# Patient Record
Sex: Male | Born: 1945
Health system: Southern US, Community
[De-identification: ages and names within clinical notes are randomized; demographics above are authoritative.]

## PROBLEM LIST (undated history)

## (undated) DIAGNOSIS — G4730 Sleep apnea, unspecified: Secondary | ICD-10-CM

## (undated) DIAGNOSIS — I10 Essential (primary) hypertension: Secondary | ICD-10-CM

## (undated) DIAGNOSIS — R011 Cardiac murmur, unspecified: Secondary | ICD-10-CM

## (undated) DIAGNOSIS — M1990 Unspecified osteoarthritis, unspecified site: Secondary | ICD-10-CM

## (undated) DIAGNOSIS — K219 Gastro-esophageal reflux disease without esophagitis: Secondary | ICD-10-CM

## (undated) DIAGNOSIS — I2510 Atherosclerotic heart disease of native coronary artery without angina pectoris: Secondary | ICD-10-CM

## (undated) DIAGNOSIS — E119 Type 2 diabetes mellitus without complications: Secondary | ICD-10-CM

## (undated) DIAGNOSIS — C9000 Multiple myeloma not having achieved remission: Secondary | ICD-10-CM

## (undated) DIAGNOSIS — E7800 Pure hypercholesterolemia, unspecified: Secondary | ICD-10-CM

## (undated) HISTORY — PX: TONSILLECTOMY: SUR1361

## (undated) HISTORY — PX: CORONARY ANGIOPLASTY: SHX604

---

## 1971-10-21 HISTORY — PX: SHOULDER SURGERY: SHX246

## 2006-09-27 ENCOUNTER — Emergency Department (HOSPITAL_COMMUNITY): Admission: AD | Admit: 2006-09-27 | Discharge: 2006-09-27 | Payer: Self-pay | Admitting: Family Medicine

## 2010-11-03 ENCOUNTER — Emergency Department (HOSPITAL_COMMUNITY)
Admission: EM | Admit: 2010-11-03 | Discharge: 2010-11-03 | Payer: Self-pay | Source: Home / Self Care | Admitting: Emergency Medicine

## 2010-11-04 LAB — URINALYSIS, ROUTINE W REFLEX MICROSCOPIC
Bilirubin Urine: NEGATIVE
Hgb urine dipstick: NEGATIVE
Ketones, ur: NEGATIVE mg/dL
Nitrite: NEGATIVE
Protein, ur: NEGATIVE mg/dL
Specific Gravity, Urine: 1.042 — ABNORMAL HIGH (ref 1.005–1.030)
Urine Glucose, Fasting: NEGATIVE mg/dL
Urobilinogen, UA: 0.2 mg/dL (ref 0.0–1.0)
pH: 5 (ref 5.0–8.0)

## 2010-11-04 LAB — POCT I-STAT, CHEM 8
BUN: 28 mg/dL — ABNORMAL HIGH (ref 6–23)
Calcium, Ion: 1.09 mmol/L — ABNORMAL LOW (ref 1.12–1.32)
Chloride: 112 mEq/L (ref 96–112)
Creatinine, Ser: 1.2 mg/dL (ref 0.4–1.2)
Glucose, Bld: 126 mg/dL — ABNORMAL HIGH (ref 70–99)
HCT: 45 % (ref 36.0–46.0)
Hemoglobin: 15.3 g/dL — ABNORMAL HIGH (ref 12.0–15.0)
Potassium: 4.3 mEq/L (ref 3.5–5.1)
Sodium: 139 mEq/L (ref 135–145)
TCO2: 19 mmol/L (ref 0–100)

## 2010-11-04 LAB — CBC
HCT: 41.9 % (ref 36.0–46.0)
Hemoglobin: 14.5 g/dL (ref 12.0–15.0)
MCH: 31.8 pg (ref 26.0–34.0)
MCHC: 34.6 g/dL (ref 30.0–36.0)
MCV: 91.9 fL (ref 78.0–100.0)
Platelets: 180 10*3/uL (ref 150–400)
RBC: 4.56 MIL/uL (ref 3.87–5.11)
RDW: 14.4 % (ref 11.5–15.5)
WBC: 5.6 10*3/uL (ref 4.0–10.5)

## 2010-11-04 LAB — TYPE AND SCREEN
ABO/RH(D): B POS
Antibody Screen: NEGATIVE

## 2010-11-04 LAB — PROTIME-INR
INR: 1.09 (ref 0.00–1.49)
Prothrombin Time: 14.3 seconds (ref 11.6–15.2)

## 2010-11-04 LAB — ABO/RH: ABO/RH(D): B POS

## 2015-07-08 ENCOUNTER — Emergency Department (HOSPITAL_COMMUNITY)
Admission: EM | Admit: 2015-07-08 | Discharge: 2015-07-08 | Disposition: A | Discharge status: Home / Self Care | Payer: Medicare HMO | Attending: Emergency Medicine | Admitting: Emergency Medicine

## 2015-07-08 ENCOUNTER — Encounter (HOSPITAL_COMMUNITY): Payer: Self-pay | Admitting: *Deleted

## 2015-07-08 DIAGNOSIS — I10 Essential (primary) hypertension: Secondary | ICD-10-CM | POA: Diagnosis not present

## 2015-07-08 DIAGNOSIS — K047 Periapical abscess without sinus: Secondary | ICD-10-CM | POA: Insufficient documentation

## 2015-07-08 DIAGNOSIS — E119 Type 2 diabetes mellitus without complications: Secondary | ICD-10-CM | POA: Diagnosis not present

## 2015-07-08 DIAGNOSIS — K088 Other specified disorders of teeth and supporting structures: Secondary | ICD-10-CM | POA: Diagnosis present

## 2015-07-08 HISTORY — DX: Essential (primary) hypertension: I10

## 2015-07-08 MED ORDER — PENICILLIN V POTASSIUM 500 MG PO TABS: 500.0000 mg | ORAL_TABLET | Freq: Three times a day (TID) | ORAL | Status: DC

## 2015-07-08 MED ORDER — HYDROCODONE-ACETAMINOPHEN 5-325 MG PO TABS: 1.0000 | ORAL_TABLET | ORAL | Status: DC | PRN

## 2015-07-08 MED ORDER — HYDROCODONE-ACETAMINOPHEN 5-325 MG PO TABS
1.0000 | ORAL_TABLET | Freq: Once | ORAL | Status: AC
  Administered 2015-07-08: 1 via ORAL
  Filled 2015-07-08: qty 1

## 2015-07-08 MED ORDER — PENICILLIN V POTASSIUM 250 MG PO TABS
500.0000 mg | ORAL_TABLET | Freq: Once | ORAL | Status: AC
  Administered 2015-07-08: 500 mg via ORAL
  Filled 2015-07-08: qty 2

## 2015-07-08 NOTE — ED Notes (Signed)
Pt c/o right jaw pain since last night. States he has a broken tooth on the right lower jaw. States he took ibuprofen, swished with colgate peroxide with no relief.

## 2015-07-08 NOTE — Discharge Instructions (Signed)
Dental Abscess °A dental abscess is a collection of infected fluid (pus) from a bacterial infection in the inner part of the tooth (pulp). It usually occurs at the end of the tooth's root.  °CAUSES  °· Severe tooth decay. °· Trauma to the tooth that allows bacteria to enter into the pulp, such as a broken or chipped tooth. °SYMPTOMS  °· Severe pain in and around the infected tooth. °· Swelling and redness around the abscessed tooth or in the mouth or face. °· Tenderness. °· Pus drainage. °· Bad breath. °· Bitter taste in the mouth. °· Difficulty swallowing. °· Difficulty opening the mouth. °· Nausea. °· Vomiting. °· Chills. °· Swollen neck glands. °DIAGNOSIS  °· A medical and dental history will be taken. °· An examination will be performed by tapping on the abscessed tooth. °· X-rays may be taken of the tooth to identify the abscess. °TREATMENT °The goal of treatment is to eliminate the infection. You may be prescribed antibiotic medicine to stop the infection from spreading. A root canal may be performed to save the tooth. If the tooth cannot be saved, it may be pulled (extracted) and the abscess may be drained.  °HOME CARE INSTRUCTIONS °· Only take over-the-counter or prescription medicines for pain, fever, or discomfort as directed by your caregiver. °· Rinse your mouth (gargle) often with salt water (¼ tsp salt in 8 oz [250 ml] of warm water) to relieve pain or swelling. °· Do not drive after taking pain medicine (narcotics). °· Do not apply heat to the outside of your face. °· Return to your dentist for further treatment as directed. °SEEK MEDICAL CARE IF: °· Your pain is not helped by medicine. °· Your pain is getting worse instead of better. °SEEK IMMEDIATE MEDICAL CARE IF: °· You have a fever or persistent symptoms for more than 2-3 days. °· You have a fever and your symptoms suddenly get worse. °· You have chills or a very bad headache. °· You have problems breathing or swallowing. °· You have trouble  opening your mouth. °· You have swelling in the neck or around the eye. °Document Released: 10/06/2005 Document Revised: 06/30/2012 Document Reviewed: 01/14/2011 °ExitCare® Patient Information ©2015 [employer]. This information is not intended to replace advice given to you by your health care provider. Make sure you discuss any questions you have with your health care provider. ° °Emergency Department Resource Guide °1) Find a Doctor and Pay Out of Pocket °Although you won't have to find out who is covered by your insurance plan, it is a good idea to ask around and get recommendations. You will then need to call the office and see if the doctor you have chosen will accept you as a new patient and what types of options they offer for patients who are self-pay. Some doctors offer discounts or will set up payment plans for their patients who do not have insurance, but you will need to ask so you aren't surprised when you get to your appointment. ° °2) Contact Your Local Health Department °Not all health departments have doctors that can see patients for sick visits, but many do, so it is worth a call to see if yours does. If you don't know where your local health department is, you can check in your phone book. The CDC also has a tool to help you locate your state's health department, and many state websites also have listings of all of their local health departments. ° °3) Find a Walk-in Clinic °  If your illness is not likely to be very severe or complicated, you may want to try a walk in clinic. These are popping up all over the country in pharmacies, drugstores, and shopping centers. They're usually staffed by nurse practitioners or physician assistants that have been trained to treat common illnesses and complaints. They're usually fairly quick and inexpensive. However, if you have serious medical issues or chronic medical problems, these are probably not your best option. ° °No Primary Care Doctor: °- Call  [facility] at  832-8000 - they can help you locate a primary care doctor that  accepts your insurance, provides certain services, etc. °- Physician Referral Service- 1-800-533-3463 ° °Chronic Pain Problems: °Organization         Address  Phone   Notes  °[facility]  ([phone]) [phone] Patients need to be referred by their primary care doctor.  ° °Medication Assistance: °Organization         Address  Phone   Notes  °[place] [facility] [address] °[place], [place] [address] (336) 641-8030 --Must be a resident of [place] °-- Must have NO insurance coverage whatsoever (no Medicaid/ Medicare, etc.) °-- The pt. MUST have a primary care doctor that directs their care regularly and follows them in the community °  °[facility]  (866) 331-1348   °[employer]  (888) 892-1162   ° °Agencies that provide inexpensive medical care: °Organization         Address  Phone   Notes  °[facility]  (336) 832-8035   °[facility]    (336) 832-7272   °[facility] [address] °[place], [place] [address] (336) 832-4777   °[facility] [address], °[place] (336) 271-4999   °[facility]    (336) 373-0678   °[facility]    (336) 272-1050   °[facility] ° [address], [place] Phone:  (336) 832-4444, Fax:  (336) 832-4440 Hours of Operation:  9 am - 6 pm, M-F.  Also accepts Medicaid/Medicare and self-pay.  °[facility] ° [address], [place] Phone: (336) [phone], Fax: (336) [phone]. Hours of Operation:  8:30 am - 5:30 pm, M-F.  Also accepts Medicaid and self-pay.  °[facility] [place] [address], [place] Phone: (336) 878-6027   °[facility] [address], [place], [place] (336)723-1848, Ext. 123 Mondays & Thursdays: 7-9 AM.  First 15 patients are seen on a first come, first serve  basis. °  ° °Medicaid-accepting [place] Providers: ° °Organization         Address  Phone   Notes  °[facility] [address], [place] (336) [phone] Also accepts self-pay patients.  °[facility] [address], [place] ° (336) [phone]   °[facility] [address], [place] (336) 288-8857   °[facility] [address] [place] [address], [place] (336) 299-7000   °[facility] [address], [place]  ° (336) [phone] Only accepts [place] Access Medicaid patients after they have their name applied to their card.  ° °Self-Pay (no insurance) in [place]: ° °Organization         Address  Phone   Notes  °Sickle Cell Patients, [facility] [address], [place] (336)   [phone]   °[facility] [address], [place] (336) [phone]   °[facility] ° [address], [place] (336) [phone]   °[facility]/Dr. Osei-Bonsu ° [address] [place] [address], [place] or [address], [place] (336) [phone] Phone number for both [place] and [place] locations is the same.  °[facility] [address], [place] (336) [phone]   °[facility] [address] [place] [address], [place] or [address] (336) 852-7530 °(336) 878-2260   °[facility] [address], [place] (336) 350-1642, phone; (336) 294-5005, fax Sees patients 1st and 3rd Saturday of every month.  Must not qualify for public or private insurance (i.e. Medicaid, Medicare, [place] Health Choice, Veterans' Benefits) • Household income should be no more than 200% of the poverty level •The clinic cannot treat you if you are pregnant or think you are pregnant • Sexually transmitted diseases are not treated at the clinic.  ° ° °Dental Care: °Organization         Address  Phone  Notes  °[place]  [facility] [address], [place] (336) 641-6152 Accepts children up to age 21 who are enrolled in Medicaid or [name] Health Choice; pregnant women with a Medicaid card; and children who have applied for Medicaid or [place] Health Choice, but were declined, whose parents can pay a reduced fee at time of service.  °[facility] [place]  [address], [place] (336) 641-7733 Accepts children up to age 21 who are enrolled in Medicaid or [place] Health Choice; pregnant women with a Medicaid card; and children who have applied for Medicaid or [place] Health Choice, but were declined, whose parents can pay a reduced fee at time of service.  °[facility] ° [address], [place] (336) 641-4533 Patients are seen by appointment only. Walk-ins are not accepted. [facility] will see patients 18 years of age and older. °Monday - Tuesday (8am-5pm) °Most Wednesdays (8:30-5pm) °$30 per visit, cash only  °[facility] ° [address], [place] (336) [phone] Patients are seen by appointment only. Walk-ins are not accepted. [facility] will see patients 18 years of age and older. °One Wednesday Evening (Monthly: Volunteer Based).  $30 per visit, cash only  °[facility]  (919) 537-3737 for adults; Children under age 4, call Graduate Pediatric Dentistry at (919) [phone]. Children aged 4-14, please call ([phone]) [phone] to request a pediatric application. ° Dental services are provided in all areas of dental care including fillings, crowns and bridges, complete and partial dentures, implants, gum treatment, root canals, and extractions. Preventive care is also provided. Treatment is provided to both adults and children. °Patients are selected via a lottery and there is often a waiting list. °  °[facility] [address], ° ° (336) 763-8833  [email or web address].[email or web address] °  °[facility] [address], [place], [place] (336)723-1848, Ext. 123 Second and Fourth Thursday of each month, opens at 6:30 AM; Clinic ends at 9 AM.  Patients are seen on a first-come first-served basis, and a limited number are seen during each clinic.  ° °[facility] ° [address], [place], [place] (336) [phone]   Eligibility Requirements °You must have lived in [place], [place], or [place] counties for   at least the last three months. °  You cannot be eligible for state or federal sponsored healthcare insurance, including Veterans Administration, Medicaid, or Medicare. °  You generally cannot be eligible for healthcare insurance through your employer.  °  How to apply: °Eligibility screenings are held every Tuesday and Wednesday afternoon from 1:00 pm until 4:00 pm. You do not need an appointment for the interview!  °[facility] [address], [place], [place] [phone]   °[facility]  [phone]   °[facility]  [phone]   °[facility]  [phone]   ° °

## 2015-07-08 NOTE — ED Provider Notes (Signed)
CSN: [id number]     Arrival date & time 07/08/15  2109 History  This chart was scribed for non-physician practitioner working with Charlesetta Shanks, MD by Ludger Nutting, ED Scribe. This patient was seen in room TR07C/TR07C and the patient's care was started at 9:46 PM.    Chief Complaint  Patient presents with  . Dental Pain    The history is provided by the patient. No language interpreter was used.     HPI Comments: Richard Vang is a 69 y.o. male with past medical history of DM who presents to the Emergency Department complaining of 1 day of gradual onset, gradually worsening, constant right lower dental pain with associated right facial swelling. He reports having a painful tooth to the affected area. He has taken ibuprofen and swished with H2O2 without relief. He denies fever, chills, discharge, drainage.   Past Medical History  Diagnosis Date  . Hypertension   . Diabetes mellitus without complication    Past Surgical History  Procedure Laterality Date  . Shoulder surgery     No family history on file. Social History  Substance Use Topics  . Smoking status: Never Smoker   . Smokeless tobacco: None  . Alcohol Use: No    Review of Systems  Constitutional: Negative for fever and chills.  HENT: Positive for dental problem and facial swelling. Negative for trouble swallowing.   Gastrointestinal: Negative for nausea.  Musculoskeletal: Negative for neck pain.  Neurological: Negative for headaches.    Allergies  Review of patient's allergies indicates no known allergies.  Home Medications   Prior to Admission medications   Not on File   BP 142/80 mmHg  Pulse 96  Temp(Src) 98.9 F (37.2 C) (Oral)  Resp 18  Ht 5\' 9"  (1.753 m)  Wt 233 lb (105.688 kg)  BMI 34.39 kg/m2  SpO2 95% Physical Exam  Constitutional: He is oriented to person, place, and time. He appears well-developed and well-nourished.  HENT:  Head: Normocephalic and atraumatic.  Mouth/Throat: Abnormal  dentition.  Right facial swelling along the mandibular line. Widespread dental decay. No pointing abscess. Oropharynx benign.   Cardiovascular: Normal rate.   Pulmonary/Chest: Effort normal.  Abdominal: He exhibits no distension.  Neurological: He is alert and oriented to person, place, and time.  Skin: Skin is warm and dry.  Psychiatric: He has a normal mood and affect.  Nursing note and vitals reviewed.   ED Course  Procedures (including critical care time)  DIAGNOSTIC STUDIES: Oxygen Saturation is 95% on RA, adequate by my interpretation.    COORDINATION OF CARE: 9:49 PM Discussed treatment plan with pt at bedside and pt agreed to plan.   Labs Review Labs Reviewed - No data to display  Imaging Review No results found.    EKG Interpretation None      MDM   Final diagnoses:  None    1. Dental abscess  Uncomplicated dental abscess and widespread dental decay. Will provide dental resource list for outpatient follow up.  I personally performed the services described in this documentation, which was scribed in my presence. The recorded information has been reviewed and is accurate.      Charlann Lange, PA-C 07/09/15 0115  Charlesetta Shanks, MD 07/09/15 [phone]

## 2016-07-10 ENCOUNTER — Telehealth: Payer: Self-pay | Admitting: Cardiovascular Disease

## 2016-07-10 NOTE — Telephone Encounter (Signed)
Received records from [facility] for appointment on 07/18/16 with Dr Gwenlyn Found.  Records given to [facility] (medical records) for Dr Kennon Holter schedule on 0/29/17. lp

## 2016-07-18 ENCOUNTER — Telehealth: Payer: Self-pay | Admitting: *Deleted

## 2016-07-18 ENCOUNTER — Other Ambulatory Visit: Payer: Self-pay | Admitting: *Deleted

## 2016-07-18 ENCOUNTER — Ambulatory Visit (INDEPENDENT_AMBULATORY_CARE_PROVIDER_SITE_OTHER): Payer: Medicare HMO | Admitting: Cardiovascular Disease

## 2016-07-18 ENCOUNTER — Encounter: Payer: Self-pay | Admitting: Cardiovascular Disease

## 2016-07-18 VITALS — BP 150/92 | HR 77 | Ht 70.0 in | Wt 253.4 lb

## 2016-07-18 DIAGNOSIS — I482 Chronic atrial fibrillation, unspecified: Secondary | ICD-10-CM | POA: Insufficient documentation

## 2016-07-18 DIAGNOSIS — E785 Hyperlipidemia, unspecified: Secondary | ICD-10-CM | POA: Insufficient documentation

## 2016-07-18 DIAGNOSIS — I255 Ischemic cardiomyopathy: Secondary | ICD-10-CM | POA: Insufficient documentation

## 2016-07-18 DIAGNOSIS — R931 Abnormal findings on diagnostic imaging of heart and coronary circulation: Secondary | ICD-10-CM

## 2016-07-18 DIAGNOSIS — Z7901 Long term (current) use of anticoagulants: Secondary | ICD-10-CM | POA: Diagnosis not present

## 2016-07-18 DIAGNOSIS — R0609 Other forms of dyspnea: Secondary | ICD-10-CM

## 2016-07-18 DIAGNOSIS — R9439 Abnormal result of other cardiovascular function study: Secondary | ICD-10-CM

## 2016-07-18 DIAGNOSIS — E119 Type 2 diabetes mellitus without complications: Secondary | ICD-10-CM | POA: Insufficient documentation

## 2016-07-18 DIAGNOSIS — I10 Essential (primary) hypertension: Secondary | ICD-10-CM

## 2016-07-18 DIAGNOSIS — I519 Heart disease, unspecified: Secondary | ICD-10-CM

## 2016-07-18 LAB — CBC WITH DIFFERENTIAL/PLATELET
Basophils Absolute: 0 cells/uL (ref 0–200)
Basophils Relative: 0 %
Eosinophils Absolute: 47 cells/uL (ref 15–500)
Eosinophils Relative: 1 %
HCT: 36.1 % — ABNORMAL LOW (ref 38.5–50.0)
Hemoglobin: 11.7 g/dL — ABNORMAL LOW (ref 13.2–17.1)
Lymphocytes Relative: 22 %
Lymphs Abs: 1034 cells/uL (ref 850–3900)
MCH: 30.7 pg (ref 27.0–33.0)
MCHC: 32.4 g/dL (ref 32.0–36.0)
MCV: 94.8 fL (ref 80.0–100.0)
MPV: 11.4 fL (ref 7.5–12.5)
Monocytes Absolute: 564 cells/uL (ref 200–950)
Monocytes Relative: 12 %
Neutro Abs: 3055 cells/uL (ref 1500–7800)
Neutrophils Relative %: 65 %
Platelets: 132 10*3/uL — ABNORMAL LOW (ref 140–400)
RBC: 3.81 MIL/uL — ABNORMAL LOW (ref 4.20–5.80)
RDW: 16.1 % — ABNORMAL HIGH (ref 11.0–15.0)
WBC: 4.7 10*3/uL (ref 3.8–10.8)

## 2016-07-18 LAB — APTT: aPTT: 31 s (ref 22–34)

## 2016-07-18 LAB — BASIC METABOLIC PANEL
BUN: 21 mg/dL (ref 7–25)
CO2: 25 mmol/L (ref 20–31)
Calcium: 9 mg/dL (ref 8.6–10.3)
Chloride: 110 mmol/L (ref 98–110)
Creat: 1.26 mg/dL — ABNORMAL HIGH (ref 0.70–1.25)
Glucose, Bld: 108 mg/dL — ABNORMAL HIGH (ref 65–99)
Potassium: 4.4 mmol/L (ref 3.5–5.3)
Sodium: 143 mmol/L (ref 135–146)

## 2016-07-18 LAB — TSH: TSH: 2.01 mIU/L (ref 0.40–4.50)

## 2016-07-18 LAB — PROTIME-INR
INR: 1.7 — ABNORMAL HIGH
Prothrombin Time: 17.8 s — ABNORMAL HIGH (ref 9.0–11.5)

## 2016-07-18 NOTE — Assessment & Plan Note (Signed)
History of hyperlipidemia on statin therapy followed by his PCP 

## 2016-07-18 NOTE — Assessment & Plan Note (Signed)
The patient has been complaining of increasing dyspnea on exertion for the last 6-8 weeks. He denies chest pain. A Myoview stress test showed left jugular dysfunction with an ejection fraction of 40%.

## 2016-07-18 NOTE — Assessment & Plan Note (Signed)
History of chronic atrial fibrillation rate controlled on Coumadin and a coagulation. 

## 2016-07-18 NOTE — Progress Notes (Signed)
07/18/2016 Richard Vang   Jul 14, 1946  [id number]  Primary Physician Sandi Mariscal, MD Primary Cardiologist: Lorretta Harp MD Renae Gloss  HPI:  Richard Vang is a 70 year old moderate to severely overweight married Caucasian male father of 2 living children (1 deceased), gram-positive 5 grandchildren referred by Dr. Claudie Leach for cardiac catheterization. He is retired working part-time at [employer] in [place] as well as sleeping at night. His risk factors include treated hypertension, diabetes and hyperlipidemia. His father did die of a myocardial infarction at age 31. He has never had a heart attack or stroke. She said increasing dyspnea on exertion for the last 4-6 weeks. Recent Myoview stress test showed LV dysfunction with an EF of 40% and mild anterior ischemia.   Current Outpatient Prescriptions  Medication Sig Dispense Refill  . atorvastatin (LIPITOR) 10 MG tablet     . carvedilol (COREG) 12.5 MG tablet     . Cholecalciferol (VITAMIN D3) 5000 units CAPS Take by mouth.    [name] lisinopril (PRINIVIL,ZESTRIL) 20 MG tablet     . metFORMIN (GLUCOPHAGE-XR) 500 MG 24 hr tablet     . omeprazole (PRILOSEC) 40 MG capsule     . quinapril (ACCUPRIL) 20 MG tablet     . warfarin (COUMADIN) 6 MG tablet      No current facility-administered medications for this visit.     No Known Allergies  Social History   Social History  . Marital status: Single    Spouse name: N/A  . Number of children: N/A  . Years of education: N/A   Occupational History  . Not on file.   Social History Main Topics  . Smoking status: Never Smoker  . Smokeless tobacco: Not on file  . Alcohol use No  . Drug use: No  . Sexual activity: Not on file   Other Topics Concern  . Not on file   Social History Narrative  . No narrative on file     Review of Systems: General: negative for chills, fever, night sweats or weight changes.  Cardiovascular: negative for chest pain, dyspnea on exertion,  edema, orthopnea, palpitations, paroxysmal nocturnal dyspnea or shortness of breath Dermatological: negative for rash Respiratory: negative for cough or wheezing Urologic: negative for hematuria Abdominal: negative for nausea, vomiting, diarrhea, bright red blood per rectum, melena, or hematemesis Neurologic: negative for visual changes, syncope, or dizziness All other systems reviewed and are otherwise negative except as noted above.    Blood pressure (!) 150/92, pulse 77, height 5\' 10"  (1.778 m), weight 253 lb 6.4 oz (114.9 kg).  General appearance: alert and no distress Neck: no adenopathy, no carotid bruit, no JVD, supple, symmetrical, trachea midline and thyroid not enlarged, symmetric, no tenderness/mass/nodules Lungs: clear to auscultation bilaterally Heart: irregularly irregular rhythm Extremities: extremities normal, atraumatic, no cyanosis or edema  EKG atrial fibrillation with ventricular response of 77 and septal Q waves. I personally reviewed his EKG  ASSESSMENT AND PLAN:   Essential hypertension History of hypertension with blood pressure measured today at 150/92. He is on carvedilol, quinapril and lisinopril. Continue current meds at current dosing  Hyperlipidemia History of hyperlipidemia on statin therapy followed by his PCP  Chronic atrial fibrillation (HCC) History of chronic atrial fibrillation rate controlled on Coumadin and a coagulation  Dyspnea on exertion The patient has been complaining of increasing dyspnea on exertion for the last 6-8 weeks. He denies chest pain. A Myoview stress test showed left jugular dysfunction with an ejection  fraction of 40%.  Left ventricular dysfunction Recent diagnosis of left ventricular dysfunction by my view stress test with an EF of 40%. The patient has had increasing dyspnea on exertion. Myocardial perfusion showed nontransmural scar with mild anterior wall ischemia.  Abnormal nuclear stress test The patient was referred  by Dr. Claudie Leach for an abnormal Myoview stress test performed 07/08/16 for the diagnosis of this being on exertion. His EF was 40% and he had mild apical nontransmural scar with mild anterior ischemia. Based on this I decided to proceed with outpatient radial diagnostic coronary angiography. We will need to hold his Coumadin for 4 days prior to the procedure.  I have reviewed the risks, indications, and alternatives to cardiac catheterization, possible angioplasty, and stenting with the patient. Risks include but are not limited to bleeding, infection, vascular injury, stroke, myocardial infection, arrhythmia, kidney injury, radiation-related injury in the case of prolonged fluoroscopy use, emergency cardiac surgery, and death. The patient understands the risks of serious complication is 1-2 in [id number] with diagnostic cardiac cath and 1-2% or less with angioplasty/stenting.       Lorretta Harp MD FACP,FACC,FAHA, [facility] 07/18/2016 11:17 AM

## 2016-07-18 NOTE — Assessment & Plan Note (Signed)
Recent diagnosis of left ventricular dysfunction by my view stress test with an EF of 40%. The patient has had increasing dyspnea on exertion. Myocardial perfusion showed nontransmural scar with mild anterior wall ischemia.

## 2016-07-18 NOTE — Patient Instructions (Signed)
Medication Instructions:  NO CHANGES.  PRIOR TO PROCEDURE: 1- STOP TAKING COUMADIN 4 DAYS PRIOR TO PROCEDURE. You will be instructed on when to start back after the procedure.   Testing/Procedures: Your physician has requested that you have a cardiac catheterization. Cardiac catheterization is used to diagnose and/or treat various heart conditions. Doctors may recommend this procedure for a number of different reasons. The most common reason is to evaluate chest pain. Chest pain can be a symptom of coronary artery disease (CAD), and cardiac catheterization can show whether plaque is narrowing or blocking your heart's arteries. This procedure is also used to evaluate the valves, as well as measure the blood flow and oxygen levels in different parts of your heart. For further information please visit [email or web address].   Following your catheterization, you will not be allowed to drive for 3 days.  No lifting, pushing, or pulling greater that 10 pounds is allowed for 1 week.  You will be required to have the following tests prior to the procedure:  1. Blood work-the blood work can be done no more than 14 days prior to the procedure.  It can be done at any [facility] lab.  There is one downstairs on the first floor of this building and one in the [facility] building [id number]).  2. Chest Xray-the chest xray order has already been placed at the [facility].      Puncture site  RIGHT RADIAL   If you need a refill on your cardiac medications before your next appointment, please call your pharmacy.

## 2016-07-18 NOTE — Telephone Encounter (Signed)
Received CXR from June 24, 2016 from [facility]. Impression: Findings consistant with congestive heart failure. Dr Gwenlyn Found reviewed and signed off that cxr adequate for upcoming heart cath.

## 2016-07-18 NOTE — Assessment & Plan Note (Signed)
The patient was referred by Dr. Claudie Leach for an abnormal Myoview stress test performed 07/08/16 for the diagnosis of this being on exertion. His EF was 40% and he had mild apical nontransmural scar with mild anterior ischemia. Based on this I decided to proceed with outpatient radial diagnostic coronary angiography. We will need to hold his Coumadin for 4 days prior to the procedure.  I have reviewed the risks, indications, and alternatives to cardiac catheterization, possible angioplasty, and stenting with the patient. Risks include but are not limited to bleeding, infection, vascular injury, stroke, myocardial infection, arrhythmia, kidney injury, radiation-related injury in the case of prolonged fluoroscopy use, emergency cardiac surgery, and death. The patient understands the risks of serious complication is 1-2 in 123XX123 with diagnostic cardiac cath and 1-2% or less with angioplasty/stenting.

## 2016-07-18 NOTE — Assessment & Plan Note (Signed)
History of hypertension with blood pressure measured today at 150/92. He is on carvedilol, quinapril and lisinopril. Continue current meds at current dosing

## 2016-07-31 ENCOUNTER — Encounter (HOSPITAL_COMMUNITY): Payer: Self-pay | Admitting: *Deleted

## 2016-07-31 ENCOUNTER — Ambulatory Visit (HOSPITAL_COMMUNITY)
Admission: RE | Admit: 2016-07-31 | Discharge: 2016-08-01 | Disposition: A | Discharge status: Home / Self Care | Payer: Medicare HMO | Source: Ambulatory Visit | Attending: Cardiovascular Disease | Admitting: Cardiovascular Disease

## 2016-07-31 ENCOUNTER — Other Ambulatory Visit: Payer: Self-pay

## 2016-07-31 ENCOUNTER — Encounter (HOSPITAL_COMMUNITY)
Admission: RE | Disposition: A | Discharge status: Home / Self Care | Payer: Self-pay | Source: Ambulatory Visit | Attending: Cardiovascular Disease

## 2016-07-31 DIAGNOSIS — I2510 Atherosclerotic heart disease of native coronary artery without angina pectoris: Secondary | ICD-10-CM

## 2016-07-31 DIAGNOSIS — E663 Overweight: Secondary | ICD-10-CM | POA: Diagnosis not present

## 2016-07-31 DIAGNOSIS — I255 Ischemic cardiomyopathy: Secondary | ICD-10-CM | POA: Diagnosis not present

## 2016-07-31 DIAGNOSIS — Z7901 Long term (current) use of anticoagulants: Secondary | ICD-10-CM | POA: Diagnosis not present

## 2016-07-31 DIAGNOSIS — I10 Essential (primary) hypertension: Secondary | ICD-10-CM | POA: Insufficient documentation

## 2016-07-31 DIAGNOSIS — R0609 Other forms of dyspnea: Secondary | ICD-10-CM | POA: Diagnosis not present

## 2016-07-31 DIAGNOSIS — I2583 Coronary atherosclerosis due to lipid rich plaque: Secondary | ICD-10-CM | POA: Diagnosis not present

## 2016-07-31 DIAGNOSIS — I482 Chronic atrial fibrillation: Secondary | ICD-10-CM | POA: Insufficient documentation

## 2016-07-31 DIAGNOSIS — Z8249 Family history of ischemic heart disease and other diseases of the circulatory system: Secondary | ICD-10-CM | POA: Diagnosis not present

## 2016-07-31 DIAGNOSIS — Z6838 Body mass index (BMI) 38.0-38.9, adult: Secondary | ICD-10-CM | POA: Diagnosis not present

## 2016-07-31 DIAGNOSIS — E119 Type 2 diabetes mellitus without complications: Secondary | ICD-10-CM | POA: Insufficient documentation

## 2016-07-31 DIAGNOSIS — R600 Localized edema: Secondary | ICD-10-CM | POA: Diagnosis not present

## 2016-07-31 DIAGNOSIS — Z7984 Long term (current) use of oral hypoglycemic drugs: Secondary | ICD-10-CM | POA: Diagnosis not present

## 2016-07-31 DIAGNOSIS — Z9861 Coronary angioplasty status: Secondary | ICD-10-CM

## 2016-07-31 DIAGNOSIS — Z955 Presence of coronary angioplasty implant and graft: Secondary | ICD-10-CM

## 2016-07-31 DIAGNOSIS — E785 Hyperlipidemia, unspecified: Secondary | ICD-10-CM | POA: Insufficient documentation

## 2016-07-31 DIAGNOSIS — R9439 Abnormal result of other cardiovascular function study: Secondary | ICD-10-CM

## 2016-07-31 HISTORY — DX: Unspecified osteoarthritis, unspecified site: M19.90

## 2016-07-31 HISTORY — PX: CARDIAC CATHETERIZATION: SHX172

## 2016-07-31 HISTORY — DX: Gastro-esophageal reflux disease without esophagitis: K21.9

## 2016-07-31 HISTORY — DX: Sleep apnea, unspecified: G47.30

## 2016-07-31 HISTORY — DX: Type 2 diabetes mellitus without complications: E11.9

## 2016-07-31 HISTORY — DX: Pure hypercholesterolemia, unspecified: E78.00

## 2016-07-31 HISTORY — DX: Atherosclerotic heart disease of native coronary artery without angina pectoris: I25.10

## 2016-07-31 HISTORY — DX: Cardiac murmur, unspecified: R01.1

## 2016-07-31 LAB — GLUCOSE, CAPILLARY
GLUCOSE-CAPILLARY: 113 mg/dL — AB (ref 65–99)
Glucose-Capillary: 128 mg/dL — ABNORMAL HIGH (ref 65–99)
Glucose-Capillary: 109 mg/dL — ABNORMAL HIGH (ref 65–99)
Glucose-Capillary: 97 mg/dL (ref 65–99)
Glucose-Capillary: 101 mg/dL — ABNORMAL HIGH (ref 65–99)

## 2016-07-31 LAB — PROTIME-INR
INR: 1.27
PROTHROMBIN TIME: 16 s — AB (ref 11.4–15.2)

## 2016-07-31 LAB — POCT ACTIVATED CLOTTING TIME: Activated Clotting Time: 499 seconds

## 2016-07-31 SURGERY — LEFT HEART CATH AND CORONARY ANGIOGRAPHY
Anesthesia: LOCAL

## 2016-07-31 MED ORDER — ONDANSETRON HCL 4 MG/2ML IJ SOLN: 4.0000 mg | Freq: Once | INTRAMUSCULAR | Status: DC

## 2016-07-31 MED ORDER — SODIUM CHLORIDE 0.9% FLUSH
3.0000 mL | Freq: Two times a day (BID) | INTRAVENOUS | Status: DC
  Administered 2016-07-31 – 2016-08-01 (×2): 3 mL via INTRAVENOUS

## 2016-07-31 MED ORDER — ATORVASTATIN CALCIUM 10 MG PO TABS: 10.0000 mg | ORAL_TABLET | Freq: Every day | ORAL | Status: DC

## 2016-07-31 MED ORDER — VITAMIN D 1000 UNITS PO TABS
5000.0000 [IU] | ORAL_TABLET | Freq: Every day | ORAL | Status: DC
  Administered 2016-08-01: 5000 [IU] via ORAL
  Filled 2016-07-31 (×2): qty 5

## 2016-07-31 MED ORDER — SODIUM CHLORIDE 0.9 % IV SOLN: 250.0000 mL | INTRAVENOUS | Status: DC | PRN

## 2016-07-31 MED ORDER — FAMOTIDINE IN NACL 20-0.9 MG/50ML-% IV SOLN
INTRAVENOUS | Status: AC
  Filled 2016-07-31: qty 50

## 2016-07-31 MED ORDER — CLOPIDOGREL BISULFATE 75 MG PO TABS
75.0000 mg | ORAL_TABLET | Freq: Every day | ORAL | Status: DC
  Administered 2016-08-01: 75 mg via ORAL
  Filled 2016-07-31: qty 1

## 2016-07-31 MED ORDER — HEPARIN (PORCINE) IN NACL 2-0.9 UNIT/ML-% IJ SOLN
INTRAMUSCULAR | Status: DC | PRN
  Administered 2016-07-31: 12:00:00

## 2016-07-31 MED ORDER — SODIUM CHLORIDE 0.9 % WEIGHT BASED INFUSION
3.0000 mL/kg/h | INTRAVENOUS | Status: DC
  Administered 2016-07-31: 3 mL/kg/h via INTRAVENOUS

## 2016-07-31 MED ORDER — SODIUM CHLORIDE 0.9 % IV SOLN: 1.7500 mg/kg/h | INTRAVENOUS | Status: AC

## 2016-07-31 MED ORDER — ONDANSETRON HCL 4 MG/2ML IJ SOLN
INTRAMUSCULAR | Status: AC
  Filled 2016-07-31: qty 2

## 2016-07-31 MED ORDER — SODIUM CHLORIDE 0.9% FLUSH: 3.0000 mL | INTRAVENOUS | Status: DC | PRN

## 2016-07-31 MED ORDER — IOPAMIDOL (ISOVUE-370) INJECTION 76%
INTRAVENOUS | Status: AC
  Filled 2016-07-31: qty 100

## 2016-07-31 MED ORDER — VERAPAMIL HCL 2.5 MG/ML IV SOLN
INTRA_ARTERIAL | Status: DC | PRN
  Administered 2016-07-31: 11:00:00 via INTRA_ARTERIAL

## 2016-07-31 MED ORDER — SODIUM CHLORIDE 0.9 % WEIGHT BASED INFUSION: 1.0000 mL/kg/h | INTRAVENOUS | Status: AC

## 2016-07-31 MED ORDER — ACETAMINOPHEN 325 MG PO TABS: 650.0000 mg | ORAL_TABLET | ORAL | Status: DC | PRN

## 2016-07-31 MED ORDER — MORPHINE SULFATE (PF) 2 MG/ML IV SOLN: 2.0000 mg | INTRAVENOUS | Status: DC | PRN

## 2016-07-31 MED ORDER — CLOPIDOGREL BISULFATE 300 MG PO TABS
ORAL_TABLET | ORAL | Status: AC
  Filled 2016-07-31: qty 1

## 2016-07-31 MED ORDER — ONDANSETRON HCL 4 MG/2ML IJ SOLN: 4.0000 mg | Freq: Four times a day (QID) | INTRAMUSCULAR | Status: DC | PRN

## 2016-07-31 MED ORDER — SODIUM CHLORIDE 0.9 % IV SOLN
INTRAVENOUS | Status: DC | PRN
  Administered 2016-07-31 (×2): 1.75 mg/kg/h via INTRAVENOUS

## 2016-07-31 MED ORDER — BIVALIRUDIN 250 MG IV SOLR
INTRAVENOUS | Status: AC
  Filled 2016-07-31: qty 250

## 2016-07-31 MED ORDER — BIVALIRUDIN BOLUS VIA INFUSION - CUPID
INTRAVENOUS | Status: DC | PRN
  Administered 2016-07-31: 85.05 mg via INTRAVENOUS

## 2016-07-31 MED ORDER — CARVEDILOL 12.5 MG PO TABS
12.5000 mg | ORAL_TABLET | Freq: Two times a day (BID) | ORAL | Status: DC
  Administered 2016-07-31 – 2016-08-01 (×2): 12.5 mg via ORAL
  Filled 2016-07-31 (×2): qty 1

## 2016-07-31 MED ORDER — VERAPAMIL HCL 2.5 MG/ML IV SOLN
INTRAVENOUS | Status: AC
  Filled 2016-07-31: qty 2

## 2016-07-31 MED ORDER — FAMOTIDINE IN NACL 20-0.9 MG/50ML-% IV SOLN
20.0000 mg | Freq: Once | INTRAVENOUS | Status: AC
  Administered 2016-07-31: 20 mg via INTRAVENOUS

## 2016-07-31 MED ORDER — HEPARIN (PORCINE) IN NACL 2-0.9 UNIT/ML-% IJ SOLN
INTRAMUSCULAR | Status: AC
  Filled 2016-07-31: qty 1000

## 2016-07-31 MED ORDER — LISINOPRIL 10 MG PO TABS: 20.0000 mg | ORAL_TABLET | ORAL | Status: DC

## 2016-07-31 MED ORDER — IOPAMIDOL (ISOVUE-370) INJECTION 76%
INTRAVENOUS | Status: DC | PRN
  Administered 2016-07-31: 160 mL

## 2016-07-31 MED ORDER — IOPAMIDOL (ISOVUE-370) INJECTION 76%
INTRAVENOUS | Status: AC
  Filled 2016-07-31: qty 50

## 2016-07-31 MED ORDER — NITROGLYCERIN 1 MG/10 ML FOR IR/CATH LAB
INTRA_ARTERIAL | Status: AC
  Filled 2016-07-31: qty 10

## 2016-07-31 MED ORDER — HEPARIN SODIUM (PORCINE) 1000 UNIT/ML IJ SOLN
INTRAMUSCULAR | Status: AC
  Filled 2016-07-31: qty 1

## 2016-07-31 MED ORDER — LIDOCAINE HCL (PF) 1 % IJ SOLN
INTRAMUSCULAR | Status: DC | PRN
  Administered 2016-07-31: 2 mL via SUBCUTANEOUS

## 2016-07-31 MED ORDER — ASPIRIN 81 MG PO CHEW
CHEWABLE_TABLET | ORAL | Status: AC
  Administered 2016-07-31: 81 mg via ORAL
  Filled 2016-07-31: qty 1

## 2016-07-31 MED ORDER — ANGIOPLASTY BOOK
Freq: Once | Status: AC
  Administered 2016-07-31: 20:00:00
  Filled 2016-07-31: qty 1

## 2016-07-31 MED ORDER — ASPIRIN 81 MG PO CHEW
81.0000 mg | CHEWABLE_TABLET | Freq: Every day | ORAL | Status: DC
  Administered 2016-08-01: 09:00:00 81 mg via ORAL
  Filled 2016-07-31: qty 1

## 2016-07-31 MED ORDER — HEPARIN SODIUM (PORCINE) 1000 UNIT/ML IJ SOLN
INTRAMUSCULAR | Status: DC | PRN
  Administered 2016-07-31: 5000 [IU] via INTRAVENOUS

## 2016-07-31 MED ORDER — SODIUM CHLORIDE 0.9 % WEIGHT BASED INFUSION: 1.0000 mL/kg/h | INTRAVENOUS | Status: DC

## 2016-07-31 MED ORDER — LIDOCAINE HCL (PF) 1 % IJ SOLN
INTRAMUSCULAR | Status: AC
  Filled 2016-07-31: qty 30

## 2016-07-31 MED ORDER — ASPIRIN 81 MG PO CHEW
81.0000 mg | CHEWABLE_TABLET | ORAL | Status: AC
  Administered 2016-07-31: 81 mg via ORAL

## 2016-07-31 MED ORDER — CLOPIDOGREL BISULFATE 300 MG PO TABS
ORAL_TABLET | ORAL | Status: DC | PRN
  Administered 2016-07-31: 600 mg via ORAL

## 2016-07-31 MED ORDER — QUINAPRIL HCL 10 MG PO TABS
20.0000 mg | ORAL_TABLET | ORAL | Status: DC
  Administered 2016-08-01: 07:00:00 20 mg via ORAL
  Filled 2016-07-31: qty 2

## 2016-07-31 SURGICAL SUPPLY — 19 items
BALLN ANGIOSCULPT RX 2.0X10 (BALLOONS) ×2
BALLN ~~LOC~~ TREK RX 3.25X12 (BALLOONS) ×2
BALLOON ANGIOSCULPT RX 2.0X10 (BALLOONS) ×1 IMPLANT
BALLOON ~~LOC~~ TREK RX 3.25X12 (BALLOONS) ×1 IMPLANT
CATH INFINITI 5FR ANG PIGTAIL (CATHETERS) ×2 IMPLANT
CATH OPTITORQUE TIG 4.0 5F (CATHETERS) ×2 IMPLANT
CATH VISTA GUIDE 6FR XBLAD3.5 (CATHETERS) ×2 IMPLANT
DEVICE RAD COMP TR BAND LRG (VASCULAR PRODUCTS) ×2 IMPLANT
GLIDESHEATH SLEND A-KIT 6F 22G (SHEATH) ×2 IMPLANT
KIT ENCORE 26 ADVANTAGE (KITS) ×2 IMPLANT
KIT HEART LEFT (KITS) ×2 IMPLANT
PACK CARDIAC CATHETERIZATION (CUSTOM PROCEDURE TRAY) ×2 IMPLANT
STENT SYNERGY DES 3X16 (Permanent Stent) ×2 IMPLANT
SYR MEDRAD MARK V 150ML (SYRINGE) ×2 IMPLANT
TRANSDUCER W/STOPCOCK (MISCELLANEOUS) ×2 IMPLANT
TUBING CIL FLEX 10 FLL-RA (TUBING) ×2 IMPLANT
WIRE ASAHI PROWATER 180CM (WIRE) ×2 IMPLANT
WIRE HI TORQ VERSACORE-J 145CM (WIRE) ×2 IMPLANT
WIRE SAFE-T 1.5MM-J .035X260CM (WIRE) ×2 IMPLANT

## 2016-07-31 NOTE — H&P (View-Only) (Signed)
07/18/2016 Richard Vang   [birth year]  [id number]  Primary Physician Sandi Mariscal, MD Primary Cardiologist: Lorretta Harp MD Renae Gloss  HPI:  [name] is a 70 year old moderate to severely overweight married Caucasian male father of 2 living children (1 deceased), gram-positive 5 grandchildren referred by Dr. Claudie Leach for cardiac catheterization. He is retired working part-time at [employer] in [employer] as well as sleeping at night. His risk factors include treated hypertension, diabetes and hyperlipidemia. His father did die of a myocardial infarction at age 80. He has never had a heart attack or stroke. She said increasing dyspnea on exertion for the last 4-6 weeks. Recent Myoview stress test showed LV dysfunction with an EF of 40% and mild anterior ischemia.   Current Outpatient Prescriptions  Medication Sig Dispense Refill  . atorvastatin (LIPITOR) 10 MG tablet     . carvedilol (COREG) 12.5 MG tablet     . Cholecalciferol (VITAMIN D3) 5000 units CAPS Take by mouth.    [name] lisinopril (PRINIVIL,ZESTRIL) 20 MG tablet     . metFORMIN (GLUCOPHAGE-XR) 500 MG 24 hr tablet     . omeprazole (PRILOSEC) 40 MG capsule     . quinapril (ACCUPRIL) 20 MG tablet     . warfarin (COUMADIN) 6 MG tablet      No current facility-administered medications for this visit.     No Known Allergies  Social History   Social History  . Marital status: Single    Spouse name: N/A  . Number of children: N/A  . Years of education: N/A   Occupational History  . Not on file.   Social History Main Topics  . Smoking status: Never Smoker  . Smokeless tobacco: Not on file  . Alcohol use No  . Drug use: No  . Sexual activity: Not on file   Other Topics Concern  . Not on file   Social History Narrative  . No narrative on file     Review of Systems: General: negative for chills, fever, night sweats or weight changes.  Cardiovascular: negative for chest pain, dyspnea on exertion,  edema, orthopnea, palpitations, paroxysmal nocturnal dyspnea or shortness of breath Dermatological: negative for rash Respiratory: negative for cough or wheezing Urologic: negative for hematuria Abdominal: negative for nausea, vomiting, diarrhea, bright red blood per rectum, melena, or hematemesis Neurologic: negative for visual changes, syncope, or dizziness All other systems reviewed and are otherwise negative except as noted above.    Blood pressure (!) 150/92, pulse 77, height 5\' 10"  (1.778 m), weight 253 lb 6.4 oz (114.9 kg).  General appearance: alert and no distress Neck: no adenopathy, no carotid bruit, no JVD, supple, symmetrical, trachea midline and thyroid not enlarged, symmetric, no tenderness/mass/nodules Lungs: clear to auscultation bilaterally Heart: irregularly irregular rhythm Extremities: extremities normal, atraumatic, no cyanosis or edema  EKG atrial fibrillation with ventricular response of 77 and septal Q waves. I personally reviewed his EKG  ASSESSMENT AND PLAN:   Essential hypertension History of hypertension with blood pressure measured today at 150/92. He is on carvedilol, quinapril and lisinopril. Continue current meds at current dosing  Hyperlipidemia History of hyperlipidemia on statin therapy followed by his PCP  Chronic atrial fibrillation (HCC) History of chronic atrial fibrillation rate controlled on Coumadin and a coagulation  Dyspnea on exertion The patient has been complaining of increasing dyspnea on exertion for the last 6-8 weeks. He denies chest pain. A Myoview stress test showed left jugular dysfunction with an ejection  fraction of 40%.  Left ventricular dysfunction Recent diagnosis of left ventricular dysfunction by my view stress test with an EF of 40%. The patient has had increasing dyspnea on exertion. Myocardial perfusion showed nontransmural scar with mild anterior wall ischemia.  Abnormal nuclear stress test The patient was referred  by Dr. Claudie Leach for an abnormal Myoview stress test performed 07/08/16 for the diagnosis of this being on exertion. His EF was 40% and he had mild apical nontransmural scar with mild anterior ischemia. Based on this I decided to proceed with outpatient radial diagnostic coronary angiography. We will need to hold his Coumadin for 4 days prior to the procedure.  I have reviewed the risks, indications, and alternatives to cardiac catheterization, possible angioplasty, and stenting with the patient. Risks include but are not limited to bleeding, infection, vascular injury, stroke, myocardial infection, arrhythmia, kidney injury, radiation-related injury in the case of prolonged fluoroscopy use, emergency cardiac surgery, and death. The patient understands the risks of serious complication is 1-2 in [id number] with diagnostic cardiac cath and 1-2% or less with angioplasty/stenting.       Lorretta Harp MD FACP,FACC,FAHA, [facility] 07/18/2016 11:17 AM

## 2016-07-31 NOTE — Progress Notes (Signed)
Report given to Wendall Papa RN and pt transported on monitor to [phone].

## 2016-07-31 NOTE — Progress Notes (Signed)
Pt care received from [facility] who administered 4mg  IV Zofran for nausea.  Pt states nausea subsided.

## 2016-07-31 NOTE — Progress Notes (Signed)
TR BAND REMOVAL  LOCATION:    right radial  DEFLATED PER PROTOCOL:    Yes.    TIME BAND OFF / DRESSING APPLIED:    1645   SITE UPON ARRIVAL:    Level 0  SITE AFTER BAND REMOVAL:    Level 1  CIRCULATION SENSATION AND MOVEMENT:    Within Normal Limits   Yes.    COMMENTS:   TOLERATED PROCEDURE WELL, POST TRB INSTRUCTIONS GIVEN

## 2016-07-31 NOTE — Interval H&P Note (Signed)
Cath Lab Visit (complete for each Cath Lab visit)  Clinical Evaluation Leading to the Procedure:   ACS: No.  Non-ACS:    Anginal Classification: CCS II  Anti-ischemic medical therapy: No Therapy  Non-Invasive Test Results: Intermediate-risk stress test findings: cardiac mortality 1-3%/year  Prior CABG: No previous CABG      History and Physical Interval Note:  07/31/2016 10:52 AM  Richard Vang  has presented today for surgery, with the diagnosis of abnormal nuclear stress test  The various methods of treatment have been discussed with the patient and family. After consideration of risks, benefits and other options for treatment, the patient has consented to  Procedure(s): Left Heart Cath and Coronary Angiography (N/A) as a surgical intervention .  The patient's history has been reviewed, patient examined, no change in status, stable for surgery.  I have reviewed the patient's chart and labs.  Questions were answered to the patient's satisfaction.     Quay Burow

## 2016-07-31 NOTE — Care Management Note (Signed)
Case Management Note  Patient Details  Name: JUDEA FANG MRN: [id number] Date of Birth: April 21, 1946  Subjective/Objective:    S/p coronary stent intervention, on plavix and asa, NCM will cont to follow for dc needs.                Action/Plan:   Expected Discharge Date:                  Expected Discharge Plan:  Home/Self Care  In-House Referral:     Discharge planning Services  CM Consult  Post Acute Care Choice:    Choice offered to:     DME Arranged:    DME Agency:     HH Arranged:    HH Agency:     Status of Service:  In process, will continue to follow  If discussed at Long Length of Stay Meetings, dates discussed:    Additional Comments:  Zenon Mayo, RN 07/31/2016, 3:17 PM

## 2016-08-01 DIAGNOSIS — R9439 Abnormal result of other cardiovascular function study: Secondary | ICD-10-CM

## 2016-08-01 DIAGNOSIS — Z9861 Coronary angioplasty status: Secondary | ICD-10-CM

## 2016-08-01 DIAGNOSIS — I2510 Atherosclerotic heart disease of native coronary artery without angina pectoris: Secondary | ICD-10-CM | POA: Diagnosis not present

## 2016-08-01 DIAGNOSIS — I255 Ischemic cardiomyopathy: Secondary | ICD-10-CM

## 2016-08-01 DIAGNOSIS — I2583 Coronary atherosclerosis due to lipid rich plaque: Secondary | ICD-10-CM | POA: Diagnosis not present

## 2016-08-01 DIAGNOSIS — R0609 Other forms of dyspnea: Secondary | ICD-10-CM

## 2016-08-01 DIAGNOSIS — Z8249 Family history of ischemic heart disease and other diseases of the circulatory system: Secondary | ICD-10-CM | POA: Diagnosis not present

## 2016-08-01 LAB — BASIC METABOLIC PANEL
ANION GAP: 8 (ref 5–15)
BUN: 20 mg/dL (ref 6–20)
CALCIUM: 9.1 mg/dL (ref 8.9–10.3)
CO2: 23 mmol/L (ref 22–32)
Chloride: 112 mmol/L — ABNORMAL HIGH (ref 101–111)
Creatinine, Ser: 1.37 mg/dL — ABNORMAL HIGH (ref 0.61–1.24)
GFR, EST AFRICAN AMERICAN: 59 mL/min — AB (ref >60)
GFR, EST NON AFRICAN AMERICAN: 51 mL/min — AB (ref >60)
Glucose, Bld: 107 mg/dL — ABNORMAL HIGH (ref 65–99)
Potassium: 4.1 mmol/L (ref 3.5–5.1)
SODIUM: 143 mmol/L (ref 135–145)

## 2016-08-01 LAB — CBC
HCT: 36.9 % — ABNORMAL LOW (ref 39.0–52.0)
HEMOGLOBIN: 11.6 g/dL — AB (ref 13.0–17.0)
MCH: 30.6 pg (ref 26.0–34.0)
MCHC: 31.4 g/dL (ref 30.0–36.0)
MCV: 97.4 fL (ref 78.0–100.0)
Platelets: 113 10*3/uL — ABNORMAL LOW (ref 150–400)
RBC: 3.79 MIL/uL — AB (ref 4.22–5.81)
RDW: 16.5 % — ABNORMAL HIGH (ref 11.5–15.5)
WBC: 4 10*3/uL (ref 4.0–10.5)

## 2016-08-01 LAB — GLUCOSE, CAPILLARY: GLUCOSE-CAPILLARY: 112 mg/dL — AB (ref 65–99)

## 2016-08-01 MED ORDER — CLOPIDOGREL BISULFATE 75 MG PO TABS: 75.0000 mg | ORAL_TABLET | Freq: Every day | ORAL | 12 refills | Status: AC

## 2016-08-01 MED ORDER — ASPIRIN EC 81 MG PO TBEC: 81.0000 mg | DELAYED_RELEASE_TABLET | Freq: Every day | ORAL | 12 refills | Status: DC

## 2016-08-01 MED ORDER — CARVEDILOL 12.5 MG PO TABS: 18.7500 mg | ORAL_TABLET | Freq: Two times a day (BID) | ORAL | 12 refills | Status: DC

## 2016-08-01 MED ORDER — CARVEDILOL 3.125 MG PO TABS: 18.7500 mg | ORAL_TABLET | Freq: Two times a day (BID) | ORAL | Status: DC

## 2016-08-01 MED ORDER — ATORVASTATIN CALCIUM 40 MG PO TABS: 40.0000 mg | ORAL_TABLET | Freq: Every day | ORAL | Status: DC

## 2016-08-01 MED ORDER — ATORVASTATIN CALCIUM 40 MG PO TABS: 40.0000 mg | ORAL_TABLET | Freq: Every day | ORAL | 12 refills | Status: AC

## 2016-08-01 MED FILL — Ondansetron HCl Inj 4 MG/2ML (2 MG/ML): INTRAMUSCULAR | Qty: 2 | Status: AC

## 2016-08-01 NOTE — Progress Notes (Signed)
Patient Name: Richard Vang Date of Encounter: 08/01/2016  Primary Cardiologist: Richard Vang Problem List     Principal Problem:   Abnormal nuclear stress test Active Problems:   Cardiomyopathy, ischemic: EF ~25 % by LV Gram   CAD S/P percutaneous coronary angioplasty   DOE (dyspnea on exertion)    Subjective   Feels great, walking around. Denies chest pain and SOB.   Inpatient Medications    Scheduled Meds: . aspirin  81 mg Oral Daily  . atorvastatin  40 mg Oral QHS  . carvedilol  18.75 mg Oral BID WC  . cholecalciferol  5,000 Units Oral Daily  . clopidogrel  75 mg Oral Q breakfast  . ondansetron (ZOFRAN) IV  4 mg Intravenous Once  . quinapril  20 mg Oral [id number]  . sodium chloride flush  3 mL Intravenous Q12H   Continuous Infusions:   PRN Meds: sodium chloride, acetaminophen, morphine injection, ondansetron (ZOFRAN) IV, sodium chloride flush   Vital Signs    Vitals:   07/31/16 2100 08/01/16 0432 08/01/16 0802 08/01/16 0812  BP: 126/72 (!) 169/86 (!) 160/76   Pulse: 72 73 84   Resp: (!) 22 16 (!) 21   Temp:  97.5 F (36.4 C) 97.7 F (36.5 C) 97.7 F (36.5 C)  TempSrc:  Axillary Oral Oral  SpO2: 98% 98% 98%   Weight:  119.2 kg (262 lb 12.6 oz)    Height:        Intake/Output Summary (Last 24 hours) at 08/01/16 0945 Last data filed at 08/01/16 0700  Gross per 24 hour  Intake           545.25 ml  Output              750 ml  Net          -204.75 ml   Filed Weights   07/31/16 0859 08/01/16 0432  Weight: 113.4 kg (250 lb) 119.2 kg (262 lb 12.6 oz)    Physical Exam   GEN: Well nourished, well developed, in no acute distress.  HEENT: Grossly normal.  Neck: Supple, no JVD, carotid bruits, or masses. Cardiac: RRR, no murmurs, rubs, or gallops. No clubbing, cyanosis, edema.  Radials/DP/PT 2+ and equal bilaterally.  Respiratory:  Respirations regular and unlabored, clear to auscultation bilaterally. GI: Soft, nontender, nondistended, BS + x  4. MS: no deformity or atrophy. Skin: warm and dry, no rash. Neuro:  Strength and sensation are intact. Psych: AAOx3.  Normal affect.  Labs    CBC  Recent Labs  08/01/16 0403  WBC 4.0  HGB 11.6*  HCT 36.9*  MCV 97.4  PLT 123456*   Basic Metabolic Panel  Recent Labs  08/01/16 0403  NA 143  K 4.1  CL 112*  CO2 23  GLUCOSE 107*  BUN 20  CREATININE 1.37*  CALCIUM 9.1     Telemetry    Atrial fibrillation- Personally Reviewed  ECG    Atrial fibrillation- Personally Reviewed  Radiology    No results found.  Cardiac Studies   Coronary Stent Intervention  Left Heart Cath and Coronary Angiography 07/31/16   IMPRESSION: Successful proximal LAD PCI and drug-eluting stenting in the setting of LV dysfunction, dyspnea on exertion, and anterior ischemia on Myoview stress testing. I did elect to place a drug-eluting stent because of the proximal nature of the lesion in the LAD. I'm not sure that the LV dysfunction is related to the LAD. He will need dual antiplatelet therapy in  addition to his Coumadin for 1 month after which the aspirin can be discontinued. He'll meet need aggressive medical therapy for his LV dysfunction. He'll be discharged home in the morning and will follow-up with Richard Vang. He left the lab in stable condition.   Patient Profile     Richard Vang is a 70 year old male with a past medical history of HTN, HLD, permanent Afib, DM, and family history of CAD. He had a recent Myoview that showed anterior ischemia and he was referred by Richard Vang in [facility] for a cardiac catheterization. He had DES placed to his proximal LAD.   Assessment & Plan    1. Abn Nuc --> CAD s/p DES to proximal LAD: Patient sees Richard Vang in [facility] and was referred to heart cath after nuclear stress test showed anterior ischemia.   DES placed to proximal LAD that was 80% stenosed. He will be on triple therapy for 30 days with ASA+ Plavix and Coumadin, then will be on Plavix  + Coumadin only. No ASA.   He has walked with cardiac rehab and did well, no chest pain. Will need Echo when he follows up with Richard Vang.    2. Permanent Afib: Rate controlled on Coreg.   This patients CHA2DS2-VASc Score and unadjusted Ischemic Stroke Rate (% per year) is equal to 4.8 % stroke rate/year from a score of 4 Above score calculated as 1 point each if present [CHF, HTN, DM, Vascular=MI/PAD/Aortic Plaque, Age if 65-74, or Male], 2 points each if present [Age > 75, or Stroke/TIA/TE]  Continue Coumadin.   3. Ischemic CM: Severely reduced LV Fxn on LV Gram ~25-35%.  Now s/p LAD PCI - should help, but needs titration of Cardiac Meds - per MD.   4. HTN: Borderline controlled with Coreg and ACE-I.  -- titrate up Coreg to 18.75 mg bid.  5. HLD: Will need high intensity statin, increase atorvastatin to 40mg .     Signed, Richard Hew, MD  08/01/2016, 9:45 AM    I have seen, examined and evaluated the patient this AM along with Richard Booze, NP.  After reviewing all the available data and chart, we discussed the patients laboratory, study & physical findings as well as symptoms in detail. I agree with her findings, examination as well as impression recommendations as per our discussion.    Overall doing well post PCI - still tired & DOE - but notably improved from Pre-PCI. Mild bruise @ cath site - no bruit. - should resolve.  BP still borderline control & has BLE Edema with intermitted orthopnea & EF ~25% -->   Increase coreg to 18.75 bid  Low dose Lasix as baseline & discussed PRN use  Would benefit from Spironolactone - add as OP.  Otherwise OK for d/c today -- f/u with Mr. Claudie Vang.    Richard Vang, M.D., M.S. Interventional Cardiologist   Pager # [phone] Phone # [phone] [address] [place], [place] [address]

## 2016-08-01 NOTE — Discharge Summary (Signed)
Discharge Summary    Patient ID: Richard Vang,  MRN: [id number], DOB/AGE: [birth year] 70 y.o.  Admit date: 07/31/2016 Discharge date: 08/01/2016  Primary Care Provider: Sandi Mariscal Primary Cardiologist: Dr. Claudie Leach  Discharge Diagnoses    Principal Problem:   Abnormal nuclear stress test Active Problems:   Cardiomyopathy, ischemic: EF ~25 % by LV Gram   DOE (dyspnea on exertion)   CAD S/P percutaneous coronary angioplasty   Allergies No Known Allergies  Diagnostic Studies/Procedures   Coronary Stent Intervention  Left Heart Cath and Coronary Angiography 07/31/16    Prox LAD lesion, 80 %stenosed.  Post intervention, there is a 0% residual stenosis.  A stent was successfully placed.  There is moderate to severe left ventricular systolic dysfunction.  LV end diastolic pressure is moderately elevated. The left ventricular ejection fraction is 25-35% by visual estimate.   _____________   History of Present Illness     Richard Vang is a 70 year old moderate to severely overweight married Caucasian male father of 2 living children (1 deceased), grandfather of 5 grandchildren referred by Dr. Claudie Leach for cardiac catheterization.   He is retired from working part-time at [employer]. His risk factors include treated hypertension, diabetes and hyperlipidemia. His father did die of a myocardial infarction at age 31. He has never had a heart attack or stroke.  He has had increasing dyspnea on exertion for the last 4-6 weeks. Recent Myoview stress test showed LV dysfunction with an EF of 40% and mild anterior ischemia. He presents today for cardiac catheterization to define his anatomy and rule out ischemic etiology.   Hospital Course  He had left heart cath on 07/31/16, also underwent successful DES placement to his proximal LAD. He will be on triple therapy for 30 days with ASA+ Plavix and Coumadin, then will be on Plavix + Coumadin only, no ASA.   His atrial fibrillation  was rate controlled on Coreg, will continue same.   He has severely reduced LV function by LV gram, EF estimated at 25-35%, needs further titration and optimization of his cardiac meds per his primary Cardiologist.   Will increase Lipitor to 40mg  daily.   He was seen today by Dr. Ellyn Hack and deemed suitable for discharge.  _____________  Discharge Vitals Blood pressure (!) 160/76, pulse 84, temperature 97.7 F (36.5 C), temperature source Oral, resp. rate (!) 21, height 5\' 9"  (1.753 m), weight 262 lb 12.6 oz (119.2 kg), SpO2 98 %.  Filed Weights   07/31/16 0859 08/01/16 0432  Weight: 250 lb (113.4 kg) 262 lb 12.6 oz (119.2 kg)    Labs & Radiologic Studies     CBC  Recent Labs  08/01/16 0403  WBC 4.0  HGB 11.6*  HCT 36.9*  MCV 97.4  PLT 123456*   Basic Metabolic Panel  Recent Labs  08/01/16 0403  NA 143  K 4.1  CL 112*  CO2 23  GLUCOSE 107*  BUN 20  CREATININE 1.37*  CALCIUM 9.1     Disposition   Pt is being discharged home today in good condition.  Follow-up Plans & Appointments     Discharge Instructions    AMB Referral to Cardiac Rehabilitation - Phase II    Complete by:  As directed    Diagnosis:  Coronary Stents   Amb Referral to Cardiac Rehabilitation    Complete by:  As directed    Diagnosis:  Coronary Stents   Diet - low sodium heart healthy    Complete by:  As directed    Discharge instructions    Complete by:  As directed    Take 81mg  aspirin, Plavix and Coumadin for one month, then STOP taking aspirin and take only Plavix (clopidogrel) and Coumadin. Also, do not resume Metformin until tomorrow.     Radial Site Care Refer to this sheet in the next few weeks. These instructions provide you with information on caring for yourself after your procedure. Your caregiver may also give you more specific instructions. Your treatment has been planned according to current medical practices, but problems sometimes occur. Call your caregiver if you have  any problems or questions after your procedure. HOME CARE INSTRUCTIONS You may shower the day after the procedure.Remove the bandage (dressing) and gently wash the site with plain soap and water.Gently pat the site dry.  Do not apply powder or lotion to the site.  Do not submerge the affected site in water for 3 to 5 days.  Inspect the site at least twice daily.  Do not flex or bend the affected arm for 24 hours.  No lifting over 5 pounds (2.3 kg) for 5 days after your procedure.  Do not drive home if you are discharged the same day of the procedure. Have someone else drive you.  You may drive 24 hours after the procedure unless otherwise instructed by your caregiver.  What to expect: Any bruising will usually fade within 1 to 2 weeks.  Blood that collects in the tissue (hematoma) may be painful to the touch. It should usually decrease in size and tenderness within 1 to 2 weeks.  SEEK IMMEDIATE MEDICAL CARE IF: You have unusual pain at the radial site.  You have redness, warmth, swelling, or pain at the radial site.  You have drainage (other than a small amount of blood on the dressing).  You have chills.  You have a fever or persistent symptoms for more than 72 hours.  You have a fever and your symptoms suddenly get worse.  Your arm becomes pale, cool, tingly, or numb.  You have heavy bleeding from the site. Hold pressure on the site.   NO HEAVY LIFTING OR SEXUAL ACTIVITY for 7 DAYS. NO DRIVING for 3-5 DAYS. NO SOAKING BATHS, HOT TUBS, POOLS, ETC., for 7 DAYS   Increase activity slowly    Complete by:  As directed       Discharge Medications   Current Discharge Medication List    START taking these medications   Details  aspirin EC 81 MG tablet Take 1 tablet (81 mg total) by mouth daily. Qty: 30 tablet, Refills: 12    clopidogrel (PLAVIX) 75 MG tablet Take 1 tablet (75 mg total) by mouth daily with breakfast. Qty: 30 tablet, Refills: 12      CONTINUE these medications  which have CHANGED   Details  atorvastatin (LIPITOR) 40 MG tablet Take 1 tablet (40 mg total) by mouth at bedtime. Qty: 30 tablet, Refills: 12    carvedilol (COREG) 12.5 MG tablet Take 1.5 tablets (18.75 mg total) by mouth 2 (two) times daily with a meal. Qty: 90 tablet, Refills: 12      CONTINUE these medications which have NOT CHANGED   Details  Cholecalciferol (VITAMIN D3) 5000 units CAPS Take 1 capsule by mouth daily.     lisinopril (PRINIVIL,ZESTRIL) 20 MG tablet Take 20 mg by mouth every morning.     metFORMIN (GLUCOPHAGE-XR) 500 MG 24 hr tablet Take 1,000 mg by mouth daily with breakfast.  warfarin (COUMADIN) 6 MG tablet Take 6 mg by mouth every morning.       STOP taking these medications     omeprazole (PRILOSEC) 40 MG capsule      quinapril (ACCUPRIL) 20 MG tablet             Outstanding Labs/Studies     Duration of Discharge Encounter   Greater than 30 minutes including physician time.  Signed, Arbutus Leas NP 08/01/2016, 11:07 AM   I have seen, examined and evaluated the patient this AM along with Jettie Booze, NP.  After reviewing all the available data and chart, we discussed the patients laboratory, study & physical findings as well as symptoms in detail. I agree with her findings, examination as well as impression recommendations as per our discussion. Agree with d/c summary.   Overall doing well post PCI - still tired & DOE - but notably improved from Pre-PCI. Mild bruise @ cath site - no bruit. - should resolve.  BP still borderline control & has BLE Edema with intermitted orthopnea & EF ~25% -->   Increase coreg to 18.75 bid  Low dose Lasix as baseline & discussed PRN use  Would benefit from Spironolactone - add as OP.  Otherwise OK for d/c today -- f/u with Mr. Claudie Leach.    Glenetta Hew, M.D., M.S. Interventional Cardiologist   Pager # [phone] Phone # [phone] [address] [place], [place] [address]

## 2016-08-01 NOTE — Progress Notes (Signed)
Discharge instructions, RX's and follow up appts explained and provided to patient verbalized understanding. Patient left floor via wheelchair accompanied by volunteers. No c/o pain or shortness of breath at discharge.   Reverie Vaquera, Tivis Ringer, RN

## 2016-08-01 NOTE — Care Management Note (Signed)
Case Management Note  Patient Details  Name: Richard Vang MRN: [id number] Date of Birth: 01/09/1946  Subjective/Objective:  S/p coronary stent intervention , on plavix and asa, he has a pcp, he has medication coverage and transportation.  He has a walker, cane, w/chair , 3 n 1 and bath seat at home.  He is for dc today , no needs.                  Action/Plan:   Expected Discharge Date:                  Expected Discharge Plan:  Home/Self Care  In-House Referral:     Discharge planning Services  CM Consult  Post Acute Care Choice:    Choice offered to:     DME Arranged:    DME Agency:     HH Arranged:    HH Agency:     Status of Service:  Completed, signed off  If discussed at [employer] of Stay Meetings, dates discussed:    Additional Comments:  Zenon Mayo, RN 08/01/2016, 10:24 AM

## 2016-08-01 NOTE — Progress Notes (Signed)
CARDIAC REHAB PHASE I   PRE:  Rate/Rhythm: 82 afib  BP:  Supine:   Sitting: 160/79  Standing:    SaO2:   MODE:  Ambulation: 600 ft   POST:  Rate/Rhythm: 96 afib  BP:  Supine:   Sitting: 153/101, 186/97  Standing:    SaO2: 89-91%RA [id number] Pt walked 600 ft on RA with steady gait. Very DOE and stopped to rest a couple of times. Pt stated he has been SOB and has not been able to walk much. BP elevated and sats a little low after walk. Discussed with pt low EF and signs/symptoms of CHF. Gave CHF booklet and reviewed zones. Encouraged him to weigh daily and keep sodium to 2000 mg. Gave diabetic diet and briefly discussed carb counting. Pt stated he does not count carbs but his wife is diabetic also and she is more knowledgeable. Reviewed importance of plavix with stent, NTG use and ex ed. Discussed CRP 2 and will refer to [place].   Graylon Good, RN BSN  08/01/2016 8:47 AM

## 2016-10-01 ENCOUNTER — Inpatient Hospital Stay (HOSPITAL_COMMUNITY)
Admission: EM | Admit: 2016-10-01 | Discharge: 2016-10-08 | DRG: 840 | Disposition: A | Discharge status: Skilled Nursing Facility | Payer: Medicare HMO | Attending: Internal Medicine | Admitting: Internal Medicine

## 2016-10-01 ENCOUNTER — Emergency Department (HOSPITAL_COMMUNITY): Payer: Medicare HMO

## 2016-10-01 ENCOUNTER — Encounter (HOSPITAL_COMMUNITY): Payer: Self-pay | Admitting: Nurse Practitioner

## 2016-10-01 DIAGNOSIS — I130 Hypertensive heart and chronic kidney disease with heart failure and stage 1 through stage 4 chronic kidney disease, or unspecified chronic kidney disease: Secondary | ICD-10-CM | POA: Diagnosis present

## 2016-10-01 DIAGNOSIS — J811 Chronic pulmonary edema: Secondary | ICD-10-CM

## 2016-10-01 DIAGNOSIS — R52 Pain, unspecified: Secondary | ICD-10-CM

## 2016-10-01 DIAGNOSIS — T501X5A Adverse effect of loop [high-ceiling] diuretics, initial encounter: Secondary | ICD-10-CM | POA: Diagnosis not present

## 2016-10-01 DIAGNOSIS — I10 Essential (primary) hypertension: Secondary | ICD-10-CM | POA: Diagnosis present

## 2016-10-01 DIAGNOSIS — N179 Acute kidney failure, unspecified: Secondary | ICD-10-CM

## 2016-10-01 DIAGNOSIS — G4730 Sleep apnea, unspecified: Secondary | ICD-10-CM | POA: Diagnosis present

## 2016-10-01 DIAGNOSIS — C9000 Multiple myeloma not having achieved remission: Principal | ICD-10-CM

## 2016-10-01 DIAGNOSIS — M545 Low back pain, unspecified: Secondary | ICD-10-CM | POA: Diagnosis present

## 2016-10-01 DIAGNOSIS — I482 Chronic atrial fibrillation, unspecified: Secondary | ICD-10-CM | POA: Diagnosis present

## 2016-10-01 DIAGNOSIS — D696 Thrombocytopenia, unspecified: Secondary | ICD-10-CM | POA: Diagnosis present

## 2016-10-01 DIAGNOSIS — Z7901 Long term (current) use of anticoagulants: Secondary | ICD-10-CM

## 2016-10-01 DIAGNOSIS — R0602 Shortness of breath: Secondary | ICD-10-CM

## 2016-10-01 DIAGNOSIS — Z955 Presence of coronary angioplasty implant and graft: Secondary | ICD-10-CM

## 2016-10-01 DIAGNOSIS — Z9861 Coronary angioplasty status: Secondary | ICD-10-CM

## 2016-10-01 DIAGNOSIS — R509 Fever, unspecified: Secondary | ICD-10-CM

## 2016-10-01 DIAGNOSIS — Z79899 Other long term (current) drug therapy: Secondary | ICD-10-CM

## 2016-10-01 DIAGNOSIS — E870 Hyperosmolality and hypernatremia: Secondary | ICD-10-CM | POA: Diagnosis not present

## 2016-10-01 DIAGNOSIS — E1122 Type 2 diabetes mellitus with diabetic chronic kidney disease: Secondary | ICD-10-CM | POA: Diagnosis present

## 2016-10-01 DIAGNOSIS — I509 Heart failure, unspecified: Secondary | ICD-10-CM

## 2016-10-01 DIAGNOSIS — D6959 Other secondary thrombocytopenia: Secondary | ICD-10-CM | POA: Diagnosis present

## 2016-10-01 DIAGNOSIS — E538 Deficiency of other specified B group vitamins: Secondary | ICD-10-CM

## 2016-10-01 DIAGNOSIS — I255 Ischemic cardiomyopathy: Secondary | ICD-10-CM | POA: Diagnosis present

## 2016-10-01 DIAGNOSIS — I2510 Atherosclerotic heart disease of native coronary artery without angina pectoris: Secondary | ICD-10-CM | POA: Diagnosis present

## 2016-10-01 DIAGNOSIS — E876 Hypokalemia: Secondary | ICD-10-CM | POA: Diagnosis not present

## 2016-10-01 DIAGNOSIS — B1920 Unspecified viral hepatitis C without hepatic coma: Secondary | ICD-10-CM | POA: Diagnosis present

## 2016-10-01 DIAGNOSIS — J690 Pneumonitis due to inhalation of food and vomit: Secondary | ICD-10-CM | POA: Diagnosis not present

## 2016-10-01 DIAGNOSIS — R0902 Hypoxemia: Secondary | ICD-10-CM

## 2016-10-01 DIAGNOSIS — K219 Gastro-esophageal reflux disease without esophagitis: Secondary | ICD-10-CM | POA: Diagnosis present

## 2016-10-01 DIAGNOSIS — D649 Anemia, unspecified: Secondary | ICD-10-CM

## 2016-10-01 DIAGNOSIS — E785 Hyperlipidemia, unspecified: Secondary | ICD-10-CM | POA: Diagnosis present

## 2016-10-01 DIAGNOSIS — J90 Pleural effusion, not elsewhere classified: Secondary | ICD-10-CM

## 2016-10-01 DIAGNOSIS — K7460 Unspecified cirrhosis of liver: Secondary | ICD-10-CM | POA: Diagnosis present

## 2016-10-01 DIAGNOSIS — I5023 Acute on chronic systolic (congestive) heart failure: Secondary | ICD-10-CM | POA: Diagnosis present

## 2016-10-01 DIAGNOSIS — N183 Chronic kidney disease, stage 3 (moderate): Secondary | ICD-10-CM | POA: Diagnosis present

## 2016-10-01 DIAGNOSIS — Z7982 Long term (current) use of aspirin: Secondary | ICD-10-CM

## 2016-10-01 DIAGNOSIS — I959 Hypotension, unspecified: Secondary | ICD-10-CM | POA: Diagnosis not present

## 2016-10-01 DIAGNOSIS — M899 Disorder of bone, unspecified: Secondary | ICD-10-CM

## 2016-10-01 DIAGNOSIS — Z7902 Long term (current) use of antithrombotics/antiplatelets: Secondary | ICD-10-CM

## 2016-10-01 DIAGNOSIS — Z7984 Long term (current) use of oral hypoglycemic drugs: Secondary | ICD-10-CM

## 2016-10-01 DIAGNOSIS — D539 Nutritional anemia, unspecified: Secondary | ICD-10-CM | POA: Diagnosis present

## 2016-10-01 DIAGNOSIS — M898X9 Other specified disorders of bone, unspecified site: Secondary | ICD-10-CM

## 2016-10-01 LAB — PROTIME-INR
INR: 3.03
Prothrombin Time: 32 seconds — ABNORMAL HIGH (ref 11.4–15.2)

## 2016-10-01 LAB — CBC
HEMATOCRIT: 27.4 % — AB (ref 39.0–52.0)
Hemoglobin: 8.4 g/dL — ABNORMAL LOW (ref 13.0–17.0)
MCH: 31 pg (ref 26.0–34.0)
MCHC: 30.7 g/dL (ref 30.0–36.0)
MCV: 101.1 fL — AB (ref 78.0–100.0)
Platelets: 120 10*3/uL — ABNORMAL LOW (ref 150–400)
RBC: 2.71 MIL/uL — ABNORMAL LOW (ref 4.22–5.81)
RDW: 18 % — AB (ref 11.5–15.5)
WBC: 4.1 10*3/uL (ref 4.0–10.5)

## 2016-10-01 LAB — D-DIMER, QUANTITATIVE (NOT AT ARMC): D DIMER QUANT: 1.85 ug{FEU}/mL — AB (ref 0.00–0.50)

## 2016-10-01 NOTE — ED Triage Notes (Signed)
Pt presents with c/o abnormal ultrasound. He reports 5 week history SOB, cough, lower back pain. He denies fevers, chest pain. His symptoms have been increasingly worse since onset. His doctor ordered the [name] today and was concerned for a possible PE on [name] results so the patient was sent to ED.

## 2016-10-01 NOTE — ED Notes (Addendum)
Lab called-critical calcium 13, Tegeler MD notified

## 2016-10-01 NOTE — ED Provider Notes (Signed)
[place] DEPT Provider Note   CSN: [id number] Arrival date & time: 10/01/16  1659     History   Chief Complaint Chief Complaint  Patient presents with  . Abnormal Lab    HPI Richard Vang is a 70 y.o. male.  HPI  Pt comes in with cc of abnormal labs. Pt has hx of CAD, CHF (EF 25%), DM. Pt was seen by Cards, echo showed R sided heart strain and so pt was advised to come to the ER for r/o Afib. Pt reports that he has been going to the PCP for abd pain/back pain. Symptoms have been going for 5 weeks. Pt has abd pain with it too. No uti like symptoms. No associated numbness, weakness, urinary incontinence, urinary retention, bowel incontinence, saddle anesthesia.  Pt also reports some DIB. Pt is on coumadin due to afib. Pt has no hx of PE/DVT. Pt denies any weight gain.  Wife reports that in the past 5 weeks, pt has gone from being active to now almost being wheel chair bound due to the back pain.   Past Medical History:  Diagnosis Date  . Arthritis    "left shoulder" (07/31/2016)  . Coronary artery disease   . GERD (gastroesophageal reflux disease)   . Heart murmur   . High cholesterol   . Hypertension   . Sleep apnea    "probably; having test in November" (07/31/2016)  . Type II diabetes mellitus [facility])     Patient Active Problem List   Diagnosis Date Noted  . CAD S/P percutaneous coronary angioplasty 07/31/2016  . DOE (dyspnea on exertion)   . Essential hypertension 07/18/2016  . Hyperlipidemia 07/18/2016  . Diabetes ([place]) 07/18/2016  . Chronic atrial fibrillation ([place]) 07/18/2016  . Current use of long term anticoagulation 07/18/2016  . Dyspnea on exertion 07/18/2016  . Cardiomyopathy, ischemic: EF ~25 % by LV Gram 07/18/2016  . Abnormal nuclear stress test 07/18/2016    Past Surgical History:  Procedure Laterality Date  . CARDIAC CATHETERIZATION N/A 07/31/2016   Procedure: Left Heart Cath and Coronary Angiography;  Surgeon: Lorretta Harp, MD;   Location: [place] [facility];  Service: Cardiovascular;  Laterality: N/A;  . CARDIAC CATHETERIZATION N/A 07/31/2016   Procedure: Coronary Stent Intervention;  Surgeon: Lorretta Harp, MD;  Location: [place] [facility];  Service: Cardiovascular;  Laterality: N/A;  . CORONARY ANGIOPLASTY    . SHOULDER SURGERY Left 1973   "put pin in it where it had separated"   . TONSILLECTOMY  ~ 1956       Home Medications    Prior to Admission medications   Medication Sig Start Date End Date Taking? Authorizing Provider  atorvastatin (LIPITOR) 40 MG tablet Take 1 tablet (40 mg total) by mouth at bedtime. 08/01/16  Yes Arbutus Leas, NP  carvedilol (COREG) 12.5 MG tablet Take 1.5 tablets (18.75 mg total) by mouth 2 (two) times daily with a meal. 08/01/16  Yes Arbutus Leas, NP  Cholecalciferol (VITAMIN D3) 5000 units CAPS Take 1 capsule by mouth daily.    Yes Historical Provider, MD  clopidogrel (PLAVIX) 75 MG tablet Take 1 tablet (75 mg total) by mouth daily with breakfast. 08/02/16  Yes Arbutus Leas, NP  furosemide (LASIX) 40 MG tablet Take 40 mg by mouth 2 (two) times daily.   Yes Historical Provider, MD  ibuprofen (ADVIL,MOTRIN) 200 MG tablet Take 200 mg by mouth every 6 (six) hours as needed for moderate pain.   Yes Historical  Provider, MD  lisinopril (PRINIVIL,ZESTRIL) 20 MG tablet Take 20 mg by mouth every morning.  07/02/16  Yes Historical Provider, MD  metFORMIN (GLUCOPHAGE-XR) 500 MG 24 hr tablet Take 1,000 mg by mouth daily with breakfast.  04/14/16  Yes Historical Provider, MD  oxyCODONE (OXY IR/ROXICODONE) 5 MG immediate release tablet Take 5 mg by mouth every 4 (four) hours as needed for severe pain.   Yes Historical Provider, MD  potassium chloride SA (K-DUR,KLOR-CON) 20 MEQ tablet Take 20 mEq by mouth 2 (two) times daily.   Yes Historical Provider, MD  warfarin (COUMADIN) 6 MG tablet Take 6 mg by mouth every morning.  06/05/16  Yes Historical Provider, MD  aspirin EC 81 MG tablet Take 1 tablet  (81 mg total) by mouth daily. Patient not taking: Reported on 10/01/2016 08/01/16   Arbutus Leas, NP    Family History History reviewed. No pertinent family history.  Social History Social History  Substance Use Topics  . Smoking status: Never Smoker  . Smokeless tobacco: Never Used  . Alcohol use Yes     Comment: 07/31/2016 "nothing since 2002"     Allergies   Patient has no known allergies.   Review of Systems Review of Systems  ROS 10 Systems reviewed and are negative for acute change except as noted in the HPI.     Physical Exam Updated Vital Signs BP 118/80   Pulse 65   Temp 97.8 F (36.6 C) (Oral)   Resp 16   Ht 5\' 10"  (1.778 m)   Wt 260 lb (117.9 kg)   SpO2 92%   BMI 37.31 kg/m   Physical Exam  Constitutional: He is oriented to person, place, and time. He appears well-developed.  HENT:  Head: Normocephalic and atraumatic.  Eyes: Conjunctivae and EOM are normal. Pupils are equal, round, and reactive to light.  Neck: Normal range of motion. Neck supple.  Cardiovascular: Normal rate and regular rhythm.   Pulmonary/Chest: Effort normal. He has rales.  Abdominal: Soft. Bowel sounds are normal. He exhibits no distension. There is no tenderness. There is no rebound and no guarding.  Musculoskeletal:  Pt has L spine tenderness  Neurological: He is alert and oriented to person, place, and time.  Skin: Skin is warm.  Nursing note and vitals reviewed.     ED Treatments / Results  Labs (all labs ordered are listed, but only abnormal results are displayed) Labs Reviewed  BASIC METABOLIC PANEL - Abnormal; Notable for the following:       Result Value   Sodium 146 (*)    Glucose, Bld 100 (*)    BUN 44 (*)    Creatinine, Ser 2.25 (*)    Calcium >13.0 (*)    GFR calc non Af Amer 28 (*)    GFR calc Af Amer 32 (*)    All other components within normal limits  CBC - Abnormal; Notable for the following:    RBC 2.71 (*)    Hemoglobin 8.4 (*)    HCT 27.4  (*)    MCV 101.1 (*)    RDW 18.0 (*)    Platelets 120 (*)    All other components within normal limits  PROTIME-INR - Abnormal; Notable for the following:    Prothrombin Time 32.0 (*)    All other components within normal limits  D-DIMER, QUANTITATIVE (NOT AT [facility]) - Abnormal; Notable for the following:    D-Dimer, Quant 1.85 (*)    All other components within normal limits  Randolm Idol, ED  TYPE AND SCREEN    EKG  EKG Interpretation  Date/Time:  Wednesday October 01 2016 17:19:10 EST Ventricular Rate:  68 PR Interval:    QRS Duration: 102 QT Interval:  456 QTC Calculation: 484 R Axis:   19 Text Interpretation:  Atrial fibrillation with premature ventricular or aberrantly conducted complexes Nonspecific T wave abnormality , probably digitalis effect Prolonged QT Abnormal ECG No significant change since last tracing Confirmed by Kathrynn Humble, MD, Thelma Comp [phone]) on 10/01/2016 10:36:56 PM       Radiology No results found.  Procedures Procedures (including critical care time)   EMERGENCY DEPARTMENT [name] CARDIAC EXAM "Study: Limited Ultrasound of the heart and pericardium"  INDICATIONS:Dyspnea Multiple views of the heart and pericardium were obtained in real-time with a multi-frequency probe.  PERFORMED [id number]  IMAGES ARCHIVED?: Yes  FINDINGS: No pericardial effusion, Decreased contractility and Tamponade physiology absent  LIMITATIONS:  Body habitus and Emergent procedure  VIEWS USED: Subcostal 4 chamber, Parasternal long axis and Apical 4 chamber   INTERPRETATION: Cardiac activity present, Cardiac tamponade absent and Decreased contractility RIGHT SIDED CHAMBERS DO APPEAR DILATED, NO DIASTOLIC COLLAPSE.  CPT Code: [phone] (limited transthoracic cardiac)   Medications Ordered in ED Medications - No data to display   Initial Impression / Assessment and Plan / ED Course  I have reviewed the triage vital signs and the nursing notes.  Pertinent labs &  imaging results that were available during my care of the patient were reviewed by me and considered in my medical decision making (see chart for details).  Clinical Course    Pt comes in because PCP sent him to the ER. Pt has been going to the medical center for back pain, they did a bedside echo and found right sided heart strain, and so rushed him to the ER.  Well - on my bedside echo, R sided chamber is enlarged, there is no diastolic collapse. Pt is on coumadin, which further makes PE less likely. Dimer ordered- pt can get VQ scan if +. Pt did have a cath recently, and so he is at higher risk for PE.  Pt's back pain appears MS. He also has abd pain. With abd pain and back pain, AAA considered in the ddx. Bedside US was equivocal. CT non contrast ordered.  Pt also has hypercalcemia and AKI - will need admission for that. His Hb is lower, we will order an occult. Pt  Denies GI bleed.  Final Clinical Impressions(s) / ED Diagnoses   Final diagnoses:  Hypercalcemia  AKI (acute kidney injury) ([place])  Acute congestive heart failure, unspecified congestive heart failure type [facility])    New Prescriptions New Prescriptions   No medications on file     Varney Biles, MD 10/04/16 [phone]

## 2016-10-01 NOTE — ED Notes (Signed)
Dr Kathrynn Humble examined pt utilizing

## 2016-10-02 ENCOUNTER — Encounter (HOSPITAL_COMMUNITY): Payer: Self-pay | Admitting: Internal Medicine

## 2016-10-02 ENCOUNTER — Emergency Department (HOSPITAL_COMMUNITY): Payer: Medicare HMO

## 2016-10-02 ENCOUNTER — Inpatient Hospital Stay (HOSPITAL_COMMUNITY): Payer: Medicare HMO

## 2016-10-02 DIAGNOSIS — E876 Hypokalemia: Secondary | ICD-10-CM | POA: Diagnosis not present

## 2016-10-02 DIAGNOSIS — Z7984 Long term (current) use of oral hypoglycemic drugs: Secondary | ICD-10-CM | POA: Diagnosis not present

## 2016-10-02 DIAGNOSIS — D696 Thrombocytopenia, unspecified: Secondary | ICD-10-CM

## 2016-10-02 DIAGNOSIS — F05 Delirium due to known physiological condition: Secondary | ICD-10-CM | POA: Diagnosis not present

## 2016-10-02 DIAGNOSIS — E870 Hyperosmolality and hypernatremia: Secondary | ICD-10-CM | POA: Diagnosis not present

## 2016-10-02 DIAGNOSIS — I5041 Acute combined systolic (congestive) and diastolic (congestive) heart failure: Secondary | ICD-10-CM | POA: Diagnosis not present

## 2016-10-02 DIAGNOSIS — Z7901 Long term (current) use of anticoagulants: Secondary | ICD-10-CM | POA: Diagnosis not present

## 2016-10-02 DIAGNOSIS — J690 Pneumonitis due to inhalation of food and vomit: Secondary | ICD-10-CM | POA: Diagnosis not present

## 2016-10-02 DIAGNOSIS — I5021 Acute systolic (congestive) heart failure: Secondary | ICD-10-CM | POA: Diagnosis not present

## 2016-10-02 DIAGNOSIS — C9000 Multiple myeloma not having achieved remission: Secondary | ICD-10-CM | POA: Diagnosis present

## 2016-10-02 DIAGNOSIS — I509 Heart failure, unspecified: Secondary | ICD-10-CM

## 2016-10-02 DIAGNOSIS — R5081 Fever presenting with conditions classified elsewhere: Secondary | ICD-10-CM | POA: Diagnosis not present

## 2016-10-02 DIAGNOSIS — N179 Acute kidney failure, unspecified: Secondary | ICD-10-CM

## 2016-10-02 DIAGNOSIS — I5023 Acute on chronic systolic (congestive) heart failure: Secondary | ICD-10-CM | POA: Diagnosis present

## 2016-10-02 DIAGNOSIS — Z79899 Other long term (current) drug therapy: Secondary | ICD-10-CM | POA: Diagnosis not present

## 2016-10-02 DIAGNOSIS — D649 Anemia, unspecified: Secondary | ICD-10-CM

## 2016-10-02 DIAGNOSIS — N170 Acute kidney failure with tubular necrosis: Secondary | ICD-10-CM | POA: Diagnosis not present

## 2016-10-02 DIAGNOSIS — Z955 Presence of coronary angioplasty implant and graft: Secondary | ICD-10-CM | POA: Diagnosis not present

## 2016-10-02 DIAGNOSIS — I2510 Atherosclerotic heart disease of native coronary artery without angina pectoris: Secondary | ICD-10-CM | POA: Diagnosis present

## 2016-10-02 DIAGNOSIS — M899 Disorder of bone, unspecified: Secondary | ICD-10-CM

## 2016-10-02 DIAGNOSIS — Z7902 Long term (current) use of antithrombotics/antiplatelets: Secondary | ICD-10-CM | POA: Diagnosis not present

## 2016-10-02 DIAGNOSIS — N183 Chronic kidney disease, stage 3 (moderate): Secondary | ICD-10-CM | POA: Diagnosis present

## 2016-10-02 DIAGNOSIS — K7460 Unspecified cirrhosis of liver: Secondary | ICD-10-CM | POA: Diagnosis present

## 2016-10-02 DIAGNOSIS — M898X9 Other specified disorders of bone, unspecified site: Secondary | ICD-10-CM

## 2016-10-02 DIAGNOSIS — E538 Deficiency of other specified B group vitamins: Secondary | ICD-10-CM | POA: Diagnosis not present

## 2016-10-02 DIAGNOSIS — K219 Gastro-esophageal reflux disease without esophagitis: Secondary | ICD-10-CM | POA: Diagnosis present

## 2016-10-02 DIAGNOSIS — M545 Low back pain, unspecified: Secondary | ICD-10-CM | POA: Diagnosis present

## 2016-10-02 DIAGNOSIS — I482 Chronic atrial fibrillation: Secondary | ICD-10-CM

## 2016-10-02 DIAGNOSIS — I369 Nonrheumatic tricuspid valve disorder, unspecified: Secondary | ICD-10-CM | POA: Diagnosis not present

## 2016-10-02 DIAGNOSIS — I959 Hypotension, unspecified: Secondary | ICD-10-CM | POA: Diagnosis not present

## 2016-10-02 DIAGNOSIS — D6959 Other secondary thrombocytopenia: Secondary | ICD-10-CM | POA: Diagnosis present

## 2016-10-02 DIAGNOSIS — I130 Hypertensive heart and chronic kidney disease with heart failure and stage 1 through stage 4 chronic kidney disease, or unspecified chronic kidney disease: Secondary | ICD-10-CM | POA: Diagnosis present

## 2016-10-02 DIAGNOSIS — Z7982 Long term (current) use of aspirin: Secondary | ICD-10-CM | POA: Diagnosis not present

## 2016-10-02 DIAGNOSIS — E1122 Type 2 diabetes mellitus with diabetic chronic kidney disease: Secondary | ICD-10-CM | POA: Diagnosis present

## 2016-10-02 DIAGNOSIS — I255 Ischemic cardiomyopathy: Secondary | ICD-10-CM | POA: Diagnosis not present

## 2016-10-02 DIAGNOSIS — I9589 Other hypotension: Secondary | ICD-10-CM | POA: Diagnosis not present

## 2016-10-02 DIAGNOSIS — G4730 Sleep apnea, unspecified: Secondary | ICD-10-CM | POA: Diagnosis present

## 2016-10-02 LAB — BASIC METABOLIC PANEL
Anion gap: 14 (ref 5–15)
Anion gap: 14 (ref 5–15)
BUN: 44 mg/dL — AB (ref 6–20)
BUN: 46 mg/dL — ABNORMAL HIGH (ref 6–20)
CALCIUM: 14.4 mg/dL — AB (ref 8.9–10.3)
CHLORIDE: 105 mmol/L (ref 101–111)
CHLORIDE: 104 mmol/L (ref 101–111)
CO2: 27 mmol/L (ref 22–32)
CO2: 25 mmol/L (ref 22–32)
Calcium: >15 mg/dL (ref 8.9–10.3)
Creatinine, Ser: 2.25 mg/dL — ABNORMAL HIGH (ref 0.61–1.24)
Creatinine, Ser: 2.07 mg/dL — ABNORMAL HIGH (ref 0.61–1.24)
GFR calc Af Amer: 32 mL/min — ABNORMAL LOW (ref >60)
GFR calc Af Amer: 36 mL/min — ABNORMAL LOW (ref >60)
GFR calc non Af Amer: 28 mL/min — ABNORMAL LOW (ref >60)
GFR calc non Af Amer: 31 mL/min — ABNORMAL LOW (ref >60)
GLUCOSE: 93 mg/dL (ref 65–99)
Glucose, Bld: 100 mg/dL — ABNORMAL HIGH (ref 65–99)
POTASSIUM: 3.7 mmol/L (ref 3.5–5.1)
Potassium: 3.1 mmol/L — ABNORMAL LOW (ref 3.5–5.1)
SODIUM: 146 mmol/L — AB (ref 135–145)
Sodium: 143 mmol/L (ref 135–145)

## 2016-10-02 LAB — COMPREHENSIVE METABOLIC PANEL
ALK PHOS: 110 U/L (ref 38–126)
ALT: 17 U/L (ref 17–63)
AST: 35 U/L (ref 15–41)
Albumin: 4.1 g/dL (ref 3.5–5.0)
Anion gap: 10 (ref 5–15)
BUN: 45 mg/dL — AB (ref 6–20)
CALCIUM: 14.9 mg/dL — AB (ref 8.9–10.3)
CHLORIDE: 108 mmol/L (ref 101–111)
CO2: 29 mmol/L (ref 22–32)
Creatinine, Ser: 2.07 mg/dL — ABNORMAL HIGH (ref 0.61–1.24)
GFR calc Af Amer: 36 mL/min — ABNORMAL LOW (ref >60)
GFR, EST NON AFRICAN AMERICAN: 31 mL/min — AB (ref >60)
Glucose, Bld: 95 mg/dL (ref 65–99)
Potassium: 3.2 mmol/L — ABNORMAL LOW (ref 3.5–5.1)
Sodium: 147 mmol/L — ABNORMAL HIGH (ref 135–145)
Total Bilirubin: 1.3 mg/dL — ABNORMAL HIGH (ref 0.3–1.2)
Total Protein: 5.9 g/dL — ABNORMAL LOW (ref 6.5–8.1)

## 2016-10-02 LAB — URINALYSIS, ROUTINE W REFLEX MICROSCOPIC
BILIRUBIN URINE: NEGATIVE
Glucose, UA: NEGATIVE mg/dL
Ketones, ur: NEGATIVE mg/dL
Leukocytes, UA: NEGATIVE
Nitrite: NEGATIVE
PH: 5 (ref 5.0–8.0)
Protein, ur: NEGATIVE mg/dL
SPECIFIC GRAVITY, URINE: 1.012 (ref 1.005–1.030)

## 2016-10-02 LAB — RETICULOCYTES
RBC.: 2.72 MIL/uL — ABNORMAL LOW (ref 4.22–5.81)
Retic Count, Absolute: 40.8 10*3/uL (ref 19.0–186.0)
Retic Ct Pct: 1.5 % (ref 0.4–3.1)

## 2016-10-02 LAB — IRON AND TIBC
IRON: 59 ug/dL (ref 45–182)
SATURATION RATIOS: 12 % — AB (ref 17.9–39.5)
TIBC: 479 ug/dL — AB (ref 250–450)
UIBC: 420 ug/dL

## 2016-10-02 LAB — GLUCOSE, CAPILLARY
GLUCOSE-CAPILLARY: 105 mg/dL — AB (ref 65–99)
GLUCOSE-CAPILLARY: 135 mg/dL — AB (ref 65–99)
Glucose-Capillary: 114 mg/dL — ABNORMAL HIGH (ref 65–99)

## 2016-10-02 LAB — POC OCCULT BLOOD, ED: FECAL OCCULT BLD: NEGATIVE

## 2016-10-02 LAB — TROPONIN I
TROPONIN I: 0.05 ng/mL — AB (ref <0.03)
TROPONIN I: 0.06 ng/mL — AB (ref <0.03)
TROPONIN I: 0.06 ng/mL — AB (ref <0.03)

## 2016-10-02 LAB — POCT I-STAT TROPONIN I: Troponin i, poc: 0.09 ng/mL (ref 0.00–0.08)

## 2016-10-02 LAB — PROTIME-INR
INR: 3.96
Prothrombin Time: 39.7 seconds — ABNORMAL HIGH (ref 11.4–15.2)

## 2016-10-02 LAB — FERRITIN: FERRITIN: 101 ng/mL (ref 24–336)

## 2016-10-02 LAB — PHOSPHORUS: Phosphorus: 4.6 mg/dL (ref 2.5–4.6)

## 2016-10-02 LAB — LACTATE DEHYDROGENASE: LDH: 281 U/L — AB (ref 98–192)

## 2016-10-02 LAB — PSA: PSA: 2.9 ng/mL (ref 0.00–4.00)

## 2016-10-02 MED ORDER — ATORVASTATIN CALCIUM 40 MG PO TABS
40.0000 mg | ORAL_TABLET | Freq: Every day | ORAL | Status: DC
  Administered 2016-10-02 – 2016-10-07 (×6): 40 mg via ORAL
  Filled 2016-10-02 (×6): qty 1

## 2016-10-02 MED ORDER — OXYCODONE HCL 5 MG PO TABS
10.0000 mg | ORAL_TABLET | Freq: Four times a day (QID) | ORAL | Status: DC
  Administered 2016-10-02 – 2016-10-04 (×7): 10 mg via ORAL
  Filled 2016-10-02 (×8): qty 2

## 2016-10-02 MED ORDER — CLOPIDOGREL BISULFATE 75 MG PO TABS
75.0000 mg | ORAL_TABLET | Freq: Every day | ORAL | Status: DC
  Filled 2016-10-02: qty 1

## 2016-10-02 MED ORDER — ACETAMINOPHEN 650 MG RE SUPP
650.0000 mg | Freq: Four times a day (QID) | RECTAL | Status: DC | PRN
  Administered 2016-10-04: 650 mg via RECTAL
  Filled 2016-10-02: qty 1

## 2016-10-02 MED ORDER — ONDANSETRON HCL 4 MG/2ML IJ SOLN
4.0000 mg | Freq: Four times a day (QID) | INTRAMUSCULAR | Status: DC | PRN
Start: 2016-10-02 — End: 2016-10-08

## 2016-10-02 MED ORDER — FUROSEMIDE 10 MG/ML IJ SOLN
40.0000 mg | Freq: Two times a day (BID) | INTRAMUSCULAR | Status: DC
  Administered 2016-10-02 – 2016-10-03 (×4): 40 mg via INTRAVENOUS
  Filled 2016-10-02 (×4): qty 4

## 2016-10-02 MED ORDER — SODIUM CHLORIDE 0.9 % IV SOLN: INTRAVENOUS | Status: DC

## 2016-10-02 MED ORDER — INSULIN ASPART 100 UNIT/ML ~~LOC~~ SOLN
0.0000 [IU] | Freq: Three times a day (TID) | SUBCUTANEOUS | Status: DC
  Administered 2016-10-04 (×2): 1 [IU] via SUBCUTANEOUS

## 2016-10-02 MED ORDER — CALCITONIN (SALMON) 200 UNIT/ACT NA SOLN: 1.0000 | Freq: Two times a day (BID) | NASAL | Status: DC

## 2016-10-02 MED ORDER — CALCITONIN (SALMON) 200 UNIT/ML IJ SOLN
400.0000 [IU] | Freq: Two times a day (BID) | INTRAMUSCULAR | Status: AC
  Administered 2016-10-02 – 2016-10-04 (×4): 400 [IU] via SUBCUTANEOUS
  Filled 2016-10-02 (×5): qty 2

## 2016-10-02 MED ORDER — CALCITONIN (SALMON) 200 UNIT/ACT NA SOLN
1.0000 | Freq: Every day | NASAL | Status: DC
  Administered 2016-10-02: 1 via NASAL
  Filled 2016-10-02: qty 3.7

## 2016-10-02 MED ORDER — POTASSIUM CHLORIDE CRYS ER 20 MEQ PO TBCR
40.0000 meq | EXTENDED_RELEASE_TABLET | ORAL | Status: AC
  Administered 2016-10-02 (×2): 40 meq via ORAL
  Filled 2016-10-02 (×2): qty 2

## 2016-10-02 MED ORDER — CARVEDILOL 6.25 MG PO TABS
6.2500 mg | ORAL_TABLET | Freq: Two times a day (BID) | ORAL | Status: DC
  Administered 2016-10-02 – 2016-10-08 (×9): 6.25 mg via ORAL
  Filled 2016-10-02 (×9): qty 1

## 2016-10-02 MED ORDER — ENSURE ENLIVE PO LIQD
237.0000 mL | Freq: Two times a day (BID) | ORAL | Status: DC
  Administered 2016-10-02 – 2016-10-07 (×5): 237 mL via ORAL

## 2016-10-02 MED ORDER — SODIUM CHLORIDE 0.9 % IV SOLN
Freq: Once | INTRAVENOUS | Status: AC
Start: 2016-10-02 — End: 2016-10-02
  Administered 2016-10-02: 1000 mL via INTRAVENOUS

## 2016-10-02 MED ORDER — CLOPIDOGREL BISULFATE 75 MG PO TABS
75.0000 mg | ORAL_TABLET | Freq: Every day | ORAL | Status: DC
  Administered 2016-10-02 – 2016-10-08 (×7): 75 mg via ORAL
  Filled 2016-10-02 (×6): qty 1

## 2016-10-02 MED ORDER — SODIUM CHLORIDE 0.9 % IV SOLN
60.0000 mg | Freq: Once | INTRAVENOUS | Status: AC
  Administered 2016-10-02: 60 mg via INTRAVENOUS
  Filled 2016-10-02 (×2): qty 20

## 2016-10-02 MED ORDER — ONDANSETRON HCL 4 MG PO TABS: 4.0000 mg | ORAL_TABLET | Freq: Four times a day (QID) | ORAL | Status: DC | PRN

## 2016-10-02 MED ORDER — ACETAMINOPHEN 325 MG PO TABS: 650.0000 mg | ORAL_TABLET | Freq: Four times a day (QID) | ORAL | Status: DC | PRN

## 2016-10-02 MED ORDER — SODIUM CHLORIDE 0.9 % IV BOLUS (SEPSIS): 250.0000 mL | Freq: Once | INTRAVENOUS | Status: DC

## 2016-10-02 MED ORDER — CARVEDILOL 6.25 MG PO TABS: 18.7500 mg | ORAL_TABLET | Freq: Two times a day (BID) | ORAL | Status: DC

## 2016-10-02 NOTE — Progress Notes (Signed)
Patient arrived to 2w06 via stretcher. Wife at bedside. Tele placed, CCMD verified x2. Pt currently denies SOB or pain at this time. VSS. Pt and wife updated on plan of care. Admitting MD paged, awaiting new orders. Will continue to monitor.

## 2016-10-02 NOTE — Progress Notes (Signed)
Patient ID: Richard Vang, male   DOB: February 13, 1946, 70 y.o.   MRN: [id number] Request received for bone lesion biopsy to rule out multiple myeloma in this patient who was just admitted secondary to back pain. Dr. Kathlene Cote has reviewed his imaging.  He does NOT recommend a bone lesion biopsy as the patient does not have a specific lesion to biopsy.  He has diffuse bony demineralization throughout.  We recommend and hem/onc consult for a multiple myeloma work up.  This is not diagnosed based off of a bone biopsy.  He will likely require a lumbar MRI with and without contrast to evaluate the marrow signal as well as SPEP testing, which appears to be ordered from the admitting note.  At some point, he may require a bone marrow biopsy, but that is also not the first step in this work up.  I will call and discuss this with the hospitalist team.  Zariah Cavendish E 8:47 AM 10/02/2016

## 2016-10-02 NOTE — Consult Note (Addendum)
Richard Vang Kitchen    HEMATOLOGY/ONCOLOGY CONSULTATION NOTE  Date of Service: 10/02/2016  Patient Care Team: Sandi Mariscal, MD as PCP - General (Internal Medicine)  CHIEF COMPLAINTS/PURPOSE OF CONSULTATION:  Back pain and shortness of breath.  HISTORY OF PRESENTING ILLNESS:  Richard Vang is a wonderful 70 y.o. male who has been referred to [name] by Dr Debbe Odea, MD for evaluation and management of new findings of lytic lesions in the bone and hypercalcemia thought to be related to malignancy. Overall concern for multiple myeloma.  Patient has a history of hypertension, dyslipidemia, sleep apnea, diabetes, coronary artery disease status post drug-eluting PCI in the proximal LAD on 07/31/2016 on dual antiplatelet therapy and Coumadin, systolic heart failure with ejection fraction of 25-35% on cath in October 2017. Patient presented to his primary care physician with increasing shortness of breath and was referred to his cardiologist who eventually referred him to the emergency room for further evaluation and management.  EKG showed atrial fibrillation with PVCs. Patient reported increasing your back pain over the last 6 weeks even at rest. He had labs which showed anemia, hypercalcemia with a calcium of more than 15 and acute on chronic renal failure. CT of the lumbar spine and abdomen showed possible diffuse lytic lesions in the spine, multiage indeterminate compression deformity most prominent at L4. Lytic lesion with aggressive features in the right leg bone concerning for malignancy. Patient was admitted for further evaluation and management.  I met with the patient and his wife and other family members at bedside and discussed the imaging findings and diagnostic possibilities and recommendations for workup. We also discussed the complexity of his cares including his recent cardiac issues and stenting and need for multiple "blood thinners" we discussed the likely need for PRBC transfusion. The concern with his  hypercalcemia and multiple other clinical considerations. He notes that his low back pain is better controlled. All her questions were answered in great details to their apparent satisfaction.  MEDICAL HISTORY:  Past Medical History:  Diagnosis Date  . Arthritis    "left shoulder" (07/31/2016)  . Coronary artery disease   . GERD (gastroesophageal reflux disease)   . Heart murmur   . High cholesterol   . Hypertension   . Sleep apnea    "probably; having test in November" (07/31/2016)  . Type II diabetes mellitus ([place])   Coronary artery disease status post drug-eluting PCI of the proximal LAD on 07/31/2016. CHF ejection fraction 25-35% on cath. Atrial fibrillation on Coumadin Possible liver cirrhosis based on imaging Nonobstructing bilateral renal stones Left renal inferior pole high attenuating lesion, incompletely characterized, likely a complex/hemorrhagic cyst.  SURGICAL HISTORY: Past Surgical History:  Procedure Laterality Date  . CARDIAC CATHETERIZATION N/A 07/31/2016   Procedure: Left Heart Cath and Coronary Angiography;  Surgeon: Lorretta Harp, MD;  Location: [facility];  Service: Cardiovascular;  Laterality: N/A;  . CARDIAC CATHETERIZATION N/A 07/31/2016   Procedure: Coronary Stent Intervention;  Surgeon: Lorretta Harp, MD;  Location: [facility];  Service: Cardiovascular;  Laterality: N/A;  . CORONARY ANGIOPLASTY    . SHOULDER SURGERY Left 1973   "put pin in it where it had separated"   . TONSILLECTOMY  ~ 1956    SOCIAL HISTORY: Social History   Social History  . Marital status: Divorced    Spouse name: N/A  . Number of children: N/A  . Years of education: N/A   Occupational History  . Not on file.   Social History  Main Topics  . Smoking status: Never Smoker  . Smokeless tobacco: Never Used  . Alcohol use Yes     Comment: 07/31/2016 "nothing since 2002"  . Drug use: No  . Sexual activity: Not Currently   Other Topics Concern  .  Not on file   Social History Narrative  . No narrative on file    FAMILY HISTORY: Family History  Problem Relation Age of Onset  . Hypertension Other     ALLERGIES:  has No Known Allergies.  MEDICATIONS:  Current Facility-Administered Medications  Medication Dose Route Frequency Provider Last Rate Last Dose  . acetaminophen (TYLENOL) tablet 650 mg  650 mg Oral Q6H PRN Rise Patience, MD       Or  . acetaminophen (TYLENOL) suppository 650 mg  650 mg Rectal Q6H PRN Rise Patience, MD      . atorvastatin (LIPITOR) tablet 40 mg  40 mg Oral QHS Rise Patience, MD      . calcitonin (MIACALCIN) injection 400 Units  400 Units Subcutaneous Q12H Saima Rizwan, MD      . carvedilol (COREG) tablet 6.25 mg  6.25 mg Oral BID WC Debbe Odea, MD      . clopidogrel (PLAVIX) tablet 75 mg  75 mg Oral Daily Debbe Odea, MD   75 mg at 10/02/16 0905  . feeding supplement (ENSURE ENLIVE) (ENSURE ENLIVE) liquid 237 mL  237 mL Oral BID BM Rise Patience, MD      . furosemide (LASIX) injection 40 mg  40 mg Intravenous BID Rise Patience, MD   40 mg at 10/02/16 0905  . insulin aspart (novoLOG) injection 0-9 Units  0-9 Units Subcutaneous TID WC Rise Patience, MD      . ondansetron [facility]) tablet 4 mg  4 mg Oral Q6H PRN Rise Patience, MD       Or  . ondansetron [facility]) injection 4 mg  4 mg Intravenous Q6H PRN Rise Patience, MD      . oxyCODONE (Oxy IR/ROXICODONE) immediate release tablet 10 mg  10 mg Oral Q6H Debbe Odea, MD   10 mg at 10/02/16 0905  . pamidronate (AREDIA) 60 mg in sodium chloride 0.9 % 500 mL IVPB  60 mg Intravenous Once Rise Patience, MD   60 mg at 10/02/16 1316  . potassium chloride SA (K-DUR,KLOR-CON) CR tablet 40 mEq  40 mEq Oral Q4H Saima Rizwan, MD      . sodium chloride 0.9 % bolus 250 mL  250 mL Intravenous Once Rise Patience, MD        REVIEW OF SYSTEMS:    10 Point review of Systems was done is negative except as noted  above.  PHYSICAL EXAMINATION: ECOG PERFORMANCE STATUS: 2 - Symptomatic, <50% confined to bed  . Vitals:   10/02/16 0722 10/02/16 1327  BP: 129/69 114/66  Pulse: 64 70  Resp: 20 17  Temp: 97.9 F (36.6 C) 98.7 F (37.1 C)   Filed Weights   10/01/16 1717 10/02/16 0722  Weight: 260 lb (117.9 kg) 235 lb 3.2 oz (106.7 kg)   .Body mass index is 33.75 kg/m.  GENERAL:alert, Patient appears older than his age  SKIN: skin color, texture, turgor are normal, no rashes or significant lesions EYES: normal, conjunctiva are pink and non-injected, sclera clear OROPHARYNX:no exudate, no erythema and lips, buccal mucosa, and tongue normal  NECK: supple, no JVD, thyroid normal size, non-tender, without nodularity LYMPH:  no palpable lymphadenopathy in the  cervical, axillary or inguinal LUNGS: Decreased entry bilaterally, few basal rales noted HEART: S1-S2 irreg ABDOMEN: abdomen soft, non-tender, normoactive bowel sounds, no hepatosplenomegaly palpable PSYCH: alert & oriented x 3 with fluent speech NEURO: no focal motor/sensory deficits  LABORATORY DATA:  I have reviewed the data as listed  . CBC Latest Ref Rng & Units 10/01/2016 08/01/2016 07/18/2016  WBC 4.0 - 10.5 K/uL 4.1 4.0 4.7  Hemoglobin 13.0 - 17.0 g/dL 8.4(L) 11.6(L) 11.7(L)  Hematocrit 39.0 - 52.0 % 27.4(L) 36.9(L) 36.1(L)  Platelets 150 - 400 K/uL 120(L) 113(L) 132(L)   . CBC    Component Value Date/Time   WBC 4.1 10/01/2016 1729   RBC 2.72 (L) 10/02/2016 1507   RBC 2.71 (L) 10/01/2016 1729   HGB 8.4 (L) 10/01/2016 1729   HCT 27.4 (L) 10/01/2016 1729   PLT 120 (L) 10/01/2016 1729   MCV 101.1 (H) 10/01/2016 1729   MCH 31.0 10/01/2016 1729   MCHC 30.7 10/01/2016 1729   RDW 18.0 (H) 10/01/2016 1729   LYMPHSABS 1,034 07/18/2016 1211   MONOABS 564 07/18/2016 1211   EOSABS 47 07/18/2016 1211   BASOSABS 0 07/18/2016 1211    . CMP Latest Ref Rng & Units 10/02/2016 10/01/2016 08/01/2016  Glucose 65 - 99 mg/dL 93 100(H)  107(H)  BUN 6 - 20 mg/dL 46(H) 44(H) 20  Creatinine 0.61 - 1.24 mg/dL 2.07(H) 2.25(H) 1.37(H)  Sodium 135 - 145 mmol/L 143 146(H) 143  Potassium 3.5 - 5.1 mmol/L 3.1(L) 3.7 4.1  Chloride 101 - 111 mmol/L 104 105 112(H)  CO2 22 - 32 mmol/L '25 27 23  ' Calcium 8.9 - 10.3 mg/dL 14.4(HH) >15.0(HH) 9.1    RADIOGRAPHIC STUDIES: I have personally reviewed the radiological images as listed and agreed with the findings in the report. Ct Abdomen Pelvis Wo Contrast  Result Date: 10/02/2016 CLINICAL DATA:  70 year old male with abdominal pain and back pain x5 weeks. EXAM: CT ABDOMEN AND PELVIS WITHOUT CONTRAST TECHNIQUE: Multidetector CT imaging of the abdomen and pelvis was performed following the standard protocol without IV contrast. COMPARISON:  Abdominal CT dated 11/03/2010 FINDINGS: Evaluation of this exam is limited in the absence of intravenous contrast. Lower chest: Partially visualized moderate right pleural effusion with associated partial compressive atelectasis of the right lower lobe. Superimposed pneumonia is not excluded. Clinical correlation is recommended. The pleural effusion is new since the study of 20/12 2012. There is coronary vascular calcification. Top-normal cardiac size. There is hypoattenuation of the cardiac blood pool suggestive of a degree of anemia. Clinical correlation is recommended. No intra-abdominal free air. There is diffuse mesenteric stranding and small ascites. Hepatobiliary: Cirrhosis. There is no intrahepatic biliary ductal dilatation. Stones noted within the gallbladder. The gallbladder is predominantly contracted. Mild haziness of the gallbladder wall likely related to cirrhosis and ascites. Ultrasound may provide better evaluation if there is clinical concern for acute cholecystitis. Pancreas: Unremarkable. No pancreatic ductal dilatation or surrounding inflammatory changes. Spleen: Normal in size without focal abnormality. Adrenals/Urinary Tract: The adrenal glands  appear unremarkable. Multiple nonobstructing bilateral renal calculi measuring up to 4 mm. There is no hydronephrosis on either side. A 2 cm left renal inferior pole exophytic high attenuating lesions is incompletely characterized but most likely represents a complex/hemorrhagic cyst. Further evaluation with ultrasound is recommended. The visualized ureters and urinary bladder appear unremarkable. Stomach/Bowel: There is diverticulosis of the second portion of the duodenum at the head of the pancreas measuring up to 3.5 cm. No definite associated active inflammatory changes. Mild thickened appearance  of the colonic folds may be related to underdistention or hepatic colopathy. Mild colitis is less likely but not entirely excluded. Correlation with clinical exam and stool cultures recommended. There is no evidence of bowel obstruction. Normal appendix. Vascular/Lymphatic: There is advanced aortoiliac atherosclerotic disease. There is no aneurysmal dilatation. The IVC is grossly unremarkable. Evaluation of the vasculature is limited in the absence of intravenous contrast. No portal venous gas identified. There is no adenopathy. Reproductive: Mild enlargement of the prostate gland measuring up to 5.3 cm in transverse axial diameter. Other: Small fat containing right inguinal hernia. There is mild diffuse subcutaneous edema and anasarca. No fluid collection. Musculoskeletal: There is osteopenia with multilevel degenerative changes of the spine. Multilevel old-appearing compression deformity most prominent involving the L4 with approximately 30% loss of vertebral body height. No definite acute fracture. A 4.0 x 2.4 cm lytic lesion involving the right iliac bone demonstrates aggressive features suggest cortical breakthrough and is concerning for malignancy. Further evaluation with MRI is recommended. IMPRESSION: Cirrhosis with small ascites and mild anasarca. Cholelithiasis. Duodenal diverticulosis. Hepatic colopathy  versus less likely mild colitis. Correlation with clinical exam and stool cultures recommended. No bowel obstruction. Normal appendix. Moderate right pleural effusion with partial compressive atelectasis of the right lower lobe. Lytic lesion in the right iliac bone with associated cortical breakthrough. MRI is recommended for further evaluation. Nonobstructing bilateral renal calculi.  No hydronephrosis. Left renal inferior pole high attenuating lesion, incompletely characterized, likely a complex/hemorrhagic cyst. Ultrasound is recommended for further characterization. Electronically Signed   By: Anner Crete M.D.   On: 10/02/2016 01:30   Ct L-spine No Charge  Result Date: 10/02/2016 CLINICAL DATA:  70 year old male with back pain x5 weeks. EXAM: CT LUMBAR SPINE WITHOUT CONTRAST TECHNIQUE: Multidetector CT imaging of the lumbar spine was performed without intravenous contrast administration. Multiplanar CT image reconstructions were also generated. COMPARISON:  Abdominal CT dated 10/02/2016 and 11/03/2010 FINDINGS: Segmentation: 5 lumbar type vertebrae. Alignment: Normal. Vertebrae: There is advanced osteopenia suspicious for diffuse lytic metastatic process or underlying marrow abnormality or myeloma. Correlation with clinical exam and further evaluation with MRI or bone scan is recommended. Multilevel age indeterminate compression deformity involving T10, T11, T12, L3 and L4 noted. There is approximately 25% loss of vertebral body height at L4. An acute fracture involving L4 is not excluded. Evaluation is very limited due to advanced osteopenia. Correlation with clinical exam and point tenderness and further evaluation with MRI if clinically indicated recommended. No retropulsed fragment identified. There is endplate irregularity and Schmorl node involving the superior endplate of the L5. A lytic lesion with cortical breakthrough noted in the right iliac bone. Paraspinal and other soft tissues: Bilateral  renal stones, aortoiliac atherosclerotic disease, and small ascites as seen on the CT. The paraspinal soft tissues appear unremarkable. Partially visualized right pleural effusion. Disc levels: Multilevel disc disease with endplate irregularity. IMPRESSION: Advanced osseous demineralization may be related to diffuse lytic metastatic disease or myeloma. Correlation with clinical exam and further evaluation with MRI or bone scan recommended. Evaluation for acute fracture is very limited due to osteopenia. Multilevel age indeterminate compression deformity most prominent at L4. An acute fracture involving the L4 vertebra is not entirely excluded. Correlation with point tenderness recommended. No retropulsed fragment. Lytic lesion with aggressive features in the right iliac bone concerning for malignancy. Further evaluation with MRI or bone scan recommended. Partially visualized right pleural effusion. Electronically Signed   By: Anner Crete M.D.   On: 10/02/2016 03:41   Dg  Chest Port 1 View  Result Date: 10/02/2016 CLINICAL DATA:  70 y/o  M; shortness of breath. EXAM: PORTABLE CHEST 1 VIEW COMPARISON:  11/03/2010 chest radiograph. FINDINGS: Cardiomegaly appears increased in comparison with prior radiographs given projection and technique. Prominent interstitial markings. Right basilar opacity obscures the right heart border. No definite pleural effusion. Bones are unremarkable. IMPRESSION: Increased cardiomegaly from prior radiographs may represent heart failure or pericardial effusion. Increased interstitial markings and opacity partially obscuring the right heart border probably represents pulmonary edema. Pneumonia is not excluded. Electronically Signed   By: Kristine Garbe M.D.   On: 10/02/2016 00:21    ASSESSMENT & PLAN:   70 year old male with multiple medical co-morbidities including hypertension, diabetes, dyslipidemia, coronary artery disease status post drug-eluting PCI on 07/31/2016  and newly noted possibly ischemic cardiomyopathy ejection fraction 25-35% admitted with.  1)  Lytic lesion with aggressive features in the right iliac bone and possibly other lytic lesions in the L spine  Concerning for metastatic disease worse his multiple myeloma.  2) severe hypercalcemia calcium a more than 15 now down to 14.4. This is likely due to malignancy.  3) CAD s/p prox LAD DES PCI on 07/31/2016 with ischemic cardiomyopathy ejection fraction of 25-35%  4) Macrocytic Anemia with mild thrombocytopenia  5) AKI on CKD -- related to hypercalcemia vs ?cardiorenal syndrome vs MM PLAN  -Myeloma panel with SPEP, quantitative immunoglobulins and immunofixation electrophoresis -Serum kappa lambda free light chains -24h UPEP -Beta-2 microglobulin -LDH -PSA -whole body skeletal survey -consider CT chest without contrast to r/o other primary lesions and mets -US kidney to evaluate complex cyst -IR consult for CT guided Bone U/L Marrow biopsy + if possible biopsy of lytic iliac bone lesion. -Patient on dual antiplatelet therapy and Coumadin - will need to make IR aware of some increased risk of bleeding. -Patient has received  IV Pamidronate for hypercalcemia and is getting calcitonin -Is receiving saline assisted diuresis  -Could consider dexamethasone 5 mg by mouth q7AM and q2pm for bone pain and hypercalcemia. -Transfuse when necessary to keep the hemoglobin close to/above 9  given his significant cardiac comorbidities. -Further management as per hospital medicine Dr including coumadin mx  -Definitive recommendations based on above work up results  All of the patients questions were answered with apparent satisfaction. The patient knows to call the clinic with any problems, questions or concerns.  I spent 60 minutes counseling the patient face to face. The total time spent in the appointment was 80 minutes and more than 50% was on counseling and direct patient cares.    Sullivan Lone MD [place] AAHIVMS [facility] Hematology/Oncology Physician [facility]  (Office):       [phone] (Work cell):  [phone] (Fax):           [phone]  10/02/2016 3:26 PM

## 2016-10-02 NOTE — Consult Note (Signed)
Chief Complaint: Patient was seen in consultation today for bone marrow biopsy Chief Complaint  Patient presents with  . Abnormal Lab   at the request of Dr Reggy Eye  Referring Physician(s): Dr Wynelle Cleveland  Supervising Physician: Aletta Edouard  Patient Status: [facility] [place] - In-pt  History of Present Illness: Richard Vang is a 70 y.o. male   CAD with recent stenting 07/2016 On Plavix actively A fib-- ;ast dose Coumadin 12/13; INR 12/14 3.96 Low back pain - worsening x 6 weeks SOB Presented to ED with back pain and SOB Lab work in the ER show hypercalcemia SPEP and UPEP per Oncology CT L spine today IMPRESSION: Advanced osseous demineralization may be related to diffuse lytic metastatic disease or myeloma. Correlation with clinical exam and further evaluation with MRI or bone scan recommended. Evaluation for acute fracture is very limited due to osteopenia. Multilevel age indeterminate compression deformity most prominent at L4. An acute fracture involving the L4 vertebra is not entirely excluded. Correlation with point tenderness recommended. No retropulsed fragment. Lytic lesion with aggressive features in the right iliac bone concerning for malignancy. Further evaluation with MRI or bone scan Recommended.  Now request for BM bx in Radiology   Past Medical History:  Diagnosis Date  . Arthritis    "left shoulder" (07/31/2016)  . Coronary artery disease   . GERD (gastroesophageal reflux disease)   . Heart murmur   . High cholesterol   . Hypertension   . Sleep apnea    "probably; having test in November" (07/31/2016)  . Type II diabetes mellitus ([place])     Past Surgical History:  Procedure Laterality Date  . CARDIAC CATHETERIZATION N/A 07/31/2016   Procedure: Left Heart Cath and Coronary Angiography;  Surgeon: Lorretta Harp, MD;  Location: [place] [facility];  Service: Cardiovascular;  Laterality: N/A;  . CARDIAC CATHETERIZATION N/A 07/31/2016   Procedure:  Coronary Stent Intervention;  Surgeon: Lorretta Harp, MD;  Location: [place] [facility];  Service: Cardiovascular;  Laterality: N/A;  . CORONARY ANGIOPLASTY    . SHOULDER SURGERY Left 1973   "put pin in it where it had separated"   . TONSILLECTOMY  ~ 1956    Allergies: Patient has no known allergies.  Medications: Prior to Admission medications   Medication Sig Start Date End Date Taking? Authorizing Provider  atorvastatin (LIPITOR) 40 MG tablet Take 1 tablet (40 mg total) by mouth at bedtime. 08/01/16  Yes Arbutus Leas, NP  carvedilol (COREG) 12.5 MG tablet Take 1.5 tablets (18.75 mg total) by mouth 2 (two) times daily with a meal. 08/01/16  Yes Arbutus Leas, NP  Cholecalciferol (VITAMIN D3) 5000 units CAPS Take 1 capsule by mouth daily.    Yes Historical Provider, MD  clopidogrel (PLAVIX) 75 MG tablet Take 1 tablet (75 mg total) by mouth daily with breakfast. 08/02/16  Yes Arbutus Leas, NP  furosemide (LASIX) 40 MG tablet Take 40 mg by mouth 2 (two) times daily.   Yes Historical Provider, MD  ibuprofen (ADVIL,MOTRIN) 200 MG tablet Take 200 mg by mouth every 6 (six) hours as needed for moderate pain.   Yes Historical Provider, MD  lisinopril (PRINIVIL,ZESTRIL) 20 MG tablet Take 20 mg by mouth every morning.  07/02/16  Yes Historical Provider, MD  metFORMIN (GLUCOPHAGE-XR) 500 MG 24 hr tablet Take 1,000 mg by mouth daily with breakfast.  04/14/16  Yes Historical Provider, MD  oxyCODONE (OXY IR/ROXICODONE) 5 MG immediate release tablet Take 5 mg by  mouth every 4 (four) hours as needed for severe pain.   Yes Historical Provider, MD  potassium chloride SA (K-DUR,KLOR-CON) 20 MEQ tablet Take 20 mEq by mouth 2 (two) times daily.   Yes Historical Provider, MD  warfarin (COUMADIN) 6 MG tablet Take 6 mg by mouth every morning.  06/05/16  Yes Historical Provider, MD  aspirin EC 81 MG tablet Take 1 tablet (81 mg total) by mouth daily. Patient not taking: Reported on 10/01/2016 08/01/16   Arbutus Leas,  NP     Family History  Problem Relation Age of Onset  . Hypertension Other     Social History   Social History  . Marital status: Divorced    Spouse name: N/A  . Number of children: N/A  . Years of education: N/A   Social History Main Topics  . Smoking status: Never Smoker  . Smokeless tobacco: Never Used  . Alcohol use Yes     Comment: 07/31/2016 "nothing since 2002"  . Drug use: No  . Sexual activity: Not Currently   Other Topics Concern  . None   Social History Narrative  . None    Review of Systems: A 12 point ROS discussed and pertinent positives are indicated in the HPI above.  All other systems are negative.  Review of Systems  Constitutional: Positive for activity change, appetite change and fatigue. Negative for fever and unexpected weight change.  Respiratory: Positive for shortness of breath. Negative for wheezing.   Cardiovascular: Positive for chest pain.  Gastrointestinal: Negative for abdominal pain.  Musculoskeletal: Positive for gait problem.  Neurological: Positive for weakness.  Psychiatric/Behavioral: Negative for behavioral problems and confusion.    Vital Signs: BP 114/66 (BP Location: Right Arm)   Pulse 70   Temp 98.7 F (37.1 C) (Oral)   Resp 17   Ht _0  (1.778 m)   Wt 235 lb 3.2 oz (106.7 kg)   SpO2 95%   BMI 33.75 kg/m   Physical Exam  Constitutional: He is oriented to person, place, and time.  Cardiovascular: Normal rate, regular rhythm and normal heart sounds.   Pulmonary/Chest: Effort normal. He has wheezes.  Abdominal: Soft. Bowel sounds are normal.  Musculoskeletal: Normal range of motion.  Neurological: He is alert and oriented to person, place, and time.  Skin: Skin is warm and dry.  Psychiatric: He has a normal mood and affect. His behavior is normal. Judgment and thought content normal.  Nursing note and vitals reviewed.   Mallampati Score:  MD Evaluation Airway: WNL Heart: WNL Abdomen: WNL Chest/ Lungs:  WNL ASA  Classification: 3 Mallampati/Airway Score: Two  Imaging: Ct Abdomen Pelvis Wo Contrast  Result Date: 10/02/2016 CLINICAL DATA:  70 year old male with abdominal pain and back pain x5 weeks. EXAM: CT ABDOMEN AND PELVIS WITHOUT CONTRAST TECHNIQUE: Multidetector CT imaging of the abdomen and pelvis was performed following the standard protocol without IV contrast. COMPARISON:  Abdominal CT dated 11/03/2010 FINDINGS: Evaluation of this exam is limited in the absence of intravenous contrast. Lower chest: Partially visualized moderate right pleural effusion with associated partial compressive atelectasis of the right lower lobe. Superimposed pneumonia is not excluded. Clinical correlation is recommended. The pleural effusion is new since the study of 20/12 2012. There is coronary vascular calcification. Top-normal cardiac size. There is hypoattenuation of the cardiac blood pool suggestive of a degree of anemia. Clinical correlation is recommended. No intra-abdominal free air. There is diffuse mesenteric stranding and small ascites. Hepatobiliary: Cirrhosis. There is no intrahepatic biliary  ductal dilatation. Stones noted within the gallbladder. The gallbladder is predominantly contracted. Mild haziness of the gallbladder wall likely related to cirrhosis and ascites. Ultrasound may provide better evaluation if there is clinical concern for acute cholecystitis. Pancreas: Unremarkable. No pancreatic ductal dilatation or surrounding inflammatory changes. Spleen: Normal in size without focal abnormality. Adrenals/Urinary Tract: The adrenal glands appear unremarkable. Multiple nonobstructing bilateral renal calculi measuring up to 4 mm. There is no hydronephrosis on either side. A 2 cm left renal inferior pole exophytic high attenuating lesions is incompletely characterized but most likely represents a complex/hemorrhagic cyst. Further evaluation with ultrasound is recommended. The visualized ureters and urinary  bladder appear unremarkable. Stomach/Bowel: There is diverticulosis of the second portion of the duodenum at the head of the pancreas measuring up to 3.5 cm. No definite associated active inflammatory changes. Mild thickened appearance of the colonic folds may be related to underdistention or hepatic colopathy. Mild colitis is less likely but not entirely excluded. Correlation with clinical exam and stool cultures recommended. There is no evidence of bowel obstruction. Normal appendix. Vascular/Lymphatic: There is advanced aortoiliac atherosclerotic disease. There is no aneurysmal dilatation. The IVC is grossly unremarkable. Evaluation of the vasculature is limited in the absence of intravenous contrast. No portal venous gas identified. There is no adenopathy. Reproductive: Mild enlargement of the prostate gland measuring up to 5.3 cm in transverse axial diameter. Other: Small fat containing right inguinal hernia. There is mild diffuse subcutaneous edema and anasarca. No fluid collection. Musculoskeletal: There is osteopenia with multilevel degenerative changes of the spine. Multilevel old-appearing compression deformity most prominent involving the L4 with approximately 30% loss of vertebral body height. No definite acute fracture. A 4.0 x 2.4 cm lytic lesion involving the right iliac bone demonstrates aggressive features suggest cortical breakthrough and is concerning for malignancy. Further evaluation with MRI is recommended. IMPRESSION: Cirrhosis with small ascites and mild anasarca. Cholelithiasis. Duodenal diverticulosis. Hepatic colopathy versus less likely mild colitis. Correlation with clinical exam and stool cultures recommended. No bowel obstruction. Normal appendix. Moderate right pleural effusion with partial compressive atelectasis of the right lower lobe. Lytic lesion in the right iliac bone with associated cortical breakthrough. MRI is recommended for further evaluation. Nonobstructing bilateral  renal calculi.  No hydronephrosis. Left renal inferior pole high attenuating lesion, incompletely characterized, likely a complex/hemorrhagic cyst. Ultrasound is recommended for further characterization. Electronically Signed   By: Anner Crete M.D.   On: 10/02/2016 01:30   Ct L-spine No Charge  Result Date: 10/02/2016 CLINICAL DATA:  69 year old male with back pain x5 weeks. EXAM: CT LUMBAR SPINE WITHOUT CONTRAST TECHNIQUE: Multidetector CT imaging of the lumbar spine was performed without intravenous contrast administration. Multiplanar CT image reconstructions were also generated. COMPARISON:  Abdominal CT dated 10/02/2016 and 11/03/2010 FINDINGS: Segmentation: 5 lumbar type vertebrae. Alignment: Normal. Vertebrae: There is advanced osteopenia suspicious for diffuse lytic metastatic process or underlying marrow abnormality or myeloma. Correlation with clinical exam and further evaluation with MRI or bone scan is recommended. Multilevel age indeterminate compression deformity involving T10, T11, T12, L3 and L4 noted. There is approximately 25% loss of vertebral body height at L4. An acute fracture involving L4 is not excluded. Evaluation is very limited due to advanced osteopenia. Correlation with clinical exam and point tenderness and further evaluation with MRI if clinically indicated recommended. No retropulsed fragment identified. There is endplate irregularity and Schmorl node involving the superior endplate of the L5. A lytic lesion with cortical breakthrough noted in the right iliac bone. Paraspinal and  other soft tissues: Bilateral renal stones, aortoiliac atherosclerotic disease, and small ascites as seen on the CT. The paraspinal soft tissues appear unremarkable. Partially visualized right pleural effusion. Disc levels: Multilevel disc disease with endplate irregularity. IMPRESSION: Advanced osseous demineralization may be related to diffuse lytic metastatic disease or myeloma. Correlation with  clinical exam and further evaluation with MRI or bone scan recommended. Evaluation for acute fracture is very limited due to osteopenia. Multilevel age indeterminate compression deformity most prominent at L4. An acute fracture involving the L4 vertebra is not entirely excluded. Correlation with point tenderness recommended. No retropulsed fragment. Lytic lesion with aggressive features in the right iliac bone concerning for malignancy. Further evaluation with MRI or bone scan recommended. Partially visualized right pleural effusion. Electronically Signed   By: Anner Crete M.D.   On: 10/02/2016 03:41   Dg Chest Port 1 View  Result Date: 10/02/2016 CLINICAL DATA:  70 y/o  M; shortness of breath. EXAM: PORTABLE CHEST 1 VIEW COMPARISON:  11/03/2010 chest radiograph. FINDINGS: Cardiomegaly appears increased in comparison with prior radiographs given projection and technique. Prominent interstitial markings. Right basilar opacity obscures the right heart border. No definite pleural effusion. Bones are unremarkable. IMPRESSION: Increased cardiomegaly from prior radiographs may represent heart failure or pericardial effusion. Increased interstitial markings and opacity partially obscuring the right heart border probably represents pulmonary edema. Pneumonia is not excluded. Electronically Signed   By: Kristine Garbe M.D.   On: 10/02/2016 00:21    Labs:  CBC:  Recent Labs  07/18/16 1211 08/01/16 0403 10/01/16 1729  WBC 4.7 4.0 4.1  HGB 11.7* 11.6* 8.4*  HCT 36.1* 36.9* 27.4*  PLT 132* 113* 120*    COAGS:  Recent Labs  07/18/16 1211 07/31/16 0910 10/01/16 1729 10/02/16 0808  INR 1.7* 1.27 3.03 3.96  APTT 31  --   --   --     BMP:  Recent Labs  07/18/16 1211 08/01/16 0403 10/01/16 1729 10/02/16 0326  NA 143 143 146* 143  K 4.4 4.1 3.7 3.1*  CL 110 112* 105 104  CO2 _0 GLUCOSE 108* 107* 100* 93  BUN 21 20 44* 46*  CALCIUM 9.0 9.1 >15.0* 14.4*    CREATININE 1.26* 1.37* 2.25* 2.07*  GFRNONAA  --  51* 28* 31*  GFRAA  --  59* 32* 36*    LIVER FUNCTION TESTS: No results for input(s): BILITOT, AST, ALT, ALKPHOS, PROT, ALBUMIN in the last 8760 hours.  TUMOR MARKERS: No results for input(s): AFPTM, CEA, CA199, CHROMGRNA in the last 8760 hours.  Assessment and Plan:  Hypercalcemia Lytic lesions on CT Lumbar spine Severe back pain Scheduled for Bone marrow bx in am---worrisome for Multiple Myeloma Risks and Benefits discussed with the patient including, but not limited to bleeding, infection, damage to adjacent structures or low yield requiring additional tests. All of the patient's questions were answered, patient is agreeable to proceed. Consent signed and in chart.   Thank you for this interesting consult.  I greatly enjoyed meeting Richard Vang and look forward to participating in their care.  A copy of this report was sent to the requesting provider on this date.  Electronically Signed: Juda Toepfer A 10/02/2016, 3:37 PM   I spent a total of 40 Minutes    in face to face in clinical consultation, greater than 50% of which was counseling/coordinating care for bone marrow bx

## 2016-10-02 NOTE — H&P (Signed)
History and Physical    EMONI YANG KTG:[id number] DOB: Feb 08, 1946 DOA: 10/01/2016  PCP: Sandi Mariscal, MD  Patient coming from: Home.  Chief Complaint: Low back pain.  HPI: Richard Vang is a 70 y.o. male with history of CAD status post stenting recently in October, atrial fibrillation, cardiomyopathy, diabetes mellitus has been experiencing low back pain over the last 6 weeks. Patient had followed up with his PCP yesterday and was referred to cardiologist because of shortness of breath and patient was eventually referred to the ER because of concerns for possible PE. Patient was complaining of increasing low back pain over the last 6 weeks with even at rest. Denies any incontinence of urine or bowels. Lab work in the ER show hypercalcemia with calcium more than 14. CT scan of the lumbar spine and abdomen shows lytic lesions concerning for multiple myeloma. Patient states he is getting short of breath mostly because of the low back pain. Denies any chest pain. EKG shows A. fib with PVCs.  ED Course: Lab work show hypercalcemia. Patient was started on IV fluids normal saline at 150 mL per hour. CT scan of the abdomen lumbar spine show slight dictations concerning for multiple myeloma.  Review of Systems: As per HPI, rest all negative.   Past Medical History:  Diagnosis Date  . Arthritis    "left shoulder" (07/31/2016)  . Coronary artery disease   . GERD (gastroesophageal reflux disease)   . Heart murmur   . High cholesterol   . Hypertension   . Sleep apnea    "probably; having test in November" (07/31/2016)  . Type II diabetes mellitus ([place])     Past Surgical History:  Procedure Laterality Date  . CARDIAC CATHETERIZATION N/A 07/31/2016   Procedure: Left Heart Cath and Coronary Angiography;  Surgeon: Lorretta Harp, MD;  Location: [facility];  Service: Cardiovascular;  Laterality: N/A;  . CARDIAC CATHETERIZATION N/A 07/31/2016   Procedure: Coronary Stent Intervention;   Surgeon: Lorretta Harp, MD;  Location: [facility];  Service: Cardiovascular;  Laterality: N/A;  . CORONARY ANGIOPLASTY    . SHOULDER SURGERY Left 1973   "put pin in it where it had separated"   . TONSILLECTOMY  ~ 1956     reports that he has never smoked. He has never used smokeless tobacco. He reports that he drinks alcohol. He reports that he does not use drugs.  No Known Allergies  History reviewed. No pertinent family history.  Prior to Admission medications   Medication Sig Start Date End Date Taking? Authorizing Provider  atorvastatin (LIPITOR) 40 MG tablet Take 1 tablet (40 mg total) by mouth at bedtime. 08/01/16  Yes Arbutus Leas, NP  carvedilol (COREG) 12.5 MG tablet Take 1.5 tablets (18.75 mg total) by mouth 2 (two) times daily with a meal. 08/01/16  Yes Arbutus Leas, NP  Cholecalciferol (VITAMIN D3) 5000 units CAPS Take 1 capsule by mouth daily.    Yes Historical Provider, MD  clopidogrel (PLAVIX) 75 MG tablet Take 1 tablet (75 mg total) by mouth daily with breakfast. 08/02/16  Yes Arbutus Leas, NP  furosemide (LASIX) 40 MG tablet Take 40 mg by mouth 2 (two) times daily.   Yes Historical Provider, MD  ibuprofen (ADVIL,MOTRIN) 200 MG tablet Take 200 mg by mouth every 6 (six) hours as needed for moderate pain.   Yes Historical Provider, MD  lisinopril (PRINIVIL,ZESTRIL) 20 MG tablet Take 20 mg by mouth every morning.  07/02/16  Yes Historical Provider, MD  metFORMIN (GLUCOPHAGE-XR) 500 MG 24 hr tablet Take 1,000 mg by mouth daily with breakfast.  04/14/16  Yes Historical Provider, MD  oxyCODONE (OXY IR/ROXICODONE) 5 MG immediate release tablet Take 5 mg by mouth every 4 (four) hours as needed for severe pain.   Yes Historical Provider, MD  potassium chloride SA (K-DUR,KLOR-CON) 20 MEQ tablet Take 20 mEq by mouth 2 (two) times daily.   Yes Historical Provider, MD  warfarin (COUMADIN) 6 MG tablet Take 6 mg by mouth every morning.  06/05/16  Yes Historical Provider, MD    aspirin EC 81 MG tablet Take 1 tablet (81 mg total) by mouth daily. Patient not taking: Reported on 10/01/2016 08/01/16   Arbutus Leas, NP    Physical Exam: Vitals:   10/02/16 0230 10/02/16 0245 10/02/16 0300 10/02/16 0315  BP: 111/65 126/61 116/69 115/71  Pulse: (!) 53 (!) 59 70 62  Resp: '19 24 20 18  ' Temp:      TempSrc:      SpO2: 90% 96% 90% 93%  Weight:      Height:          Constitutional: Moderately built and nourished. Vitals:   10/02/16 0230 10/02/16 0245 10/02/16 0300 10/02/16 0315  BP: 111/65 126/61 116/69 115/71  Pulse: (!) 53 (!) 59 70 62  Resp: '19 24 20 18  ' Temp:      TempSrc:      SpO2: 90% 96% 90% 93%  Weight:      Height:       Eyes: Anicteric. No pallor. ENMT: No discharge from the ears eyes nose and mouth. Neck: No mass felt. No JVD appreciated. Respiratory: No rhonchi or crepitations. Cardiovascular: S1 and S2 heard. No murmurs appreciated. Abdomen: Soft nontender bowel sounds present. No guarding or rigidity. Musculoskeletal: No edema. No joint effusion. Skin: No rash. Skin appears warm. Neurologic: Alert awake oriented to time place and person. Moves all extremities. Psychiatric: Appears normal. Normal affect.   Labs on Admission: I have personally reviewed following labs and imaging studies  CBC:  Recent Labs Lab 10/01/16 1729  WBC 4.1  HGB 8.4*  HCT 27.4*  MCV 101.1*  PLT 494*   Basic Metabolic Panel:  Recent Labs Lab 10/01/16 1729 10/02/16 0326  NA 146* 143  K 3.7 3.1*  CL 105 104  CO2 27 25  GLUCOSE 100* 93  BUN 44* 46*  CREATININE 2.25* 2.07*  CALCIUM >13.0* 14.4*  PHOS  --  4.6   GFR: Estimated Creatinine Clearance: 43.4 mL/min (by C-G formula based on SCr of 2.07 mg/dL (H)). Liver Function Tests: No results for input(s): AST, ALT, ALKPHOS, BILITOT, PROT, ALBUMIN in the last 168 hours. No results for input(s): LIPASE, AMYLASE in the last 168 hours. No results for input(s): AMMONIA in the last 168  hours. Coagulation Profile:  Recent Labs Lab 10/01/16 1729  INR 3.03   Cardiac Enzymes: No results for input(s): CKTOTAL, CKMB, CKMBINDEX, TROPONINI in the last 168 hours. BNP (last 3 results) No results for input(s): PROBNP in the last 8760 hours. HbA1C: No results for input(s): HGBA1C in the last 72 hours. CBG: No results for input(s): GLUCAP in the last 168 hours. Lipid Profile: No results for input(s): CHOL, HDL, LDLCALC, TRIG, CHOLHDL, LDLDIRECT in the last 72 hours. Thyroid Function Tests: No results for input(s): TSH, T4TOTAL, FREET4, T3FREE, THYROIDAB in the last 72 hours. Anemia Panel: No results for input(s): VITAMINB12, FOLATE, FERRITIN, TIBC, IRON, RETICCTPCT in the last  72 hours. Urine analysis:    Component Value Date/Time   COLORURINE YELLOW 10/01/2016 2353   APPEARANCEUR CLEAR 10/01/2016 2353   LABSPEC 1.012 10/01/2016 2353   PHURINE 5.0 10/01/2016 2353   GLUCOSEU NEGATIVE 10/01/2016 2353   HGBUR SMALL (A) 10/01/2016 2353   BILIRUBINUR NEGATIVE 10/01/2016 2353   KETONESUR NEGATIVE 10/01/2016 2353   PROTEINUR NEGATIVE 10/01/2016 2353   UROBILINOGEN 0.2 11/03/2010 1649   NITRITE NEGATIVE 10/01/2016 2353   LEUKOCYTESUR NEGATIVE 10/01/2016 2353   Sepsis Labs: '@LABRCNTIP' (procalcitonin:4,lacticidven:4) )No results found for this or any previous visit (from the past 240 hour(s)).   Radiological Exams on Admission: Ct Abdomen Pelvis Wo Contrast  Result Date: 10/02/2016 CLINICAL DATA:  70 year old male with abdominal pain and back pain x5 weeks. EXAM: CT ABDOMEN AND PELVIS WITHOUT CONTRAST TECHNIQUE: Multidetector CT imaging of the abdomen and pelvis was performed following the standard protocol without IV contrast. COMPARISON:  Abdominal CT dated 11/03/2010 FINDINGS: Evaluation of this exam is limited in the absence of intravenous contrast. Lower chest: Partially visualized moderate right pleural effusion with associated partial compressive atelectasis of the  right lower lobe. Superimposed pneumonia is not excluded. Clinical correlation is recommended. The pleural effusion is new since the study of 20/12 2012. There is coronary vascular calcification. Top-normal cardiac size. There is hypoattenuation of the cardiac blood pool suggestive of a degree of anemia. Clinical correlation is recommended. No intra-abdominal free air. There is diffuse mesenteric stranding and small ascites. Hepatobiliary: Cirrhosis. There is no intrahepatic biliary ductal dilatation. Stones noted within the gallbladder. The gallbladder is predominantly contracted. Mild haziness of the gallbladder wall likely related to cirrhosis and ascites. Ultrasound may provide better evaluation if there is clinical concern for acute cholecystitis. Pancreas: Unremarkable. No pancreatic ductal dilatation or surrounding inflammatory changes. Spleen: Normal in size without focal abnormality. Adrenals/Urinary Tract: The adrenal glands appear unremarkable. Multiple nonobstructing bilateral renal calculi measuring up to 4 mm. There is no hydronephrosis on either side. A 2 cm left renal inferior pole exophytic high attenuating lesions is incompletely characterized but most likely represents a complex/hemorrhagic cyst. Further evaluation with ultrasound is recommended. The visualized ureters and urinary bladder appear unremarkable. Stomach/Bowel: There is diverticulosis of the second portion of the duodenum at the head of the pancreas measuring up to 3.5 cm. No definite associated active inflammatory changes. Mild thickened appearance of the colonic folds may be related to underdistention or hepatic colopathy. Mild colitis is less likely but not entirely excluded. Correlation with clinical exam and stool cultures recommended. There is no evidence of bowel obstruction. Normal appendix. Vascular/Lymphatic: There is advanced aortoiliac atherosclerotic disease. There is no aneurysmal dilatation. The IVC is grossly  unremarkable. Evaluation of the vasculature is limited in the absence of intravenous contrast. No portal venous gas identified. There is no adenopathy. Reproductive: Mild enlargement of the prostate gland measuring up to 5.3 cm in transverse axial diameter. Other: Small fat containing right inguinal hernia. There is mild diffuse subcutaneous edema and anasarca. No fluid collection. Musculoskeletal: There is osteopenia with multilevel degenerative changes of the spine. Multilevel old-appearing compression deformity most prominent involving the L4 with approximately 30% loss of vertebral body height. No definite acute fracture. A 4.0 x 2.4 cm lytic lesion involving the right iliac bone demonstrates aggressive features suggest cortical breakthrough and is concerning for malignancy. Further evaluation with MRI is recommended. IMPRESSION: Cirrhosis with small ascites and mild anasarca. Cholelithiasis. Duodenal diverticulosis. Hepatic colopathy versus less likely mild colitis. Correlation with clinical exam and stool cultures recommended.  No bowel obstruction. Normal appendix. Moderate right pleural effusion with partial compressive atelectasis of the right lower lobe. Lytic lesion in the right iliac bone with associated cortical breakthrough. MRI is recommended for further evaluation. Nonobstructing bilateral renal calculi.  No hydronephrosis. Left renal inferior pole high attenuating lesion, incompletely characterized, likely a complex/hemorrhagic cyst. Ultrasound is recommended for further characterization. Electronically Signed   By: Anner Crete M.D.   On: 10/02/2016 01:30   Ct L-spine No Charge  Result Date: 10/02/2016 CLINICAL DATA:  70 year old male with back pain x5 weeks. EXAM: CT LUMBAR SPINE WITHOUT CONTRAST TECHNIQUE: Multidetector CT imaging of the lumbar spine was performed without intravenous contrast administration. Multiplanar CT image reconstructions were also generated. COMPARISON:  Abdominal  CT dated 10/02/2016 and 11/03/2010 FINDINGS: Segmentation: 5 lumbar type vertebrae. Alignment: Normal. Vertebrae: There is advanced osteopenia suspicious for diffuse lytic metastatic process or underlying marrow abnormality or myeloma. Correlation with clinical exam and further evaluation with MRI or bone scan is recommended. Multilevel age indeterminate compression deformity involving T10, T11, T12, L3 and L4 noted. There is approximately 25% loss of vertebral body height at L4. An acute fracture involving L4 is not excluded. Evaluation is very limited due to advanced osteopenia. Correlation with clinical exam and point tenderness and further evaluation with MRI if clinically indicated recommended. No retropulsed fragment identified. There is endplate irregularity and Schmorl node involving the superior endplate of the L5. A lytic lesion with cortical breakthrough noted in the right iliac bone. Paraspinal and other soft tissues: Bilateral renal stones, aortoiliac atherosclerotic disease, and small ascites as seen on the CT. The paraspinal soft tissues appear unremarkable. Partially visualized right pleural effusion. Disc levels: Multilevel disc disease with endplate irregularity. IMPRESSION: Advanced osseous demineralization may be related to diffuse lytic metastatic disease or myeloma. Correlation with clinical exam and further evaluation with MRI or bone scan recommended. Evaluation for acute fracture is very limited due to osteopenia. Multilevel age indeterminate compression deformity most prominent at L4. An acute fracture involving the L4 vertebra is not entirely excluded. Correlation with point tenderness recommended. No retropulsed fragment. Lytic lesion with aggressive features in the right iliac bone concerning for malignancy. Further evaluation with MRI or bone scan recommended. Partially visualized right pleural effusion. Electronically Signed   By: Anner Crete M.D.   On: 10/02/2016 03:41   Dg  Chest Port 1 View  Result Date: 10/02/2016 CLINICAL DATA:  70 y/o  M; shortness of breath. EXAM: PORTABLE CHEST 1 VIEW COMPARISON:  11/03/2010 chest radiograph. FINDINGS: Cardiomegaly appears increased in comparison with prior radiographs given projection and technique. Prominent interstitial markings. Right basilar opacity obscures the right heart border. No definite pleural effusion. Bones are unremarkable. IMPRESSION: Increased cardiomegaly from prior radiographs may represent heart failure or pericardial effusion. Increased interstitial markings and opacity partially obscuring the right heart border probably represents pulmonary edema. Pneumonia is not excluded. Electronically Signed   By: Kristine Garbe M.D.   On: 10/02/2016 00:21    EKG: Independently reviewed. A. fib with PVCs.  Assessment/Plan Principal Problem:   Hypercalcemia    1. Low back pain with lytic lesions in the CAT scan concerning for multiple myeloma - will need biopsy arranged from lytic lesions. Oncology consult following that. I have ordered SPEP and urine immunofixation test. 2. Hypercalcemia - probably secondary to possible multiple myeloma. I have discussed with pharmacy at this time and will dose pamidronate 60 mg given the patient's kidney disease. Patient will be continued on IV fluids and IV  Lasix. Closely follow respiratory status and follow metabolic panel. Hold vitamin D. Parathormone and parathormone -like peptide levels and phosphorus and vitamin D levels are pending. 3. Acute renal failure probably secondary to myeloma - continue to hydrate hold lisinopril. Follow metabolic panel intake output. 4. Shortness of breath with mildly elevated d-dimer - may be secondary to low back pain. VQ scan ordered for ruling out PE. Patient is on anticoagulations already. Cycle cardiac markers given the history of ischemic cardiomyopathy and recent stent. 5. Ischemic cardiomyopathy last EF measured was 25-35% - patient  is receiving fluids for hypercalcemia. Patient is also on IV Lasix. Closely follow respiratory status. 6. CAD status post stenting - on Plavix Coumadin statins and beta blocker. Since patient is short of breath will cycle cardiac markers. 7. Anemia and thrombocytopenia probably from possible multiple myeloma - follow CBC. 8. Chronic atrial fibrillation - continue Coreg. Since patient may require procedures I will place patient on heparin instead of Coumadin. 9. Diabetes mellitus type 2 - will place patient on sliding scale coverage and hold all oral hypoglycemics.   DVT prophylaxis: Heparin/Coumadin. Code Status: Full code.  Family Communication: Patient's wife.  Disposition Plan: To be determined.  Consults called: None.  Admission status: Inpatient.    Rise Patience MD [facility] Pager [phone].  If 7PM-7AM, please contact night-coverage [email or web address] Password [email or web address]  10/02/2016, 6:50 AM

## 2016-10-02 NOTE — Progress Notes (Deleted)
Initial Nutrition Assessment  DOCUMENTATION CODES:   Obesity unspecified  INTERVENTION:    Ensure Enlive po BID, each supplement provides 350 kcal and 20 grams of protein  NUTRITION DIAGNOSIS:   Increased nutrient needs related to chronic illness as evidenced by estimated needs  GOAL:   Patient will meet greater than or equal to 90% of their needs  MONITOR:   PO intake, Supplement acceptance, Labs, Weight trends, I & O's  REASON FOR ASSESSMENT:   Malnutrition Screening Tool  ASSESSMENT:   70 y.o. male with history of CAD status post stenting recently in October, atrial fibrillation, cardiomyopathy, diabetes mellitus has been experiencing low back pain over the last 6 weeks. Patient had followed up with his PCP yesterday and was referred to cardiologist because of shortness of breath and patient was eventually referred to the ER because of concerns for possible PE. Patient was complaining of increasing low back pain over the last 6 weeks with even at rest. Denies any incontinence of urine or bowels. Lab work in the ER show hypercalcemia with calcium more than 14.   CT scan of the lumbar spine and abdomen shows lytic lesions concerning for multiple myeloma.  RD spoke with patient's son at bedside. Reports pt's appetite has been poor recently given his SOB. PO intake 100% per flowsheet records. Son amenable to RD ordering supplements. CBG's 105.  Unable to complete Nutrition Focused Physical Exam at this time.  Diet Order:  Diet heart healthy/carb modified Room service appropriate? Yes; Fluid consistency: Thin; Fluid restriction: 1200 mL Fluid Diet NPO time specified Except for: Sips with Meds  Skin:  Reviewed, no issues  Last BM:  12/10  Height:   Ht Readings from Last 1 Encounters:  10/01/16 '5\' 10"'  (1.778 m)    Weight:   Wt Readings from Last 1 Encounters:  10/02/16 235 lb 3.2 oz (106.7 kg)    Ideal Body Weight:  75.4 kg  BMI:  Body mass index is 33.75  kg/m.  Estimated Nutritional Needs:   Kcal:  1900-2100  Protein:  90-100 gm  Fluid:  1.9-2.1 L  EDUCATION NEEDS:   No education needs identified at this time  Arthur Holms, RD, LDN Pager #: [phone] After-Hours Pager #: [phone]

## 2016-10-02 NOTE — ED Notes (Signed)
Critical Calcium greater than 13.

## 2016-10-02 NOTE — Progress Notes (Addendum)
PROGRESS NOTE    Richard Vang  FVC:[id number] DOB: 1946-09-09 DOA: 10/01/2016  PCP: Sandi Mariscal, MD   Brief Narrative:  Richard Vang is a 70 y.o. male with history of CAD status post stenting recently in October, atrial fibrillation, cardiomyopathy, diabetes mellitus has been experiencing low back pain over the last 6 weeks. Patient had followed up with his PCP yesterday and was referred to cardiologist because of shortness of breath and patient was eventually referred to the ER because of concerns for possible PE. Patient was complaining of increasing low back pain over the last 6 weeks with even at rest. Denies any incontinence of urine or bowels. Lab work in the ER show hypercalcemia with calcium more than 14. CT scan of the lumbar spine and abdomen shows lytic lesions concerning for multiple myeloma. Patient states he is getting short of breath mostly because of the low back pain. Denies any chest pain. EKG shows A. fib with PVCs.  Subjective: Back pain does not appear to be well controlled on current dose of narcotic esp when he moves. No other complaints.   Assessment & Plan:   Principal Problem: Hypercalcemia,  Low back pain with lytic lesions in the CAT scan concerning for multiple myeloma  - constellation of findings that point towards MM include anemia, ^Ca+ and bone lytic lesions - has been using Oxycodone at home but is not enough- increase to 10 mg today - f/u SPEP, UPEP- oncology consult  - Ca > 15- aggressively treating - Lasix, Pamidronate, Calcitonin s/c for 48 hrs  Active Problems:  AKI on CKD 3 - in setting of fluid overload- may be related to MM as above- follow with diuretics  Anemia - Hb 15.3 in 1/12- 11 on admission- now 8.4- follow - likely due to MM (if that is indeed the diagnosis) but will check anemia panel and stool occults as he is on blood thinners    Essential hypertension Coreg   CAD S/P percutaneous coronary angioplasty with drug eluting stent in  LAD  -- no longer on Aspirin at home- cont Plavix- stent was placed in Nov of last year  Acute on chronic sCHF- ischemic cardiomyopathy - EF ~25 % by LV Gram 10/17- no ECHO in system- will obtain ECHO to assess EF - mod L effusion, pedal edema and mild anasarca (on CT) - Coreg -  cont Lasix 40 mg BID IV today  - no ACE due to AKI- on Lisinopril at home  Hypokalemia - replacing- due to Lasix  Mildly elevated TN I - no chest pain- flat trend- due to AKI vs fluid overload  Mildly elevated d dimer -not hypoxic-  may be due to underlying cancer- already on Coumadin    Chronic atrial fibrillation  - CHA2DS2-VASc Score 4 - coreg, Coumadin    Thrombocytopenia  - mild, acute- follow heme/ onc to help assess  Cirrhosis  - h/o Hep C   DVT prophylaxis: Coumadin Code Status: Full code Family Communication: wife Disposition Plan: cont to follow on tele Consultants:    Procedures:    Antimicrobials:  Anti-infectives    None       Objective: Vitals:   10/02/16 0300 10/02/16 0315 10/02/16 0722 10/02/16 0741  BP: 116/69 115/71 129/69   Pulse: 70 62 64   Resp: '20 18 20   ' Temp:   97.9 F (36.6 C)   TempSrc:   Oral   SpO2: 90% 93% 99% 99%  Weight:   106.7 kg (235 lb  3.2 oz)   Height:        Intake/Output Summary (Last 24 hours) at 10/02/16 1317 Last data filed at 10/02/16 1030  Gross per 24 hour  Intake              240 ml  Output              300 ml  Net              -60 ml   Filed Weights   10/01/16 1717 10/02/16 0722  Weight: 117.9 kg (260 lb) 106.7 kg (235 lb 3.2 oz)    Examination: General exam: Appears comfortable  HEENT: PERRLA, oral mucosa moist, no sclera icterus or thrush Respiratory system: Clear to auscultation. Respiratory effort normal. Cardiovascular system: S1 & S2 heard, RRR.  No murmurs  Gastrointestinal system: Abdomen soft, non-tender, nondistended. Normal bowel sound. No organomegaly Central nervous system: Alert and oriented. No focal  neurological deficits. Extremities: No cyanosis, clubbing - + pedal edema Skin: No rashes or ulcers Psychiatry:  Mood & affect appropriate.     Data Reviewed: I have personally reviewed following labs and imaging studies  CBC:  Recent Labs Lab 10/01/16 1729  WBC 4.1  HGB 8.4*  HCT 27.4*  MCV 101.1*  PLT 938*   Basic Metabolic Panel:  Recent Labs Lab 10/01/16 1729 10/02/16 0326  NA 146* 143  K 3.7 3.1*  CL 105 104  CO2 27 25  GLUCOSE 100* 93  BUN 44* 46*  CREATININE 2.25* 2.07*  CALCIUM >15.0* 14.4*  PHOS  --  4.6   GFR: Estimated Creatinine Clearance: 41.2 mL/min (by C-G formula based on SCr of 2.07 mg/dL (H)). Liver Function Tests: No results for input(s): AST, ALT, ALKPHOS, BILITOT, PROT, ALBUMIN in the last 168 hours. No results for input(s): LIPASE, AMYLASE in the last 168 hours. No results for input(s): AMMONIA in the last 168 hours. Coagulation Profile:  Recent Labs Lab 10/01/16 1729 10/02/16 0808  INR 3.03 3.96   Cardiac Enzymes:  Recent Labs Lab 10/02/16 0754  TROPONINI 0.05*   BNP (last 3 results) No results for input(s): PROBNP in the last 8760 hours. HbA1C: No results for input(s): HGBA1C in the last 72 hours. CBG:  Recent Labs Lab 10/02/16 1111  GLUCAP 105*   Lipid Profile: No results for input(s): CHOL, HDL, LDLCALC, TRIG, CHOLHDL, LDLDIRECT in the last 72 hours. Thyroid Function Tests: No results for input(s): TSH, T4TOTAL, FREET4, T3FREE, THYROIDAB in the last 72 hours. Anemia Panel: No results for input(s): VITAMINB12, FOLATE, FERRITIN, TIBC, IRON, RETICCTPCT in the last 72 hours. Urine analysis:    Component Value Date/Time   COLORURINE YELLOW 10/01/2016 2353   APPEARANCEUR CLEAR 10/01/2016 2353   LABSPEC 1.012 10/01/2016 2353   PHURINE 5.0 10/01/2016 2353   GLUCOSEU NEGATIVE 10/01/2016 2353   HGBUR SMALL (A) 10/01/2016 2353   BILIRUBINUR NEGATIVE 10/01/2016 2353   KETONESUR NEGATIVE 10/01/2016 2353   PROTEINUR  NEGATIVE 10/01/2016 2353   UROBILINOGEN 0.2 11/03/2010 1649   NITRITE NEGATIVE 10/01/2016 2353   LEUKOCYTESUR NEGATIVE 10/01/2016 2353   Sepsis Labs: '@LABRCNTIP' (procalcitonin:4,lacticidven:4) )No results found for this or any previous visit (from the past 240 hour(s)).       Radiology Studies: Ct Abdomen Pelvis Wo Contrast  Result Date: 10/02/2016 CLINICAL DATA:  71 year old male with abdominal pain and back pain x5 weeks. EXAM: CT ABDOMEN AND PELVIS WITHOUT CONTRAST TECHNIQUE: Multidetector CT imaging of the abdomen and pelvis was performed following the standard protocol without  IV contrast. COMPARISON:  Abdominal CT dated 11/03/2010 FINDINGS: Evaluation of this exam is limited in the absence of intravenous contrast. Lower chest: Partially visualized moderate right pleural effusion with associated partial compressive atelectasis of the right lower lobe. Superimposed pneumonia is not excluded. Clinical correlation is recommended. The pleural effusion is new since the study of 20/12 2012. There is coronary vascular calcification. Top-normal cardiac size. There is hypoattenuation of the cardiac blood pool suggestive of a degree of anemia. Clinical correlation is recommended. No intra-abdominal free air. There is diffuse mesenteric stranding and small ascites. Hepatobiliary: Cirrhosis. There is no intrahepatic biliary ductal dilatation. Stones noted within the gallbladder. The gallbladder is predominantly contracted. Mild haziness of the gallbladder wall likely related to cirrhosis and ascites. Ultrasound may provide better evaluation if there is clinical concern for acute cholecystitis. Pancreas: Unremarkable. No pancreatic ductal dilatation or surrounding inflammatory changes. Spleen: Normal in size without focal abnormality. Adrenals/Urinary Tract: The adrenal glands appear unremarkable. Multiple nonobstructing bilateral renal calculi measuring up to 4 mm. There is no hydronephrosis on either side.  A 2 cm left renal inferior pole exophytic high attenuating lesions is incompletely characterized but most likely represents a complex/hemorrhagic cyst. Further evaluation with ultrasound is recommended. The visualized ureters and urinary bladder appear unremarkable. Stomach/Bowel: There is diverticulosis of the second portion of the duodenum at the head of the pancreas measuring up to 3.5 cm. No definite associated active inflammatory changes. Mild thickened appearance of the colonic folds may be related to underdistention or hepatic colopathy. Mild colitis is less likely but not entirely excluded. Correlation with clinical exam and stool cultures recommended. There is no evidence of bowel obstruction. Normal appendix. Vascular/Lymphatic: There is advanced aortoiliac atherosclerotic disease. There is no aneurysmal dilatation. The IVC is grossly unremarkable. Evaluation of the vasculature is limited in the absence of intravenous contrast. No portal venous gas identified. There is no adenopathy. Reproductive: Mild enlargement of the prostate gland measuring up to 5.3 cm in transverse axial diameter. Other: Small fat containing right inguinal hernia. There is mild diffuse subcutaneous edema and anasarca. No fluid collection. Musculoskeletal: There is osteopenia with multilevel degenerative changes of the spine. Multilevel old-appearing compression deformity most prominent involving the L4 with approximately 30% loss of vertebral body height. No definite acute fracture. A 4.0 x 2.4 cm lytic lesion involving the right iliac bone demonstrates aggressive features suggest cortical breakthrough and is concerning for malignancy. Further evaluation with MRI is recommended. IMPRESSION: Cirrhosis with small ascites and mild anasarca. Cholelithiasis. Duodenal diverticulosis. Hepatic colopathy versus less likely mild colitis. Correlation with clinical exam and stool cultures recommended. No bowel obstruction. Normal appendix.  Moderate right pleural effusion with partial compressive atelectasis of the right lower lobe. Lytic lesion in the right iliac bone with associated cortical breakthrough. MRI is recommended for further evaluation. Nonobstructing bilateral renal calculi.  No hydronephrosis. Left renal inferior pole high attenuating lesion, incompletely characterized, likely a complex/hemorrhagic cyst. Ultrasound is recommended for further characterization. Electronically Signed   By: Anner Crete M.D.   On: 10/02/2016 01:30   Ct L-spine No Charge  Result Date: 10/02/2016 CLINICAL DATA:  70 year old male with back pain x5 weeks. EXAM: CT LUMBAR SPINE WITHOUT CONTRAST TECHNIQUE: Multidetector CT imaging of the lumbar spine was performed without intravenous contrast administration. Multiplanar CT image reconstructions were also generated. COMPARISON:  Abdominal CT dated 10/02/2016 and 11/03/2010 FINDINGS: Segmentation: 5 lumbar type vertebrae. Alignment: Normal. Vertebrae: There is advanced osteopenia suspicious for diffuse lytic metastatic process or underlying marrow abnormality  or myeloma. Correlation with clinical exam and further evaluation with MRI or bone scan is recommended. Multilevel age indeterminate compression deformity involving T10, T11, T12, L3 and L4 noted. There is approximately 25% loss of vertebral body height at L4. An acute fracture involving L4 is not excluded. Evaluation is very limited due to advanced osteopenia. Correlation with clinical exam and point tenderness and further evaluation with MRI if clinically indicated recommended. No retropulsed fragment identified. There is endplate irregularity and Schmorl node involving the superior endplate of the L5. A lytic lesion with cortical breakthrough noted in the right iliac bone. Paraspinal and other soft tissues: Bilateral renal stones, aortoiliac atherosclerotic disease, and small ascites as seen on the CT. The paraspinal soft tissues appear  unremarkable. Partially visualized right pleural effusion. Disc levels: Multilevel disc disease with endplate irregularity. IMPRESSION: Advanced osseous demineralization may be related to diffuse lytic metastatic disease or myeloma. Correlation with clinical exam and further evaluation with MRI or bone scan recommended. Evaluation for acute fracture is very limited due to osteopenia. Multilevel age indeterminate compression deformity most prominent at L4. An acute fracture involving the L4 vertebra is not entirely excluded. Correlation with point tenderness recommended. No retropulsed fragment. Lytic lesion with aggressive features in the right iliac bone concerning for malignancy. Further evaluation with MRI or bone scan recommended. Partially visualized right pleural effusion. Electronically Signed   By: Anner Crete M.D.   On: 10/02/2016 03:41   Dg Chest Port 1 View  Result Date: 10/02/2016 CLINICAL DATA:  70 y/o  M; shortness of breath. EXAM: PORTABLE CHEST 1 VIEW COMPARISON:  11/03/2010 chest radiograph. FINDINGS: Cardiomegaly appears increased in comparison with prior radiographs given projection and technique. Prominent interstitial markings. Right basilar opacity obscures the right heart border. No definite pleural effusion. Bones are unremarkable. IMPRESSION: Increased cardiomegaly from prior radiographs may represent heart failure or pericardial effusion. Increased interstitial markings and opacity partially obscuring the right heart border probably represents pulmonary edema. Pneumonia is not excluded. Electronically Signed   By: Kristine Garbe M.D.   On: 10/02/2016 00:21      Scheduled Meds: . atorvastatin  40 mg Oral QHS  . calcitonin (salmon)  1 spray Alternating Nares Daily  . carvedilol  6.25 mg Oral BID WC  . clopidogrel  75 mg Oral Daily  . feeding supplement (ENSURE ENLIVE)  237 mL Oral BID BM  . furosemide  40 mg Intravenous BID  . insulin aspart  0-9 Units  Subcutaneous TID WC  . oxyCODONE  10 mg Oral Q6H  . pamidronate  60 mg Intravenous Once  . sodium chloride  250 mL Intravenous Once   Continuous Infusions:   LOS: 0 days    Time spent in minutes: [place], MD [facility] Pager: [email or web address] Password [email or web address] 10/02/2016, 1:17 PM

## 2016-10-02 NOTE — Progress Notes (Addendum)
Initial Nutrition Assessment  DOCUMENTATION CODES:   Obesity unspecified  INTERVENTION:    Ensure Enlive po BID, each supplement provides 350 kcal and 20 grams of protein  NUTRITION DIAGNOSIS:   Increased nutrient needs related to  (acute on chronic illness) as evidenced by estimated needs  GOAL:   Patient will meet greater than or equal to 90% of their needs  MONITOR:   PO intake, Supplement acceptance, Labs, Weight trends, I & O's  REASON FOR ASSESSMENT:   Malnutrition Screening Tool  ASSESSMENT:   70 y.o. male with history of CAD status post stenting recently in October, atrial fibrillation, cardiomyopathy, diabetes mellitus has been experiencing low back pain over the last 6 weeks. Patient had followed up with his PCP yesterday and was referred to cardiologist because of shortness of breath and patient was eventually referred to the ER because of concerns for possible PE. Patient was complaining of increasing low back pain over the last 6 weeks with even at rest. Denies any incontinence of urine or bowels. Lab work in the ER show hypercalcemia with calcium more than 14.   CT scan of the lumbar spine and abdomen shows lytic lesions concerning for multiple myeloma.  RD spoke with pt's son at bedside. Reports pt's appetite has been poor recently given his SOB. PO intake 100% per flowsheet records. Son amenable to RD ordering supplements. For bone barrow bx per IR tomorrow. CBG's 105.  Unable to complete Nutrition Focused Physical Exam at this time.  Diet Order:  Diet heart healthy/carb modified Room service appropriate? Yes; Fluid consistency: Thin; Fluid restriction: 1200 mL Fluid Diet NPO time specified Except for: Sips with Meds  Skin:  Reviewed, no issues  Last BM:  12/10  Height:   Ht Readings from Last 1 Encounters:  10/01/16 '5\' 10"'  (1.778 m)    Weight:   Wt Readings from Last 1 Encounters:  10/02/16 235 lb 3.2 oz (106.7 kg)    Ideal Body Weight:   75.4 kg  BMI:  Body mass index is 33.75 kg/m.  Estimated Nutritional Needs:   Kcal:  1900-2100  Protein:  90-100 gm  Fluid:  1.9-2.1 L  EDUCATION NEEDS:   No education needs identified at this time  Arthur Holms, RD, LDN Pager #: [phone] After-Hours Pager #: [phone]

## 2016-10-02 NOTE — Progress Notes (Addendum)
ADDENDUM Plan to transition from coumadin >> heparin for poss biopsy however, no longer proceeding with biopsy and to continue coumadin. INR this AM increased to 3.96 from 3.03 on admission. Hgb 8.4 down from 11.6 on 08/01/16. Plt 120.   Plan: -Hold coumadin tonight -Daily INR -F/U VQ scan -Monitor s/s bleeding ________________________________________________________________________________________________   ANTICOAGULATION CONSULT NOTE - Initial Consult Pharmacy Consult for coumadin Indication: afib, r/o PE  No Known Allergies  Patient Measurements: Height: 5\' 10"  (177.8 cm) Weight: 235 lb 3.2 oz (106.7 kg) IBW/kg (Calculated) : 73 Heparin Dosing Weight: 95.9kg  Vital Signs: Temp: 97.9 F (36.6 C) (12/14 0722) Temp Source: Oral (12/14 0722) BP: 129/69 (12/14 0722) Pulse Rate: 64 (12/14 0722)  Labs:  Recent Labs  10/01/16 1729 10/02/16 0326  HGB 8.4*  --   HCT 27.4*  --   PLT 120*  --   LABPROT 32.0*  --   INR 3.03  --   CREATININE 2.25* 2.07*    Estimated Creatinine Clearance: 41.2 mL/min (by C-G formula based on SCr of 2.07 mg/dL (H)).   Medical History: Past Medical History:  Diagnosis Date  . Arthritis    "left shoulder" (07/31/2016)  . Coronary artery disease   . GERD (gastroesophageal reflux disease)   . Heart murmur   . High cholesterol   . Hypertension   . Sleep apnea    "probably; having test in November" (07/31/2016)  . Type II diabetes mellitus (HCC)     Medications:  See EMR  Assessment: 70 YOM on coumadin PTA for afib - last dose 12/13. Referred to ED for concern of poss PE due to SOB however, pt states shortness of breath due to low back pain x6 weeks. INR on admission slightly supratherapeutic at 3.03. D-dimer mildly elevated at 1.85. VQ scan ordered for r/o PE. Hgb 8.4 down from 11.6 on 08/01/16. Plt 120.   PTA dose: 6mg  daily  Goal of Therapy:  Heparin level 0.3-0.7 units/ml Monitor platelets by anticoagulation protocol: Yes    Plan:  -INR today -Holding coumadin; transition to heparin when INR <2 in anticipation of procedures -F/U VQ scan -Monitor CBC, s/s bleeding  Stephens November, PharmD Clinical Pharmacist 8:25 AM, 10/02/2016

## 2016-10-02 NOTE — ED Notes (Addendum)
Pt has been resting comfortably for past couple hours.  Reports back does not hurt unless he moves.  Pulled pt up on bed.  Pt drinking second bottle contrast prior to CT.  Wife and son at bedside.

## 2016-10-03 ENCOUNTER — Inpatient Hospital Stay (HOSPITAL_COMMUNITY): Payer: Medicare HMO

## 2016-10-03 DIAGNOSIS — I2510 Atherosclerotic heart disease of native coronary artery without angina pectoris: Secondary | ICD-10-CM

## 2016-10-03 DIAGNOSIS — I10 Essential (primary) hypertension: Secondary | ICD-10-CM

## 2016-10-03 DIAGNOSIS — I369 Nonrheumatic tricuspid valve disorder, unspecified: Secondary | ICD-10-CM

## 2016-10-03 DIAGNOSIS — M899 Disorder of bone, unspecified: Secondary | ICD-10-CM

## 2016-10-03 DIAGNOSIS — M545 Low back pain: Secondary | ICD-10-CM

## 2016-10-03 DIAGNOSIS — Z9861 Coronary angioplasty status: Secondary | ICD-10-CM

## 2016-10-03 DIAGNOSIS — I5021 Acute systolic (congestive) heart failure: Secondary | ICD-10-CM

## 2016-10-03 DIAGNOSIS — D649 Anemia, unspecified: Secondary | ICD-10-CM

## 2016-10-03 LAB — GLUCOSE, CAPILLARY
GLUCOSE-CAPILLARY: 122 mg/dL — AB (ref 65–99)
Glucose-Capillary: 115 mg/dL — ABNORMAL HIGH (ref 65–99)
Glucose-Capillary: 122 mg/dL — ABNORMAL HIGH (ref 65–99)
Glucose-Capillary: 111 mg/dL — ABNORMAL HIGH (ref 65–99)

## 2016-10-03 LAB — ECHOCARDIOGRAM COMPLETE
Height: 70 in
WEIGHTICAEL: 3673.6 [oz_av]

## 2016-10-03 LAB — BASIC METABOLIC PANEL
Anion gap: 8 (ref 5–15)
BUN: 46 mg/dL — AB (ref 6–20)
CHLORIDE: 107 mmol/L (ref 101–111)
CO2: 29 mmol/L (ref 22–32)
CREATININE: 2.15 mg/dL — AB (ref 0.61–1.24)
Calcium: 13 mg/dL — ABNORMAL HIGH (ref 8.9–10.3)
GFR calc Af Amer: 34 mL/min — ABNORMAL LOW (ref >60)
GFR calc non Af Amer: 30 mL/min — ABNORMAL LOW (ref >60)
GLUCOSE: 120 mg/dL — AB (ref 65–99)
POTASSIUM: 3.5 mmol/L (ref 3.5–5.1)
Sodium: 144 mmol/L (ref 135–145)

## 2016-10-03 LAB — BONE MARROW EXAM

## 2016-10-03 LAB — PTH, INTACT AND CALCIUM
CALCIUM TOTAL (PTH): 14.4 mg/dL — AB (ref 8.6–10.2)
PTH: 7 pg/mL — ABNORMAL LOW (ref 15–65)

## 2016-10-03 LAB — CBC
HCT: 27.4 % — ABNORMAL LOW (ref 39.0–52.0)
HEMATOCRIT: 24.9 % — AB (ref 39.0–52.0)
Hemoglobin: 7.8 g/dL — ABNORMAL LOW (ref 13.0–17.0)
Hemoglobin: 8.3 g/dL — ABNORMAL LOW (ref 13.0–17.0)
MCH: 31.6 pg (ref 26.0–34.0)
MCH: 30.6 pg (ref 26.0–34.0)
MCHC: 31.3 g/dL (ref 30.0–36.0)
MCHC: 30.3 g/dL (ref 30.0–36.0)
MCV: 100.8 fL — AB (ref 78.0–100.0)
MCV: 101.1 fL — ABNORMAL HIGH (ref 78.0–100.0)
PLATELETS: 94 10*3/uL — AB (ref 150–400)
PLATELETS: 106 10*3/uL — AB (ref 150–400)
RBC: 2.47 MIL/uL — ABNORMAL LOW (ref 4.22–5.81)
RBC: 2.71 MIL/uL — AB (ref 4.22–5.81)
RDW: 18.1 % — AB (ref 11.5–15.5)
RDW: 18.4 % — AB (ref 11.5–15.5)
WBC: 3.5 10*3/uL — ABNORMAL LOW (ref 4.0–10.5)
WBC: 4.4 10*3/uL (ref 4.0–10.5)

## 2016-10-03 LAB — KAPPA/LAMBDA LIGHT CHAINS
KAPPA FREE LGHT CHN: 700 mg/L — AB (ref 3.3–19.4)
Kappa, lambda light chain ratio: 94.59 — ABNORMAL HIGH (ref 0.26–1.65)
Lambda free light chains: 7.4 mg/L (ref 5.7–26.3)

## 2016-10-03 LAB — BETA 2 MICROGLOBULIN, SERUM: BETA 2 MICROGLOBULIN: 7.4 mg/L — AB (ref 0.6–2.4)

## 2016-10-03 LAB — PROTIME-INR
INR: 4.8
Prothrombin Time: 46.3 seconds — ABNORMAL HIGH (ref 11.4–15.2)

## 2016-10-03 LAB — VITAMIN B12: VITAMIN B 12: 219 pg/mL (ref 180–914)

## 2016-10-03 LAB — MAGNESIUM: Magnesium: 2.5 mg/dL — ABNORMAL HIGH (ref 1.7–2.4)

## 2016-10-03 LAB — PREPARE RBC (CROSSMATCH)

## 2016-10-03 LAB — FOLATE: Folate: 7.6 ng/mL (ref >5.9)

## 2016-10-03 LAB — HEMOGLOBIN AND HEMATOCRIT, BLOOD
HCT: 28.1 % — ABNORMAL LOW (ref 39.0–52.0)
Hemoglobin: 8.6 g/dL — ABNORMAL LOW (ref 13.0–17.0)

## 2016-10-03 LAB — IMMUNOFIXATION, URINE

## 2016-10-03 MED ORDER — MIDAZOLAM HCL 2 MG/2ML IJ SOLN
INTRAMUSCULAR | Status: DC | PRN
  Administered 2016-10-03: 0.5 mg via INTRAVENOUS
  Administered 2016-10-03: 1 mg via INTRAVENOUS

## 2016-10-03 MED ORDER — BISACODYL 5 MG PO TBEC: 5.0000 mg | DELAYED_RELEASE_TABLET | Freq: Every day | ORAL | Status: DC | PRN

## 2016-10-03 MED ORDER — FENTANYL CITRATE (PF) 100 MCG/2ML IJ SOLN
INTRAMUSCULAR | Status: AC
  Filled 2016-10-03: qty 4

## 2016-10-03 MED ORDER — SODIUM CHLORIDE 0.9 % IV SOLN
INTRAVENOUS | Status: DC | PRN
  Administered 2016-10-03: 30 mL/h via INTRAVENOUS

## 2016-10-03 MED ORDER — FENTANYL CITRATE (PF) 100 MCG/2ML IJ SOLN
INTRAMUSCULAR | Status: DC | PRN
  Administered 2016-10-03: 50 ug via INTRAVENOUS

## 2016-10-03 MED ORDER — SODIUM CHLORIDE 0.9 % IV SOLN
Freq: Once | INTRAVENOUS | Status: AC
  Administered 2016-10-03: 13:00:00 via INTRAVENOUS

## 2016-10-03 MED ORDER — LIDOCAINE HCL (PF) 1 % IJ SOLN
INTRAMUSCULAR | Status: AC
  Filled 2016-10-03: qty 30

## 2016-10-03 MED ORDER — CYANOCOBALAMIN 1000 MCG/ML IJ SOLN
1000.0000 ug | Freq: Once | INTRAMUSCULAR | Status: AC
  Administered 2016-10-03: 1000 ug via INTRAMUSCULAR
  Filled 2016-10-03: qty 1

## 2016-10-03 MED ORDER — POLYETHYLENE GLYCOL 3350 17 G PO PACK
17.0000 g | PACK | Freq: Every day | ORAL | Status: DC
  Administered 2016-10-03 – 2016-10-08 (×6): 17 g via ORAL
  Filled 2016-10-03 (×6): qty 1

## 2016-10-03 MED ORDER — MIDAZOLAM HCL 2 MG/2ML IJ SOLN
INTRAMUSCULAR | Status: AC
  Filled 2016-10-03: qty 4

## 2016-10-03 NOTE — Procedures (Signed)
Interventional Radiology Procedure Note  Procedure: CT guided aspirate and core biopsy of right anterior iliac bone Complications: None  Findings:  During repositioning prone position, patient experienced acute new pain on left chest wall.  Reproducible to palpation.  Concern for possible rib fracture.   Recommendations: - Bedrest supine x 3 hrs - OTC's PRN  Pain - Follow biopsy results - CXR now to evaluate for possible new rib fracture.   Signed,  Dulcy Fanny. Earleen Newport, DO

## 2016-10-03 NOTE — Progress Notes (Signed)
ANTICOAGULATION CONSULT NOTE - Follow Up Consult  Pharmacy Consult for Coumadin Indication: atrial fibrillation  No Known Allergies  Patient Measurements: Height: 5\' 10"  (177.8 cm) Weight: 229 lb 9.6 oz (104.1 kg) IBW/kg (Calculated) : 73  Vital Signs: Temp: 98 F (36.7 C) (12/15 0331) Temp Source: Oral (12/15 0331) BP: 112/65 (12/15 1044) Pulse Rate: 60 (12/15 1044)  Labs:  Recent Labs  10/01/16 1729 10/02/16 0326 10/02/16 0754 10/02/16 0808 10/02/16 1321 10/02/16 1640 10/02/16 1816 10/03/16 0156 10/03/16 0755  HGB 8.4*  --   --   --   --   --   --  7.8* 8.3*  HCT 27.4*  --   --   --   --   --   --  24.9* 27.4*  PLT 120*  --   --   --   --   --   --  94* 106*  LABPROT 32.0*  --   --  39.7*  --   --   --  46.3*  --   INR 3.03  --   --  3.96  --   --   --  4.80*  --   CREATININE 2.25* 2.07*  --   --   --  2.07*  --  2.15*  --   TROPONINI  --   --  0.05*  --  0.06*  --  0.06*  --   --     Estimated Creatinine Clearance: 39.2 mL/min (by C-G formula based on SCr of 2.15 mg/dL (H)).  Assessment: 70yom on coumadin pta for afib - last dose 12/13. C/O lower back pain x 6 weeks, CT spine showed multiple lytic lesions concerning for metastatic disease. Original plan was to transition from coumadin >> heparin for possible biopsy of the lesions however, IR did not think he needed a biopsy right away so heparin consult d/c'ed and coumadin consult ordered.  INR elevated so he did not receive any coumadin 12/14.  Oncology then was consulted and recommended for biopsy to be done. Biopsy was done today despite INR 4.8, no reversal agents given.  PTA dose: 6mg  daily  Goal of Therapy:  INR 2-3 Monitor platelets by anticoagulation protocol: Yes   Plan:  1) Hold coumadin tonight 2) Daily INR  Deboraha Sprang 10/03/2016,11:12 AM

## 2016-10-03 NOTE — Progress Notes (Signed)
PROGRESS NOTE    Richard Vang  YYT:[id number] DOB: May 19, 1946 DOA: 10/01/2016  PCP: Sandi Mariscal, MD   Brief Narrative:  Richard Vang is a 70 y.o. male with history of CAD status post stenting recently in October, atrial fibrillation, cardiomyopathy, diabetes mellitus has been experiencing low back pain over the last 6 weeks. Patient had followed up with his PCP yesterday and was referred to cardiologist because of shortness of breath and patient was eventually referred to the ER because of concerns for possible PE. Patient was complaining of increasing low back pain over the last 6 weeks with even at rest. Denies any incontinence of urine or bowels. Lab work in the ER show hypercalcemia with calcium more than 14. CT scan of the lumbar spine and abdomen shows lytic lesions concerning for multiple myeloma. Patient states he is getting short of breath mostly because of the low back pain. Denies any chest pain. EKG shows A. fib with PVCs.  Subjective: According to patient, back pain is better controlled today. No new complaints.    Assessment & Plan:   Principal Problem: Hypercalcemia,  Low back pain with lytic lesions in the CT scan concerning for multiple myeloma  - constellation of findings that point towards MM include anemia, ^Ca+ and bone lytic lesions - has been using Oxycodone at home but is not enough- increased to 10 mg - which is helping- laxatives ordered -  oncology consult - further work up is pending - underwent bone marrow biopsy today - Ca > 15- aggressively treating - Lasix, Pamidronate, Calcitonin s/c for 48 hrs - down to 13 today  Active Problems:  AKI on CKD 3 - in setting of fluid overload- may be related to MM as above- following    Anemia - Hb 15.3 in 1/12- 11 on admission- now 8.4- follow - likely due to MM (if that is indeed the diagnosis)  - anemia panel results to be reviewed by hematology Iron/TIBC/Ferritin/ %Sat    Component Value Date/Time   IRON 59  10/02/2016 1640   TIBC 479 (H) 10/02/2016 1640   FERRITIN 101 10/02/2016 1640   IRONPCTSAT 12 (L) 10/02/2016 1640   - Vit B12 pending - checking stool occults as he is on blood thinners - pending    Essential hypertension Coreg   CAD S/P percutaneous coronary angioplasty with drug eluting stent in LAD  -- no longer on Aspirin at home- cont Plavix- stent was placed in Nov of last year  Acute on chronic sCHF- ischemic cardiomyopathy - EF ~25 % by LV Gram 10/17- no ECHO in system- will obtain ECHO to assess EF - mod L effusion, pedal edema and mild anasarca (on CT) - Coreg - awaiting repeat CXR to re-eval - will then decide on Lasix dosing today  - no ACE at this time due to AKI- on Lisinopril at home  Hypokalemia - replacing- due to Lasix  Mildly elevated TN I - no chest pain- flat trend- due to AKI vs fluid overload  Mildly elevated d dimer -not hypoxic-  may be due to underlying cancer- already on Coumadin    Chronic atrial fibrillation  - CHA2DS2-VASc Score 4 - coreg, Coumadin- rate controlled    Thrombocytopenia  - mild, acute-likely related to above blood pathology-  heme/ onc to help assess  Cirrhosis  - h/o Hep C  DM2 - holding Metformin- SSI for now   DVT prophylaxis: Coumadin Code Status: Full code Family Communication: wife Disposition Plan: cont to follow  on tele Consultants:    Procedures:    Antimicrobials:  Anti-infectives    None       Objective: Vitals:   10/03/16 1019 10/03/16 1025 10/03/16 1032 10/03/16 1044  BP: 135/66 125/63 116/64 112/65  Pulse: 65 66 66 60  Resp: _0 Temp:      TempSrc:      SpO2: 97% 96% 94% 91%  Weight:      Height:        Intake/Output Summary (Last 24 hours) at 10/03/16 1116 Last data filed at 10/03/16 0800  Gross per 24 hour  Intake              480 ml  Output             3000 ml  Net            -2520 ml   Filed Weights   10/01/16 1717 10/02/16 0722 10/03/16 0331  Weight: 117.9 kg  (260 lb) 106.7 kg (235 lb 3.2 oz) 104.1 kg (229 lb 9.6 oz)    Examination: General exam: Appears comfortable  HEENT: PERRLA, oral mucosa moist, no sclera icterus or thrush Respiratory system: Clear to auscultation. Respiratory effort normal. Cardiovascular system: S1 & S2 heard, RRR.  No murmurs  Gastrointestinal system: Abdomen soft, non-tender, nondistended. Normal bowel sound. No organomegaly Central nervous system: Alert and oriented. No focal neurological deficits. Extremities: No cyanosis, clubbing -  pedal edema improving Skin: No rashes or ulcers Psychiatry:  Mood & affect appropriate.     Data Reviewed: I have personally reviewed following labs and imaging studies  CBC:  Recent Labs Lab 10/01/16 1729 10/03/16 0156 10/03/16 0755  WBC 4.1 3.5* 4.4  HGB 8.4* 7.8* 8.3*  HCT 27.4* 24.9* 27.4*  MCV 101.1* 100.8* 101.1*  PLT 120* 94* 096*   Basic Metabolic Panel:  Recent Labs Lab 10/01/16 1729 10/02/16 0326 10/02/16 1640 10/03/16 0156  NA 146* 143 147* 144  K 3.7 3.1* 3.2* 3.5  CL 105 104 108 107  CO2 _1 GLUCOSE 100* 93 95 120*  BUN 44* 46* 45* 46*  CREATININE 2.25* 2.07* 2.07* 2.15*  CALCIUM >15.0* 14.4*  14.4* 14.9* 13.0*  MG  --   --   --  2.5*  PHOS  --  4.6  --   --    GFR: Estimated Creatinine Clearance: 39.2 mL/min (by C-G formula based on SCr of 2.15 mg/dL (H)). Liver Function Tests:  Recent Labs Lab 10/02/16 1640  AST 35  ALT 17  ALKPHOS 110  BILITOT 1.3*  PROT 5.9*  ALBUMIN 4.1   No results for input(s): LIPASE, AMYLASE in the last 168 hours. No results for input(s): AMMONIA in the last 168 hours. Coagulation Profile:  Recent Labs Lab 10/01/16 1729 10/02/16 0808 10/03/16 0156  INR 3.03 3.96 4.80*   Cardiac Enzymes:  Recent Labs Lab 10/02/16 0754 10/02/16 1321 10/02/16 1816  TROPONINI 0.05* 0.06* 0.06*   BNP (last 3 results) No results for input(s): PROBNP in the last 8760 hours. HbA1C: No results for  input(s): HGBA1C in the last 72 hours. CBG:  Recent Labs Lab 10/02/16 1111 10/02/16 1651 10/02/16 2200 10/03/16 0653  GLUCAP 105* 114* 135* 115*   Lipid Profile: No results for input(s): CHOL, HDL, LDLCALC, TRIG, CHOLHDL, LDLDIRECT in the last 72 hours. Thyroid Function Tests: No results for input(s): TSH, T4TOTAL, FREET4, T3FREE, THYROIDAB in the last 72 hours. Anemia Panel:  Recent Labs  10/02/16  1507 10/02/16 1640 10/03/16 0156  FOLATE  --   --  7.6  FERRITIN  --  101  --   TIBC  --  479*  --   IRON  --  59  --   RETICCTPCT 1.5  --   --    Urine analysis:    Component Value Date/Time   COLORURINE YELLOW 10/01/2016 [place] 10/01/2016 2353   LABSPEC 1.012 10/01/2016 2353   PHURINE 5.0 10/01/2016 2353   GLUCOSEU NEGATIVE 10/01/2016 2353   HGBUR SMALL (A) 10/01/2016 2353   BILIRUBINUR NEGATIVE 10/01/2016 2353   KETONESUR NEGATIVE 10/01/2016 2353   PROTEINUR NEGATIVE 10/01/2016 2353   UROBILINOGEN 0.2 11/03/2010 1649   NITRITE NEGATIVE 10/01/2016 2353   LEUKOCYTESUR NEGATIVE 10/01/2016 2353   Sepsis Labs: _0 (procalcitonin:4,lacticidven:4) )No results found for this or any previous visit (from the past 240 hour(s)).       Radiology Studies: Ct Abdomen Pelvis Wo Contrast  Result Date: 10/02/2016 CLINICAL DATA:  70 year old male with abdominal pain and back pain x5 weeks. EXAM: CT ABDOMEN AND PELVIS WITHOUT CONTRAST TECHNIQUE: Multidetector CT imaging of the abdomen and pelvis was performed following the standard protocol without IV contrast. COMPARISON:  Abdominal CT dated 11/03/2010 FINDINGS: Evaluation of this exam is limited in the absence of intravenous contrast. Lower chest: Partially visualized moderate right pleural effusion with associated partial compressive atelectasis of the right lower lobe. Superimposed pneumonia is not excluded. Clinical correlation is recommended. The pleural effusion is new since the study of 20/12 2012.  There is coronary vascular calcification. Top-normal cardiac size. There is hypoattenuation of the cardiac blood pool suggestive of a degree of anemia. Clinical correlation is recommended. No intra-abdominal free air. There is diffuse mesenteric stranding and small ascites. Hepatobiliary: Cirrhosis. There is no intrahepatic biliary ductal dilatation. Stones noted within the gallbladder. The gallbladder is predominantly contracted. Mild haziness of the gallbladder wall likely related to cirrhosis and ascites. Ultrasound may provide better evaluation if there is clinical concern for acute cholecystitis. Pancreas: Unremarkable. No pancreatic ductal dilatation or surrounding inflammatory changes. Spleen: Normal in size without focal abnormality. Adrenals/Urinary Tract: The adrenal glands appear unremarkable. Multiple nonobstructing bilateral renal calculi measuring up to 4 mm. There is no hydronephrosis on either side. A 2 cm left renal inferior pole exophytic high attenuating lesions is incompletely characterized but most likely represents a complex/hemorrhagic cyst. Further evaluation with ultrasound is recommended. The visualized ureters and urinary bladder appear unremarkable. Stomach/Bowel: There is diverticulosis of the second portion of the duodenum at the head of the pancreas measuring up to 3.5 cm. No definite associated active inflammatory changes. Mild thickened appearance of the colonic folds may be related to underdistention or hepatic colopathy. Mild colitis is less likely but not entirely excluded. Correlation with clinical exam and stool cultures recommended. There is no evidence of bowel obstruction. Normal appendix. Vascular/Lymphatic: There is advanced aortoiliac atherosclerotic disease. There is no aneurysmal dilatation. The IVC is grossly unremarkable. Evaluation of the vasculature is limited in the absence of intravenous contrast. No portal venous gas identified. There is no adenopathy.  Reproductive: Mild enlargement of the prostate gland measuring up to 5.3 cm in transverse axial diameter. Other: Small fat containing right inguinal hernia. There is mild diffuse subcutaneous edema and anasarca. No fluid collection. Musculoskeletal: There is osteopenia with multilevel degenerative changes of the spine. Multilevel old-appearing compression deformity most prominent involving the L4 with approximately 30% loss of vertebral body height. No definite acute fracture. A 4.0 x  2.4 cm lytic lesion involving the right iliac bone demonstrates aggressive features suggest cortical breakthrough and is concerning for malignancy. Further evaluation with MRI is recommended. IMPRESSION: Cirrhosis with small ascites and mild anasarca. Cholelithiasis. Duodenal diverticulosis. Hepatic colopathy versus less likely mild colitis. Correlation with clinical exam and stool cultures recommended. No bowel obstruction. Normal appendix. Moderate right pleural effusion with partial compressive atelectasis of the right lower lobe. Lytic lesion in the right iliac bone with associated cortical breakthrough. MRI is recommended for further evaluation. Nonobstructing bilateral renal calculi.  No hydronephrosis. Left renal inferior pole high attenuating lesion, incompletely characterized, likely a complex/hemorrhagic cyst. Ultrasound is recommended for further characterization. Electronically Signed   By: Anner Crete M.D.   On: 10/02/2016 01:30   Ct L-spine No Charge  Result Date: 10/02/2016 CLINICAL DATA:  70 year old male with back pain x5 weeks. EXAM: CT LUMBAR SPINE WITHOUT CONTRAST TECHNIQUE: Multidetector CT imaging of the lumbar spine was performed without intravenous contrast administration. Multiplanar CT image reconstructions were also generated. COMPARISON:  Abdominal CT dated 10/02/2016 and 11/03/2010 FINDINGS: Segmentation: 5 lumbar type vertebrae. Alignment: Normal. Vertebrae: There is advanced osteopenia  suspicious for diffuse lytic metastatic process or underlying marrow abnormality or myeloma. Correlation with clinical exam and further evaluation with MRI or bone scan is recommended. Multilevel age indeterminate compression deformity involving T10, T11, T12, L3 and L4 noted. There is approximately 25% loss of vertebral body height at L4. An acute fracture involving L4 is not excluded. Evaluation is very limited due to advanced osteopenia. Correlation with clinical exam and point tenderness and further evaluation with MRI if clinically indicated recommended. No retropulsed fragment identified. There is endplate irregularity and Schmorl node involving the superior endplate of the L5. A lytic lesion with cortical breakthrough noted in the right iliac bone. Paraspinal and other soft tissues: Bilateral renal stones, aortoiliac atherosclerotic disease, and small ascites as seen on the CT. The paraspinal soft tissues appear unremarkable. Partially visualized right pleural effusion. Disc levels: Multilevel disc disease with endplate irregularity. IMPRESSION: Advanced osseous demineralization may be related to diffuse lytic metastatic disease or myeloma. Correlation with clinical exam and further evaluation with MRI or bone scan recommended. Evaluation for acute fracture is very limited due to osteopenia. Multilevel age indeterminate compression deformity most prominent at L4. An acute fracture involving the L4 vertebra is not entirely excluded. Correlation with point tenderness recommended. No retropulsed fragment. Lytic lesion with aggressive features in the right iliac bone concerning for malignancy. Further evaluation with MRI or bone scan recommended. Partially visualized right pleural effusion. Electronically Signed   By: Anner Crete M.D.   On: 10/02/2016 03:41   Dg Chest Port 1 View  Result Date: 10/02/2016 CLINICAL DATA:  70 y/o  M; shortness of breath. EXAM: PORTABLE CHEST 1 VIEW COMPARISON:  11/03/2010  chest radiograph. FINDINGS: Cardiomegaly appears increased in comparison with prior radiographs given projection and technique. Prominent interstitial markings. Right basilar opacity obscures the right heart border. No definite pleural effusion. Bones are unremarkable. IMPRESSION: Increased cardiomegaly from prior radiographs may represent heart failure or pericardial effusion. Increased interstitial markings and opacity partially obscuring the right heart border probably represents pulmonary edema. Pneumonia is not excluded. Electronically Signed   By: Kristine Garbe M.D.   On: 10/02/2016 00:21   Dg Bone Survey Met  Result Date: 10/02/2016 CLINICAL DATA:  Low back pain for months, metastatic bone survey. Followup lytic lesion RIGHT iliac bone. Pending bone marrow biopsy for suspected multiple myeloma. EXAM: METASTATIC BONE SURVEY COMPARISON:  CT abdomen and pelvis/lumbar spine October 02, 2016 and CT chest November 03, 2010 FINDINGS: Skull:  No destructive bony lesions. RIGHT upper extremity: Small lytic lesion RIGHT acromion, not included on on prior chest CT. Widened scapholunate interval most compatible with old ligamentous injury associated with severe radiocarpal osteoarthrosis. Tiny lucency in the triquetrum bone. Moderate vascular calcifications. LEFT upper extremity: Surgical screw proximal LEFT humerus. No destructive bony lesions. Moderate vascular calcifications. Cervical spine: Advanced degenerative change without destructive bony lesions. Symmetric vascular calcifications in the neck. Thoracic spine: Moderate chronic lower thoracic compression fractures. No destructive bony lesions. Lumbar spine: Old L4 mild to moderate compression fracture, mild L3 old compression fracture. No destructive bony lesions. Pelvis: No acute osseous process. Lytic lesion RIGHT iliac bone corresponding to today's CT finding. RIGHT lower extremity: No destructive bony lesions or acute osseous process. LEFT  lower extremity: Small lytic lesion proximal LEFT femur diaphysis involving the intramedullary cavity and cortex. No acute osseous process. Chest: Cardiomegaly. Pulmonary vascular congestion. RIGHT pleural effusion better seen on today's CT abdomen and pelvis. Generalized osteopenia. IMPRESSION: Lytic lesion RIGHT iliac bone corresponding to today's CT finding. Additional small lytic lesions RIGHT acromion, LEFT femur, RIGHT carpal bones, concerning for metastatic disease in the setting of suspected neoplasm. Osteopenia. Electronically Signed   By: Elon Alas M.D.   On: 10/02/2016 22:01      Scheduled Meds: . sodium chloride   Intravenous Once  . atorvastatin  40 mg Oral QHS  . calcitonin  400 Units Subcutaneous Q12H  . carvedilol  6.25 mg Oral BID WC  . clopidogrel  75 mg Oral Daily  . feeding supplement (ENSURE ENLIVE)  237 mL Oral BID BM  . fentaNYL      . furosemide  40 mg Intravenous BID  . insulin aspart  0-9 Units Subcutaneous TID WC  . lidocaine (PF)      . lidocaine (PF)      . midazolam      . oxyCODONE  10 mg Oral Q6H  . polyethylene glycol  17 g Oral Daily  . sodium chloride  250 mL Intravenous Once   Continuous Infusions: . sodium chloride 30 mL/hr (10/03/16 0958)     LOS: 1 day    Time spent in minutes: [place], MD [facility] Pager: [email or web address] Password [email or web address] 10/03/2016, 11:16 AM

## 2016-10-03 NOTE — Sedation Documentation (Signed)
To xray now.

## 2016-10-03 NOTE — Sedation Documentation (Signed)
Patient is resting comfortably. 

## 2016-10-03 NOTE — Sedation Documentation (Signed)
Pt unable to lie on abdomen, due to discomfort, assisting to turn on side when pt stated ribs were hurting bilaterally,  Dr Earleen Newport here, moved back to bed to evaluate rib pain.

## 2016-10-03 NOTE — Progress Notes (Addendum)
CRITICAL VALUE ALERT  Critical value received:  INR 4.80  Date of notification:  10/03/16  Time of notification:  0352  Critical value read back: Yes  Nurse who received alert:  Bea Graff, RN  MD notified (1st page):  Baltazar Najjar  Time of first page:  0354  MD notified (2nd page):  Time of second page:  Responding MD: Baltazar Najjar  Time MD responded: 0400

## 2016-10-03 NOTE — Sedation Documentation (Signed)
o2 increased 4 l

## 2016-10-03 NOTE — Progress Notes (Signed)
Pt down for bone marrow biopsy via bed.

## 2016-10-03 NOTE — Sedation Documentation (Signed)
Pt now on back more comfortable, will evaluate rib pain with cxr after procedure.

## 2016-10-03 NOTE — Care Management Note (Signed)
Case Management Note  Patient Details  Name: Richard Vang MRN: [id number] Date of Birth: [birth year]  Subjective/Objective:       Pt admitted with lower back pain - now s/p marrow biobsy         Action/Plan:  Pt independent from home with wife.  Already has shower chair,  rollator, and commode extender in the home.  PT eval pending.  CM will continue to follow for discharge needs   Expected Discharge Date:  10/04/16               Expected Discharge Plan:     In-House Referral:     Discharge planning Services  CM Consult  Post Acute Care Choice:    Choice offered to:     DME Arranged:    DME Agency:     HH Arranged:    HH Agency:     Status of Service:  In process, will continue to follow  If discussed at Long Length of Stay Meetings, dates discussed:    Additional Comments:  Maryclare Labrador, RN 10/03/2016, 10:56 AM

## 2016-10-03 NOTE — Progress Notes (Signed)
Pt back from biopsy drowsy, responsive.  VSS. denies pain at this time.

## 2016-10-03 NOTE — Progress Notes (Signed)
PT Cancellation Note  Patient Details Name: Richard Vang MRN: [id number] DOB: 1946-06-28   Cancelled Treatment:    Reason Eval/Treat Not Completed: Patient at procedure or test/unavailable   [name] 10/03/2016, 11:06 AM  [name], [place]

## 2016-10-04 ENCOUNTER — Inpatient Hospital Stay (HOSPITAL_COMMUNITY): Payer: Medicare HMO

## 2016-10-04 DIAGNOSIS — I9589 Other hypotension: Secondary | ICD-10-CM

## 2016-10-04 DIAGNOSIS — R5081 Fever presenting with conditions classified elsewhere: Secondary | ICD-10-CM

## 2016-10-04 DIAGNOSIS — F05 Delirium due to known physiological condition: Secondary | ICD-10-CM

## 2016-10-04 LAB — BASIC METABOLIC PANEL
Anion gap: 12 (ref 5–15)
BUN: 48 mg/dL — AB (ref 6–20)
CALCIUM: 11.8 mg/dL — AB (ref 8.9–10.3)
CO2: 27 mmol/L (ref 22–32)
Chloride: 110 mmol/L (ref 101–111)
Creatinine, Ser: 2.32 mg/dL — ABNORMAL HIGH (ref 0.61–1.24)
GFR calc Af Amer: 31 mL/min — ABNORMAL LOW (ref >60)
GFR, EST NON AFRICAN AMERICAN: 27 mL/min — AB (ref >60)
GLUCOSE: 120 mg/dL — AB (ref 65–99)
Potassium: 3.3 mmol/L — ABNORMAL LOW (ref 3.5–5.1)
Sodium: 149 mmol/L — ABNORMAL HIGH (ref 135–145)

## 2016-10-04 LAB — URINALYSIS, ROUTINE W REFLEX MICROSCOPIC
Bilirubin Urine: NEGATIVE
Glucose, UA: NEGATIVE mg/dL
Ketones, ur: NEGATIVE mg/dL
Nitrite: NEGATIVE
Protein, ur: NEGATIVE mg/dL
Specific Gravity, Urine: 1.011 (ref 1.005–1.030)
pH: 6 (ref 5.0–8.0)

## 2016-10-04 LAB — CBC
HCT: 28.4 % — ABNORMAL LOW (ref 39.0–52.0)
HEMOGLOBIN: 8.7 g/dL — AB (ref 13.0–17.0)
MCH: 31 pg (ref 26.0–34.0)
MCHC: 30.6 g/dL (ref 30.0–36.0)
MCV: 101.1 fL — ABNORMAL HIGH (ref 78.0–100.0)
Platelets: 92 10*3/uL — ABNORMAL LOW (ref 150–400)
RBC: 2.81 MIL/uL — ABNORMAL LOW (ref 4.22–5.81)
RDW: 20.1 % — AB (ref 11.5–15.5)
WBC: 2.5 10*3/uL — AB (ref 4.0–10.5)

## 2016-10-04 LAB — LACTIC ACID, PLASMA: Lactic Acid, Venous: 1.9 mmol/L (ref 0.5–1.9)

## 2016-10-04 LAB — PROTIME-INR
INR: 4.71 — AB
PROTHROMBIN TIME: 45.6 s — AB (ref 11.4–15.2)

## 2016-10-04 LAB — GLUCOSE, CAPILLARY
GLUCOSE-CAPILLARY: 121 mg/dL — AB (ref 65–99)
GLUCOSE-CAPILLARY: 132 mg/dL — AB (ref 65–99)
Glucose-Capillary: 129 mg/dL — ABNORMAL HIGH (ref 65–99)
Glucose-Capillary: 122 mg/dL — ABNORMAL HIGH (ref 65–99)
Glucose-Capillary: 125 mg/dL — ABNORMAL HIGH (ref 65–99)

## 2016-10-04 LAB — TYPE AND SCREEN
ABO/RH(D): B POS
ANTIBODY SCREEN: NEGATIVE
UNIT DIVISION: 0

## 2016-10-04 LAB — MRSA PCR SCREENING: MRSA by PCR: NEGATIVE

## 2016-10-04 MED ORDER — LEVOFLOXACIN IN D5W 750 MG/150ML IV SOLN
750.0000 mg | INTRAVENOUS | Status: AC
  Administered 2016-10-04: 750 mg via INTRAVENOUS
  Filled 2016-10-04: qty 150

## 2016-10-04 MED ORDER — FUROSEMIDE 40 MG PO TABS: 40.0000 mg | ORAL_TABLET | Freq: Two times a day (BID) | ORAL | Status: DC

## 2016-10-04 MED ORDER — FENTANYL CITRATE (PF) 100 MCG/2ML IJ SOLN
12.5000 ug | Freq: Once | INTRAMUSCULAR | Status: AC
  Administered 2016-10-04: 12.5 ug via INTRAVENOUS
  Filled 2016-10-04: qty 2

## 2016-10-04 MED ORDER — MORPHINE SULFATE (PF) 2 MG/ML IV SOLN
1.0000 mg | INTRAVENOUS | Status: DC | PRN
  Administered 2016-10-04 – 2016-10-05 (×4): 1 mg via INTRAVENOUS
  Filled 2016-10-04 (×3): qty 1

## 2016-10-04 MED ORDER — LORAZEPAM 0.5 MG PO TABS
0.5000 mg | ORAL_TABLET | Freq: Once | ORAL | Status: AC
  Administered 2016-10-04: 0.5 mg via ORAL
  Filled 2016-10-04: qty 1

## 2016-10-04 MED ORDER — OXYCODONE HCL 5 MG PO TABS
10.0000 mg | ORAL_TABLET | Freq: Four times a day (QID) | ORAL | Status: DC | PRN
  Administered 2016-10-04 (×2): 10 mg via ORAL
  Filled 2016-10-04 (×2): qty 2

## 2016-10-04 MED ORDER — POTASSIUM CHLORIDE CRYS ER 20 MEQ PO TBCR
40.0000 meq | EXTENDED_RELEASE_TABLET | ORAL | Status: AC
  Administered 2016-10-04 (×2): 40 meq via ORAL
  Filled 2016-10-04 (×2): qty 2

## 2016-10-04 MED ORDER — LEVOFLOXACIN IN D5W 750 MG/150ML IV SOLN
750.0000 mg | INTRAVENOUS | Status: DC
  Administered 2016-10-06 – 2016-10-08 (×2): 750 mg via INTRAVENOUS
  Filled 2016-10-04 (×3): qty 150

## 2016-10-04 MED ORDER — MORPHINE SULFATE (PF) 2 MG/ML IV SOLN
INTRAVENOUS | Status: AC
  Filled 2016-10-04: qty 1

## 2016-10-04 NOTE — Progress Notes (Signed)
Subjective/Objective Febrile earlier tonight. Called approx 6:15 am with new onset hypotension. Starting fluid bolus.  Scheduled Meds: . atorvastatin  40 mg Oral QHS  . carvedilol  6.25 mg Oral BID WC  . clopidogrel  75 mg Oral Daily  . feeding supplement (ENSURE ENLIVE)  237 mL Oral BID BM  . furosemide  40 mg Intravenous BID  . insulin aspart  0-9 Units Subcutaneous TID WC  . [START ON 10/06/2016] levofloxacin (LEVAQUIN) IV  750 mg Intravenous Q48H  . oxyCODONE  10 mg Oral Q6H  . polyethylene glycol  17 g Oral Daily  . sodium chloride  250 mL Intravenous Once   Continuous Infusions: . sodium chloride Stopped (10/03/16 1716)   PRN Meds:sodium chloride, acetaminophen **OR** acetaminophen, bisacodyl, fentaNYL, midazolam, ondansetron **OR** ondansetron (ZOFRAN) IV  Vital signs in last 24 hours: Temp:  [97.1 F (36.2 C)-101 F (38.3 C)] 100.1 F (37.8 C) (12/16 0631) Pulse Rate:  [56-88] 80 (12/16 0658) Resp:  [11-20] 16 (12/16 0658) BP: (59-135)/(29-81) 90/37 (12/16 0658) SpO2:  [89 %-98 %] 95 % (12/16 0658) Weight:  [102.7 kg (226 lb 8 oz)] 102.7 kg (226 lb 8 oz) (12/16 0337)  Intake/Output last 3 shifts: I/O last 3 completed shifts: In: 365 [I.V.:30; Blood:335] Out: [id number] [Urine:2700] Intake/Output this shift: No intake/output data recorded.  Physical exam (brief/focused) Vitals:   10/04/16 0337 10/04/16 0631 10/04/16 0646 10/04/16 0658  BP: (!) 87/41 (!) 59/29 (!) 74/34 (!) 90/37  Pulse: 84 79 62 80  Resp: 16 16  16   Temp: 100.3 F (37.9 C) 100.1 F (37.8 C)    TempSrc: Rectal Rectal    SpO2: 93% (!) 89%  95%  Weight: 102.7 kg (226 lb 8 oz)     Height:       General: mild confusion but otherwise comfortable. HR :RRR no murmur Lungs: clear, no increase in work of breathinb  Problem Assessment/Plan  Fever of unknown origin Blood cultures obtained earlier in the evening and started on IV levofloxacin.   Hypotension Possible sepsis. Receiving 2L NS with  improving BP. Transfer to SDU for closer monitoring.

## 2016-10-04 NOTE — Progress Notes (Signed)
Patient becoming increasingly agitated; pulling at tubes, thrashing in bed, constantly shouting at his family and stating "I can't get comfortable! It hurts!!" Patient had received 10mg  prn oxy IR at 1030 and stated he did not get any relief. Dr. Wynelle Cleveland notified and new orders received. Will monitor.  Joellen [place], RN.

## 2016-10-04 NOTE — Progress Notes (Signed)
ANTICOAGULATION CONSULT NOTE - Follow Up Consult  Pharmacy Consult for Coumadin Indication: atrial fibrillation  No Known Allergies  Patient Measurements: Height: 5\' 10"  (177.8 cm) Weight: 226 lb 8 oz (102.7 kg) IBW/kg (Calculated) : 73  Vital Signs: Temp: 100.1 F (37.8 C) (12/16 0631) Temp Source: Rectal (12/16 0631) BP: 97/53 (12/16 0708) Pulse Rate: 75 (12/16 0708)  Labs:  Recent Labs  10/02/16 0754 10/02/16 0808 10/02/16 1321 10/02/16 1640 10/02/16 1816 10/03/16 0156 10/03/16 0755 10/03/16 1912 10/04/16 0256  HGB  --   --   --   --   --  7.8* 8.3* 8.6* 8.7*  HCT  --   --   --   --   --  24.9* 27.4* 28.1* 28.4*  PLT  --   --   --   --   --  94* 106*  --  92*  LABPROT  --  39.7*  --   --   --  46.3*  --   --  45.6*  INR  --  3.96  --   --   --  4.80*  --   --  4.71*  CREATININE  --   --   --  2.07*  --  2.15*  --   --  2.32*  TROPONINI 0.05*  --  0.06*  --  0.06*  --   --   --   --     Estimated Creatinine Clearance: 36.1 mL/min (by C-G formula based on SCr of 2.32 mg/dL (H)).  Assessment: 35y yoM on Coumadin PTA for afib - last dose 12/13. C/O lower back pain x 6 weeks, CT spine showed multiple lytic lesions concerning for metastatic disease. Original plan was to transition to heparin for possible biopsy of the lesions, but IR decided to hold off. Oncology consulted who decided to perform biopsy 12/15 despite INR 4.8. INR remains supratherapeutic this morning at 4.71, CBC low but stable, no S/Sx bleeding noted.  PTA Coumadin dose: 6mg  daily  Goal of Therapy:  INR 2-3 Monitor platelets by anticoagulation protocol: Yes   Plan:  -Hold coumadin tonight -Follow INR, S/Sx bleeding daily  Arrie Senate, PharmD PGY-1 Pharmacy Resident Pager: [phone] 10/04/2016

## 2016-10-04 NOTE — Progress Notes (Signed)
PROGRESS NOTE    Richard Vang  SKA:[id number] DOB: 1946-06-18 DOA: 10/01/2016  PCP: Sandi Mariscal, MD   Brief Narrative:  Richard Vang is a 70 y.o. male with history of CAD status post stenting recently in October, atrial fibrillation, cardiomyopathy, diabetes mellitus has been experiencing low back pain over the last 6 weeks. Patient had followed up with his PCP yesterday and was referred to cardiologist because of shortness of breath and patient was eventually referred to the ER because of concerns for possible PE. Patient was complaining of increasing low back pain over the last 6 weeks with even at rest. Denies any incontinence of urine or bowels. Lab work in the ER show hypercalcemia with calcium more than 14. CT scan of the lumbar spine and abdomen shows lytic lesions concerning for multiple myeloma. Patient states he is getting short of breath mostly because of the low back pain. Denies any chest pain. EKG shows A. fib with PVCs.  Subjective: Events of last night noted- fever, confusion, hypotension.   Assessment & Plan:   Principal Problem: Hypercalcemia,  Low back pain with lytic lesions in the CT scan concerning for multiple myeloma  - constellation of findings that point towards MM include anemia, ^Ca+ and bone lytic lesions -  oncology consulted  - underwent bone marrow biopsy 12/15 - Ca > 15- aggressively treating - Lasix , Pamidronate, Calcitonin s/c for 48 hrs - down to 11.8 today - skeletal survey: Lytic lesion RIGHT iliac bone, RIGHT acromion, LEFT femur, RIGHT carpal bones   - urine immunofixation>> bence jones protein + - increased kappa light chains on protein electrophoresis  Active Problems:  AKI on CKD 3 - in setting of fluid overload- may be related to MM as above- following    Fever  - 101 last night with hypotension- received 1 L NS and Levaquin - CXR suggestive of possible RLL infiltrate- ? Aspiration yesterday- was sedated and then confused and agitated  possibly from medications given for biopsy -  f/u cultures  On and off confusion/ Low back pain  - has been using Oxycodone at home but is not enough- increased to 10 mg - which is helping- laxatives ordered - ? If confusion is due to narcotics - following  Anemia, borderline low WBC count and thrombocytopenia - Hb 15.3 in 1/12- 11 on admission- now 8.4- follow - likely due to MM (if that is indeed the diagnosis)  - anemia panel results to be reviewed by hematology Iron/TIBC/Ferritin/ %Sat    Component Value Date/Time   IRON 59 10/02/2016 1640   TIBC 479 (H) 10/02/2016 1640   FERRITIN 101 10/02/2016 1640   IRONPCTSAT 12 (L) 10/02/2016 1640   - Vit B12 pending low norma- given s/c 1000 mg on 12/15 - checking stool occults as he is on blood thinners - pending    Essential hypertension Coreg- dose decreased from 18.75 to 6.25 due to hypotension    CAD S/P percutaneous coronary angioplasty with drug eluting stent in LAD  -- no longer on Aspirin at home- cont Plavix- stent was placed in Nov of last year  Acute on chronic sCHF- ischemic cardiomyopathy - EF ~25 % by LV Gram 10/17- no ECHO in system- will obtain ECHO to assess EF - mod L effusion, pedal edema and mild anasarca (on CT) - Coreg - holding Lasix today due to hypotension last night - no ACE at this time due to AKI- on Lisinopril at home  Hypokalemia - replacing- due to  Lasix  Mildly elevated TN I - no chest pain- flat trend- due to AKI vs fluid overload  Mildly elevated d dimer -not hypoxic-  may be due to underlying cancer- already on Coumadin    Chronic atrial fibrillation  - CHA2DS2-VASc Score 4 - coreg, Coumadin- rate controlled  Cirrhosis  - h/o Hep C  DM2 - holding Metformin- SSI for now   DVT prophylaxis: Coumadin Code Status: Full code Family Communication: wife Disposition Plan: cont to follow on tele Consultants:    Procedures:    Antimicrobials:  Anti-infectives    Start     Dose/Rate  Route Frequency Ordered Stop   10/06/16 0600  levofloxacin (LEVAQUIN) IVPB 750 mg     750 mg 100 mL/hr over 90 Minutes Intravenous Every 48 hours 10/04/16 0251     10/04/16 0300  levofloxacin (LEVAQUIN) IVPB 750 mg     750 mg 100 mL/hr over 90 Minutes Intravenous NOW 10/04/16 0251 10/04/16 0525       Objective: Vitals:   10/04/16 1030 10/04/16 1100 10/04/16 1130 10/04/16 1300  BP: (!) 89/77 121/71 123/77 (!) 92/59  Pulse: 82 92 77 78  Resp: (!) 30 (!) 24 (!) 22 14  Temp:  98.2 F (36.8 C)    TempSrc:  Oral    SpO2: 97% 91% 94% 95%  Weight:      Height:        Intake/Output Summary (Last 24 hours) at 10/04/16 1528 Last data filed at 10/04/16 1210  Gross per 24 hour  Intake              725 ml  Output             1550 ml  Net             -825 ml   Filed Weights   10/03/16 0331 10/04/16 0337 10/04/16 0745  Weight: 104.1 kg (229 lb 9.6 oz) 102.7 kg (226 lb 8 oz) 102.3 kg (225 lb 8.5 oz)    Examination: General exam: Appears comfortable  HEENT: PERRLA, oral mucosa moist, no sclera icterus or thrush Respiratory system: Clear to auscultation. Respiratory effort normal. Cardiovascular system: S1 & S2 heard, RRR.  No murmurs  Gastrointestinal system: Abdomen soft, non-tender, nondistended. Normal bowel sound. No organomegaly Central nervous system: Alert and oriented. No focal neurological deficits. Extremities: No cyanosis, clubbing -  pedal edema improving Skin: No rashes or ulcers Psychiatry:  Mood & affect appropriate.     Data Reviewed: I have personally reviewed following labs and imaging studies  CBC:  Recent Labs Lab 10/01/16 1729 10/03/16 0156 10/03/16 0755 10/03/16 1912 10/04/16 0256  WBC 4.1 3.5* 4.4  --  2.5*  HGB 8.4* 7.8* 8.3* 8.6* 8.7*  HCT 27.4* 24.9* 27.4* 28.1* 28.4*  MCV 101.1* 100.8* 101.1*  --  101.1*  PLT 120* 94* 106*  --  92*   Basic Metabolic Panel:  Recent Labs Lab 10/01/16 1729 10/02/16 0326 10/02/16 1640 10/03/16 0156  10/04/16 0256  NA 146* 143 147* 144 149*  K 3.7 3.1* 3.2* 3.5 3.3*  CL 105 104 108 107 110  CO2 _0 GLUCOSE 100* 93 95 120* 120*  BUN 44* 46* 45* 46* 48*  CREATININE 2.25* 2.07* 2.07* 2.15* 2.32*  CALCIUM >15.0* 14.4*  14.4* 14.9* 13.0* 11.8*  MG  --   --   --  2.5*  --   PHOS  --  4.6  --   --   --  GFR: Estimated Creatinine Clearance: 36 mL/min (by C-G formula based on SCr of 2.32 mg/dL (H)). Liver Function Tests:  Recent Labs Lab 10/02/16 1640  AST 35  ALT 17  ALKPHOS 110  BILITOT 1.3*  PROT 5.9*  ALBUMIN 4.1   No results for input(s): LIPASE, AMYLASE in the last 168 hours. No results for input(s): AMMONIA in the last 168 hours. Coagulation Profile:  Recent Labs Lab 10/01/16 1729 10/02/16 0808 10/03/16 0156 10/04/16 0256  INR 3.03 3.96 4.80* 4.71*   Cardiac Enzymes:  Recent Labs Lab 10/02/16 0754 10/02/16 1321 10/02/16 1816  TROPONINI 0.05* 0.06* 0.06*   BNP (last 3 results) No results for input(s): PROBNP in the last 8760 hours. HbA1C: No results for input(s): HGBA1C in the last 72 hours. CBG:  Recent Labs Lab 10/03/16 1640 10/03/16 2129 10/04/16 0641 10/04/16 0952 10/04/16 1122  GLUCAP 122* 111* 121* 129* 132*   Lipid Profile: No results for input(s): CHOL, HDL, LDLCALC, Richard, CHOLHDL, LDLDIRECT in the last 72 hours. Thyroid Function Tests: No results for input(s): TSH, T4TOTAL, FREET4, T3FREE, THYROIDAB in the last 72 hours. Anemia Panel:  Recent Labs  10/02/16 1507 10/02/16 1640 10/03/16 0156 10/03/16 1526  VITAMINB12  --   --   --  219  FOLATE  --   --  7.6  --   FERRITIN  --  101  --   --   TIBC  --  479*  --   --   IRON  --  59  --   --   RETICCTPCT 1.5  --   --   --    Urine analysis:    Component Value Date/Time   COLORURINE YELLOW 10/04/2016 0313   APPEARANCEUR CLEAR 10/04/2016 0313   LABSPEC 1.011 10/04/2016 0313   PHURINE 6.0 10/04/2016 0313   GLUCOSEU NEGATIVE 10/04/2016 0313   HGBUR MODERATE (A)  10/04/2016 0313   BILIRUBINUR NEGATIVE 10/04/2016 0313   KETONESUR NEGATIVE 10/04/2016 0313   PROTEINUR NEGATIVE 10/04/2016 0313   UROBILINOGEN 0.2 11/03/2010 1649   NITRITE NEGATIVE 10/04/2016 0313   LEUKOCYTESUR TRACE (A) 10/04/2016 0313   Sepsis Labs: _0 (procalcitonin:4,lacticidven:4) ) Recent Results (from the past 240 hour(s))  MRSA PCR Screening     Status: None   Collection Time: 10/04/16  8:38 AM  Result Value Ref Range Status   MRSA by PCR NEGATIVE NEGATIVE Final    Comment:        The GeneXpert MRSA Assay (FDA approved for NASAL specimens only), is one component of a comprehensive MRSA colonization surveillance program. It is not intended to diagnose MRSA infection nor to guide or monitor treatment for MRSA infections.          Radiology Studies: Dg Chest 1 View  Result Date: 10/03/2016 CLINICAL DATA:  Chest pain.  Right iliac crest biopsy in CT. EXAM: CHEST 1 VIEW COMPARISON:  10/02/2016 FINDINGS: Cardiac enlargement. Progression of vascular congestion and bilateral edema. Progression of right effusion. Bibasilar atelectasis. Widening of the superior mediastinum compatible with vascular ectasia. IMPRESSION: Congestive heart failure with progression of edema and right pleural effusion. Bibasilar atelectasis. Limited evaluation of skeletal structures. No displaced rib fracture identified. Electronically Signed   By: Franchot Gallo M.D.   On: 10/03/2016 11:19   Ct Biopsy  Result Date: 10/03/2016 INDICATION: 70 year old male with a history of possible multiple myeloma. EXAM: CT BIOPSY MEDICATIONS: None. ANESTHESIA/SEDATION: Moderate (conscious) sedation was employed during this procedure. A total of Versed 1.5 mg and Fentanyl 50 mcg was administered intravenously.  Moderate Sedation Time: 21 minutes. The patient's level of consciousness and vital signs were monitored continuously by radiology nursing throughout the procedure under my direct supervision.  FLUOROSCOPY TIME:  CT COMPLICATIONS: None PROCEDURE: The procedure risks, benefits, and alternatives were explained to the patient. Questions regarding the procedure were encouraged and answered. The patient understands and consents to the procedure. Patient was attempted to position into the prone position. He was unable to comfortably achieve this. When he was turning himself into a supine position, he experienced left-sided chest pain. Scout CT of the pelvis was performed for surgical planning purposes. The anterior pelvis was prepped with Betadinein a sterile fashion, and a sterile drape was applied covering the operative field. A sterile gown and sterile gloves were used for the procedure. Local anesthesia was provided with 1% Lidocaine. We targeted the right anterior iliac bone for biopsy. The skin and subcutaneous tissues were infiltrated with 1% lidocaine without epinephrine. A small stab incision was made with an 11 blade scalpel, and an 11 gauge Murphy needle was advanced with CT guidance to the posterior cortex. Manual forced was used to advance the needle through the posterior cortex and the stylet was removed. A bone marrow aspirate was retrieved and passed to a cytotechnologist in the room. The Murphy needle was then advanced without the stylet for a core biopsy. The core biopsy was retrieved and also passed to a cytotechnologist. Manual pressure was used for hemostasis and a sterile dressing was placed. No complications were encountered no significant blood loss was encountered. Patient tolerated the procedure well and remained hemodynamically stable throughout. IMPRESSION: Status post CT-guided bone marrow biopsy, with tissue specimen sent to pathology for complete histopathologic analysis Signed, Dulcy Fanny. Earleen Newport, DO [facility] PLAN: Given the patient's acute discomfort during repositioning on the table, a chest x-ray will be acquired to  investigate for a possible left-sided rib fracture. Electronically Signed   By: Corrie Mckusick D.O.   On: 10/03/2016 11:24   Dg Chest Port 1 View  Result Date: 10/04/2016 CLINICAL DATA:  Fever EXAM: PORTABLE CHEST 1 VIEW COMPARISON:  10/03/2016 FINDINGS: Cardiomegaly with vascular congestion. Diffuse interstitial prominence throughout the lungs. More focal airspace opacity throughout the right lung with probable small right effusion. IMPRESSION: Cardiomegaly, possible mild interstitial edema. Focal opacities in the right could reflect pneumonia. Small right effusion. Electronically Signed   By: Rolm Baptise M.D.   On: 10/04/2016 08:19   Dg Bone Survey Met  Result Date: 10/02/2016 CLINICAL DATA:  Low back pain for months, metastatic bone survey. Followup lytic lesion RIGHT iliac bone. Pending bone marrow biopsy for suspected multiple myeloma. EXAM: METASTATIC BONE SURVEY COMPARISON:  CT abdomen and pelvis/lumbar spine October 02, 2016 and CT chest November 03, 2010 FINDINGS: Skull:  No destructive bony lesions. RIGHT upper extremity: Small lytic lesion RIGHT acromion, not included on on prior chest CT. Widened scapholunate interval most compatible with old ligamentous injury associated with severe radiocarpal osteoarthrosis. Tiny lucency in the triquetrum bone. Moderate vascular calcifications. LEFT upper extremity: Surgical screw proximal LEFT humerus. No destructive bony lesions. Moderate vascular calcifications. Cervical spine: Advanced degenerative change without destructive bony lesions. Symmetric vascular calcifications in the neck. Thoracic spine: Moderate chronic lower thoracic compression fractures. No destructive bony lesions. Lumbar spine: Old L4 mild to moderate compression fracture, mild L3 old compression fracture. No destructive bony lesions. Pelvis: No acute osseous process. Lytic lesion RIGHT iliac bone corresponding to today's CT finding. RIGHT lower extremity: No  destructive bony lesions  or acute osseous process. LEFT lower extremity: Small lytic lesion proximal LEFT femur diaphysis involving the intramedullary cavity and cortex. No acute osseous process. Chest: Cardiomegaly. Pulmonary vascular congestion. RIGHT pleural effusion better seen on today's CT abdomen and pelvis. Generalized osteopenia. IMPRESSION: Lytic lesion RIGHT iliac bone corresponding to today's CT finding. Additional small lytic lesions RIGHT acromion, LEFT femur, RIGHT carpal bones, concerning for metastatic disease in the setting of suspected neoplasm. Osteopenia. Electronically Signed   By: Elon Alas M.D.   On: 10/02/2016 22:01      Scheduled Meds: . atorvastatin  40 mg Oral QHS  . carvedilol  6.25 mg Oral BID WC  . clopidogrel  75 mg Oral Daily  . feeding supplement (ENSURE ENLIVE)  237 mL Oral BID BM  . insulin aspart  0-9 Units Subcutaneous TID WC  . [START ON 10/06/2016] levofloxacin (LEVAQUIN) IV  750 mg Intravenous Q48H  . polyethylene glycol  17 g Oral Daily  . sodium chloride  250 mL Intravenous Once   Continuous Infusions: . sodium chloride Stopped (10/03/16 1716)     LOS: 2 days    Time spent in minutes: 30    [place], MD [facility] Pager: [email or web address] Password [email or web address] 10/04/2016, 3:28 PM

## 2016-10-04 NOTE — Progress Notes (Signed)
PT Cancellation Note  Patient Details Name: Richard Vang MRN: [id number] DOB: 06-24-1946   Cancelled Treatment:    Reason Eval/Treat Not Completed: Medical issues which prohibited therapy.  Rapid response called and has high INR, will check later as time and pt allow.   Ramond Dial 10/04/2016, 9:56 AM    Mee Hives, PT MS Acute Rehab Dept. Number: [place]

## 2016-10-04 NOTE — Progress Notes (Signed)
Called per floor RN at [phone] regarding Pt with low BP 59/29. Advised to start fluid bolus and update NP on call. Pt seen at bedside awake oriented to self location time, denies pain, follows commands. Denies SOB or dizziness. No respiratory distress. Fluid bolus infusing. Jonette Eva [facility] NP at bedside to assess patient. BP improved quickly. Orders received to transfer to SDU. Bed obtained on [place]. Report provided per 2 [employer]. BP 97/53 75 bpm 18 RR 96% on 2 LNC. Pt to be transfered shortly. RRT to follow today.

## 2016-10-04 NOTE — Significant Event (Signed)
Rapid Response Event Note Called per floor RN regarding Pt with worsening agitation tonight. Per wife at bedside Pt has been having episodes at night where he acts strange and yells out. She believes it is due to the narcotics he is using for back pain. RN concerned Pt having weakness in left arm, discoordination, shuffling gait, and confusion. [facility] NP paged and updated on status. Ativan 0.5 mg PO ordered and given just prior to my arrival.  Overview: Time Called: 0200 Arrival Time: 0208 Event Type: Neurologic  Initial Focused Assessment: Pt found sitting on side of bed mild agitation holding on to walker. Unable to state name intially but with prompting able to state name and follow simple commands. Equal grips both hands, weakness noted in bilateral arms but not focal. Pupils equal reactive size 2. Moves legs with equal strength. Lungs clear Po2 94% on 2 LNC, no distress at this time. ABD soft. +2 edema in bilateral lower extremities.   Interventions: Pt assisted back in bed. Rectal temp obtained yielding 101. [facility] NP M.Lynch paged and updated on VS, blood cultures, urine culture and lactic acid ordered. Rectal tylenol given.   Plan of Care (if not transferred): Pt left resting in bed, appears more comfortable when positioned on side. Denies pain other than back pain but nods off to sleep. RN awaiting lab for draw. Advised RN to monitor Pt closely and recheck VS later tonght. RN to update Provider and myself for worsening changes.   Event Summary: Name of Physician Notified: M/ Donnal Debar [facility] NP at 2015    at    Outcome: Stayed in room and stabalized     [place], [place]

## 2016-10-04 NOTE — Progress Notes (Signed)
Pharmacy Antibiotic Note  Richard Vang is a 70 y.o. male admitted on 10/01/2016 with CAP.  Pharmacy has been consulted for Levaquin dosing.  Plan: Levaquin 750 mg IV q48h Will f/u micro data, renal function, and pt's clinical condition  Height: 5\' 10"  (177.8 cm) Weight: 229 lb 9.6 oz (104.1 kg) IBW/kg (Calculated) : 73  Temp (24hrs), Avg:98.5 F (36.9 C), Min:97.1 F (36.2 C), Max:101 F (38.3 C)   Recent Labs Lab 10/01/16 1729 10/02/16 0326 10/02/16 1640 10/03/16 0156 10/03/16 0755  WBC 4.1  --   --  3.5* 4.4  CREATININE 2.25* 2.07* 2.07* 2.15*  --     Estimated Creatinine Clearance: 39.2 mL/min (by C-G formula based on SCr of 2.15 mg/dL (H)).    No Known Allergies  Antimicrobials this admission: 12/16 Levaquin >>   Microbiology results:  BCx x2:   UCx:    Thank you for allowing pharmacy to be a part of this patient's care.  Sherlon Handing, PharmD, BCPS Clinical pharmacist, pager [phone] 10/04/2016 2:47 AM

## 2016-10-05 ENCOUNTER — Inpatient Hospital Stay (HOSPITAL_COMMUNITY): Payer: Medicare HMO

## 2016-10-05 LAB — CBC
HEMATOCRIT: 29.1 % — AB (ref 39.0–52.0)
HEMOGLOBIN: 8.6 g/dL — AB (ref 13.0–17.0)
MCH: 30.2 pg (ref 26.0–34.0)
MCHC: 29.6 g/dL — AB (ref 30.0–36.0)
MCV: 102.1 fL — ABNORMAL HIGH (ref 78.0–100.0)
Platelets: 78 10*3/uL — ABNORMAL LOW (ref 150–400)
RBC: 2.85 MIL/uL — AB (ref 4.22–5.81)
RDW: 20.1 % — ABNORMAL HIGH (ref 11.5–15.5)
WBC: 2.2 10*3/uL — ABNORMAL LOW (ref 4.0–10.5)

## 2016-10-05 LAB — BASIC METABOLIC PANEL
ANION GAP: 9 (ref 5–15)
BUN: 57 mg/dL — ABNORMAL HIGH (ref 6–20)
CALCIUM: 10.8 mg/dL — AB (ref 8.9–10.3)
CO2: 28 mmol/L (ref 22–32)
Chloride: 119 mmol/L — ABNORMAL HIGH (ref 101–111)
Creatinine, Ser: 2.42 mg/dL — ABNORMAL HIGH (ref 0.61–1.24)
GFR, EST AFRICAN AMERICAN: 30 mL/min — AB (ref >60)
GFR, EST NON AFRICAN AMERICAN: 26 mL/min — AB (ref >60)
Glucose, Bld: 104 mg/dL — ABNORMAL HIGH (ref 65–99)
POTASSIUM: 4 mmol/L (ref 3.5–5.1)
Sodium: 156 mmol/L — ABNORMAL HIGH (ref 135–145)

## 2016-10-05 LAB — GLUCOSE, CAPILLARY
GLUCOSE-CAPILLARY: 101 mg/dL — AB (ref 65–99)
GLUCOSE-CAPILLARY: 105 mg/dL — AB (ref 65–99)
Glucose-Capillary: 113 mg/dL — ABNORMAL HIGH (ref 65–99)
Glucose-Capillary: 106 mg/dL — ABNORMAL HIGH (ref 65–99)

## 2016-10-05 LAB — CALCIUM: Calcium: 10.7 mg/dL — ABNORMAL HIGH (ref 8.9–10.3)

## 2016-10-05 LAB — PROTIME-INR
INR: 3.95
PROTHROMBIN TIME: 39.6 s — AB (ref 11.4–15.2)

## 2016-10-05 LAB — ALBUMIN: ALBUMIN: 3.8 g/dL (ref 3.5–5.0)

## 2016-10-05 MED ORDER — DIPHENHYDRAMINE HCL 25 MG PO CAPS
25.0000 mg | ORAL_CAPSULE | ORAL | Status: DC | PRN
  Administered 2016-10-05 – 2016-10-08 (×4): 25 mg via ORAL
  Filled 2016-10-05 (×4): qty 1

## 2016-10-05 MED ORDER — MORPHINE SULFATE ER 15 MG PO TBCR
15.0000 mg | EXTENDED_RELEASE_TABLET | Freq: Two times a day (BID) | ORAL | Status: DC
  Administered 2016-10-05 – 2016-10-08 (×7): 15 mg via ORAL
  Filled 2016-10-05 (×7): qty 1

## 2016-10-05 MED ORDER — MORPHINE SULFATE 15 MG PO TABS
15.0000 mg | ORAL_TABLET | ORAL | Status: DC | PRN
  Administered 2016-10-08: 15 mg via ORAL
  Filled 2016-10-05: qty 1

## 2016-10-05 MED ORDER — DEXTROSE 5 % IV SOLN
INTRAVENOUS | Status: AC
  Administered 2016-10-05: 09:00:00 via INTRAVENOUS

## 2016-10-05 NOTE — Progress Notes (Signed)
PROGRESS NOTE    Richard Vang  IRS:[id number] DOB: Jan 15, 1946 DOA: 10/01/2016  PCP: Sandi Mariscal, MD   Brief Narrative:  Richard Vang is a 70 y.o. male with history of CAD status post stenting recently in October, atrial fibrillation, cardiomyopathy, diabetes mellitus has been experiencing low back pain over the last 6 weeks. Patient had followed up with his PCP yesterday and was referred to cardiologist because of shortness of breath and patient was eventually referred to the ER because of concerns for possible PE. Patient was complaining of increasing low back pain over the last 6 weeks with even at rest. Denies any incontinence of urine or bowels. Lab work in the ER show hypercalcemia with calcium more than 14. CT scan of the lumbar spine and abdomen shows lytic lesions concerning for multiple myeloma. Patient states he is getting short of breath mostly because of the low back pain. Denies any chest pain. EKG shows A. fib with PVCs.  Attempting to work up for mulitple myeloma, obtain bone marrow biopsy, resolve fluid overload and treat hypercalcemia and his lumbar pain.  On the night after biopsy he became febrile hypoxic and hypotensive- transferred to SDU- ? Aspiration. He was confused ? Due to pain meds vs meds given during biopsy.  Subjective: Significant back pain through the night. No other complaints. Quite tired now and wants to sleep.  Assessment & Plan:   Principal Problem: Hypercalcemia,  lytic lesions in the CT scan concerning for multiple myeloma  - constellation of findings that point towards MM include anemia, ^Ca+ and bone lytic lesions -  oncology consulted  - underwent bone marrow biopsy 12/15 - Ca > 15- aggressively treating - Lasix , Pamidronate, Calcitonin s/c for 48 hrs - down to 10.7today - skeletal survey: Lytic lesion RIGHT iliac bone, RIGHT acromion, LEFT femur, RIGHT carpal bones   - urine immunofixation>> bence jones protein + - increased kappa light chains on  protein electrophoresis  Active Problems:  AKI on CKD 3 - may be related to MM as above- slow rise may be due to Lasix and now poor oral intake  Hypernatremia  - sodium steadily increasing- Lasix stopped 2 days ago and not eating well now- start slow D5W and follow as he was fluid overloaded on admission.   Pleural effusion  - on admission- improved with a few days of Lasix- ? Due to CHF   Fever with hypoxia and hypotension - 12/15 101-  received 1 L NS and Levaquin - CXR suggestive of possible RLL infiltrate- ? Aspiration - was sedated and then confused and agitated possibly from medications given for biopsy -  f/u cultures- >> NGTD - fevers resolved for now- cont Levaquin - NOTE: he has an odd breathing pattern and seems to be hyperventilating at times- per patient and wife, this is normal for him  On and off confusion/ Low back pain  - started when he bent over to pick up a heavy bucked about 6 wks ago and states it worse in his spine lumbar area - L3 L4 compression fx noted on imaging appear to be old - has been using Oxycodone at home but is not enough- increased to 10 mg - - ? If confusion is due to narcotics - switch Oxycodone to oral Morphine which (via IV) is not making him confused  Anemia, borderline low WBC count and thrombocytopenia - Hb 15.3 in 1/12- 11 on admission- now 8.4- follow - likely due to MM (if that is indeed  the diagnosis)  - anemia panel results to be reviewed by hematology Iron/TIBC/Ferritin/ %Sat    Component Value Date/Time   IRON 59 10/02/2016 1640   TIBC 479 (H) 10/02/2016 1640   FERRITIN 101 10/02/2016 1640   IRONPCTSAT 12 (L) 10/02/2016 1640   - Vit B12 pending low norma- given s/c 1000 mg on 12/15 - checking stool occults as he is on blood thinners - pending    Essential hypertension Coreg- dose decreased from 18.75 to 6.25 due to hypotension    CAD S/P percutaneous coronary angioplasty with drug eluting stent in Vang  -- no longer on  Aspirin at home - cont Plavix- stent was placed in Nov of last year - statin, Coreg  Acute on chronic sCHF- ischemic cardiomyopathy - EF ~25 % by LV Gram 10/17- no ECHO in system- obtained ECHO to assess EF- EF now 55-60% but likely has diastolic CHF althou not directly mentioned on ECHO- also has mod pulm , tricuspid, aortic regurg with dilated RV but normal RV function - mod L effusion, pedal edema and mild anasarca (on CT) on admission - Coreg - holding Lasix due to hypotension occurring with fever - no ACE at this time due to AKI- on Lisinopril at home  Hypokalemia - replaced- due to Lasix  Mildly elevated TN I - no chest pain- flat trend- due to AKI vs fluid overload  Mildly elevated d dimer -not hypoxic-  may be due to underlying cancer- already on Coumadin    Chronic atrial fibrillation  - CHA2DS2-VASc Score 4 - coreg (dose decreased due to hypotension), Coumadin- rate controlled  Cirrhosis  - h/o Hep C  DM2 - holding Metformin due to renal failure- SSI for now   DVT prophylaxis: Coumadin Code Status: Full code Family Communication: wife Disposition Plan: cont to follow on SDU today- follow pulse ox, mental status and back pain- suspect he may need SNF Consultants:    Procedures:    Antimicrobials:  Anti-infectives    Start     Dose/Rate Route Frequency Ordered Stop   10/06/16 0600  levofloxacin (LEVAQUIN) IVPB 750 mg     750 mg 100 mL/hr over 90 Minutes Intravenous Every 48 hours 10/04/16 0251     10/04/16 0300  levofloxacin (LEVAQUIN) IVPB 750 mg     750 mg 100 mL/hr over 90 Minutes Intravenous NOW 10/04/16 0251 10/04/16 0525       Objective: Vitals:   10/04/16 2321 10/05/16 0331 10/05/16 0700 10/05/16 0726  BP: (!) 141/75 96/65  128/90  Pulse: 76 67  71  Resp: '16 16  16  ' Temp: 97.8 F (36.6 C) 98 F (36.7 C) 97.9 F (36.6 C)   TempSrc: Oral Oral Oral   SpO2: 93% 91%  97%  Weight:  103.1 kg (227 lb 4.7 oz)    Height:        Intake/Output  Summary (Last 24 hours) at 10/05/16 0813 Last data filed at 10/05/16 0745  Gross per 24 hour  Intake              480 ml  Output             2000 ml  Net            -1520 ml   Filed Weights   10/04/16 0337 10/04/16 0745 10/05/16 0331  Weight: 102.7 kg (226 lb 8 oz) 102.3 kg (225 lb 8.5 oz) 103.1 kg (227 lb 4.7 oz)    Examination: General exam: Appears comfortable  but quite sleepy HEENT: PERRLA, oral mucosa moist, no sclera icterus or thrush Respiratory system: Clear to auscultation. Respiratory effort normal. Cardiovascular system: S1 & S2 heard, RRR.  No murmurs  Gastrointestinal system: Abdomen soft, non-tender, nondistended. Normal bowel sound. No organomegaly Central nervous system: Alert and oriented. No focal neurological deficits. Extremities: No cyanosis, clubbing -  pedal edema improving Skin: No rashes or ulcers Psychiatry:  Mood & affect appropriate.     Data Reviewed: I have personally reviewed following labs and imaging studies  CBC:  Recent Labs Lab 10/01/16 1729 10/03/16 0156 10/03/16 0755 10/03/16 1912 10/04/16 0256 10/05/16 0634  WBC 4.1 3.5* 4.4  --  2.5* 2.2*  HGB 8.4* 7.8* 8.3* 8.6* 8.7* 8.6*  HCT 27.4* 24.9* 27.4* 28.1* 28.4* 29.1*  MCV 101.1* 100.8* 101.1*  --  101.1* 102.1*  PLT 120* 94* 106*  --  92* 78*   Basic Metabolic Panel:  Recent Labs Lab 10/02/16 0326 10/02/16 1640 10/03/16 0156 10/04/16 0256 10/05/16 0634  NA 143 147* 144 149* 156*  K 3.1* 3.2* 3.5 3.3* 4.0  CL 104 108 107 110 119*  CO2 '25 29 29 27 28  ' GLUCOSE 93 95 120* 120* 104*  BUN 46* 45* 46* 48* 57*  CREATININE 2.07* 2.07* 2.15* 2.32* 2.42*  CALCIUM 14.4*  14.4* 14.9* 13.0* 11.8* 10.8*  MG  --   --  2.5*  --   --   PHOS 4.6  --   --   --   --    GFR: Estimated Creatinine Clearance: 34.6 mL/min (by C-G formula based on SCr of 2.42 mg/dL (H)). Liver Function Tests:  Recent Labs Lab 10/02/16 1640  AST 35  ALT 17  ALKPHOS 110  BILITOT 1.3*  PROT 5.9*    ALBUMIN 4.1   No results for input(s): LIPASE, AMYLASE in the last 168 hours. No results for input(s): AMMONIA in the last 168 hours. Coagulation Profile:  Recent Labs Lab 10/01/16 1729 10/02/16 0808 10/03/16 0156 10/04/16 0256 10/05/16 0634  INR 3.03 3.96 4.80* 4.71* 3.95   Cardiac Enzymes:  Recent Labs Lab 10/02/16 0754 10/02/16 1321 10/02/16 1816  TROPONINI 0.05* 0.06* 0.06*   BNP (last 3 results) No results for input(s): PROBNP in the last 8760 hours. HbA1C: No results for input(s): HGBA1C in the last 72 hours. CBG:  Recent Labs Lab 10/04/16 0641 10/04/16 0952 10/04/16 1122 10/04/16 1727 10/04/16 2121  GLUCAP 121* 129* 132* 122* 125*   Lipid Profile: No results for input(s): CHOL, HDL, LDLCALC, TRIG, CHOLHDL, LDLDIRECT in the last 72 hours. Thyroid Function Tests: No results for input(s): TSH, T4TOTAL, FREET4, T3FREE, THYROIDAB in the last 72 hours. Anemia Panel:  Recent Labs  10/02/16 1507 10/02/16 1640 10/03/16 0156 10/03/16 1526  VITAMINB12  --   --   --  219  FOLATE  --   --  7.6  --   FERRITIN  --  101  --   --   TIBC  --  479*  --   --   IRON  --  59  --   --   RETICCTPCT 1.5  --   --   --    Urine analysis:    Component Value Date/Time   COLORURINE YELLOW 10/04/2016 [place] 10/04/2016 0313   LABSPEC 1.011 10/04/2016 0313   PHURINE 6.0 10/04/2016 0313   GLUCOSEU NEGATIVE 10/04/2016 0313   HGBUR MODERATE (A) 10/04/2016 0313   BILIRUBINUR NEGATIVE 10/04/2016 0313   KETONESUR NEGATIVE 10/04/2016  [place] 10/04/2016 0313   UROBILINOGEN 0.2 11/03/2010 1649   NITRITE NEGATIVE 10/04/2016 0313   LEUKOCYTESUR TRACE (A) 10/04/2016 0313   Sepsis Labs: '@LABRCNTIP' (procalcitonin:4,lacticidven:4) ) Recent Results (from the past 240 hour(s))  MRSA PCR Screening     Status: None   Collection Time: 10/04/16  8:38 AM  Result Value Ref Range Status   MRSA by PCR NEGATIVE NEGATIVE Final    Comment:        The  GeneXpert MRSA Assay (FDA approved for NASAL specimens only), is one component of a comprehensive MRSA colonization surveillance program. It is not intended to diagnose MRSA infection nor to guide or monitor treatment for MRSA infections.          Radiology Studies: Dg Chest 1 View  Result Date: 10/03/2016 CLINICAL DATA:  Chest pain.  Right iliac crest biopsy in CT. EXAM: CHEST 1 VIEW COMPARISON:  10/02/2016 FINDINGS: Cardiac enlargement. Progression of vascular congestion and bilateral edema. Progression of right effusion. Bibasilar atelectasis. Widening of the superior mediastinum compatible with vascular ectasia. IMPRESSION: Congestive heart failure with progression of edema and right pleural effusion. Bibasilar atelectasis. Limited evaluation of skeletal structures. No displaced rib fracture identified. Electronically Signed   By: Franchot Gallo M.D.   On: 10/03/2016 11:19   Ct Biopsy  Result Date: 10/03/2016 INDICATION: 70 year old male with a history of possible multiple myeloma. EXAM: CT BIOPSY MEDICATIONS: None. ANESTHESIA/SEDATION: Moderate (conscious) sedation was employed during this procedure. A total of Versed 1.5 mg and Fentanyl 50 mcg was administered intravenously. Moderate Sedation Time: 21 minutes. The patient's level of consciousness and vital signs were monitored continuously by radiology nursing throughout the procedure under my direct supervision. FLUOROSCOPY TIME:  CT COMPLICATIONS: None PROCEDURE: The procedure risks, benefits, and alternatives were explained to the patient. Questions regarding the procedure were encouraged and answered. The patient understands and consents to the procedure. Patient was attempted to position into the prone position. He was unable to comfortably achieve this. When he was turning himself into a supine position, he experienced left-sided chest pain. Scout CT of the pelvis was performed for surgical planning purposes. The anterior pelvis  was prepped with Betadinein a sterile fashion, and a sterile drape was applied covering the operative field. A sterile gown and sterile gloves were used for the procedure. Local anesthesia was provided with 1% Lidocaine. We targeted the right anterior iliac bone for biopsy. The skin and subcutaneous tissues were infiltrated with 1% lidocaine without epinephrine. A small stab incision was made with an 11 blade scalpel, and an 11 gauge Murphy needle was advanced with CT guidance to the posterior cortex. Manual forced was used to advance the needle through the posterior cortex and the stylet was removed. A bone marrow aspirate was retrieved and passed to a cytotechnologist in the room. The Murphy needle was then advanced without the stylet for a core biopsy. The core biopsy was retrieved and also passed to a cytotechnologist. Manual pressure was used for hemostasis and a sterile dressing was placed. No complications were encountered no significant blood loss was encountered. Patient tolerated the procedure well and remained hemodynamically stable throughout. IMPRESSION: Status post CT-guided bone marrow biopsy, with tissue specimen sent to pathology for complete histopathologic analysis Signed, Dulcy Fanny. Earleen Newport, DO [facility] Radiology PLAN: Given the patient's acute discomfort during repositioning on the table, a chest x-ray will be acquired to investigate for a possible left-sided rib fracture. Electronically Signed   By: York Cerise  Earleen Newport D.O.   On: 10/03/2016 11:24   Dg Chest Port 1 View  Result Date: 10/05/2016 CLINICAL DATA:  Hypoxia EXAM: PORTABLE CHEST 1 VIEW COMPARISON:  10/04/2016 FINDINGS: Cardiomegaly with vascular congestion. Improving interstitial opacities, likely improving interstitial edema. Mild edema persists. Small right pleural effusion noted. IMPRESSION: Mild CHF, slightly improved since prior study. Electronically Signed   By: Rolm Baptise M.D.    On: 10/05/2016 07:42   Dg Chest Port 1 View  Result Date: 10/04/2016 CLINICAL DATA:  Fever EXAM: PORTABLE CHEST 1 VIEW COMPARISON:  10/03/2016 FINDINGS: Cardiomegaly with vascular congestion. Diffuse interstitial prominence throughout the lungs. More focal airspace opacity throughout the right lung with probable small right effusion. IMPRESSION: Cardiomegaly, possible mild interstitial edema. Focal opacities in the right could reflect pneumonia. Small right effusion. Electronically Signed   By: Rolm Baptise M.D.   On: 10/04/2016 08:19      Scheduled Meds: . atorvastatin  40 mg Oral QHS  . carvedilol  6.25 mg Oral BID WC  . clopidogrel  75 mg Oral Daily  . feeding supplement (ENSURE ENLIVE)  237 mL Oral BID BM  . insulin aspart  0-9 Units Subcutaneous TID WC  . [START ON 10/06/2016] levofloxacin (LEVAQUIN) IV  750 mg Intravenous Q48H  . morphine  15 mg Oral Q12H  . polyethylene glycol  17 g Oral Daily  . sodium chloride  250 mL Intravenous Once   Continuous Infusions: . sodium chloride Stopped (10/03/16 1716)     LOS: 3 days    Time spent in minutes: 80    [place], MD [facility] Pager: [email or web address] Password [email or web address] 10/05/2016, 8:13 AM

## 2016-10-05 NOTE — Progress Notes (Signed)
ANTICOAGULATION CONSULT NOTE - Follow Up Consult  Pharmacy Consult for Coumadin Indication: atrial fibrillation  No Known Allergies  Patient Measurements: Height: 5\' 10"  (177.8 cm) Weight: 227 lb 4.7 oz (103.1 kg) IBW/kg (Calculated) : 73  Vital Signs: Temp: 97.9 F (36.6 C) (12/17 0700) Temp Source: Oral (12/17 0700) BP: 128/90 (12/17 0726) Pulse Rate: 71 (12/17 0726)  Labs:  Recent Labs  10/02/16 1321  10/02/16 1816  10/03/16 0156 10/03/16 0755 10/03/16 1912 10/04/16 0256 10/05/16 0634  HGB  --   --   --   < > 7.8* 8.3* 8.6* 8.7* 8.6*  HCT  --   --   --   < > 24.9* 27.4* 28.1* 28.4* 29.1*  PLT  --   --   --   < > 94* 106*  --  92* 78*  LABPROT  --   --   --   --  46.3*  --   --  45.6* 39.6*  INR  --   --   --   --  4.80*  --   --  4.71* 3.95  CREATININE  --   < >  --   --  2.15*  --   --  2.32* 2.42*  TROPONINI 0.06*  --  0.06*  --   --   --   --   --   --   < > = values in this interval not displayed.  Estimated Creatinine Clearance: 34.6 mL/min (by C-G formula based on SCr of 2.42 mg/dL (H)).  Assessment: 89y yoM on Coumadin PTA for afib - last dose 12/13. C/O lower back pain x 6 weeks, CT spine showed multiple lytic lesions concerning for metastatic disease. Original plan was to transition to heparin for possible biopsy of the lesions, but IR decided to hold off. Oncology consulted who decided to perform biopsy 12/15 despite INR 4.8. INR remains supratherapeutic this morning at 3.95 but is beginning to trend down. CBC low but stable, no S/Sx bleeding noted.  PTA Coumadin dose: 6mg  daily  Goal of Therapy:  INR 2-3 Monitor platelets by anticoagulation protocol: Yes   Plan:  -Hold coumadin tonight -Follow INR, S/Sx bleeding daily  Arrie Senate, PharmD PGY-1 Pharmacy Resident Pager: [phone] 10/05/2016

## 2016-10-06 ENCOUNTER — Other Ambulatory Visit: Payer: Self-pay | Admitting: Hematology

## 2016-10-06 DIAGNOSIS — C9000 Multiple myeloma not having achieved remission: Principal | ICD-10-CM

## 2016-10-06 DIAGNOSIS — I5041 Acute combined systolic (congestive) and diastolic (congestive) heart failure: Secondary | ICD-10-CM

## 2016-10-06 DIAGNOSIS — E538 Deficiency of other specified B group vitamins: Secondary | ICD-10-CM

## 2016-10-06 LAB — CBC
HCT: 29.4 % — ABNORMAL LOW (ref 39.0–52.0)
Hemoglobin: 8.8 g/dL — ABNORMAL LOW (ref 13.0–17.0)
MCH: 30.9 pg (ref 26.0–34.0)
MCHC: 29.9 g/dL — AB (ref 30.0–36.0)
MCV: 103.2 fL — AB (ref 78.0–100.0)
PLATELETS: 73 10*3/uL — AB (ref 150–400)
RBC: 2.85 MIL/uL — ABNORMAL LOW (ref 4.22–5.81)
RDW: 19.7 % — AB (ref 11.5–15.5)
WBC: 2.1 10*3/uL — ABNORMAL LOW (ref 4.0–10.5)

## 2016-10-06 LAB — BASIC METABOLIC PANEL
Anion gap: 9 (ref 5–15)
BUN: 50 mg/dL — AB (ref 6–20)
CHLORIDE: 117 mmol/L — AB (ref 101–111)
CO2: 28 mmol/L (ref 22–32)
CREATININE: 1.97 mg/dL — AB (ref 0.61–1.24)
Calcium: 9.4 mg/dL (ref 8.9–10.3)
GFR calc Af Amer: 38 mL/min — ABNORMAL LOW (ref >60)
GFR calc non Af Amer: 33 mL/min — ABNORMAL LOW (ref >60)
Glucose, Bld: 100 mg/dL — ABNORMAL HIGH (ref 65–99)
POTASSIUM: 3.5 mmol/L (ref 3.5–5.1)
Sodium: 154 mmol/L — ABNORMAL HIGH (ref 135–145)

## 2016-10-06 LAB — MULTIPLE MYELOMA PANEL, SERUM
ALBUMIN SERPL ELPH-MCNC: 3.8 g/dL (ref 2.9–4.4)
Albumin/Glob SerPl: 1.4 (ref 0.7–1.7)
Alpha 1: 0.4 g/dL (ref 0.0–0.4)
Alpha2 Glob SerPl Elph-Mcnc: 0.8 g/dL (ref 0.4–1.0)
B-GLOBULIN SERPL ELPH-MCNC: 1.2 g/dL (ref 0.7–1.3)
Gamma Glob SerPl Elph-Mcnc: 0.6 g/dL (ref 0.4–1.8)
Globulin, Total: 2.9 g/dL (ref 2.2–3.9)
IgA: 28 mg/dL — ABNORMAL LOW (ref 61–437)
IgG (Immunoglobin G), Serum: 618 mg/dL — ABNORMAL LOW (ref 700–1600)
IgM, Serum: <5 mg/dL — ABNORMAL LOW (ref 20–172)
TOTAL PROTEIN ELP: 6.7 g/dL (ref 6.0–8.5)

## 2016-10-06 LAB — GLUCOSE, CAPILLARY
GLUCOSE-CAPILLARY: 103 mg/dL — AB (ref 65–99)
Glucose-Capillary: 117 mg/dL — ABNORMAL HIGH (ref 65–99)
Glucose-Capillary: 109 mg/dL — ABNORMAL HIGH (ref 65–99)
Glucose-Capillary: 131 mg/dL — ABNORMAL HIGH (ref 65–99)

## 2016-10-06 LAB — PROTIME-INR
INR: 3.64
Prothrombin Time: 37.1 seconds — ABNORMAL HIGH (ref 11.4–15.2)

## 2016-10-06 LAB — URINE CULTURE: Culture: 30000 — AB

## 2016-10-06 MED ORDER — WARFARIN - PHARMACIST DOSING INPATIENT: Freq: Every day | Status: DC

## 2016-10-06 MED ORDER — CYANOCOBALAMIN 1000 MCG/ML IJ SOLN
1000.0000 ug | Freq: Every day | INTRAMUSCULAR | Status: DC
  Administered 2016-10-07 – 2016-10-08 (×2): 1000 ug via SUBCUTANEOUS
  Filled 2016-10-06 (×2): qty 1

## 2016-10-06 MED ORDER — DEXTROSE-NACL 5-0.2 % IV SOLN
INTRAVENOUS | Status: DC
  Administered 2016-10-06 – 2016-10-07 (×2): via INTRAVENOUS

## 2016-10-06 NOTE — Care Management Note (Signed)
Case Management Note  Patient Details  Name: GIACOMO VALONE MRN: [id number] Date of Birth: 01/31/1946  Subjective/Objective:    Transferred to SDU on 12/16, after attempting work up for multiple myeloma  ( ct showing lytic lesions concern for mm), and obtaining a  Bone marrow bx, on 12/15 patient became febrile , hypoxic and hypotensive, also has ckd , poor po intake, pl effusion. Patient is from home with ex wife, he has a shower chair , rollator and commode extender pre previous NCM note. Await pt eval.  NCM will cont to follow for dc needs.                Action/Plan:   Expected Discharge Date:  10/04/16               Expected Discharge Plan:     In-House Referral:     Discharge planning Services  CM Consult  Post Acute Care Choice:    Choice offered to:     DME Arranged:    DME Agency:     HH Arranged:    HH Agency:     Status of Service:  In process, will continue to follow  If discussed at Long Length of Stay Meetings, dates discussed:    Additional Comments:  Zenon Mayo, RN 10/06/2016, 12:36 PM

## 2016-10-06 NOTE — Progress Notes (Signed)
PROGRESS NOTE    Richard Vang  RFF:[id number] DOB: 06-04-1946 DOA: 10/01/2016  PCP: Sandi Mariscal, MD   Brief Narrative:  Richard Vang is a 70 y.o. male with history of CAD status post stenting recently in October, atrial fibrillation, cardiomyopathy, diabetes mellitus has been experiencing low back pain over the last 6 weeks. Patient had followed up with his PCP yesterday and was referred to cardiologist because of shortness of breath and patient was eventually referred to the ER because of concerns for possible PE. Patient was complaining of increasing low back pain over the last 6 weeks with even at rest. Denies any incontinence of urine or bowels. Lab work in the ER show hypercalcemia with calcium more than 14. CT scan of the lumbar spine and abdomen shows lytic lesions concerning for multiple myeloma. Patient states he is getting short of breath mostly because of the low back pain. Denies any chest pain. EKG shows A. fib with PVCs.  Attempting to work up for mulitple myeloma,  status post bone marrow biopsy, resolve fluid overload and treat hypercalcemia and his lumbar pain.  On the night after biopsy he became febrile hypoxic and hypotensive- transferred to SDU- ? Aspiration.    Subjective: Significant back pain through the night. No other complaints. Quite tired now and wants to sleep.  Assessment & Plan:   Hypercalcemia,  lytic lesions in the CT scan concerning for multiple myeloma  - constellation of findings that point towards MM include anemia, ^Ca+ and bone lytic lesions -  oncology consulted by Debbe Odea, MD, see note by Brunetta Genera, MD 12/14 - underwent bone marrow biopsy 12/15 - Ca > 15- aggressively treating - Lasix , Pamidronate, Calcitonin s/c for 48 hrs - down to  9.4 today - skeletal survey: Lytic lesion RIGHT iliac bone, RIGHT acromion, LEFT femur, RIGHT carpal bones   - urine immunofixation>> bence jones protein + - increased kappa light chains on protein  electrophoresis PSA 2.9    AKI on CKD 3 - may be related to MM as above- slow rise may be due to Lasix and now poor oral intake   Hypernatremia  - sodium steadily increasing- Lasix stopped 2 days ago and not eating well now  Now started on D5 1/4 normal saline  Pleural effusion  - on admission- improved with a few days of Lasix- ? Due to CHF    Fever with hypoxia and hypotension secondary to pneumonia - 12/15 -  received 1 L NS and Levaquin - CXR suggestive of possible RLL infiltrate- ? Aspiration - was sedated and then confused and agitated possibly from medications given for biopsy -  f/u cultures- >> NGTD - fevers resolved for now- cont Levaquin    On and off confusion/ Low back pain  - started when he bent over to pick up a heavy bucked about 6 wks ago and states it worse in his spine lumbar area - L3 L4 compression fx noted on imaging appear to be old - has been using Oxycodone at home but is not enough- increased to 10 mg - - ? If confusion is due to narcotics - switch Oxycodone to oral Morphine which (via IV) is not making him confused  Anemia, borderline low WBC count and thrombocytopenia - Hb 15.3 in 1/12- 11 on admission- now 8.4- follow - likely due to MM (if that is indeed the diagnosis)  Transfuse when necessary to keep the hemoglobin close to/above 9  given his significant cardiac comorbidities  as per hematology     Component Value Date/Time   IRON 59 10/02/2016 1640   TIBC 479 (H) 10/02/2016 1640   FERRITIN 101 10/02/2016 1640   IRONPCTSAT 12 (L) 10/02/2016 1640   - Vit B12 pending low norma- given s/c 1000 mg on 12/15      Essential hypertension Coreg- dose decreased from 18.75 to 6.25 due to hypotension    CAD S/P percutaneous coronary angioplasty with drug eluting stent in LAD  -- no longer on Aspirin at home - cont Plavix- stent was placed in Nov of last year - statin, Coreg  Acute on chronic sCHF- ischemic cardiomyopathy - EF ~25 % by LV Gram  10/17- no ECHO in system- obtained ECHO to assess EF- EF now 55-60% but likely has diastolic CHF althou not directly mentioned on ECHO- also has mod pulm , tricuspid, aortic regurg with dilated RV but normal RV function - mod L effusion, pedal edema and mild anasarca (on CT) on admission - Coreg - holding Lasix due to hypotension occurring with fever - no ACE at this time due to AKI- on Lisinopril at home   Hypokalemia - replaced- due to Lasix  Mildly elevated TN I - no chest pain- flat trend- due to AKI vs fluid overload  Mildly elevated d dimer -not hypoxic-  may be due to underlying cancer- already on Coumadin    Chronic atrial fibrillation  - CHA2DS2-VASc Score 4 - coreg (dose decreased due to hypotension), Coumadin- rate controlled Pharmacy managing   anticoagulation  Cirrhosis  - h/o Hep C  DM2 - holding Metformin due to renal failure- SSI for now   DVT prophylaxis: Coumadin Code Status: Full code Family Communication: wife Disposition Plan: cont to follow on SDU today- follow pulse ox, mental status and back pain- suspect he may need SNF  Consultants:    Procedures:    Antimicrobials:  Anti-infectives    Start     Dose/Rate Route Frequency Ordered Stop   10/06/16 0600  levofloxacin (LEVAQUIN) IVPB 750 mg     750 mg 100 mL/hr over 90 Minutes Intravenous Every 48 hours 10/04/16 0251     10/04/16 0300  levofloxacin (LEVAQUIN) IVPB 750 mg     750 mg 100 mL/hr over 90 Minutes Intravenous NOW 10/04/16 0251 10/04/16 0525       Objective: Vitals:   10/06/16 0700 10/06/16 0800 10/06/16 0918 10/06/16 1219  BP:   (!) 151/94 (!) 108/95  Pulse: 62 (!) 49 69 87  Resp: _0 Temp:   97.8 F (36.6 C) 97.5 F (36.4 C)  TempSrc:   Axillary Oral  SpO2: 96% 96% 94% 98%  Weight:      Height:        Intake/Output Summary (Last 24 hours) at 10/06/16 1303 Last data filed at 10/06/16 0800  Gross per 24 hour  Intake           1587.5 ml  Output              1750 ml  Net           -162.5 ml   Filed Weights   10/04/16 0745 10/05/16 0331 10/06/16 0500  Weight: 102.3 kg (225 lb 8.5 oz) 103.1 kg (227 lb 4.7 oz) 101.4 kg (223 lb 8.7 oz)    Examination: General exam: Appears comfortable but quite sleepy HEENT: PERRLA, oral mucosa moist, no sclera icterus or thrush Respiratory system: Clear to auscultation. Respiratory effort normal. Cardiovascular system:  S1 & S2 heard, RRR.  No murmurs  Gastrointestinal system: Abdomen soft, non-tender, nondistended. Normal bowel sound. No organomegaly Central nervous system: Alert and oriented. No focal neurological deficits. Extremities: No cyanosis, clubbing -  pedal edema improving Skin: No rashes or ulcers Psychiatry:  Mood & affect appropriate.     Data Reviewed: I have personally reviewed following labs and imaging studies  CBC:  Recent Labs Lab 10/03/16 0156 10/03/16 0755 10/03/16 1912 10/04/16 0256 10/05/16 0634 10/06/16 0553  WBC 3.5* 4.4  --  2.5* 2.2* 2.1*  HGB 7.8* 8.3* 8.6* 8.7* 8.6* 8.8*  HCT 24.9* 27.4* 28.1* 28.4* 29.1* 29.4*  MCV 100.8* 101.1*  --  101.1* 102.1* 103.2*  PLT 94* 106*  --  92* 78* 73*   Basic Metabolic Panel:  Recent Labs Lab 10/02/16 0326 10/02/16 1640 10/03/16 0156 10/04/16 0256 10/05/16 0634 10/05/16 0818 10/06/16 0553  NA 143 147* 144 149* 156*  --  154*  K 3.1* 3.2* 3.5 3.3* 4.0  --  3.5  CL 104 108 107 110 119*  --  117*  CO2 _0 --  28  GLUCOSE 93 95 120* 120* 104*  --  100*  BUN 46* 45* 46* 48* 57*  --  50*  CREATININE 2.07* 2.07* 2.15* 2.32* 2.42*  --  1.97*  CALCIUM 14.4*  14.4* 14.9* 13.0* 11.8* 10.8* 10.7* 9.4  MG  --   --  2.5*  --   --   --   --   PHOS 4.6  --   --   --   --   --   --    GFR: Estimated Creatinine Clearance: 42.2 mL/min (by C-G formula based on SCr of 1.97 mg/dL (H)). Liver Function Tests:  Recent Labs Lab 10/02/16 1640 10/05/16 0818  AST 35  --   ALT 17  --   ALKPHOS 110  --   BILITOT 1.3*  --     PROT 5.9*  --   ALBUMIN 4.1 3.8   No results for input(s): LIPASE, AMYLASE in the last 168 hours. No results for input(s): AMMONIA in the last 168 hours. Coagulation Profile:  Recent Labs Lab 10/02/16 0808 10/03/16 0156 10/04/16 0256 10/05/16 0634 10/06/16 0553  INR 3.96 4.80* 4.71* 3.95 3.64   Cardiac Enzymes:  Recent Labs Lab 10/02/16 0754 10/02/16 1321 10/02/16 1816  TROPONINI 0.05* 0.06* 0.06*   BNP (last 3 results) No results for input(s): PROBNP in the last 8760 hours. HbA1C: No results for input(s): HGBA1C in the last 72 hours. CBG:  Recent Labs Lab 10/05/16 0835 10/05/16 1234 10/05/16 1646 10/05/16 2125 10/06/16 1221  GLUCAP 101* 113* 106* 105* 117*   Lipid Profile: No results for input(s): CHOL, HDL, LDLCALC, TRIG, CHOLHDL, LDLDIRECT in the last 72 hours. Thyroid Function Tests: No results for input(s): TSH, T4TOTAL, FREET4, T3FREE, THYROIDAB in the last 72 hours. Anemia Panel:  Recent Labs  10/03/16 1526  VITAMINB12 219   Urine analysis:    Component Value Date/Time   COLORURINE YELLOW 10/04/2016 [place] 10/04/2016 0313   LABSPEC 1.011 10/04/2016 0313   PHURINE 6.0 10/04/2016 0313   GLUCOSEU NEGATIVE 10/04/2016 0313   HGBUR MODERATE (A) 10/04/2016 0313   BILIRUBINUR NEGATIVE 10/04/2016 0313   KETONESUR NEGATIVE 10/04/2016 0313   PROTEINUR NEGATIVE 10/04/2016 0313   UROBILINOGEN 0.2 11/03/2010 1649   NITRITE NEGATIVE 10/04/2016 0313   LEUKOCYTESUR TRACE (A) 10/04/2016 0313   Sepsis Labs: _1 (procalcitonin:4,lacticidven:4) ) Recent Results (  from the past 240 hour(s))  Culture, Urine     Status: Abnormal   Collection Time: 10/04/16  2:42 AM  Result Value Ref Range Status   Specimen Description URINE, RANDOM  Final   Special Requests NONE  Final   Culture 30,000 COLONIES/mL ENTEROCOCCUS FAECALIS (A)  Final   Report Status 10/06/2016 FINAL  Final   Organism ID, Bacteria ENTEROCOCCUS FAECALIS (A)  Final       Susceptibility   Enterococcus faecalis - MIC*    AMPICILLIN <=2 SENSITIVE Sensitive     LEVOFLOXACIN 1 SENSITIVE Sensitive     NITROFURANTOIN <=16 SENSITIVE Sensitive     VANCOMYCIN 1 SENSITIVE Sensitive     * 30,000 COLONIES/mL ENTEROCOCCUS FAECALIS  Culture, blood (Routine X 2) w Reflex to ID Panel     Status: None (Preliminary result)   Collection Time: 10/04/16  2:56 AM  Result Value Ref Range Status   Specimen Description BLOOD RIGHT HAND  Final   Special Requests IN PEDIATRIC BOTTLE 2CC  Final   Culture NO GROWTH 1 DAY  Final   Report Status PENDING  Incomplete  Culture, blood (Routine X 2) w Reflex to ID Panel     Status: None (Preliminary result)   Collection Time: 10/04/16  3:02 AM  Result Value Ref Range Status   Specimen Description BLOOD LEFT ANTECUBITAL  Final   Special Requests BOTTLES DRAWN AEROBIC AND ANAEROBIC 5CC  Final   Culture NO GROWTH 1 DAY  Final   Report Status PENDING  Incomplete  MRSA PCR Screening     Status: None   Collection Time: 10/04/16  8:38 AM  Result Value Ref Range Status   MRSA by PCR NEGATIVE NEGATIVE Final    Comment:        The GeneXpert MRSA Assay (FDA approved for NASAL specimens only), is one component of a comprehensive MRSA colonization surveillance program. It is not intended to diagnose MRSA infection nor to guide or monitor treatment for MRSA infections.          Radiology Studies: Dg Chest Port 1 View  Result Date: 10/05/2016 CLINICAL DATA:  Hypoxia EXAM: PORTABLE CHEST 1 VIEW COMPARISON:  10/04/2016 FINDINGS: Cardiomegaly with vascular congestion. Improving interstitial opacities, likely improving interstitial edema. Mild edema persists. Small right pleural effusion noted. IMPRESSION: Mild CHF, slightly improved since prior study. Electronically Signed   By: Rolm Baptise M.D.   On: 10/05/2016 07:42      Scheduled Meds: . atorvastatin  40 mg Oral QHS  . carvedilol  6.25 mg Oral BID WC  . clopidogrel  75 mg Oral  Daily  . feeding supplement (ENSURE ENLIVE)  237 mL Oral BID BM  . insulin aspart  0-9 Units Subcutaneous TID WC  . levofloxacin (LEVAQUIN) IV  750 mg Intravenous Q48H  . morphine  15 mg Oral Q12H  . polyethylene glycol  17 g Oral Daily  . sodium chloride  250 mL Intravenous Once   Continuous Infusions: . sodium chloride Stopped (10/03/16 1716)     LOS: 4 days    Time spent in minutes: 66    Alleigh Mollica, MD [facility] Pager: [email or web address] Password [email or web address] 10/06/2016, 1:03 PM

## 2016-10-06 NOTE — Evaluation (Signed)
Physical Therapy Evaluation Patient Details Name: Richard Vang MRN: [id number] DOB: 1946-02-28 Today's Date: 10/06/2016   History of Present Illness  Patient is a 70 yo male admitted 10/01/16 with low back pain.  CT showed lytic lesions with w/u - multiple myeloma.  Patient also with SOB and hypercalcemia.  PMH:   hypertension, diabetes, dyslipidemia, coronary artery disease status post drug-eluting PCI on 07/31/2016 and newly noted possibly ischemic cardiomyopathy ejection fraction 25-35%    Clinical Impression  Patient presents with problems listed below.  Will benefit from acute PT to maximize functional mobility prior to discharge.  Patient independent pta.  Today requiring max assist for bed mobility and unable to ambulate - limited by pain, decreased cognition, and weakness.  Recommend ST-SNF for continued therapy at discharge.    Follow Up Recommendations SNF;Supervision/Assistance - 24 hour    Equipment Recommendations  None recommended by PT    Recommendations for Other Services       Precautions / Restrictions Precautions Precautions: Fall Restrictions Weight Bearing Restrictions: No      Mobility  Bed Mobility Overal bed mobility: Needs Assistance Bed Mobility: Rolling;Sidelying to Sit;Sit to Sidelying Rolling: Mod assist Sidelying to sit: Max assist     Sit to sidelying: Max assist General bed mobility comments: Verbal and tactile cues for technique.  Increased assist required due to pain.  Patient able to sit EOB x 12 minutes with BUE support.  No physical assist to maintain sitting balance.    Transfers                 General transfer comment: NT due to pain.  Ambulation/Gait                Stairs            Wheelchair Mobility    Modified Rankin (Stroke Patients Only)       Balance Overall balance assessment: Needs assistance Sitting-balance support: Bilateral upper extremity supported;Feet supported Sitting balance-Leahy  Scale: Poor                                       Pertinent Vitals/Pain Pain Assessment: 0-10 Pain Score: 7  Pain Location: Low back with mobility Pain Descriptors / Indicators: Aching;Grimacing;Guarding;Sharp;Sore Pain Intervention(s): Limited activity within patient's tolerance;Monitored during session;Repositioned    Home Living Family/patient expects to be discharged to:: Private residence Living Arrangements: Children;Other relatives;Spouse/significant other (ex-wife, son, grands, and great-grands) Available Help at Discharge: Family;Available 24 hours/day Type of Home: House Home Access: Stairs to enter Entrance Stairs-Rails: Right Entrance Stairs-Number of Steps: 3 Home Layout: One level Home Equipment: Clinical cytogeneticist - 4 wheels;Walker - 2 wheels;Toilet riser;Wheelchair - manual      Prior Function Level of Independence: Independent         Comments: Working     Journalist, newspaper        Extremity/Trunk Assessment   Upper Extremity Assessment Upper Extremity Assessment: Overall WFL for tasks assessed    Lower Extremity Assessment Lower Extremity Assessment: Generalized weakness;Difficult to assess due to impaired cognition (Difficult to assess due to pain.)       Communication   Communication: No difficulties  Cognition Arousal/Alertness: Awake/alert Behavior During Therapy: [facility] for tasks assessed/performed;Flat affect;Anxious Overall Cognitive Status: Impaired/Different from baseline Area of Impairment: Orientation;Attention;Memory;Awareness Orientation Level: Disoriented to;Time;Situation Current Attention Level: Focused Memory: Decreased short-term memory  General Comments: Patient needs redirection to task.  Disoriented.    General Comments      Exercises     Assessment/Plan    PT Assessment Patient needs continued PT services  PT Problem List Decreased strength;Decreased activity tolerance;Decreased  balance;Decreased mobility;Decreased cognition;Decreased knowledge of use of DME;Cardiopulmonary status limiting activity;Obesity;Pain          PT Treatment Interventions DME instruction;Gait training;Functional mobility training;Therapeutic activities;Balance training;Cognitive remediation;Patient/family education    PT Goals (Current goals can be found in the Care Plan section)  Acute Rehab PT Goals Patient Stated Goal: Decrease pain PT Goal Formulation: With patient Time For Goal Achievement: 10/13/16 Potential to Achieve Goals: Fair    Frequency Min 3X/week   Barriers to discharge        Co-evaluation               End of Session   Activity Tolerance: Patient limited by pain Patient left: in bed;with call bell/phone within reach;with bed alarm set;with family/visitor present Nurse Communication: Mobility status         Time: 0093-8182 PT Time Calculation (min) (ACUTE ONLY): 29 min   Charges:   PT Evaluation $PT Eval High Complexity: 1 Procedure PT Treatments $Therapeutic Activity: 8-22 mins   PT G Codes:        Despina Pole 10/26/16, 7:02 PM Carita Pian. Sanjuana Kava, [place] Pager [phone]

## 2016-10-06 NOTE — Progress Notes (Signed)
[name]   HEMATOLOGY/ONCOLOGY INPATIENT PROGRESS NOTE  Date of Service: 10/06/2016  Inpatient Attending: .Reyne Dumas, MD   SUBJECTIVE  I met patient with his wife and pastor at bedside. I shared that in discussing with Dr. Tresa Moore - patient's sputum and a bone marrow biopsy shows 67% plasma cells which along with his urine and K/L SFLC is consistent with a diagnosis of multiple myeloma. Patient has been somewhat confused and asked his wife if he were already dead. Is being workup for FUO and is on empiric Abx for possible pneumonia. Low back and hip pain better controlled.  OBJECTIVE:  Resting in bed  PHYSICAL EXAMINATION: . Vitals:   10/06/16 0700 10/06/16 0800 10/06/16 0918 10/06/16 1219  BP:   (!) 151/94 (!) 108/95  Pulse: 62 (!) 49 69 87  Resp: _0 Temp:   97.8 F (36.6 C) 97.5 F (36.4 C)  TempSrc:   Axillary Oral  SpO2: 96% 96% 94% 98%  Weight:      Height:       Filed Weights   10/04/16 0745 10/05/16 0331 10/06/16 0500  Weight: 225 lb 8.5 oz (102.3 kg) 227 lb 4.7 oz (103.1 kg) 223 lb 8.7 oz (101.4 kg)   .Body mass index is 32.08 kg/m.   .  Intake/Output Summary (Last 24 hours) at 10/06/16 1542 Last data filed at 10/06/16 0800  Gross per 24 hour  Intake           1587.5 ml  Output             1750 ml  Net           -162.5 ml    GENERAL:resting, NAD SKIN: petechiae over upper extremities OROPHARYNX: MMM  NECK: supple,+ JVD, LYMPH:  no palpable lymphadenopathy in the cervical, axillary or inguinal LUNGS: Decreased entry bilaterally, few basal rales noted HEART: S1-S2 irreg ABDOMEN: abdomen soft, non-tender, normoactive bowel sounds, no hepatosplenomegaly palpable PSYCH: resting in bed NEURO: non focal.   MEDICAL HISTORY:  Past Medical History:  Diagnosis Date  . Arthritis    "left shoulder" (07/31/2016)  . Coronary artery disease   . GERD (gastroesophageal reflux disease)   . Heart murmur   . High cholesterol   . Hypertension   . Sleep  apnea    "probably; having test in November" (07/31/2016)  . Type II diabetes mellitus ([place])     SURGICAL HISTORY: Past Surgical History:  Procedure Laterality Date  . CARDIAC CATHETERIZATION N/A 07/31/2016   Procedure: Left Heart Cath and Coronary Angiography;  Surgeon: Lorretta Harp, MD;  Location: [facility];  Service: Cardiovascular;  Laterality: N/A;  . CARDIAC CATHETERIZATION N/A 07/31/2016   Procedure: Coronary Stent Intervention;  Surgeon: Lorretta Harp, MD;  Location: [facility];  Service: Cardiovascular;  Laterality: N/A;  . CORONARY ANGIOPLASTY    . SHOULDER SURGERY Left 1973   "put pin in it where it had separated"   . TONSILLECTOMY  ~ 1956    SOCIAL HISTORY: Social History   Social History  . Marital status: Divorced    Spouse name: N/A  . Number of children: N/A  . Years of education: N/A   Occupational History  . Not on file.   Social History Main Topics  . Smoking status: Never Smoker  . Smokeless tobacco: Never Used  . Alcohol use Yes     Comment: 07/31/2016 "nothing since 2002"  . Drug use: No  . Sexual activity: Not  Currently   Other Topics Concern  . Not on file   Social History Narrative  . No narrative on file    FAMILY HISTORY: Family History  Problem Relation Age of Onset  . Hypertension Other     ALLERGIES:  has No Known Allergies.  MEDICATIONS:  Scheduled Meds: . atorvastatin  40 mg Oral QHS  . carvedilol  6.25 mg Oral BID WC  . clopidogrel  75 mg Oral Daily  . feeding supplement (ENSURE ENLIVE)  237 mL Oral BID BM  . insulin aspart  0-9 Units Subcutaneous TID WC  . levofloxacin (LEVAQUIN) IV  750 mg Intravenous Q48H  . morphine  15 mg Oral Q12H  . polyethylene glycol  17 g Oral Daily  . sodium chloride  250 mL Intravenous Once  . Warfarin - Pharmacist Dosing Inpatient   Does not apply q1800   Continuous Infusions: . sodium chloride Stopped (10/03/16 1716)  . dextrose 5 % and 0.2 % NaCl 75 mL/hr at  10/06/16 1431   PRN Meds:.sodium chloride, acetaminophen **OR** acetaminophen, bisacodyl, diphenhydrAMINE, morphine, morphine injection, ondansetron **OR** ondansetron (ZOFRAN) IV  REVIEW OF SYSTEMS:    10 Point review of Systems was done is negative except as noted above.   LABORATORY DATA:  I have reviewed the data as listed  . CBC Latest Ref Rng & Units 10/06/2016 10/05/2016 10/04/2016  WBC 4.0 - 10.5 K/uL 2.1(L) 2.2(L) 2.5(L)  Hemoglobin 13.0 - 17.0 g/dL 8.8(L) 8.6(L) 8.7(L)  Hematocrit 39.0 - 52.0 % 29.4(L) 29.1(L) 28.4(L)  Platelets 150 - 400 K/uL 73(L) 78(L) 92(L)    . CMP Latest Ref Rng & Units 10/06/2016 10/05/2016 10/05/2016  Glucose 65 - 99 mg/dL 100(H) - 104(H)  BUN 6 - 20 mg/dL 50(H) - 57(H)  Creatinine 0.61 - 1.24 mg/dL 1.97(H) - 2.42(H)  Sodium 135 - 145 mmol/L 154(H) - 156(H)  Potassium 3.5 - 5.1 mmol/L 3.5 - 4.0  Chloride 101 - 111 mmol/L 117(H) - 119(H)  CO2 22 - 32 mmol/L 28 - 28  Calcium 8.9 - 10.3 mg/dL 9.4 10.7(H) 10.8(H)  Total Protein 6.5 - 8.1 g/dL - - -  Total Bilirubin 0.3 - 1.2 mg/dL - - -  Alkaline Phos 38 - 126 U/L - - -  AST 15 - 41 U/L - - -  ALT 17 - 63 U/L - - -   Component     Latest Ref Rng & Units 10/02/2016 10/03/2016  IgG (Immunoglobin G), Serum     700 - 1,600 mg/dL 618 (L)   IgA     61 - 437 mg/dL 28 (L)   IgM, Serum     20 - 172 mg/dL <5 (L)   Total Protein ELP     6.0 - 8.5 g/dL 6.7   Albumin SerPl Elph-Mcnc     2.9 - 4.4 g/dL 3.8   Alpha 1     0.0 - 0.4 g/dL 0.4   Alpha2 Glob SerPl Elph-Mcnc     0.4 - 1.0 g/dL 0.8   B-Globulin SerPl Elph-Mcnc     0.7 - 1.3 g/dL 1.2   Gamma Glob SerPl Elph-Mcnc     0.4 - 1.8 g/dL 0.6   M Protein SerPl Elph-Mcnc     Not Observed g/dL Not Observed   Globulin, Total     2.2 - 3.9 g/dL 2.9   Albumin/Glob SerPl     0.7 - 1.7 1.4   IFE 1      Comment   Please Note (HCV):  Comment   Iron     45 - 182 ug/dL 59   TIBC     250 - 450 ug/dL 479 (H)   Saturation Ratios     17.9 -  39.5 % 12 (L)   UIBC     ug/dL 420   Kappa free light chain     3.3 - 19.4 mg/L 700.0 (H)   Lamda free light chains     5.7 - 26.3 mg/L 7.4   Kappa, lamda light chain ratio     0.26 - 1.65 94.59 (H)   Immunofixation, Urine      Comment   Ferritin     24 - 336 ng/mL 101   Beta-2 Microglobulin     0.6 - 2.4 mg/L 7.4 (H)   LDH     98 - 192 U/L 281 (H)   PSA     0.00 - 4.00 ng/mL 2.90   Vitamin B12     180 - 914 pg/mL  219    RADIOGRAPHIC STUDIES: I have personally reviewed the radiological images as listed and agreed with the findings in the report. Ct Abdomen Pelvis Wo Contrast  Result Date: 10/02/2016 CLINICAL DATA:  70 year old male with abdominal pain and back pain x5 weeks. EXAM: CT ABDOMEN AND PELVIS WITHOUT CONTRAST TECHNIQUE: Multidetector CT imaging of the abdomen and pelvis was performed following the standard protocol without IV contrast. COMPARISON:  Abdominal CT dated 11/03/2010 FINDINGS: Evaluation of this exam is limited in the absence of intravenous contrast. Lower chest: Partially visualized moderate right pleural effusion with associated partial compressive atelectasis of the right lower lobe. Superimposed pneumonia is not excluded. Clinical correlation is recommended. The pleural effusion is new since the study of 20/12 2012. There is coronary vascular calcification. Top-normal cardiac size. There is hypoattenuation of the cardiac blood pool suggestive of a degree of anemia. Clinical correlation is recommended. No intra-abdominal free air. There is diffuse mesenteric stranding and small ascites. Hepatobiliary: Cirrhosis. There is no intrahepatic biliary ductal dilatation. Stones noted within the gallbladder. The gallbladder is predominantly contracted. Mild haziness of the gallbladder wall likely related to cirrhosis and ascites. Ultrasound may provide better evaluation if there is clinical concern for acute cholecystitis. Pancreas: Unremarkable. No pancreatic ductal  dilatation or surrounding inflammatory changes. Spleen: Normal in size without focal abnormality. Adrenals/Urinary Tract: The adrenal glands appear unremarkable. Multiple nonobstructing bilateral renal calculi measuring up to 4 mm. There is no hydronephrosis on either side. A 2 cm left renal inferior pole exophytic high attenuating lesions is incompletely characterized but most likely represents a complex/hemorrhagic cyst. Further evaluation with ultrasound is recommended. The visualized ureters and urinary bladder appear unremarkable. Stomach/Bowel: There is diverticulosis of the second portion of the duodenum at the head of the pancreas measuring up to 3.5 cm. No definite associated active inflammatory changes. Mild thickened appearance of the colonic folds may be related to underdistention or hepatic colopathy. Mild colitis is less likely but not entirely excluded. Correlation with clinical exam and stool cultures recommended. There is no evidence of bowel obstruction. Normal appendix. Vascular/Lymphatic: There is advanced aortoiliac atherosclerotic disease. There is no aneurysmal dilatation. The IVC is grossly unremarkable. Evaluation of the vasculature is limited in the absence of intravenous contrast. No portal venous gas identified. There is no adenopathy. Reproductive: Mild enlargement of the prostate gland measuring up to 5.3 cm in transverse axial diameter. Other: Small fat containing right inguinal hernia. There is mild diffuse subcutaneous edema and anasarca. No fluid collection. Musculoskeletal: There  is osteopenia with multilevel degenerative changes of the spine. Multilevel old-appearing compression deformity most prominent involving the L4 with approximately 30% loss of vertebral body height. No definite acute fracture. A 4.0 x 2.4 cm lytic lesion involving the right iliac bone demonstrates aggressive features suggest cortical breakthrough and is concerning for malignancy. Further evaluation with MRI  is recommended. IMPRESSION: Cirrhosis with small ascites and mild anasarca. Cholelithiasis. Duodenal diverticulosis. Hepatic colopathy versus less likely mild colitis. Correlation with clinical exam and stool cultures recommended. No bowel obstruction. Normal appendix. Moderate right pleural effusion with partial compressive atelectasis of the right lower lobe. Lytic lesion in the right iliac bone with associated cortical breakthrough. MRI is recommended for further evaluation. Nonobstructing bilateral renal calculi.  No hydronephrosis. Left renal inferior pole high attenuating lesion, incompletely characterized, likely a complex/hemorrhagic cyst. Ultrasound is recommended for further characterization. Electronically Signed   By: Anner Crete M.D.   On: 10/02/2016 01:30   Dg Chest 1 View  Result Date: 10/03/2016 CLINICAL DATA:  Chest pain.  Right iliac crest biopsy in CT. EXAM: CHEST 1 VIEW COMPARISON:  10/02/2016 FINDINGS: Cardiac enlargement. Progression of vascular congestion and bilateral edema. Progression of right effusion. Bibasilar atelectasis. Widening of the superior mediastinum compatible with vascular ectasia. IMPRESSION: Congestive heart failure with progression of edema and right pleural effusion. Bibasilar atelectasis. Limited evaluation of skeletal structures. No displaced rib fracture identified. Electronically Signed   By: Franchot Gallo M.D.   On: 10/03/2016 11:19   Ct L-spine No Charge  Result Date: 10/02/2016 CLINICAL DATA:  70 year old male with back pain x5 weeks. EXAM: CT LUMBAR SPINE WITHOUT CONTRAST TECHNIQUE: Multidetector CT imaging of the lumbar spine was performed without intravenous contrast administration. Multiplanar CT image reconstructions were also generated. COMPARISON:  Abdominal CT dated 10/02/2016 and 11/03/2010 FINDINGS: Segmentation: 5 lumbar type vertebrae. Alignment: Normal. Vertebrae: There is advanced osteopenia suspicious for diffuse lytic metastatic  process or underlying marrow abnormality or myeloma. Correlation with clinical exam and further evaluation with MRI or bone scan is recommended. Multilevel age indeterminate compression deformity involving T10, T11, T12, L3 and L4 noted. There is approximately 25% loss of vertebral body height at L4. An acute fracture involving L4 is not excluded. Evaluation is very limited due to advanced osteopenia. Correlation with clinical exam and point tenderness and further evaluation with MRI if clinically indicated recommended. No retropulsed fragment identified. There is endplate irregularity and Schmorl node involving the superior endplate of the L5. A lytic lesion with cortical breakthrough noted in the right iliac bone. Paraspinal and other soft tissues: Bilateral renal stones, aortoiliac atherosclerotic disease, and small ascites as seen on the CT. The paraspinal soft tissues appear unremarkable. Partially visualized right pleural effusion. Disc levels: Multilevel disc disease with endplate irregularity. IMPRESSION: Advanced osseous demineralization may be related to diffuse lytic metastatic disease or myeloma. Correlation with clinical exam and further evaluation with MRI or bone scan recommended. Evaluation for acute fracture is very limited due to osteopenia. Multilevel age indeterminate compression deformity most prominent at L4. An acute fracture involving the L4 vertebra is not entirely excluded. Correlation with point tenderness recommended. No retropulsed fragment. Lytic lesion with aggressive features in the right iliac bone concerning for malignancy. Further evaluation with MRI or bone scan recommended. Partially visualized right pleural effusion. Electronically Signed   By: Anner Crete M.D.   On: 10/02/2016 03:41   Ct Biopsy  Result Date: 10/03/2016 INDICATION: 70 year old male with a history of possible multiple myeloma. EXAM: CT BIOPSY MEDICATIONS: None.  ANESTHESIA/SEDATION: Moderate (conscious)  sedation was employed during this procedure. A total of Versed 1.5 mg and Fentanyl 50 mcg was administered intravenously. Moderate Sedation Time: 21 minutes. The patient's level of consciousness and vital signs were monitored continuously by radiology nursing throughout the procedure under my direct supervision. FLUOROSCOPY TIME:  CT COMPLICATIONS: None PROCEDURE: The procedure risks, benefits, and alternatives were explained to the patient. Questions regarding the procedure were encouraged and answered. The patient understands and consents to the procedure. Patient was attempted to position into the prone position. He was unable to comfortably achieve this. When he was turning himself into a supine position, he experienced left-sided chest pain. Scout CT of the pelvis was performed for surgical planning purposes. The anterior pelvis was prepped with Betadinein a sterile fashion, and a sterile drape was applied covering the operative field. A sterile gown and sterile gloves were used for the procedure. Local anesthesia was provided with 1% Lidocaine. We targeted the right anterior iliac bone for biopsy. The skin and subcutaneous tissues were infiltrated with 1% lidocaine without epinephrine. A small stab incision was made with an 11 blade scalpel, and an 11 gauge Murphy needle was advanced with CT guidance to the posterior cortex. Manual forced was used to advance the needle through the posterior cortex and the stylet was removed. A bone marrow aspirate was retrieved and passed to a cytotechnologist in the room. The Murphy needle was then advanced without the stylet for a core biopsy. The core biopsy was retrieved and also passed to a cytotechnologist. Manual pressure was used for hemostasis and a sterile dressing was placed. No complications were encountered no significant blood loss was encountered. Patient tolerated the procedure well and remained hemodynamically stable throughout. IMPRESSION: Status post  CT-guided bone marrow biopsy, with tissue specimen sent to pathology for complete histopathologic analysis Signed, Dulcy Fanny. Earleen Newport, DO [facility] PLAN: Given the patient's acute discomfort during repositioning on the table, a chest x-ray will be acquired to investigate for a possible left-sided rib fracture. Electronically Signed   By: Corrie Mckusick D.O.   On: 10/03/2016 11:24   Dg Chest Port 1 View  Result Date: 10/05/2016 CLINICAL DATA:  Hypoxia EXAM: PORTABLE CHEST 1 VIEW COMPARISON:  10/04/2016 FINDINGS: Cardiomegaly with vascular congestion. Improving interstitial opacities, likely improving interstitial edema. Mild edema persists. Small right pleural effusion noted. IMPRESSION: Mild CHF, slightly improved since prior study. Electronically Signed   By: Rolm Baptise M.D.   On: 10/05/2016 07:42   Dg Chest Port 1 View  Result Date: 10/04/2016 CLINICAL DATA:  Fever EXAM: PORTABLE CHEST 1 VIEW COMPARISON:  10/03/2016 FINDINGS: Cardiomegaly with vascular congestion. Diffuse interstitial prominence throughout the lungs. More focal airspace opacity throughout the right lung with probable small right effusion. IMPRESSION: Cardiomegaly, possible mild interstitial edema. Focal opacities in the right could reflect pneumonia. Small right effusion. Electronically Signed   By: Rolm Baptise M.D.   On: 10/04/2016 08:19   Dg Chest Port 1 View  Result Date: 10/02/2016 CLINICAL DATA:  70 y/o  M; shortness of breath. EXAM: PORTABLE CHEST 1 VIEW COMPARISON:  11/03/2010 chest radiograph. FINDINGS: Cardiomegaly appears increased in comparison with prior radiographs given projection and technique. Prominent interstitial markings. Right basilar opacity obscures the right heart border. No definite pleural effusion. Bones are unremarkable. IMPRESSION: Increased cardiomegaly from prior radiographs may represent heart failure or pericardial effusion. Increased  interstitial markings and opacity partially obscuring the right heart border probably represents pulmonary edema. Pneumonia  is not excluded. Electronically Signed   By: Kristine Garbe M.D.   On: 10/02/2016 00:21   Dg Bone Survey Met  Result Date: 10/02/2016 CLINICAL DATA:  Low back pain for months, metastatic bone survey. Followup lytic lesion RIGHT iliac bone. Pending bone marrow biopsy for suspected multiple myeloma. EXAM: METASTATIC BONE SURVEY COMPARISON:  CT abdomen and pelvis/lumbar spine October 02, 2016 and CT chest November 03, 2010 FINDINGS: Skull:  No destructive bony lesions. RIGHT upper extremity: Small lytic lesion RIGHT acromion, not included on on prior chest CT. Widened scapholunate interval most compatible with old ligamentous injury associated with severe radiocarpal osteoarthrosis. Tiny lucency in the triquetrum bone. Moderate vascular calcifications. LEFT upper extremity: Surgical screw proximal LEFT humerus. No destructive bony lesions. Moderate vascular calcifications. Cervical spine: Advanced degenerative change without destructive bony lesions. Symmetric vascular calcifications in the neck. Thoracic spine: Moderate chronic lower thoracic compression fractures. No destructive bony lesions. Lumbar spine: Old L4 mild to moderate compression fracture, mild L3 old compression fracture. No destructive bony lesions. Pelvis: No acute osseous process. Lytic lesion RIGHT iliac bone corresponding to today's CT finding. RIGHT lower extremity: No destructive bony lesions or acute osseous process. LEFT lower extremity: Small lytic lesion proximal LEFT femur diaphysis involving the intramedullary cavity and cortex. No acute osseous process. Chest: Cardiomegaly. Pulmonary vascular congestion. RIGHT pleural effusion better seen on today's CT abdomen and pelvis. Generalized osteopenia. IMPRESSION: Lytic lesion RIGHT iliac bone corresponding to today's CT finding. Additional small lytic lesions  RIGHT acromion, LEFT femur, RIGHT carpal bones, concerning for metastatic disease in the setting of suspected neoplasm. Osteopenia. Electronically Signed   By: Elon Alas M.D.   On: 10/02/2016 22:01    ASSESSMENT & PLAN:   70 year old male with multiple medical co-morbidities including hypertension, diabetes, dyslipidemia, coronary artery disease status post drug-eluting PCI on 07/31/2016 and newly noted possibly ischemic cardiomyopathy ejection fraction 25-35% admitted with.  1) multiple myeloma with  Lytic lesion with aggressive features in the right iliac bone and possibly other lytic lesions in the L spine , anemia, hypercalcemia and renal insuff prelim bone marrow bx -- shows 67% plasma cells consistent with multiple myeloma. (Discussed with Dr. Tresa Moore) K/L 95, No M spike on SPEP -- suggests light chain MM 2) severe hypercalcemia calcium a more than 15 now down to normal 9.4 This is likely due to MM  3) CAD s/p prox LAD DES PCI on 07/31/2016 with ischemic cardiomyopathy ejection fraction of 25-35%. Rpt ECHO on 10/03/2016 shows improvement in EF to 55-60% with no RWMABN.  4) Macrocytic Anemia with moderate thrombocytopenia  5) AKI on CKD -- related to hypercalcemia vs ?cardiorenal syndrome vs MM - improved.  6) B12 deficiency - [place] B12 daily x 7 doses then SL B12 1063mg po daily on discharge.  PLAN -prelim BM Bx results consistent with Myeloma - discussed in detail with the patient's wife at bedside and answered all her questions. -would start patient on Dexamethasone 442mpo weekly from today- with eye on fluid overload, hyperglycemia and confusion -will plan to treatment with Velcade /Dexamethasone and if he tolerates will change to CyBorD -will need monthly IV Pamidronate -Transfuse when necessary to keep the hemoglobin close to/above 9  given his significant cardiac comorbidities. -will replace B12 1000 mcg Tate daily while in the hospital up to 7 days then switched to  thousand micrograms sublingually daily.  -Will need PT OT evaluation and possible SNF placement -We'll follow up on final bone marrow biopsy results -Further management  as per hospital medicine Dr including coumadin mx   Follow-up with Dr. Irene Limbo in clinic in 7-10 days of discharge with labs for further treatment of multiple myeloma  I spent 30 minutes counseling the patient face to face. The total time spent in the appointment was 40 minutes and more than 50% was on counseling and direct patient cares.    Sullivan Lone MD August AAHIVMS [facility] Hematology/Oncology Physician [facility]  (Office):       [phone] (Work cell):  [phone] (Fax):           [phone]  10/06/2016 3:36 PM

## 2016-10-06 NOTE — Care Management Important Message (Signed)
Important Message  Patient Details  Name: Richard Vang MRN: [id number] Date of Birth: [birth year]   Medicare Important Message Given:  Yes    Nathen May 10/06/2016, 10:53 AM

## 2016-10-06 NOTE — Progress Notes (Signed)
ANTICOAGULATION CONSULT NOTE - Follow Up Consult  Pharmacy Consult for Coumadin Indication: atrial fibrillation  No Known Allergies  Patient Measurements: Height: 5\' 10"  (177.8 cm) Weight: 223 lb 8.7 oz (101.4 kg) IBW/kg (Calculated) : 73  Vital Signs: Temp: 97.5 F (36.4 C) (12/18 1219) Temp Source: Oral (12/18 1219) BP: 108/95 (12/18 1219) Pulse Rate: 87 (12/18 1219)  Labs:  Recent Labs  10/04/16 0256 10/05/16 0634 10/06/16 0553  HGB 8.7* 8.6* 8.8*  HCT 28.4* 29.1* 29.4*  PLT 92* 78* 73*  LABPROT 45.6* 39.6* 37.1*  INR 4.71* 3.95 3.64  CREATININE 2.32* 2.42* 1.97*    Estimated Creatinine Clearance: 42.2 mL/min (by C-G formula based on SCr of 1.97 mg/dL (H)).  Assessment: 68y yoM on Coumadin PTA for afib - last dose 12/13. C/O lower back pain x 6 weeks, CT spine showed multiple lytic lesions concerning for metastatic disease. Original plan was to transition to heparin for possible biopsy of the lesions, but IR decided to hold off. Oncology consulted who decided to perform biopsy 12/15 despite INR 4.8. INR remains supratherapeutic at 3.64, but trending down.  Also no Plavix as prior to admission.  Hgb low stable, platelet count trended down to 73K.  No bleeding reported. Not much PO intake, which is likely contributing to slow INR declins.   Day # 3 Levaquin, could also be effecting INR decline.  30K/ml Enterococcus in urine.   PTA Coumadin dose: 6mg  daily. Last dose 10/01/16 (pta)  Goal of Therapy:  INR 2-3 Monitor platelets by anticoagulation protocol: Yes   Plan:   No Coumadin again today.  Daily PT/INR.  Continue Levaquin 750 mg IV q48hrs.  Follow renal function for any need to adjust Levaquin dosing interval.  Arty Baumgartner, [place] Pager: [phone] 10/06/2016

## 2016-10-07 ENCOUNTER — Encounter: Payer: Self-pay | Admitting: Hematology

## 2016-10-07 ENCOUNTER — Telehealth: Payer: Self-pay | Admitting: Hematology

## 2016-10-07 DIAGNOSIS — I509 Heart failure, unspecified: Secondary | ICD-10-CM

## 2016-10-07 LAB — COMPREHENSIVE METABOLIC PANEL
ALBUMIN: 3.4 g/dL — AB (ref 3.5–5.0)
ALT: 21 U/L (ref 17–63)
ANION GAP: 7 (ref 5–15)
AST: 51 U/L — ABNORMAL HIGH (ref 15–41)
Alkaline Phosphatase: 128 U/L — ABNORMAL HIGH (ref 38–126)
BILIRUBIN TOTAL: 1.3 mg/dL — AB (ref 0.3–1.2)
BUN: 42 mg/dL — ABNORMAL HIGH (ref 6–20)
CO2: 28 mmol/L (ref 22–32)
Calcium: 9.1 mg/dL (ref 8.9–10.3)
Chloride: 113 mmol/L — ABNORMAL HIGH (ref 101–111)
Creatinine, Ser: 1.8 mg/dL — ABNORMAL HIGH (ref 0.61–1.24)
GFR, EST AFRICAN AMERICAN: 43 mL/min — AB (ref >60)
GFR, EST NON AFRICAN AMERICAN: 37 mL/min — AB (ref >60)
GLUCOSE: 116 mg/dL — AB (ref 65–99)
POTASSIUM: 3.3 mmol/L — AB (ref 3.5–5.1)
Sodium: 148 mmol/L — ABNORMAL HIGH (ref 135–145)
TOTAL PROTEIN: 5.4 g/dL — AB (ref 6.5–8.1)

## 2016-10-07 LAB — PROTEIN ELECTROPHORESIS, SERUM
A/G RATIO SPE: 1.4 (ref 0.7–1.7)
ALBUMIN ELP: 3.2 g/dL (ref 2.9–4.4)
Alpha-1-Globulin: 0.3 g/dL (ref 0.0–0.4)
Alpha-2-Globulin: 0.6 g/dL (ref 0.4–1.0)
Beta Globulin: 1 g/dL (ref 0.7–1.3)
GLOBULIN, TOTAL: 2.3 g/dL (ref 2.2–3.9)
Gamma Globulin: 0.5 g/dL (ref 0.4–1.8)
TOTAL PROTEIN ELP: 5.5 g/dL — AB (ref 6.0–8.5)

## 2016-10-07 LAB — CBC
HCT: 30.7 % — ABNORMAL LOW (ref 39.0–52.0)
Hemoglobin: 9.4 g/dL — ABNORMAL LOW (ref 13.0–17.0)
MCH: 30.9 pg (ref 26.0–34.0)
MCHC: 30.6 g/dL (ref 30.0–36.0)
MCV: 101 fL — ABNORMAL HIGH (ref 78.0–100.0)
PLATELETS: 85 10*3/uL — AB (ref 150–400)
RBC: 3.04 MIL/uL — AB (ref 4.22–5.81)
RDW: 19 % — AB (ref 11.5–15.5)
WBC: 3 10*3/uL — AB (ref 4.0–10.5)

## 2016-10-07 LAB — GLUCOSE, CAPILLARY
GLUCOSE-CAPILLARY: 104 mg/dL — AB (ref 65–99)
GLUCOSE-CAPILLARY: 107 mg/dL — AB (ref 65–99)
GLUCOSE-CAPILLARY: 139 mg/dL — AB (ref 65–99)
Glucose-Capillary: 134 mg/dL — ABNORMAL HIGH (ref 65–99)

## 2016-10-07 LAB — PROTIME-INR
INR: 3.6
PROTHROMBIN TIME: 36.8 s — AB (ref 11.4–15.2)

## 2016-10-07 MED ORDER — POTASSIUM CHLORIDE CRYS ER 20 MEQ PO TBCR
40.0000 meq | EXTENDED_RELEASE_TABLET | Freq: Once | ORAL | Status: AC
  Administered 2016-10-07: 40 meq via ORAL
  Filled 2016-10-07: qty 2

## 2016-10-07 MED ORDER — KCL IN DEXTROSE-NACL 20-5-0.2 MEQ/L-%-% IV SOLN
INTRAVENOUS | Status: DC
  Administered 2016-10-07: 12:00:00 via INTRAVENOUS
  Filled 2016-10-07 (×2): qty 1000

## 2016-10-07 MED ORDER — POTASSIUM CHLORIDE 20 MEQ/15ML (10%) PO SOLN
ORAL | Status: AC
  Filled 2016-10-07: qty 30

## 2016-10-07 NOTE — Progress Notes (Signed)
ANTICOAGULATION CONSULT NOTE - Follow Up Consult  Pharmacy Consult for Coumadin Indication: atrial fibrillation  No Known Allergies  Patient Measurements: Height: 5\' 10"  (177.8 cm) Weight: 226 lb 13.7 oz (102.9 kg) IBW/kg (Calculated) : 73  Vital Signs: Temp: 97.5 F (36.4 C) (12/19 1423) Temp Source: Oral (12/19 1423) BP: 112/63 (12/19 1423) Pulse Rate: 60 (12/19 1423)  Labs:  Recent Labs  10/05/16 0634 10/06/16 0553 10/07/16 0441  HGB 8.6* 8.8* 9.4*  HCT 29.1* 29.4* 30.7*  PLT 78* 73* 85*  LABPROT 39.6* 37.1* 36.8*  INR 3.95 3.64 3.60  CREATININE 2.42* 1.97* 1.80*    Estimated Creatinine Clearance: 46.6 mL/min (by C-G formula based on SCr of 1.8 mg/dL (H)).  Assessment: 69y yoM on Coumadin PTA for afib - last dose 12/13. C/O lower back pain x 6 weeks, CT spine showed multiple lytic lesions concerning for metastatic disease. Original plan was to transition to heparin for possible biopsy of the lesions, but IR decided to hold off. Oncology consulted who decided to perform biopsy 12/15 despite INR 4.8. INR remains supratherapeutic at 3.6.  Also no Plavix as prior to admission.  Hgb low stable, platelet count trended down to 73K.  No bleeding reported. Not much PO intake, which is likely contributing to slow INR declins.   Day # 4 Levaquin, could also be effecting INR decline.  30K/ml Enterococcus in urine.   PTA Coumadin dose: 6mg  daily. Last dose 10/01/16 (pta)  Goal of Therapy:  INR 2-3 Monitor platelets by anticoagulation protocol: Yes   Plan:   No Coumadin again today.  Daily PT/INR.  Continue Levaquin 750 mg IV q48hrs.  Follow renal function for any need to adjust Levaquin dosing interval.  Maryanna Shape, PharmD, BCPS  Clinical Pharmacist  Pager: [phone]   10/07/2016

## 2016-10-07 NOTE — Progress Notes (Signed)
CSW met patient at bedside to offer support and discuss patients needs at discharge. Patient and his wife decided on [facility] as their second options. Patient aware that CSW will have to fax information to insurance company to get approval before SNF placement can go though. CSW faxed over information to [facility] and is awaiting approval number. CSW will keep family informed of any updates. CSW remains available for support and discharge needs.   Rhea Pink, MSW,  [place]

## 2016-10-07 NOTE — Progress Notes (Signed)
Nutrition Follow-up  DOCUMENTATION CODES:   Obesity unspecified  INTERVENTION:    Add Mighty Shake II with meals, each supplement provides 480-500 kcals and 20-23 grams of protein  Add Magic cup with meals, each supplement provides 290 kcal and 9 grams of protein  NUTRITION DIAGNOSIS:   Increased nutrient needs related to  (acute on chronic illness) as evidenced by estimated needs.  Ongoing  GOAL:   Patient will meet greater than or equal to 90% of their needs  Unmet  MONITOR:   PO intake, Supplement acceptance, Labs, Weight trends, I & O's  ASSESSMENT:   70 y.o. male with history of CAD status post stenting recently in October, atrial fibrillation, cardiomyopathy, diabetes mellitus has been experiencing low back pain over the last 6 weeks. Patient had followed up with his PCP yesterday and was referred to cardiologist because of shortness of breath and patient was eventually referred to the ER because of concerns for possible PE. Patient was complaining of increasing low back pain over the last 6 weeks with even at rest. Denies any incontinence of urine or bowels. Lab work in the ER show hypercalcemia with calcium more than 14.   Positive diagnosis of multiple myeloma. Oncology following. Patient continues to have very poor oral intake, consuming 0-15% of meals. He is consuming very little Ensure. Labs reviewed: elevated sodium, low potassium Medications reviewed and include vitamin B 12, Miralax.  Diet Order:  Diet Heart Room service appropriate? Yes; Fluid consistency: Thin  Skin:  Reviewed, no issues  Last BM:  12/10  Height:   Ht Readings from Last 1 Encounters:  10/04/16 _0  (1.778 m)    Weight:   Wt Readings from Last 1 Encounters:  10/07/16 226 lb 13.7 oz (102.9 kg)    Ideal Body Weight:  75.4 kg  BMI:  Body mass index is 32.55 kg/m.  Estimated Nutritional Needs:   Kcal:  1900-2100  Protein:  90-100 gm  Fluid:  1.9-2.1 L  EDUCATION  NEEDS:   No education needs identified at this time  Molli Barrows, [place], [place], [place] Pager [phone] After Hours Pager [phone]

## 2016-10-07 NOTE — Progress Notes (Addendum)
PROGRESS NOTE    Richard Vang  GTX:[id number] DOB: Aug 29, 1946 DOA: 10/01/2016  PCP: Sandi Mariscal, MD   Brief Narrative:  Richard Vang is a 70 y.o. male with history of CAD status post stenting recently in October, atrial fibrillation, cardiomyopathy, diabetes mellitus has been experiencing low back pain over the last 6 weeks. Patient had followed up with his PCP yesterday and was referred to cardiologist because of shortness of breath and patient was eventually referred to the ER because of concerns for possible PE. Patient was complaining of increasing low back pain over the last 6 weeks with even at rest. Denies any incontinence of urine or bowels. Lab work in the ER show hypercalcemia with calcium more than 14. CT scan of the lumbar spine and abdomen shows lytic lesions concerning for multiple myeloma. Patient states he is getting short of breath mostly because of the low back pain. Denies any chest pain. EKG shows A. fib with PVCs.  Now has confirmed mulitple myeloma,  status post bone marrow biopsy,  after having biopsy he became febrile hypoxic and hypotensive- transferred to SDU- ? Aspiration.    Subjective: Sat up on side of bed  and back started hurting again, still has DOE    Assessment & Plan:  Hypercalcemia,  lytic lesions in the CT scan concerning for multiple myeloma  prelim bone marrow bx -- shows 67% plasma cells consistent with multiple myeloma Hypercalcemia + and bone lytic lesions -  oncology consulted by Debbe Odea, MD, see note by Brunetta Genera, MD 12/14 - underwent bone marrow biopsy 12/15 - Ca > 15- aggressively treating - Lasix , Pamidronate, Calcitonin s/c for 48 hrs - down to  9.4 today - skeletal survey:  Lytic lesion RIGHT iliac bone, RIGHT acromion, LEFT femur, RIGHT carpal bones   - urine immunofixation>> bence jones protein + - increased kappa light chains on protein electrophoresis PSA 2.9 Patient started on Decadron 40 mg a week, further treatment  as per oncology, monthly IV pamidronate.  Will need SNF placement  AKI on CKD 3, baseline around 1.3-1.5 - may be related to MM as above- slow rise may be due to Lasix and now poor oral intake Creatinine peaked at 2.42, now 1.8   Hypernatremia , improving - sodium steadily increasing- Lasix stopped 2 days ago and not eating well now Encourage by mouth intake Continue D5 0.2% saline  Pleural effusion  - on admission- improved with a few days of Lasix- ? Due to CHF  Now on ivf for aki  Fever with hypoxia and hypotension secondary to pneumonia - 12/15 -  received 1 L NS and Levaquin - CXR suggestive of possible RLL infiltrate- ? Aspiration - was sedated and then confused and agitated possibly from medications given for biopsy Fever has resolved Blood culture no growth so far Continue antibiotics for a total of 7 days for possible aspiration pneumonia, stopped 12/22    On and off confusion/ Low back pain  Likely secondary to lytic lesions from multiple myeloma Continue MS Contin and short-acting morphine sulfate as needed    Anemia, borderline low WBC count and thrombocytopenia - Hb 15.3 in 1/12- 11 on admission- now 9.4. Transfuse for hemoglobin less than 9 - likely due to MM (if that is indeed the diagnosis)  Transfuse when necessary to keep the hemoglobin close to/above 9  given his significant cardiac comorbidities as per hematology     Component Value Date/Time   IRON 59 10/02/2016 1640  TIBC 479 (H) 10/02/2016 1640   FERRITIN 101 10/02/2016 1640   IRONPCTSAT 12 (L) 10/02/2016 1640      Essential hypertension Coreg- dose decreased from 18.75 to 6.25 due to hypotension    CAD S/P percutaneous coronary angioplasty with drug eluting stent in LAD  -- no longer on Aspirin at home - cont Plavix- stent was placed in Nov of last year - statin, Coreg  Acute on chronic sCHF- ischemic cardiomyopathy - EF ~25 % by LV Gram 10/17- no ECHO in system- obtained ECHO to assess EF-  EF now 55-60% but likely has diastolic CHF althou not directly mentioned on ECHO- also has mod pulm , tricuspid, aortic regurg with dilated RV but normal RV function - mod L effusion, pedal edema and mild anasarca (on CT) on admission - holding Lasix due to hypotension occurring with fever - no ACE at this time due to AKI- on Lisinopril at home   Hypokalemia Replete   Mildly elevated TN I - no chest pain- flat trend- due to AKI vs fluid overload  Mildly elevated d dimer -not hypoxic-  may be due to underlying cancer- already on Coumadin    Chronic atrial fibrillation  - CHA2DS2-VASc Score 4 - coreg (dose decreased due to hypotension), Coumadin- rate controlled Pharmacy managing   anticoagulation  Cirrhosis  - h/o Hep C  DM2 - holding Metformin due to renal failure- SSI for now   DVT prophylaxis: Coumadin Code Status: Full code Family Communication: wife Disposition Plan: Transfer to telemetry- follow pulse ox, mental status and back pain- suspect he may need SNF  Consultants:    Procedures:    Antimicrobials:  Anti-infectives    Start     Dose/Rate Route Frequency Ordered Stop   10/06/16 0600  levofloxacin (LEVAQUIN) IVPB 750 mg     750 mg 100 mL/hr over 90 Minutes Intravenous Every 48 hours 10/04/16 0251     10/04/16 0300  levofloxacin (LEVAQUIN) IVPB 750 mg     750 mg 100 mL/hr over 90 Minutes Intravenous NOW 10/04/16 0251 10/04/16 0525       Objective: Vitals:   10/07/16 0400 10/07/16 0500 10/07/16 0642 10/07/16 0700  BP:   120/85 112/78  Pulse: 71  64 78  Resp: (!) 21   19  Temp:    98.4 F (36.9 C)  TempSrc:    Oral  SpO2: 96%   (!) 83%  Weight:  102.9 kg (226 lb 13.7 oz)    Height:        Intake/Output Summary (Last 24 hours) at 10/07/16 0843 Last data filed at 10/07/16 0700  Gross per 24 hour  Intake           516.25 ml  Output             1650 ml  Net         -1133.75 ml   Filed Weights   10/05/16 0331 10/06/16 0500 10/07/16 0500    Weight: 103.1 kg (227 lb 4.7 oz) 101.4 kg (223 lb 8.7 oz) 102.9 kg (226 lb 13.7 oz)    Examination: General exam: Appears comfortable but quite sleepy HEENT: PERRLA, oral mucosa moist, no sclera icterus or thrush Respiratory system: Clear to auscultation. Respiratory effort normal. Cardiovascular system: S1 & S2 heard, RRR.  No murmurs  Gastrointestinal system: Abdomen soft, non-tender, nondistended. Normal bowel sound. No organomegaly Central nervous system: Alert and oriented. No focal neurological deficits. Extremities: No cyanosis, clubbing -  pedal edema improving Skin:  No rashes or ulcers Psychiatry:  Mood & affect appropriate.     Data Reviewed: I have personally reviewed following labs and imaging studies  CBC:  Recent Labs Lab 10/03/16 0755 10/03/16 1912 10/04/16 0256 10/05/16 0634 10/06/16 0553 10/07/16 0441  WBC 4.4  --  2.5* 2.2* 2.1* 3.0*  HGB 8.3* 8.6* 8.7* 8.6* 8.8* 9.4*  HCT 27.4* 28.1* 28.4* 29.1* 29.4* 30.7*  MCV 101.1*  --  101.1* 102.1* 103.2* 101.0*  PLT 106*  --  92* 78* 73* 85*   Basic Metabolic Panel:  Recent Labs Lab 10/02/16 0326  10/03/16 0156 10/04/16 0256 10/05/16 0634 10/05/16 0818 10/06/16 0553 10/07/16 0441  NA 143  < > 144 149* 156*  --  154* 148*  K 3.1*  < > 3.5 3.3* 4.0  --  3.5 3.3*  CL 104  < > 107 110 119*  --  117* 113*  CO2 25  < > '29 27 28  ' --  28 28  GLUCOSE 93  < > 120* 120* 104*  --  100* 116*  BUN 46*  < > 46* 48* 57*  --  50* 42*  CREATININE 2.07*  < > 2.15* 2.32* 2.42*  --  1.97* 1.80*  CALCIUM 14.4*  14.4*  < > 13.0* 11.8* 10.8* 10.7* 9.4 9.1  MG  --   --  2.5*  --   --   --   --   --   PHOS 4.6  --   --   --   --   --   --   --   < > = values in this interval not displayed. GFR: Estimated Creatinine Clearance: 46.6 mL/min (by C-G formula based on SCr of 1.8 mg/dL (H)). Liver Function Tests:  Recent Labs Lab 10/02/16 1640 10/05/16 0818 10/07/16 0441  AST 35  --  51*  ALT 17  --  21  ALKPHOS 110  --   128*  BILITOT 1.3*  --  1.3*  PROT 5.9*  --  5.4*  ALBUMIN 4.1 3.8 3.4*   No results for input(s): LIPASE, AMYLASE in the last 168 hours. No results for input(s): AMMONIA in the last 168 hours. Coagulation Profile:  Recent Labs Lab 10/03/16 0156 10/04/16 0256 10/05/16 0634 10/06/16 0553 10/07/16 0441  INR 4.80* 4.71* 3.95 3.64 3.60   Cardiac Enzymes:  Recent Labs Lab 10/02/16 0754 10/02/16 1321 10/02/16 1816  TROPONINI 0.05* 0.06* 0.06*   BNP (last 3 results) No results for input(s): PROBNP in the last 8760 hours. HbA1C: No results for input(s): HGBA1C in the last 72 hours. CBG:  Recent Labs Lab 10/06/16 0917 10/06/16 1221 10/06/16 1612 10/06/16 2103 10/07/16 0808  GLUCAP 103* 117* 109* 131* 104*   Lipid Profile: No results for input(s): CHOL, HDL, LDLCALC, TRIG, CHOLHDL, LDLDIRECT in the last 72 hours. Thyroid Function Tests: No results for input(s): TSH, T4TOTAL, FREET4, T3FREE, THYROIDAB in the last 72 hours. Anemia Panel: No results for input(s): VITAMINB12, FOLATE, FERRITIN, TIBC, IRON, RETICCTPCT in the last 72 hours. Urine analysis:    Component Value Date/Time   COLORURINE YELLOW 10/04/2016 0313   APPEARANCEUR CLEAR 10/04/2016 0313   LABSPEC 1.011 10/04/2016 0313   PHURINE 6.0 10/04/2016 0313   GLUCOSEU NEGATIVE 10/04/2016 0313   HGBUR MODERATE (A) 10/04/2016 0313   BILIRUBINUR NEGATIVE 10/04/2016 0313   KETONESUR NEGATIVE 10/04/2016 0313   PROTEINUR NEGATIVE 10/04/2016 0313   UROBILINOGEN 0.2 11/03/2010 1649   NITRITE NEGATIVE 10/04/2016 0313   LEUKOCYTESUR TRACE (A)  10/04/2016 0313   Sepsis Labs: '@LABRCNTIP' (procalcitonin:4,lacticidven:4) ) Recent Results (from the past 240 hour(s))  Culture, Urine     Status: Abnormal   Collection Time: 10/04/16  2:42 AM  Result Value Ref Range Status   Specimen Description URINE, RANDOM  Final   Special Requests NONE  Final   Culture 30,000 COLONIES/mL ENTEROCOCCUS FAECALIS (A)  Final   Report  Status 10/06/2016 FINAL  Final   Organism ID, Bacteria ENTEROCOCCUS FAECALIS (A)  Final      Susceptibility   Enterococcus faecalis - MIC*    AMPICILLIN <=2 SENSITIVE Sensitive     LEVOFLOXACIN 1 SENSITIVE Sensitive     NITROFURANTOIN <=16 SENSITIVE Sensitive     VANCOMYCIN 1 SENSITIVE Sensitive     * 30,000 COLONIES/mL ENTEROCOCCUS FAECALIS  Culture, blood (Routine X 2) w Reflex to ID Panel     Status: None (Preliminary result)   Collection Time: 10/04/16  2:56 AM  Result Value Ref Range Status   Specimen Description BLOOD RIGHT HAND  Final   Special Requests IN PEDIATRIC BOTTLE 2CC  Final   Culture NO GROWTH 2 DAYS  Final   Report Status PENDING  Incomplete  Culture, blood (Routine X 2) w Reflex to ID Panel     Status: None (Preliminary result)   Collection Time: 10/04/16  3:02 AM  Result Value Ref Range Status   Specimen Description BLOOD LEFT ANTECUBITAL  Final   Special Requests BOTTLES DRAWN AEROBIC AND ANAEROBIC 5CC  Final   Culture NO GROWTH 2 DAYS  Final   Report Status PENDING  Incomplete  MRSA PCR Screening     Status: None   Collection Time: 10/04/16  8:38 AM  Result Value Ref Range Status   MRSA by PCR NEGATIVE NEGATIVE Final    Comment:        The GeneXpert MRSA Assay (FDA approved for NASAL specimens only), is one component of a comprehensive MRSA colonization surveillance program. It is not intended to diagnose MRSA infection nor to guide or monitor treatment for MRSA infections.          Radiology Studies: No results found.    Scheduled Meds: . atorvastatin  40 mg Oral QHS  . carvedilol  6.25 mg Oral BID WC  . clopidogrel  75 mg Oral Daily  . cyanocobalamin  1,000 mcg Subcutaneous Daily  . feeding supplement (ENSURE ENLIVE)  237 mL Oral BID BM  . insulin aspart  0-9 Units Subcutaneous TID WC  . levofloxacin (LEVAQUIN) IV  750 mg Intravenous Q48H  . morphine  15 mg Oral Q12H  . polyethylene glycol  17 g Oral Daily  . potassium chloride  40  mEq Oral Once  . sodium chloride  250 mL Intravenous Once  . Warfarin - Pharmacist Dosing Inpatient   Does not apply q1800   Continuous Infusions: . sodium chloride Stopped (10/03/16 1716)  . dextrose 5 % and 0.2 % NaCl with KCl 20 mEq       LOS: 5 days    Time spent in minutes: 61    Ceejay Kegley, MD [facility] Pager: [email or web address] Password [facility] 10/07/2016, 8:43 AM

## 2016-10-07 NOTE — NC FL2 (Signed)
[place] MEDICAID FL2 LEVEL OF CARE SCREENING TOOL     IDENTIFICATION  Patient Name: Richard Vang Birthdate: [birth year] Sex: male Admission Date (Current Location): 10/01/2016  [facility] and [place] Number:  Herbalist and Address:  [facility] [place]. [facility], [place] [address], [place], [place] [address]      Provider Number: [id number]  Attending Physician Name and Address:  Reyne Dumas, MD  Relative Name and Phone Number:       Current Level of Care: Hospital Recommended Level of Care: [place] Prior Approval Number:    Date Approved/Denied:   PASRR Number: [id number]  Discharge Plan: SNF    Current Diagnoses: Patient Active Problem List   Diagnosis Date Noted  . Multiple myeloma without remission ([place])   . B12 deficiency   . Lytic bone lesions on xray   . Anemia   . Hypercalcemia 10/02/2016  . Thrombocytopenia ([place]) 10/02/2016  . Low back pain 10/02/2016  . AKI (acute kidney injury) ([place])   . Acute congestive heart failure ([place])   . Bone mass   . CAD S/P percutaneous coronary angioplasty 07/31/2016  . DOE (dyspnea on exertion)   . Essential hypertension 07/18/2016  . Hyperlipidemia 07/18/2016  . Diabetes ([place]) 07/18/2016  . Chronic atrial fibrillation ([place]) 07/18/2016  . Current use of long term anticoagulation 07/18/2016  . Dyspnea on exertion 07/18/2016  . Cardiomyopathy, ischemic: EF ~25 % by LV Gram 07/18/2016  . Abnormal nuclear stress test 07/18/2016    Orientation RESPIRATION BLADDER Height & Weight     Self, Time, Situation, Place  Normal Incontinent, External catheter Weight: 226 lb 13.7 oz (102.9 kg) Height:  _0  (177.8 cm)  BEHAVIORAL SYMPTOMS/MOOD NEUROLOGICAL BOWEL NUTRITION STATUS   (None)  (None) Continent Diet (Heart healthy)  AMBULATORY STATUS COMMUNICATION OF NEEDS Skin   Extensive Assist Verbally Bruising, Other (Comment) ([place])                       Personal Care Assistance  Level of Assistance  Bathing, Feeding, Dressing Bathing Assistance: Limited assistance Feeding assistance: Independent Dressing Assistance: Limited assistance     Functional Limitations Info  Sight, Hearing, Speech Sight Info: Adequate Hearing Info: Adequate Speech Info: Adequate    SPECIAL CARE FACTORS FREQUENCY  PT (By licensed PT), Blood pressure, Diabetic urine testing, OT (By licensed OT)     PT Frequency: 5 x week OT Frequency: 5 x week            Contractures Contractures Info: Not present    Additional Factors Info  Code Status, Allergies Code Status Info: Full Allergies Info: NKDA           Current Medications (10/07/2016):  This is the current hospital active medication list Current Facility-Administered Medications  Medication Dose Route Frequency Provider Last Rate Last Dose  . 0.9 %  sodium chloride infusion   Intravenous Continuous PRN Corrie Mckusick, DO   Stopped at 10/03/16 1716  . acetaminophen (TYLENOL) tablet 650 mg  650 mg Oral Q6H PRN Rise Patience, MD       Or  . acetaminophen (TYLENOL) suppository 650 mg  650 mg Rectal Q6H PRN Rise Patience, MD   650 mg at 10/04/16 0234  . atorvastatin (LIPITOR) tablet 40 mg  40 mg Oral QHS Rise Patience, MD   40 mg at 10/06/16 2221  . bisacodyl (DULCOLAX) EC tablet 5 mg  5 mg Oral Daily PRN Saima  Rizwan, MD      . carvedilol (COREG) tablet 6.25 mg  6.25 mg Oral BID WC Debbe Odea, MD   6.25 mg at 10/07/16 5465  . clopidogrel (PLAVIX) tablet 75 mg  75 mg Oral Daily Debbe Odea, MD   75 mg at 10/07/16 0911  . cyanocobalamin ((VITAMIN B-12)) injection 1,000 mcg  1,000 mcg Subcutaneous Daily Brunetta Genera, MD   1,000 mcg at 10/07/16 0911  . dextrose 5 % and 0.2 % NaCl with KCl 20 mEq infusion   Intravenous Continuous Reyne Dumas, MD      . diphenhydrAMINE (BENADRYL) capsule 25 mg  25 mg Oral Q4H PRN Rise Patience, MD   25 mg at 10/06/16 1050  . feeding supplement (ENSURE ENLIVE)  (ENSURE ENLIVE) liquid 237 mL  237 mL Oral BID BM Rise Patience, MD   237 mL at 10/06/16 1506  . insulin aspart (novoLOG) injection 0-9 Units  0-9 Units Subcutaneous TID WC Rise Patience, MD   1 Units at 10/04/16 1745  . levofloxacin (LEVAQUIN) IVPB 750 mg  750 mg Intravenous Q48H Franky Macho, RPH   750 mg at 10/06/16 0746  . morphine (MS CONTIN) 12 hr tablet 15 mg  15 mg Oral Q12H Debbe Odea, MD   15 mg at 10/07/16 0911  . morphine (MSIR) tablet 15 mg  15 mg Oral Q4H PRN Debbe Odea, MD      . morphine 2 MG/ML injection 1 mg  1 mg Intravenous Q3H PRN Rise Patience, MD   1 mg at 10/05/16 0744  . ondansetron (ZOFRAN) tablet 4 mg  4 mg Oral Q6H PRN Rise Patience, MD       Or  . ondansetron [facility]) injection 4 mg  4 mg Intravenous Q6H PRN Rise Patience, MD      . polyethylene glycol (MIRALAX / GLYCOLAX) packet 17 g  17 g Oral Daily Debbe Odea, MD   17 g at 10/07/16 0910  . sodium chloride 0.9 % bolus 250 mL  250 mL Intravenous Once Rise Patience, MD      . Warfarin - Pharmacist Dosing Inpatient   Does not apply [place], [place] at 10/06/16 1800     Discharge Medications: Please see discharge summary for a list of discharge medications.  Relevant Imaging Results:  Relevant Lab Results:   Additional Information SS#: [id number]  Candie Chroman, LCSW

## 2016-10-07 NOTE — Care Management Note (Signed)
Case Management Note  Patient Details  Name: Richard Vang MRN: [id number] Date of Birth: 03-Jul-1946  Subjective/Objective:      Transferred to SDU on 12/16, after attempting work up for multiple myeloma  ( ct showing lytic lesions concern for mm), and obtaining a  Bone marrow bx, on 12/15 patient became febrile , hypoxic and hypotensive, also has ckd , poor po intake, pl effusion. Patient is from home with ex wife, he has a shower chair , rollator and commode extender pre previous NCM note. Await pt eval.  NCM will cont to follow for dc needs.   Per pt eval rec SNF, CSW following for SNF placement.                Action/Plan:   Expected Discharge Date:  10/04/16               Expected Discharge Plan:  Skilled Nursing Facility  In-House Referral:  Clinical Social Work  Discharge planning Services  CM Consult  Post Acute Care Choice:    Choice offered to:     DME Arranged:    DME Agency:     HH Arranged:    [place] Agency:     Status of Service:  Completed, signed off  If discussed at [employer], dates discussed:    Additional Comments:  Zenon Mayo, RN 10/07/2016, 2:00 PM

## 2016-10-07 NOTE — Clinical Social Work Note (Signed)
Patient transferred from 3S to 2W. Handoff information given to 2W CSW.  This CSW signing off.  Dayton Scrape, [place]

## 2016-10-07 NOTE — Telephone Encounter (Signed)
Pt is currently still in the hospital. Scheduled an appt for the pt to see Kale on 1/3 at 3pm for a hosp fu. Will mail letter with appt date and time.

## 2016-10-07 NOTE — Clinical Social Work Placement (Signed)
   CLINICAL SOCIAL WORK PLACEMENT  NOTE  Date:  10/07/2016  Patient Details  Name: Richard Vang MRN: [id number] Date of Birth: 10-25-1945  Clinical Social Work is seeking post-discharge placement for this patient at the [place] level of care (*CSW will initial, date and re-position this form in  chart as items are completed):  Yes   Patient/family provided with [place] Work Department's list of facilities offering this level of care within the geographic area requested by the patient (or if unable, by the patient's family).  Yes   Patient/family informed of their freedom to choose among providers that offer the needed level of care, that participate in Medicare, Medicaid or managed care program needed by the patient, have an available bed and are willing to accept the patient.  Yes   Patient/family informed of [place] [facility]'s ownership interest in [facility] and [facility], as well as of the fact that they are under no obligation to receive care at these facilities.  PASRR submitted to EDS on 10/07/16     PASRR number received on 10/07/16     Existing PASRR number confirmed on       FL2 transmitted to all facilities in geographic area requested by pt/family on 10/07/16     FL2 transmitted to all facilities within larger geographic area on       Patient informed that his/her managed care company has contracts with or will negotiate with certain facilities, including the following:            Patient/family informed of bed offers received.  Patient chooses bed at       Physician recommends and patient chooses bed at      Patient to be transferred to   on  .  Patient to be transferred to facility by       Patient family notified on   of transfer.  Name of family member notified:        PHYSICIAN Please sign FL2     Additional Comment:    _______________________________________________ Candie Chroman, LCSW 10/07/2016,  12:31 PM

## 2016-10-07 NOTE — Clinical Social Work Note (Signed)
Clinical Social Work Assessment  Patient Details  Name: Richard Vang MRN: [id number] Date of Birth: 1946/07/14  Date of referral:  10/07/16               Reason for consult:  Facility Placement, Discharge Planning                Permission sought to share information with:  Facility Sport and exercise psychologist, Family Supports Permission granted to share information::  Yes, Verbal Permission Granted  Name::     Ember Gottwald  Agency::  SNF's  Relationship::  Ex-wife  Contact Information:  [phone]  Housing/Transportation Living arrangements for the past 2 months:  [place] of Information:  Patient, Medical Team, Other (Comment Required) (Ex-wife) Patient Interpreter Needed:  None Criminal Activity/Legal Involvement Pertinent to Current Situation/Hospitalization:  No - Comment as needed Significant Relationships:  Adult Children, Significant Other, Other Family Members Lives with:  Adult Children, Significant Other, Relatives Do you feel safe going back to the place where you live?  Yes Need for family participation in patient care:  Yes (Comment)  Care giving concerns:  PT recommending SNF once medically stable for discharge.   Social Worker assessment / plan:  CSW met with patient. Ex-wife at bedside. CSW introduced role and explained that PT recommendations would be discussed. Patient and his ex-wife agreeable to SNF placement although they prefer to go home. Discussed preferences. No further concerns. CSW encouraged patient and his ex-wife to contact CSW as needed. CSW will continue to follow patient and his family for support and facilitate discharge to SNF once medically stable.  Employment status:  Retired Nurse, adult PT Recommendations:  [place] / Referral to community resources:  [place]  Patient/Family's Response to care:  Patient and his ex-wife agreeable to SNF placement.  Patient's family supportive and involved in patient's care. Patient and his ex-wife appreciated social work intervention.  Patient/Family's Understanding of and Emotional Response to Diagnosis, Current Treatment, and Prognosis:  Patient and his ex-wife understand and are agreeable to discharge plan. Patient and his ex-wife appear happy with hospital care.  Emotional Assessment Appearance:  Appears stated age Attitude/Demeanor/Rapport:  Other (Pleasant) Affect (typically observed):  Accepting, Appropriate, Calm, Pleasant Orientation:  Oriented to Self, Oriented to Place, Oriented to  Time, Oriented to Situation Alcohol / Substance use:  Never Used Psych involvement (Current and /or in the community):  No (Comment)  Discharge Needs  Concerns to be addressed:  Care Coordination Readmission within the last 30 days:  No Current discharge risk:  Dependent with Mobility Barriers to Discharge:  Continued Medical Work up, [place], LCSW 10/07/2016, 12:29 PM

## 2016-10-08 DIAGNOSIS — N170 Acute kidney failure with tubular necrosis: Secondary | ICD-10-CM

## 2016-10-08 LAB — CBC
HCT: 30.3 % — ABNORMAL LOW (ref 39.0–52.0)
Hemoglobin: 9.2 g/dL — ABNORMAL LOW (ref 13.0–17.0)
MCH: 30.2 pg (ref 26.0–34.0)
MCHC: 30.4 g/dL (ref 30.0–36.0)
MCV: 99.3 fL (ref 78.0–100.0)
PLATELETS: 84 10*3/uL — AB (ref 150–400)
RBC: 3.05 MIL/uL — AB (ref 4.22–5.81)
RDW: 18.7 % — ABNORMAL HIGH (ref 11.5–15.5)
WBC: 3.4 10*3/uL — ABNORMAL LOW (ref 4.0–10.5)

## 2016-10-08 LAB — PROTIME-INR
INR: 2.59
PROTHROMBIN TIME: 28.3 s — AB (ref 11.4–15.2)

## 2016-10-08 LAB — COMPREHENSIVE METABOLIC PANEL
ALBUMIN: 3.4 g/dL — AB (ref 3.5–5.0)
ALK PHOS: 119 U/L (ref 38–126)
ALT: 21 U/L (ref 17–63)
ANION GAP: 8 (ref 5–15)
AST: 47 U/L — ABNORMAL HIGH (ref 15–41)
BILIRUBIN TOTAL: 1.6 mg/dL — AB (ref 0.3–1.2)
BUN: 35 mg/dL — AB (ref 6–20)
CO2: 23 mmol/L (ref 22–32)
Calcium: 8.7 mg/dL — ABNORMAL LOW (ref 8.9–10.3)
Chloride: 115 mmol/L — ABNORMAL HIGH (ref 101–111)
Creatinine, Ser: 1.88 mg/dL — ABNORMAL HIGH (ref 0.61–1.24)
GFR calc Af Amer: 40 mL/min — ABNORMAL LOW (ref >60)
GFR, EST NON AFRICAN AMERICAN: 35 mL/min — AB (ref >60)
GLUCOSE: 125 mg/dL — AB (ref 65–99)
POTASSIUM: 3.9 mmol/L (ref 3.5–5.1)
Sodium: 146 mmol/L — ABNORMAL HIGH (ref 135–145)
TOTAL PROTEIN: 5.4 g/dL — AB (ref 6.5–8.1)

## 2016-10-08 LAB — GLUCOSE, CAPILLARY
GLUCOSE-CAPILLARY: 119 mg/dL — AB (ref 65–99)
Glucose-Capillary: 124 mg/dL — ABNORMAL HIGH (ref 65–99)
Glucose-Capillary: 108 mg/dL — ABNORMAL HIGH (ref 65–99)

## 2016-10-08 MED ORDER — MORPHINE SULFATE 15 MG PO TABS: 15.0000 mg | ORAL_TABLET | ORAL | 0 refills | Status: DC | PRN

## 2016-10-08 MED ORDER — POLYETHYLENE GLYCOL 3350 17 G PO PACK: 17.0000 g | PACK | Freq: Every day | ORAL | 0 refills | Status: DC

## 2016-10-08 MED ORDER — LEVOFLOXACIN 750 MG PO TABS: 750.0000 mg | ORAL_TABLET | ORAL | 0 refills | Status: DC

## 2016-10-08 MED ORDER — WARFARIN SODIUM 2 MG PO TABS: 2.0000 mg | ORAL_TABLET | Freq: Once | ORAL | Status: DC

## 2016-10-08 MED ORDER — MORPHINE SULFATE ER 15 MG PO TBCR: 15.0000 mg | EXTENDED_RELEASE_TABLET | Freq: Two times a day (BID) | ORAL | 0 refills | Status: DC

## 2016-10-08 MED ORDER — INSULIN ASPART 100 UNIT/ML ~~LOC~~ SOLN: SUBCUTANEOUS | 11 refills | Status: DC

## 2016-10-08 MED ORDER — CARVEDILOL 12.5 MG PO TABS: 12.5000 mg | ORAL_TABLET | Freq: Two times a day (BID) | ORAL | 2 refills | Status: DC

## 2016-10-08 MED ORDER — BISACODYL 5 MG PO TBEC: 5.0000 mg | DELAYED_RELEASE_TABLET | Freq: Every day | ORAL | 0 refills | Status: DC | PRN

## 2016-10-08 MED ORDER — ENSURE ENLIVE PO LIQD: 237.0000 mL | Freq: Two times a day (BID) | ORAL | 12 refills | Status: DC

## 2016-10-08 NOTE — Progress Notes (Signed)
Physical Therapy Treatment Patient Details Name: Richard Vang MRN: [id number] DOB: Jun 13, 1946 Today's Date: 10/08/2016    History of Present Illness Patient is a 70 yo male admitted 10/01/16 with low back pain.  CT showed lytic lesions with w/u - multiple myeloma.  Patient also with SOB and hypercalcemia.  PMH:   hypertension, diabetes, dyslipidemia, coronary artery disease status post drug-eluting PCI on 07/31/2016 and newly noted possibly ischemic cardiomyopathy ejection fraction 25-35%    PT Comments    Pt with improved mobility this date. Able to std pvt to chair. Pt with SOB but SpO2 >92% on RA. Pt con't to require modA for all mobility and would con't to benefit from ST-SNF upon d/c to improve indep with mobility to allow for safe transition home with spouse.  Follow Up Recommendations  SNF;Supervision/Assistance - 24 hour     Equipment Recommendations  None recommended by PT    Recommendations for Other Services       Precautions / Restrictions Precautions Precautions: Fall Restrictions Weight Bearing Restrictions: No    Mobility  Bed Mobility Overal bed mobility: Needs Assistance Bed Mobility: Rolling;Sidelying to Sit Rolling: Min guard Sidelying to sit: Min assist       General bed mobility comments: pt initiated and used bed rail, minA for trunk elevation only  Transfers Overall transfer level: Needs assistance Equipment used: Rolling walker (2 wheeled) Transfers: Sit to/from [facility] Sit to Stand: Mod assist;+2 physical assistance;Max assist Stand pivot transfers: Min assist;Mod assist;+2 physical assistance       General transfer comment: mod/maxA for initial power up with R knee blocked due to weakness and external rotation, pt able to march in place x 10 steps and then took 5 steps to chair, pt required min/modAx2 to maintain anterior weight shift. pt with SOB but SPO2 >92% on RA  Ambulation/Gait             General Gait  Details: unable due to weakness and SOB   Stairs            Wheelchair Mobility    Modified Rankin (Stroke Patients Only)       Balance Overall balance assessment: Needs assistance Sitting-balance support: Bilateral upper extremity supported;Feet supported Sitting balance-Leahy Scale: Poor     Standing balance support: Bilateral upper extremity supported Standing balance-Leahy Scale: Poor Standing balance comment: requires RW                    Cognition Arousal/Alertness: Awake/alert Behavior During Therapy: WFL for tasks assessed/performed;Flat affect;Anxious Overall Cognitive Status: Within Functional Limits for tasks assessed                      Exercises      General Comments General comments (skin integrity, edema, etc.): pt with purple rash t/o UEs and chest      Pertinent Vitals/Pain Pain Assessment: 0-10 Pain Score: 8  (back when coughing, 0/10 at rest) Pain Location: Low back with mobility Pain Descriptors / Indicators: Aching;Grimacing;Guarding;Sharp;Sore Pain Intervention(s): Monitored during session    Home Living                      Prior Function            PT Goals (current goals can now be found in the care plan section) Acute Rehab PT Goals Patient Stated Goal: get strong Progress towards PT goals: Progressing toward goals    Frequency  Min 3X/week      PT Plan Current plan remains appropriate    Co-evaluation PT/OT/SLP Co-Evaluation/Treatment: Yes Reason for Co-Treatment: For patient/therapist safety;Complexity of the patient's impairments (multi-system involvement) PT goals addressed during session: Mobility/safety with mobility       End of Session Equipment Utilized During Treatment: Gait belt Activity Tolerance: Patient tolerated treatment well Patient left: in chair;with call bell/phone within reach;with chair alarm set;with family/visitor present (OT present)     Time: 2669-1675 PT  Time Calculation (min) (ACUTE ONLY): 20 min  Charges:  $Therapeutic Activity: 8-22 mins                    G CodesKoleen Distance Palyn Scrima 11-05-2016, 10:53 AM   Kittie Plater, PT, DPT Pager #: [phone] Office #: [phone]

## 2016-10-08 NOTE — Evaluation (Signed)
Occupational Therapy Evaluation Patient Details Name: Richard Vang MRN: [id number] DOB: [birth year] Today's Date: 10/08/2016    History of Present Illness Patient is a 70 yo male admitted 10/01/16 with low back pain.  CT showed lytic lesions with w/u - multiple myeloma.  Patient also with SOB and hypercalcemia.  PMH:   hypertension, diabetes, dyslipidemia, coronary artery disease status post drug-eluting PCI on 07/31/2016 and newly noted possibly ischemic cardiomyopathy ejection fraction 25-35%   Clinical Impression   Pt reports he was independent with BADL PTA. Currently pt overall max assist for ADL and mod assist +2 for basic transfers. Pt presenting with generalized weakness, pain, deconditioning, SOB with activity, poor sitting/standing balance impacting his independence and safety with ADL and functional mobility. Recommending SNF for follow up to maximize independence and safety with ADL and functional mobility prior to return home. Pt would benefit from continued skilled OT to address established goals.    Follow Up Recommendations  SNF;Supervision/Assistance - 24 hour    Equipment Recommendations  None recommended by OT    Recommendations for Other Services       Precautions / Restrictions Precautions Precautions: Fall Restrictions Weight Bearing Restrictions: No      Mobility Bed Mobility Overal bed mobility: Needs Assistance Bed Mobility: Rolling;Sidelying to Sit Rolling: Min guard Sidelying to sit: Min assist       General bed mobility comments: pt initiated and used bed rail, minA for trunk elevation only  Transfers Overall transfer level: Needs assistance Equipment used: Rolling walker (2 wheeled) Transfers: Sit to/from [facility] Sit to Stand: Mod assist;+2 physical assistance;Max assist Stand pivot transfers: Min assist;Mod assist;+2 physical assistance       General transfer comment: mod/maxA for initial power up with R knee  blocked due to weakness and external rotation, pt able to march in place x 10 steps and then took 5 steps to chair, pt required min/modAx2 to maintain anterior weight shift. pt with SOB but SPO2 >92% on RA    Balance Overall balance assessment: Needs assistance Sitting-balance support: Bilateral upper extremity supported;Feet supported Sitting balance-Leahy Scale: Poor     Standing balance support: Bilateral upper extremity supported Standing balance-Leahy Scale: Poor Standing balance comment: requires RW                            ADL Overall ADL's : Needs assistance/impaired Eating/Feeding: Minimal assistance;Sitting   Grooming: Minimal assistance;Sitting;Oral care Grooming Details (indicate cue type and reason): Assist for applying toothpaste to toothbrush. Significant increased time required. Upper Body Bathing: Maximal assistance;Sitting   Lower Body Bathing: Total assistance;+2 for physical assistance;Sit to/from stand   Upper Body Dressing : Maximal assistance;Sitting Upper Body Dressing Details (indicate cue type and reason): to don hospital gown Lower Body Dressing: Maximal assistance;+2 for physical assistance;Sit to/from stand Lower Body Dressing Details (indicate cue type and reason): to don socks sitting EOB Toilet Transfer: Moderate assistance;+2 for physical assistance;Stand-pivot;BSC;RW Toilet Transfer Details (indicate cue type and reason): Simulated by stand pivot to chair         Functional mobility during ADLs: Moderate assistance;+2 for physical assistance;Rolling walker (for stand pivot only) General ADL Comments: Discussed post acute rehab with pt and wife; they are agreeable however pt does want to return home directly from hospital. Pt with increased SOB throughout session; SpO2 in low 90s on RA.     Vision Vision Assessment?: No apparent visual deficits   Perception  Praxis      Pertinent Vitals/Pain Pain Assessment: 0-10 Pain  Score: 8  Pain Location: Low back with mobility Pain Descriptors / Indicators: Aching;Grimacing;Guarding;Sharp;Sore Pain Intervention(s): Monitored during session;Repositioned     Hand Dominance     Extremity/Trunk Assessment Upper Extremity Assessment Upper Extremity Assessment: Generalized weakness   Lower Extremity Assessment Lower Extremity Assessment: Defer to PT evaluation   Cervical / Trunk Assessment Cervical / Trunk Assessment: Kyphotic   Communication Communication Communication: No difficulties   Cognition Arousal/Alertness: Awake/alert Behavior During Therapy: WFL for tasks assessed/performed;Flat affect;Anxious Overall Cognitive Status: Within Functional Limits for tasks assessed                     General Comments       Exercises       Shoulder Instructions      Home Living Family/patient expects to be discharged to:: Unsure Living Arrangements: Children;Other relatives;Spouse/significant other Available Help at Discharge: Family;Available 24 hours/day Type of Home: House Home Access: Stairs to enter [employer] of Steps: 3 Entrance Stairs-Rails: Right Home Layout: One level     Bathroom Shower/Tub: Teacher, early years/pre: Handicapped height     Home Equipment: Clinical cytogeneticist - 4 wheels;Walker - 2 wheels;Toilet riser;Wheelchair - manual;Bedside commode          Prior Functioning/Environment Level of Independence: Independent        Comments: Working        OT Problem List: Decreased strength;Decreased activity tolerance;Impaired balance (sitting and/or standing);Decreased safety awareness;Decreased knowledge of use of DME or AE;Cardiopulmonary status limiting activity;Obesity;Impaired UE functional use;Pain   OT Treatment/Interventions: Self-care/ADL training;Energy conservation;DME and/or AE instruction;Therapeutic activities;Patient/family education;Balance training    OT Goals(Current goals can be  found in the care plan section) Acute Rehab OT Goals Patient Stated Goal: get stronger OT Goal Formulation: With patient/family Time For Goal Achievement: 10/22/16 Potential to Achieve Goals: Good ADL Goals Pt Will Perform Grooming: with supervision;sitting Pt Will Perform Upper Body Bathing: with supervision;sitting Pt Will Perform Lower Body Bathing: with min assist;sit to/from stand Pt Will Transfer to Toilet: with min assist;ambulating;bedside commode (over toilet)  OT Frequency: Min 2X/week   Barriers to D/C:            Co-evaluation PT/OT/SLP Co-Evaluation/Treatment: Yes Reason for Co-Treatment: For patient/therapist safety;Complexity of the patient's impairments (multi-system involvement) PT goals addressed during session: Mobility/safety with mobility OT goals addressed during session: ADL's and self-care      End of Session Equipment Utilized During Treatment: Rolling walker  Activity Tolerance: Patient tolerated treatment well Patient left: in chair;with call bell/phone within reach;with chair alarm set;with family/visitor present;with nursing/sitter in room   Time: 8185-6314 OT Time Calculation (min): 27 min Charges:  OT General Charges $OT Visit: 1 Procedure OT Evaluation $OT Eval Moderate Complexity: 1 Procedure G-Codes:     Binnie Kand M.S., OTR/L Pager: [phone]  10/08/2016, 11:05 AM

## 2016-10-08 NOTE — Progress Notes (Signed)
Clinical Social Worker facilitated patient discharge including contacting patient family and facility to confirm patient discharge plans.  Clinical information faxed to facility and family agreeable with plan.  CSW arranged ambulance transport via [facility] to [facility] .  RN Ronalee Belts to call [phone] (for rm. [phone]) for report prior to discharge.  Clinical Social Worker will sign off for now as social work intervention is no longer needed. Please consult [name] again if new need arises.  Rhea Pink, MSW, [place]

## 2016-10-08 NOTE — Discharge Summary (Signed)
Physician Discharge Summary  Richard Vang MRN: [id number] DOB/AGE: [birth year] 70 y.o.  PCP: Richard Mariscal, MD   Admit date: 10/01/2016 Discharge date: 10/08/2016  Discharge Diagnoses:    Principal Problem:   Hypercalcemia Active Problems:   Essential hypertension   Chronic atrial fibrillation (HCC)   Cardiomyopathy, ischemic: EF ~25 % by LV Gram   CAD S/P percutaneous coronary angioplasty   Thrombocytopenia (HCC)   Low back pain   AKI (acute kidney injury) ([place])   Acute congestive heart failure (HCC)   Bone mass   Lytic bone lesions on xray   Anemia   Multiple myeloma without remission ([place])   B12 deficiency    Follow-up recommendations Follow-up with PCP in 3-5 days , including all  additional recommended appointments as below Follow-up CBC, CMP, calcium, magnesium in 3-5 days Follow-up with Richard Lone MD , ONCOLOGY Consider outpatient nephrology referral if renal function worsens-discussed with Dr. Justin Vang  prior to discharge today Monitor INR weekly      Current Discharge Medication List    START taking these medications   Details  bisacodyl (DULCOLAX) 5 MG EC tablet Take 1 tablet (5 mg total) by mouth daily as needed for moderate constipation. Qty: 30 tablet, Refills: 0    feeding supplement, ENSURE ENLIVE, (ENSURE ENLIVE) LIQD Take 237 mLs by mouth 2 (two) times daily between meals. Qty: 237 mL, Refills: 12    levofloxacin (LEVAQUIN) 750 MG tablet Take 1 tablet (750 mg total) by mouth every other day. Qty: 2 tablet, Refills: 0    morphine (MS CONTIN) 15 MG 12 hr tablet Take 1 tablet (15 mg total) by mouth every 12 (twelve) hours. Qty: 20 tablet, Refills: 0    morphine (MSIR) 15 MG tablet Take 1 tablet (15 mg total) by mouth every 4 (four) hours as needed for severe pain. Qty: 30 tablet, Refills: 0    polyethylene glycol (MIRALAX / GLYCOLAX) packet Take 17 g by mouth daily. Qty: 14 each, Refills: 0      CONTINUE these medications which have CHANGED    Details  carvedilol (COREG) 12.5 MG tablet Take 1 tablet (12.5 mg total) by mouth 2 (two) times daily with a meal. Qty: 60 tablet, Refills: 2      CONTINUE these medications which have NOT CHANGED   Details  atorvastatin (LIPITOR) 40 MG tablet Take 1 tablet (40 mg total) by mouth at bedtime. Qty: 30 tablet, Refills: 12    Cholecalciferol (VITAMIN D3) 5000 units CAPS Take 1 capsule by mouth daily.     clopidogrel (PLAVIX) 75 MG tablet Take 1 tablet (75 mg total) by mouth daily with breakfast. Qty: 30 tablet, Refills: 12    potassium chloride SA (K-DUR,KLOR-CON) 20 MEQ tablet Take 20 mEq by mouth 2 (two) times daily.    warfarin (COUMADIN) 6 MG tablet Take 6 mg by mouth every morning.     novolog SSI  Correction coverage: Sensitive (thin, NPO, renal)   CBG < 70: implement hypoglycemia protocol   CBG 70 - 120: 0 units   CBG 121 - 150: 1 unit   CBG 151 - 200: 2 units   CBG 201 - 250: 3 units   CBG 251 - 300: 5 units   CBG 301 - 350: 7 units   CBG 351 - 400 9 units   CBG > 400 call MD and obtain STAT lab verification      STOP taking these medications     furosemide (LASIX) 40 MG tablet  ibuprofen (ADVIL,MOTRIN) 200 MG tablet      lisinopril (PRINIVIL,ZESTRIL) 20 MG tablet      metFORMIN (GLUCOPHAGE-XR) 500 MG 24 hr tablet      oxyCODONE (OXY IR/ROXICODONE) 5 MG immediate release tablet          Discharge Condition: Stable Discharge Instructions Get Medicines reviewed and adjusted: Please take all your medications with you for your next visit with your Primary MD  Please request your Primary MD to go over all hospital tests and procedure/radiological results at the follow up, please ask your Primary MD to get all Hospital records sent to his/her office.  If you experience worsening of your admission symptoms, develop shortness of breath, life threatening emergency, suicidal or homicidal thoughts you must seek medical attention immediately by calling 911 or  calling your MD immediately if symptoms less severe.  You must read complete instructions/literature along with all the possible adverse reactions/side effects for all the Medicines you take and that have been prescribed to you. Take any new Medicines after you have completely understood and accpet all the possible adverse reactions/side effects.   Do not drive when taking Pain medications.   Do not take more than prescribed Pain, Sleep and Anxiety Medications  Special Instructions: If you have smoked or chewed Tobacco in the last 2 yrs please stop smoking, stop any regular Alcohol and or any Recreational drug use.  Wear Seat belts while driving.  Please note  You were cared for by a hospitalist during your hospital stay. Once you are discharged, your primary care physician will handle any further medical issues. Please note that NO REFILLS for any discharge medications will be authorized once you are discharged, as it is imperative that you return to your primary care physician (or establish a relationship with a primary care physician if you do not have one) for your aftercare needs so that they can reassess your need for medications and monitor your lab values.  Discharge Instructions    Diet - low sodium heart healthy    Complete by:  As directed    Increase activity slowly    Complete by:  As directed        No Known Allergies    Disposition: SNF   Consults Oncology  Significant Diagnostic Studies:  Ct Abdomen Pelvis Wo Contrast  Result Date: 10/02/2016 CLINICAL DATA:  70 year old male with 70 year old male with abdominal pain and back pain x5 weeks. EXAM: CT ABDOMEN AND PELVIS WITHOUT CONTRAST TECHNIQUE: Multidetector CT imaging of the abdomen and pelvis was performed following the standard protocol without IV contrast. COMPARISON:  Abdominal CT dated 11/03/2010 FINDINGS: Evaluation of this exam is limited in the absence of intravenous contrast. Lower chest: Partially visualized moderate  right pleural effusion with associated partial compressive atelectasis of the right lower lobe. Superimposed pneumonia is not excluded. Clinical correlation is recommended. The pleural effusion is new since the study of 20/12 2012. There is coronary vascular calcification. Top-normal cardiac size. There is hypoattenuation of the cardiac blood pool suggestive of a degree of anemia. Clinical correlation is recommended. No intra-abdominal free air. There is diffuse mesenteric stranding and small ascites. Hepatobiliary: Cirrhosis. There is no intrahepatic biliary ductal dilatation. Stones noted within the gallbladder. The gallbladder is predominantly contracted. Mild haziness of the gallbladder wall likely related to cirrhosis and ascites. Ultrasound may provide better evaluation if there is clinical concern for acute cholecystitis. Pancreas: Unremarkable. No pancreatic ductal dilatation or surrounding inflammatory changes. Spleen: Normal in size without focal abnormality. Adrenals/Urinary Tract:  The adrenal glands appear unremarkable. Multiple nonobstructing bilateral renal calculi measuring up to 4 mm. There is no hydronephrosis on either side. A 2 cm left renal inferior pole exophytic high attenuating lesions is incompletely characterized but most likely represents a complex/hemorrhagic cyst. Further evaluation with ultrasound is recommended. The visualized ureters and urinary bladder appear unremarkable. Stomach/Bowel: There is diverticulosis of the second portion of the duodenum at the head of the pancreas measuring up to 3.5 cm. No definite associated active inflammatory changes. Mild thickened appearance of the colonic folds may be related to underdistention or hepatic colopathy. Mild colitis is less likely but not entirely excluded. Correlation with clinical exam and stool cultures recommended. There is no evidence of bowel obstruction. Normal appendix. Vascular/Lymphatic: There is advanced aortoiliac  atherosclerotic disease. There is no aneurysmal dilatation. The IVC is grossly unremarkable. Evaluation of the vasculature is limited in the absence of intravenous contrast. No portal venous gas identified. There is no adenopathy. Reproductive: Mild enlargement of the prostate gland measuring up to 5.3 cm in transverse axial diameter. Other: Small fat containing right inguinal hernia. There is mild diffuse subcutaneous edema and anasarca. No fluid collection. Musculoskeletal: There is osteopenia with multilevel degenerative changes of the spine. Multilevel old-appearing compression deformity most prominent involving the L4 with approximately 30% loss of vertebral body height. No definite acute fracture. A 4.0 x 2.4 cm lytic lesion involving the right iliac bone demonstrates aggressive features suggest cortical breakthrough and is concerning for malignancy. Further evaluation with MRI is recommended. IMPRESSION: Cirrhosis with small ascites and mild anasarca. Cholelithiasis. Duodenal diverticulosis. Hepatic colopathy versus less likely mild colitis. Correlation with clinical exam and stool cultures recommended. No bowel obstruction. Normal appendix. Moderate right pleural effusion with partial compressive atelectasis of the right lower lobe. Lytic lesion in the right iliac bone with associated cortical breakthrough. MRI is recommended for further evaluation. Nonobstructing bilateral renal calculi.  No hydronephrosis. Left renal inferior pole high attenuating lesion, incompletely characterized, likely a complex/hemorrhagic cyst. Ultrasound is recommended for further characterization. Electronically Signed   By: Anner Crete M.D.   On: 10/02/2016 01:30   Dg Chest 1 View  Result Date: 10/03/2016 CLINICAL DATA:  Chest pain.  Right iliac crest biopsy in CT. EXAM: CHEST 1 VIEW COMPARISON:  10/02/2016 FINDINGS: Cardiac enlargement. Progression of vascular congestion and bilateral edema. Progression of right  effusion. Bibasilar atelectasis. Widening of the superior mediastinum compatible with vascular ectasia. IMPRESSION: Congestive heart failure with progression of edema and right pleural effusion. Bibasilar atelectasis. Limited evaluation of skeletal structures. No displaced rib fracture identified. Electronically Signed   By: Franchot Gallo M.D.   On: 10/03/2016 11:19   Ct L-spine No Charge  Result Date: 10/02/2016 CLINICAL DATA:  70 year old male with 70 year old male with back pain x5 weeks. EXAM: CT LUMBAR SPINE WITHOUT CONTRAST TECHNIQUE: Multidetector CT imaging of the lumbar spine was performed without intravenous contrast administration. Multiplanar CT image reconstructions were also generated. COMPARISON:  Abdominal CT dated 10/02/2016 and 11/03/2010 FINDINGS: Segmentation: 5 lumbar type vertebrae. Alignment: Normal. Vertebrae: There is advanced osteopenia suspicious for diffuse lytic metastatic process or underlying marrow abnormality or myeloma. Correlation with clinical exam and further evaluation with MRI or bone scan is recommended. Multilevel age indeterminate compression deformity involving T10, T11, T12, L3 and L4 noted. There is approximately 25% loss of vertebral body height at L4. An acute fracture involving L4 is not excluded. Evaluation is very limited due to advanced osteopenia. Correlation with clinical exam and point tenderness and further evaluation with MRI  if clinically indicated recommended. No retropulsed fragment identified. There is endplate irregularity and Schmorl node involving the superior endplate of the L5. A lytic lesion with cortical breakthrough noted in the right iliac bone. Paraspinal and other soft tissues: Bilateral renal stones, aortoiliac atherosclerotic disease, and small ascites as seen on the CT. The paraspinal soft tissues appear unremarkable. Partially visualized right pleural effusion. Disc levels: Multilevel disc disease with endplate irregularity. IMPRESSION: Advanced osseous  demineralization may be related to diffuse lytic metastatic disease or myeloma. Correlation with clinical exam and further evaluation with MRI or bone scan recommended. Evaluation for acute fracture is very limited due to osteopenia. Multilevel age indeterminate compression deformity most prominent at L4. An acute fracture involving the L4 vertebra is not entirely excluded. Correlation with point tenderness recommended. No retropulsed fragment. Lytic lesion with aggressive features in the right iliac bone concerning for malignancy. Further evaluation with MRI or bone scan recommended. Partially visualized right pleural effusion. Electronically Signed   By: Anner Crete M.D.   On: 10/02/2016 03:41   Ct Biopsy  Result Date: 10/03/2016 INDICATION: 70 year old male with 70 year old male with a history of possible multiple myeloma. EXAM: CT BIOPSY MEDICATIONS: None. ANESTHESIA/SEDATION: Moderate (conscious) sedation was employed during this procedure. A total of Versed 1.5 mg and Fentanyl 50 mcg was administered intravenously. Moderate Sedation Time: 21 minutes. The patient's level of consciousness and vital signs were monitored continuously by radiology nursing throughout the procedure under my direct supervision. FLUOROSCOPY TIME:  CT COMPLICATIONS: None PROCEDURE: The procedure risks, benefits, and alternatives were explained to the patient. Questions regarding the procedure were encouraged and answered. The patient understands and consents to the procedure. Patient was attempted to position into the prone position. He was unable to comfortably achieve this. When he was turning himself into a supine position, he experienced left-sided chest pain. Scout CT of the pelvis was performed for surgical planning purposes. The anterior pelvis was prepped with Betadinein a sterile fashion, and a sterile drape was applied covering the operative field. A sterile gown and sterile gloves were used for the procedure. Local anesthesia was  provided with 1% Lidocaine. We targeted the right anterior iliac bone for biopsy. The skin and subcutaneous tissues were infiltrated with 1% lidocaine without epinephrine. A small stab incision was made with an 11 blade scalpel, and an 11 gauge Murphy needle was advanced with CT guidance to the posterior cortex. Manual forced was used to advance the needle through the posterior cortex and the stylet was removed. A bone marrow aspirate was retrieved and passed to a cytotechnologist in the room. The Murphy needle was then advanced without the stylet for a core biopsy. The core biopsy was retrieved and also passed to a cytotechnologist. Manual pressure was used for hemostasis and a sterile dressing was placed. No complications were encountered no significant blood loss was encountered. Patient tolerated the procedure well and remained hemodynamically stable throughout. IMPRESSION: Status post CT-guided bone marrow biopsy, with tissue specimen sent to pathology for complete histopathologic analysis Signed, Dulcy Fanny. Earleen Newport, DO [facility] Radiology PLAN: Given the patient's acute discomfort during repositioning on the table, a chest x-ray will be acquired to investigate for a possible left-sided rib fracture. Electronically Signed   By: Corrie Mckusick D.O.   On: 10/03/2016 11:24   Dg Chest Port 1 View  Result Date: 10/05/2016 CLINICAL DATA:  Hypoxia EXAM: PORTABLE CHEST 1 VIEW COMPARISON:  10/04/2016 FINDINGS: Cardiomegaly with vascular congestion. Improving interstitial opacities, likely improving interstitial edema.  Mild edema persists. Small right pleural effusion noted. IMPRESSION: Mild CHF, slightly improved since prior study. Electronically Signed   By: Rolm Baptise M.D.   On: 10/05/2016 07:42   Dg Chest Port 1 View  Result Date: 10/04/2016 CLINICAL DATA:  Fever EXAM: PORTABLE CHEST 1 VIEW COMPARISON:  10/03/2016 FINDINGS: Cardiomegaly with vascular  congestion. Diffuse interstitial prominence throughout the lungs. More focal airspace opacity throughout the right lung with probable small right effusion. IMPRESSION: Cardiomegaly, possible mild interstitial edema. Focal opacities in the right could reflect pneumonia. Small right effusion. Electronically Signed   By: Rolm Baptise M.D.   On: 10/04/2016 08:19   Dg Chest Port 1 View  Result Date: 10/02/2016 CLINICAL DATA:  70 y/o  M; shortness of breath. EXAM: PORTABLE CHEST 1 VIEW COMPARISON:  11/03/2010 chest radiograph. FINDINGS: Cardiomegaly appears increased in comparison with prior radiographs given projection and technique. Prominent interstitial markings. Right basilar opacity obscures the right heart border. No definite pleural effusion. Bones are unremarkable. IMPRESSION: Increased cardiomegaly from prior radiographs may represent heart failure or pericardial effusion. Increased interstitial markings and opacity partially obscuring the right heart border probably represents pulmonary edema. Pneumonia is not excluded. Electronically Signed   By: Kristine Garbe M.D.   On: 10/02/2016 00:21   Dg Bone Survey Met  Result Date: 10/02/2016 CLINICAL DATA:  Low back pain for months, metastatic bone survey. Followup lytic lesion RIGHT iliac bone. Pending bone marrow biopsy for suspected multiple myeloma. EXAM: METASTATIC BONE SURVEY COMPARISON:  CT abdomen and pelvis/lumbar spine October 02, 2016 and CT chest November 03, 2010 FINDINGS: Skull:  No destructive bony lesions. RIGHT upper extremity: Small lytic lesion RIGHT acromion, not included on on prior chest CT. Widened scapholunate interval most compatible with old ligamentous injury associated with severe radiocarpal osteoarthrosis. Tiny lucency in the triquetrum bone. Moderate vascular calcifications. LEFT upper extremity: Surgical screw proximal LEFT humerus. No destructive bony lesions. Moderate vascular calcifications. Cervical spine:  Advanced degenerative change without destructive bony lesions. Symmetric vascular calcifications in the neck. Thoracic spine: Moderate chronic lower thoracic compression fractures. No destructive bony lesions. Lumbar spine: Old L4 mild to moderate compression fracture, mild L3 old compression fracture. No destructive bony lesions. Pelvis: No acute osseous process. Lytic lesion RIGHT iliac bone corresponding to today's CT finding. RIGHT lower extremity: No destructive bony lesions or acute osseous process. LEFT lower extremity: Small lytic lesion proximal LEFT femur diaphysis involving the intramedullary cavity and cortex. No acute osseous process. Chest: Cardiomegaly. Pulmonary vascular congestion. RIGHT pleural effusion better seen on today's CT abdomen and pelvis. Generalized osteopenia. IMPRESSION: Lytic lesion RIGHT iliac bone corresponding to today's CT finding. Additional small lytic lesions RIGHT acromion, LEFT femur, RIGHT carpal bones, concerning for metastatic disease in the setting of suspected neoplasm. Osteopenia. Electronically Signed   By: Elon Alas M.D.   On: 10/02/2016 22:01        Filed Weights   10/05/16 0331 10/06/16 0500 10/07/16 0500  Weight: 103.1 kg (227 lb 4.7 oz) 101.4 kg (223 lb 8.7 oz) 102.9 kg (226 lb 13.7 oz)     Microbiology: Recent Results (from the past 240 hour(s))  Culture, Urine     Status: Abnormal   Collection Time: 10/04/16  2:42 AM  Result Value Ref Range Status   Specimen Description URINE, RANDOM  Final   Special Requests NONE  Final   Culture 30,000 COLONIES/mL ENTEROCOCCUS FAECALIS (A)  Final   Report Status 10/06/2016 FINAL  Final   Organism ID, Bacteria ENTEROCOCCUS  FAECALIS (A)  Final      Susceptibility   Enterococcus faecalis - MIC*    AMPICILLIN <=2 SENSITIVE Sensitive     LEVOFLOXACIN 1 SENSITIVE Sensitive     NITROFURANTOIN <=16 SENSITIVE Sensitive     VANCOMYCIN 1 SENSITIVE Sensitive     * 30,000 COLONIES/mL ENTEROCOCCUS  FAECALIS  Culture, blood (Routine X 2) w Reflex to ID Panel     Status: None (Preliminary result)   Collection Time: 10/04/16  2:56 AM  Result Value Ref Range Status   Specimen Description BLOOD RIGHT HAND  Final   Special Requests IN PEDIATRIC BOTTLE 2CC  Final   Culture NO GROWTH 3 DAYS  Final   Report Status PENDING  Incomplete  Culture, blood (Routine X 2) w Reflex to ID Panel     Status: None (Preliminary result)   Collection Time: 10/04/16  3:02 AM  Result Value Ref Range Status   Specimen Description BLOOD LEFT ANTECUBITAL  Final   Special Requests BOTTLES DRAWN AEROBIC AND ANAEROBIC 5CC  Final   Culture NO GROWTH 3 DAYS  Final   Report Status PENDING  Incomplete  MRSA PCR Screening     Status: None   Collection Time: 10/04/16  8:38 AM  Result Value Ref Range Status   MRSA by PCR NEGATIVE NEGATIVE Final    Comment:        The GeneXpert MRSA Assay (FDA approved for NASAL specimens only), is one component of a comprehensive MRSA colonization surveillance program. It is not intended to diagnose MRSA infection nor to guide or monitor treatment for MRSA infections.        Blood Culture    Component Value Date/Time   SDES BLOOD LEFT ANTECUBITAL 10/04/2016 0302   SPECREQUEST BOTTLES DRAWN AEROBIC AND ANAEROBIC 5CC 10/04/2016 0302   CULT NO GROWTH 3 DAYS 10/04/2016 0302   REPTSTATUS PENDING 10/04/2016 0302      Labs: Results for orders placed or performed during the hospital encounter of 10/01/16 (from the past 48 hour(s))  Glucose, capillary     Status: Abnormal   Collection Time: 10/06/16 12:21 PM  Result Value Ref Range   Glucose-Capillary 117 (H) 65 - 99 mg/dL  Glucose, capillary     Status: Abnormal   Collection Time: 10/06/16  4:12 PM  Result Value Ref Range   Glucose-Capillary 109 (H) 65 - 99 mg/dL  Glucose, capillary     Status: Abnormal   Collection Time: 10/06/16  9:03 PM  Result Value Ref Range   Glucose-Capillary 131 (H) 65 - 99 mg/dL  CBC      Status: Abnormal   Collection Time: 10/07/16  4:41 AM  Result Value Ref Range   WBC 3.0 (L) 4.0 - 10.5 K/uL   RBC 3.04 (L) 4.22 - 5.81 MIL/uL   Hemoglobin 9.4 (L) 13.0 - 17.0 g/dL    Comment: CONSISTENT WITH PREVIOUS RESULT   HCT 30.7 (L) 39.0 - 52.0 %   MCV 101.0 (H) 78.0 - 100.0 fL   MCH 30.9 26.0 - 34.0 pg   MCHC 30.6 30.0 - 36.0 g/dL   RDW 19.0 (H) 11.5 - 15.5 %   Platelets 85 (L) 150 - 400 K/uL    Comment: CONSISTENT WITH PREVIOUS RESULT  Protime-INR     Status: Abnormal   Collection Time: 10/07/16  4:41 AM  Result Value Ref Range   Prothrombin Time 36.8 (H) 11.4 - 15.2 seconds   INR 3.60   Comprehensive metabolic panel  Status: Abnormal   Collection Time: 10/07/16  4:41 AM  Result Value Ref Range   Sodium 148 (H) 135 - 145 mmol/L   Potassium 3.3 (L) 3.5 - 5.1 mmol/L   Chloride 113 (H) 101 - 111 mmol/L   CO2 28 22 - 32 mmol/L   Glucose, Bld 116 (H) 65 - 99 mg/dL   BUN 42 (H) 6 - 20 mg/dL   Creatinine, Ser 1.80 (H) 0.61 - 1.24 mg/dL   Calcium 9.1 8.9 - 10.3 mg/dL   Total Protein 5.4 (L) 6.5 - 8.1 g/dL   Albumin 3.4 (L) 3.5 - 5.0 g/dL   AST 51 (H) 15 - 41 U/L   ALT 21 17 - 63 U/L   Alkaline Phosphatase 128 (H) 38 - 126 U/L   Total Bilirubin 1.3 (H) 0.3 - 1.2 mg/dL   GFR calc non Af Amer 37 (L) >60 mL/min   GFR calc Af Amer 43 (L) >60 mL/min    Comment: (NOTE) The eGFR has been calculated using the CKD EPI equation. This calculation has not been validated in all clinical situations. eGFR's persistently <60 mL/min signify possible Chronic Kidney Disease.    Anion gap 7 5 - 15  Glucose, capillary     Status: Abnormal   Collection Time: 10/07/16  8:08 AM  Result Value Ref Range   Glucose-Capillary 104 (H) 65 - 99 mg/dL   Comment 1 Notify RN    Comment 2 Document in Chart   Glucose, capillary     Status: Abnormal   Collection Time: 10/07/16 12:25 PM  Result Value Ref Range   Glucose-Capillary 134 (H) 65 - 99 mg/dL   Comment 1 Notify RN    Comment 2 Document in  Chart   Glucose, capillary     Status: Abnormal   Collection Time: 10/07/16  4:42 PM  Result Value Ref Range   Glucose-Capillary 107 (H) 65 - 99 mg/dL  Glucose, capillary     Status: Abnormal   Collection Time: 10/07/16  9:37 PM  Result Value Ref Range   Glucose-Capillary 139 (H) 65 - 99 mg/dL  CBC     Status: Abnormal   Collection Time: 10/08/16  2:34 AM  Result Value Ref Range   WBC 3.4 (L) 4.0 - 10.5 K/uL   RBC 3.05 (L) 4.22 - 5.81 MIL/uL   Hemoglobin 9.2 (L) 13.0 - 17.0 g/dL   HCT 30.3 (L) 39.0 - 52.0 %   MCV 99.3 78.0 - 100.0 fL   MCH 30.2 26.0 - 34.0 pg   MCHC 30.4 30.0 - 36.0 g/dL   RDW 18.7 (H) 11.5 - 15.5 %   Platelets 84 (L) 150 - 400 K/uL    Comment: CONSISTENT WITH PREVIOUS RESULT  Protime-INR     Status: Abnormal   Collection Time: 10/08/16  2:34 AM  Result Value Ref Range   Prothrombin Time 28.3 (H) 11.4 - 15.2 seconds   INR 2.59   Comprehensive metabolic panel     Status: Abnormal   Collection Time: 10/08/16  2:34 AM  Result Value Ref Range   Sodium 146 (H) 135 - 145 mmol/L   Potassium 3.9 3.5 - 5.1 mmol/L   Chloride 115 (H) 101 - 111 mmol/L   CO2 23 22 - 32 mmol/L   Glucose, Bld 125 (H) 65 - 99 mg/dL   BUN 35 (H) 6 - 20 mg/dL   Creatinine, Ser 1.88 (H) 0.61 - 1.24 mg/dL   Calcium 8.7 (L) 8.9 - 10.3 mg/dL  Total Protein 5.4 (L) 6.5 - 8.1 g/dL   Albumin 3.4 (L) 3.5 - 5.0 g/dL   AST 47 (H) 15 - 41 U/L   ALT 21 17 - 63 U/L   Alkaline Phosphatase 119 38 - 126 U/L   Total Bilirubin 1.6 (H) 0.3 - 1.2 mg/dL   GFR calc non Af Amer 35 (L) >60 mL/min   GFR calc Af Amer 40 (L) >60 mL/min    Comment: (NOTE) The eGFR has been calculated using the CKD EPI equation. This calculation has not been validated in all clinical situations. eGFR's persistently <60 mL/min signify possible Chronic Kidney Disease.    Anion gap 8 5 - 15  Glucose, capillary     Status: Abnormal   Collection Time: 10/08/16  6:13 AM  Result Value Ref Range   Glucose-Capillary 119 (H) 65 - 99  mg/dL      HPI :  Richard Vang is a wonderful 70 y.o. male who has been referred to [name] by Dr Debbe Odea, MD for evaluation and management of new findings of lytic lesions in the bone and hypercalcemia thought to be related to malignancy. Overall concern for multiple myeloma.  Patient has a history of hypertension, dyslipidemia, sleep apnea, diabetes, coronary artery disease status post drug-eluting PCI in the proximal LAD on 07/31/2016 on dual antiplatelet therapy and Coumadin, systolic heart failure with ejection fraction of 25-35% on cath in October 2017. Patient presented to his primary care physician with increasing shortness of breath and was referred to his cardiologist who eventually referred him to the emergency room for further evaluation and management.  EKG showed atrial fibrillation with PVCs. Patient reported increasing your back pain over the last 6 weeks even at rest. He had labs which showed anemia, hypercalcemia with a calcium of more than 15 and acute on chronic renal failure. CT of the lumbar spine and abdomen showed possible diffuse lytic lesions in the spine, multiage indeterminate compression deformity most prominent at L4. Lytic lesion with aggressive features in the right leg bone concerning for malignancy. Patient was admitted for further evaluation and management.   HOSPITAL COURSE:   Hypercalcemia,  lytic lesions in the CT scan concerning for multiple myeloma prelimbone marrow bx -- shows 67% plasma cells consistent with multiple myeloma Hypercalcemia + and bone lytic lesions -  oncology consulted  , patient evaluated by Brunetta Genera, MD 12/14 - underwent bone marrow biopsy 12/15 - Patient Ca > 15- treated aggressively with Lasix , Pamidronate, Calcitonin s/c for 48 hrs - down to   8.7 today - skeletal survey: Consistent with multiple myeloma,Lytic lesion RIGHT iliac bone, RIGHT acromion, LEFT femur, RIGHT carpal bones   - urine immunofixation>> bence  jones protein + No M spike on SPEP -- 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 Further treatment per  Dr. Irene Limbo   Will need SNF placement  AKI on CKD 3, baseline around 1.3-1.5, discussed with nephrology, this is probably secondary to patient's hypercalcemia. Renal biopsy not indicated at this time. Discussed with Dr. Justin Vang - may be related to MM as above- slow rise may be due to Lasix and now poor oral intake Creatinine peaked at 2.42, now 1.88 on the day of discharge Consider outpatient nephrology evaluation if renal function worsens Off ACE inhibitor and NSAIDs  Hypernatremia , improving on one quarter normal saline Lasix has been discontinued Encourage free water intake  Pleural effusion  - on admission- improved with a few days of Lasix- ? Due to CHF  Now on ivf for aki  Fever with hypoxia and hypotension secondary to probable aspiration pneumonia Empirically started on levofloxacin, now completed - CXR suggestive of possible RLL infiltrate- ? Aspiration - was sedated and then confused and agitated possibly from medications given for biopsy Fever has resolved Blood culture no growth so far      On and off confusion/ Low back pain  Likely secondary to lytic lesions from multiple myeloma Started on MS Contin and short-acting morphine sulfate as needed    Anemia, borderline low WBC count and thrombocytopenia - Hb 15.3 in 1/12- 11 on admission- now 9.2 on the day of discharge. Transfuse for hemoglobin less than 9 - likely due to MM (if that is indeed the diagnosis)  Transfuse when necessary to keep the hemoglobin close to/above 9given his significant cardiac comorbidities as per hematology  Labs (Brief)          Component Value Date/Time   IRON 59 10/02/2016 1640   TIBC 479 (H) 10/02/2016 1640   FERRITIN 101 10/02/2016 1640   IRONPCTSAT 12 (L) 10/02/2016 1640           Essential hypertension Coreg- dose decreased from 18.75 to 12.5 mg due to hypotension    CAD S/P percutaneous coronary angioplasty with drug eluting stent in LAD  -- no longer on Aspirin at home - cont Plavix- stent was placed in Nov of last year - statin, Coreg  Acute on chronic sCHF- ischemic cardiomyopathy - EF ~25 % by LV Gram 10/17- no ECHO in system- obtained ECHO to assess EF- EF now 55-60% but likely has diastolic CHF althou not directly mentioned on ECHO- also has mod pulm , tricuspid, aortic regurg with dilated RV but normal RV function - mod L effusion, pedal edema and mild anasarca (on CT) on admission - holding Lasix due to hypotension occurring with fever - no ACE at this time due to AKI- on Lisinopril at home   Hypokalemia Repleted   Mildly elevated TN I - no chest pain- flat trend- due to AKI vs fluid overload  Mildly elevated d dimer -not hypoxic-  may be due to underlying cancer- already on Coumadin    Chronic atrial fibrillation  - CHA2DS2-VASc Score 4 - coreg (dose decreased due to hypotension), Coumadin- rate controlled Pharmacy managing   anticoagulation  Cirrhosis  - h/o Hep C  DM2 - holding Metformin due to renal failure- SSI for now      Discharge Exam:   Blood pressure 128/63, pulse 74, temperature 97.8 F (36.6 C), temperature source Oral, resp. rate 20, height _0  (1.778 m), weight 102.9 kg (226 lb 13.7 oz), SpO2 97 %.  General exam: Appears comfortable but quite sleepy HEENT: PERRLA, oral mucosa moist, no sclera icterus or thrush Respiratory system: Clear to auscultation. Respiratory effort normal. Cardiovascular system: S1 & S2 heard, RRR.  No murmurs  Gastrointestinal system: Abdomen soft, non-tender, nondistended. Normal bowel sound. No organomegaly Central nervous system: Alert and oriented. No focal neurological deficits. Extremities: No cyanosis, clubbing -  pedal edema improving Skin: No rashes or  ulcers Psychiatry:  Mood & affect appropriate.    Follow-up Information    Richard Mariscal, MD. Call.   Specialty:  Internal Medicine Why:  Hospital follow-up, check CMP, magnesium, CBC 12/22 Contact information: [place] [address] [phone]        Richard Lone, MD.  Call.   Specialties:  Hematology, Oncology Why:  Called to make follow-up appointment Contact information: [place] [address] [phone]           Signed: Reyne Dumas 10/08/2016, 10:42 AM        Time spent >45 mins

## 2016-10-08 NOTE — Progress Notes (Addendum)
Clinical Social Worker was asked to fax over additional information to [employer] by Mickel Baas [facility] worker). CSW faxed over progress note and new PT evaluation.. Waiting for approval through insurance before patient can discharge to SNF.   12:30pm Mickel Baas from [facility]) contacted CSW and gave patient approval number of [phone]. CSW contacted admissions coordinator Janie from [facility] and she stated patients wife needs to come into the facility before patient d/c from hospital to fill out paperwork. CSW relayed message to patients wife and she stated she will go to the facility at 3:30pm to start paper work process.   Rhea Pink, MSW,  [place]

## 2016-10-08 NOTE — Progress Notes (Signed)
ANTICOAGULATION CONSULT NOTE - Follow Up Consult  Pharmacy Consult for Coumadin Indication: atrial fibrillation  No Known Allergies  Patient Measurements: Height: 5\' 10"  (177.8 cm) Weight: 226 lb 13.7 oz (102.9 kg) IBW/kg (Calculated) : 73  Vital Signs: Temp: 97.8 F (36.6 C) (12/20 0544) Temp Source: Oral (12/20 0544) BP: 128/63 (12/20 0544) Pulse Rate: 74 (12/20 0544)  Labs:  Recent Labs  10/06/16 0553 10/07/16 0441 10/08/16 0234  HGB 8.8* 9.4* 9.2*  HCT 29.4* 30.7* 30.3*  PLT 73* 85* 84*  LABPROT 37.1* 36.8* 28.3*  INR 3.64 3.60 2.59  CREATININE 1.97* 1.80* 1.88*    Estimated Creatinine Clearance: 44.6 mL/min (by C-G formula based on SCr of 1.88 mg/dL (H)).  Assessment: 55y yoM on Coumadin PTA for afib - last dose 12/13. C/O lower back pain x 6 weeks, CT spine showed multiple lytic lesions concerning for metastatic disease. INR has been supratherapeutic though coumadin has been on hold since admission on 12/13, which is likely d/t poor po intake and drug interaction with levaquin.   INR is down to 2.59 today. PO intake improving  Day # 5 Levaquin, could also be effecting INR decline.  30K/ml Enterococcus in urine.   PTA Coumadin dose: 6mg  daily. Last dose 10/01/16 (pta)  Goal of Therapy:  INR 2-3 Monitor platelets by anticoagulation protocol: Yes   Plan:  Coumadin 2mg  po x 1 Daily PT/INR. Continue Levaquin 750 mg IV q48hrs. Follow renal function for any need to adjust Levaquin dosing interval. Entered levaquin stop date 12/22  Maryanna Shape, PharmD, BCPS  Clinical Pharmacist  Pager: [phone]   10/08/2016

## 2016-10-09 LAB — CULTURE, BLOOD (ROUTINE X 2)
CULTURE: NO GROWTH
CULTURE: NO GROWTH

## 2016-10-09 LAB — PTH-RELATED PEPTIDE: PTH-related peptide: <1.1 pmol/L

## 2016-10-14 LAB — CHROMOSOME ANALYSIS, BONE MARROW

## 2016-10-14 LAB — TISSUE HYBRIDIZATION (BONE MARROW)-NCBH

## 2016-10-16 ENCOUNTER — Other Ambulatory Visit: Payer: Medicare HMO

## 2016-10-21 ENCOUNTER — Other Ambulatory Visit: Payer: Self-pay | Admitting: *Deleted

## 2016-10-21 DIAGNOSIS — C9000 Multiple myeloma not having achieved remission: Secondary | ICD-10-CM

## 2016-10-22 ENCOUNTER — Encounter (HOSPITAL_COMMUNITY): Payer: Self-pay

## 2016-10-22 ENCOUNTER — Other Ambulatory Visit: Payer: Self-pay | Admitting: *Deleted

## 2016-10-22 ENCOUNTER — Ambulatory Visit (HOSPITAL_BASED_OUTPATIENT_CLINIC_OR_DEPARTMENT_OTHER): Payer: Medicare HMO | Admitting: Hematology

## 2016-10-22 ENCOUNTER — Encounter: Payer: Self-pay | Admitting: Hematology

## 2016-10-22 ENCOUNTER — Other Ambulatory Visit (HOSPITAL_BASED_OUTPATIENT_CLINIC_OR_DEPARTMENT_OTHER): Payer: Medicare HMO

## 2016-10-22 ENCOUNTER — Ambulatory Visit (HOSPITAL_COMMUNITY)
Admission: RE | Admit: 2016-10-22 | Discharge: 2016-10-22 | Disposition: A | Discharge status: Home / Self Care | Payer: Medicare HMO | Source: Ambulatory Visit | Attending: Hematology | Admitting: Hematology

## 2016-10-22 VITALS — BP 97/56 | HR 73 | Temp 97.8°F | Resp 18 | Ht 70.0 in | Wt 222.7 lb

## 2016-10-22 DIAGNOSIS — D649 Anemia, unspecified: Secondary | ICD-10-CM | POA: Diagnosis not present

## 2016-10-22 DIAGNOSIS — E538 Deficiency of other specified B group vitamins: Secondary | ICD-10-CM

## 2016-10-22 DIAGNOSIS — C9000 Multiple myeloma not having achieved remission: Secondary | ICD-10-CM

## 2016-10-22 DIAGNOSIS — N189 Chronic kidney disease, unspecified: Secondary | ICD-10-CM | POA: Diagnosis not present

## 2016-10-22 LAB — CBC WITH DIFFERENTIAL/PLATELET
BASO%: 0.4 % (ref 0.0–2.0)
Basophils Absolute: 0 10*3/uL (ref 0.0–0.1)
EOS%: 1.2 % (ref 0.0–7.0)
Eosinophils Absolute: 0 10*3/uL (ref 0.0–0.5)
HCT: 26.9 % — ABNORMAL LOW (ref 38.4–49.9)
HEMOGLOBIN: 8.4 g/dL — AB (ref 13.0–17.1)
LYMPH#: 0.6 10*3/uL — AB (ref 0.9–3.3)
LYMPH%: 24.4 % (ref 14.0–49.0)
MCH: 30.2 pg (ref 27.2–33.4)
MCHC: 31.2 g/dL — ABNORMAL LOW (ref 32.0–36.0)
MCV: 96.8 fL (ref 79.3–98.0)
MONO#: 0.3 10*3/uL (ref 0.1–0.9)
MONO%: 12.4 % (ref 0.0–14.0)
NEUT#: 1.6 10*3/uL (ref 1.5–6.5)
NEUT%: 61.6 % (ref 39.0–75.0)
NRBC: 0 % (ref 0–0)
Platelets: 100 10*3/uL — ABNORMAL LOW (ref 140–400)
RBC: 2.78 10*6/uL — AB (ref 4.20–5.82)
RDW: 18.8 % — AB (ref 11.0–14.6)
WBC: 2.6 10*3/uL — AB (ref 4.0–10.3)

## 2016-10-22 LAB — COMPREHENSIVE METABOLIC PANEL
ALBUMIN: 3.7 g/dL (ref 3.5–5.0)
ALK PHOS: 203 U/L — AB (ref 40–150)
ALT: 9 U/L (ref 0–55)
AST: 22 U/L (ref 5–34)
Anion Gap: 12 mEq/L — ABNORMAL HIGH (ref 3–11)
BILIRUBIN TOTAL: 1.28 mg/dL — AB (ref 0.20–1.20)
BUN: 25.9 mg/dL (ref 7.0–26.0)
CO2: 23 meq/L (ref 22–29)
Calcium: 9.3 mg/dL (ref 8.4–10.4)
Chloride: 107 mEq/L (ref 98–109)
Creatinine: 1.5 mg/dL — ABNORMAL HIGH (ref 0.7–1.3)
EGFR: 46 mL/min/{1.73_m2} — AB (ref >90)
GLUCOSE: 115 mg/dL (ref 70–140)
POTASSIUM: 4.8 meq/L (ref 3.5–5.1)
SODIUM: 141 meq/L (ref 136–145)
TOTAL PROTEIN: 5.8 g/dL — AB (ref 6.4–8.3)

## 2016-10-22 LAB — TECHNOLOGIST REVIEW

## 2016-10-23 ENCOUNTER — Telehealth: Payer: Self-pay | Admitting: Hematology

## 2016-10-23 NOTE — Telephone Encounter (Signed)
lvm to inform pt of 1/8 appt date/time per LOS

## 2016-10-26 ENCOUNTER — Encounter (HOSPITAL_BASED_OUTPATIENT_CLINIC_OR_DEPARTMENT_OTHER): Payer: Self-pay | Admitting: Emergency Medicine

## 2016-10-26 DIAGNOSIS — Z79899 Other long term (current) drug therapy: Secondary | ICD-10-CM | POA: Diagnosis not present

## 2016-10-26 DIAGNOSIS — I10 Essential (primary) hypertension: Secondary | ICD-10-CM | POA: Insufficient documentation

## 2016-10-26 DIAGNOSIS — E119 Type 2 diabetes mellitus without complications: Secondary | ICD-10-CM | POA: Insufficient documentation

## 2016-10-26 DIAGNOSIS — H578 Other specified disorders of eye and adnexa: Secondary | ICD-10-CM | POA: Insufficient documentation

## 2016-10-26 DIAGNOSIS — I2510 Atherosclerotic heart disease of native coronary artery without angina pectoris: Secondary | ICD-10-CM | POA: Insufficient documentation

## 2016-10-26 NOTE — ED Triage Notes (Addendum)
Pt reports R eye pain. Pt noticed a cyst under the R eye lid today. Pt reports blurred vision in that eye. Pt with hx of multiple myeloma will be starting chemo tomorrow.

## 2016-10-27 ENCOUNTER — Emergency Department (HOSPITAL_BASED_OUTPATIENT_CLINIC_OR_DEPARTMENT_OTHER)
Admission: EM | Admit: 2016-10-27 | Discharge: 2016-10-27 | Disposition: A | Discharge status: Home / Self Care | Payer: Medicare HMO | Attending: Emergency Medicine | Admitting: Emergency Medicine

## 2016-10-27 ENCOUNTER — Encounter (HOSPITAL_BASED_OUTPATIENT_CLINIC_OR_DEPARTMENT_OTHER): Payer: Self-pay | Admitting: Emergency Medicine

## 2016-10-27 ENCOUNTER — Ambulatory Visit (HOSPITAL_BASED_OUTPATIENT_CLINIC_OR_DEPARTMENT_OTHER): Payer: Medicare HMO

## 2016-10-27 ENCOUNTER — Other Ambulatory Visit (HOSPITAL_BASED_OUTPATIENT_CLINIC_OR_DEPARTMENT_OTHER): Payer: Medicare HMO

## 2016-10-27 VITALS — BP 98/59 | HR 80 | Temp 98.0°F | Resp 18

## 2016-10-27 DIAGNOSIS — Z5112 Encounter for antineoplastic immunotherapy: Secondary | ICD-10-CM

## 2016-10-27 DIAGNOSIS — C9000 Multiple myeloma not having achieved remission: Secondary | ICD-10-CM

## 2016-10-27 DIAGNOSIS — H5789 Other specified disorders of eye and adnexa: Secondary | ICD-10-CM

## 2016-10-27 DIAGNOSIS — H579 Unspecified disorder of eye and adnexa: Secondary | ICD-10-CM

## 2016-10-27 HISTORY — DX: Multiple myeloma not having achieved remission: C90.00

## 2016-10-27 LAB — COMPREHENSIVE METABOLIC PANEL
ALT: 12 U/L (ref 0–55)
AST: 26 U/L (ref 5–34)
Albumin: 4.1 g/dL (ref 3.5–5.0)
Alkaline Phosphatase: 198 U/L — ABNORMAL HIGH (ref 40–150)
Anion Gap: 11 mEq/L (ref 3–11)
BILIRUBIN TOTAL: 1.5 mg/dL — AB (ref 0.20–1.20)
BUN: 29.9 mg/dL — ABNORMAL HIGH (ref 7.0–26.0)
CO2: 25 meq/L (ref 22–29)
Calcium: 9.9 mg/dL (ref 8.4–10.4)
Chloride: 105 mEq/L (ref 98–109)
Creatinine: 1.5 mg/dL — ABNORMAL HIGH (ref 0.7–1.3)
EGFR: 46 mL/min/{1.73_m2} — AB (ref >90)
GLUCOSE: 125 mg/dL (ref 70–140)
POTASSIUM: 4.9 meq/L (ref 3.5–5.1)
SODIUM: 140 meq/L (ref 136–145)
TOTAL PROTEIN: 6.5 g/dL (ref 6.4–8.3)

## 2016-10-27 LAB — CBC WITH DIFFERENTIAL/PLATELET
BASO%: 0.4 % (ref 0.0–2.0)
Basophils Absolute: 0 10*3/uL (ref 0.0–0.1)
EOS ABS: 0 10*3/uL (ref 0.0–0.5)
EOS%: 1.1 % (ref 0.0–7.0)
HCT: 29.8 % — ABNORMAL LOW (ref 38.4–49.9)
HGB: 9.5 g/dL — ABNORMAL LOW (ref 13.0–17.1)
LYMPH%: 19.3 % (ref 14.0–49.0)
MCH: 31.1 pg (ref 27.2–33.4)
MCHC: 31.9 g/dL — ABNORMAL LOW (ref 32.0–36.0)
MCV: 97.7 fL (ref 79.3–98.0)
MONO#: 0.4 10*3/uL (ref 0.1–0.9)
MONO%: 14.7 % — AB (ref 0.0–14.0)
NEUT%: 64.5 % (ref 39.0–75.0)
NEUTROS ABS: 1.8 10*3/uL (ref 1.5–6.5)
Platelets: 114 10*3/uL — ABNORMAL LOW (ref 140–400)
RBC: 3.05 10*6/uL — AB (ref 4.20–5.82)
RDW: 18.4 % — ABNORMAL HIGH (ref 11.0–14.6)
WBC: 2.9 10*3/uL — AB (ref 4.0–10.3)
lymph#: 0.6 10*3/uL — ABNORMAL LOW (ref 0.9–3.3)

## 2016-10-27 MED ORDER — DEXAMETHASONE 4 MG PO TABS
40.0000 mg | ORAL_TABLET | Freq: Once | ORAL | Status: AC
  Administered 2016-10-27: 40 mg via ORAL

## 2016-10-27 MED ORDER — ONDANSETRON HCL 8 MG PO TABS
ORAL_TABLET | ORAL | Status: AC
  Filled 2016-10-27: qty 1

## 2016-10-27 MED ORDER — ACYCLOVIR 400 MG PO TABS
400.0000 mg | ORAL_TABLET | Freq: Two times a day (BID) | ORAL | 3 refills | Status: DC
Start: 2016-10-27 — End: 2017-02-24

## 2016-10-27 MED ORDER — ONDANSETRON HCL 8 MG PO TABS
8.0000 mg | ORAL_TABLET | Freq: Once | ORAL | Status: AC
  Administered 2016-10-27: 8 mg via ORAL

## 2016-10-27 MED ORDER — ONDANSETRON HCL 8 MG PO TABS: 8.0000 mg | ORAL_TABLET | Freq: Two times a day (BID) | ORAL | 1 refills | Status: DC | PRN

## 2016-10-27 MED ORDER — PROCHLORPERAZINE MALEATE 10 MG PO TABS: 10.0000 mg | ORAL_TABLET | Freq: Four times a day (QID) | ORAL | 1 refills | Status: DC | PRN

## 2016-10-27 MED ORDER — TETRACAINE HCL 0.5 % OP SOLN
2.0000 [drp] | Freq: Once | OPHTHALMIC | Status: AC
  Administered 2016-10-27: 2 [drp] via OPHTHALMIC
  Filled 2016-10-27: qty 4

## 2016-10-27 MED ORDER — ERYTHROMYCIN 5 MG/GM OP OINT
TOPICAL_OINTMENT | Freq: Four times a day (QID) | OPHTHALMIC | Status: DC
  Administered 2016-10-27: 03:00:00 via OPHTHALMIC
  Filled 2016-10-27: qty 3.5

## 2016-10-27 MED ORDER — FLUORESCEIN SODIUM 0.6 MG OP STRP
1.0000 | ORAL_STRIP | Freq: Once | OPHTHALMIC | Status: AC
  Administered 2016-10-27: 1 via OPHTHALMIC
  Filled 2016-10-27: qty 1

## 2016-10-27 MED ORDER — DEXAMETHASONE 4 MG PO TABS
ORAL_TABLET | ORAL | Status: AC
  Filled 2016-10-27: qty 10

## 2016-10-27 MED ORDER — BORTEZOMIB CHEMO SQ INJECTION 3.5 MG (2.5MG/ML)
1.3000 mg/m2 | Freq: Once | INTRAMUSCULAR | Status: AC
Start: 2016-10-27 — End: 2016-10-27
  Administered 2016-10-27: 3 mg via SUBCUTANEOUS
  Filled 2016-10-27: qty 3

## 2016-10-27 MED ORDER — DEXAMETHASONE 4 MG PO TABS: ORAL_TABLET | ORAL | 3 refills | Status: DC

## 2016-10-27 NOTE — Patient Instructions (Addendum)
[facility] Discharge Instructions for Patients Receiving Chemotherapy  Today you received the following chemotherapy agents Velcade  To help prevent nausea and vomiting after your treatment, we encourage you to take your nausea medication as directed.   If you develop nausea and vomiting that is not controlled by your nausea medication, call the clinic.   BELOW ARE SYMPTOMS THAT SHOULD BE REPORTED IMMEDIATELY:  *FEVER GREATER THAN 100.5 F  *CHILLS WITH OR WITHOUT FEVER  NAUSEA AND VOMITING THAT IS NOT CONTROLLED WITH YOUR NAUSEA MEDICATION  *UNUSUAL SHORTNESS OF BREATH  *UNUSUAL BRUISING OR BLEEDING  TENDERNESS IN MOUTH AND THROAT WITH OR WITHOUT PRESENCE OF ULCERS  *URINARY PROBLEMS  *BOWEL PROBLEMS  UNUSUAL RASH Items with * indicate a potential emergency and should be followed up as soon as possible.  Feel free to call the clinic you have any questions or concerns. The clinic phone number is [phone].  Please show the CHEMO ALERT CARD at check-in to the Emergency Department and triage nurse.    Bortezomib injection What is this medicine? BORTEZOMIB (bor TEZ oh mib) is a medicine that targets proteins in cancer cells and stops the cancer cells from growing. It is used to treat multiple myeloma and mantle-cell lymphoma. This medicine may be used for other purposes; ask your health care provider or pharmacist if you have questions. COMMON BRAND NAME(S): Velcade What should I tell my health care provider before I take this medicine? They need to know if you have any of these conditions: -diabetes -heart disease -irregular heartbeat -liver disease -on hemodialysis -low blood counts, like low white blood cells, platelets, or hemoglobin -peripheral neuropathy -taking medicine for blood pressure -an unusual or allergic reaction to bortezomib, mannitol, boron, other medicines, foods, dyes, or preservatives -pregnant or trying to get  pregnant -breast-feeding How should I use this medicine? This medicine is for injection into a vein or for injection under the skin. It is given by a health care professional in a hospital or clinic setting. Talk to your pediatrician regarding the use of this medicine in children. Special care may be needed. Overdosage: If you think you have taken too much of this medicine contact a poison control center or emergency room at once. NOTE: This medicine is only for you. Do not share this medicine with others. What if I miss a dose? It is important not to miss your dose. Call your doctor or health care professional if you are unable to keep an appointment. What may interact with this medicine? This medicine may interact with the following medications: -ketoconazole -rifampin -ritonavir -St. John's Wort This list may not describe all possible interactions. Give your health care provider a list of all the medicines, herbs, non-prescription drugs, or dietary supplements you use. Also tell them if you smoke, drink alcohol, or use illegal drugs. Some items may interact with your medicine. What should I watch for while using this medicine? You may get drowsy or dizzy. Do not drive, use machinery, or do anything that needs mental alertness until you know how this medicine affects you. Do not stand or sit up quickly, especially if you are an older patient. This reduces the risk of dizzy or fainting spells. In some cases, you may be given additional medicines to help with side effects. Follow all directions for their use. Call your doctor or health care professional for advice if you get a fever, chills or sore throat, or other symptoms of a cold or flu. Do not   treat yourself. This drug decreases your body's ability to fight infections. Try to avoid being around people who are sick. This medicine may increase your risk to bruise or bleed. Call your doctor or health care professional if you notice any unusual  bleeding. You may need blood work done while you are taking this medicine. In some patients, this medicine may cause a serious brain infection that may cause death. If you have any problems seeing, thinking, speaking, walking, or standing, tell your doctor right away. If you cannot reach your doctor, urgently seek other source of medical care. Check with your doctor or health care professional if you get an attack of severe diarrhea, nausea and vomiting, or if you sweat a lot. The loss of too much body fluid can make it dangerous for you to take this medicine. Do not become pregnant while taking this medicine or for at least 2 months after stopping it. Women should inform their doctor if they wish to become pregnant or think they might be pregnant. Men should not father a child while taking this medicine and for at least 2 months after stopping it. There is a potential for serious side effects to an unborn child. Talk to your health care professional or pharmacist for more information. Do not breast-feed an infant while taking this medicine or for 2 months after stopping it. This medicine may interfere with the ability to have a child. You should talk with your doctor or health care professional if you are concerned about your fertility. What side effects may I notice from receiving this medicine? Side effects that you should report to your doctor or health care professional as soon as possible: -allergic reactions like skin rash, itching or hives, swelling of the face, lips, or tongue -breathing problems -changes in hearing -changes in vision -fast, irregular heartbeat -feeling faint or lightheaded, falls -pain, tingling, numbness in the hands or feet -right upper belly pain -seizures -swelling of the ankles, feet, hands -unusual bleeding or bruising -unusually weak or tired -vomiting -yellowing of the eyes or skin Side effects that usually do not require medical attention (report to your  doctor or health care professional if they continue or are bothersome): -changes in emotions or moods -constipation -diarrhea -loss of appetite -headache -irritation at site where injected -nausea This list may not describe all possible side effects. Call your doctor for medical advice about side effects. You may report side effects to FDA at 1-800-FDA-1088. Where should I keep my medicine? This drug is given in a hospital or clinic and will not be stored at home. NOTE: This sheet is a summary. It may not cover all possible information. If you have questions about this medicine, talk to your doctor, pharmacist, or health care provider.  2017 Elsevier/Gold Standard (2016-04-02 18:30:39)   

## 2016-10-27 NOTE — ED Provider Notes (Addendum)
[facility] Provider Note: Georgena Spurling, MD, FACEP  CSN: [id number] MRN: [id number] ARRIVAL: 10/26/16 at 2113 ROOM: [place]  CRIMSON BEER is a 70 y.o. male recently diagnosed with multiple myeloma and beginning chemotherapy with Velcade later today. He has a history of a recently observed cystic structure of the upper outer aspect of the right globe. He is not aware of any trauma that may have triggered this. Since yesterday afternoon he has had a foreign body sensation in that area. He states it feels like something either scratches eye or something is in his eye. He rates the discomfort as an 8 out of 10 at its worst. He is irrigated his eye without relief. There is some associated erythema and watering.   Past Medical History:  Diagnosis Date  . Arthritis    "left shoulder" (07/31/2016)  . Coronary artery disease   . GERD (gastroesophageal reflux disease)   . Heart murmur   . High cholesterol   . Hypertension   . Multiple myeloma ([place])   . Sleep apnea    "probably; having test in November" (07/31/2016)  . Type II diabetes mellitus ([place])     Past Surgical History:  Procedure Laterality Date  . CARDIAC CATHETERIZATION N/A 07/31/2016   Procedure: Left Heart Cath and Coronary Angiography;  Surgeon: Lorretta Harp, MD;  Location: [facility];  Service: Cardiovascular;  Laterality: N/A;  . CARDIAC CATHETERIZATION N/A 07/31/2016   Procedure: Coronary Stent Intervention;  Surgeon: Lorretta Harp, MD;  Location: [facility];  Service: Cardiovascular;  Laterality: N/A;  . CORONARY ANGIOPLASTY    . SHOULDER SURGERY Left 1973   "put pin in it where it had separated"   . TONSILLECTOMY  ~ 1956    Family History  Problem Relation Age of Onset  . Hypertension Other     Social History  Substance Use Topics  . Smoking status: Never Smoker  . Smokeless tobacco: Never Used  . Alcohol use  Yes     Comment: 07/31/2016 "nothing since 2002"    Prior to Admission medications   Medication Sig Start Date End Date Taking? Authorizing Provider  atorvastatin (LIPITOR) 40 MG tablet Take 1 tablet (40 mg total) by mouth at bedtime. 08/01/16   Arbutus Leas, NP  bisacodyl (DULCOLAX) 5 MG EC tablet Take 1 tablet (5 mg total) by mouth daily as needed for moderate constipation. 10/08/16   Reyne Dumas, MD  carvedilol (COREG) 12.5 MG tablet Take 1 tablet (12.5 mg total) by mouth 2 (two) times daily with a meal. 10/08/16 11/07/16  Reyne Dumas, MD  Cholecalciferol (VITAMIN D3) 5000 units CAPS Take 1 capsule by mouth daily.     Historical Provider, MD  clopidogrel (PLAVIX) 75 MG tablet Take 1 tablet (75 mg total) by mouth daily with breakfast. 08/02/16   Arbutus Leas, NP  feeding supplement, ENSURE ENLIVE, (ENSURE ENLIVE) LIQD Take 237 mLs by mouth 2 (two) times daily between meals. 10/08/16   Reyne Dumas, MD  furosemide (LASIX) 40 MG tablet  08/19/16   Historical Provider, MD  insulin aspart (NOVOLOG) 100 UNIT/ML injection Correction coverage: Sensitive (thin, NPO, renal)  CBG < 70: implement hypoglycemia protocol  CBG 70 - 120: 0 units  CBG 121 - 150: 1 unit  CBG 151 - 200: 2 units  CBG 201 - 250: 3 units  CBG 251 - 300: 5 units  CBG 301 - 350: 7 units  CBG 351 - 400 9 units  CBG > 400 call MD and obtain STAT lab verification 10/08/16   Reyne Dumas, MD  levofloxacin (LEVAQUIN) 750 MG tablet Take 1 tablet (750 mg total) by mouth every other day. 10/08/16   Reyne Dumas, MD  morphine (MS CONTIN) 15 MG 12 hr tablet Take 1 tablet (15 mg total) by mouth every 12 (twelve) hours. 10/08/16   Reyne Dumas, MD  morphine (MSIR) 15 MG tablet Take 1 tablet (15 mg total) by mouth every 4 (four) hours as needed for severe pain. 10/08/16   Reyne Dumas, MD  omeprazole (PRILOSEC) 40 MG capsule  09/21/16   Historical Provider, MD  polyethylene glycol (MIRALAX / GLYCOLAX) packet Take 17 g by mouth daily.  10/08/16   Reyne Dumas, MD  potassium chloride SA (K-DUR,KLOR-CON) 20 MEQ tablet Take 20 mEq by mouth 2 (two) times daily.    Historical Provider, MD  warfarin (COUMADIN) 6 MG tablet Take 6 mg by mouth every morning.  06/05/16   Historical Provider, MD    Allergies Patient has no known allergies.   REVIEW OF SYSTEMS  Negative except as noted here or in the History of Present Illness.   PHYSICAL EXAMINATION  Initial Vital Signs Blood pressure 148/86, pulse 88, temperature 97.6 F (36.4 C), temperature source Oral, resp. rate 14, SpO2 100 %.  Examination General: Well-developed, well-nourished male in no acute distress; appearance consistent with age of record HENT: normocephalic; atraumatic Eyes: pupils equal, round and reactive to light; extraocular muscles intact; cystic mass of the upper outer aspect of the right globe without erythema; mild right conjunctival injection with increased tearing; no corneal abrasion seen on fluorescein examination; globes soft and non-tender; significant relief of discomfort with tetracaine and Neck: supple Heart: regular rate and rhythm Lungs: clear to auscultation bilaterally Abdomen: soft; nondistended Neurologic: Awake, alert and oriented; motor function intact in all extremities and symmetric; no facial droop Skin: Warm and dry Psychiatric: Normal mood and affect   RESULTS  Summary of this visit's results, reviewed by myself:   EKG Interpretation  Date/Time:    Ventricular Rate:    PR Interval:    QRS Duration:   QT Interval:    QTC Calculation:   R Axis:     Text Interpretation:        Laboratory Studies: No results found for this or any previous visit (from the past 24 hour(s)). Imaging Studies: No results found.  ED COURSE  Nursing notes and initial vitals signs, including pulse oximetry, reviewed.  Vitals:   10/26/16 2125 10/27/16 0056  BP: 125/86 148/86  Pulse: 86 88  Resp: 20 14  Temp: 97.7 F (36.5 C) 97.6 F  (36.4 C)  TempSrc: Oral Oral  SpO2: 97% 100%   We'll treat with erythromycin ointment for comfort. Will refer to ophthalmology for evaluation of his globe mass. He is artery on chronic narcotic treatment for his pain.  PROCEDURES    ED DIAGNOSES     ICD-9-CM ICD-10-CM   1. Swelling or mass of right eye 379.92 H57.8   2. Sensation of foreign body in eye 379.99 H57.9        Shanon Rosser, MD 10/27/16 1610    Shanon Rosser, MD 10/27/16 9604

## 2016-10-27 NOTE — Progress Notes (Signed)
Pt reports visit to ER 10/26/16 with findings of "cyst in right eye" and they instructed pt to see his eye doctor. Creatinine 1.5, bilirubin 1.5, Hemoglobin 9.5, per Dr. Irene Limbo okay to proceed with treatment pt to receive 40 mg decadron today since he did not have it to take at [facility], and no need for blood transfusion at this time. Prescriptions sent to patients pharmacy, pt educated to notify clinic if he experiences any adverse effects from the decadron. pt aware, and verbalizes understanding.

## 2016-10-27 NOTE — ED Notes (Signed)
Pt stated he would prefer to stay in the wheelchair.

## 2016-10-27 NOTE — Progress Notes (Signed)
Pt tolerated Velcade injection well. Pt monitored 30 minutes post injection. Pt and spouse educated on discharge instructions and printed instructions given to patient. Pt and spouse verbalizes understanding.

## 2016-10-29 NOTE — Progress Notes (Signed)
[name]    HEMATOLOGY/ONCOLOGY CONSULTATION NOTE  Date of Service: 10/29/2016  Patient Care Team: Sandi Mariscal, MD as PCP - General (Internal Medicine)  CHIEF COMPLAINTS/PURPOSE OF CONSULTATION:  Newly diagnosed Multiple Myeloma  HISTORY OF PRESENTING ILLNESS:   Richard Vang is a wonderful 71 y.o. male who is here for his post-hospitalization followup for continued management of newly diagnosed light chain multiple myeloma.  Patient has a history of hypertension, dyslipidemia, sleep apnea, diabetes, coronary artery disease status post drug-eluting PCI in the proximal LAD on 07/31/2016 on dual antiplatelet therapy and Coumadin, systolic heart failure with ejection fraction of 25-35% on cath in October 2017. Patient presented to his primary care physician with increasing shortness of breath and was referred to his cardiologist who eventually referred him to the emergency room for further evaluation and management.  EKG showed atrial fibrillation with PVCs. Patient reported increasing your back pain over the last 6 weeks even at rest. He had labs which showed anemia, hypercalcemia with a calcium of more than 15 and acute on chronic renal failure. CT of the lumbar spine and abdomen showed possible diffuse lytic lesions in the spine, multiage indeterminate compression deformity most prominent at L4. Lytic lesion with aggressive features in the right leg bone concerning for malignancy. Patient was admitted for further evaluation and management.  Patient's hypercalcemia improved with IV bisphosphonates, saline diuresis and steroids. Bone marrow biopsy was consistent with multiple myeloma and showed 67% plasma cells. No M spike on SPEP that abnormal SFLC  consistent with light chain multiple myeloma.  He was also noted to have microcytic anemia with thrombocytopenia and was noted to have B12 deficiency which is being treated with vitamin B12 replacement.  He has multiple medical comorbidities including  cardiac and renal comorbidities that needed a fair amount of attention.  Patient in clinic today notes that he is much better than when he was in the hospital. Has some lower back pains. Appears to be mentally more clear but is still somewhat forgetful. No chest pain or acute shortness of breath. Starting to eat better.  He is keen to get started on treatment as soon as possible.  MEDICAL HISTORY:  Past Medical History:  Diagnosis Date  . Arthritis    "left shoulder" (07/31/2016)  . Coronary artery disease   . GERD (gastroesophageal reflux disease)   . Heart murmur   . High cholesterol   . Hypertension   . Multiple myeloma ([place])   . Sleep apnea    "probably; having test in November" (07/31/2016)  . Type II diabetes mellitus ([place])     SURGICAL HISTORY: Past Surgical History:  Procedure Laterality Date  . CARDIAC CATHETERIZATION N/A 07/31/2016   Procedure: Left Heart Cath and Coronary Angiography;  Surgeon: Lorretta Harp, MD;  Location: [facility];  Service: Cardiovascular;  Laterality: N/A;  . CARDIAC CATHETERIZATION N/A 07/31/2016   Procedure: Coronary Stent Intervention;  Surgeon: Lorretta Harp, MD;  Location: [place] [facility];  Service: Cardiovascular;  Laterality: N/A;  . CORONARY ANGIOPLASTY    . SHOULDER SURGERY Left 1973   "put pin in it where it had separated"   . TONSILLECTOMY  ~ 1956    SOCIAL HISTORY: Social History   Social History  . Marital status: Divorced    Spouse name: N/A  . Number of children: N/A  . Years of education: N/A   Occupational History  . Not on file.   Social History Main Topics  . Smoking status:  Never Smoker  . Smokeless tobacco: Never Used  . Alcohol use Yes     Comment: 07/31/2016 "nothing since 2002"  . Drug use: No  . Sexual activity: Not Currently   Other Topics Concern  . Not on file   Social History Narrative  . No narrative on file    FAMILY HISTORY: Family History  Problem Relation Age of Onset    . Hypertension Other     ALLERGIES:  has No Known Allergies.  MEDICATIONS:  Current Outpatient Prescriptions  Medication Sig Dispense Refill  . acyclovir (ZOVIRAX) 400 MG tablet Take 1 tablet (400 mg total) by mouth 2 (two) times daily. 60 tablet 3  . atorvastatin (LIPITOR) 40 MG tablet Take 1 tablet (40 mg total) by mouth at bedtime. 30 tablet 12  . bisacodyl (DULCOLAX) 5 MG EC tablet Take 1 tablet (5 mg total) by mouth daily as needed for moderate constipation. 30 tablet 0  . carvedilol (COREG) 12.5 MG tablet Take 1 tablet (12.5 mg total) by mouth 2 (two) times daily with a meal. 60 tablet 2  . Cholecalciferol (VITAMIN D3) 5000 units CAPS Take 1 capsule by mouth daily.     . clopidogrel (PLAVIX) 75 MG tablet Take 1 tablet (75 mg total) by mouth daily with breakfast. 30 tablet 12  . dexamethasone (DECADRON) 4 MG tablet Take 10 tablets (40 mg) on days 1, 8, and 15 of chemo. Repeat every 21 days. 30 tablet 3  . feeding supplement, ENSURE ENLIVE, (ENSURE ENLIVE) LIQD Take 237 mLs by mouth 2 (two) times daily between meals. 237 mL 12  . furosemide (LASIX) 40 MG tablet     . insulin aspart (NOVOLOG) 100 UNIT/ML injection Correction coverage: Sensitive (thin, NPO, renal)  CBG < 70: implement hypoglycemia protocol  CBG 70 - 120: 0 units  CBG 121 - 150: 1 unit  CBG 151 - 200: 2 units  CBG 201 - 250: 3 units  CBG 251 - 300: 5 units  CBG 301 - 350: 7 units  CBG 351 - 400 9 units  CBG > 400 call MD and obtain STAT lab verification 10 mL 11  . levofloxacin (LEVAQUIN) 750 MG tablet Take 1 tablet (750 mg total) by mouth every other day. 2 tablet 0  . morphine (MS CONTIN) 15 MG 12 hr tablet Take 1 tablet (15 mg total) by mouth every 12 (twelve) hours. 20 tablet 0  . morphine (MSIR) 15 MG tablet Take 1 tablet (15 mg total) by mouth every 4 (four) hours as needed for severe pain. 30 tablet 0  . omeprazole (PRILOSEC) 40 MG capsule     . ondansetron (ZOFRAN) 8 MG tablet Take 1 tablet (8 mg total) by  mouth 2 (two) times daily as needed for refractory nausea / vomiting. Starting on days 4 and 11 not on day 8. 30 tablet 1  . polyethylene glycol (MIRALAX / GLYCOLAX) packet Take 17 g by mouth daily. 14 each 0  . potassium chloride SA (K-DUR,KLOR-CON) 20 MEQ tablet Take 20 mEq by mouth 2 (two) times daily.    . prochlorperazine (COMPAZINE) 10 MG tablet Take 1 tablet (10 mg total) by mouth every 6 (six) hours as needed (Nausea or vomiting). 30 tablet 1  . warfarin (COUMADIN) 6 MG tablet Take 6 mg by mouth every morning.      No current facility-administered medications for this visit.     REVIEW OF SYSTEMS:    10 Point review of Systems was done is negative  except as noted above.  PHYSICAL EXAMINATION: ECOG PERFORMANCE STATUS: 3 - Symptomatic, >50% confined to bed  . Vitals:   10/22/16 1459  BP: (!) 97/56  Pulse: 73  Resp: 18  Temp: 97.8 F (36.6 C)   Filed Weights   10/22/16 1459  Weight: 222 lb 11.2 oz (101 kg)   .Body mass index is 31.95 kg/m.  GENERAL: NAD SKIN: petechiae over upper extremities OROPHARYNX: MMM  NECK: supple,+ JVD, LYMPH: no palpable lymphadenopathy in the cervical, axillary or inguinal LUNGS: Decreased entry bilaterally, few basal rales noted HEART: S1-S2 irreg ABDOMEN: abdomen soft, non-tender, normoactive bowel sounds, no hepatosplenomegaly palpable PSYCH: Alert and oriented 3 but somewhat forgetful and needs prompting by his wife. NEURO: non focal.   LABORATORY DATA:  I have reviewed the data as listed  . CBC Latest Ref Rng & Units 10/22/2016 10/08/2016  WBC 4.0 - 10.3 10e3/uL 2.6(L) 3.4(L)  Hemoglobin 13.0 - 17.1 g/dL 8.4(L) 9.2(L)  Hematocrit 38.4 - 49.9 % 26.9(L) 30.3(L)  Platelets 140 - 400 10e3/uL 100 Few Large platelets present(L) 84(L)    . CMP Latest Ref Rng & Units 10/22/2016 10/08/2016  Glucose 70 - 140 mg/dl 115 125(H)  BUN 7.0 - 26.0 mg/dL 25.9 35(H)  Creatinine 0.7 - 1.3 mg/dL 1.5(H) 1.88(H)  Sodium 136 - 145 mEq/L 141  146(H)  Potassium 3.5 - 5.1 mEq/L 4.8 3.9  Chloride 101 - 111 mmol/L - 115(H)  CO2 22 - 29 mEq/L 23 23  Calcium 8.4 - 10.4 mg/dL 9.3 8.7(L)  Total Protein 6.4 - 8.3 g/dL 5.8(L) 5.4(L)  Total Bilirubin 0.20 - 1.20 mg/dL 1.28(H) 1.6(H)  Alkaline Phos 40 - 150 U/L 203(H) 119  AST 5 - 34 U/L 22 47(H)  ALT 0 - 55 U/L 9 21   Component     Latest Ref Rng & Units 10/02/2016  IgG (Immunoglobin G), Serum     700 - 1,600 mg/dL 618 (L)  IgA     61 - 437 mg/dL 28 (L)  IgM, Serum     20 - 172 mg/dL <5 (L)  Total Protein ELP     6.0 - 8.5 g/dL 6.7  Albumin SerPl Elph-Mcnc     2.9 - 4.4 g/dL 3.8  Alpha 1     0.0 - 0.4 g/dL 0.4  Alpha2 Glob SerPl Elph-Mcnc     0.4 - 1.0 g/dL 0.8  B-Globulin SerPl Elph-Mcnc     0.7 - 1.3 g/dL 1.2  Gamma Glob SerPl Elph-Mcnc     0.4 - 1.8 g/dL 0.6  M Protein SerPl Elph-Mcnc     Not Observed g/dL Not Observed  Globulin, Total     2.2 - 3.9 g/dL 2.9  Albumin/Glob SerPl     0.7 - 1.7 1.4  IFE 1      Comment  Please Note (HCV):      Comment  Kappa free light chain     3.3 - 19.4 mg/L 700.0 (H)  Lamda free light chains     5.7 - 26.3 mg/L 7.4  Kappa, lamda light chain ratio     0.26 - 1.65 94.59 (H)  Beta-2 Microglobulin     0.6 - 2.4 mg/L 7.4 (H)  LDH     98 - 192 U/L 281 (H)  PSA     0.00 - 4.00 ng/mL 2.90             RADIOGRAPHIC STUDIES: I have personally reviewed the radiological images as listed and agreed with the findings  in the report. Ct Abdomen Pelvis Wo Contrast  Result Date: 10/02/2016 CLINICAL DATA:  71 year old male with abdominal pain and back pain x5 weeks. EXAM: CT ABDOMEN AND PELVIS WITHOUT CONTRAST TECHNIQUE: Multidetector CT imaging of the abdomen and pelvis was performed following the standard protocol without IV contrast. COMPARISON:  Abdominal CT dated 11/03/2010 FINDINGS: Evaluation of this exam is limited in the absence of intravenous contrast. Lower chest: Partially visualized moderate right pleural effusion with  associated partial compressive atelectasis of the right lower lobe. Superimposed pneumonia is not excluded. Clinical correlation is recommended. The pleural effusion is new since the study of 20/12 2012. There is coronary vascular calcification. Top-normal cardiac size. There is hypoattenuation of the cardiac blood pool suggestive of a degree of anemia. Clinical correlation is recommended. No intra-abdominal free air. There is diffuse mesenteric stranding and small ascites. Hepatobiliary: Cirrhosis. There is no intrahepatic biliary ductal dilatation. Stones noted within the gallbladder. The gallbladder is predominantly contracted. Mild haziness of the gallbladder wall likely related to cirrhosis and ascites. Ultrasound may provide better evaluation if there is clinical concern for acute cholecystitis. Pancreas: Unremarkable. No pancreatic ductal dilatation or surrounding inflammatory changes. Spleen: Normal in size without focal abnormality. Adrenals/Urinary Tract: The adrenal glands appear unremarkable. Multiple nonobstructing bilateral renal calculi measuring up to 4 mm. There is no hydronephrosis on either side. A 2 cm left renal inferior pole exophytic high attenuating lesions is incompletely characterized but most likely represents a complex/hemorrhagic cyst. Further evaluation with ultrasound is recommended. The visualized ureters and urinary bladder appear unremarkable. Stomach/Bowel: There is diverticulosis of the second portion of the duodenum at the head of the pancreas measuring up to 3.5 cm. No definite associated active inflammatory changes. Mild thickened appearance of the colonic folds may be related to underdistention or hepatic colopathy. Mild colitis is less likely but not entirely excluded. Correlation with clinical exam and stool cultures recommended. There is no evidence of bowel obstruction. Normal appendix. Vascular/Lymphatic: There is advanced aortoiliac atherosclerotic disease. There is no  aneurysmal dilatation. The IVC is grossly unremarkable. Evaluation of the vasculature is limited in the absence of intravenous contrast. No portal venous gas identified. There is no adenopathy. Reproductive: Mild enlargement of the prostate gland measuring up to 5.3 cm in transverse axial diameter. Other: Small fat containing right inguinal hernia. There is mild diffuse subcutaneous edema and anasarca. No fluid collection. Musculoskeletal: There is osteopenia with multilevel degenerative changes of the spine. Multilevel old-appearing compression deformity most prominent involving the L4 with approximately 30% loss of vertebral body height. No definite acute fracture. A 4.0 x 2.4 cm lytic lesion involving the right iliac bone demonstrates aggressive features suggest cortical breakthrough and is concerning for malignancy. Further evaluation with MRI is recommended. IMPRESSION: Cirrhosis with small ascites and mild anasarca. Cholelithiasis. Duodenal diverticulosis. Hepatic colopathy versus less likely mild colitis. Correlation with clinical exam and stool cultures recommended. No bowel obstruction. Normal appendix. Moderate right pleural effusion with partial compressive atelectasis of the right lower lobe. Lytic lesion in the right iliac bone with associated cortical breakthrough. MRI is recommended for further evaluation. Nonobstructing bilateral renal calculi.  No hydronephrosis. Left renal inferior pole high attenuating lesion, incompletely characterized, likely a complex/hemorrhagic cyst. Ultrasound is recommended for further characterization. Electronically Signed   By: Anner Crete M.D.   On: 10/02/2016 01:30   Dg Chest 1 View  Result Date: 10/03/2016 CLINICAL DATA:  Chest pain.  Right iliac crest biopsy in CT. EXAM: CHEST 1 VIEW COMPARISON:  10/02/2016 FINDINGS:  Cardiac enlargement. Progression of vascular congestion and bilateral edema. Progression of right effusion. Bibasilar atelectasis. Widening of  the superior mediastinum compatible with vascular ectasia. IMPRESSION: Congestive heart failure with progression of edema and right pleural effusion. Bibasilar atelectasis. Limited evaluation of skeletal structures. No displaced rib fracture identified. Electronically Signed   By: Franchot Gallo M.D.   On: 10/03/2016 11:19   Ct L-spine No Charge  Result Date: 10/02/2016 CLINICAL DATA:  71 year old male with back pain x5 weeks. EXAM: CT LUMBAR SPINE WITHOUT CONTRAST TECHNIQUE: Multidetector CT imaging of the lumbar spine was performed without intravenous contrast administration. Multiplanar CT image reconstructions were also generated. COMPARISON:  Abdominal CT dated 10/02/2016 and 11/03/2010 FINDINGS: Segmentation: 5 lumbar type vertebrae. Alignment: Normal. Vertebrae: There is advanced osteopenia suspicious for diffuse lytic metastatic process or underlying marrow abnormality or myeloma. Correlation with clinical exam and further evaluation with MRI or bone scan is recommended. Multilevel age indeterminate compression deformity involving T10, T11, T12, L3 and L4 noted. There is approximately 25% loss of vertebral body height at L4. An acute fracture involving L4 is not excluded. Evaluation is very limited due to advanced osteopenia. Correlation with clinical exam and point tenderness and further evaluation with MRI if clinically indicated recommended. No retropulsed fragment identified. There is endplate irregularity and Schmorl node involving the superior endplate of the L5. A lytic lesion with cortical breakthrough noted in the right iliac bone. Paraspinal and other soft tissues: Bilateral renal stones, aortoiliac atherosclerotic disease, and small ascites as seen on the CT. The paraspinal soft tissues appear unremarkable. Partially visualized right pleural effusion. Disc levels: Multilevel disc disease with endplate irregularity. IMPRESSION: Advanced osseous demineralization may be related to diffuse lytic  metastatic disease or myeloma. Correlation with clinical exam and further evaluation with MRI or bone scan recommended. Evaluation for acute fracture is very limited due to osteopenia. Multilevel age indeterminate compression deformity most prominent at L4. An acute fracture involving the L4 vertebra is not entirely excluded. Correlation with point tenderness recommended. No retropulsed fragment. Lytic lesion with aggressive features in the right iliac bone concerning for malignancy. Further evaluation with MRI or bone scan recommended. Partially visualized right pleural effusion. Electronically Signed   By: Anner Crete M.D.   On: 10/02/2016 03:41   Ct Biopsy  Result Date: 10/03/2016 INDICATION: 71 year old male with a history of possible multiple myeloma. EXAM: CT BIOPSY MEDICATIONS: None. ANESTHESIA/SEDATION: Moderate (conscious) sedation was employed during this procedure. A total of Versed 1.5 mg and Fentanyl 50 mcg was administered intravenously. Moderate Sedation Time: 21 minutes. The patient's level of consciousness and vital signs were monitored continuously by radiology nursing throughout the procedure under my direct supervision. FLUOROSCOPY TIME:  CT COMPLICATIONS: None PROCEDURE: The procedure risks, benefits, and alternatives were explained to the patient. Questions regarding the procedure were encouraged and answered. The patient understands and consents to the procedure. Patient was attempted to position into the prone position. He was unable to comfortably achieve this. When he was turning himself into a supine position, he experienced left-sided chest pain. Scout CT of the pelvis was performed for surgical planning purposes. The anterior pelvis was prepped with Betadinein a sterile fashion, and a sterile drape was applied covering the operative field. A sterile gown and sterile gloves were used for the procedure. Local anesthesia was provided with 1% Lidocaine. We targeted the right  anterior iliac bone for biopsy. The skin and subcutaneous tissues were infiltrated with 1% lidocaine without epinephrine. A small stab incision was made with  an 11 blade scalpel, and an 11 gauge Murphy needle was advanced with CT guidance to the posterior cortex. Manual forced was used to advance the needle through the posterior cortex and the stylet was removed. A bone marrow aspirate was retrieved and passed to a cytotechnologist in the room. The Murphy needle was then advanced without the stylet for a core biopsy. The core biopsy was retrieved and also passed to a cytotechnologist. Manual pressure was used for hemostasis and a sterile dressing was placed. No complications were encountered no significant blood loss was encountered. Patient tolerated the procedure well and remained hemodynamically stable throughout. IMPRESSION: Status post CT-guided bone marrow biopsy, with tissue specimen sent to pathology for complete histopathologic analysis Signed, Dulcy Fanny. Earleen Newport, DO [facility] PLAN: Given the patient's acute discomfort during repositioning on the table, a chest x-ray will be acquired to investigate for a possible left-sided rib fracture. Electronically Signed   By: Corrie Mckusick D.O.   On: 10/03/2016 11:24   Dg Chest Port 1 View  Result Date: 10/05/2016 CLINICAL DATA:  Hypoxia EXAM: PORTABLE CHEST 1 VIEW COMPARISON:  10/04/2016 FINDINGS: Cardiomegaly with vascular congestion. Improving interstitial opacities, likely improving interstitial edema. Mild edema persists. Small right pleural effusion noted. IMPRESSION: Mild CHF, slightly improved since prior study. Electronically Signed   By: Rolm Baptise M.D.   On: 10/05/2016 07:42   Dg Chest Port 1 View  Result Date: 10/04/2016 CLINICAL DATA:  Fever EXAM: PORTABLE CHEST 1 VIEW COMPARISON:  10/03/2016 FINDINGS: Cardiomegaly with vascular congestion. Diffuse interstitial prominence throughout the  lungs. More focal airspace opacity throughout the right lung with probable small right effusion. IMPRESSION: Cardiomegaly, possible mild interstitial edema. Focal opacities in the right could reflect pneumonia. Small right effusion. Electronically Signed   By: Rolm Baptise M.D.   On: 10/04/2016 08:19   Dg Chest Port 1 View  Result Date: 10/02/2016 CLINICAL DATA:  71 y/o  M; shortness of breath. EXAM: PORTABLE CHEST 1 VIEW COMPARISON:  11/03/2010 chest radiograph. FINDINGS: Cardiomegaly appears increased in comparison with prior radiographs given projection and technique. Prominent interstitial markings. Right basilar opacity obscures the right heart border. No definite pleural effusion. Bones are unremarkable. IMPRESSION: Increased cardiomegaly from prior radiographs may represent heart failure or pericardial effusion. Increased interstitial markings and opacity partially obscuring the right heart border probably represents pulmonary edema. Pneumonia is not excluded. Electronically Signed   By: Kristine Garbe M.D.   On: 10/02/2016 00:21   Dg Bone Survey Met  Result Date: 10/02/2016 CLINICAL DATA:  Low back pain for months, metastatic bone survey. Followup lytic lesion RIGHT iliac bone. Pending bone marrow biopsy for suspected multiple myeloma. EXAM: METASTATIC BONE SURVEY COMPARISON:  CT abdomen and pelvis/lumbar spine October 02, 2016 and CT chest November 03, 2010 FINDINGS: Skull:  No destructive bony lesions. RIGHT upper extremity: Small lytic lesion RIGHT acromion, not included on on prior chest CT. Widened scapholunate interval most compatible with old ligamentous injury associated with severe radiocarpal osteoarthrosis. Tiny lucency in the triquetrum bone. Moderate vascular calcifications. LEFT upper extremity: Surgical screw proximal LEFT humerus. No destructive bony lesions. Moderate vascular calcifications. Cervical spine: Advanced degenerative change without destructive bony lesions.  Symmetric vascular calcifications in the neck. Thoracic spine: Moderate chronic lower thoracic compression fractures. No destructive bony lesions. Lumbar spine: Old L4 mild to moderate compression fracture, mild L3 old compression fracture. No destructive bony lesions. Pelvis: No acute osseous process. Lytic lesion RIGHT iliac bone corresponding to today's  CT finding. RIGHT lower extremity: No destructive bony lesions or acute osseous process. LEFT lower extremity: Small lytic lesion proximal LEFT femur diaphysis involving the intramedullary cavity and cortex. No acute osseous process. Chest: Cardiomegaly. Pulmonary vascular congestion. RIGHT pleural effusion better seen on today's CT abdomen and pelvis. Generalized osteopenia. IMPRESSION: Lytic lesion RIGHT iliac bone corresponding to today's CT finding. Additional small lytic lesions RIGHT acromion, LEFT femur, RIGHT carpal bones, concerning for metastatic disease in the setting of suspected neoplasm. Osteopenia. Electronically Signed   By: Elon Alas M.D.   On: 10/02/2016 22:01    ASSESSMENT & PLAN:   71 year old male with multiple medical co-morbidities including hypertension, diabetes, dyslipidemia, coronary artery disease status post drug-eluting PCI on 07/31/2016 and newly noted possibly ischemic cardiomyopathy ejection fraction 25-35% (rpt ECHO improvement to 55-60%) with   1) Newly diagnosed Light chain Multiple myeloma with Lytic lesion with aggressive features in the right iliac bone and possibly other lytic lesions in the L spine , anemia, hypercalcemia and renal insuff prelim bone marrow bx -- shows 67%-80 plasma cells consistent with multiple myeloma. (Discussed with Dr. Tresa Moore) K/L 95, No M spike on SPEP -- suggests light chain MM Cytogenetics - normal male chromosomes FISH- +11 and 13q-/-13  2) severe hypercalcemia calcium a more than 15 now down to normal 9.4 This is likely due to MM resolved with bisphosphonates, forced  salien diuresis and Steroids in the hospital  3) CAD s/p prox LAD DES PCI on 07/31/2016 with ischemic cardiomyopathy ejection fraction of 25-35%. Rpt ECHO on 10/03/2016 shows improvement in EF to 55-60% with no RWMABN.  4) Macrocytic Anemia with moderate thrombocytopenia - due to MM and B12 def hgb 8.4 -consent for possible PRBC transfusion given cardiac co-morbidities.  5) AKI on CKD -- related to hypercalcemia vs ?cardiorenal syndrome vs MM - improved. Appears to currently be at  6) B12 deficiency - Received [place] B12 daily while in the hopsital -cont SL B12 1060mg po daily on discharge.  PLAN -I discussed the diagnosis, prognosis, treatment options in detail with the patient and his wife and all her questions were answered in details. -We are treating the patient with Velcade /Dexamethasone and if he tolerates will change to CyBorD after cycle 1 -will need monthly IV Pamidronate -Transfuse when necessary to keep the hemoglobin close to/above 9given his significant cardiac comorbidities.  .[name]) CAD s/p recent DES 8) Afib with RVR on coumadin -continue mx per PCP and cardiology   RTC with Dr KIrene Limboon 11/03/2016 with rpt labs.  All of the patients questions were answered with apparent satisfaction. The patient knows to call the clinic with any problems, questions or concerns.  I spent 30 minutes counseling the patient face to face. The total time spent in the appointment was 40 minutes and more than 50% was on counseling and direct patient cares.    GSullivan LoneMD MBenldAAHIVMS SBaptist Health Surgery Center At Bethesda WestCP H S [facility]-Quentin N BurdickHematology/Oncology Physician CAdvanced Care [facility] (Office):       [phone](Work cell):  [phone](Fax):           [phone]

## 2016-10-30 ENCOUNTER — Other Ambulatory Visit: Payer: Self-pay | Admitting: *Deleted

## 2016-10-30 ENCOUNTER — Encounter: Payer: Self-pay | Admitting: *Deleted

## 2016-10-30 ENCOUNTER — Other Ambulatory Visit: Payer: Medicare HMO

## 2016-10-30 ENCOUNTER — Other Ambulatory Visit (HOSPITAL_BASED_OUTPATIENT_CLINIC_OR_DEPARTMENT_OTHER): Payer: Medicare HMO

## 2016-10-30 ENCOUNTER — Ambulatory Visit (HOSPITAL_BASED_OUTPATIENT_CLINIC_OR_DEPARTMENT_OTHER): Payer: Medicare HMO

## 2016-10-30 VITALS — BP 104/58 | HR 84 | Temp 97.8°F | Resp 18

## 2016-10-30 DIAGNOSIS — C9000 Multiple myeloma not having achieved remission: Secondary | ICD-10-CM | POA: Diagnosis not present

## 2016-10-30 DIAGNOSIS — Z5112 Encounter for antineoplastic immunotherapy: Secondary | ICD-10-CM

## 2016-10-30 LAB — COMPREHENSIVE METABOLIC PANEL
ALT: 20 U/L (ref 0–55)
ANION GAP: 12 meq/L — AB (ref 3–11)
AST: 34 U/L (ref 5–34)
Albumin: 4 g/dL (ref 3.5–5.0)
Alkaline Phosphatase: 196 U/L — ABNORMAL HIGH (ref 40–150)
BILIRUBIN TOTAL: 1.89 mg/dL — AB (ref 0.20–1.20)
BUN: 63.4 mg/dL — AB (ref 7.0–26.0)
CALCIUM: 9.3 mg/dL (ref 8.4–10.4)
CHLORIDE: 103 meq/L (ref 98–109)
CO2: 22 mEq/L (ref 22–29)
CREATININE: 1.9 mg/dL — AB (ref 0.7–1.3)
EGFR: 35 mL/min/{1.73_m2} — ABNORMAL LOW (ref >90)
Glucose: 124 mg/dl (ref 70–140)
Potassium: 5.4 mEq/L — ABNORMAL HIGH (ref 3.5–5.1)
Sodium: 137 mEq/L (ref 136–145)
TOTAL PROTEIN: 6.3 g/dL — AB (ref 6.4–8.3)

## 2016-10-30 LAB — CBC & DIFF AND RETIC
BASO%: 0 % (ref 0.0–2.0)
BASOS ABS: 0 10*3/uL (ref 0.0–0.1)
EOS ABS: 0 10*3/uL (ref 0.0–0.5)
EOS%: 0.2 % (ref 0.0–7.0)
HEMATOCRIT: 28.9 % — AB (ref 38.4–49.9)
HEMOGLOBIN: 9.1 g/dL — AB (ref 13.0–17.1)
IMMATURE RETIC FRACT: 24.4 % — AB (ref 3.00–10.60)
LYMPH#: 0.5 10*3/uL — AB (ref 0.9–3.3)
LYMPH%: 10.3 % — ABNORMAL LOW (ref 14.0–49.0)
MCH: 30.4 pg (ref 27.2–33.4)
MCHC: 31.5 g/dL — ABNORMAL LOW (ref 32.0–36.0)
MCV: 96.7 fL (ref 79.3–98.0)
MONO#: 0.7 10*3/uL (ref 0.1–0.9)
MONO%: 14.4 % — ABNORMAL HIGH (ref 0.0–14.0)
NEUT#: 3.5 10*3/uL (ref 1.5–6.5)
NEUT%: 75.1 % — AB (ref 39.0–75.0)
PLATELETS: 104 10*3/uL — AB (ref 140–400)
RBC: 2.99 10*6/uL — ABNORMAL LOW (ref 4.20–5.82)
RDW: 18.1 % — AB (ref 11.0–14.6)
RETIC %: 1.32 % (ref 0.80–1.80)
RETIC CT ABS: 39.47 10*3/uL (ref 34.80–93.90)
WBC: 4.7 10*3/uL (ref 4.0–10.3)

## 2016-10-30 LAB — URIC ACID: URIC ACID, SERUM: 8.9 mg/dL — AB (ref 2.6–7.4)

## 2016-10-30 MED ORDER — BORTEZOMIB CHEMO SQ INJECTION 3.5 MG (2.5MG/ML)
1.3000 mg/m2 | Freq: Once | INTRAMUSCULAR | Status: AC
  Administered 2016-10-30: 3 mg via SUBCUTANEOUS
  Filled 2016-10-30: qty 3

## 2016-10-30 MED ORDER — ONDANSETRON HCL 8 MG PO TABS
ORAL_TABLET | ORAL | Status: AC
  Filled 2016-10-30: qty 1

## 2016-10-30 MED ORDER — PROCHLORPERAZINE MALEATE 10 MG PO TABS
ORAL_TABLET | ORAL | Status: AC
  Filled 2016-10-30: qty 1

## 2016-10-30 MED ORDER — ONDANSETRON HCL 8 MG PO TABS
8.0000 mg | ORAL_TABLET | Freq: Once | ORAL | Status: AC
  Administered 2016-10-30: 8 mg via ORAL

## 2016-10-30 MED ORDER — PROCHLORPERAZINE MALEATE 10 MG PO TABS: 10.0000 mg | ORAL_TABLET | Freq: Once | ORAL | Status: DC

## 2016-10-30 NOTE — Progress Notes (Signed)
Per MD Irene Limbo, patient is Ok to treat with CBC/CMP. MD [facility] informed about intermittent SOB. MD Irene Limbo instructed patient to inform cardiologist of this SOB (due to recent stent placement). Obtain uric acid.

## 2016-10-30 NOTE — Progress Notes (Signed)
Pt reports increasing SHOB over the last 3 weeks that comes on suddenly and is resolved by lying down.  Pt states this also affects his swallowing.  Dr. Irene Limbo made aware and recommends pt to follow up with his cardiologist.  Pt states he feels it may be his reflux.  Pt advised by this RN to follow up with his cardiologist first.  Pt and spouse express understanding of instructions.

## 2016-10-30 NOTE — Patient Instructions (Signed)
[facility] Discharge Instructions for Patients Receiving Chemotherapy  Today you received the following chemotherapy agents:  Velcade  To help prevent nausea and vomiting after your treatment, we encourage you to take your nausea medication as prescribed.   If you develop nausea and vomiting that is not controlled by your nausea medication, call the clinic.   BELOW ARE SYMPTOMS THAT SHOULD BE REPORTED IMMEDIATELY:  *FEVER GREATER THAN 100.5 F  *CHILLS WITH OR WITHOUT FEVER  NAUSEA AND VOMITING THAT IS NOT CONTROLLED WITH YOUR NAUSEA MEDICATION  *UNUSUAL SHORTNESS OF BREATH  *UNUSUAL BRUISING OR BLEEDING  TENDERNESS IN MOUTH AND THROAT WITH OR WITHOUT PRESENCE OF ULCERS  *URINARY PROBLEMS  *BOWEL PROBLEMS  UNUSUAL RASH Items with * indicate a potential emergency and should be followed up as soon as possible.  Feel free to call the clinic you have any questions or concerns. The clinic phone number is [phone].  Please show the CHEMO ALERT CARD at check-in to the Emergency Department and triage nurse.   

## 2016-10-31 ENCOUNTER — Other Ambulatory Visit: Payer: Self-pay | Admitting: *Deleted

## 2016-10-31 DIAGNOSIS — C9000 Multiple myeloma not having achieved remission: Secondary | ICD-10-CM

## 2016-11-03 ENCOUNTER — Encounter: Payer: Self-pay | Admitting: Hematology

## 2016-11-03 ENCOUNTER — Ambulatory Visit (HOSPITAL_BASED_OUTPATIENT_CLINIC_OR_DEPARTMENT_OTHER): Payer: Medicare HMO | Admitting: Hematology

## 2016-11-03 ENCOUNTER — Other Ambulatory Visit (HOSPITAL_BASED_OUTPATIENT_CLINIC_OR_DEPARTMENT_OTHER): Payer: Medicare HMO

## 2016-11-03 ENCOUNTER — Ambulatory Visit (HOSPITAL_BASED_OUTPATIENT_CLINIC_OR_DEPARTMENT_OTHER): Payer: Medicare HMO

## 2016-11-03 VITALS — BP 126/69 | HR 85 | Temp 98.2°F | Resp 22 | Wt 216.3 lb

## 2016-11-03 DIAGNOSIS — N189 Chronic kidney disease, unspecified: Secondary | ICD-10-CM

## 2016-11-03 DIAGNOSIS — E119 Type 2 diabetes mellitus without complications: Secondary | ICD-10-CM

## 2016-11-03 DIAGNOSIS — C9000 Multiple myeloma not having achieved remission: Secondary | ICD-10-CM

## 2016-11-03 DIAGNOSIS — E875 Hyperkalemia: Secondary | ICD-10-CM | POA: Diagnosis not present

## 2016-11-03 DIAGNOSIS — D649 Anemia, unspecified: Secondary | ICD-10-CM

## 2016-11-03 DIAGNOSIS — I10 Essential (primary) hypertension: Secondary | ICD-10-CM

## 2016-11-03 DIAGNOSIS — Z5112 Encounter for antineoplastic immunotherapy: Secondary | ICD-10-CM | POA: Diagnosis not present

## 2016-11-03 LAB — COMPREHENSIVE METABOLIC PANEL
ALBUMIN: 3.7 g/dL (ref 3.5–5.0)
ALK PHOS: 202 U/L — AB (ref 40–150)
ALT: 20 U/L (ref 0–55)
ANION GAP: 11 meq/L (ref 3–11)
AST: 25 U/L (ref 5–34)
BUN: 60 mg/dL — AB (ref 7.0–26.0)
CHLORIDE: 106 meq/L (ref 98–109)
CO2: 24 mEq/L (ref 22–29)
Calcium: 9.3 mg/dL (ref 8.4–10.4)
Creatinine: 1.8 mg/dL — ABNORMAL HIGH (ref 0.7–1.3)
EGFR: 37 mL/min/{1.73_m2} — AB (ref >90)
Glucose: 157 mg/dl — ABNORMAL HIGH (ref 70–140)
Potassium: 5.5 mEq/L — ABNORMAL HIGH (ref 3.5–5.1)
Sodium: 141 mEq/L (ref 136–145)
Total Bilirubin: 1.53 mg/dL — ABNORMAL HIGH (ref 0.20–1.20)
Total Protein: 6.2 g/dL — ABNORMAL LOW (ref 6.4–8.3)

## 2016-11-03 LAB — CBC & DIFF AND RETIC
BASO%: 0 % (ref 0.0–2.0)
BASOS ABS: 0 10*3/uL (ref 0.0–0.1)
EOS ABS: 0 10*3/uL (ref 0.0–0.5)
EOS%: 0.3 % (ref 0.0–7.0)
HEMATOCRIT: 29.7 % — AB (ref 38.4–49.9)
HEMOGLOBIN: 9.4 g/dL — AB (ref 13.0–17.1)
Immature Retic Fract: 16.9 % — ABNORMAL HIGH (ref 3.00–10.60)
LYMPH%: 9.1 % — AB (ref 14.0–49.0)
MCH: 30.8 pg (ref 27.2–33.4)
MCHC: 31.6 g/dL — AB (ref 32.0–36.0)
MCV: 97.4 fL (ref 79.3–98.0)
MONO#: 0.2 10*3/uL (ref 0.1–0.9)
MONO%: 5.8 % (ref 0.0–14.0)
NEUT#: 3.1 10*3/uL (ref 1.5–6.5)
NEUT%: 84.8 % — ABNORMAL HIGH (ref 39.0–75.0)
PLATELETS: 94 10*3/uL — AB (ref 140–400)
RBC: 3.05 10*6/uL — ABNORMAL LOW (ref 4.20–5.82)
RDW: 18.5 % — AB (ref 11.0–14.6)
RETIC %: 1.56 % (ref 0.80–1.80)
Retic Ct Abs: 47.58 10*3/uL (ref 34.80–93.90)
WBC: 3.6 10*3/uL — ABNORMAL LOW (ref 4.0–10.3)
lymph#: 0.3 10*3/uL — ABNORMAL LOW (ref 0.9–3.3)

## 2016-11-03 LAB — URIC ACID: URIC ACID, SERUM: 8.9 mg/dL — AB (ref 2.6–7.4)

## 2016-11-03 MED ORDER — ONDANSETRON HCL 8 MG PO TABS
8.0000 mg | ORAL_TABLET | Freq: Once | ORAL | Status: AC
  Administered 2016-11-03: 8 mg via ORAL

## 2016-11-03 MED ORDER — BORTEZOMIB CHEMO SQ INJECTION 3.5 MG (2.5MG/ML)
1.3000 mg/m2 | Freq: Once | INTRAMUSCULAR | Status: AC
  Administered 2016-11-03: 3 mg via SUBCUTANEOUS
  Filled 2016-11-03: qty 3

## 2016-11-03 MED ORDER — ONDANSETRON HCL 8 MG PO TABS
ORAL_TABLET | ORAL | Status: AC
  Filled 2016-11-03: qty 1

## 2016-11-03 NOTE — Patient Instructions (Signed)
[facility] Discharge Instructions for Patients Receiving Chemotherapy  Today you received the following chemotherapy agents:  Velcade  To help prevent nausea and vomiting after your treatment, we encourage you to take your nausea medication as prescribed.   If you develop nausea and vomiting that is not controlled by your nausea medication, call the clinic.   BELOW ARE SYMPTOMS THAT SHOULD BE REPORTED IMMEDIATELY:  *FEVER GREATER THAN 100.5 F  *CHILLS WITH OR WITHOUT FEVER  NAUSEA AND VOMITING THAT IS NOT CONTROLLED WITH YOUR NAUSEA MEDICATION  *UNUSUAL SHORTNESS OF BREATH  *UNUSUAL BRUISING OR BLEEDING  TENDERNESS IN MOUTH AND THROAT WITH OR WITHOUT PRESENCE OF ULCERS  *URINARY PROBLEMS  *BOWEL PROBLEMS  UNUSUAL RASH Items with * indicate a potential emergency and should be followed up as soon as possible.  Feel free to call the clinic you have any questions or concerns. The clinic phone number is [phone].  Please show the CHEMO ALERT CARD at check-in to the Emergency Department and triage nurse.   

## 2016-11-03 NOTE — Progress Notes (Signed)
Per Dr. Irene Limbo, okay to treat with platelets 94, creatinine 1.8, bilirubin 1.53, potassium 5.5, and alk phos 202.  MD acknowledges reviewing all labs in detail.

## 2016-11-04 ENCOUNTER — Telehealth: Payer: Self-pay | Admitting: Hematology

## 2016-11-04 ENCOUNTER — Other Ambulatory Visit: Payer: Medicare HMO

## 2016-11-04 ENCOUNTER — Inpatient Hospital Stay: Payer: Medicare HMO

## 2016-11-04 NOTE — Telephone Encounter (Signed)
sw pt wife to confirm added infusion appt 1/19 at 930 am per LOS

## 2016-11-06 ENCOUNTER — Other Ambulatory Visit: Payer: Medicare HMO

## 2016-11-06 ENCOUNTER — Inpatient Hospital Stay: Payer: Medicare HMO

## 2016-11-07 ENCOUNTER — Ambulatory Visit (HOSPITAL_BASED_OUTPATIENT_CLINIC_OR_DEPARTMENT_OTHER): Payer: Medicare HMO

## 2016-11-07 ENCOUNTER — Other Ambulatory Visit: Payer: Medicare HMO

## 2016-11-07 ENCOUNTER — Inpatient Hospital Stay: Payer: Medicare HMO

## 2016-11-07 VITALS — BP 122/84 | HR 52 | Temp 98.2°F | Resp 20

## 2016-11-07 DIAGNOSIS — Z5112 Encounter for antineoplastic immunotherapy: Secondary | ICD-10-CM | POA: Diagnosis not present

## 2016-11-07 DIAGNOSIS — C9000 Multiple myeloma not having achieved remission: Secondary | ICD-10-CM

## 2016-11-07 LAB — BASIC METABOLIC PANEL
Anion Gap: 10 mEq/L (ref 3–11)
BUN: 51.3 mg/dL — AB (ref 7.0–26.0)
CALCIUM: 9 mg/dL (ref 8.4–10.4)
CHLORIDE: 110 meq/L — AB (ref 98–109)
CO2: 22 meq/L (ref 22–29)
CREATININE: 1.4 mg/dL — AB (ref 0.7–1.3)
EGFR: 50 mL/min/{1.73_m2} — ABNORMAL LOW (ref >90)
Glucose: 120 mg/dl (ref 70–140)
Potassium: 4.9 mEq/L (ref 3.5–5.1)
SODIUM: 142 meq/L (ref 136–145)

## 2016-11-07 MED ORDER — BORTEZOMIB CHEMO SQ INJECTION 3.5 MG (2.5MG/ML)
1.3000 mg/m2 | Freq: Once | INTRAMUSCULAR | Status: AC
  Administered 2016-11-07: 3 mg via SUBCUTANEOUS
  Filled 2016-11-07: qty 3

## 2016-11-07 MED ORDER — PROCHLORPERAZINE MALEATE 10 MG PO TABS: 10.0000 mg | ORAL_TABLET | Freq: Once | ORAL | Status: DC

## 2016-11-07 MED ORDER — ONDANSETRON HCL 8 MG PO TABS
8.0000 mg | ORAL_TABLET | Freq: Once | ORAL | Status: AC
  Administered 2016-11-07: 8 mg via ORAL

## 2016-11-07 MED ORDER — SODIUM CHLORIDE 0.9 % IV SOLN
Freq: Once | INTRAVENOUS | Status: AC
  Administered 2016-11-07: 10:00:00 via INTRAVENOUS

## 2016-11-07 MED ORDER — PAMIDRONATE DISODIUM 30 MG/10ML IV SOLN
60.0000 mg | Freq: Once | INTRAVENOUS | Status: DC
  Filled 2016-11-07: qty 20

## 2016-11-07 MED ORDER — SODIUM CHLORIDE 0.9 % IV SOLN
60.0000 mg | Freq: Once | INTRAVENOUS | Status: AC
  Administered 2016-11-07: 60 mg via INTRAVENOUS
  Filled 2016-11-07: qty 6.67

## 2016-11-07 MED ORDER — ONDANSETRON HCL 8 MG PO TABS
ORAL_TABLET | ORAL | Status: AC
  Filled 2016-11-07: qty 1

## 2016-11-07 NOTE — Patient Instructions (Signed)
[facility] Discharge Instructions for Patients Receiving Chemotherapy  Today you received the following chemotherapy agents:  Velcade  To help prevent nausea and vomiting after your treatment, we encourage you to take your nausea medication as prescribed.   If you develop nausea and vomiting that is not controlled by your nausea medication, call the clinic.   BELOW ARE SYMPTOMS THAT SHOULD BE REPORTED IMMEDIATELY:  *FEVER GREATER THAN 100.5 F  *CHILLS WITH OR WITHOUT FEVER  NAUSEA AND VOMITING THAT IS NOT CONTROLLED WITH YOUR NAUSEA MEDICATION  *UNUSUAL SHORTNESS OF BREATH  *UNUSUAL BRUISING OR BLEEDING  TENDERNESS IN MOUTH AND THROAT WITH OR WITHOUT PRESENCE OF ULCERS  *URINARY PROBLEMS  *BOWEL PROBLEMS  UNUSUAL RASH Items with * indicate a potential emergency and should be followed up as soon as possible.  Feel free to call the clinic you have any questions or concerns. The clinic phone number is [phone].  Please show the CHEMO ALERT CARD at check-in to the Emergency Department and triage nurse.   

## 2016-11-07 NOTE — Progress Notes (Signed)
Dr. Irene Limbo okay to tx with K 5.5 and Ctn 1.8. Plan to have lab redraw CMET today to reevaluate potassium. Okay to change date of velcade treatment to today 11/07/16. Pt received zofran as pre-medication in the past for velcade.

## 2016-11-09 ENCOUNTER — Telehealth: Payer: Self-pay | Admitting: Hematology

## 2016-11-09 NOTE — Telephone Encounter (Signed)
Lvm advising md appt on 2/1. Appt start time has not changed.

## 2016-11-10 MED ORDER — DEXAMETHASONE 4 MG PO TABS: ORAL_TABLET | ORAL | 3 refills | Status: DC

## 2016-11-10 NOTE — Progress Notes (Signed)
[name]    HEMATOLOGY/ONCOLOGY CLINIC NOTE  Date of Service: .11/03/2016  Patient Care Team: Sandi Mariscal, MD as PCP - General (Internal Medicine)  CHIEF COMPLAINTS/PURPOSE OF CONSULTATION:  Newly diagnosed Multiple Myeloma  HISTORY OF PRESENTING ILLNESS:   Richard Vang is a wonderful 71 y.o. male who is here for his post-hospitalization followup for continued management of newly diagnosed light chain multiple myeloma.  Patient has a history of hypertension, dyslipidemia, sleep apnea, diabetes, coronary artery disease status post drug-eluting PCI in the proximal LAD on 07/31/2016 on dual antiplatelet therapy and Coumadin, systolic heart failure with ejection fraction of 25-35% on cath in October 2017. Patient presented to his primary care physician with increasing shortness of breath and was referred to his cardiologist who eventually referred him to the emergency room for further evaluation and management.  EKG showed atrial fibrillation with PVCs. Patient reported increasing your back pain over the last 6 weeks even at rest. He had labs which showed anemia, hypercalcemia with a calcium of more than 15 and acute on chronic renal failure. CT of the lumbar spine and abdomen showed possible diffuse lytic lesions in the spine, multiage indeterminate compression deformity most prominent at L4. Lytic lesion with aggressive features in the right leg bone concerning for malignancy. Patient was admitted for further evaluation and management.  Patient's hypercalcemia improved with IV bisphosphonates, saline diuresis and steroids. Bone marrow biopsy was consistent with multiple myeloma and showed 67% plasma cells. No M spike on SPEP that abnormal SFLC  consistent with light chain multiple myeloma.  He was also noted to have microcytic anemia with thrombocytopenia and was noted to have B12 deficiency which is being treated with vitamin B12 replacement.  He has multiple medical comorbidities including  cardiac and renal comorbidities that needed a fair amount of attention.  Patient in clinic today notes that he is much better than when he was in the hospital. Has some lower back pains. Appears to be mentally more clear but is still somewhat forgetful. No chest pain or acute shortness of breath. Starting to eat better.  He is keen to get started on treatment as soon as possible.  INTERVAL HISTORY  Richard Vang is here for his scheduled followup for a toxicity check. He notes significant lower extremity swelling with some weeping from the skin. Tolerated the Velcade without any issues. No other acute new concerns. Potassium levels were elevated and his PO potassium was held.  MEDICAL HISTORY:  Past Medical History:  Diagnosis Date  . Arthritis    "left shoulder" (07/31/2016)  . Coronary artery disease   . GERD (gastroesophageal reflux disease)   . Heart murmur   . High cholesterol   . Hypertension   . Multiple myeloma ([place])   . Sleep apnea    "probably; having test in November" (07/31/2016)  . Type II diabetes mellitus ([place])     SURGICAL HISTORY: Past Surgical History:  Procedure Laterality Date  . CARDIAC CATHETERIZATION N/A 07/31/2016   Procedure: Left Heart Cath and Coronary Angiography;  Surgeon: Lorretta Harp, MD;  Location: [facility];  Service: Cardiovascular;  Laterality: N/A;  . CARDIAC CATHETERIZATION N/A 07/31/2016   Procedure: Coronary Stent Intervention;  Surgeon: Lorretta Harp, MD;  Location: [facility];  Service: Cardiovascular;  Laterality: N/A;  . CORONARY ANGIOPLASTY    . SHOULDER SURGERY Left 1973   "put pin in it where it had separated"   . TONSILLECTOMY  ~ [place]  HISTORY: Social History   Social History  . Marital status: Divorced    Spouse name: N/A  . Number of children: N/A  . Years of education: N/A   Occupational History  . Not on file.   Social History Main Topics  . Smoking status: Never Smoker  . Smokeless  tobacco: Never Used  . Alcohol use Yes     Comment: 07/31/2016 "nothing since 2002"  . Drug use: No  . Sexual activity: Not Currently   Other Topics Concern  . Not on file   Social History Narrative  . No narrative on file    FAMILY HISTORY: Family History  Problem Relation Age of Onset  . Hypertension Other     ALLERGIES:  has No Known Allergies.  MEDICATIONS:  Current Outpatient Prescriptions  Medication Sig Dispense Refill  . acyclovir (ZOVIRAX) 400 MG tablet Take 1 tablet (400 mg total) by mouth 2 (two) times daily. 60 tablet 3  . atorvastatin (LIPITOR) 40 MG tablet Take 1 tablet (40 mg total) by mouth at bedtime. 30 tablet 12  . bisacodyl (DULCOLAX) 5 MG EC tablet Take 1 tablet (5 mg total) by mouth daily as needed for moderate constipation. 30 tablet 0  . carvedilol (COREG) 12.5 MG tablet Take 1 tablet (12.5 mg total) by mouth 2 (two) times daily with a meal. 60 tablet 2  . Cholecalciferol (VITAMIN D3) 5000 units CAPS Take 1 capsule by mouth daily.     . clopidogrel (PLAVIX) 75 MG tablet Take 1 tablet (75 mg total) by mouth daily with breakfast. 30 tablet 12  . dexamethasone (DECADRON) 4 MG tablet Take 10 tablets (40 mg) on days 1, 8, and 15 of chemo. Repeat every 21 days. 30 tablet 3  . feeding supplement, ENSURE ENLIVE, (ENSURE ENLIVE) LIQD Take 237 mLs by mouth 2 (two) times daily between meals. 237 mL 12  . furosemide (LASIX) 40 MG tablet     . insulin aspart (NOVOLOG) 100 UNIT/ML injection Correction coverage: Sensitive (thin, NPO, renal)  CBG < 70: implement hypoglycemia protocol  CBG 70 - 120: 0 units  CBG 121 - 150: 1 unit  CBG 151 - 200: 2 units  CBG 201 - 250: 3 units  CBG 251 - 300: 5 units  CBG 301 - 350: 7 units  CBG 351 - 400 9 units  CBG > 400 call MD and obtain STAT lab verification 10 mL 11  . levofloxacin (LEVAQUIN) 750 MG tablet Take 1 tablet (750 mg total) by mouth every other day. 2 tablet 0  . morphine (MS CONTIN) 15 MG 12 hr tablet Take 1  tablet (15 mg total) by mouth every 12 (twelve) hours. 20 tablet 0  . morphine (MSIR) 15 MG tablet Take 1 tablet (15 mg total) by mouth every 4 (four) hours as needed for severe pain. 30 tablet 0  . omeprazole (PRILOSEC) 40 MG capsule     . ondansetron (ZOFRAN) 8 MG tablet Take 1 tablet (8 mg total) by mouth 2 (two) times daily as needed for refractory nausea / vomiting. Starting on days 4 and 11 not on day 8. 30 tablet 1  . polyethylene glycol (MIRALAX / GLYCOLAX) packet Take 17 g by mouth daily. 14 each 0  . prochlorperazine (COMPAZINE) 10 MG tablet Take 1 tablet (10 mg total) by mouth every 6 (six) hours as needed (Nausea or vomiting). 30 tablet 1  . warfarin (COUMADIN) 6 MG tablet Take 6 mg by mouth every morning.  No current facility-administered medications for this visit.     REVIEW OF SYSTEMS:    10 Point review of Systems was done is negative except as noted above.  PHYSICAL EXAMINATION: ECOG PERFORMANCE STATUS: 3 - Symptomatic, >50% confined to bed  . Vitals:   11/03/16 1215  BP: 126/69  Pulse: 85  Resp: (!) 22  Temp: 98.2 F (36.8 C)   Filed Weights   11/03/16 1215  Weight: 216 lb 4.8 oz (98.1 kg)   .Body mass index is 31.04 kg/m.  GENERAL: NAD SKIN: petechiae over upper extremities OROPHARYNX: MMM  NECK: supple,+ JVD, LYMPH: no palpable lymphadenopathy in the cervical, axillary or inguinal LUNGS: Decreased entry bilaterally, few basal rales noted HEART: S1-S2 irreg ABDOMEN: abdomen soft, non-tender, normoactive bowel sounds, no hepatosplenomegaly palpable PSYCH: Alert and oriented 3 but somewhat forgetful and needs prompting by his wife. NEURO: non focal.   LABORATORY DATA:  I have reviewed the data as listed . CBC Latest Ref Rng & Units 11/03/2016 10/30/2016 10/27/2016  WBC 4.0 - 10.3 10e3/uL 3.6(L) 4.7 2.9(L)  Hemoglobin 13.0 - 17.1 g/dL 9.4(L) 9.1(L) 9.5(L)  Hematocrit 38.4 - 49.9 % 29.7(L) 28.9(L) 29.8(L)  Platelets 140 - 400 10e3/uL 94(L)  104(L) 114(L)   . CMP Latest Ref Rng & Units 11/07/2016 11/03/2016 10/30/2016  Glucose 70 - 140 mg/dl 120 157(H) 124  BUN 7.0 - 26.0 mg/dL 51.3(H) 60.0(H) 63.4(H)  Creatinine 0.7 - 1.3 mg/dL 1.4(H) 1.8(H) 1.9(H)  Sodium 136 - 145 mEq/L 142 141 137  Potassium 3.5 - 5.1 mEq/L 4.9 5.5 No visable hemolysis(H) 5.4(H)  Chloride 101 - 111 mmol/L - - -  CO2 22 - 29 mEq/L '22 24 22  ' Calcium 8.4 - 10.4 mg/dL 9.0 9.3 9.3  Total Protein 6.4 - 8.3 g/dL - 6.2(L) 6.3(L)  Total Bilirubin 0.20 - 1.20 mg/dL - 1.53(H) 1.89(H)  Alkaline Phos 40 - 150 U/L - 202(H) 196(H)  AST 5 - 34 U/L - 25 34  ALT 0 - 55 U/L - 20 20              RADIOGRAPHIC STUDIES: I have personally reviewed the radiological images as listed and agreed with the findings in the report. No results found.  ASSESSMENT & PLAN:   71 year old male with multiple medical co-morbidities including hypertension, diabetes, dyslipidemia, coronary artery disease status post drug-eluting PCI on 07/31/2016 and newly noted possibly ischemic cardiomyopathy ejection fraction 25-35% (rpt ECHO improvement to 55-60%) with   1) Newly diagnosed Light chain Multiple myeloma with Lytic lesion with aggressive features in the right iliac bone and possibly other lytic lesions in the L spine , anemia, hypercalcemia and renal insuff prelim bone marrow bx -- shows 67%-80 plasma cells consistent with multiple myeloma. (Discussed with Dr. Tresa Moore) K/L 95, No M spike on SPEP -- suggests light chain MM Cytogenetics - normal male chromosomes FISH- +11 and 13q-/-13  2) severe hypercalcemia calcium a more than 15 now down to normal 9 This is likely due to MM resolved with bisphosphonates, forced saline diuresis and Steroids in the hospital  3) CAD s/p prox LAD DES PCI on 07/31/2016 with ischemic cardiomyopathy ejection fraction of 25-35%. Rpt ECHO on 10/03/2016 shows improvement in EF to 55-60% with no RWMABN.  4) Macrocytic Anemia with moderate  thrombocytopenia - due to MM and B12 def hgb 9.4   5) AKI on CKD -- related to hypercalcemia vs ?cardiorenal syndrome vs MM - improved.   6) B12 deficiency - Received [place] B12 daily while  in the hopsital -cont SL B12 1053mg po daily on discharge.  PLAN -no prohibitive toxicities with Velcade -given his baseline leg swelling and leg weeping will reduce dexamethasone to 258mD1,8,15 -We are treating the patient with Velcade /Dexamethasone and if he tolerates will change to CyBorD after cycle 1 -will need monthly IV Pamidronate -Transfuse when necessary to keep the hemoglobin close to/above 9given his significant cardiac comorbidities.  6) b/l Lower extremity swelling likely due to CHF an CKD. Plan -continued adjustment and monitoring of diuretic therapy as per PCP and cardiology -will hold potassium due to hyperkalemia - normalized on rpt  .7) CAD s/p recent DES 8) Afib with RVR on coumadin -continue mx per PCP and cardiology   Continue treatment as per schedule Continue pamidronate every 4 weeks Return to clinic with Dr. KaLance SellContinue labs day 1 and 8 of treatment  All of the patients questions were answered with apparent satisfaction. The patient knows to call the clinic with any problems, questions or concerns.  I spent 25 minutes counseling the patient face to face. The total time spent in the appointment was 30 minutes and more than 50% was on counseling and direct patient cares.    GaSullivan LoneD MSFort DavisAHIVMS SCDoctor'S Hospital At Deer CreekTProvidence Tarzana Medical Centerematology/Oncology Physician CoKansas Medical Center LLC(Office):       [phone] cell):  [phone]):           [phone]

## 2016-11-11 ENCOUNTER — Other Ambulatory Visit: Payer: Medicare HMO

## 2016-11-11 ENCOUNTER — Inpatient Hospital Stay: Payer: Medicare HMO

## 2016-11-14 ENCOUNTER — Inpatient Hospital Stay: Payer: Medicare HMO

## 2016-11-14 ENCOUNTER — Other Ambulatory Visit: Payer: Medicare HMO

## 2016-11-17 ENCOUNTER — Other Ambulatory Visit: Payer: Self-pay | Admitting: *Deleted

## 2016-11-17 ENCOUNTER — Ambulatory Visit (HOSPITAL_BASED_OUTPATIENT_CLINIC_OR_DEPARTMENT_OTHER): Payer: Medicare HMO

## 2016-11-17 ENCOUNTER — Other Ambulatory Visit: Payer: Self-pay | Admitting: Hematology

## 2016-11-17 ENCOUNTER — Other Ambulatory Visit (HOSPITAL_BASED_OUTPATIENT_CLINIC_OR_DEPARTMENT_OTHER): Payer: Medicare HMO

## 2016-11-17 VITALS — BP 116/75 | HR 90 | Temp 98.9°F | Resp 18

## 2016-11-17 DIAGNOSIS — C9000 Multiple myeloma not having achieved remission: Secondary | ICD-10-CM | POA: Diagnosis not present

## 2016-11-17 DIAGNOSIS — Z5112 Encounter for antineoplastic immunotherapy: Secondary | ICD-10-CM | POA: Diagnosis not present

## 2016-11-17 LAB — COMPREHENSIVE METABOLIC PANEL
ALT: 31 U/L (ref 0–55)
ANION GAP: 9 meq/L (ref 3–11)
AST: 36 U/L — ABNORMAL HIGH (ref 5–34)
Albumin: 3.3 g/dL — ABNORMAL LOW (ref 3.5–5.0)
Alkaline Phosphatase: 208 U/L — ABNORMAL HIGH (ref 40–150)
BILIRUBIN TOTAL: 0.96 mg/dL (ref 0.20–1.20)
BUN: 37.3 mg/dL — AB (ref 7.0–26.0)
CO2: 23 mEq/L (ref 22–29)
CREATININE: 1.3 mg/dL (ref 0.7–1.3)
Calcium: 8.5 mg/dL (ref 8.4–10.4)
Chloride: 108 mEq/L (ref 98–109)
EGFR: 57 mL/min/{1.73_m2} — ABNORMAL LOW (ref >90)
Glucose: 144 mg/dl — ABNORMAL HIGH (ref 70–140)
Potassium: 4.7 mEq/L (ref 3.5–5.1)
Sodium: 140 mEq/L (ref 136–145)
TOTAL PROTEIN: 5.5 g/dL — AB (ref 6.4–8.3)

## 2016-11-17 LAB — CBC & DIFF AND RETIC
BASO%: 0.3 % (ref 0.0–2.0)
Basophils Absolute: 0 10*3/uL (ref 0.0–0.1)
EOS ABS: 0 10*3/uL (ref 0.0–0.5)
EOS%: 0.3 % (ref 0.0–7.0)
HCT: 27.8 % — ABNORMAL LOW (ref 38.4–49.9)
HGB: 8.7 g/dL — ABNORMAL LOW (ref 13.0–17.1)
IMMATURE RETIC FRACT: 8.8 % (ref 3.00–10.60)
LYMPH#: 0.2 10*3/uL — AB (ref 0.9–3.3)
LYMPH%: 6.2 % — ABNORMAL LOW (ref 14.0–49.0)
MCH: 30.6 pg (ref 27.2–33.4)
MCHC: 31.3 g/dL — ABNORMAL LOW (ref 32.0–36.0)
MCV: 97.9 fL (ref 79.3–98.0)
MONO#: 0.1 10*3/uL (ref 0.1–0.9)
MONO%: 2.6 % (ref 0.0–14.0)
NEUT#: 3.1 10*3/uL (ref 1.5–6.5)
NEUT%: 90.6 % — AB (ref 39.0–75.0)
PLATELETS: 101 10*3/uL — AB (ref 140–400)
RBC: 2.84 10*6/uL — AB (ref 4.20–5.82)
RDW: 18.7 % — ABNORMAL HIGH (ref 11.0–14.6)
RETIC CT ABS: 39.19 10*3/uL (ref 34.80–93.90)
Retic %: 1.38 % (ref 0.80–1.80)
WBC: 3.4 10*3/uL — ABNORMAL LOW (ref 4.0–10.3)

## 2016-11-17 MED ORDER — OXYCODONE HCL 5 MG PO TABS: 5.0000 mg | ORAL_TABLET | Freq: Four times a day (QID) | ORAL | 0 refills | Status: DC | PRN

## 2016-11-17 MED ORDER — CYCLOPHOSPHAMIDE 50 MG PO TABS: 200.0000 mg/m2 | ORAL_TABLET | ORAL | 1 refills | Status: DC

## 2016-11-17 MED ORDER — BORTEZOMIB CHEMO SQ INJECTION 3.5 MG (2.5MG/ML)
1.3000 mg/m2 | Freq: Once | INTRAMUSCULAR | Status: AC
  Administered 2016-11-17: 3 mg via SUBCUTANEOUS
  Filled 2016-11-17: qty 3

## 2016-11-17 MED ORDER — ONDANSETRON HCL 8 MG PO TABS
8.0000 mg | ORAL_TABLET | Freq: Once | ORAL | Status: AC
  Administered 2016-11-17: 8 mg via ORAL

## 2016-11-17 MED ORDER — ONDANSETRON HCL 8 MG PO TABS
ORAL_TABLET | ORAL | Status: AC
  Filled 2016-11-17: qty 1

## 2016-11-17 NOTE — Patient Instructions (Signed)
[facility] Discharge Instructions for Patients Receiving Chemotherapy  Today you received the following chemotherapy agents:  Velcade  To help prevent nausea and vomiting after your treatment, we encourage you to take your nausea medication as prescribed.   If you develop nausea and vomiting that is not controlled by your nausea medication, call the clinic.   BELOW ARE SYMPTOMS THAT SHOULD BE REPORTED IMMEDIATELY:  *FEVER GREATER THAN 100.5 F  *CHILLS WITH OR WITHOUT FEVER  NAUSEA AND VOMITING THAT IS NOT CONTROLLED WITH YOUR NAUSEA MEDICATION  *UNUSUAL SHORTNESS OF BREATH  *UNUSUAL BRUISING OR BLEEDING  TENDERNESS IN MOUTH AND THROAT WITH OR WITHOUT PRESENCE OF ULCERS  *URINARY PROBLEMS  *BOWEL PROBLEMS  UNUSUAL RASH Items with * indicate a potential emergency and should be followed up as soon as possible.  Feel free to call the clinic you have any questions or concerns. The clinic phone number is [phone].  Please show the CHEMO ALERT CARD at check-in to the Emergency Department and triage nurse.   

## 2016-11-18 ENCOUNTER — Telehealth: Payer: Self-pay | Admitting: Pharmacist

## 2016-11-18 ENCOUNTER — Other Ambulatory Visit: Payer: Self-pay | Admitting: *Deleted

## 2016-11-18 LAB — KAPPA/LAMBDA LIGHT CHAINS
IG KAPPA FREE LIGHT CHAIN: 203.6 mg/L — AB (ref 3.3–19.4)
Ig Lambda Free Light Chain: 7 mg/L (ref 5.7–26.3)
Kappa/Lambda FluidC Ratio: 29.09 — ABNORMAL HIGH (ref 0.26–1.65)

## 2016-11-18 MED ORDER — CYCLOPHOSPHAMIDE 50 MG PO TABS: 200.0000 mg/m2 | ORAL_TABLET | ORAL | 1 refills | Status: DC

## 2016-11-18 NOTE — Telephone Encounter (Signed)
Oral Chemotherapy Pharmacist Encounter  Received notification about new prescription for oral cyclophosphamide that has been e-scribed to [facility] to be taken in conjunction with bortezomib and dexamethasone.  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 Coumadin may need more frequent monitoring at the initiation and discontinuation of cyclophosphamide to maintain therapeutic INR's.  I called patient's PCP office Sandi Mariscal, MD at [phone]) to alert them patient is starting on cyclophosphamide of interaction since they are managing the patient's Coumadin (atrial fibrillation).  Prior authorization submitted on CoverMyMeds [place] Status is pending  Oral Oncology Clinic will continue to follow.  [name], PharmD, BCPS, BCOP 11/18/2016  8:41 AM Oral Oncology Clinic [phone]

## 2016-11-18 NOTE — Telephone Encounter (Signed)
Oral Chemotherapy Pharmacist Encounter  Received notification from [facility] that prior authorization of patient's cyclophosphamide has been DENIED under Part D benefits but has been APPROVED under Part B benefits.  I called [facility] to have them re-process prescription. They were unable to fill Rx.  Collaborative practice RN has now e-scribed cyclophosphamide prescription to [facility].  Oral Oncology Clinic will continue to follow.  [name], PharmD, BCPS, BCOP 11/18/2016  4:56 PM Oral Oncology Clinic [phone]

## 2016-11-20 ENCOUNTER — Encounter: Payer: Self-pay | Admitting: Hematology

## 2016-11-20 ENCOUNTER — Telehealth: Payer: Self-pay | Admitting: Hematology

## 2016-11-20 ENCOUNTER — Ambulatory Visit (HOSPITAL_BASED_OUTPATIENT_CLINIC_OR_DEPARTMENT_OTHER): Payer: Medicare HMO

## 2016-11-20 ENCOUNTER — Ambulatory Visit (HOSPITAL_BASED_OUTPATIENT_CLINIC_OR_DEPARTMENT_OTHER): Payer: Medicare HMO | Admitting: Hematology

## 2016-11-20 ENCOUNTER — Other Ambulatory Visit (HOSPITAL_BASED_OUTPATIENT_CLINIC_OR_DEPARTMENT_OTHER): Payer: Medicare HMO

## 2016-11-20 VITALS — BP 110/67 | HR 77 | Temp 98.5°F | Resp 16 | Ht 70.0 in | Wt 219.0 lb

## 2016-11-20 DIAGNOSIS — E119 Type 2 diabetes mellitus without complications: Secondary | ICD-10-CM

## 2016-11-20 DIAGNOSIS — C9000 Multiple myeloma not having achieved remission: Secondary | ICD-10-CM

## 2016-11-20 DIAGNOSIS — D649 Anemia, unspecified: Secondary | ICD-10-CM

## 2016-11-20 DIAGNOSIS — E538 Deficiency of other specified B group vitamins: Secondary | ICD-10-CM | POA: Diagnosis not present

## 2016-11-20 DIAGNOSIS — Z5112 Encounter for antineoplastic immunotherapy: Secondary | ICD-10-CM

## 2016-11-20 DIAGNOSIS — I10 Essential (primary) hypertension: Secondary | ICD-10-CM

## 2016-11-20 DIAGNOSIS — N189 Chronic kidney disease, unspecified: Secondary | ICD-10-CM

## 2016-11-20 LAB — CBC & DIFF AND RETIC
BASO%: 0.3 % (ref 0.0–2.0)
BASOS ABS: 0 10*3/uL (ref 0.0–0.1)
EOS ABS: 0 10*3/uL (ref 0.0–0.5)
EOS%: 1 % (ref 0.0–7.0)
HEMATOCRIT: 28.8 % — AB (ref 38.4–49.9)
HEMOGLOBIN: 9.7 g/dL — AB (ref 13.0–17.1)
IMMATURE RETIC FRACT: 7.7 % (ref 3.00–10.60)
LYMPH#: 0.5 10*3/uL — AB (ref 0.9–3.3)
LYMPH%: 15.5 % (ref 14.0–49.0)
MCH: 30.4 pg (ref 27.2–33.4)
MCHC: 33.7 g/dL (ref 32.0–36.0)
MCV: 90.3 fL (ref 79.3–98.0)
MONO#: 0.4 10*3/uL (ref 0.1–0.9)
MONO%: 12 % (ref 0.0–14.0)
NEUT#: 2.1 10*3/uL (ref 1.5–6.5)
NEUT%: 71.2 % (ref 39.0–75.0)
Platelets: 101 10*3/uL — ABNORMAL LOW (ref 140–400)
RBC: 3.19 10*6/uL — ABNORMAL LOW (ref 4.20–5.82)
RDW: 18.4 % — AB (ref 11.0–14.6)
RETIC %: 0.68 % — AB (ref 0.80–1.80)
RETIC CT ABS: 21.69 10*3/uL — AB (ref 34.80–93.90)
WBC: 2.9 10*3/uL — ABNORMAL LOW (ref 4.0–10.3)

## 2016-11-20 LAB — MULTIPLE MYELOMA PANEL, SERUM
Albumin SerPl Elph-Mcnc: 3.1 g/dL (ref 2.9–4.4)
Albumin/Glob SerPl: 1.5 (ref 0.7–1.7)
Alpha 1: 0.3 g/dL (ref 0.0–0.4)
Alpha2 Glob SerPl Elph-Mcnc: 0.6 g/dL (ref 0.4–1.0)
B-GLOBULIN SERPL ELPH-MCNC: 0.9 g/dL (ref 0.7–1.3)
GAMMA GLOB SERPL ELPH-MCNC: 0.4 g/dL (ref 0.4–1.8)
GLOBULIN, TOTAL: 2.1 g/dL — AB (ref 2.2–3.9)
IgA, Qn, Serum: 19 mg/dL — ABNORMAL LOW (ref 61–437)
IgG, Qn, Serum: 518 mg/dL — ABNORMAL LOW (ref 700–1600)
IgM, Qn, Serum: <5 mg/dL — ABNORMAL LOW (ref 20–172)
Total Protein: 5.2 g/dL — ABNORMAL LOW (ref 6.0–8.5)

## 2016-11-20 LAB — COMPREHENSIVE METABOLIC PANEL
ALBUMIN: 3.2 g/dL — AB (ref 3.5–5.0)
ALT: 32 U/L (ref 0–55)
AST: 26 U/L (ref 5–34)
Alkaline Phosphatase: 211 U/L — ABNORMAL HIGH (ref 40–150)
Anion Gap: 8 mEq/L (ref 3–11)
BILIRUBIN TOTAL: 1.13 mg/dL (ref 0.20–1.20)
BUN: 33.7 mg/dL — AB (ref 7.0–26.0)
CALCIUM: 9 mg/dL (ref 8.4–10.4)
CHLORIDE: 108 meq/L (ref 98–109)
CO2: 25 mEq/L (ref 22–29)
CREATININE: 1.1 mg/dL (ref 0.7–1.3)
EGFR: 69 mL/min/{1.73_m2} — ABNORMAL LOW (ref >90)
Glucose: 104 mg/dl (ref 70–140)
Potassium: 4.2 mEq/L (ref 3.5–5.1)
Sodium: 141 mEq/L (ref 136–145)
TOTAL PROTEIN: 5.5 g/dL — AB (ref 6.4–8.3)

## 2016-11-20 MED ORDER — B-12 1000 MCG SL SUBL: 1000.0000 ug | SUBLINGUAL_TABLET | Freq: Every day | SUBLINGUAL | 3 refills | Status: DC

## 2016-11-20 MED ORDER — ONDANSETRON HCL 8 MG PO TABS
ORAL_TABLET | ORAL | Status: AC
  Filled 2016-11-20: qty 1

## 2016-11-20 MED ORDER — BORTEZOMIB CHEMO SQ INJECTION 3.5 MG (2.5MG/ML)
1.3000 mg/m2 | Freq: Once | INTRAMUSCULAR | Status: AC
  Administered 2016-11-20: 3 mg via SUBCUTANEOUS
  Filled 2016-11-20: qty 3

## 2016-11-20 MED ORDER — ONDANSETRON HCL 8 MG PO TABS
8.0000 mg | ORAL_TABLET | Freq: Once | ORAL | Status: AC
  Administered 2016-11-20: 8 mg via ORAL

## 2016-11-20 NOTE — Patient Instructions (Signed)
[facility] Discharge Instructions for Patients Receiving Chemotherapy  Today you received the following chemotherapy agents Velcade. To help prevent nausea and vomiting after your treatment, we encourage you to take your nausea medication as directed.  If you develop nausea and vomiting that is not controlled by your nausea medication, call the clinic.   BELOW ARE SYMPTOMS THAT SHOULD BE REPORTED IMMEDIATELY:  *FEVER GREATER THAN 100.5 F  *CHILLS WITH OR WITHOUT FEVER  NAUSEA AND VOMITING THAT IS NOT CONTROLLED WITH YOUR NAUSEA MEDICATION  *UNUSUAL SHORTNESS OF BREATH  *UNUSUAL BRUISING OR BLEEDING  TENDERNESS IN MOUTH AND THROAT WITH OR WITHOUT PRESENCE OF ULCERS  *URINARY PROBLEMS  *BOWEL PROBLEMS  UNUSUAL RASH Items with * indicate a potential emergency and should be followed up as soon as possible.  Feel free to call the clinic you have any questions or concerns. The clinic phone number is [phone].  Please show the CHEMO ALERT CARD at check-in to the Emergency Department and triage nurse.    

## 2016-11-20 NOTE — Telephone Encounter (Signed)
Appointments complete per 2/1 los. Per desk nurse ok to combine q4w aredia w/2/19 velcade. Patient to get printout in infusion.

## 2016-11-20 NOTE — Progress Notes (Signed)
[name]    HEMATOLOGY/ONCOLOGY CLINIC NOTE  Date of Service: .11/20/2016  Patient Care Team: Sandi Mariscal, MD as PCP - General (Internal Medicine)  CHIEF COMPLAINTS/PURPOSE OF CONSULTATION:  Newly diagnosed Multiple Myeloma  HISTORY OF PRESENTING ILLNESS:   Richard Vang is a wonderful 71 y.o. male who is here for his post-hospitalization followup for continued management of newly diagnosed light chain multiple myeloma.  Patient has a history of hypertension, dyslipidemia, sleep apnea, diabetes, coronary artery disease status post drug-eluting PCI in the proximal LAD on 07/31/2016 on dual antiplatelet therapy and Coumadin, systolic heart failure with ejection fraction of 25-35% on cath in October 2017. Patient presented to his primary care physician with increasing shortness of breath and was referred to his cardiologist who eventually referred him to the emergency room for further evaluation and management.  EKG showed atrial fibrillation with PVCs. Patient reported increasing your back pain over the last 6 weeks even at rest. He had labs which showed anemia, hypercalcemia with a calcium of more than 15 and acute on chronic renal failure. CT of the lumbar spine and abdomen showed possible diffuse lytic lesions in the spine, multiage indeterminate compression deformity most prominent at L4. Lytic lesion with aggressive features in the right leg bone concerning for malignancy. Patient was admitted for further evaluation and management.  Patient's hypercalcemia improved with IV bisphosphonates, saline diuresis and steroids. Bone marrow biopsy was consistent with multiple myeloma and showed 67% plasma cells. No M spike on SPEP that abnormal SFLC  consistent with light chain multiple myeloma.  He was also noted to have microcytic anemia with thrombocytopenia and was noted to have B12 deficiency which is being treated with vitamin B12 replacement.  He has multiple medical comorbidities including  cardiac and renal comorbidities that needed a fair amount of attention.  Patient in clinic today notes that he is much better than when he was in the hospital. Has some lower back pains. Appears to be mentally more clear but is still somewhat forgetful. No chest pain or acute shortness of breath. Starting to eat better.  He is keen to get started on treatment as soon as possible.  INTERVAL HISTORY  [name] is here for his scheduled followup for myeloma. He has completed her 1st cycle of Vd without any prohibitive toxicities. Notes improvement in appetite and energy levels. Notes leg swelling improved and his leg ulcers are healing. Discussed pros vs cons of adding cyclophosphamide at a lower dose to optimize treatment. SFLC numbers improved renal function stable. Back pain improving.  MEDICAL HISTORY:  Past Medical History:  Diagnosis Date  . Arthritis    "left shoulder" (07/31/2016)  . Coronary artery disease   . GERD (gastroesophageal reflux disease)   . Heart murmur   . High cholesterol   . Hypertension   . Multiple myeloma ([name])   . Sleep apnea    "probably; having test in November" (07/31/2016)  . Type II diabetes mellitus ([name])     SURGICAL HISTORY: Past Surgical History:  Procedure Laterality Date  . CARDIAC CATHETERIZATION N/A 07/31/2016   Procedure: Left Heart Cath and Coronary Angiography;  Surgeon: Lorretta Harp, MD;  Location: [facility];  Service: Cardiovascular;  Laterality: N/A;  . CARDIAC CATHETERIZATION N/A 07/31/2016   Procedure: Coronary Stent Intervention;  Surgeon: Lorretta Harp, MD;  Location: [facility];  Service: Cardiovascular;  Laterality: N/A;  . CORONARY ANGIOPLASTY    . SHOULDER SURGERY Left 1973   "put pin  in it where it had separated"   . TONSILLECTOMY  ~ 1956    SOCIAL HISTORY: Social History   Social History  . Marital status: Divorced    Spouse name: N/A  . Number of children: N/A  . Years of education: N/A    Occupational History  . Not on file.   Social History Main Topics  . Smoking status: Never Smoker  . Smokeless tobacco: Never Used  . Alcohol use Yes     Comment: 07/31/2016 "nothing since 2002"  . Drug use: No  . Sexual activity: Not Currently   Other Topics Concern  . Not on file   Social History Narrative  . No narrative on file    FAMILY HISTORY: Family History  Problem Relation Age of Onset  . Hypertension Other     ALLERGIES:  has No Known Allergies.  MEDICATIONS:  Current Outpatient Prescriptions  Medication Sig Dispense Refill  . acyclovir (ZOVIRAX) 400 MG tablet Take 1 tablet (400 mg total) by mouth 2 (two) times daily. 60 tablet 3  . atorvastatin (LIPITOR) 40 MG tablet Take 1 tablet (40 mg total) by mouth at bedtime. 30 tablet 12  . bisacodyl (DULCOLAX) 5 MG EC tablet Take 1 tablet (5 mg total) by mouth daily as needed for moderate constipation. 30 tablet 0  . carvedilol (COREG) 12.5 MG tablet Take 1 tablet (12.5 mg total) by mouth 2 (two) times daily with a meal. 60 tablet 2  . Cholecalciferol (VITAMIN D3) 5000 units CAPS Take 1 capsule by mouth daily.     . clopidogrel (PLAVIX) 75 MG tablet Take 1 tablet (75 mg total) by mouth daily with breakfast. 30 tablet 12  . Cyanocobalamin (B-12) 1000 MCG SUBL Place 1,000 mcg under the tongue daily. 30 each 3  . cyclophosphamide (CYTOXAN) 50 MG tablet Take 9 tablets (450 mg total) by mouth once a week. Days 1, 8, and 15 of each cycle give on an empty stomach in AM. 27 tablet 1  . dexamethasone (DECADRON) 4 MG tablet Take 5 tablets (20 mg) on days 1, 8, and 15 of chemo. Repeat every 21 days. 30 tablet 3  . feeding supplement, ENSURE ENLIVE, (ENSURE ENLIVE) LIQD Take 237 mLs by mouth 2 (two) times daily between meals. 237 mL 12  . furosemide (LASIX) 40 MG tablet     . insulin aspart (NOVOLOG) 100 UNIT/ML injection Correction coverage: Sensitive (thin, NPO, renal)  CBG < 70: implement hypoglycemia protocol  CBG 70 - 120: 0  units  CBG 121 - 150: 1 unit  CBG 151 - 200: 2 units  CBG 201 - 250: 3 units  CBG 251 - 300: 5 units  CBG 301 - 350: 7 units  CBG 351 - 400 9 units  CBG > 400 call MD and obtain STAT lab verification 10 mL 11  . morphine (MS CONTIN) 15 MG 12 hr tablet Take 1 tablet (15 mg total) by mouth every 12 (twelve) hours. 20 tablet 0  . omeprazole (PRILOSEC) 40 MG capsule     . ondansetron (ZOFRAN) 8 MG tablet Take 1 tablet (8 mg total) by mouth 2 (two) times daily as needed for refractory nausea / vomiting. Starting on days 4 and 11 not on day 8. 30 tablet 1  . oxyCODONE (OXY IR/ROXICODONE) 5 MG immediate release tablet Take 1 tablet (5 mg total) by mouth every 6 (six) hours as needed for severe pain. 60 tablet 0  . polyethylene glycol (MIRALAX / GLYCOLAX) packet Take  17 g by mouth daily. 14 each 0  . prochlorperazine (COMPAZINE) 10 MG tablet Take 1 tablet (10 mg total) by mouth every 6 (six) hours as needed (Nausea or vomiting). 30 tablet 1  . warfarin (COUMADIN) 6 MG tablet Take 6 mg by mouth every morning.      No current facility-administered medications for this visit.     REVIEW OF SYSTEMS:    10 Point review of Systems was done is negative except as noted above.  PHYSICAL EXAMINATION: ECOG PERFORMANCE STATUS: 3 - Symptomatic, >50% confined to bed  . Vitals:   11/20/16 1011  BP: 110/67  Pulse: 77  Resp: 16  Temp: 98.5 F (36.9 C)   Filed Weights   11/20/16 1011  Weight: 219 lb (99.3 kg)   .Body mass index is 31.42 kg/m.  GENERAL: NAD SKIN: petechiae over upper extremities OROPHARYNX: MMM  NECK: supple,+ JVD, LYMPH: no palpable lymphadenopathy in the cervical, axillary or inguinal LUNGS: Decreased entry bilaterally, few basal rales noted HEART: S1-S2 irreg ABDOMEN: abdomen soft, non-tender, normoactive bowel sounds, no hepatosplenomegaly palpable PSYCH: Alert and oriented 3  NEURO: non focal. LE : b/l 1-2 + pedal edema .   LABORATORY DATA:  I have reviewed the  data as listed . CBC Latest Ref Rng & Units 11/20/2016 11/17/2016 11/03/2016  WBC 4.0 - 10.3 10e3/uL 2.9(L) 3.4(L) 3.6(L)  Hemoglobin 13.0 - 17.1 g/dL 9.7(L) 8.7(L) 9.4(L)  Hematocrit 38.4 - 49.9 % 28.8(L) 27.8(L) 29.7(L)  Platelets 140 - 400 10e3/uL 101(L) 101(L) 94(L)   . CMP Latest Ref Rng & Units 11/20/2016 11/17/2016 11/17/2016  Glucose 70 - 140 mg/dl 104 144(H) -  BUN 7.0 - 26.0 mg/dL 33.7(H) 37.3(H) -  Creatinine 0.7 - 1.3 mg/dL 1.1 1.3 -  Sodium 136 - 145 mEq/L 141 140 -  Potassium 3.5 - 5.1 mEq/L 4.2 4.7 -  Chloride 101 - 111 mmol/L - - -  CO2 22 - 29 mEq/L 25 23 -  Calcium 8.4 - 10.4 mg/dL 9.0 8.5 -  Total Protein 6.4 - 8.3 g/dL 5.5(L) 5.5(L) 5.2(L)  Total Bilirubin 0.20 - 1.20 mg/dL 1.13 0.96 -  Alkaline Phos 40 - 150 U/L 211(H) 208(H) -  AST 5 - 34 U/L 26 36(H) -  ALT 0 - 55 U/L 32 31 -   Component     Latest Ref Rng & Units 10/02/2016 11/17/2016  IgG (Immunoglobin G), Serum     700 - 1,600 mg/dL 618 (L) 518 (L)  IgA     61 - 437 mg/dL 28 (L)   IgM, Serum     20 - 172 mg/dL <5 (L)   Total Protein ELP     6.0 - 8.5 g/dL 6.7   Albumin SerPl Elph-Mcnc     2.9 - 4.4 g/dL 3.8 3.1  Alpha 1     0.0 - 0.4 g/dL 0.4 0.3  Alpha2 Glob SerPl Elph-Mcnc     0.4 - 1.0 g/dL 0.8 0.6  B-Globulin SerPl Elph-Mcnc     0.7 - 1.3 g/dL 1.2 0.9  Gamma Glob SerPl Elph-Mcnc     0.4 - 1.8 g/dL 0.6 0.4  M Protein SerPl Elph-Mcnc     Not Observed g/dL Not Observed Not Observed  Globulin, Total     2.2 - 3.9 g/dL 2.9 2.1 (L)  Albumin/Glob SerPl     0.7 - 1.7 1.4 1.5  IFE 1      Comment Comment  Please Note (HCV):      Comment Comment  IgA/Immunoglobulin A, Serum     61 - 437 mg/dL  19 (L)  IgM, Qn, Serum     20 - 172 mg/dL  <5 (L)  Total Protein     6.0 - 8.5 g/dL  5.2 (L)  Kappa free light chain     3.3 - 19.4 mg/L 700.0 (H)   Lamda free light chains     5.7 - 26.3 mg/L 7.4   Kappa, lamda light chain ratio     0.26 - 1.65 94.59 (H)   Ig Kappa Free Light Chain     3.3 - 19.4  mg/L  203.6 (H)  Ig Lambda Free Light Chain     5.7 - 26.3 mg/L  7.0  Kappa/Lambda FluidC Ratio     0.26 - 1.65  29.09 (H)              RADIOGRAPHIC STUDIES: I have personally reviewed the radiological images as listed and agreed with the findings in the report. No results found.  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 (Discussed with Dr. Tresa Moore) K/L 95, No M spike on SPEP -- suggests light chain MM Cytogenetics - normal male chromosomes FISH- +11 and 13q-/-13  Patient is s/p 1 cycle of Vd and Serum free kappa LC has decreased from 700 to 203.6 with improvement in his K/L ratio from 94.59 to 29.  2) severe hypercalcemia calcium a more than 15 now down to normal 9 This is likely due to MM resolved with bisphosphonates, forced saline diuresis and Steroids in the hospital  3) CAD s/p prox LAD DES PCI on 07/31/2016 with ischemic cardiomyopathy ejection fraction of 25-35%. Rpt ECHO on 10/03/2016 shows improvement in EF to 55-60% with no RWMABN.  4) Macrocytic Anemia with moderate thrombocytopenia - due to MM and B12 def hgb 9.7  5) AKI on CKD -- related to hypercalcemia vs ?cardiorenal syndrome vs MM - improved. Creatinine is improved  To 1.1  6) B12 deficiency - Received  B12 daily while in the hopsital -cont SL B12 1072mg po daily on discharge.  PLAN -no prohibitive toxicities with Velcade -given his baseline leg  swelling and leg weeping we have reduced dexamethasone to 237mD1,8,15 (has had some improvement in LE edema and leg ulcers) -on antiplatelet therapy  -on Acyclovir prophylaxis -will add Cyclophosphamide @ 20050m2 and adjust dose if tolerated. (given prescription) -continue q4weeks IV Pamidronate -Transfuse when necessary to keep the hemoglobin close to/above 9given his significant cardiac comorbidities.  6) b/l Lower extremity swelling likely due to CHF an CKD. Plan -continued adjustment and monitoring of diuretic therapy as per PCP and cardiology -will hold potassium due to hyperkalemia - normalized on rpt  .7) CAD s/p recent DES 8) Afib with RVR on coumadin -continue mx per PCP and cardiology   RTC with Dr KalIrene LimboD1 with labs (12/08/2016) Continue Labs D1 and D8 every cycle Continue Pamidronate q4weeks  All of the patients questions were answered with apparent satisfaction. The patient knows to call the clinic with any problems, questions  or concerns.  I spent 25 minutes counseling the patient face to face. The total time spent in the appointment was 30 minutes and more than 50% was on counseling and direct patient cares.    Sullivan Lone MD [place] AAHIVMS [facility] Hematology/Oncology Physician [facility]  (Office):       [phone] (Work cell):  [phone] (Fax):           [phone]

## 2016-11-20 NOTE — Patient Instructions (Signed)
-  please continue taking Vit B12 1000 micrograms daily (Over the counter) for B12 deficiency

## 2016-11-24 ENCOUNTER — Other Ambulatory Visit (HOSPITAL_BASED_OUTPATIENT_CLINIC_OR_DEPARTMENT_OTHER): Payer: Medicare HMO

## 2016-11-24 ENCOUNTER — Ambulatory Visit (HOSPITAL_BASED_OUTPATIENT_CLINIC_OR_DEPARTMENT_OTHER): Payer: Medicare HMO

## 2016-11-24 VITALS — BP 110/57 | HR 47 | Temp 98.0°F | Resp 18

## 2016-11-24 DIAGNOSIS — C9000 Multiple myeloma not having achieved remission: Secondary | ICD-10-CM

## 2016-11-24 DIAGNOSIS — Z5112 Encounter for antineoplastic immunotherapy: Secondary | ICD-10-CM

## 2016-11-24 LAB — COMPREHENSIVE METABOLIC PANEL
ALT: 22 U/L (ref 0–55)
ANION GAP: 8 meq/L (ref 3–11)
AST: 26 U/L (ref 5–34)
Albumin: 3.3 g/dL — ABNORMAL LOW (ref 3.5–5.0)
Alkaline Phosphatase: 198 U/L — ABNORMAL HIGH (ref 40–150)
BILIRUBIN TOTAL: 1 mg/dL (ref 0.20–1.20)
BUN: 31.2 mg/dL — ABNORMAL HIGH (ref 7.0–26.0)
CO2: 27 meq/L (ref 22–29)
CREATININE: 1.3 mg/dL (ref 0.7–1.3)
Calcium: 8.7 mg/dL (ref 8.4–10.4)
Chloride: 107 mEq/L (ref 98–109)
EGFR: 57 mL/min/{1.73_m2} — ABNORMAL LOW (ref >90)
Glucose: 98 mg/dl (ref 70–140)
Potassium: 4.3 mEq/L (ref 3.5–5.1)
Sodium: 142 mEq/L (ref 136–145)
TOTAL PROTEIN: 5.5 g/dL — AB (ref 6.4–8.3)

## 2016-11-24 LAB — CBC & DIFF AND RETIC
BASO%: 0 % (ref 0.0–2.0)
BASOS ABS: 0 10*3/uL (ref 0.0–0.1)
EOS ABS: 0 10*3/uL (ref 0.0–0.5)
EOS%: 0.5 % (ref 0.0–7.0)
HEMATOCRIT: 30.6 % — AB (ref 38.4–49.9)
HEMOGLOBIN: 9.6 g/dL — AB (ref 13.0–17.1)
IMMATURE RETIC FRACT: 20.8 % — AB (ref 3.00–10.60)
LYMPH#: 0.6 10*3/uL — AB (ref 0.9–3.3)
LYMPH%: 14.6 % (ref 14.0–49.0)
MCH: 30.6 pg (ref 27.2–33.4)
MCHC: 31.4 g/dL — ABNORMAL LOW (ref 32.0–36.0)
MCV: 97.5 fL (ref 79.3–98.0)
MONO#: 0.5 10*3/uL (ref 0.1–0.9)
MONO%: 13.3 % (ref 0.0–14.0)
NEUT#: 2.8 10*3/uL (ref 1.5–6.5)
NEUT%: 71.6 % (ref 39.0–75.0)
PLATELETS: 82 10*3/uL — AB (ref 140–400)
RBC: 3.14 10*6/uL — AB (ref 4.20–5.82)
RDW: 18.3 % — ABNORMAL HIGH (ref 11.0–14.6)
RETIC CT ABS: 44.27 10*3/uL (ref 34.80–93.90)
Retic %: 1.41 % (ref 0.80–1.80)
WBC: 3.9 10*3/uL — ABNORMAL LOW (ref 4.0–10.3)

## 2016-11-24 MED ORDER — BORTEZOMIB CHEMO SQ INJECTION 3.5 MG (2.5MG/ML)
1.3000 mg/m2 | Freq: Once | INTRAMUSCULAR | Status: AC
  Administered 2016-11-24: 3 mg via SUBCUTANEOUS
  Filled 2016-11-24: qty 3

## 2016-11-24 MED ORDER — DEXAMETHASONE 4 MG PO TABS
20.0000 mg | ORAL_TABLET | Freq: Once | ORAL | Status: AC
  Administered 2016-11-24: 20 mg via ORAL

## 2016-11-24 MED ORDER — ONDANSETRON HCL 8 MG PO TABS
8.0000 mg | ORAL_TABLET | Freq: Once | ORAL | Status: AC
  Administered 2016-11-24: 8 mg via ORAL

## 2016-11-24 MED ORDER — ONDANSETRON HCL 8 MG PO TABS
ORAL_TABLET | ORAL | Status: AC
  Filled 2016-11-24: qty 1

## 2016-11-24 MED ORDER — DEXAMETHASONE 4 MG PO TABS
ORAL_TABLET | ORAL | Status: AC
  Filled 2016-11-24: qty 5

## 2016-11-24 NOTE — Patient Instructions (Signed)
[facility] Discharge Instructions for Patients Receiving Chemotherapy  Today you received the following chemotherapy agents Velcade. To help prevent nausea and vomiting after your treatment, we encourage you to take your nausea medication as directed.  If you develop nausea and vomiting that is not controlled by your nausea medication, call the clinic.   BELOW ARE SYMPTOMS THAT SHOULD BE REPORTED IMMEDIATELY:  *FEVER GREATER THAN 100.5 F  *CHILLS WITH OR WITHOUT FEVER  NAUSEA AND VOMITING THAT IS NOT CONTROLLED WITH YOUR NAUSEA MEDICATION  *UNUSUAL SHORTNESS OF BREATH  *UNUSUAL BRUISING OR BLEEDING  TENDERNESS IN MOUTH AND THROAT WITH OR WITHOUT PRESENCE OF ULCERS  *URINARY PROBLEMS  *BOWEL PROBLEMS  UNUSUAL RASH Items with * indicate a potential emergency and should be followed up as soon as possible.  Feel free to call the clinic you have any questions or concerns. The clinic phone number is [phone].  Please show the CHEMO ALERT CARD at check-in to the Emergency Department and triage nurse.    

## 2016-11-24 NOTE — Progress Notes (Signed)
Platelets 82, Okay to treat per Dr. Irene Limbo.

## 2016-11-27 ENCOUNTER — Ambulatory Visit (HOSPITAL_BASED_OUTPATIENT_CLINIC_OR_DEPARTMENT_OTHER): Payer: Medicare HMO

## 2016-11-27 VITALS — BP 119/77 | HR 67 | Temp 97.0°F | Resp 20

## 2016-11-27 DIAGNOSIS — Z5112 Encounter for antineoplastic immunotherapy: Secondary | ICD-10-CM

## 2016-11-27 DIAGNOSIS — C9000 Multiple myeloma not having achieved remission: Secondary | ICD-10-CM

## 2016-11-27 MED ORDER — BORTEZOMIB CHEMO SQ INJECTION 3.5 MG (2.5MG/ML)
1.3000 mg/m2 | Freq: Once | INTRAMUSCULAR | Status: AC
  Administered 2016-11-27: 3 mg via SUBCUTANEOUS
  Filled 2016-11-27: qty 3

## 2016-11-27 MED ORDER — ONDANSETRON HCL 8 MG PO TABS
ORAL_TABLET | ORAL | Status: AC
  Filled 2016-11-27: qty 1

## 2016-11-27 MED ORDER — ONDANSETRON HCL 8 MG PO TABS
8.0000 mg | ORAL_TABLET | Freq: Once | ORAL | Status: AC
  Administered 2016-11-27: 8 mg via ORAL

## 2016-11-27 NOTE — Patient Instructions (Signed)
[facility] Discharge Instructions for Patients Receiving Chemotherapy  Today you received the following chemotherapy agents Velcade. To help prevent nausea and vomiting after your treatment, we encourage you to take your nausea medication as directed.  If you develop nausea and vomiting that is not controlled by your nausea medication, call the clinic.   BELOW ARE SYMPTOMS THAT SHOULD BE REPORTED IMMEDIATELY:  *FEVER GREATER THAN 100.5 F  *CHILLS WITH OR WITHOUT FEVER  NAUSEA AND VOMITING THAT IS NOT CONTROLLED WITH YOUR NAUSEA MEDICATION  *UNUSUAL SHORTNESS OF BREATH  *UNUSUAL BRUISING OR BLEEDING  TENDERNESS IN MOUTH AND THROAT WITH OR WITHOUT PRESENCE OF ULCERS  *URINARY PROBLEMS  *BOWEL PROBLEMS  UNUSUAL RASH Items with * indicate a potential emergency and should be followed up as soon as possible.  Feel free to call the clinic you have any questions or concerns. The clinic phone number is [phone].  Please show the CHEMO ALERT CARD at check-in to the Emergency Department and triage nurse.    

## 2016-12-01 ENCOUNTER — Other Ambulatory Visit: Payer: Self-pay | Admitting: *Deleted

## 2016-12-01 MED ORDER — OXYCODONE HCL ER 10 MG PO T12A: 10.0000 mg | EXTENDED_RELEASE_TABLET | Freq: Two times a day (BID) | ORAL | 0 refills | Status: DC

## 2016-12-02 ENCOUNTER — Telehealth: Payer: Self-pay | Admitting: *Deleted

## 2016-12-02 NOTE — Telephone Encounter (Signed)
Oral Chemotherapy Pharmacist Encounter  I called Richard Vang at [phone] to check on status of patient's cyclophosphamide prescription. I was informed this was delivered to patient's home on 11/25/16.  I called patient and spoke with his wife, Richard Vang, for overview of new oral chemotherapy medication: cyclophosphamide.  Start date: 11/25/16  Counseled patient on administration, dosing, side effects, safe handling, and monitoring.  Patient is taking 450mg  total (9 tablets) once per week on days 1, 8, and 15 of each 21 day cycle. Patient has missed 0 tablets so far.  Side effects include but not limited to: N/V/D, mucositis, and decreased blood counts.  Patient is experiencing extreme fatigue for the 2 days after taking his cyclophosphamide. Wife states that patient has a hard time even getting out of bed.  Patient has also been experiencing fairly constant nausea with a few episodes of emesis. Wife stated patient has lost 9 pounds in the past week. Wife stated that patient only has promethazine for nausea. I noted prescriptions for both prochlorperazine and ondansetron sent to pharmacy on 10/27/16. Wife will call pharmacy to have these scripts filled. They will try to get some control of the nausea by using these anti-emetics and will let the office know if these do not control [name] symptoms.  Richard Vang voiced understanding and appreciation.   All questions answered. Next appt with Dr. Irene Limbo confirmed for 2/19. Richard Vang knows to call the office sooner with questions or concerns.  Oral Oncology Clinic will continue to follow.  Thank you,  [name], PharmD, BCPS, BCOP 12/02/2016  2:29 PM Oral Oncology Clinic [phone]

## 2016-12-02 NOTE — Telephone Encounter (Signed)
Reviewed note from RPh with Dr. Irene Limbo regarding pt fatigue/nausea/wt loss.  Per Dr. Irene Limbo, LVM with pt stating he could come in to have labs drawn/be seen by MD if needed prior to [facility] already scheduled appointment.  Requested pt call back to verify.

## 2016-12-08 ENCOUNTER — Ambulatory Visit (HOSPITAL_BASED_OUTPATIENT_CLINIC_OR_DEPARTMENT_OTHER): Payer: Medicare HMO

## 2016-12-08 ENCOUNTER — Other Ambulatory Visit (HOSPITAL_BASED_OUTPATIENT_CLINIC_OR_DEPARTMENT_OTHER): Payer: Medicare HMO

## 2016-12-08 ENCOUNTER — Telehealth: Payer: Self-pay | Admitting: Hematology

## 2016-12-08 ENCOUNTER — Ambulatory Visit (HOSPITAL_BASED_OUTPATIENT_CLINIC_OR_DEPARTMENT_OTHER): Payer: Medicare HMO | Admitting: Hematology

## 2016-12-08 ENCOUNTER — Encounter: Payer: Self-pay | Admitting: Hematology

## 2016-12-08 VITALS — BP 111/64 | HR 64 | Temp 98.0°F | Resp 18 | Ht 70.0 in | Wt 200.3 lb

## 2016-12-08 DIAGNOSIS — C9000 Multiple myeloma not having achieved remission: Secondary | ICD-10-CM | POA: Diagnosis not present

## 2016-12-08 DIAGNOSIS — E119 Type 2 diabetes mellitus without complications: Secondary | ICD-10-CM | POA: Diagnosis not present

## 2016-12-08 DIAGNOSIS — N189 Chronic kidney disease, unspecified: Secondary | ICD-10-CM

## 2016-12-08 DIAGNOSIS — Z5112 Encounter for antineoplastic immunotherapy: Secondary | ICD-10-CM

## 2016-12-08 DIAGNOSIS — E538 Deficiency of other specified B group vitamins: Secondary | ICD-10-CM

## 2016-12-08 DIAGNOSIS — E876 Hypokalemia: Secondary | ICD-10-CM

## 2016-12-08 DIAGNOSIS — D649 Anemia, unspecified: Secondary | ICD-10-CM

## 2016-12-08 DIAGNOSIS — I10 Essential (primary) hypertension: Secondary | ICD-10-CM

## 2016-12-08 DIAGNOSIS — L89302 Pressure ulcer of unspecified buttock, stage 2: Secondary | ICD-10-CM

## 2016-12-08 LAB — CBC & DIFF AND RETIC
BASO%: 0.6 % (ref 0.0–2.0)
Basophils Absolute: 0 10*3/uL (ref 0.0–0.1)
EOS%: 0.6 % (ref 0.0–7.0)
Eosinophils Absolute: 0 10*3/uL (ref 0.0–0.5)
HCT: 30.4 % — ABNORMAL LOW (ref 38.4–49.9)
HGB: 9.9 g/dL — ABNORMAL LOW (ref 13.0–17.1)
IMMATURE RETIC FRACT: 11 % — AB (ref 3.00–10.60)
LYMPH#: 0.3 10*3/uL — AB (ref 0.9–3.3)
LYMPH%: 11 % — AB (ref 14.0–49.0)
MCH: 30.7 pg (ref 27.2–33.4)
MCHC: 32.6 g/dL (ref 32.0–36.0)
MCV: 94.4 fL (ref 79.3–98.0)
MONO#: 0.3 10*3/uL (ref 0.1–0.9)
MONO%: 9.1 % (ref 0.0–14.0)
NEUT%: 78.7 % — AB (ref 39.0–75.0)
NEUTROS ABS: 2.4 10*3/uL (ref 1.5–6.5)
PLATELETS: 194 10*3/uL (ref 140–400)
RBC: 3.22 10*6/uL — AB (ref 4.20–5.82)
RDW: 17.8 % — ABNORMAL HIGH (ref 11.0–14.6)
RETIC CT ABS: 54.74 10*3/uL (ref 34.80–93.90)
Retic %: 1.7 % (ref 0.80–1.80)
WBC: 3.1 10*3/uL — ABNORMAL LOW (ref 4.0–10.3)

## 2016-12-08 LAB — COMPREHENSIVE METABOLIC PANEL
ALBUMIN: 3.3 g/dL — AB (ref 3.5–5.0)
ALK PHOS: 187 U/L — AB (ref 40–150)
ALT: 14 U/L (ref 0–55)
AST: 17 U/L (ref 5–34)
Anion Gap: 10 mEq/L (ref 3–11)
BILIRUBIN TOTAL: 1.37 mg/dL — AB (ref 0.20–1.20)
BUN: 16.4 mg/dL (ref 7.0–26.0)
CALCIUM: 8.6 mg/dL (ref 8.4–10.4)
CO2: 22 mEq/L (ref 22–29)
CREATININE: 0.8 mg/dL (ref 0.7–1.3)
Chloride: 112 mEq/L — ABNORMAL HIGH (ref 98–109)
EGFR: 89 mL/min/{1.73_m2} — ABNORMAL LOW (ref >90)
Glucose: 119 mg/dl (ref 70–140)
POTASSIUM: 3 meq/L — AB (ref 3.5–5.1)
SODIUM: 144 meq/L (ref 136–145)
TOTAL PROTEIN: 5.5 g/dL — AB (ref 6.4–8.3)

## 2016-12-08 MED ORDER — SODIUM CHLORIDE 0.9 % IV SOLN
60.0000 mg | Freq: Once | INTRAVENOUS | Status: AC
  Administered 2016-12-08: 60 mg via INTRAVENOUS
  Filled 2016-12-08: qty 10

## 2016-12-08 MED ORDER — BORTEZOMIB CHEMO SQ INJECTION 3.5 MG (2.5MG/ML)
1.3000 mg/m2 | Freq: Once | INTRAMUSCULAR | Status: AC
  Administered 2016-12-08: 3 mg via SUBCUTANEOUS
  Filled 2016-12-08: qty 3

## 2016-12-08 MED ORDER — ONDANSETRON HCL 8 MG PO TABS
8.0000 mg | ORAL_TABLET | Freq: Once | ORAL | Status: AC
  Administered 2016-12-08: 8 mg via ORAL

## 2016-12-08 MED ORDER — ONDANSETRON HCL 8 MG PO TABS
ORAL_TABLET | ORAL | Status: AC
  Filled 2016-12-08: qty 1

## 2016-12-08 MED ORDER — SODIUM CHLORIDE 0.9 % IV SOLN
60.0000 mg | Freq: Once | INTRAVENOUS | Status: DC
  Filled 2016-12-08: qty 20

## 2016-12-08 MED ORDER — POTASSIUM CHLORIDE CRYS ER 20 MEQ PO TBCR: 20.0000 meq | EXTENDED_RELEASE_TABLET | Freq: Every day | ORAL | Status: DC

## 2016-12-08 MED ORDER — SODIUM CHLORIDE 0.9 % IV SOLN
Freq: Once | INTRAVENOUS | Status: AC
  Administered 2016-12-08: 12:00:00 via INTRAVENOUS

## 2016-12-08 MED ORDER — POTASSIUM CHLORIDE CRYS ER 20 MEQ PO TBCR
40.0000 meq | EXTENDED_RELEASE_TABLET | Freq: Once | ORAL | Status: DC
  Administered 2016-12-08: 40 meq via ORAL
  Filled 2016-12-08: qty 2

## 2016-12-08 NOTE — Telephone Encounter (Signed)
Appointments complete per 2/19 los - patient to get print out in infusion. Left message for wound center requesting appointment and asking that office contact patient. Referral in EPIC and patient is aware of the referral.

## 2016-12-08 NOTE — Patient Instructions (Addendum)
-  restart potassium 79meq orally twice daily for 2 days (starting this evening) and then 1 tab once daily after that.

## 2016-12-08 NOTE — Progress Notes (Signed)
[name]    HEMATOLOGY/ONCOLOGY CLINIC NOTE  Date of Service: .12/08/2016  Patient Care Team: Sandi Mariscal, MD as PCP - General (Internal Medicine)  CHIEF COMPLAINTS/PURPOSE OF CONSULTATION:  Newly diagnosed Multiple Myeloma  HISTORY OF PRESENTING ILLNESS:   Richard Vang is a wonderful 71 y.o. male who is here for his post-hospitalization followup for continued management of newly diagnosed light chain multiple myeloma.  Patient has a history of hypertension, dyslipidemia, sleep apnea, diabetes, coronary artery disease status post drug-eluting PCI in the proximal LAD on 07/31/2016 on dual antiplatelet therapy and Coumadin, systolic heart failure with ejection fraction of 25-35% on cath in October 2017. Patient presented to his primary care physician with increasing shortness of breath and was referred to his cardiologist who eventually referred him to the emergency room for further evaluation and management.  EKG showed atrial fibrillation with PVCs. Patient reported increasing your back pain over the last 6 weeks even at rest. He had labs which showed anemia, hypercalcemia with a calcium of more than 15 and acute on chronic renal failure. CT of the lumbar spine and abdomen showed possible diffuse lytic lesions in the spine, multiage indeterminate compression deformity most prominent at L4. Lytic lesion with aggressive features in the right leg bone concerning for malignancy. Patient was admitted for further evaluation and management.  Patient's hypercalcemia improved with IV bisphosphonates, saline diuresis and steroids. Bone marrow biopsy was consistent with multiple myeloma and showed 67% plasma cells. No M spike on SPEP that abnormal SFLC  consistent with light chain multiple myeloma.  He was also noted to have microcytic anemia with thrombocytopenia and was noted to have B12 deficiency which is being treated with vitamin B12 replacement.  He has multiple medical comorbidities including  cardiac and renal comorbidities that needed a fair amount of attention.  Patient in clinic today notes that he is much better than when he was in the hospital. Has some lower back pains. Appears to be mentally more clear but is still somewhat forgetful. No chest pain or acute shortness of breath. Starting to eat better.  He is keen to get started on treatment as soon as possible.  INTERVAL HISTORY  Mr Keller is here for his scheduled followup for myeloma prior to start 3rd cycle of treatment for his Myeloma. He notes that he had some nausea with the Cytoxan 1st dose but has tolerated it better since then. Recommended ensuring pre-medication with zofran. Notes near complete resolution of b/l leg swelling . Leg ulcer have resolved.  He has dropped about 19 lbs (eating well- large part could be 3rd spaced fluids) No SOB/CP Energy levels improved. Has developed a stage 2 decubitus ulcer on the buttocks -- referral given to wound care clinic for recommendation that can be implemented by his home health RN. No fevers/chills/night sweats.   MEDICAL HISTORY:  Past Medical History:  Diagnosis Date  . Arthritis    "left shoulder" (07/31/2016)  . Coronary artery disease   . GERD (gastroesophageal reflux disease)   . Heart murmur   . High cholesterol   . Hypertension   . Multiple myeloma ([place])   . Sleep apnea    "probably; having test in November" (07/31/2016)  . Type II diabetes mellitus ([place])     SURGICAL HISTORY: Past Surgical History:  Procedure Laterality Date  . CARDIAC CATHETERIZATION N/A 07/31/2016   Procedure: Left Heart Cath and Coronary Angiography;  Surgeon: Lorretta Harp, MD;  Location: [facility];  Service: Cardiovascular;  Laterality: N/A;  . CARDIAC CATHETERIZATION N/A 07/31/2016   Procedure: Coronary Stent Intervention;  Surgeon: Lorretta Harp, MD;  Location: [facility];  Service: Cardiovascular;  Laterality: N/A;  . CORONARY ANGIOPLASTY    .  SHOULDER SURGERY Left 1973   "put pin in it where it had separated"   . TONSILLECTOMY  ~ 1956    SOCIAL HISTORY: Social History   Social History  . Marital status: Divorced    Spouse name: N/A  . Number of children: N/A  . Years of education: N/A   Occupational History  . Not on file.   Social History Main Topics  . Smoking status: Never Smoker  . Smokeless tobacco: Never Used  . Alcohol use Yes     Comment: 07/31/2016 "nothing since 2002"  . Drug use: No  . Sexual activity: Not Currently   Other Topics Concern  . Not on file   Social History Narrative  . No narrative on file    FAMILY HISTORY: Family History  Problem Relation Age of Onset  . Hypertension Other     ALLERGIES:  has No Known Allergies.  MEDICATIONS:  Current Outpatient Prescriptions  Medication Sig Dispense Refill  . acyclovir (ZOVIRAX) 400 MG tablet Take 1 tablet (400 mg total) by mouth 2 (two) times daily. 60 tablet 3  . atorvastatin (LIPITOR) 40 MG tablet Take 1 tablet (40 mg total) by mouth at bedtime. 30 tablet 12  . bisacodyl (DULCOLAX) 5 MG EC tablet Take 1 tablet (5 mg total) by mouth daily as needed for moderate constipation. 30 tablet 0  . Cholecalciferol (VITAMIN D3) 5000 units CAPS Take 1 capsule by mouth daily.     . clopidogrel (PLAVIX) 75 MG tablet Take 1 tablet (75 mg total) by mouth daily with breakfast. 30 tablet 12  . Cyanocobalamin (B-12) 1000 MCG SUBL Place 1,000 mcg under the tongue daily. 30 each 3  . cyclophosphamide (CYTOXAN) 50 MG tablet Take 9 tablets (450 mg total) by mouth once a week. Days 1, 8, and 15 of each cycle give on an empty stomach in AM. 27 tablet 1  . dexamethasone (DECADRON) 4 MG tablet Take 5 tablets (20 mg) on days 1, 8, and 15 of chemo. Repeat every 21 days. 30 tablet 3  . feeding supplement, ENSURE ENLIVE, (ENSURE ENLIVE) LIQD Take 237 mLs by mouth 2 (two) times daily between meals. 237 mL 12  . furosemide (LASIX) 40 MG tablet     . insulin aspart  (NOVOLOG) 100 UNIT/ML injection Correction coverage: Sensitive (thin, NPO, renal)  CBG < 70: implement hypoglycemia protocol  CBG 70 - 120: 0 units  CBG 121 - 150: 1 unit  CBG 151 - 200: 2 units  CBG 201 - 250: 3 units  CBG 251 - 300: 5 units  CBG 301 - 350: 7 units  CBG 351 - 400 9 units  CBG > 400 call MD and obtain STAT lab verification 10 mL 11  . morphine (MS CONTIN) 15 MG 12 hr tablet Take 1 tablet (15 mg total) by mouth every 12 (twelve) hours. 20 tablet 0  . omeprazole (PRILOSEC) 40 MG capsule     . ondansetron (ZOFRAN) 8 MG tablet Take 1 tablet (8 mg total) by mouth 2 (two) times daily as needed for refractory nausea / vomiting. Starting on days 4 and 11 not on day 8. 30 tablet 1  . oxyCODONE (OXY IR/ROXICODONE) 5 MG immediate release tablet Take 1 tablet (5 mg total)  by mouth every 6 (six) hours as needed for severe pain. 60 tablet 0  . oxyCODONE (OXYCONTIN) 10 mg 12 hr tablet Take 1 tablet (10 mg total) by mouth every 12 (twelve) hours. 60 tablet 0  . polyethylene glycol (MIRALAX / GLYCOLAX) packet Take 17 g by mouth daily. 14 each 0  . prochlorperazine (COMPAZINE) 10 MG tablet Take 1 tablet (10 mg total) by mouth every 6 (six) hours as needed (Nausea or vomiting). 30 tablet 1  . warfarin (COUMADIN) 6 MG tablet Take 6 mg by mouth every morning.     . carvedilol (COREG) 12.5 MG tablet Take 1 tablet (12.5 mg total) by mouth 2 (two) times daily with a meal. 60 tablet 2  . potassium chloride SA (K-DUR,KLOR-CON) 20 MEQ tablet Take 1 tablet (20 mEq total) by mouth daily.     Current Facility-Administered Medications  Medication Dose Route Frequency Provider Last Rate Last Dose  . potassium chloride SA (K-DUR,KLOR-CON) CR tablet 40 mEq  40 mEq Oral Once Brunetta Genera, MD        REVIEW OF SYSTEMS:    10 Point review of Systems was done is negative except as noted above.  PHYSICAL EXAMINATION: ECOG PERFORMANCE STATUS: 3 - Symptomatic, >50% confined to bed  . Vitals:    12/08/16 1005  BP: 111/64  Pulse: 64  Resp: 18  Temp: 98 F (36.7 C)   Filed Weights   12/08/16 1005  Weight: 200 lb 4.8 oz (90.9 kg)   .Body mass index is 28.74 kg/m.  GENERAL: NAD SKIN: petechiae over upper extremities OROPHARYNX: MMM  NECK: supple,+ JVD, LYMPH: no palpable lymphadenopathy in the cervical, axillary or inguinal LUNGS: Decreased entry bilaterally, few basal rales noted HEART: S1-S2 irreg ABDOMEN: abdomen soft, non-tender, normoactive bowel sounds, no hepatosplenomegaly palpable PSYCH: Alert and oriented 3  NEURO: non focal. LE : b/l 1-2 + pedal edema .   LABORATORY DATA:  I have reviewed the data as listed . CBC Latest Ref Rng & Units 12/08/2016 11/24/2016 11/20/2016  WBC 4.0 - 10.3 10e3/uL 3.1(L) 3.9(L) 2.9(L)  Hemoglobin 13.0 - 17.1 g/dL 9.9(L) 9.6(L) 9.7(L)  Hematocrit 38.4 - 49.9 % 30.4(L) 30.6(L) 28.8(L)  Platelets 140 - 400 10e3/uL 194 82(L) 101(L)   . CMP Latest Ref Rng & Units 12/08/2016 11/24/2016 11/20/2016  Glucose 70 - 140 mg/dl 119 98 104  BUN 7.0 - 26.0 mg/dL 16.4 31.2(H) 33.7(H)  Creatinine 0.7 - 1.3 mg/dL 0.8 1.3 1.1  Sodium 136 - 145 mEq/L 144 142 141  Potassium 3.5 - 5.1 mEq/L 3.0(LL) 4.3 4.2  Chloride 101 - 111 mmol/L - - -  CO2 22 - 29 mEq/L '22 27 25  ' Calcium 8.4 - 10.4 mg/dL 8.6 8.7 9.0  Total Protein 6.4 - 8.3 g/dL 5.5(L) 5.5(L) 5.5(L)  Total Bilirubin 0.20 - 1.20 mg/dL 1.37(H) 1.00 1.13  Alkaline Phos 40 - 150 U/L 187(H) 198(H) 211(H)  AST 5 - 34 U/L '17 26 26  ' ALT 0 - 55 U/L 14 22 32   Component     Latest Ref Rng & Units 10/02/2016 11/17/2016  IgG (Immunoglobin G), Serum     700 - 1,600 mg/dL 618 (L) 518 (L)  IgA     61 - 437 mg/dL 28 (L)   IgM, Serum     20 - 172 mg/dL <5 (L)   Total Protein ELP     6.0 - 8.5 g/dL 6.7   Albumin SerPl Elph-Mcnc     2.9 - 4.4 g/dL  3.8 3.1  Alpha 1     0.0 - 0.4 g/dL 0.4 0.3  Alpha2 Glob SerPl Elph-Mcnc     0.4 - 1.0 g/dL 0.8 0.6  B-Globulin SerPl Elph-Mcnc     0.7 - 1.3 g/dL 1.2 0.9    Gamma Glob SerPl Elph-Mcnc     0.4 - 1.8 g/dL 0.6 0.4  M Protein SerPl Elph-Mcnc     Not Observed g/dL Not Observed Not Observed  Globulin, Total     2.2 - 3.9 g/dL 2.9 2.1 (L)  Albumin/Glob SerPl     0.7 - 1.7 1.4 1.5  IFE 1      Comment Comment  Please Note (HCV):      Comment Comment  IgA/Immunoglobulin A, Serum     61 - 437 mg/dL  19 (L)  IgM, Qn, Serum     20 - 172 mg/dL  <5 (L)  Total Protein     6.0 - 8.5 g/dL  5.2 (L)  Kappa free light chain     3.3 - 19.4 mg/L 700.0 (H)   Lamda free light chains     5.7 - 26.3 mg/L 7.4   Kappa, lamda light chain ratio     0.26 - 1.65 94.59 (H)   Ig Kappa Free Light Chain     3.3 - 19.4 mg/L  203.6 (H)  Ig Lambda Free Light Chain     5.7 - 26.3 mg/L  7.0  Kappa/Lambda FluidC Ratio     0.26 - 1.65  29.09 (H)              RADIOGRAPHIC STUDIES: I have personally reviewed the radiological images as listed and agreed with the findings in the report. No results found.  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 (Discussed with Dr. Tresa Moore) K/L 95, No M spike on SPEP -- suggests light chain MM Cytogenetics - normal male chromosomes FISH- +11 and 13q-/-13  Patient is s/p 1 cycle of Vd and Serum free kappa LC has decreased from 700 to 203.6 with improvement in his K/L ratio from 94.59 to 29. Completed 2nd cycle of treatment with Vd + Cytoxan (279m/m2)  2) severe hypercalcemia calcium  a more than 15 now down to normal 9 This is likely due to MM resolved with bisphosphonates, forced saline diuresis and Steroids in the hospital  3) CAD s/p prox LAD DES PCI on 07/31/2016 with ischemic cardiomyopathy ejection fraction of 25-35%. Rpt ECHO on 10/03/2016 shows improvement in EF to 55-60% with no RWMABN.  4) Macrocytic Anemia with moderate thrombocytopenia - due to MM and B12 def hgb 9.9  5) Thrombocytopenia - due to MM and treatment -- appears to have resolved.  5) AKI on CKD -- related to hypercalcemia vs ?cardiorenal syndrome vs MM - improved. Creatinine is improved  To 0.8. Patient has diuresed well and most of the 3rd spaced fluid has resolved.  6) B12 deficiency - Received [place] B12 daily while in the hopsital -cont SL B12 10040m po daily on discharge.  PLAN -no  prohibitive toxicities with Velcade/Dex.  -grrade 1 nausea with Cytoxan -- counseled regarding antinausea medication use. -given his baseline leg swelling and leg weeping we have reduced dexamethasone to 49m D1,8,15 (has had some improvement in LE edema and leg ulcers) -on antiplatelet therapy  -on Acyclovir prophylaxis -continue cyclophosphamide @ 2058mm2 and adjust dose if tolerated. (will hold off on dose escalation at this time) -continue q4weeks IV Pamidronate (dose due today) -Transfuse when necessary to keep the hemoglobin close to/above 9given his significant cardiac comorbidities. -on Oxycontin and prn oxycodone for neoplasm related pain control  6) b/l Lower extremity swelling likely due to CHF an CKD. Plan -continued adjustment and monitoring of diuretic therapy as per PCP and cardiology -restarted on potassium due to hypokalemia  .7) CAD s/p recent DES 8) Afib with RVR on coumadin -continue mx per PCP and cardiology  9) Decubitus Ulcers on buttocks -Stage 2 -wound care clinic referral given for recommendations -has HHS which could implement recommendations.  RTC-continue treatment  as per schedule -wound care clinic referral -RTC with Dr KaIrene Limbon 2 weeks with labs -Continue Pamidronate q4weeks  All of the patients questions were answered with apparent satisfaction. The patient knows to call the clinic with any problems, questions or concerns.  I spent 30 minutes counseling the patient face to face. The total time spent in the appointment was 440 minutes and more than 50% was on counseling and direct patient cares.    GaSullivan LoneD MSOchiltreeAHIVMS SCCenter For Specialized SurgeryTLawrence Medical Centerematology/Oncology Physician CoMedstar Saint [facility](Office):       [phone] cell):  [phone]):           [phone]

## 2016-12-08 NOTE — Patient Instructions (Signed)
[facility] Discharge Instructions for Patients Receiving Chemotherapy  Today you received the following chemotherapy agents VelcadeDurvalumab injection What is this medicine? DURVALUMAB (dur VAL ue mab) is a monoclonal antibody. It is used to treat urothelial cancer. This medicine may be used for other purposes; ask your health care provider or pharmacist if you have questions. COMMON BRAND NAME(S): [id number] What should I tell my health care provider before I take this medicine? They need to know if you have any of these conditions: -diabetes -immune system problems -infection -inflammatory bowel disease -kidney disease -liver disease -lung or breathing disease -lupus -organ transplant -stomach or intestine problems -thyroid disease -an unusual or allergic reaction to durvalumab, other medicines, foods, dyes, or preservatives -pregnant or trying to get pregnant -breast-feeding How should I use this medicine? This medicine is for infusion into a vein. It is given by a health care professional in a hospital or clinic setting. A special MedGuide will be given to you before each treatment. Be sure to read this information carefully each time. Talk to your pediatrician regarding the use of this medicine in children. Special care may be needed. Overdosage: If you think you have taken too much of this medicine contact a poison control center or emergency room at once. NOTE: This medicine is only for you. Do not share this medicine with others. What if I miss a dose? It is important not to miss your dose. Call your doctor or health care professional if you are unable to keep an appointment. What may interact with this medicine? Interactions have not been studied. This list may not describe all possible interactions. Give your health care provider a list of all the medicines, herbs, non-prescription drugs, or dietary supplements you use. Also tell them if you smoke, drink  alcohol, or use illegal drugs. Some items may interact with your medicine. What should I watch for while using this medicine? This drug may make you feel generally unwell. Continue your course of treatment even though you feel ill unless your doctor tells you to stop. You may need blood work done while you are taking this medicine. Do not become pregnant while taking this medicine or for 3 months after stopping it. Women should inform their doctor if they wish to become pregnant or think they might be pregnant. There is a potential for serious side effects to an unborn child. Talk to your health care professional or pharmacist for more information. Do not breast-feed an infant while taking this medicine or for 3 months after stopping it. What side effects may I notice from receiving this medicine? Side effects that you should report to your doctor or health care professional as soon as possible: -allergic reactions like skin rash, itching or hives, swelling of the face, lips, or tongue -black, tarry stools -bloody or watery diarrhea -breathing problems -change in emotions or moods -change in sex drive -changes in vision -chest pain or chest tightness -chills -confusion -cough -facial flushing -fever -headache -signs and symptoms of high blood sugar such as dizziness; dry mouth; dry skin; fruity breath; nausea; stomach pain; increased hunger or thirst; increased urination -signs and symptoms of liver injury like dark yellow or brown urine; general ill feeling or flu-like symptoms; light-colored stools; loss of appetite; nausea; right upper belly pain; unusually weak or tired; yellowing of the eyes or skin -stomach pain -trouble passing urine or change in the amount of urine -weight gain or weight loss Side effects that usually do not require medical  attention (report these to your doctor or health care professional if they continue or are bothersome): -bone pain -constipation -loss of  appetite -muscle pain -nausea -swelling of the ankles, feet, hands -tiredness This list may not describe all possible side effects. Call your doctor for medical advice about side effects. You may report side effects to FDA at 1-800-FDA-1088. Where should I keep my medicine? This drug is given in a hospital or clinic and will not be stored at home. NOTE: This sheet is a summary. It may not cover all possible information. If you have questions about this medicine, talk to your doctor, pharmacist, or health care provider.  2017 Elsevier/Gold Standard (2016-05-09 15:50:36)   To help prevent nausea and vomiting after your treatment, we encourage you to take your nausea medication as directed.    If you develop nausea and vomiting that is not controlled by your nausea medication, call the clinic.   BELOW ARE SYMPTOMS THAT SHOULD BE REPORTED IMMEDIATELY:  *FEVER GREATER THAN 100.5 F  *CHILLS WITH OR WITHOUT FEVER  NAUSEA AND VOMITING THAT IS NOT CONTROLLED WITH YOUR NAUSEA MEDICATION  *UNUSUAL SHORTNESS OF BREATH  *UNUSUAL BRUISING OR BLEEDING  TENDERNESS IN MOUTH AND THROAT WITH OR WITHOUT PRESENCE OF ULCERS  *URINARY PROBLEMS  *BOWEL PROBLEMS  UNUSUAL RASH Items with * indicate a potential emergency and should be followed up as soon as possible.  Feel free to call the clinic you have any questions or concerns. The clinic phone number is [phone].  Please show the [place] at check-in to the Emergency Department and triage nurse.

## 2016-12-09 LAB — KAPPA/LAMBDA LIGHT CHAINS
IG KAPPA FREE LIGHT CHAIN: 62.7 mg/L — AB (ref 3.3–19.4)
Ig Lambda Free Light Chain: 5.1 mg/L — ABNORMAL LOW (ref 5.7–26.3)
Kappa/Lambda FluidC Ratio: 12.29 — ABNORMAL HIGH (ref 0.26–1.65)

## 2016-12-09 LAB — VITAMIN B12: Vitamin B12: 314 pg/mL (ref 232–1245)

## 2016-12-09 NOTE — Addendum Note (Signed)
Addended by: Neysa Hotter on: 12/09/2016 09:10 AM   Modules accepted: Orders

## 2016-12-11 ENCOUNTER — Other Ambulatory Visit: Payer: Self-pay | Admitting: Hematology

## 2016-12-11 ENCOUNTER — Ambulatory Visit (HOSPITAL_BASED_OUTPATIENT_CLINIC_OR_DEPARTMENT_OTHER): Payer: Medicare HMO

## 2016-12-11 VITALS — BP 143/80 | HR 71 | Temp 97.7°F | Resp 18

## 2016-12-11 DIAGNOSIS — G893 Neoplasm related pain (acute) (chronic): Secondary | ICD-10-CM

## 2016-12-11 DIAGNOSIS — Z5112 Encounter for antineoplastic immunotherapy: Secondary | ICD-10-CM

## 2016-12-11 DIAGNOSIS — C9000 Multiple myeloma not having achieved remission: Secondary | ICD-10-CM

## 2016-12-11 LAB — MULTIPLE MYELOMA PANEL, SERUM
ALBUMIN SERPL ELPH-MCNC: 3.2 g/dL (ref 2.9–4.4)
ALBUMIN/GLOB SERPL: 1.8 — AB (ref 0.7–1.7)
ALPHA 1: 0.2 g/dL (ref 0.0–0.4)
Alpha2 Glob SerPl Elph-Mcnc: 0.6 g/dL (ref 0.4–1.0)
B-GLOBULIN SERPL ELPH-MCNC: 0.7 g/dL (ref 0.7–1.3)
GAMMA GLOB SERPL ELPH-MCNC: 0.3 g/dL — AB (ref 0.4–1.8)
GLOBULIN, TOTAL: 1.8 — AB (ref 2.2–3.9)
IGA/IMMUNOGLOBULIN A, SERUM: 16 mg/dL — AB (ref 61–437)
IGG (IMMUNOGLOBIN G), SERUM: 477 mg/dL — AB (ref 700–1600)
IgM, Qn, Serum: 6 mg/dL — ABNORMAL LOW (ref 20–172)
Total Protein: 5 g/dL — ABNORMAL LOW (ref 6.0–8.5)

## 2016-12-11 MED ORDER — OXYCODONE HCL 5 MG PO TABS: 5.0000 mg | ORAL_TABLET | Freq: Four times a day (QID) | ORAL | 0 refills | Status: DC | PRN

## 2016-12-11 MED ORDER — BORTEZOMIB CHEMO SQ INJECTION 3.5 MG (2.5MG/ML)
1.3000 mg/m2 | Freq: Once | INTRAMUSCULAR | Status: AC
  Administered 2016-12-11: 3 mg via SUBCUTANEOUS
  Filled 2016-12-11: qty 3

## 2016-12-11 MED ORDER — ONDANSETRON HCL 8 MG PO TABS
ORAL_TABLET | ORAL | Status: AC
  Filled 2016-12-11: qty 1

## 2016-12-11 MED ORDER — ONDANSETRON HCL 8 MG PO TABS
8.0000 mg | ORAL_TABLET | Freq: Once | ORAL | Status: AC
  Administered 2016-12-11: 8 mg via ORAL

## 2016-12-11 NOTE — Patient Instructions (Signed)
[facility] Discharge Instructions for Patients Receiving Chemotherapy  Today you received the following chemotherapy agents Velcade. To help prevent nausea and vomiting after your treatment, we encourage you to take your nausea medication as directed.  If you develop nausea and vomiting that is not controlled by your nausea medication, call the clinic.   BELOW ARE SYMPTOMS THAT SHOULD BE REPORTED IMMEDIATELY:  *FEVER GREATER THAN 100.5 F  *CHILLS WITH OR WITHOUT FEVER  NAUSEA AND VOMITING THAT IS NOT CONTROLLED WITH YOUR NAUSEA MEDICATION  *UNUSUAL SHORTNESS OF BREATH  *UNUSUAL BRUISING OR BLEEDING  TENDERNESS IN MOUTH AND THROAT WITH OR WITHOUT PRESENCE OF ULCERS  *URINARY PROBLEMS  *BOWEL PROBLEMS  UNUSUAL RASH Items with * indicate a potential emergency and should be followed up as soon as possible.  Feel free to call the clinic you have any questions or concerns. The clinic phone number is [phone].  Please show the CHEMO ALERT CARD at check-in to the Emergency Department and triage nurse.    

## 2016-12-15 ENCOUNTER — Other Ambulatory Visit (HOSPITAL_BASED_OUTPATIENT_CLINIC_OR_DEPARTMENT_OTHER): Payer: Medicare HMO

## 2016-12-15 ENCOUNTER — Ambulatory Visit (HOSPITAL_BASED_OUTPATIENT_CLINIC_OR_DEPARTMENT_OTHER): Payer: Medicare HMO

## 2016-12-15 VITALS — BP 107/76 | HR 75 | Temp 97.7°F | Resp 18

## 2016-12-15 DIAGNOSIS — Z5112 Encounter for antineoplastic immunotherapy: Secondary | ICD-10-CM

## 2016-12-15 DIAGNOSIS — C9000 Multiple myeloma not having achieved remission: Secondary | ICD-10-CM

## 2016-12-15 LAB — CBC & DIFF AND RETIC
BASO%: 0.3 % (ref 0.0–2.0)
Basophils Absolute: 0 10*3/uL (ref 0.0–0.1)
EOS%: 0.8 % (ref 0.0–7.0)
Eosinophils Absolute: 0 10*3/uL (ref 0.0–0.5)
HEMATOCRIT: 34 % — AB (ref 38.4–49.9)
HGB: 10.8 g/dL — ABNORMAL LOW (ref 13.0–17.1)
Immature Retic Fract: 12.8 % — ABNORMAL HIGH (ref 3.00–10.60)
LYMPH#: 0.5 10*3/uL — AB (ref 0.9–3.3)
LYMPH%: 13.7 % — ABNORMAL LOW (ref 14.0–49.0)
MCH: 30.4 pg (ref 27.2–33.4)
MCHC: 31.8 g/dL — AB (ref 32.0–36.0)
MCV: 95.8 fL (ref 79.3–98.0)
MONO#: 0.3 10*3/uL (ref 0.1–0.9)
MONO%: 7.8 % (ref 0.0–14.0)
NEUT%: 77.4 % — ABNORMAL HIGH (ref 39.0–75.0)
NEUTROS ABS: 3.1 10*3/uL (ref 1.5–6.5)
Platelets: 95 10*3/uL — ABNORMAL LOW (ref 140–400)
RBC: 3.55 10*6/uL — AB (ref 4.20–5.82)
RDW: 17.7 % — ABNORMAL HIGH (ref 11.0–14.6)
RETIC %: 1.31 % (ref 0.80–1.80)
RETIC CT ABS: 46.51 10*3/uL (ref 34.80–93.90)
WBC: 4 10*3/uL (ref 4.0–10.3)

## 2016-12-15 LAB — COMPREHENSIVE METABOLIC PANEL
ALT: 13 U/L (ref 0–55)
AST: 19 U/L (ref 5–34)
Albumin: 3.4 g/dL — ABNORMAL LOW (ref 3.5–5.0)
Alkaline Phosphatase: 176 U/L — ABNORMAL HIGH (ref 40–150)
Anion Gap: 8 mEq/L (ref 3–11)
BUN: 16.5 mg/dL (ref 7.0–26.0)
CALCIUM: 8.9 mg/dL (ref 8.4–10.4)
CHLORIDE: 109 meq/L (ref 98–109)
CO2: 27 meq/L (ref 22–29)
Creatinine: 1.1 mg/dL (ref 0.7–1.3)
EGFR: 69 mL/min/{1.73_m2} — ABNORMAL LOW (ref >90)
Glucose: 113 mg/dl (ref 70–140)
POTASSIUM: 4.4 meq/L (ref 3.5–5.1)
SODIUM: 143 meq/L (ref 136–145)
Total Bilirubin: 1 mg/dL (ref 0.20–1.20)
Total Protein: 5.5 g/dL — ABNORMAL LOW (ref 6.4–8.3)

## 2016-12-15 MED ORDER — BORTEZOMIB CHEMO SQ INJECTION 3.5 MG (2.5MG/ML)
1.3000 mg/m2 | Freq: Once | INTRAMUSCULAR | Status: AC
  Administered 2016-12-15: 3 mg via SUBCUTANEOUS
  Filled 2016-12-15: qty 3

## 2016-12-15 MED ORDER — ONDANSETRON HCL 8 MG PO TABS
ORAL_TABLET | ORAL | Status: AC
  Filled 2016-12-15: qty 1

## 2016-12-15 MED ORDER — ONDANSETRON HCL 8 MG PO TABS
8.0000 mg | ORAL_TABLET | Freq: Once | ORAL | Status: AC
  Administered 2016-12-15: 8 mg via ORAL

## 2016-12-15 NOTE — Patient Instructions (Signed)
[facility] Discharge Instructions for Patients Receiving Chemotherapy  Today you received the following chemotherapy agents Velcade. To help prevent nausea and vomiting after your treatment, we encourage you to take your nausea medication as directed.  If you develop nausea and vomiting that is not controlled by your nausea medication, call the clinic.   BELOW ARE SYMPTOMS THAT SHOULD BE REPORTED IMMEDIATELY:  *FEVER GREATER THAN 100.5 F  *CHILLS WITH OR WITHOUT FEVER  NAUSEA AND VOMITING THAT IS NOT CONTROLLED WITH YOUR NAUSEA MEDICATION  *UNUSUAL SHORTNESS OF BREATH  *UNUSUAL BRUISING OR BLEEDING  TENDERNESS IN MOUTH AND THROAT WITH OR WITHOUT PRESENCE OF ULCERS  *URINARY PROBLEMS  *BOWEL PROBLEMS  UNUSUAL RASH Items with * indicate a potential emergency and should be followed up as soon as possible.  Feel free to call the clinic you have any questions or concerns. The clinic phone number is [phone].  Please show the CHEMO ALERT CARD at check-in to the Emergency Department and triage nurse.    

## 2016-12-15 NOTE — Progress Notes (Signed)
Ok to treat with platelet count of 95 per MD Irene Limbo.

## 2016-12-16 LAB — KAPPA/LAMBDA LIGHT CHAINS
IG LAMBDA FREE LIGHT CHAIN: 4.2 mg/L — AB (ref 5.7–26.3)
Ig Kappa Free Light Chain: 51.3 mg/L — ABNORMAL HIGH (ref 3.3–19.4)
KAPPA/LAMBDA FLC RATIO: 12.21 — AB (ref 0.26–1.65)

## 2016-12-18 ENCOUNTER — Ambulatory Visit (HOSPITAL_BASED_OUTPATIENT_CLINIC_OR_DEPARTMENT_OTHER): Payer: Medicare HMO

## 2016-12-18 ENCOUNTER — Other Ambulatory Visit (HOSPITAL_BASED_OUTPATIENT_CLINIC_OR_DEPARTMENT_OTHER): Payer: Medicare HMO

## 2016-12-18 ENCOUNTER — Encounter: Payer: Self-pay | Admitting: Hematology

## 2016-12-18 ENCOUNTER — Ambulatory Visit (HOSPITAL_BASED_OUTPATIENT_CLINIC_OR_DEPARTMENT_OTHER): Payer: Medicare HMO | Admitting: Hematology

## 2016-12-18 VITALS — BP 131/73 | HR 70 | Temp 98.0°F | Resp 18 | Ht 70.0 in | Wt 197.9 lb

## 2016-12-18 DIAGNOSIS — D696 Thrombocytopenia, unspecified: Secondary | ICD-10-CM

## 2016-12-18 DIAGNOSIS — D649 Anemia, unspecified: Secondary | ICD-10-CM | POA: Diagnosis not present

## 2016-12-18 DIAGNOSIS — C9000 Multiple myeloma not having achieved remission: Secondary | ICD-10-CM | POA: Diagnosis not present

## 2016-12-18 DIAGNOSIS — Z5112 Encounter for antineoplastic immunotherapy: Secondary | ICD-10-CM | POA: Diagnosis not present

## 2016-12-18 LAB — CBC & DIFF AND RETIC
BASO%: 0 % (ref 0.0–2.0)
BASOS ABS: 0 10*3/uL (ref 0.0–0.1)
EOS%: 0.3 % (ref 0.0–7.0)
Eosinophils Absolute: 0 10*3/uL (ref 0.0–0.5)
HEMATOCRIT: 33.5 % — AB (ref 38.4–49.9)
HEMOGLOBIN: 10.9 g/dL — AB (ref 13.0–17.1)
Immature Retic Fract: 3.6 % (ref 3.00–10.60)
LYMPH#: 0.4 10*3/uL — AB (ref 0.9–3.3)
LYMPH%: 11.6 % — ABNORMAL LOW (ref 14.0–49.0)
MCH: 30.9 pg (ref 27.2–33.4)
MCHC: 32.5 g/dL (ref 32.0–36.0)
MCV: 94.9 fL (ref 79.3–98.0)
MONO#: 0.4 10*3/uL (ref 0.1–0.9)
MONO%: 12.1 % (ref 0.0–14.0)
NEUT#: 2.8 10*3/uL (ref 1.5–6.5)
NEUT%: 76 % — AB (ref 39.0–75.0)
NRBC: 1 % — AB (ref 0–0)
Platelets: 62 10*3/uL — ABNORMAL LOW (ref 140–400)
RBC: 3.53 10*6/uL — ABNORMAL LOW (ref 4.20–5.82)
RDW: 17.6 % — AB (ref 11.0–14.6)
RETIC %: 0.44 % — AB (ref 0.80–1.80)
Retic Ct Abs: 15.53 10*3/uL — ABNORMAL LOW (ref 34.80–93.90)
WBC: 3.6 10*3/uL — ABNORMAL LOW (ref 4.0–10.3)

## 2016-12-18 LAB — COMPREHENSIVE METABOLIC PANEL
ALT: 11 U/L (ref 0–55)
AST: 18 U/L (ref 5–34)
Albumin: 3.4 g/dL — ABNORMAL LOW (ref 3.5–5.0)
Alkaline Phosphatase: 182 U/L — ABNORMAL HIGH (ref 40–150)
Anion Gap: 6 mEq/L (ref 3–11)
BUN: 18.6 mg/dL (ref 7.0–26.0)
CHLORIDE: 110 meq/L — AB (ref 98–109)
CO2: 27 mEq/L (ref 22–29)
CREATININE: 1 mg/dL (ref 0.7–1.3)
Calcium: 8.7 mg/dL (ref 8.4–10.4)
EGFR: 75 mL/min/{1.73_m2} — ABNORMAL LOW (ref >90)
Glucose: 103 mg/dl (ref 70–140)
POTASSIUM: 4.1 meq/L (ref 3.5–5.1)
SODIUM: 143 meq/L (ref 136–145)
Total Bilirubin: 1.05 mg/dL (ref 0.20–1.20)
Total Protein: 5.4 g/dL — ABNORMAL LOW (ref 6.4–8.3)

## 2016-12-18 LAB — TECHNOLOGIST REVIEW

## 2016-12-18 MED ORDER — ONDANSETRON HCL 8 MG PO TABS
ORAL_TABLET | ORAL | Status: AC
  Filled 2016-12-18: qty 1

## 2016-12-18 MED ORDER — BORTEZOMIB CHEMO SQ INJECTION 3.5 MG (2.5MG/ML)
1.3000 mg/m2 | Freq: Once | INTRAMUSCULAR | Status: AC
  Administered 2016-12-18: 3 mg via SUBCUTANEOUS
  Filled 2016-12-18: qty 3

## 2016-12-18 MED ORDER — ONDANSETRON HCL 8 MG PO TABS
8.0000 mg | ORAL_TABLET | Freq: Once | ORAL | Status: AC
  Administered 2016-12-18: 8 mg via ORAL

## 2016-12-18 NOTE — Progress Notes (Signed)
Per Dr. Irene Limbo, ok to treat today with platelets of 62.  Pt will hold oral cytoxan next week.

## 2016-12-18 NOTE — Patient Instructions (Signed)
Hold Cytoxan (Cyclophosphamide)dose on D15 next Monday Make sure you continue to take your Vit B12

## 2016-12-18 NOTE — Patient Instructions (Signed)
[facility] Discharge Instructions for Patients Receiving Chemotherapy  Today you received the following chemotherapy agents Velcade. To help prevent nausea and vomiting after your treatment, we encourage you to take your nausea medication as directed.  If you develop nausea and vomiting that is not controlled by your nausea medication, call the clinic.   BELOW ARE SYMPTOMS THAT SHOULD BE REPORTED IMMEDIATELY:  *FEVER GREATER THAN 100.5 F  *CHILLS WITH OR WITHOUT FEVER  NAUSEA AND VOMITING THAT IS NOT CONTROLLED WITH YOUR NAUSEA MEDICATION  *UNUSUAL SHORTNESS OF BREATH  *UNUSUAL BRUISING OR BLEEDING  TENDERNESS IN MOUTH AND THROAT WITH OR WITHOUT PRESENCE OF ULCERS  *URINARY PROBLEMS  *BOWEL PROBLEMS  UNUSUAL RASH Items with * indicate a potential emergency and should be followed up as soon as possible.  Feel free to call the clinic you have any questions or concerns. The clinic phone number is [phone].  Please show the CHEMO ALERT CARD at check-in to the Emergency Department and triage nurse.    

## 2016-12-19 LAB — MULTIPLE MYELOMA PANEL, SERUM
ALBUMIN/GLOB SERPL: 1.8 — AB (ref 0.7–1.7)
ALPHA 1: 0.3 g/dL (ref 0.0–0.4)
ALPHA2 GLOB SERPL ELPH-MCNC: 0.6 g/dL (ref 0.4–1.0)
Albumin SerPl Elph-Mcnc: 3.3 g/dL (ref 2.9–4.4)
B-GLOBULIN SERPL ELPH-MCNC: 0.8 g/dL (ref 0.7–1.3)
GAMMA GLOB SERPL ELPH-MCNC: 0.3 g/dL — AB (ref 0.4–1.8)
GLOBULIN, TOTAL: 1.9 g/dL — AB (ref 2.2–3.9)
IGG (IMMUNOGLOBIN G), SERUM: 472 mg/dL — AB (ref 700–1600)
IgA, Qn, Serum: 15 mg/dL — ABNORMAL LOW (ref 61–437)
IgM, Qn, Serum: <5 mg/dL — ABNORMAL LOW (ref 20–172)
Total Protein: 5.2 g/dL — ABNORMAL LOW (ref 6.0–8.5)

## 2016-12-20 ENCOUNTER — Other Ambulatory Visit: Payer: Self-pay | Admitting: Hematology

## 2016-12-20 NOTE — Progress Notes (Signed)
[name]    HEMATOLOGY/ONCOLOGY CLINIC NOTE  Date of Service: .12/18/2016  Patient Care Team: Sandi Mariscal, MD as PCP - General (Internal Medicine)  CHIEF COMPLAINTS/PURPOSE OF CONSULTATION:  Newly diagnosed Multiple Myeloma  HISTORY OF PRESENTING ILLNESS:   Richard Vang is a wonderful 71 y.o. male who is here for his post-hospitalization followup for continued management of newly diagnosed light chain multiple myeloma.  Patient has a history of hypertension, dyslipidemia, sleep apnea, diabetes, coronary artery disease status post drug-eluting PCI in the proximal LAD on 07/31/2016 on dual antiplatelet therapy and Coumadin, systolic heart failure with ejection fraction of 25-35% on cath in October 2017. Patient presented to his primary care physician with increasing shortness of breath and was referred to his cardiologist who eventually referred him to the emergency room for further evaluation and management.  EKG showed atrial fibrillation with PVCs. Patient reported increasing your back pain over the last 6 weeks even at rest. He had labs which showed anemia, hypercalcemia with a calcium of more than 15 and acute on chronic renal failure. CT of the lumbar spine and abdomen showed possible diffuse lytic lesions in the spine, multiage indeterminate compression deformity most prominent at L4. Lytic lesion with aggressive features in the right leg bone concerning for malignancy. Patient was admitted for further evaluation and management.  Patient's hypercalcemia improved with IV bisphosphonates, saline diuresis and steroids. Bone marrow biopsy was consistent with multiple myeloma and showed 67% plasma cells. No M spike on SPEP that abnormal SFLC  consistent with light chain multiple myeloma.  He was also noted to have microcytic anemia with thrombocytopenia and was noted to have B12 deficiency which is being treated with vitamin B12 replacement.  He has multiple medical comorbidities including  cardiac and renal comorbidities that needed a fair amount of attention.  Patient in clinic today notes that he is much better than when he was in the hospital. Has some lower back pains. Appears to be mentally more clear but is still somewhat forgetful. No chest pain or acute shortness of breath. Starting to eat better.  He is keen to get started on treatment as soon as possible.  INTERVAL HISTORY  Richard Vang is here for his scheduled followup for myeloma prior to C3D11  Velcade. Notes back pain is well controlled. Decubtus ulcer is healing. No SOB/CP No fevers/chills/night sweats. No overt bleeding. PLT down to 62k. Since patient is on Plavix and warfarin I recommended holding D15 cytoxan to avoid significant thrombocytopenia and possible bleeding.   MEDICAL HISTORY:  Past Medical History:  Diagnosis Date  . Arthritis    "left shoulder" (07/31/2016)  . Coronary artery disease   . GERD (gastroesophageal reflux disease)   . Heart murmur   . High cholesterol   . Hypertension   . Multiple myeloma ([place])   . Sleep apnea    "probably; having test in November" (07/31/2016)  . Type II diabetes mellitus ([place])     SURGICAL HISTORY: Past Surgical History:  Procedure Laterality Date  . CARDIAC CATHETERIZATION N/A 07/31/2016   Procedure: Left Heart Cath and Coronary Angiography;  Surgeon: Lorretta Harp, MD;  Location: [facility];  Service: Cardiovascular;  Laterality: N/A;  . CARDIAC CATHETERIZATION N/A 07/31/2016   Procedure: Coronary Stent Intervention;  Surgeon: Lorretta Harp, MD;  Location: [facility];  Service: Cardiovascular;  Laterality: N/A;  . CORONARY ANGIOPLASTY    . SHOULDER SURGERY Left 1973   "put pin in it where it had  separated"   . TONSILLECTOMY  ~ 1956    SOCIAL HISTORY: Social History   Social History  . Marital status: Divorced    Spouse name: N/A  . Number of children: N/A  . Years of education: N/A   Occupational History  . Not on file.    Social History Main Topics  . Smoking status: Never Smoker  . Smokeless tobacco: Never Used  . Alcohol use Yes     Comment: 07/31/2016 "nothing since 2002"  . Drug use: No  . Sexual activity: Not Currently   Other Topics Concern  . Not on file   Social History Narrative  . No narrative on file    FAMILY HISTORY: Family History  Problem Relation Age of Onset  . Hypertension Other     ALLERGIES:  has No Known Allergies.  MEDICATIONS:  Current Outpatient Prescriptions  Medication Sig Dispense Refill  . acyclovir (ZOVIRAX) 400 MG tablet Take 1 tablet (400 mg total) by mouth 2 (two) times daily. 60 tablet 3  . atorvastatin (LIPITOR) 40 MG tablet Take 1 tablet (40 mg total) by mouth at bedtime. 30 tablet 12  . bisacodyl (DULCOLAX) 5 MG EC tablet Take 1 tablet (5 mg total) by mouth daily as needed for moderate constipation. 30 tablet 0  . Cholecalciferol (VITAMIN D3) 5000 units CAPS Take 1 capsule by mouth daily.     . clopidogrel (PLAVIX) 75 MG tablet Take 1 tablet (75 mg total) by mouth daily with breakfast. 30 tablet 12  . Cyanocobalamin (B-12) 1000 MCG SUBL Place 1,000 mcg under the tongue daily. 30 each 3  . cyclophosphamide (CYTOXAN) 50 MG tablet Take 9 tablets (450 mg total) by mouth once a week. Days 1, 8, and 15 of each cycle give on an empty stomach in AM. 27 tablet 1  . dexamethasone (DECADRON) 4 MG tablet Take 5 tablets (20 mg) on days 1, 8, and 15 of chemo. Repeat every 21 days. 30 tablet 3  . feeding supplement, ENSURE ENLIVE, (ENSURE ENLIVE) LIQD Take 237 mLs by mouth 2 (two) times daily between meals. 237 mL 12  . furosemide (LASIX) 40 MG tablet     . insulin aspart (NOVOLOG) 100 UNIT/ML injection Correction coverage: Sensitive (thin, NPO, renal)  CBG < 70: implement hypoglycemia protocol  CBG 70 - 120: 0 units  CBG 121 - 150: 1 unit  CBG 151 - 200: 2 units  CBG 201 - 250: 3 units  CBG 251 - 300: 5 units  CBG 301 - 350: 7 units  CBG 351 - 400 9 units  CBG >  400 call MD and obtain STAT lab verification 10 mL 11  . morphine (MS CONTIN) 15 MG 12 hr tablet Take 1 tablet (15 mg total) by mouth every 12 (twelve) hours. 20 tablet 0  . omeprazole (PRILOSEC) 40 MG capsule     . ondansetron (ZOFRAN) 8 MG tablet Take 1 tablet (8 mg total) by mouth 2 (two) times daily as needed for refractory nausea / vomiting. Starting on days 4 and 11 not on day 8. 30 tablet 1  . oxyCODONE (OXY IR/ROXICODONE) 5 MG immediate release tablet Take 1 tablet (5 mg total) by mouth every 6 (six) hours as needed for severe pain. 60 tablet 0  . oxyCODONE (OXYCONTIN) 10 mg 12 hr tablet Take 1 tablet (10 mg total) by mouth every 12 (twelve) hours. 60 tablet 0  . polyethylene glycol (MIRALAX / GLYCOLAX) packet Take 17 g by mouth daily. [place]  each 0  . potassium chloride SA (K-DUR,KLOR-CON) 20 MEQ tablet Take 1 tablet (20 mEq total) by mouth daily.    . prochlorperazine (COMPAZINE) 10 MG tablet Take 1 tablet (10 mg total) by mouth every 6 (six) hours as needed (Nausea or vomiting). 30 tablet 1  . warfarin (COUMADIN) 6 MG tablet Take 6 mg by mouth every morning.     . carvedilol (COREG) 12.5 MG tablet Take 1 tablet (12.5 mg total) by mouth 2 (two) times daily with a meal. 60 tablet 2   No current facility-administered medications for this visit.     REVIEW OF SYSTEMS:    10 Point review of Systems was done is negative except as noted above.  PHYSICAL EXAMINATION: ECOG PERFORMANCE STATUS: 3 - Symptomatic, >50% confined to bed  . Vitals:   12/18/16 1150  BP: 131/73  Pulse: 70  Resp: 18  Temp: 98 F (36.7 C)   Filed Weights   12/18/16 1150  Weight: 197 lb 14.4 oz (89.8 kg)   .Body mass index is 28.4 kg/m.  GENERAL: NAD SKIN: petechiae over upper extremities OROPHARYNX: MMM  NECK: supple,+ JVD, LYMPH: no palpable lymphadenopathy in the cervical, axillary or inguinal LUNGS: Decreased entry bilaterally, few basal rales noted HEART: S1-S2 irreg ABDOMEN: abdomen soft,  non-tender, normoactive bowel sounds, no hepatosplenomegaly palpable PSYCH: Alert and oriented 3  NEURO: non focal. LE : b/l trace pedal edema   LABORATORY DATA:  I have reviewed the data as listed . CBC Latest Ref Rng & Units 12/18/2016 12/15/2016 12/08/2016  WBC 4.0 - 10.3 10e3/uL 3.6(L) 4.0 3.1(L)  Hemoglobin 13.0 - 17.1 g/dL 10.9(L) 10.8(L) 9.9(L)  Hematocrit 38.4 - 49.9 % 33.5(L) 34.0(L) 30.4(L)  Platelets 140 - 400 10e3/uL 62 Large platelets present(L) 95(L) 194   . CMP Latest Ref Rng & Units 12/18/2016 12/15/2016 12/15/2016  Glucose 70 - 140 mg/dl 103 113 -  BUN 7.0 - 26.0 mg/dL 18.6 16.5 -  Creatinine 0.7 - 1.3 mg/dL 1.0 1.1 -  Sodium 136 - 145 mEq/L 143 143 -  Potassium 3.5 - 5.1 mEq/L 4.1 4.4 -  Chloride 101 - 111 mmol/L - - -  CO2 22 - 29 mEq/L 27 27 -  Calcium 8.4 - 10.4 mg/dL 8.7 8.9 -  Total Protein 6.4 - 8.3 g/dL 5.4(L) 5.5(L) 5.2(L)  Total Bilirubin 0.20 - 1.20 mg/dL 1.05 1.00 -  Alkaline Phos 40 - 150 U/L 182(H) 176(H) -  AST 5 - 34 U/L 18 19 -  ALT 0 - 55 U/L 11 13 -   Component     Latest Ref Rng & Units 10/02/2016 11/17/2016  IgG (Immunoglobin G), Serum     700 - 1,600 mg/dL 618 (L) 518 (L)  IgA     61 - 437 mg/dL 28 (L)   IgM, Serum     20 - 172 mg/dL <5 (L)   Total Protein ELP     6.0 - 8.5 g/dL 6.7   Albumin SerPl Elph-Mcnc     2.9 - 4.4 g/dL 3.8 3.1  Alpha 1     0.0 - 0.4 g/dL 0.4 0.3  Alpha2 Glob SerPl Elph-Mcnc     0.4 - 1.0 g/dL 0.8 0.6  B-Globulin SerPl Elph-Mcnc     0.7 - 1.3 g/dL 1.2 0.9  Gamma Glob SerPl Elph-Mcnc     0.4 - 1.8 g/dL 0.6 0.4  M Protein SerPl Elph-Mcnc     Not Observed g/dL Not Observed Not Observed  Globulin, Total  2.2 - 3.9 g/dL 2.9 2.1 (L)  Albumin/Glob SerPl     0.7 - 1.7 1.4 1.5  IFE 1      Comment Comment  Please Note (HCV):      Comment Comment  IgA/Immunoglobulin A, Serum     61 - 437 mg/dL  19 (L)  IgM, Qn, Serum     20 - 172 mg/dL  <5 (L)  Total Protein     6.0 - 8.5 g/dL  5.2 (L)  Kappa free light  chain     3.3 - 19.4 mg/L 700.0 (H)   Lamda free light chains     5.7 - 26.3 mg/L 7.4   Kappa, lamda light chain ratio     0.26 - 1.65 94.59 (H)   Ig Kappa Free Light Chain     3.3 - 19.4 mg/L  203.6 (H)  Ig Lambda Free Light Chain     5.7 - 26.3 mg/L  7.0  Kappa/Lambda FluidC Ratio     0.26 - 1.65  29.09 (H)               RADIOGRAPHIC STUDIES: I have personally reviewed the radiological images as listed and agreed with the findings in the report. No results found.  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 (Discussed with Dr. Tresa Moore) K/L 95, No M spike on SPEP -- suggests light chain MM Cytogenetics - normal male chromosomes FISH- +11 and 13q-/-13  Patient is s/p 1 cycle of Vd and Serum free kappa LC has decreased from 700 to 203.6 with improvement in his K/L ratio from 94.59 to 29. Completed 2nd cycle of treatment with Vd + Cytoxan (271m/m2) Currently today receiving C3D11 Velcade  Kappa free light chains have improved progressively from 700 down to around 50 with improivng K/L ratio.  2) severe hypercalcemia calcium a more than 15 now down to normal 9 This is likely due to MM resolved with bisphosphonates, forced saline diuresis and Steroids in the hospital  3) CAD s/p prox LAD DES PCI on 07/31/2016 with ischemic cardiomyopathy ejection fraction of 25-35%. Rpt ECHO on 10/03/2016 shows improvement in EF to 55-60% with no  RWMABN.  4) Macrocytic Anemia with moderate thrombocytopenia - due to MM and B12 def hgb 10.9  5) Thrombocytopenia - due to MM and treatment -- plt down to 65k today. No bleeding.  5) AKI on CKD -- related to hypercalcemia vs ?cardiorenal syndrome vs MM - improved. Creatinine is improved  To 0.8. Patient has diuresed well and most of the 3rd spaced fluid has resolved.  6) B12 deficiency - Received [place] B12 daily while in the hopsital -cont SL B12 100110m po daily on discharge.  PLAN --continue Velcade D1,4,8,11. C3 D11 today -given thrombocytopenia we have told the patient to hold D15 cytoxan especially since patient is alson oplavix and coumadin -given his baseline leg swelling and leg weeping we have reduced dexamethasone to 2064m1,8,15 (has had some improvement in LE edema and leg ulcers) -on antiplatelet therapy  -on Acyclovir prophylaxis -  we will restart cytoxan from next cycle if blood counts stable. -continue q4weeks IV Pamidronate  -Transfuse when necessary to keep the hemoglobin close to/above 9given his significant cardiac comorbidities. -on Oxycontin and prn oxycodone for neoplasm related pain control  6) b/l Lower extremity swelling likely due to CHF an CKD. Plan -continued adjustment and monitoring of diuretic therapy as per PCP and cardiology -restarted on potassium due to hypokalemia  .7) CAD s/p recent DES 8) Afib with RVR on coumadin -continue mx per PCP and cardiology  9) Decubitus Ulcers on buttocks -Stage 2 -wound care RN mx this.   -continue treatment as per schedule -labs D1 and D8 every cycle -continue Pamidronate q4weeks -RTC With Dr Irene Limbo C4D4  All of the patients questions were answered with apparent satisfaction. The patient knows to call the clinic with any problems, questions or concerns.  I spent 25 minutes counseling the patient face to face. The total time spent in the appointment was 30 minutes and more than 50% was on counseling and  direct patient cares.    Sullivan Lone MD [place] AAHIVMS [facility] Hematology/Oncology Physician [facility]  (Office):       [phone] (Work cell):  [phone] (Fax):           [phone]

## 2016-12-29 ENCOUNTER — Other Ambulatory Visit (HOSPITAL_BASED_OUTPATIENT_CLINIC_OR_DEPARTMENT_OTHER): Payer: Medicare HMO

## 2016-12-29 ENCOUNTER — Ambulatory Visit (HOSPITAL_BASED_OUTPATIENT_CLINIC_OR_DEPARTMENT_OTHER): Payer: Medicare HMO

## 2016-12-29 VITALS — BP 112/55 | HR 72 | Temp 98.6°F | Resp 18

## 2016-12-29 DIAGNOSIS — C9000 Multiple myeloma not having achieved remission: Secondary | ICD-10-CM | POA: Diagnosis not present

## 2016-12-29 DIAGNOSIS — Z5112 Encounter for antineoplastic immunotherapy: Secondary | ICD-10-CM

## 2016-12-29 LAB — CBC & DIFF AND RETIC
BASO%: 1 % (ref 0.0–2.0)
BASOS ABS: 0 10*3/uL (ref 0.0–0.1)
EOS ABS: 0 10*3/uL (ref 0.0–0.5)
EOS%: 1.3 % (ref 0.0–7.0)
HCT: 32.8 % — ABNORMAL LOW (ref 38.4–49.9)
HEMOGLOBIN: 10.4 g/dL — AB (ref 13.0–17.1)
IMMATURE RETIC FRACT: 8.5 % (ref 3.00–10.60)
LYMPH#: 0.3 10*3/uL — AB (ref 0.9–3.3)
LYMPH%: 8.6 % — AB (ref 14.0–49.0)
MCH: 30.6 pg (ref 27.2–33.4)
MCHC: 31.7 g/dL — ABNORMAL LOW (ref 32.0–36.0)
MCV: 96.5 fL (ref 79.3–98.0)
MONO#: 0.2 10*3/uL (ref 0.1–0.9)
MONO%: 6.1 % (ref 0.0–14.0)
NEUT#: 2.6 10*3/uL (ref 1.5–6.5)
NEUT%: 83 % — AB (ref 39.0–75.0)
PLATELETS: 180 10*3/uL (ref 140–400)
RBC: 3.4 10*6/uL — ABNORMAL LOW (ref 4.20–5.82)
RDW: 17.4 % — ABNORMAL HIGH (ref 11.0–14.6)
RETIC CT ABS: 43.86 10*3/uL (ref 34.80–93.90)
Retic %: 1.29 % (ref 0.80–1.80)
WBC: 3.1 10*3/uL — ABNORMAL LOW (ref 4.0–10.3)

## 2016-12-29 LAB — COMPREHENSIVE METABOLIC PANEL
ALBUMIN: 3.3 g/dL — AB (ref 3.5–5.0)
ALT: 19 U/L (ref 0–55)
ANION GAP: 6 meq/L (ref 3–11)
AST: 27 U/L (ref 5–34)
Alkaline Phosphatase: 178 U/L — ABNORMAL HIGH (ref 40–150)
BILIRUBIN TOTAL: 0.82 mg/dL (ref 0.20–1.20)
BUN: 19.7 mg/dL (ref 7.0–26.0)
CO2: 27 mEq/L (ref 22–29)
CREATININE: 1 mg/dL (ref 0.7–1.3)
Calcium: 8.8 mg/dL (ref 8.4–10.4)
Chloride: 111 mEq/L — ABNORMAL HIGH (ref 98–109)
EGFR: 79 mL/min/{1.73_m2} — AB (ref >90)
GLUCOSE: 102 mg/dL (ref 70–140)
Potassium: 4.1 mEq/L (ref 3.5–5.1)
Sodium: 144 mEq/L (ref 136–145)
TOTAL PROTEIN: 5.3 g/dL — AB (ref 6.4–8.3)

## 2016-12-29 MED ORDER — ONDANSETRON HCL 8 MG PO TABS
8.0000 mg | ORAL_TABLET | Freq: Once | ORAL | Status: AC
  Administered 2016-12-29: 8 mg via ORAL

## 2016-12-29 MED ORDER — BORTEZOMIB CHEMO SQ INJECTION 3.5 MG (2.5MG/ML)
1.3000 mg/m2 | Freq: Once | INTRAMUSCULAR | Status: AC
  Administered 2016-12-29: 3 mg via SUBCUTANEOUS
  Filled 2016-12-29: qty 3

## 2016-12-29 MED ORDER — ONDANSETRON HCL 8 MG PO TABS
ORAL_TABLET | ORAL | Status: AC
  Filled 2016-12-29: qty 1

## 2016-12-29 NOTE — Patient Instructions (Signed)
[facility] Discharge Instructions for Patients Receiving Chemotherapy  Today you received the following chemotherapy agents Velcade. To help prevent nausea and vomiting after your treatment, we encourage you to take your nausea medication as directed.  If you develop nausea and vomiting that is not controlled by your nausea medication, call the clinic.   BELOW ARE SYMPTOMS THAT SHOULD BE REPORTED IMMEDIATELY:  *FEVER GREATER THAN 100.5 F  *CHILLS WITH OR WITHOUT FEVER  NAUSEA AND VOMITING THAT IS NOT CONTROLLED WITH YOUR NAUSEA MEDICATION  *UNUSUAL SHORTNESS OF BREATH  *UNUSUAL BRUISING OR BLEEDING  TENDERNESS IN MOUTH AND THROAT WITH OR WITHOUT PRESENCE OF ULCERS  *URINARY PROBLEMS  *BOWEL PROBLEMS  UNUSUAL RASH Items with * indicate a potential emergency and should be followed up as soon as possible.  Feel free to call the clinic you have any questions or concerns. The clinic phone number is [phone].  Please show the CHEMO ALERT CARD at check-in to the Emergency Department and triage nurse.    

## 2016-12-30 ENCOUNTER — Encounter: Payer: Self-pay | Admitting: *Deleted

## 2016-12-30 ENCOUNTER — Other Ambulatory Visit: Payer: Self-pay | Admitting: *Deleted

## 2016-12-30 LAB — KAPPA/LAMBDA LIGHT CHAINS
IG LAMBDA FREE LIGHT CHAIN: 8.6 mg/L (ref 5.7–26.3)
Ig Kappa Free Light Chain: 49.9 mg/L — ABNORMAL HIGH (ref 3.3–19.4)
KAPPA/LAMBDA FLC RATIO: 5.8 — AB (ref 0.26–1.65)

## 2016-12-30 MED ORDER — CYCLOPHOSPHAMIDE 50 MG PO TABS: 200.0000 mg/m2 | ORAL_TABLET | ORAL | 1 refills | Status: DC

## 2017-01-01 ENCOUNTER — Ambulatory Visit (HOSPITAL_BASED_OUTPATIENT_CLINIC_OR_DEPARTMENT_OTHER): Payer: Medicare HMO | Admitting: Hematology

## 2017-01-01 ENCOUNTER — Ambulatory Visit (HOSPITAL_BASED_OUTPATIENT_CLINIC_OR_DEPARTMENT_OTHER): Payer: Medicare HMO

## 2017-01-01 ENCOUNTER — Encounter: Payer: Self-pay | Admitting: Hematology

## 2017-01-01 VITALS — BP 103/70 | HR 82 | Temp 97.9°F | Resp 18 | Wt 193.9 lb

## 2017-01-01 DIAGNOSIS — C9000 Multiple myeloma not having achieved remission: Secondary | ICD-10-CM | POA: Diagnosis not present

## 2017-01-01 DIAGNOSIS — D696 Thrombocytopenia, unspecified: Secondary | ICD-10-CM

## 2017-01-01 DIAGNOSIS — D649 Anemia, unspecified: Secondary | ICD-10-CM | POA: Diagnosis not present

## 2017-01-01 DIAGNOSIS — E538 Deficiency of other specified B group vitamins: Secondary | ICD-10-CM

## 2017-01-01 DIAGNOSIS — Z5112 Encounter for antineoplastic immunotherapy: Secondary | ICD-10-CM | POA: Diagnosis not present

## 2017-01-01 DIAGNOSIS — N189 Chronic kidney disease, unspecified: Secondary | ICD-10-CM

## 2017-01-01 DIAGNOSIS — I2510 Atherosclerotic heart disease of native coronary artery without angina pectoris: Secondary | ICD-10-CM

## 2017-01-01 LAB — MULTIPLE MYELOMA PANEL, SERUM
ALBUMIN/GLOB SERPL: 1.6 (ref 0.7–1.7)
ALPHA2 GLOB SERPL ELPH-MCNC: 0.7 g/dL (ref 0.4–1.0)
Albumin SerPl Elph-Mcnc: 3.1 g/dL (ref 2.9–4.4)
Alpha 1: 0.3 g/dL (ref 0.0–0.4)
B-GLOBULIN SERPL ELPH-MCNC: 0.7 g/dL (ref 0.7–1.3)
GAMMA GLOB SERPL ELPH-MCNC: 0.4 g/dL (ref 0.4–1.8)
GLOBULIN, TOTAL: 2 g/dL — AB (ref 2.2–3.9)
IGG (IMMUNOGLOBIN G), SERUM: 474 mg/dL — AB (ref 700–1600)
IGM (IMMUNOGLOBIN M), SRM: 11 mg/dL — AB (ref 20–172)
IgA, Qn, Serum: 18 mg/dL — ABNORMAL LOW (ref 61–437)
Total Protein: 5.1 g/dL — ABNORMAL LOW (ref 6.0–8.5)

## 2017-01-01 MED ORDER — ONDANSETRON HCL 8 MG PO TABS
8.0000 mg | ORAL_TABLET | Freq: Once | ORAL | Status: AC
  Administered 2017-01-01: 8 mg via ORAL

## 2017-01-01 MED ORDER — BORTEZOMIB CHEMO SQ INJECTION 3.5 MG (2.5MG/ML)
1.3000 mg/m2 | Freq: Once | INTRAMUSCULAR | Status: AC
  Administered 2017-01-01: 3 mg via SUBCUTANEOUS
  Filled 2017-01-01: qty 3

## 2017-01-01 MED ORDER — ONDANSETRON HCL 8 MG PO TABS
ORAL_TABLET | ORAL | Status: AC
  Filled 2017-01-01: qty 1

## 2017-01-05 ENCOUNTER — Ambulatory Visit (HOSPITAL_BASED_OUTPATIENT_CLINIC_OR_DEPARTMENT_OTHER): Payer: Medicare HMO

## 2017-01-05 ENCOUNTER — Ambulatory Visit: Payer: Medicare HMO | Admitting: Nutrition

## 2017-01-05 ENCOUNTER — Ambulatory Visit: Payer: Medicare HMO

## 2017-01-05 VITALS — BP 124/75 | HR 73 | Temp 97.6°F | Resp 17

## 2017-01-05 DIAGNOSIS — C9000 Multiple myeloma not having achieved remission: Secondary | ICD-10-CM | POA: Diagnosis not present

## 2017-01-05 DIAGNOSIS — Z5112 Encounter for antineoplastic immunotherapy: Secondary | ICD-10-CM

## 2017-01-05 LAB — CBC & DIFF AND RETIC
BASO%: 0.2 % (ref 0.0–2.0)
Basophils Absolute: 0 10*3/uL (ref 0.0–0.1)
EOS ABS: 0 10*3/uL (ref 0.0–0.5)
EOS%: 0.5 % (ref 0.0–7.0)
HCT: 33.4 % — ABNORMAL LOW (ref 38.4–49.9)
HGB: 10.9 g/dL — ABNORMAL LOW (ref 13.0–17.1)
Immature Retic Fract: 9.3 % (ref 3.00–10.60)
LYMPH%: 7.1 % — AB (ref 14.0–49.0)
MCH: 30.4 pg (ref 27.2–33.4)
MCHC: 32.6 g/dL (ref 32.0–36.0)
MCV: 93 fL (ref 79.3–98.0)
MONO#: 0.2 10*3/uL (ref 0.1–0.9)
MONO%: 4.8 % (ref 0.0–14.0)
NEUT%: 87.4 % — ABNORMAL HIGH (ref 39.0–75.0)
NEUTROS ABS: 3.8 10*3/uL (ref 1.5–6.5)
NRBC: 1 % — AB (ref 0–0)
RBC: 3.59 10*6/uL — AB (ref 4.20–5.82)
RDW: 16.6 % — AB (ref 11.0–14.6)
Retic %: 0.52 % — ABNORMAL LOW (ref 0.80–1.80)
Retic Ct Abs: 18.67 10*3/uL — ABNORMAL LOW (ref 34.80–93.90)
WBC: 4.4 10*3/uL (ref 4.0–10.3)
lymph#: 0.3 10*3/uL — ABNORMAL LOW (ref 0.9–3.3)

## 2017-01-05 LAB — COMPREHENSIVE METABOLIC PANEL
ALT: 23 U/L (ref 0–55)
ANION GAP: 7 meq/L (ref 3–11)
AST: 29 U/L (ref 5–34)
Albumin: 3.3 g/dL — ABNORMAL LOW (ref 3.5–5.0)
Alkaline Phosphatase: 149 U/L (ref 40–150)
BILIRUBIN TOTAL: 1.21 mg/dL — AB (ref 0.20–1.20)
BUN: 19.6 mg/dL (ref 7.0–26.0)
CO2: 25 meq/L (ref 22–29)
Calcium: 9 mg/dL (ref 8.4–10.4)
Chloride: 107 mEq/L (ref 98–109)
Creatinine: 1 mg/dL (ref 0.7–1.3)
EGFR: 76 mL/min/{1.73_m2} — AB (ref >90)
GLUCOSE: 102 mg/dL (ref 70–140)
POTASSIUM: 3.8 meq/L (ref 3.5–5.1)
SODIUM: 140 meq/L (ref 136–145)
Total Protein: 5.3 g/dL — ABNORMAL LOW (ref 6.4–8.3)

## 2017-01-05 LAB — TECHNOLOGIST REVIEW

## 2017-01-05 MED ORDER — SODIUM CHLORIDE 0.9 % IV SOLN
Freq: Once | INTRAVENOUS | Status: AC
  Administered 2017-01-05: 13:00:00 via INTRAVENOUS

## 2017-01-05 MED ORDER — ONDANSETRON HCL 8 MG PO TABS
ORAL_TABLET | ORAL | Status: AC
  Filled 2017-01-05: qty 1

## 2017-01-05 MED ORDER — BORTEZOMIB CHEMO SQ INJECTION 3.5 MG (2.5MG/ML)
1.3000 mg/m2 | Freq: Once | INTRAMUSCULAR | Status: AC
  Administered 2017-01-05: 3 mg via SUBCUTANEOUS
  Filled 2017-01-05: qty 3

## 2017-01-05 MED ORDER — ONDANSETRON HCL 8 MG PO TABS
8.0000 mg | ORAL_TABLET | Freq: Once | ORAL | Status: AC
  Administered 2017-01-05: 8 mg via ORAL

## 2017-01-05 MED ORDER — SODIUM CHLORIDE 0.9 % IV SOLN
60.0000 mg | Freq: Once | INTRAVENOUS | Status: DC
  Filled 2017-01-05: qty 10

## 2017-01-05 MED ORDER — SODIUM CHLORIDE 0.9 % IV SOLN
60.0000 mg | Freq: Once | INTRAVENOUS | Status: AC
  Administered 2017-01-05: 60 mg via INTRAVENOUS
  Filled 2017-01-05: qty 20

## 2017-01-05 NOTE — Patient Instructions (Signed)
[facility] Discharge Instructions for Patients Receiving Chemotherapy  Today you received the following chemotherapy agents:  Velcade (bortezomib)  To help prevent nausea and vomiting after your treatment, we encourage you to take your nausea medication as prescribed.   If you develop nausea and vomiting that is not controlled by your nausea medication, call the clinic.   BELOW ARE SYMPTOMS THAT SHOULD BE REPORTED IMMEDIATELY:  *FEVER GREATER THAN 100.5 F  *CHILLS WITH OR WITHOUT FEVER  NAUSEA AND VOMITING THAT IS NOT CONTROLLED WITH YOUR NAUSEA MEDICATION  *UNUSUAL SHORTNESS OF BREATH  *UNUSUAL BRUISING OR BLEEDING  TENDERNESS IN MOUTH AND THROAT WITH OR WITHOUT PRESENCE OF ULCERS  *URINARY PROBLEMS  *BOWEL PROBLEMS  UNUSUAL RASH Items with * indicate a potential emergency and should be followed up as soon as possible.  Feel free to call the clinic you have any questions or concerns. The clinic phone number is [phone].  Please show the [place] at check-in to the Emergency Department and triage nurse.     Pamidronate injection What is this medicine? PAMIDRONATE (pa mi DROE nate) slows calcium loss from bones. It is used to treat high calcium blood levels from cancer or Paget's disease. It is also used to treat bone pain and prevent fractures from certain cancers that have spread to the bone. This medicine may be used for other purposes; ask your health care provider or pharmacist if you have questions. COMMON BRAND NAME(S): Aredia What should I tell my health care provider before I take this medicine? They need to know if you have any of these conditions: -aspirin-sensitive asthma -dental disease -kidney disease -an unusual or allergic reaction to pamidronate, other medicines, foods, dyes, or preservatives -pregnant or trying to get pregnant -breast-feeding How should I use this medicine? This medicine is for infusion into a vein. It  is given by a health care professional in a hospital or clinic setting. Talk to your pediatrician regarding the use of this medicine in children. This medicine is not approved for use in children. Overdosage: If you think you have taken too much of this medicine contact a poison control center or emergency room at once. NOTE: This medicine is only for you. Do not share this medicine with others. What if I miss a dose? This does not apply. What may interact with this medicine? -certain antibiotics given by injection -medicines for inflammation or pain like ibuprofen, naproxen -some diuretics like bumetanide, furosemide -cyclosporine -parathyroid hormone -tacrolimus -teriparatide -thalidomide This list may not describe all possible interactions. Give your health care provider a list of all the medicines, herbs, non-prescription drugs, or dietary supplements you use. Also tell them if you smoke, drink alcohol, or use illegal drugs. Some items may interact with your medicine. What should I watch for while using this medicine? Visit your doctor or health care professional for regular checkups. It may be some time before you see the benefit from this medicine. Do not stop taking your medicine unless your doctor tells you to. Your doctor may order blood tests or other tests to see how you are doing. Women should inform their doctor if they wish to become pregnant or think they might be pregnant. There is a potential for serious side effects to an unborn child. Talk to your health care professional or pharmacist for more information. You should make sure that you get enough calcium and vitamin D while you are taking this medicine. Discuss the foods you eat and the  vitamins you take with your health care professional. Some people who take this medicine have severe bone, joint, and/or muscle pain. This medicine may also increase your risk for a broken thigh bone. Tell your doctor right away if you have  pain in your upper leg or groin. Tell your doctor if you have any pain that does not go away or that gets worse. What side effects may I notice from receiving this medicine? Side effects that you should report to your doctor or health care professional as soon as possible: -allergic reactions like skin rash, itching or hives, swelling of the face, lips, or tongue -black or tarry stools -changes in vision -eye inflammation, pain -high blood pressure -jaw pain, especially burning or cramping -muscle weakness -numb, tingling pain -swelling of feet or hands -trouble passing urine or change in the amount of urine -unable to move easily Side effects that usually do not require medical attention (report to your doctor or health care professional if they continue or are bothersome): -bone, joint, or muscle pain -constipation -dizzy, drowsy -fever -headache -loss of appetite -nausea, vomiting -pain at site where injected This list may not describe all possible side effects. Call your doctor for medical advice about side effects. You may report side effects to FDA at 1-800-FDA-1088. Where should I keep my medicine? This drug is given in a hospital or clinic and will not be stored at home. NOTE: This sheet is a summary. It may not cover all possible information. If you have questions about this medicine, talk to your doctor, pharmacist, or health care provider.  2018 Elsevier/Gold Standard (2011-04-04 08:49:49)

## 2017-01-05 NOTE — Progress Notes (Signed)
Patient was identified to be at risk for malnutrition on the MST secondary to weight loss and poor appetite.  Patient is a 70-year-old male diagnosed with multiple myeloma and is a patient of Dr. Kale.  Past medical history includes diabetes type 2, sleep apnea, hypertension, hypercholesterolemia, GERD, CAD, and proximal LAD.  Medications include Lipitor, Dulcolax, vitamin D3, vitamin B12, Decadron, Lasix, NovoLog  Labs include albumin 3.3, glucose 102.  Height: 5 feet 10 inches. Weight: 193.9 pounds March 15. Usual body weight: 262 pounds October 2017. BMI: 27.82.  Patient reports he has stage II decubitus on buttocks which have improved and are healing well and decreasing in size. He reports that he has diarrhea for 3 days almost every week. He has been taking Imodium, but follows package directions. He states he does have nausea. However, nausea is improved when taking Zofran.   Dietary recall reveals patient is eating very small amounts of food, especially on days when he has diarrhea or nausea. States he likes oral nutrition supplements but refuses to drink them. Reports poor appetite.  Nutrition diagnosis:  Severe malnutrition related to inadequate oral intake as evidenced by 26% weight loss in 6 months and less than or equal to 75% energy intake for greater than one month along with severe depletion of body fat and muscle mass.  Intervention: Educated patient to increase calories and protein in 6 small meals and snacks daily. Recommended patient consume oral nutrition supplements twice a day. Educated patient on strategies for eating with diarrhea.  I provided a fact sheet. Asked nursing to provide additional instructions on how to take Imodium with diarrhea. Explained importance of protein and calories for healing. Questions were answered.  Teach back method used.  Contact information given.  Monitoring, evaluation, goals:  Patient will tolerate increased calories and  protein to minimize further weight loss.  Next visit: To be scheduled with treatments.  **Disclaimer: This note was dictated with voice recognition software. Similar sounding words can inadvertently be transcribed and this note may contain transcription errors which may not have been corrected upon publication of note.**   

## 2017-01-06 LAB — KAPPA/LAMBDA LIGHT CHAINS
IG LAMBDA FREE LIGHT CHAIN: 3.5 mg/L — AB (ref 5.7–26.3)
Ig Kappa Free Light Chain: 26.3 mg/L — ABNORMAL HIGH (ref 3.3–19.4)
Kappa/Lambda FluidC Ratio: 7.51 — ABNORMAL HIGH (ref 0.26–1.65)

## 2017-01-06 NOTE — Progress Notes (Signed)
Richard Vang    HEMATOLOGY/ONCOLOGY CLINIC NOTE  Date of Service: .01/01/2017  Patient Care Team: Sandi Mariscal, MD as PCP - General (Internal Medicine)  CHIEF COMPLAINTS/PURPOSE OF CONSULTATION:  f/u for Multiple Myeloma  HISTORY OF PRESENTING ILLNESS:  plz see previous note for details on HPI  INTERVAL HISTORY  Richard Vang is here for his scheduled followup for myeloma prior to C4D4  Velcade. Notes back pain is well controlled. Decubtus ulcer is healing well. Good appetite and improved energy levels. No SOB/CP No fevers/chills/night sweats. No overt bleeding. Platelet improved to 180k. Serum Kappa FLC down to 49 and continue to improve. Other labs stable.   MEDICAL HISTORY:  Past Medical History:  Diagnosis Date  . Arthritis    "left shoulder" (07/31/2016)  . Coronary artery disease   . GERD (gastroesophageal reflux disease)   . Heart murmur   . High cholesterol   . Hypertension   . Multiple myeloma ([place])   . Sleep apnea    "probably; having test in November" (07/31/2016)  . Type II diabetes mellitus ([place])     SURGICAL HISTORY: Past Surgical History:  Procedure Laterality Date  . CARDIAC CATHETERIZATION N/A 07/31/2016   Procedure: Left Heart Cath and Coronary Angiography;  Surgeon: Lorretta Harp, MD;  Location: [facility];  Service: Cardiovascular;  Laterality: N/A;  . CARDIAC CATHETERIZATION N/A 07/31/2016   Procedure: Coronary Stent Intervention;  Surgeon: Lorretta Harp, MD;  Location: [facility];  Service: Cardiovascular;  Laterality: N/A;  . CORONARY ANGIOPLASTY    . SHOULDER SURGERY Left 1973   "put pin in it where it had separated"   . TONSILLECTOMY  ~ 1956    SOCIAL HISTORY: Social History   Social History  . Marital status: Divorced    Spouse name: N/A  . Number of children: N/A  . Years of education: N/A   Occupational History  . Not on file.   Social History Main Topics  . Smoking status: Never Smoker  . Smokeless tobacco: Never Used    . Alcohol use Yes     Comment: 07/31/2016 "nothing since 2002"  . Drug use: No  . Sexual activity: Not Currently   Other Topics Concern  . Not on file   Social History Narrative  . No narrative on file    FAMILY HISTORY: Family History  Problem Relation Age of Onset  . Hypertension Other     ALLERGIES:  has No Known Allergies.  MEDICATIONS:  Current Outpatient Prescriptions  Medication Sig Dispense Refill  . acyclovir (ZOVIRAX) 400 MG tablet Take 1 tablet (400 mg total) by mouth 2 (two) times daily. 60 tablet 3  . atorvastatin (LIPITOR) 40 MG tablet Take 1 tablet (40 mg total) by mouth at bedtime. 30 tablet 12  . bisacodyl (DULCOLAX) 5 MG EC tablet Take 1 tablet (5 mg total) by mouth daily as needed for moderate constipation. 30 tablet 0  . Cholecalciferol (VITAMIN D3) 5000 units CAPS Take 1 capsule by mouth daily.     . clopidogrel (PLAVIX) 75 MG tablet Take 1 tablet (75 mg total) by mouth daily with breakfast. 30 tablet 12  . Cyanocobalamin (B-12) 1000 MCG SUBL Place 1,000 mcg under the tongue daily. 30 each 3  . cyclophosphamide (CYTOXAN) 50 MG tablet Take 9 tablets (450 mg total) by mouth once a week. Days 1, 8, and 15 of each cycle give on an empty stomach in AM. 27 tablet 1  . dexamethasone (DECADRON) 4 MG tablet  Take 5 tablets (20 mg) on days 1, 8, and 15 of chemo. Repeat every 21 days. 30 tablet 3  . feeding supplement, ENSURE ENLIVE, (ENSURE ENLIVE) LIQD Take 237 mLs by mouth 2 (two) times daily between meals. 237 mL 12  . furosemide (LASIX) 40 MG tablet     . insulin aspart (NOVOLOG) 100 UNIT/ML injection Correction coverage: Sensitive (thin, NPO, renal)  CBG < 70: implement hypoglycemia protocol  CBG 70 - 120: 0 units  CBG 121 - 150: 1 unit  CBG 151 - 200: 2 units  CBG 201 - 250: 3 units  CBG 251 - 300: 5 units  CBG 301 - 350: 7 units  CBG 351 - 400 9 units  CBG > 400 call MD and obtain STAT lab verification 10 mL 11  . montelukast (SINGULAIR) 10 MG tablet Take  10 mg by mouth at bedtime.    Richard Vang morphine (MS CONTIN) 15 MG 12 hr tablet Take 1 tablet (15 mg total) by mouth every 12 (twelve) hours. 20 tablet 0  . omeprazole (PRILOSEC) 40 MG capsule     . ondansetron (ZOFRAN) 8 MG tablet Take 1 tablet (8 mg total) by mouth 2 (two) times daily as needed for refractory nausea / vomiting. Starting on days 4 and 11 not on day 8. 30 tablet 1  . oxyCODONE (OXY IR/ROXICODONE) 5 MG immediate release tablet Take 1 tablet (5 mg total) by mouth every 6 (six) hours as needed for severe pain. 60 tablet 0  . oxyCODONE (OXYCONTIN) 10 mg 12 hr tablet Take 1 tablet (10 mg total) by mouth every 12 (twelve) hours. 60 tablet 0  . polyethylene glycol (MIRALAX / GLYCOLAX) packet Take 17 g by mouth daily. 14 each 0  . potassium chloride SA (K-DUR,KLOR-CON) 20 MEQ tablet Take 1 tablet (20 mEq total) by mouth daily.    . prochlorperazine (COMPAZINE) 10 MG tablet Take 1 tablet (10 mg total) by mouth every 6 (six) hours as needed (Nausea or vomiting). 30 tablet 1  . warfarin (COUMADIN) 6 MG tablet Take 4 mg by mouth every morning.     . carvedilol (COREG) 12.5 MG tablet Take 1 tablet (12.5 mg total) by mouth 2 (two) times daily with a meal. 60 tablet 2  . nitroGLYCERIN (NITROSTAT) 0.3 MG SL tablet Place 0.3 mg under the tongue as needed for chest pain.     No current facility-administered medications for this visit.     REVIEW OF SYSTEMS:    10 Point review of Systems was done is negative except as noted above.  PHYSICAL EXAMINATION: ECOG PERFORMANCE STATUS: 3 - Symptomatic, >50% confined to bed  . Vitals:   01/01/17 1335  BP: 103/70  Pulse: 82  Resp: 18  Temp: 97.9 F (36.6 C)   Filed Weights   01/01/17 1335  Weight: 193 lb 14.4 oz (88 kg)   .Body mass index is 27.82 kg/m.  GENERAL: NAD SKIN: petechiae over upper extremities OROPHARYNX: MMM  NECK: supple,+ JVD, LYMPH: no palpable lymphadenopathy in the cervical, axillary or inguinal LUNGS: CTA b/l HEART: S1-S2  irreg ABDOMEN: abdomen soft, non-tender, normoactive bowel sounds, no hepatosplenomegaly palpable PSYCH: Alert and oriented 3  NEURO: non focal. LE : b/l trace pedal edema   LABORATORY DATA:  I have reviewed the data as listed . CBC Latest Ref Rng & Units 01/05/2017 12/29/2016 12/18/2016  WBC 4.0 - 10.3 10e3/uL 4.4 3.1(L) 3.6(L)  Hemoglobin 13.0 - 17.1 g/dL 10.9(L) 10.4(L) 10.9(L)  Hematocrit 38.4 -  49.9 % 33.4(L) 32.8(L) 33.5(L)  Platelets 140 - 400 10e3/uL 108 Large platelets present(L) 180 62 Large platelets present(L)   . CMP Latest Ref Rng & Units 01/05/2017 12/29/2016 12/29/2016  Glucose 70 - 140 mg/dl 102 102 -  BUN 7.0 - 26.0 mg/dL 19.6 19.7 -  Creatinine 0.7 - 1.3 mg/dL 1.0 1.0 -  Sodium 136 - 145 mEq/L 140 144 -  Potassium 3.5 - 5.1 mEq/L 3.8 4.1 -  Chloride 101 - 111 mmol/L - - -  CO2 22 - 29 mEq/L 25 27 -  Calcium 8.4 - 10.4 mg/dL 9.0 8.8 -  Total Protein 6.4 - 8.3 g/dL 5.3(L) 5.3(L) 5.1(L)  Total Bilirubin 0.20 - 1.20 mg/dL 1.21(H) 0.82 -  Alkaline Phos 40 - 150 U/L 149 178(H) -  AST 5 - 34 U/L 29 27 -  ALT 0 - 55 U/L 23 19 -      RADIOGRAPHIC STUDIES: I have personally reviewed the radiological images as listed and agreed with the findings in the report. No results found.  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  K/L 95, No M spike on SPEP -- suggests light chain MM Cytogenetics - normal male chromosomes FISH- +11  and 13q-/-13  Patient is s/p 1 cycle of Vd and Serum free kappa LC has decreased from 700 to 203.6 with improvement in his K/L ratio from 94.59 to 29. Completed 2nd cycle of treatment with Vd + Cytoxan (266m/m2) Completed 3rd cycle of treatment with Vd + Cytoxan (2061mm2) - cytoxan held D15 due to thrombocytopenia  Kappa free light chains have improved progressively from 700 down to around 49 with improivng K/L ratio.  2) severe hypercalcemia calcium a more than 15 now down to normal 9 This is likely due to MM resolved with bisphosphonates, forced saline diuresis and Steroids in the hospital  3) CAD s/p prox LAD DES PCI on 07/31/2016 with ischemic cardiomyopathy ejection fraction of 25-35%. Rpt ECHO on 10/03/2016 shows improvement in EF to 55-60% with no RWMABN.  4) Macrocytic Anemia with moderate thrombocytopenia - due to MM and B12 def hgb 10.9  5) Thrombocytopenia - due to MM and treatment -- plt down 180k on 3/12 today. No bleeding.  5) AKI on CKD -- related to hypercalcemia vs ?cardiorenal syndrome vs MM - improved. Creatinine is improved  To 0.8. Patient has diuresed well and most of the 3rd spaced fluid has resolved.  6) B12 deficiency - Received [place] B12 daily while in the hopsital -cont SL B12 100061mpo daily  PLAN --continue Velcade D1,4,8,11. C4 D4 today --continue Cytoxan/Velcade/Dex -continue on antiplatelet therapy  -on Acyclovir prophylaxis -continue q4weeks IV Pamidronate  -Transfuse when necessary to keep the hemoglobin close to/above 9given his significant cardiac comorbidities. -on Oxycontin and prn oxycodone for neoplasm related pain control  6) b/l Lower extremity swelling likely due to CHF an CKD. Plan -continued adjustment and monitoring of diuretic therapy as per PCP and cardiology -restarted on potassium due to hypokalemia  .7) CAD s/p recent DES 8) Afib with  RVR on coumadin -continue mx per PCP and cardiology  9) Decubitus Ulcers on  buttocks -Stage 2 - improving/healing. -wound care RN mx this.   -continue treatment as per schedule -continue Pamidronate q4weeks -RTC with Dr Irene Limbo C5D1 with labs  All of the patients questions were answered with apparent satisfaction. The patient knows to call the clinic with any problems, questions or concerns.  I spent 20 minutes counseling the patient face to face. The total time spent in the appointment was 25 minutes and more than 50% was on counseling and direct patient cares.    Sullivan Lone MD [place] AAHIVMS [facility] Hematology/Oncology Physician [facility]  (Office):       [phone] (Work cell):  [phone] (Fax):           [phone]

## 2017-01-07 LAB — MULTIPLE MYELOMA PANEL, SERUM
ALBUMIN SERPL ELPH-MCNC: 3.4 g/dL (ref 2.9–4.4)
ALPHA2 GLOB SERPL ELPH-MCNC: 0.5 g/dL (ref 0.4–1.0)
Albumin/Glob SerPl: 1.8 — ABNORMAL HIGH (ref 0.7–1.7)
Alpha 1: 0.2 g/dL (ref 0.0–0.4)
B-GLOBULIN SERPL ELPH-MCNC: 0.8 g/dL (ref 0.7–1.3)
Gamma Glob SerPl Elph-Mcnc: 0.4 g/dL (ref 0.4–1.8)
Globulin, Total: 1.9 g/dL — ABNORMAL LOW (ref 2.2–3.9)
IGG (IMMUNOGLOBIN G), SERUM: 479 mg/dL — AB (ref 700–1600)
IGM (IMMUNOGLOBIN M), SRM: 10 mg/dL — AB (ref 20–172)
IgA, Qn, Serum: 17 mg/dL — ABNORMAL LOW (ref 61–437)
TOTAL PROTEIN: 5.3 g/dL — AB (ref 6.0–8.5)

## 2017-01-08 ENCOUNTER — Ambulatory Visit (HOSPITAL_BASED_OUTPATIENT_CLINIC_OR_DEPARTMENT_OTHER): Payer: Medicare HMO

## 2017-01-08 ENCOUNTER — Telehealth: Payer: Self-pay | Admitting: Hematology

## 2017-01-08 VITALS — BP 93/73 | HR 73 | Temp 97.8°F | Resp 18

## 2017-01-08 DIAGNOSIS — Z5112 Encounter for antineoplastic immunotherapy: Secondary | ICD-10-CM | POA: Diagnosis not present

## 2017-01-08 DIAGNOSIS — C9000 Multiple myeloma not having achieved remission: Secondary | ICD-10-CM

## 2017-01-08 MED ORDER — SODIUM CHLORIDE 0.9 % IV SOLN
250.0000 mL | INTRAVENOUS | Status: DC
  Administered 2017-01-08: 250 mL via INTRAVENOUS

## 2017-01-08 MED ORDER — ONDANSETRON HCL 8 MG PO TABS
8.0000 mg | ORAL_TABLET | Freq: Once | ORAL | Status: AC
  Administered 2017-01-08: 8 mg via ORAL

## 2017-01-08 MED ORDER — ONDANSETRON HCL 8 MG PO TABS
ORAL_TABLET | ORAL | Status: AC
  Filled 2017-01-08: qty 1

## 2017-01-08 MED ORDER — BORTEZOMIB CHEMO SQ INJECTION 3.5 MG (2.5MG/ML)
1.3000 mg/m2 | Freq: Once | INTRAMUSCULAR | Status: AC
  Administered 2017-01-08: 3 mg via SUBCUTANEOUS
  Filled 2017-01-08: qty 3

## 2017-01-08 NOTE — Progress Notes (Signed)
Notified Dr. Irene Limbo of BP, pt's report of diarrhea and recent fall. Order received for normal saline 263mL over 50min- 1 hour today. OK to proceed with Velcade today. MD aware of creatinine 1.2 on 3/19. No lab needed today,per Dr. Irene Limbo.

## 2017-01-08 NOTE — Telephone Encounter (Signed)
appts made and avs printed per LOS

## 2017-01-08 NOTE — Patient Instructions (Signed)
[facility] Discharge Instructions for Patients Receiving Chemotherapy  Today you received the following chemotherapy agentsL Velcade.  To help prevent nausea and vomiting after your treatment, we encourage you to take your nausea medication: Zofran. Take one every 8 hours as needed.  If you develop nausea and vomiting that is not controlled by your nausea medication, call the clinic.   BELOW ARE SYMPTOMS THAT SHOULD BE REPORTED IMMEDIATELY:  *FEVER GREATER THAN 100.5 F  *CHILLS WITH OR WITHOUT FEVER  NAUSEA AND VOMITING THAT IS NOT CONTROLLED WITH YOUR NAUSEA MEDICATION  *UNUSUAL SHORTNESS OF BREATH  *UNUSUAL BRUISING OR BLEEDING  TENDERNESS IN MOUTH AND THROAT WITH OR WITHOUT PRESENCE OF ULCERS  *URINARY PROBLEMS  *BOWEL PROBLEMS  UNUSUAL RASH Items with * indicate a potential emergency and should be followed up as soon as possible.  Feel free to call the clinic should you have any questions or concerns. The clinic phone number is [phone].  Please show the [facility] at check-in to the Emergency Department and triage nurse.

## 2017-01-12 NOTE — Progress Notes (Signed)
RX for Oxycodone and Oral cytoxan delivered to pt in chemo room.  Calendar written out with instructions to take oral cytoxan & decadron on days 10/27/13 of cycle.  Instructed pt to inform PCP that cytoxan can interfere with coumadin levels.  Letter faxed to Dr. Lynnda Child office to notify pt starting on cytoxan.  Pt/significant other verbalized understanding.

## 2017-01-19 ENCOUNTER — Ambulatory Visit (HOSPITAL_BASED_OUTPATIENT_CLINIC_OR_DEPARTMENT_OTHER): Payer: Medicare HMO

## 2017-01-19 ENCOUNTER — Other Ambulatory Visit (HOSPITAL_BASED_OUTPATIENT_CLINIC_OR_DEPARTMENT_OTHER): Payer: Medicare HMO

## 2017-01-19 VITALS — BP 111/65 | HR 71 | Temp 97.8°F | Resp 16

## 2017-01-19 DIAGNOSIS — Z5112 Encounter for antineoplastic immunotherapy: Secondary | ICD-10-CM

## 2017-01-19 DIAGNOSIS — C9000 Multiple myeloma not having achieved remission: Secondary | ICD-10-CM | POA: Diagnosis not present

## 2017-01-19 LAB — CBC & DIFF AND RETIC
BASO%: 1.2 % (ref 0.0–2.0)
Basophils Absolute: 0 10*3/uL (ref 0.0–0.1)
EOS%: 1.6 % (ref 0.0–7.0)
Eosinophils Absolute: 0 10*3/uL (ref 0.0–0.5)
HCT: 32.9 % — ABNORMAL LOW (ref 38.4–49.9)
HGB: 10.6 g/dL — ABNORMAL LOW (ref 13.0–17.1)
IMMATURE RETIC FRACT: 5.6 % (ref 3.00–10.60)
LYMPH#: 0.3 10*3/uL — AB (ref 0.9–3.3)
LYMPH%: 10.9 % — AB (ref 14.0–49.0)
MCH: 30.3 pg (ref 27.2–33.4)
MCHC: 32.2 g/dL (ref 32.0–36.0)
MCV: 94 fL (ref 79.3–98.0)
MONO#: 0.1 10*3/uL (ref 0.1–0.9)
MONO%: 4.3 % (ref 0.0–14.0)
NEUT%: 82 % — ABNORMAL HIGH (ref 39.0–75.0)
NEUTROS ABS: 2.1 10*3/uL (ref 1.5–6.5)
Platelets: 161 10*3/uL (ref 140–400)
RBC: 3.5 10*6/uL — AB (ref 4.20–5.82)
RDW: 16.7 % — ABNORMAL HIGH (ref 11.0–14.6)
RETIC CT ABS: 51.1 10*3/uL (ref 34.80–93.90)
Retic %: 1.46 % (ref 0.80–1.80)
WBC: 2.6 10*3/uL — AB (ref 4.0–10.3)

## 2017-01-19 LAB — COMPREHENSIVE METABOLIC PANEL
ALT: 21 U/L (ref 0–55)
AST: 23 U/L (ref 5–34)
Albumin: 3.4 g/dL — ABNORMAL LOW (ref 3.5–5.0)
Alkaline Phosphatase: 131 U/L (ref 40–150)
Anion Gap: 8 mEq/L (ref 3–11)
BILIRUBIN TOTAL: 0.91 mg/dL (ref 0.20–1.20)
BUN: 16.3 mg/dL (ref 7.0–26.0)
CHLORIDE: 112 meq/L — AB (ref 98–109)
CO2: 21 meq/L — AB (ref 22–29)
Calcium: 9.3 mg/dL (ref 8.4–10.4)
Creatinine: 0.9 mg/dL (ref 0.7–1.3)
EGFR: 85 mL/min/{1.73_m2} — AB (ref >90)
GLUCOSE: 138 mg/dL (ref 70–140)
Potassium: 4.1 mEq/L (ref 3.5–5.1)
SODIUM: 141 meq/L (ref 136–145)
TOTAL PROTEIN: 5.3 g/dL — AB (ref 6.4–8.3)

## 2017-01-19 MED ORDER — ONDANSETRON HCL 8 MG PO TABS
ORAL_TABLET | ORAL | Status: AC
  Filled 2017-01-19: qty 1

## 2017-01-19 MED ORDER — ONDANSETRON HCL 8 MG PO TABS
8.0000 mg | ORAL_TABLET | Freq: Once | ORAL | Status: AC
  Administered 2017-01-19: 8 mg via ORAL

## 2017-01-19 MED ORDER — BORTEZOMIB CHEMO SQ INJECTION 3.5 MG (2.5MG/ML)
1.3000 mg/m2 | Freq: Once | INTRAMUSCULAR | Status: AC
  Administered 2017-01-19: 3 mg via SUBCUTANEOUS
  Filled 2017-01-19: qty 3

## 2017-01-19 NOTE — Patient Instructions (Signed)
[facility] Discharge Instructions for Patients Receiving Chemotherapy  Today you received the following chemotherapy agents: Velcade.  To help prevent nausea and vomiting after your treatment, we encourage you to take your nausea medication: Zofran. Take one every 8 hours as needed.    If you develop nausea and vomiting that is not controlled by your nausea medication, call the clinic.   BELOW ARE SYMPTOMS THAT SHOULD BE REPORTED IMMEDIATELY:  *FEVER GREATER THAN 100.5 F  *CHILLS WITH OR WITHOUT FEVER  NAUSEA AND VOMITING THAT IS NOT CONTROLLED WITH YOUR NAUSEA MEDICATION  *UNUSUAL SHORTNESS OF BREATH  *UNUSUAL BRUISING OR BLEEDING  TENDERNESS IN MOUTH AND THROAT WITH OR WITHOUT PRESENCE OF ULCERS  *URINARY PROBLEMS  *BOWEL PROBLEMS  UNUSUAL RASH Items with * indicate a potential emergency and should be followed up as soon as possible.  Feel free to call the clinic should you have any questions or concerns. The clinic phone number is [phone].  Please show the CHEMO ALERT CARD at check-in to the Emergency Department and triage nurse.   

## 2017-01-20 LAB — MULTIPLE MYELOMA PANEL, SERUM
ALBUMIN/GLOB SERPL: 1.9 — AB (ref 0.7–1.7)
ALPHA2 GLOB SERPL ELPH-MCNC: 0.5 g/dL (ref 0.4–1.0)
Albumin SerPl Elph-Mcnc: 3.3 g/dL (ref 2.9–4.4)
Alpha 1: 0.2 g/dL (ref 0.0–0.4)
B-GLOBULIN SERPL ELPH-MCNC: 0.7 g/dL (ref 0.7–1.3)
GAMMA GLOB SERPL ELPH-MCNC: 0.5 g/dL (ref 0.4–1.8)
Globulin, Total: 1.8 g/dL — ABNORMAL LOW (ref 2.2–3.9)
IGG (IMMUNOGLOBIN G), SERUM: 490 mg/dL — AB (ref 700–1600)
IgA, Qn, Serum: 18 mg/dL — ABNORMAL LOW (ref 61–437)
IgM, Qn, Serum: 10 mg/dL — ABNORMAL LOW (ref 20–172)
TOTAL PROTEIN: 5.1 g/dL — AB (ref 6.0–8.5)

## 2017-01-20 LAB — KAPPA/LAMBDA LIGHT CHAINS
IG KAPPA FREE LIGHT CHAIN: 26.5 mg/L — AB (ref 3.3–19.4)
IG LAMBDA FREE LIGHT CHAIN: 6 mg/L (ref 5.7–26.3)
KAPPA/LAMBDA FLC RATIO: 4.42 — AB (ref 0.26–1.65)

## 2017-01-22 ENCOUNTER — Ambulatory Visit: Payer: Medicare HMO | Admitting: Nutrition

## 2017-01-22 ENCOUNTER — Ambulatory Visit (HOSPITAL_BASED_OUTPATIENT_CLINIC_OR_DEPARTMENT_OTHER): Payer: Medicare HMO

## 2017-01-22 VITALS — BP 126/79 | HR 70 | Temp 97.6°F | Resp 16

## 2017-01-22 DIAGNOSIS — C9000 Multiple myeloma not having achieved remission: Secondary | ICD-10-CM

## 2017-01-22 DIAGNOSIS — Z5112 Encounter for antineoplastic immunotherapy: Secondary | ICD-10-CM | POA: Diagnosis not present

## 2017-01-22 MED ORDER — ONDANSETRON HCL 8 MG PO TABS
8.0000 mg | ORAL_TABLET | Freq: Once | ORAL | Status: AC
  Administered 2017-01-22: 8 mg via ORAL

## 2017-01-22 MED ORDER — BORTEZOMIB CHEMO SQ INJECTION 3.5 MG (2.5MG/ML)
1.3000 mg/m2 | Freq: Once | INTRAMUSCULAR | Status: AC
  Administered 2017-01-22: 3 mg via SUBCUTANEOUS
  Filled 2017-01-22: qty 3

## 2017-01-22 MED ORDER — ONDANSETRON HCL 8 MG PO TABS
ORAL_TABLET | ORAL | Status: AC
  Filled 2017-01-22: qty 1

## 2017-01-22 NOTE — Patient Instructions (Signed)
[facility] Discharge Instructions for Patients Receiving Chemotherapy  Today you received the following chemotherapy agents: Velcade.  To help prevent nausea and vomiting after your treatment, we encourage you to take your nausea medication: Zofran. Take one every 8 hours as needed.    If you develop nausea and vomiting that is not controlled by your nausea medication, call the clinic.   BELOW ARE SYMPTOMS THAT SHOULD BE REPORTED IMMEDIATELY:  *FEVER GREATER THAN 100.5 F  *CHILLS WITH OR WITHOUT FEVER  NAUSEA AND VOMITING THAT IS NOT CONTROLLED WITH YOUR NAUSEA MEDICATION  *UNUSUAL SHORTNESS OF BREATH  *UNUSUAL BRUISING OR BLEEDING  TENDERNESS IN MOUTH AND THROAT WITH OR WITHOUT PRESENCE OF ULCERS  *URINARY PROBLEMS  *BOWEL PROBLEMS  UNUSUAL RASH Items with * indicate a potential emergency and should be followed up as soon as possible.  Feel free to call the clinic should you have any questions or concerns. The clinic phone number is [phone].  Please show the CHEMO ALERT CARD at check-in to the Emergency Department and triage nurse.   

## 2017-01-22 NOTE — Progress Notes (Signed)
Nutrition follow-up completed with patient receiving treatment for multiple myeloma. Patient denies current nutrition side effects. He reports stage II decubitus on buttocks is improving. Reports he is eating well.  Her weight was documented at 193.9 pounds on March 15.  Nutrition diagnosis: Severe malnutrition, improved.  Intervention: Educated patient to continue strategies for increased calories and protein to promote healing and weight maintenance. Teach back method used.  Monitoring, evaluation, goals:  Patient will consume adequate calories and protein for continued healing and weight maintenance  Patient will contact me for questions or concerns.  **Disclaimer: This note was dictated with voice recognition software. Similar sounding words can inadvertently be transcribed and this note may contain transcription errors which may not have been corrected upon publication of note.**

## 2017-01-26 ENCOUNTER — Other Ambulatory Visit: Payer: Self-pay | Admitting: *Deleted

## 2017-01-26 ENCOUNTER — Encounter: Payer: Self-pay | Admitting: Hematology

## 2017-01-26 ENCOUNTER — Ambulatory Visit (HOSPITAL_BASED_OUTPATIENT_CLINIC_OR_DEPARTMENT_OTHER): Payer: Medicare HMO | Admitting: Hematology

## 2017-01-26 ENCOUNTER — Ambulatory Visit (HOSPITAL_BASED_OUTPATIENT_CLINIC_OR_DEPARTMENT_OTHER): Payer: Medicare HMO

## 2017-01-26 ENCOUNTER — Other Ambulatory Visit (HOSPITAL_BASED_OUTPATIENT_CLINIC_OR_DEPARTMENT_OTHER): Payer: Medicare HMO

## 2017-01-26 VITALS — BP 108/59 | HR 55 | Temp 97.7°F | Resp 18 | Ht 70.0 in | Wt 177.4 lb

## 2017-01-26 VITALS — BP 106/78 | HR 66 | Ht 70.0 in | Wt 175.0 lb

## 2017-01-26 DIAGNOSIS — C9000 Multiple myeloma not having achieved remission: Secondary | ICD-10-CM

## 2017-01-26 DIAGNOSIS — E538 Deficiency of other specified B group vitamins: Secondary | ICD-10-CM

## 2017-01-26 DIAGNOSIS — D696 Thrombocytopenia, unspecified: Secondary | ICD-10-CM | POA: Diagnosis not present

## 2017-01-26 DIAGNOSIS — D649 Anemia, unspecified: Secondary | ICD-10-CM | POA: Diagnosis not present

## 2017-01-26 DIAGNOSIS — Z5112 Encounter for antineoplastic immunotherapy: Secondary | ICD-10-CM | POA: Diagnosis not present

## 2017-01-26 DIAGNOSIS — N189 Chronic kidney disease, unspecified: Secondary | ICD-10-CM

## 2017-01-26 LAB — CBC & DIFF AND RETIC
BASO%: 0.2 % (ref 0.0–2.0)
Basophils Absolute: 0 10*3/uL (ref 0.0–0.1)
EOS%: 0.7 % (ref 0.0–7.0)
Eosinophils Absolute: 0 10*3/uL (ref 0.0–0.5)
HCT: 34.2 % — ABNORMAL LOW (ref 38.4–49.9)
HEMOGLOBIN: 11.2 g/dL — AB (ref 13.0–17.1)
Immature Retic Fract: 8.7 % (ref 3.00–10.60)
LYMPH%: 6.8 % — ABNORMAL LOW (ref 14.0–49.0)
MCH: 30.7 pg (ref 27.2–33.4)
MCHC: 32.7 g/dL (ref 32.0–36.0)
MCV: 93.7 fL (ref 79.3–98.0)
MONO#: 0.2 10*3/uL (ref 0.1–0.9)
MONO%: 5.4 % (ref 0.0–14.0)
NEUT%: 86.9 % — ABNORMAL HIGH (ref 39.0–75.0)
NEUTROS ABS: 3.7 10*3/uL (ref 1.5–6.5)
Platelets: 111 10*3/uL — ABNORMAL LOW (ref 140–400)
RBC: 3.65 10*6/uL — ABNORMAL LOW (ref 4.20–5.82)
RDW: 16.1 % — AB (ref 11.0–14.6)
Retic Ct Abs: 13.14 10*3/uL — ABNORMAL LOW (ref 34.80–93.90)
WBC: 4.3 10*3/uL (ref 4.0–10.3)
lymph#: 0.3 10*3/uL — ABNORMAL LOW (ref 0.9–3.3)

## 2017-01-26 LAB — COMPREHENSIVE METABOLIC PANEL
ALBUMIN: 3.5 g/dL (ref 3.5–5.0)
ALK PHOS: 116 U/L (ref 40–150)
ALT: 23 U/L (ref 0–55)
AST: 29 U/L (ref 5–34)
Anion Gap: 10 mEq/L (ref 3–11)
BUN: 19.6 mg/dL (ref 7.0–26.0)
CALCIUM: 8.9 mg/dL (ref 8.4–10.4)
CHLORIDE: 109 meq/L (ref 98–109)
CO2: 24 mEq/L (ref 22–29)
Creatinine: 0.9 mg/dL (ref 0.7–1.3)
EGFR: 85 mL/min/{1.73_m2} — ABNORMAL LOW (ref >90)
GLUCOSE: 115 mg/dL (ref 70–140)
POTASSIUM: 4 meq/L (ref 3.5–5.1)
SODIUM: 143 meq/L (ref 136–145)
Total Bilirubin: 1.42 mg/dL — ABNORMAL HIGH (ref 0.20–1.20)
Total Protein: 5.4 g/dL — ABNORMAL LOW (ref 6.4–8.3)

## 2017-01-26 MED ORDER — SODIUM CHLORIDE 0.9 % IV SOLN
INTRAVENOUS | Status: DC
  Administered 2017-01-26: 14:00:00 via INTRAVENOUS

## 2017-01-26 MED ORDER — ONDANSETRON HCL 8 MG PO TABS
8.0000 mg | ORAL_TABLET | Freq: Once | ORAL | Status: AC
  Administered 2017-01-26: 8 mg via ORAL

## 2017-01-26 MED ORDER — ONDANSETRON HCL 8 MG PO TABS
ORAL_TABLET | ORAL | Status: AC
  Filled 2017-01-26: qty 1

## 2017-01-26 MED ORDER — BORTEZOMIB CHEMO SQ INJECTION 3.5 MG (2.5MG/ML)
2.5000 mg | Freq: Once | INTRAMUSCULAR | Status: AC
  Administered 2017-01-26: 2.5 mg via SUBCUTANEOUS
  Filled 2017-01-26: qty 2.5

## 2017-01-26 MED ORDER — OXYCODONE HCL ER 10 MG PO T12A: 10.0000 mg | EXTENDED_RELEASE_TABLET | Freq: Two times a day (BID) | ORAL | 0 refills | Status: DC

## 2017-01-26 MED ORDER — OXYCODONE HCL 5 MG PO TABS: 5.0000 mg | ORAL_TABLET | Freq: Four times a day (QID) | ORAL | 0 refills | Status: DC | PRN

## 2017-01-26 NOTE — Patient Instructions (Signed)
[facility] Discharge Instructions for Patients Receiving Chemotherapy  Today you received the following chemotherapy agents: Velcade.  To help prevent nausea and vomiting after your treatment, we encourage you to take your nausea medication: Zofran. Take one every 8 hours as needed.    If you develop nausea and vomiting that is not controlled by your nausea medication, call the clinic.   BELOW ARE SYMPTOMS THAT SHOULD BE REPORTED IMMEDIATELY:  *FEVER GREATER THAN 100.5 F  *CHILLS WITH OR WITHOUT FEVER  NAUSEA AND VOMITING THAT IS NOT CONTROLLED WITH YOUR NAUSEA MEDICATION  *UNUSUAL SHORTNESS OF BREATH  *UNUSUAL BRUISING OR BLEEDING  TENDERNESS IN MOUTH AND THROAT WITH OR WITHOUT PRESENCE OF ULCERS  *URINARY PROBLEMS  *BOWEL PROBLEMS  UNUSUAL RASH Items with * indicate a potential emergency and should be followed up as soon as possible.  Feel free to call the clinic should you have any questions or concerns. The clinic phone number is [phone].  Please show the CHEMO ALERT CARD at check-in to the Emergency Department and triage nurse.   

## 2017-01-26 NOTE — Patient Instructions (Signed)
Thank you for choosing [facility] to provide your oncology and hematology care.  To afford each patient quality time with our providers, please arrive 30 minutes before your scheduled appointment time.  If you arrive late for your appointment, you may be asked to reschedule.  We strive to give you quality time with our providers, and arriving late affects you and other patients whose appointments are after yours.  If you are a no show for multiple scheduled visits, you may be dismissed from the clinic at the providers discretion.   Again, thank you for choosing [facility], our hope is that these requests will decrease the amount of time that you wait before being seen by our physicians.  ______________________________________________________________________ Should you have questions after your visit to the  Cancer Center, please contact our office at [phone] between the hours of 8:30 and 4:30 p.m.    Voicemails left after 4:30p.m will not be returned until the following business day.   For prescription refill requests, please have your pharmacy contact us directly.  Please also try to allow 48 hours for prescription requests.   Please contact the scheduling department for questions regarding scheduling.  For scheduling of procedures such as PET scans, CT scans, MRI, Ultrasound, etc please contact central scheduling at [phone].   Resources For Cancer Patients and Caregivers:  American Cancer Society:  800-227-2345  Can help patients locate various types of support and financial assistance Cancer Care: 1-800-813-HOPE (4673) Provides financial assistance, online support groups, medication/co-pay assistance.   [place] DSS:  336-641-3447 Where to apply for food stamps, Medicaid, and utility assistance Medicare Rights Center: 800-333-4114 Helps people with Medicare understand their rights and benefits, navigate the Medicare system, and secure the  quality healthcare they deserve SCAT: 336-333-6589 [place] [employer]'s shared-ride transportation service for eligible riders who have a disability that prevents them from riding the fixed route bus.   For additional information on assistance programs please contact our social worker:   Grier Hock/Abigail Elmore:  [phone] 

## 2017-01-26 NOTE — Progress Notes (Signed)
Patient reports getting dizzy on standing after leaving Dr. Grier Mitts office. He quickly sat down and this resolved, but occurred again after standing up to walk back to infusion room. BP 108/59 at MD apt and patient reports not eating or drinking much today. He is given a full cup of water and asked to drink it while here for infusion apt today. Informed MD, who ordered 250 NS over 30 minutes to infuse during velcade apt today. BP 106/78 at completion, and patient able to stand and ambulate without dizziness. PIV removed and patient discharged home with spouse using walker.

## 2017-01-27 LAB — KAPPA/LAMBDA LIGHT CHAINS
IG LAMBDA FREE LIGHT CHAIN: 3.7 mg/L — AB (ref 5.7–26.3)
Ig Kappa Free Light Chain: 22.3 mg/L — ABNORMAL HIGH (ref 3.3–19.4)
Kappa/Lambda FluidC Ratio: 6.03 — ABNORMAL HIGH (ref 0.26–1.65)

## 2017-01-27 NOTE — Progress Notes (Signed)
Richard Kitchen    HEMATOLOGY/ONCOLOGY CLINIC NOTE  Date of Service: .01/26/2017  Patient Care Team: Sandi Mariscal, MD as PCP - General (Internal Medicine)  CHIEF COMPLAINTS/PURPOSE OF CONSULTATION:  f/u for Multiple Myeloma  HISTORY OF PRESENTING ILLNESS:  plz see previous note for details on HPI  INTERVAL HISTORY  Richard Vang is here for his scheduled followup for myeloma prior to C5D8  Velcade. Notes back pain is well controlled. Decubitus ulcer is healing well. Good appetite and improved energy levels. No SOB/CP No fevers/chills/night sweats. No overt bleeding.  Serum Kappa FLC down to 22.3 and continue to improve. Other labs stable.   MEDICAL HISTORY:  Past Medical History:  Diagnosis Date  . Arthritis    "left shoulder" (07/31/2016)  . Coronary artery disease   . GERD (gastroesophageal reflux disease)   . Heart murmur   . High cholesterol   . Hypertension   . Multiple myeloma ([place])   . Sleep apnea    "probably; having test in November" (07/31/2016)  . Type II diabetes mellitus ([place])     SURGICAL HISTORY: Past Surgical History:  Procedure Laterality Date  . CARDIAC CATHETERIZATION N/A 07/31/2016   Procedure: Left Heart Cath and Coronary Angiography;  Surgeon: Lorretta Harp, MD;  Location: [facility];  Service: Cardiovascular;  Laterality: N/A;  . CARDIAC CATHETERIZATION N/A 07/31/2016   Procedure: Coronary Stent Intervention;  Surgeon: Lorretta Harp, MD;  Location: [facility];  Service: Cardiovascular;  Laterality: N/A;  . CORONARY ANGIOPLASTY    . SHOULDER SURGERY Left 1973   "put pin in it where it had separated"   . TONSILLECTOMY  ~ 1956    SOCIAL HISTORY: Social History   Social History  . Marital status: Divorced    Spouse name: N/A  . Number of children: N/A  . Years of education: N/A   Occupational History  . Not on file.   Social History Main Topics  . Smoking status: Never Smoker  . Smokeless tobacco: Never Used  . Alcohol use Yes   Comment: 07/31/2016 "nothing since 2002"  . Drug use: No  . Sexual activity: Not Currently   Other Topics Concern  . Not on file   Social History Narrative  . No narrative on file    FAMILY HISTORY: Family History  Problem Relation Age of Onset  . Hypertension Other     ALLERGIES:  has No Known Allergies.  MEDICATIONS:  Current Outpatient Prescriptions  Medication Sig Dispense Refill  . acyclovir (ZOVIRAX) 400 MG tablet Take 1 tablet (400 mg total) by mouth 2 (two) times daily. 60 tablet 3  . atorvastatin (LIPITOR) 40 MG tablet Take 1 tablet (40 mg total) by mouth at bedtime. 30 tablet 12  . bisacodyl (DULCOLAX) 5 MG EC tablet Take 1 tablet (5 mg total) by mouth daily as needed for moderate constipation. 30 tablet 0  . carvedilol (COREG) 12.5 MG tablet Take 1 tablet (12.5 mg total) by mouth 2 (two) times daily with a meal. 60 tablet 2  . Cholecalciferol (VITAMIN D3) 5000 units CAPS Take 1 capsule by mouth daily.     . clopidogrel (PLAVIX) 75 MG tablet Take 1 tablet (75 mg total) by mouth daily with breakfast. 30 tablet 12  . Cyanocobalamin (B-12) 1000 MCG SUBL Place 1,000 mcg under the tongue daily. 30 each 3  . cyclophosphamide (CYTOXAN) 50 MG tablet Take 9 tablets (450 mg total) by mouth once a week. Days 1, 8, and 15 of each  cycle give on an empty stomach in AM. 27 tablet 1  . dexamethasone (DECADRON) 4 MG tablet Take 5 tablets (20 mg) on days 1, 8, and 15 of chemo. Repeat every 21 days. 30 tablet 3  . feeding supplement, ENSURE ENLIVE, (ENSURE ENLIVE) LIQD Take 237 mLs by mouth 2 (two) times daily between meals. 237 mL 12  . furosemide (LASIX) 40 MG tablet     . insulin aspart (NOVOLOG) 100 UNIT/ML injection Correction coverage: Sensitive (thin, NPO, renal)  CBG < 70: implement hypoglycemia protocol  CBG 70 - 120: 0 units  CBG 121 - 150: 1 unit  CBG 151 - 200: 2 units  CBG 201 - 250: 3 units  CBG 251 - 300: 5 units  CBG 301 - 350: 7 units  CBG 351 - 400 9 units  CBG >  400 call MD and obtain STAT lab verification 10 mL 11  . montelukast (SINGULAIR) 10 MG tablet Take 10 mg by mouth at bedtime.    Richard Kitchen morphine (MS CONTIN) 15 MG 12 hr tablet Take 1 tablet (15 mg total) by mouth every 12 (twelve) hours. 20 tablet 0  . nitroGLYCERIN (NITROSTAT) 0.3 MG SL tablet Place 0.3 mg under the tongue as needed for chest pain.    Richard Kitchen omeprazole (PRILOSEC) 40 MG capsule     . ondansetron (ZOFRAN) 8 MG tablet Take 1 tablet (8 mg total) by mouth 2 (two) times daily as needed for refractory nausea / vomiting. Starting on days 4 and 11 not on day 8. 30 tablet 1  . oxyCODONE (OXY IR/ROXICODONE) 5 MG immediate release tablet Take 1 tablet (5 mg total) by mouth every 6 (six) hours as needed for severe pain. 60 tablet 0  . oxyCODONE (OXYCONTIN) 10 mg 12 hr tablet Take 1 tablet (10 mg total) by mouth every 12 (twelve) hours. 60 tablet 0  . polyethylene glycol (MIRALAX / GLYCOLAX) packet Take 17 g by mouth daily. 14 each 0  . potassium chloride SA (K-DUR,KLOR-CON) 20 MEQ tablet Take 1 tablet (20 mEq total) by mouth daily.    . prochlorperazine (COMPAZINE) 10 MG tablet Take 1 tablet (10 mg total) by mouth every 6 (six) hours as needed (Nausea or vomiting). 30 tablet 1  . warfarin (COUMADIN) 6 MG tablet Take 4 mg by mouth every morning.      No current facility-administered medications for this visit.     REVIEW OF SYSTEMS:    10 Point review of Systems was done is negative except as noted above.  PHYSICAL EXAMINATION: ECOG PERFORMANCE STATUS: 3 - Symptomatic, >50% confined to bed  . Vitals:   01/26/17 1214  BP: (!) 108/59  Pulse: (!) 55  Resp: 18  Temp: 97.7 F (36.5 C)   Filed Weights   01/26/17 1214  Weight: 177 lb 6.4 oz (80.5 kg)   .Body mass index is 25.45 kg/m.  GENERAL: NAD SKIN: petechiae over upper extremities OROPHARYNX: MMM  NECK: supple,+ JVD, LYMPH: no palpable lymphadenopathy in the cervical, axillary or inguinal LUNGS: CTA b/l HEART: S1-S2  irreg ABDOMEN: abdomen soft, non-tender, normoactive bowel sounds, no hepatosplenomegaly palpable PSYCH: Alert and oriented 3  NEURO: non focal. LE : b/l trace pedal edema   LABORATORY DATA:  I have reviewed the data as listed . CBC Latest Ref Rng & Units 01/26/2017 01/19/2017 01/05/2017  WBC 4.0 - 10.3 10e3/uL 4.3 2.6(L) 4.4  Hemoglobin 13.0 - 17.1 g/dL 11.2(L) 10.6(L) 10.9(L)  Hematocrit 38.4 - 49.9 % 34.2(L) 32.9(L) 33.4(L)  Platelets 140 - 400 10e3/uL 111(L) 161 108 Large platelets present(L)   . CMP Latest Ref Rng & Units 01/26/2017 01/19/2017 01/19/2017  Glucose 70 - 140 mg/dl 115 138 -  BUN 7.0 - 26.0 mg/dL 19.6 16.3 -  Creatinine 0.7 - 1.3 mg/dL 0.9 0.9 -  Sodium 136 - 145 mEq/L 143 141 -  Potassium 3.5 - 5.1 mEq/L 4.0 4.1 -  Chloride 101 - 111 mmol/L - - -  CO2 22 - 29 mEq/L 24 21(L) -  Calcium 8.4 - 10.4 mg/dL 8.9 9.3 -  Total Protein 6.4 - 8.3 g/dL 5.4(L) 5.3(L) 5.1(L)  Total Bilirubin 0.20 - 1.20 mg/dL 1.42(H) 0.91 -  Alkaline Phos 40 - 150 U/L 116 131 -  AST 5 - 34 U/L 29 23 -  ALT 0 - 55 U/L 23 21 -      RADIOGRAPHIC STUDIES: I have personally reviewed the radiological images as listed and agreed with the findings in the report. No results found.  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  K/L 95, No M spike on SPEP -- suggests light chain MM Cytogenetics - normal male chromosomes FISH- +11 and 13q-/-13  Patient is s/p  1 cycle of Vd and Serum free kappa LC has decreased from 700 to 203.6 with improvement in his K/L ratio from 94.59 to 29. Completed 2nd cycle of treatment with Vd + Cytoxan (253m/m2) Completed 3rd cycle of treatment with Vd + Cytoxan (2074mm2) - cytoxan held D15 due to thrombocytopenia Completed 4th of VCd  Kappa free light chains have improved progressively from 700 down to around 26 with improivng K/L ratio.  2) severe hypercalcemia calcium a more than 15 now down to normal 9 This is likely due to MM resolved with bisphosphonates, forced saline diuresis and Steroids in the hospital  3) CAD s/p prox LAD DES PCI on 07/31/2016 with ischemic cardiomyopathy ejection fraction of 25-35%. Rpt ECHO on 10/03/2016 shows improvement in EF to 55-60% with no RWMABN.  4) Macrocytic Anemia with moderate thrombocytopenia - due to MM and B12 def hgb 11  5) Thrombocytopenia - due to MM and treatment -- plt stable at 111k today. No bleeding.  5) AKI on CKD -- related to hypercalcemia vs ?cardiorenal syndrome vs MM - improved. Creatinine is improved  To 0.8. Patient has diuresed well and most of the 3rd spaced fluid has resolved.  6) B12 deficiency - Received [place] B12 daily while in the hopsital -cont SL B12 100074mpo daily  PLAN --continue Velcade D1,4,8,11. C5 D8 today --continue Cytoxan/Velcade/Dex -continue on antiplatelet therapy  -on Acyclovir prophylaxis -continue q4weeks IV Pamidronate  -Transfuse when necessary to keep the hemoglobin close to/above 9given his significant cardiac comorbidities. -on Oxycontin and prn oxycodone for neoplasm related pain control. We discussed about starting to try to wean off his narcotics. His pain from multiple myeloma in the bones is improved but he has issues with chronic back pain due to degenerative disc disease. We discussed that we will try to do initially discontinue his long-acting  OxyContin. The plan is to try to get him off his OxyContin in the  morning and then at nighttime and use oxycodone when necessary. -He was also encouraged to follow-up with his primary care physician to develop a treatment plan for his chronic back pain.  6) b/l Lower extremity swelling likely due to CHF an CKD. Much improved. Plan -continued adjustment and monitoring of diuretic therapy as per PCP and cardiology -Potassium levels are normal today.  Richard Kitchen7) CAD s/p recent DES 8) Afib with RVR on coumadin -continue mx per PCP and cardiology  9) Decubitus Ulcers on buttocks -Stage 2 - improving/healing. -wound care RN mx this.   -continue treatment as per schedule -continue Pamidronate q4weeks -RTC with Dr Irene Limbo C6D4 with labs Continue labs D1 and D8 of treatment   All of the patients questions were answered with apparent satisfaction. The patient knows to call the clinic with any problems, questions or concerns.  I spent 20 minutes counseling the patient face to face. The total time spent in the appointment was 25 minutes and more than 50% was on counseling and direct patient cares.    Sullivan Lone MD [place] AAHIVMS [facility] Hematology/Oncology Physician [facility]  (Office):       [phone] (Work cell):  [phone] (Fax):           [phone]

## 2017-01-28 ENCOUNTER — Telehealth: Payer: Self-pay | Admitting: Hematology

## 2017-01-28 LAB — MULTIPLE MYELOMA PANEL, SERUM
Albumin SerPl Elph-Mcnc: 3.4 g/dL (ref 2.9–4.4)
Albumin/Glob SerPl: 2.1 — ABNORMAL HIGH (ref 0.7–1.7)
Alpha 1: 0.2 g/dL (ref 0.0–0.4)
Alpha2 Glob SerPl Elph-Mcnc: 0.5 g/dL (ref 0.4–1.0)
B-Globulin SerPl Elph-Mcnc: 0.7 g/dL (ref 0.7–1.3)
Gamma Glob SerPl Elph-Mcnc: 0.4 g/dL (ref 0.4–1.8)
Globulin, Total: 1.7 g/dL — ABNORMAL LOW (ref 2.2–3.9)
IgA, Qn, Serum: 18 mg/dL — ABNORMAL LOW (ref 61–437)
IgG, Qn, Serum: 496 mg/dL — ABNORMAL LOW (ref 700–1600)
IgM, Qn, Serum: 12 mg/dL — ABNORMAL LOW (ref 20–172)
Total Protein: 5.1 g/dL — ABNORMAL LOW (ref 6.0–8.5)

## 2017-01-28 NOTE — Telephone Encounter (Signed)
Wife picked up - aware of appts added per 4/9 los. - will pick up new schedule when they come in on 4/12

## 2017-01-29 ENCOUNTER — Ambulatory Visit (HOSPITAL_BASED_OUTPATIENT_CLINIC_OR_DEPARTMENT_OTHER): Payer: Medicare HMO

## 2017-01-29 ENCOUNTER — Telehealth: Payer: Self-pay | Admitting: Hematology

## 2017-01-29 VITALS — BP 125/75 | HR 75 | Temp 97.7°F | Resp 20

## 2017-01-29 DIAGNOSIS — C9000 Multiple myeloma not having achieved remission: Secondary | ICD-10-CM | POA: Diagnosis not present

## 2017-01-29 DIAGNOSIS — Z5112 Encounter for antineoplastic immunotherapy: Secondary | ICD-10-CM | POA: Diagnosis not present

## 2017-01-29 MED ORDER — ONDANSETRON HCL 8 MG PO TABS
8.0000 mg | ORAL_TABLET | Freq: Once | ORAL | Status: AC
  Administered 2017-01-29: 8 mg via ORAL

## 2017-01-29 MED ORDER — SODIUM CHLORIDE 0.9 % IV SOLN
500.0000 mL | INTRAVENOUS | Status: DC
  Administered 2017-01-29: 500 mL via INTRAVENOUS

## 2017-01-29 MED ORDER — BORTEZOMIB CHEMO SQ INJECTION 3.5 MG (2.5MG/ML)
2.5000 mg | Freq: Once | INTRAMUSCULAR | Status: AC
  Administered 2017-01-29: 2.5 mg via SUBCUTANEOUS
  Filled 2017-01-29: qty 2.5

## 2017-01-29 MED ORDER — ONDANSETRON HCL 8 MG PO TABS
ORAL_TABLET | ORAL | Status: AC
  Filled 2017-01-29: qty 1

## 2017-01-29 NOTE — Progress Notes (Signed)
BP 90s/60s today.  Dr. Irene Limbo informed.  Pt also received extra fluid with treatment on Monday.  Pt states he has been having "a lot of diarrhea at home."  Per Dr. Irene Limbo, give 1/2L NS, instruct pt to avoid stool softeners/laxatives at home.  Pt also instructed to hold cytoxan on Monday if diarrhea persists.  Pt verbalized understanding.  Dr. Irene Limbo stated to send stool for C-diff if pt has loose stools while at cancer center.  Pt stated he is not currently having diarrhea.    Teaching done with pt regarding low BP, & increasing fluid intake. Instructed pt to notify clinic if consistently dizzy/lightheaded at home.   Instructed pt to look into getting BP machine at home and holding BP medication if BP is lower than 150'V systolic.  Pt and wife verbalized understanding.

## 2017-01-29 NOTE — Telephone Encounter (Signed)
R/s appt per sch message from [place] to reach patient left message with appt date and time.

## 2017-01-29 NOTE — Patient Instructions (Signed)
[facility] Discharge Instructions for Patients Receiving Chemotherapy  Today you received the following chemotherapy agents velcade   To help prevent nausea and vomiting after your treatment, we encourage you to take your nausea medication as directed.    If you develop nausea and vomiting that is not controlled by your nausea medication, call the clinic.   BELOW ARE SYMPTOMS THAT SHOULD BE REPORTED IMMEDIATELY:  *FEVER GREATER THAN 100.5 F  *CHILLS WITH OR WITHOUT FEVER  NAUSEA AND VOMITING THAT IS NOT CONTROLLED WITH YOUR NAUSEA MEDICATION  *UNUSUAL SHORTNESS OF BREATH  *UNUSUAL BRUISING OR BLEEDING  TENDERNESS IN MOUTH AND THROAT WITH OR WITHOUT PRESENCE OF ULCERS  *URINARY PROBLEMS  *BOWEL PROBLEMS  UNUSUAL RASH Items with * indicate a potential emergency and should be followed up as soon as possible.  Feel free to call the clinic you have any questions or concerns. The clinic phone number is [phone].   Dehydration, Adult Dehydration is a condition in which there is not enough fluid or water in the body. This happens when you lose more fluids than you take in. Important organs, such as the kidneys, brain, and heart, cannot function without a proper amount of fluids. Any loss of fluids from the body can lead to dehydration. Dehydration can range from mild to severe. This condition should be treated right away to prevent it from becoming severe. What are the causes? This condition may be caused by:  Vomiting.  Diarrhea.  Excessive sweating, such as from heat exposure or exercise.  Not drinking enough fluid, especially:  When ill.  While doing activity that requires a lot of energy.  Excessive urination.  Fever.  Infection.  Certain medicines, such as medicines that cause the body to lose excess fluid (diuretics).  Inability to access safe drinking water.  Reduced physical ability to get adequate water and food. What increases the  risk? This condition is more likely to develop in people:  Who have a poorly controlled long-term (chronic) illness, such as diabetes, heart disease, or kidney disease.  Who are age 19 or older.  Who are disabled.  Who live in a place with high altitude.  Who play endurance sports. What are the signs or symptoms? Symptoms of mild dehydration may include:   Thirst.  Dry lips.  Slightly dry mouth.  Dry, warm skin.  Dizziness. Symptoms of moderate dehydration may include:   Very dry mouth.  Muscle cramps.  Dark urine. Urine may be the color of tea.  Decreased urine production.  Decreased tear production.  Heartbeat that is irregular or faster than normal (palpitations).  Headache.  Light-headedness, especially when you stand up from a sitting position.  Fainting (syncope). Symptoms of severe dehydration may include:   Changes in skin, such as:  Cold and clammy skin.  Blotchy (mottled) or pale skin.  Skin that does not quickly return to normal after being lightly pinched and released (poor skin turgor).  Changes in body fluids, such as:  Extreme thirst.  No tear production.  Inability to sweat when body temperature is high, such as in hot weather.  Very little urine production.  Changes in vital signs, such as:  Weak pulse.  Pulse that is more than 100 beats a minute when sitting still.  Rapid breathing.  Low blood pressure.  Other changes, such as:  Sunken eyes.  Cold hands and feet.  Confusion.  Lack of energy (lethargy).  Difficulty waking up from sleep.  Short-term weight loss.  Unconsciousness. How is this diagnosed? This condition is diagnosed based on your symptoms and a physical exam. Blood and urine tests may be done to help confirm the diagnosis. How is this treated? Treatment for this condition depends on the severity. Mild or moderate dehydration can often be treated at home. Treatment should be started right away. Do  not wait until dehydration becomes severe. Severe dehydration is an emergency and it needs to be treated in a hospital. Treatment for mild dehydration may include:   Drinking more fluids.  Replacing salts and minerals in your blood (electrolytes) that you may have lost. Treatment for moderate dehydration may include:   Drinking an oral rehydration solution (ORS). This is a drink that helps you replace fluids and electrolytes (rehydrate). It can be found at pharmacies and retail stores. Treatment for severe dehydration may include:   Receiving fluids through an IV tube.  Receiving an electrolyte solution through a feeding tube that is passed through your nose and into your stomach (nasogastric tube, or NG tube).  Correcting any abnormalities in electrolytes.  Treating the underlying cause of dehydration. Follow these instructions at home:  If directed by your health care provider, drink an ORS:  Make an ORS by following instructions on the package.  Start by drinking small amounts, about  cup (120 mL) every 5-10 minutes.  Slowly increase how much you drink until you have taken the amount recommended by your health care provider.  Drink enough clear fluid to keep your urine clear or pale yellow. If you were told to drink an ORS, finish the ORS first, then start slowly drinking other clear fluids. Drink fluids such as:  Water. Do not drink only water. Doing that can lead to having too little salt (sodium) in the body (hyponatremia).  Ice chips.  Fruit juice that you have added water to (diluted fruit juice).  Low-calorie sports drinks.  Avoid:  Alcohol.  Drinks that contain a lot of sugar. These include high-calorie sports drinks, fruit juice that is not diluted, and soda.  Caffeine.  Foods that are greasy or contain a lot of fat or sugar.  Take over-the-counter and prescription medicines only as told by your health care provider.  Do not take sodium tablets. This can  lead to having too much sodium in the body (hypernatremia).  Eat foods that contain a healthy balance of electrolytes, such as bananas, oranges, potatoes, tomatoes, and spinach.  Keep all follow-up visits as told by your health care provider. This is important. Contact a health care provider if:  You have abdominal pain that:  Gets worse.  Stays in one area (localizes).  You have a rash.  You have a stiff neck.  You are more irritable than usual.  You are sleepier or more difficult to wake up than usual.  You feel weak or dizzy.  You feel very thirsty.  You have urinated only a small amount of very dark urine over 6-8 hours. Get help right away if:  You have symptoms of severe dehydration.  You cannot drink fluids without vomiting.  Your symptoms get worse with treatment.  You have a fever.  You have a severe headache.  You have vomiting or diarrhea that:  Gets worse.  Does not go away.  You have blood or green matter (bile) in your vomit.  You have blood in your stool. This may cause stool to look black and tarry.  You have not urinated in 6-8 hours.  You faint.  Your  heart rate while sitting still is over 100 beats a minute.  You have trouble breathing. This information is not intended to replace advice given to you by your health care provider. Make sure you discuss any questions you have with your health care provider. Document Released: 10/06/2005 Document Revised: 05/02/2016 Document Reviewed: 11/30/2015 Elsevier Interactive Patient Education  2017 [employer].

## 2017-02-06 ENCOUNTER — Encounter: Payer: Self-pay | Admitting: Hematology

## 2017-02-06 NOTE — Progress Notes (Signed)
Received PA request for Oxycontin. Submitted via Cover My Meds.  Decision will be made in 24-72 hours.   [name] Key: [id number] - PA Case ID: [id number] Need help? Call us at [phone]  Outcome  Pendingtoday  Questionnaire submitted. PA Case [id number] Status: Pending review.

## 2017-02-07 ENCOUNTER — Other Ambulatory Visit: Payer: Self-pay | Admitting: Hematology

## 2017-02-07 DIAGNOSIS — C9000 Multiple myeloma not having achieved remission: Secondary | ICD-10-CM

## 2017-02-09 ENCOUNTER — Other Ambulatory Visit: Payer: Self-pay | Admitting: *Deleted

## 2017-02-09 ENCOUNTER — Other Ambulatory Visit (HOSPITAL_BASED_OUTPATIENT_CLINIC_OR_DEPARTMENT_OTHER): Payer: Medicare HMO

## 2017-02-09 ENCOUNTER — Ambulatory Visit (HOSPITAL_BASED_OUTPATIENT_CLINIC_OR_DEPARTMENT_OTHER): Payer: Medicare HMO

## 2017-02-09 ENCOUNTER — Encounter: Payer: Self-pay | Admitting: Hematology

## 2017-02-09 VITALS — BP 109/65 | HR 71 | Temp 98.0°F | Resp 18

## 2017-02-09 DIAGNOSIS — Z5112 Encounter for antineoplastic immunotherapy: Secondary | ICD-10-CM | POA: Diagnosis not present

## 2017-02-09 DIAGNOSIS — C9000 Multiple myeloma not having achieved remission: Secondary | ICD-10-CM

## 2017-02-09 LAB — COMPREHENSIVE METABOLIC PANEL
ALBUMIN: 3.4 g/dL — AB (ref 3.5–5.0)
ALT: 22 U/L (ref 0–55)
AST: 24 U/L (ref 5–34)
Alkaline Phosphatase: 97 U/L (ref 40–150)
Anion Gap: 8 mEq/L (ref 3–11)
BUN: 20.3 mg/dL (ref 7.0–26.0)
CALCIUM: 8.9 mg/dL (ref 8.4–10.4)
CO2: 24 mEq/L (ref 22–29)
Chloride: 111 mEq/L — ABNORMAL HIGH (ref 98–109)
Creatinine: 0.9 mg/dL (ref 0.7–1.3)
EGFR: 84 mL/min/{1.73_m2} — AB (ref >90)
Glucose: 104 mg/dl (ref 70–140)
POTASSIUM: 4.3 meq/L (ref 3.5–5.1)
Sodium: 143 mEq/L (ref 136–145)
Total Bilirubin: 1.16 mg/dL (ref 0.20–1.20)
Total Protein: 5.2 g/dL — ABNORMAL LOW (ref 6.4–8.3)

## 2017-02-09 LAB — CBC & DIFF AND RETIC
BASO%: 1.3 % (ref 0.0–2.0)
Basophils Absolute: 0.1 10*3/uL (ref 0.0–0.1)
EOS ABS: 0.1 10*3/uL (ref 0.0–0.5)
EOS%: 2.9 % (ref 0.0–7.0)
HCT: 33.2 % — ABNORMAL LOW (ref 38.4–49.9)
HEMOGLOBIN: 10.7 g/dL — AB (ref 13.0–17.1)
IMMATURE RETIC FRACT: 5.8 % (ref 3.00–10.60)
LYMPH%: 10 % — ABNORMAL LOW (ref 14.0–49.0)
MCH: 30.7 pg (ref 27.2–33.4)
MCHC: 32.2 g/dL (ref 32.0–36.0)
MCV: 95.1 fL (ref 79.3–98.0)
MONO#: 0.2 10*3/uL (ref 0.1–0.9)
MONO%: 5 % (ref 0.0–14.0)
NEUT%: 80.8 % — ABNORMAL HIGH (ref 39.0–75.0)
NEUTROS ABS: 3.1 10*3/uL (ref 1.5–6.5)
Platelets: 154 10*3/uL (ref 140–400)
RBC: 3.49 10*6/uL — AB (ref 4.20–5.82)
RDW: 16.6 % — AB (ref 11.0–14.6)
Retic %: 1.41 % (ref 0.80–1.80)
Retic Ct Abs: 49.21 10*3/uL (ref 34.80–93.90)
WBC: 3.8 10*3/uL — AB (ref 4.0–10.3)
lymph#: 0.4 10*3/uL — ABNORMAL LOW (ref 0.9–3.3)

## 2017-02-09 MED ORDER — PROCHLORPERAZINE MALEATE 10 MG PO TABS: 10.0000 mg | ORAL_TABLET | Freq: Four times a day (QID) | ORAL | 1 refills | Status: DC | PRN

## 2017-02-09 MED ORDER — OXYCODONE HCL 5 MG PO TABS: 10.0000 mg | ORAL_TABLET | ORAL | 0 refills | Status: DC | PRN

## 2017-02-09 MED ORDER — OXYCODONE-ACETAMINOPHEN 5-325 MG PO TABS
1.0000 | ORAL_TABLET | Freq: Once | ORAL | Status: AC
Start: 2017-02-09 — End: 2017-02-09
  Administered 2017-02-09: 1 via ORAL

## 2017-02-09 MED ORDER — ONDANSETRON HCL 8 MG PO TABS
8.0000 mg | ORAL_TABLET | Freq: Once | ORAL | Status: AC
Start: 2017-02-09 — End: 2017-02-09
  Administered 2017-02-09: 8 mg via ORAL

## 2017-02-09 MED ORDER — OXYCODONE-ACETAMINOPHEN 5-325 MG PO TABS
ORAL_TABLET | ORAL | Status: AC
  Filled 2017-02-09: qty 1

## 2017-02-09 MED ORDER — SODIUM CHLORIDE 0.9 % IV SOLN
Freq: Once | INTRAVENOUS | Status: AC
  Administered 2017-02-09: 13:00:00 via INTRAVENOUS

## 2017-02-09 MED ORDER — BORTEZOMIB CHEMO SQ INJECTION 3.5 MG (2.5MG/ML): 1.0000 mg/m2 | Freq: Once | INTRAMUSCULAR | Status: DC

## 2017-02-09 MED ORDER — ONDANSETRON HCL 8 MG PO TABS
ORAL_TABLET | ORAL | Status: AC
  Filled 2017-02-09: qty 1

## 2017-02-09 MED ORDER — PAMIDRONATE DISODIUM 30 MG/10ML IV SOLN
60.0000 mg | Freq: Once | INTRAVENOUS | Status: AC
Start: 2017-02-09 — End: 2017-02-09
  Administered 2017-02-09: 60 mg via INTRAVENOUS
  Filled 2017-02-09: qty 20

## 2017-02-09 MED ORDER — BORTEZOMIB CHEMO SQ INJECTION 3.5 MG (2.5MG/ML)
1.0000 mg/m2 | Freq: Once | INTRAMUSCULAR | Status: AC
Start: 2017-02-09 — End: 2017-02-09
  Administered 2017-02-09: 2 mg via SUBCUTANEOUS
  Filled 2017-02-09: qty 2

## 2017-02-09 NOTE — Progress Notes (Signed)
Received denial for Oxycontin from [facility]. Gave to [employer]

## 2017-02-09 NOTE — Progress Notes (Signed)
Patient reports that he did not take his Cytoxan last week due to diarrhea per MD, states he is unsure if he should take it this week or not.  Reports 2 episodes of diarrhea yesterday, none today.  Denies blood in stool.  Pt also reports pain is not well-controlled and he is having trouble getting his pain rx d/t insurance issues.  Reports numbness and feeling cold from toes to midfoot, states it has gotten somewhat worse over the last week.  Dr. Irene Limbo notified of these findings.  Okay to proceed with treatment today.  Per MD patient is to hold cytoxan again this week, pt verbalized understanding.  Darden Dates, RN, Dr. Grier Mitts nurse will work on refilling patient's compazine and pain medication rx.

## 2017-02-09 NOTE — Patient Instructions (Addendum)
[facility] Discharge Instructions for Patients Receiving Chemotherapy  Today you received the following chemotherapy agents: Velcade  To help prevent nausea and vomiting after your treatment, we encourage you to take your nausea medication as directed.    If you develop nausea and vomiting that is not controlled by your nausea medication, call the clinic.   BELOW ARE SYMPTOMS THAT SHOULD BE REPORTED IMMEDIATELY:  *FEVER GREATER THAN 100.5 F  *CHILLS WITH OR WITHOUT FEVER  NAUSEA AND VOMITING THAT IS NOT CONTROLLED WITH YOUR NAUSEA MEDICATION  *UNUSUAL SHORTNESS OF BREATH  *UNUSUAL BRUISING OR BLEEDING  TENDERNESS IN MOUTH AND THROAT WITH OR WITHOUT PRESENCE OF ULCERS  *URINARY PROBLEMS  *BOWEL PROBLEMS  UNUSUAL RASH Items with * indicate a potential emergency and should be followed up as soon as possible.  Feel free to call the clinic you have any questions or concerns. The clinic phone number is [phone].  Please show the CHEMO ALERT CARD at check-in to the Emergency Department and triage nurse.   Pamidronate injection What is this medicine? PAMIDRONATE (pa mi DROE nate) slows calcium loss from bones. It is used to treat high calcium blood levels from cancer or Paget's disease. It is also used to treat bone pain and prevent fractures from certain cancers that have spread to the bone. This medicine may be used for other purposes; ask your health care provider or pharmacist if you have questions. COMMON BRAND NAME(S): Aredia What should I tell my health care provider before I take this medicine? They need to know if you have any of these conditions: -aspirin-sensitive asthma -dental disease -kidney disease -an unusual or allergic reaction to pamidronate, other medicines, foods, dyes, or preservatives -pregnant or trying to get pregnant -breast-feeding How should I use this medicine? This medicine is for infusion into a vein. It is given by a health  care professional in a hospital or clinic setting. Talk to your pediatrician regarding the use of this medicine in children. This medicine is not approved for use in children. Overdosage: If you think you have taken too much of this medicine contact a poison control center or emergency room at once. NOTE: This medicine is only for you. Do not share this medicine with others. What if I miss a dose? This does not apply. What may interact with this medicine? -certain antibiotics given by injection -medicines for inflammation or pain like ibuprofen, naproxen -some diuretics like bumetanide, furosemide -cyclosporine -parathyroid hormone -tacrolimus -teriparatide -thalidomide This list may not describe all possible interactions. Give your health care provider a list of all the medicines, herbs, non-prescription drugs, or dietary supplements you use. Also tell them if you smoke, drink alcohol, or use illegal drugs. Some items may interact with your medicine. What should I watch for while using this medicine? Visit your doctor or health care professional for regular checkups. It may be some time before you see the benefit from this medicine. Do not stop taking your medicine unless your doctor tells you to. Your doctor may order blood tests or other tests to see how you are doing. Women should inform their doctor if they wish to become pregnant or think they might be pregnant. There is a potential for serious side effects to an unborn child. Talk to your health care professional or pharmacist for more information. You should make sure that you get enough calcium and vitamin D while you are taking this medicine. Discuss the foods you eat and the vitamins you take   with your health care professional. Some people who take this medicine have severe bone, joint, and/or muscle pain. This medicine may also increase your risk for a broken thigh bone. Tell your doctor right away if you have pain in your upper leg  or groin. Tell your doctor if you have any pain that does not go away or that gets worse. What side effects may I notice from receiving this medicine? Side effects that you should report to your doctor or health care professional as soon as possible: -allergic reactions like skin rash, itching or hives, swelling of the face, lips, or tongue -black or tarry stools -changes in vision -eye inflammation, pain -high blood pressure -jaw pain, especially burning or cramping -muscle weakness -numb, tingling pain -swelling of feet or hands -trouble passing urine or change in the amount of urine -unable to move easily Side effects that usually do not require medical attention (report to your doctor or health care professional if they continue or are bothersome): -bone, joint, or muscle pain -constipation -dizzy, drowsy -fever -headache -loss of appetite -nausea, vomiting -pain at site where injected This list may not describe all possible side effects. Call your doctor for medical advice about side effects. You may report side effects to FDA at 1-800-FDA-1088. Where should I keep my medicine? This drug is given in a hospital or clinic and will not be stored at home. NOTE: This sheet is a summary. It may not cover all possible information. If you have questions about this medicine, talk to your doctor, pharmacist, or health care provider.  2018 Elsevier/Gold Standard (2011-04-04 08:49:49)  

## 2017-02-10 LAB — KAPPA/LAMBDA LIGHT CHAINS
Ig Kappa Free Light Chain: 26.4 mg/L — ABNORMAL HIGH (ref 3.3–19.4)
Ig Lambda Free Light Chain: 6.4 mg/L (ref 5.7–26.3)
Kappa/Lambda FluidC Ratio: 4.13 — ABNORMAL HIGH (ref 0.26–1.65)

## 2017-02-12 ENCOUNTER — Ambulatory Visit (HOSPITAL_BASED_OUTPATIENT_CLINIC_OR_DEPARTMENT_OTHER): Payer: Medicare HMO | Admitting: Hematology

## 2017-02-12 ENCOUNTER — Telehealth: Payer: Self-pay | Admitting: Hematology

## 2017-02-12 ENCOUNTER — Encounter: Payer: Self-pay | Admitting: Hematology

## 2017-02-12 ENCOUNTER — Ambulatory Visit: Payer: Medicare HMO

## 2017-02-12 VITALS — BP 96/56 | HR 67 | Temp 98.0°F | Resp 19 | Ht 70.0 in | Wt 174.4 lb

## 2017-02-12 DIAGNOSIS — E119 Type 2 diabetes mellitus without complications: Secondary | ICD-10-CM

## 2017-02-12 DIAGNOSIS — C9000 Multiple myeloma not having achieved remission: Secondary | ICD-10-CM | POA: Diagnosis not present

## 2017-02-12 DIAGNOSIS — I10 Essential (primary) hypertension: Secondary | ICD-10-CM

## 2017-02-12 DIAGNOSIS — E538 Deficiency of other specified B group vitamins: Secondary | ICD-10-CM

## 2017-02-12 MED ORDER — LENALIDOMIDE 10 MG PO CAPS: ORAL_CAPSULE | ORAL | 2 refills | Status: DC

## 2017-02-12 NOTE — Patient Instructions (Addendum)
Thank you for choosing [facility] to provide your oncology and hematology care.  To afford each patient quality time with our providers, please arrive 30 minutes before your scheduled appointment time.  If you arrive late for your appointment, you may be asked to reschedule.  We strive to give you quality time with our providers, and arriving late affects you and other patients whose appointments are after yours.   If you are a no show for multiple scheduled visits, you may be dismissed from the clinic at the providers discretion.    Again, thank you for choosing [facility], our hope is that these requests will decrease the amount of time that you wait before being seen by our physicians.  ______________________________________________________________________  Should you have questions after your visit to the [facility], please contact our office at [phone] between the hours of 8:30 and 4:30 p.m.    Voicemails left after 4:30p.m will not be returned until the following business day.    For prescription refill requests, please have your pharmacy contact us directly.  Please also try to allow 48 hours for prescription requests.    Please contact the scheduling department for questions regarding scheduling.  For scheduling of procedures such as PET scans, CT scans, MRI, Ultrasound, etc please contact central scheduling at [phone].    Resources For Cancer Patients and Caregivers:   American Cancer Society:  405-653-9569  Can help patients locate various types of support and financial assistance  Cancer Care: 1-800-813-HOPE (612)543-1230) Provides financial assistance, online support groups, medication/co-pay assistance.    [address]:  579-695-4785 Where to apply for food stamps, Medicaid, and utility assistance  Medicare Rights Center: (856)206-1357 Helps people with Medicare understand their rights and benefits, navigate the Medicare  system, and secure the quality healthcare they deserve  [employer]: [place] [employer]'s shared-ride transportation service for eligible riders who have a disability that prevents them from riding the fixed route bus.    For additional information on assistance programs please contact our social worker:   [place]:  [phone]     Lenalidomide: Patient drug information   Brand Names: [name]  Revlimid Brand Names: [place]  Revlimid Warning  . Do not take this drug if you are pregnant. Use during pregnancy may cause birth defects or loss of the unborn baby. If you get pregnant while taking this drug or within 4 weeks after care ends, call your doctor right away.  . You must have 2 pregnancy tests that show you are NOT pregnant before starting this drug. You must have pregnancy tests done while taking this drug. Talk with your doctor.  . Use 2 kinds of birth control that you can trust for at least 4 weeks before care begins, during care, during any breaks in care, and for at least 4 weeks after care ends.  . This drug may stop your bone marrow from making some of the cells that your body needs. You will be closely watched by your doctor. Tell your doctor right away about any fever, sore throat, signs of infection, bleeding, shortness of breath, or feeling tired.  . Have blood work checked as you have been told by the doctor. Talk with the doctor.  . The chance of blood clots may be raised with this drug in certain patients. This may be blood clots in the arms, legs, or lungs; heart attack; or stroke. Your doctor may have you take a blood  thinner to prevent blood clots. Follow what your doctor has told you to do. Call your doctor right away if you have any chest pain or pressure; coughing up blood; shortness of breath; or pain, warmth, or swelling of the legs or arms. Call your doctor right away if you have signs of heart attack like chest pain that may spread to the  arms, neck, jaw, back, or stomach; sweating that is not normal; or feeling sick or throwing up. Call your doctor right away if you have signs of stroke like change in strength on 1 side is greater than the other; eyesight, speech, or balance problems; change in thinking clearly and with logic; or very bad headache.  . You may only get this drug through a special program. Talk with your doctor. What is this drug used for?  . It is used to treat multiple myeloma.  . It is used to treat a type of lymphoma.  . It is used to treat a health problem called myelodysplastic syndrome (MDS).  . It may be given to you for other reasons. Talk with the doctor. What do I need to tell my doctor BEFORE I take this drug?  . If you have an allergy to this drug or any part of this drug.  . If you are allergic to any drugs like this one, any other drugs, foods, or other substances. Tell your doctor about the allergy and what signs you had, like rash; hives; itching; shortness of breath; wheezing; cough; swelling of face, lips, tongue, or throat; or any other signs.  . If you are lactose intolerant.  . If you have a type of leukemia called chronic lymphocytic leukemia (CLL). In people with CLL, more deaths happened with this drug than with a certain other drug. Very bad heart problems also happened more often.  . If you are taking pembrolizumab.  . If you are breast-feeding. Do not breast-feed while you take this drug.  This is not a list of all drugs or health problems that interact with this drug.  Tell your doctor and pharmacist about all of your drugs (prescription or OTC, natural products, vitamins) and health problems. You must check to make sure that it is safe for you to take this drug with all of your drugs and health problems. Do not start, stop, or change the dose of any drug without checking with your doctor. What are some things I need to know or do while I take this drug?  [name] This drug may add to the chance  of getting some types of cancer. Talk with the doctor.  . If you have high blood sugar (diabetes), you will need to watch your blood sugar closely. Tell your doctor if you get signs of high blood sugar like confusion, feeling sleepy, more thirst, more hungry, passing urine more often, flushing, fast breathing, or breath that smells like fruit.  . You may have more chance of getting an infection. Wash hands often. Stay away from people with infections, colds, or flu.  . You may bleed more easily. Be careful and avoid injury. Use a soft toothbrush and an Copy.  . Patients with cancer who take this drug may be at a greater risk of getting a bad health problem called tumor lysis syndrome (TLS). Sometimes, this has been deadly. Call your doctor right away if you have a fast heartbeat or a heartbeat that does not feel normal; any passing out; trouble passing urine; muscle weakness or  cramps; upset stomach, throwing up, loose stools, or not able to eat; or feel sluggish.  . A very bad skin reaction (Stevens-Johnson syndrome/toxic epidermal necrolysis) may happen. It can cause very bad health problems that may not go away, and sometimes death. Get medical help right away if you have signs like red, swollen, blistered, or peeling skin (with or without fever); red or irritated eyes; or sores in your mouth, throat, nose, or eyes.  . Very bad and sometimes deadly liver problems have happened with this drug. Call your doctor right away if you have signs of liver problems like dark urine, feeling tired, not hungry, upset stomach or stomach pain, light-colored stools, throwing up, or yellow skin or eyes.  . A very bad and sometimes deadly reaction has happened with this drug. Most of the time, this reaction has signs like fever, rash, or swollen glands with problems in body organs like the liver, kidney, blood, heart, muscles and joints, or lungs. Talk with the doctor.  . In people with mantle cell lymphoma,  there may be a risk of early death with this drug. For more information, talk with your doctor.  . If you are 29 or older, use this drug with care. You could have more side effects.  . If you are a man and have sex with a pregnant male or a male who can get pregnant, always use a latex or synthetic condom during sex. Do this even if you have had a vasectomy. Use a latex or synthetic condom during care, during any breaks in care, and for at least 4 weeks after care ends.  . If you are a man and have unprotected sex with a male who is or could get pregnant, or your male partner gets pregnant within 4 weeks of your last dose, call your doctor right away.  . If you have sex without using 2 kinds of birth control that you can trust, if you think you may be pregnant, or if you miss your period, call your doctor right away. What are some side effects that I need to call my doctor about right away?  WARNING/CAUTION: Even though it may be rare, some people may have very bad and sometimes deadly side effects when taking a drug. Tell your doctor or get medical help right away if you have any of the following signs or symptoms that may be related to a very bad side effect:  . Signs of an allergic reaction, like rash; hives; itching; red, swollen, blistered, or peeling skin with or without fever; wheezing; tightness in the chest or throat; trouble breathing, swallowing, or talking; unusual hoarseness; or swelling of the mouth, face, lips, tongue, or throat.  . Signs of infection like fever, chills, very bad sore throat, ear or sinus pain, cough, more sputum or change in color of sputum, pain with passing urine, mouth sores, or wound that will not heal.  . Signs of bleeding like throwing up blood or throw up that looks like coffee grounds; coughing up blood; blood in the urine; black, red, or tarry stools; bleeding from the gums; vaginal bleeding that is not normal; bruises without a reason or that get bigger; or  any bleeding that is very bad or that you cannot stop.  . Signs of thyroid problems like a change in weight without trying, feeling nervous and excitable, feeling restless, feeling very weak, hair thinning, low mood (depression), neck swelling, not able to focus, not able to handle heat or cold,  period (menstrual) changes, shakiness, or sweating.  . Signs of electrolyte problems like mood changes, confusion, muscle pain or weakness, a heartbeat that does not feel normal, seizures, not hungry, or very bad upset stomach or throwing up.  . A burning, numbness, or tingling feeling that is not normal.  . Swelling.  . Low mood (depression).  . Very bad headache.  . Very bad dizziness or passing out.  . Feeling very tired or weak.  [name] Swollen gland.  . Change in eyesight. What are some other side effects of this drug?  All drugs may cause side effects. However, many people have no side effects or only have minor side effects. Call your doctor or get medical help if any of these side effects or any other side effects bother you or do not go away:  . Back pain.  . Bone pain.  . Loose stools (diarrhea).  . Headache.  . Feeling tired or weak.  . Dizziness.  . Dry skin.  . Sweating a lot.  . Nose and throat irritation.  . Change in taste.  . Not able to sleep.  . Hard stools (constipation).  [name] Upset stomach or throwing up.  . Belly pain.  . Dry mouth.  . Shakiness.  . Not hungry.  . Weight loss.  . Joint pain.  These are not all of the side effects that may occur. If you have questions about side effects, call your doctor. Call your doctor for medical advice about side effects.  You may report side effects to your national health agency. How is this drug best taken?  Use this drug as ordered by your doctor. Read all information given to you. Follow all instructions closely.  . Take this drug at the same time of day.  . To gain the most benefit, do not miss doses.  [name] Keep taking this drug as  you have been told by your doctor or other health care provider, even if you feel well.  . Take with or without food.  . Take with a full glass of water.  . Swallow whole. Do not chew, break, or crush.  . Do not open the capsules.  . Pregnant women or females of childbearing age must not touch the capsules. Talk with the doctor.  . If you touch a broken capsule, or the drug inside the capsule, wash the area with soap and water.  . If a broken capsule or the drug inside the capsule touches your eyes, rinse your eyes right away with water.  . Tell all of your health care providers that you take this drug. This includes your doctors, nurses, pharmacists, and dentists.  . Do not donate blood while using this drug and for 1 month after stopping.  . If you are a man, do not donate sperm while using this drug and for 1 month after stopping.  . Smoking may raise the chance of blood clots while taking this drug. Tell your doctor if you smoke.  . If you have upset stomach, throwing up, loose stools (diarrhea), or are not hungry, talk with your doctor. There may be ways to lower these side effects.  . This drug may affect how much of some other drugs are in your body. If you are taking other drugs, talk with your doctor. You may need to have your blood work checked more closely while taking this drug with your other drugs.  . Talk with your doctor before getting any vaccines. Use with  this drug may either raise the chance of an infection or make the vaccine not work as well. What do I do if I miss a dose?  [name] Take a missed dose as soon as you think about it and go back to your normal time.  . If it has been 12 hours or more since the missed dose, skip the missed dose and go back to your normal time.  . Do not take 2 doses at the same time or extra doses. How do I store and/or throw out this drug?  . Store at room temperature.  . Store in a dry place. Do not store in a bathroom.  [name] Keep all drugs in a safe  place. Keep all drugs out of the reach of children and pets.  . Throw away unused or expired drugs. Do not flush down a toilet or pour down a drain unless you are told to do so. Check with your pharmacist if you have questions about the best way to throw out drugs. There may be drug take-back programs in your area. General drug facts  . If your symptoms or health problems do not get better or if they become worse, call your doctor.  . Do not share your drugs with others and do not take anyone else's drugs.  [name] Keep a list of all your drugs (prescription, natural products, vitamins, OTC) with you. Give this list to your doctor.  . Talk with the doctor before starting any new drug, including prescription or OTC, natural products, or vitamins.  . Some drugs may have another patient information leaflet. If you have any questions about this drug, please talk with your doctor, nurse, pharmacist, or other health care provider.  . If you think there has been an overdose, call your poison control center or get medical care right away. Be ready to tell or show what was taken, how much, and when it happened.

## 2017-02-12 NOTE — Telephone Encounter (Signed)
Gave patient AVS and calender per 4/26 los- only scheduled RTC - no additional infusions need at this time per LOS

## 2017-02-13 LAB — MULTIPLE MYELOMA PANEL, SERUM
ALBUMIN SERPL ELPH-MCNC: 3.4 g/dL (ref 2.9–4.4)
ALPHA 1: 0.2 g/dL (ref 0.0–0.4)
Albumin/Glob SerPl: 1.9 — ABNORMAL HIGH (ref 0.7–1.7)
Alpha2 Glob SerPl Elph-Mcnc: 0.5 g/dL (ref 0.4–1.0)
B-Globulin SerPl Elph-Mcnc: 0.7 g/dL (ref 0.7–1.3)
GAMMA GLOB SERPL ELPH-MCNC: 0.4 g/dL (ref 0.4–1.8)
GLOBULIN, TOTAL: 1.8 g/dL — AB (ref 2.2–3.9)
IGA/IMMUNOGLOBULIN A, SERUM: 15 mg/dL — AB (ref 61–437)
IgG, Qn, Serum: 454 mg/dL — ABNORMAL LOW (ref 700–1600)
IgM, Qn, Serum: 30 mg/dL (ref 20–172)
Total Protein: 5.2 g/dL — ABNORMAL LOW (ref 6.0–8.5)

## 2017-02-15 NOTE — Progress Notes (Signed)
[name]    HEMATOLOGY/ONCOLOGY CLINIC NOTE  Date of Service: .02/12/2017  Patient Care Team: Sandi Mariscal, MD as PCP - General (Internal Medicine)  CHIEF COMPLAINTS/PURPOSE OF CONSULTATION:  f/u for Multiple Myeloma  HISTORY OF PRESENTING ILLNESS:  plz see previous note for details on HPI  INTERVAL HISTORY  Mr Bass is here for his scheduled followup for myeloma along with his wife for reevaluation. He notes that his neuropathy has been getting more bothersome. He has also been having diarrhea with cyclophosphamide which is now resolving off this treatment. We discussed that given his functional status and increasing neuropathy and near complete response to treatment thus far ,that we should hold off on additional aggressive treatment with CyBorD at this time and  switch to maintenance treatment with by mouth Revlimid . Patient and his wife are agreeable to this plan. His renal function is now stable enough to use Revlimid. He is on Coumadin that should serve as adequate anticoagulation prophylaxis for Revlimid. Prescription will be sent for Revlimid 10 mg daily by mouth , 3 weeks on 1 week off. Decubitus ulcer is healing well. Good appetite and improved energy levels. No SOB/CP No fevers/chills/night sweats. No overt bleeding.  Serum Kappa FLC down to 26.4. With a K/L ratio of 4.13.   MEDICAL HISTORY:  Past Medical History:  Diagnosis Date  . Arthritis    "left shoulder" (07/31/2016)  . Coronary artery disease   . GERD (gastroesophageal reflux disease)   . Heart murmur   . High cholesterol   . Hypertension   . Multiple myeloma ([place])   . Sleep apnea    "probably; having test in November" (07/31/2016)  . Type II diabetes mellitus ([place])     SURGICAL HISTORY: Past Surgical History:  Procedure Laterality Date  . CARDIAC CATHETERIZATION N/A 07/31/2016   Procedure: Left Heart Cath and Coronary Angiography;  Surgeon: Lorretta Harp, MD;  Location: [facility];  Service:  Cardiovascular;  Laterality: N/A;  . CARDIAC CATHETERIZATION N/A 07/31/2016   Procedure: Coronary Stent Intervention;  Surgeon: Lorretta Harp, MD;  Location: [facility];  Service: Cardiovascular;  Laterality: N/A;  . CORONARY ANGIOPLASTY    . SHOULDER SURGERY Left 1973   "put pin in it where it had separated"   . TONSILLECTOMY  ~ 1956    SOCIAL HISTORY: Social History   Social History  . Marital status: Divorced    Spouse name: N/A  . Number of children: N/A  . Years of education: N/A   Occupational History  . Not on file.   Social History Main Topics  . Smoking status: Never Smoker  . Smokeless tobacco: Never Used  . Alcohol use Yes     Comment: 07/31/2016 "nothing since 2002"  . Drug use: No  . Sexual activity: Not Currently   Other Topics Concern  . Not on file   Social History Narrative  . No narrative on file    FAMILY HISTORY: Family History  Problem Relation Age of Onset  . Hypertension Other     ALLERGIES:  has No Known Allergies.  MEDICATIONS:  Current Outpatient Prescriptions  Medication Sig Dispense Refill  . acyclovir (ZOVIRAX) 400 MG tablet Take 1 tablet (400 mg total) by mouth 2 (two) times daily. 60 tablet 3  . atorvastatin (LIPITOR) 40 MG tablet Take 1 tablet (40 mg total) by mouth at bedtime. 30 tablet 12  . bisacodyl (DULCOLAX) 5 MG EC tablet Take 1 tablet (5 mg total) by  mouth daily as needed for moderate constipation. 30 tablet 0  . Cholecalciferol (VITAMIN D3) 5000 units CAPS Take 1 capsule by mouth daily.     . clopidogrel (PLAVIX) 75 MG tablet Take 1 tablet (75 mg total) by mouth daily with breakfast. 30 tablet 12  . Cyanocobalamin (B-12) 1000 MCG SUBL Place 1,000 mcg under the tongue daily. 30 each 3  . cyclophosphamide (CYTOXAN) 50 MG tablet Take 9 tablets (450 mg total) by mouth once a week. Days 1, 8, and 15 of each cycle give on an empty stomach in AM. 27 tablet 1  . feeding supplement, ENSURE ENLIVE, (ENSURE ENLIVE) LIQD  Take 237 mLs by mouth 2 (two) times daily between meals. 237 mL 12  . furosemide (LASIX) 40 MG tablet     . insulin aspart (NOVOLOG) 100 UNIT/ML injection Correction coverage: Sensitive (thin, NPO, renal)  CBG < 70: implement hypoglycemia protocol  CBG 70 - 120: 0 units  CBG 121 - 150: 1 unit  CBG 151 - 200: 2 units  CBG 201 - 250: 3 units  CBG 251 - 300: 5 units  CBG 301 - 350: 7 units  CBG 351 - 400 9 units  CBG > 400 call MD and obtain STAT lab verification 10 mL 11  . montelukast (SINGULAIR) 10 MG tablet Take 10 mg by mouth at bedtime.    . nitroGLYCERIN (NITROSTAT) 0.3 MG SL tablet Place 0.3 mg under the tongue as needed for chest pain.    [name] omeprazole (PRILOSEC) 40 MG capsule     . ondansetron (ZOFRAN) 8 MG tablet Take 1 tablet (8 mg total) by mouth 2 (two) times daily as needed for refractory nausea / vomiting. Starting on days 4 and 11 not on day 8. 30 tablet 1  . oxyCODONE (OXY IR/ROXICODONE) 5 MG immediate release tablet Take 2 tablets (10 mg total) by mouth every 4 (four) hours as needed for severe pain. 60 tablet 0  . polyethylene glycol (MIRALAX / GLYCOLAX) packet Take 17 g by mouth daily. 14 each 0  . potassium chloride SA (K-DUR,KLOR-CON) 20 MEQ tablet Take 1 tablet (20 mEq total) by mouth daily.    . prochlorperazine (COMPAZINE) 10 MG tablet Take 1 tablet (10 mg total) by mouth every 6 (six) hours as needed (Nausea or vomiting). 30 tablet 1  . warfarin (COUMADIN) 6 MG tablet Take 4 mg by mouth every morning.     . carvedilol (COREG) 12.5 MG tablet Take 1 tablet (12.5 mg total) by mouth 2 (two) times daily with a meal. 60 tablet 2  . lenalidomide (REVLIMID) 10 MG capsule 61m orally daily for 3 weeks on 1week off 21 capsule 2   No current facility-administered medications for this visit.     REVIEW OF SYSTEMS:    10 Point review of Systems was done is negative except as noted above.  PHYSICAL EXAMINATION: ECOG PERFORMANCE STATUS: 3 - Symptomatic, >50% confined to  bed  . Vitals:   02/12/17 1243  BP: (!) 96/56  Pulse: 67  Resp: 19  Temp: 98 F (36.7 C)   Filed Weights   02/12/17 1243  Weight: 174 lb 6.4 oz (79.1 kg)   .Body mass index is 25.02 kg/m.  GENERAL: NAD SKIN: petechiae over upper extremities OROPHARYNX: MMM  NECK: supple,+ JVD, LYMPH: no palpable lymphadenopathy in the cervical, axillary or inguinal LUNGS: CTA b/l HEART: S1-S2 irreg ABDOMEN: abdomen soft, non-tender, normoactive bowel sounds, no hepatosplenomegaly palpable PSYCH: Alert and oriented 3  NEURO:  non focal. LE : b/l trace pedal edema   LABORATORY DATA:  I have reviewed the data as listed . CBC Latest Ref Rng & Units 02/09/2017 01/26/2017 01/19/2017  WBC 4.0 - 10.3 10e3/uL 3.8(L) 4.3 2.6(L)  Hemoglobin 13.0 - 17.1 g/dL 10.7(L) 11.2(L) 10.6(L)  Hematocrit 38.4 - 49.9 % 33.2(L) 34.2(L) 32.9(L)  Platelets 140 - 400 10e3/uL 154 111(L) 161   . CMP Latest Ref Rng & Units 02/09/2017 02/09/2017 01/26/2017  Glucose 70 - 140 mg/dl 104 - 115  BUN 7.0 - 26.0 mg/dL 20.3 - 19.6  Creatinine 0.7 - 1.3 mg/dL 0.9 - 0.9  Sodium 136 - 145 mEq/L 143 - 143  Potassium 3.5 - 5.1 mEq/L 4.3 - 4.0  Chloride 101 - 111 mmol/L - - -  CO2 22 - 29 mEq/L 24 - 24  Calcium 8.4 - 10.4 mg/dL 8.9 - 8.9  Total Protein 6.0 - 8.5 g/dL 5.2(L) 5.2(L) 5.4(L)  Total Bilirubin 0.20 - 1.20 mg/dL 1.16 - 1.42(H)  Alkaline Phos 40 - 150 U/L 97 - 116  AST 5 - 34 U/L 24 - 29  ALT 0 - 55 U/L 22 - 23      RADIOGRAPHIC STUDIES: I have personally reviewed the radiological images as listed and agreed with the findings in the report. No results found.  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  K/L 95, No M spike on SPEP -- suggests light chain MM Cytogenetics - normal male chromosomes FISH- +11 and 13q-/-13  Patient is s/p 1 cycle of Vd and Serum free kappa LC has decreased from 700 to 203.6 with improvement in his K/L ratio from 94.59 to 29. Completed 2nd cycle of treatment with Vd + Cytoxan (234m/m2) Completed 3rd cycle of treatment with Vd + Cytoxan (2089mm2) - cytoxan held D15 due to thrombocytopenia Completed 4th of VCd Discontinued VCd after C5D8 due to intolerance with diarrhea and increasing neuropathy .  Kappa free light chains have improved progressively from 700 down to around 26 with improivng K/L ratio.  2) severe hypercalcemia calcium a more than 15 now down to normal 9 This is likely due to MM resolved with bisphosphonates, forced saline diuresis and Steroids in the hospital  3) CAD s/p prox LAD DES PCI on 07/31/2016 with ischemic cardiomyopathy ejection fraction of 25-35%. Rpt ECHO on 10/03/2016 shows improvement in EF to 55-60% with no RWMABN.  4) Macrocytic Anemia with moderate thrombocytopenia - due to MM and B12 def hgb 11  5) Thrombocytopenia - due to MM and treatment --resolved and currently are at 154k   6) B12 deficiency - Received [place] B12 daily while in the hopsital -cont SL B12 100022mpo daily  PLAN -Given the patient's worsening neuropathy , tenuous overall health condition with multiple medical comorbidities , diarrhea, decubitus ulcer... We will hold off on continued aggressive treatment at this time since the patient's disease has responded well and is relatively stable.  -We shall switch him to maintenance Revlimid 10 mg by mouth daily 3 weeks on one week off. -  If he tolerates that Revlimid but  shows signs of progression we could potentially treat him with Rd in the future. -continue on antiplatelet therapy .  patient is on Plavix for CAD and also on warfarin for atrial fibrillation . -on Acyclovir prophylaxis - will need to be continued for at least 6 months after discontinuation of Velcade . -continue q4weeks IV Pamidronate  -Transfuse when necessary to keep the hemoglobin close to/above 9given his significant cardiac comorbidities. - patient is now off Oxycontin and  is using only prn oxycodone for neoplasm related pain control.  -He has issues with chronic back pain due to degenerative disc disease for which he needs to have a care plan with his primary care physician since we intend to wean him off narcotics given his myeloma-related bone disease has mostly resolved.  6) b/l Lower extremity swelling likely due to CHF an CKD. Much improved. Plan -continued adjustment and monitoring of diuretic therapy as per PCP and cardiology -Potassium levels are normal today.  [name]7) CAD s/p recent DES 8) Afib with RVR on coumadin -continue mx per PCP and cardiology  9) Decubitus Ulcers on buttocks -Stage 2 - improving/healing. -wound care RN mx this.   Will discontinue remain part of this cycle of CyBorD. No further scheduled infusion room treatment at this time. -planning to switch to maintenance po Revlimid -RTC with Dr Irene Limbo in 3 weeks with labs for Toxicity check    All of the patients questions were answered with apparent satisfaction. The patient knows to call the clinic with any problems, questions or concerns.  I spent 30 minutes counseling the patient face to face. The total time spent in the appointment was 40 minutes and more than 50% was on counseling and direct patient cares.    Sullivan Lone MD [place] [facility] Hematology/Oncology Physician [facility]  (Office):       [phone] (Work cell):  [phone] (Fax):           [phone]

## 2017-02-16 ENCOUNTER — Ambulatory Visit: Payer: Medicare HMO

## 2017-02-16 ENCOUNTER — Other Ambulatory Visit: Payer: Medicare HMO

## 2017-02-17 ENCOUNTER — Other Ambulatory Visit: Payer: Self-pay | Admitting: *Deleted

## 2017-02-17 MED ORDER — LENALIDOMIDE 10 MG PO CAPS: ORAL_CAPSULE | ORAL | 0 refills | Status: DC

## 2017-02-19 ENCOUNTER — Ambulatory Visit: Payer: Medicare HMO

## 2017-02-23 ENCOUNTER — Other Ambulatory Visit: Payer: Self-pay | Admitting: *Deleted

## 2017-02-23 MED ORDER — OXYCODONE HCL 5 MG PO TABS: 10.0000 mg | ORAL_TABLET | ORAL | 0 refills | Status: DC | PRN

## 2017-02-24 ENCOUNTER — Other Ambulatory Visit: Payer: Self-pay | Admitting: Hematology

## 2017-02-24 DIAGNOSIS — C9000 Multiple myeloma not having achieved remission: Secondary | ICD-10-CM

## 2017-03-02 ENCOUNTER — Telehealth: Payer: Self-pay | Admitting: Pharmacist

## 2017-03-02 DIAGNOSIS — C9000 Multiple myeloma not having achieved remission: Secondary | ICD-10-CM

## 2017-03-02 MED ORDER — LENALIDOMIDE 10 MG PO CAPS: ORAL_CAPSULE | ORAL | 0 refills | Status: DC

## 2017-03-02 NOTE — Telephone Encounter (Signed)
Oral Chemotherapy Pharmacist Encounter  New prescription for Revlimid for maintenance treatment of multiple myeloma 02/09/17 labs reviewed, OK for treatment Current medication list in Epic assessed, no significant DDIs with Revlimid identified  Prescription has been e-scribed to [facility] will continue to follow.  [name], PharmD, BCPS, BCOP 03/02/2017  10:09 AM Oral Oncology Clinic [phone]

## 2017-03-05 ENCOUNTER — Telehealth: Payer: Self-pay | Admitting: Hematology

## 2017-03-05 ENCOUNTER — Encounter: Payer: Self-pay | Admitting: Hematology

## 2017-03-05 ENCOUNTER — Other Ambulatory Visit (HOSPITAL_COMMUNITY)
Admission: RE | Admit: 2017-03-05 | Discharge: 2017-03-05 | Disposition: A | Discharge status: Home / Self Care | Payer: Medicare HMO | Source: Ambulatory Visit | Attending: Hematology | Admitting: Hematology

## 2017-03-05 ENCOUNTER — Ambulatory Visit (HOSPITAL_BASED_OUTPATIENT_CLINIC_OR_DEPARTMENT_OTHER): Payer: Medicare HMO | Admitting: Hematology

## 2017-03-05 ENCOUNTER — Other Ambulatory Visit (HOSPITAL_BASED_OUTPATIENT_CLINIC_OR_DEPARTMENT_OTHER): Payer: Medicare HMO

## 2017-03-05 VITALS — BP 89/54 | HR 63 | Temp 97.9°F | Resp 18 | Ht 70.0 in | Wt 164.6 lb

## 2017-03-05 DIAGNOSIS — C9000 Multiple myeloma not having achieved remission: Secondary | ICD-10-CM | POA: Insufficient documentation

## 2017-03-05 DIAGNOSIS — D696 Thrombocytopenia, unspecified: Secondary | ICD-10-CM | POA: Diagnosis not present

## 2017-03-05 DIAGNOSIS — E538 Deficiency of other specified B group vitamins: Secondary | ICD-10-CM | POA: Diagnosis not present

## 2017-03-05 DIAGNOSIS — R197 Diarrhea, unspecified: Secondary | ICD-10-CM

## 2017-03-05 DIAGNOSIS — I10 Essential (primary) hypertension: Secondary | ICD-10-CM

## 2017-03-05 LAB — CBC & DIFF AND RETIC
BASO%: 1 % (ref 0.0–2.0)
BASOS ABS: 0.1 10*3/uL (ref 0.0–0.1)
EOS%: 3.3 % (ref 0.0–7.0)
Eosinophils Absolute: 0.2 10*3/uL (ref 0.0–0.5)
HEMATOCRIT: 35.6 % — AB (ref 38.4–49.9)
HGB: 11.7 g/dL — ABNORMAL LOW (ref 13.0–17.1)
Immature Retic Fract: 2.3 % — ABNORMAL LOW (ref 3.00–10.60)
LYMPH%: 9.8 % — AB (ref 14.0–49.0)
MCH: 31.3 pg (ref 27.2–33.4)
MCHC: 32.9 g/dL (ref 32.0–36.0)
MCV: 95.2 fL (ref 79.3–98.0)
MONO#: 0.3 10*3/uL (ref 0.1–0.9)
MONO%: 6.3 % (ref 0.0–14.0)
NEUT#: 3.9 10*3/uL (ref 1.5–6.5)
NEUT%: 79.6 % — AB (ref 39.0–75.0)
PLATELETS: 130 10*3/uL — AB (ref 140–400)
RBC: 3.74 10*6/uL — AB (ref 4.20–5.82)
RDW: 15.6 % — AB (ref 11.0–14.6)
RETIC CT ABS: 25.06 10*3/uL — AB (ref 34.80–93.90)
Retic %: 0.67 % — ABNORMAL LOW (ref 0.80–1.80)
WBC: 4.9 10*3/uL (ref 4.0–10.3)
lymph#: 0.5 10*3/uL — ABNORMAL LOW (ref 0.9–3.3)

## 2017-03-05 LAB — COMPREHENSIVE METABOLIC PANEL
ALT: 24 U/L (ref 0–55)
ANION GAP: 7 meq/L (ref 3–11)
AST: 25 U/L (ref 5–34)
Albumin: 3.6 g/dL (ref 3.5–5.0)
Alkaline Phosphatase: 99 U/L (ref 40–150)
BUN: 23.3 mg/dL (ref 7.0–26.0)
CALCIUM: 9.2 mg/dL (ref 8.4–10.4)
CHLORIDE: 111 meq/L — AB (ref 98–109)
CO2: 25 mEq/L (ref 22–29)
CREATININE: 1.2 mg/dL (ref 0.7–1.3)
EGFR: 63 mL/min/{1.73_m2} — AB (ref >90)
Glucose: 108 mg/dl (ref 70–140)
Potassium: 4.2 mEq/L (ref 3.5–5.1)
Sodium: 143 mEq/L (ref 136–145)
Total Bilirubin: 0.67 mg/dL (ref 0.20–1.20)
Total Protein: 5.7 g/dL — ABNORMAL LOW (ref 6.4–8.3)

## 2017-03-05 LAB — C DIFFICILE QUICK SCREEN W PCR REFLEX
C DIFFICILE (CDIFF) INTERP: NOT DETECTED
C DIFFICLE (CDIFF) ANTIGEN: NEGATIVE
C Diff toxin: NEGATIVE

## 2017-03-05 MED ORDER — OXYCODONE HCL 5 MG PO TABS: 10.0000 mg | ORAL_TABLET | Freq: Four times a day (QID) | ORAL | 0 refills | Status: DC | PRN

## 2017-03-05 MED ORDER — LACTINEX PO CHEW: 1.0000 | CHEWABLE_TABLET | Freq: Three times a day (TID) | ORAL | 0 refills | Status: DC

## 2017-03-05 MED ORDER — VANCOMYCIN HCL 125 MG PO CAPS: 125.0000 mg | ORAL_CAPSULE | Freq: Four times a day (QID) | ORAL | 0 refills | Status: DC

## 2017-03-05 NOTE — Progress Notes (Signed)
[name]    HEMATOLOGY/ONCOLOGY CLINIC NOTE  Date of Service: .03/05/2017  Patient Care Team: Sandi Mariscal, MD as PCP - General (Internal Medicine)  CHIEF COMPLAINTS/PURPOSE OF CONSULTATION:  f/u for Multiple Myeloma  HISTORY OF PRESENTING ILLNESS:  plz see previous note for details on HPI  INTERVAL HISTORY  Richard Vang is here for his scheduled followup for myeloma along with his wife for reevaluation. He notes that the neuropathy in his feet is getting better. His decubitus ulcers healed. 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 I told him I would give him a refill on his oxycodone one last time but that he would need to transition this to his primary care doctor. She is open to the idea of getting outpatient rehabilitation. Place an order for cancer rehabilitation referral. He notes that his primary care doctor is also working on home PT and OT but this has not happened yet. He continues to have some diarrhea which is resolving. He has been recommended to get a stool study to rule out C. difficile and to a GI pathogen panel. We'll empirically treat him for C. difficile with oral vancomycin and start him on some probiotics. He is still awaiting approval of his Revlimid . He notes that he had a fall due to his legs feeling weak and we discussed that rehabilitation is going to be extremely important for him and that his functional status would limit treatment options .  No other acute new symptoms .  MEDICAL HISTORY:  Past Medical History:  Diagnosis Date  . Arthritis    "left shoulder" (07/31/2016)  . Coronary artery disease   . GERD (gastroesophageal reflux disease)   . Heart murmur   . High cholesterol   . Hypertension   . Multiple myeloma ([place])   . Sleep apnea    "probably; having test in November" (07/31/2016)  . Type II diabetes mellitus ([place])     SURGICAL HISTORY: Past Surgical  History:  Procedure Laterality Date  . CARDIAC CATHETERIZATION N/A 07/31/2016   Procedure: Left Heart Cath and Coronary Angiography;  Surgeon: Lorretta Harp, MD;  Location: [facility];  Service: Cardiovascular;  Laterality: N/A;  . CARDIAC CATHETERIZATION N/A 07/31/2016   Procedure: Coronary Stent Intervention;  Surgeon: Lorretta Harp, MD;  Location: [facility];  Service: Cardiovascular;  Laterality: N/A;  . CORONARY ANGIOPLASTY    . SHOULDER SURGERY Left 1973   "put pin in it where it had separated"   . TONSILLECTOMY  ~ 1956    SOCIAL HISTORY: Social History   Social History  . Marital status: Divorced    Spouse name: N/A  . Number of children: N/A  . Years of education: N/A   Occupational History  . Not on file.   Social History Main Topics  . Smoking status: Never Smoker  . Smokeless tobacco: Never Used  . Alcohol use Yes     Comment: 07/31/2016 "nothing since 2002"  . Drug use: No  . Sexual activity: Not Currently   Other Topics Concern  . Not on file   Social History Narrative  . No narrative on file    FAMILY HISTORY: Family History  Problem Relation Age of Onset  . Hypertension Other     ALLERGIES:  has No Known Allergies.  MEDICATIONS:  Current Outpatient Prescriptions  Medication Sig Dispense Refill  . acyclovir (ZOVIRAX) 400 MG tablet TAKE ONE  TABLET BY MOUTH TWICE DAILY 60 tablet 3  . atorvastatin (LIPITOR) 40 MG tablet Take 1 tablet (40 mg total) by mouth at bedtime. 30 tablet 12  . bisacodyl (DULCOLAX) 5 MG EC tablet Take 1 tablet (5 mg total) by mouth daily as needed for moderate constipation. 30 tablet 0  . carvedilol (COREG) 12.5 MG tablet Take 1 tablet (12.5 mg total) by mouth 2 (two) times daily with a meal. 60 tablet 2  . Cholecalciferol (VITAMIN D3) 5000 units CAPS Take 1 capsule by mouth daily.     . clopidogrel (PLAVIX) 75 MG tablet Take 1 tablet (75 mg total) by mouth daily with breakfast. 30 tablet 12  .  Cyanocobalamin (B-12) 1000 MCG SUBL Place 1,000 mcg under the tongue daily. 30 each 3  . cyclophosphamide (CYTOXAN) 50 MG tablet Take 9 tablets (450 mg total) by mouth once a week. Days 1, 8, and 15 of each cycle give on an empty stomach in AM. 27 tablet 1  . feeding supplement, ENSURE ENLIVE, (ENSURE ENLIVE) LIQD Take 237 mLs by mouth 2 (two) times daily between meals. 237 mL 12  . furosemide (LASIX) 40 MG tablet     . insulin aspart (NOVOLOG) 100 UNIT/ML injection Correction coverage: Sensitive (thin, NPO, renal)  CBG < 70: implement hypoglycemia protocol  CBG 70 - 120: 0 units  CBG 121 - 150: 1 unit  CBG 151 - 200: 2 units  CBG 201 - 250: 3 units  CBG 251 - 300: 5 units  CBG 301 - 350: 7 units  CBG 351 - 400 9 units  CBG > 400 call MD and obtain STAT lab verification 10 mL 11  . lenalidomide (REVLIMID) 10 MG capsule 76m orally daily for 3 weeks on 1week off Auth # [id number](Valid for 30 days) 21 capsule 0  . montelukast (SINGULAIR) 10 MG tablet Take 10 mg by mouth at bedtime.    . nitroGLYCERIN (NITROSTAT) 0.3 MG SL tablet Place 0.3 mg under the tongue as needed for chest pain.    .[name] Kitchenomeprazole (PRILOSEC) 40 MG capsule     . ondansetron (ZOFRAN) 8 MG tablet Take 1 tablet (8 mg total) by mouth 2 (two) times daily as needed for refractory nausea / vomiting. Starting on days 4 and 11 not on day 8. 30 tablet 1  . oxyCODONE (OXY IR/ROXICODONE) 5 MG immediate release tablet Take 2 tablets (10 mg total) by mouth every 4 (four) hours as needed for severe pain. 60 tablet 0  . polyethylene glycol (MIRALAX / GLYCOLAX) packet Take 17 g by mouth daily. 14 each 0  . potassium chloride SA (K-DUR,KLOR-CON) 20 MEQ tablet Take 1 tablet (20 mEq total) by mouth daily.    . prochlorperazine (COMPAZINE) 10 MG tablet Take 1 tablet (10 mg total) by mouth every 6 (six) hours as needed (Nausea or vomiting). 30 tablet 1  . warfarin (COUMADIN) 6 MG tablet Take 4 mg by mouth every morning.      No current  facility-administered medications for this visit.     REVIEW OF SYSTEMS:    10 Point review of Systems was done is negative except as noted above.  PHYSICAL EXAMINATION: ECOG PERFORMANCE STATUS: 3 - Symptomatic, >50% confined to bed  . Vitals:   03/05/17 1157  BP: (!) 89/54  Pulse: 63  Resp: 18  Temp: 97.9 F (36.6 C)   Filed Weights   03/05/17 1157  Weight: 164 lb 9.6 oz (74.7 kg)   .Body mass index  is 23.62 kg/m.  GENERAL: NAD SKIN: petechiae over upper extremities OROPHARYNX: MMM  NECK: supple,+ JVD, LYMPH: no palpable lymphadenopathy in the cervical, axillary or inguinal LUNGS: CTA b/l HEART: S1-S2 irreg ABDOMEN: abdomen soft, non-tender, normoactive bowel sounds, no hepatosplenomegaly palpable PSYCH: Alert and oriented 3  NEURO: non focal. LE : b/l trace pedal edema   LABORATORY DATA:  I have reviewed the data as listed . CBC Latest Ref Rng & Units 03/05/2017 02/09/2017 01/26/2017  WBC 4.0 - 10.3 10e3/uL 4.9 3.8(L) 4.3  Hemoglobin 13.0 - 17.1 g/dL 11.7(L) 10.7(L) 11.2(L)  Hematocrit 38.4 - 49.9 % 35.6(L) 33.2(L) 34.2(L)  Platelets 140 - 400 10e3/uL 130(L) 154 111(L)   . CBC    Component Value Date/Time   WBC 4.9 03/05/2017 1113   WBC 3.4 (L) 10/08/2016 0234   RBC 3.74 (L) 03/05/2017 1113   RBC 3.05 (L) 10/08/2016 0234   HGB 11.7 (L) 03/05/2017 1113   HCT 35.6 (L) 03/05/2017 1113   PLT 130 (L) 03/05/2017 1113   MCV 95.2 03/05/2017 1113   MCH 31.3 03/05/2017 1113   MCH 30.2 10/08/2016 0234   MCHC 32.9 03/05/2017 1113   MCHC 30.4 10/08/2016 0234   RDW 15.6 (H) 03/05/2017 1113   LYMPHSABS 0.5 (L) 03/05/2017 1113   MONOABS 0.3 03/05/2017 1113   EOSABS 0.2 03/05/2017 1113   BASOSABS 0.1 03/05/2017 1113    . CMP Latest Ref Rng & Units 03/05/2017 02/09/2017 02/09/2017  Glucose 70 - 140 mg/dl 108 104 -  BUN 7.0 - 26.0 mg/dL 23.3 20.3 -  Creatinine 0.7 - 1.3 mg/dL 1.2 0.9 -  Sodium 136 - 145 mEq/L 143 143 -  Potassium 3.5 - 5.1 mEq/L 4.2 4.3 -    Chloride 101 - 111 mmol/L - - -  CO2 22 - 29 mEq/L 25 24 -  Calcium 8.4 - 10.4 mg/dL 9.2 8.9 -  Total Protein 6.4 - 8.3 g/dL 5.7(L) 5.2(L) 5.2(L)  Total Bilirubin 0.20 - 1.20 mg/dL 0.67 1.16 -  Alkaline Phos 40 - 150 U/L 99 97 -  AST 5 - 34 U/L 25 24 -  ALT 0 - 55 U/L 24 22 -      RADIOGRAPHIC STUDIES: I have personally reviewed the radiological images as listed and agreed with the findings in the report. No results found.  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  K/L 95, No M spike on SPEP -- suggests light chain MM Cytogenetics - normal male chromosomes FISH- +11 and 13q-/-13  Patient is s/p 1 cycle of Vd and Serum free kappa LC has decreased from 700 to 203.6 with improvement in his K/L ratio from 94.59 to 29. Completed 2nd cycle of treatment with Vd + Cytoxan (248m/m2) Completed 3rd cycle of treatment with Vd + Cytoxan (2044mm2) - cytoxan held D15 due to thrombocytopenia Completed 4th of VCd Discontinued VCd after C5D8 due to intolerance with diarrhea and increasing neuropathy and fatigue with borderline functional status.  Kappa free light chains have improved progressively from 700 down to around 26  with improving K/L ratio.  2) CAD s/p prox LAD DES PCI on 07/31/2016 with ischemic cardiomyopathy ejection fraction of 25-35%. Rpt ECHO on 10/03/2016 shows improvement in EF to 55-60% with no RWMABN.  3) Macrocytic Anemia with  moderate thrombocytopenia - due to MM and B12 def hgb has improved to 11.7  5) Thrombocytopenia - due to MM and treatment --resolved and currently are at  130k   6) B12 deficiency - Received [place] B12 daily while in the hopsital -cont SL B12 1073mg po daily  PLAN -Given a referral to cancer rehabilitation due to his fatigue and for muscle strengthening and balance . -Awaiting delivery of  maintenance Revlimid 10 mg by mouth daily 3 weeks on one week off. -If he tolerates that Revlimid but shows signs of progression we could potentially treat him with Rd in the future. -continue on antiplatelet therapy .  patient is on Plavix for CAD and also on warfarin for atrial fibrillation . -on Acyclovir prophylaxis - will need to be continued for at least 6 months after discontinuation of Velcade . -continue q4weeks IV Pamidronate  -patient is off Oxycontin and on prn oxycodone. No significant myeloma-related pain at this tim -He has issues with chronic back pain due to degenerative disc disease for which he needs to have a care plan with his primary care physician. He was given a prescription for oxycodone this one last time to allow him adequate time to transition.  6) b/l Lower extremity swelling likely due to CHF an CKD. Much improved. Plan -continued adjustment and monitoring of diuretic therapy as per PCP and cardiology -Potassium levels are normal today.  .[name]) CAD s/p recent DES 8) Afib with RVR on coumadin -continue mx per PCP and cardiology  9) Decubitus Ulcers on buttocks -healed -wound care RN mx this.  10) diarrhea - persistent but somewhat improved. -Stool studies for GI pathogen panel and C. Difficile. -We'll empirically treat with oral vancomycin for C. difficile and also given prescription for probiotics. -Maintain good hydration. -Might need to cut down acyclovir dose if his diarrhea does not improve. -Patient is not currently on any laxatives.  Will discontinue remain  part of this cycle of CyBorD. No further scheduled infusion room treatment at this time. -planning to switch to maintenance po Revlimid -RTC with Dr KIrene Limboin 3 weeks with labs for Toxicity check   Needs to pick up stools container from lab today for stool studies Continue IV Pamidronate q4 weeks RTC in 4 weeks with labs Ambulatory referral to cancer rehabilitation program   All of the patients questions were answered with apparent satisfaction. The patient knows to call the clinic with any problems, questions or concerns.  I spent 30 minutes counseling the patient face to face. The total time spent in the appointment was 40 minutes and more than 50% was on counseling and direct patient cares.    GSullivan LoneMD MParkerAAHIVMS SHattiesburg Clinic Ambulatory Surgery CenterCMarshall Browning HospitalHematology/Oncology Physician CPortland Va [facility] Center (Office):       [phone](Work cell):  [phone](Fax):           [phone]

## 2017-03-05 NOTE — Telephone Encounter (Signed)
Gave patient AVS and calender per 5/17 los.  

## 2017-03-06 LAB — GASTROINTESTINAL PANEL BY PCR, STOOL (REPLACES STOOL CULTURE)
ADENOVIRUS F40/41: NOT DETECTED
ASTROVIRUS: NOT DETECTED
Campylobacter species: NOT DETECTED
Cryptosporidium: NOT DETECTED
Cyclospora cayetanensis: NOT DETECTED
ENTEROAGGREGATIVE E COLI (EAEC): NOT DETECTED
ENTEROPATHOGENIC E COLI (EPEC): NOT DETECTED
ENTEROTOXIGENIC E COLI (ETEC): NOT DETECTED
Entamoeba histolytica: NOT DETECTED
GIARDIA LAMBLIA: NOT DETECTED
NOROVIRUS GI/GII: NOT DETECTED
Plesimonas shigelloides: NOT DETECTED
ROTAVIRUS A: NOT DETECTED
SHIGELLA/ENTEROINVASIVE E COLI (EIEC): NOT DETECTED
Salmonella species: NOT DETECTED
Sapovirus (I, II, IV, and V): NOT DETECTED
Shiga like toxin producing E coli (STEC): NOT DETECTED
VIBRIO CHOLERAE: NOT DETECTED
Vibrio species: NOT DETECTED
Yersinia enterocolitica: NOT DETECTED

## 2017-03-06 LAB — KAPPA/LAMBDA LIGHT CHAINS
Ig Kappa Free Light Chain: 31.9 mg/L — ABNORMAL HIGH (ref 3.3–19.4)
Ig Lambda Free Light Chain: 9.4 mg/L (ref 5.7–26.3)
Kappa/Lambda FluidC Ratio: 3.39 — ABNORMAL HIGH (ref 0.26–1.65)

## 2017-03-06 NOTE — Telephone Encounter (Signed)
Oral Chemotherapy Pharmacist Encounter  Received notification from [facility] that patient's Revlimid prescription would require prior authorization. PA submitted on CoverMyMeds Key [id number] Clinical questions answered PA status is pending  Oral Oncology Clinic will continue to follow.   [name], PharmD, BCPS, BCOP 03/06/2017  10:28 AM Oral Oncology Clinic [phone]

## 2017-03-08 ENCOUNTER — Other Ambulatory Visit: Payer: Self-pay | Admitting: Hematology

## 2017-03-08 DIAGNOSIS — R5383 Other fatigue: Secondary | ICD-10-CM

## 2017-03-08 DIAGNOSIS — C9000 Multiple myeloma not having achieved remission: Secondary | ICD-10-CM

## 2017-03-09 ENCOUNTER — Telehealth: Payer: Self-pay | Admitting: *Deleted

## 2017-03-09 LAB — MULTIPLE MYELOMA PANEL, SERUM
ALBUMIN/GLOB SERPL: 1.9 — AB (ref 0.7–1.7)
ALPHA2 GLOB SERPL ELPH-MCNC: 0.6 g/dL (ref 0.4–1.0)
Albumin SerPl Elph-Mcnc: 3.5 g/dL (ref 2.9–4.4)
Alpha 1: 0.2 g/dL (ref 0.0–0.4)
B-Globulin SerPl Elph-Mcnc: 0.7 g/dL (ref 0.7–1.3)
Gamma Glob SerPl Elph-Mcnc: 0.4 g/dL (ref 0.4–1.8)
Globulin, Total: 1.9 g/dL — ABNORMAL LOW (ref 2.2–3.9)
IGA/IMMUNOGLOBULIN A, SERUM: 17 mg/dL — AB (ref 61–437)
IGM (IMMUNOGLOBIN M), SRM: 15 mg/dL — AB (ref 20–172)
IgG, Qn, Serum: 469 mg/dL — ABNORMAL LOW (ref 700–1600)
Total Protein: 5.4 g/dL — ABNORMAL LOW (ref 6.0–8.5)

## 2017-03-09 NOTE — Telephone Encounter (Signed)
Per staff message, LVM with pt stating stool sample WNL, no obvious infection.  Pt advised to complete course of abx and continue taking probiotics as prescribed.  Call back number provided for questions.

## 2017-03-09 NOTE — Telephone Encounter (Signed)
-----   Message from Brunetta Genera, MD sent at 03/09/2017 12:01 AM EDT ----- Plz let Ms Brouse know his stools studies did not show obvious infection. Complete po abx per plan and continue probiotics. Thanks, GK

## 2017-03-10 NOTE — Telephone Encounter (Addendum)
Oral Chemotherapy Pharmacist Encounter  Prior authorization for patient's Revlimid approved Effective dates: 03/06/17-09/06/17 PA Caes: [id number]  I called [facility] at [phone] to follow-up on status of patient's Revlimid.  Per Pharmacy patient has a high copayment. They have left patient 3 voicemail over past 2 days to discuss copayment with patient.  They do have a financial assistance team to help patient identify copayment assistance.  I called and LVM for patient with above information and request for patient to call pharmacy to discuss copayment support they can offer. I left [employer]'s phone number as above for patient to call. I also left number to Oral Oncology Clinic if patient has any questions about the process.  Oral Oncology Clinic will continue to follow.  [name], PharmD, BCPS, BCOP 03/10/2017  9:55 AM Oral Oncology Clinic [phone]

## 2017-03-11 ENCOUNTER — Ambulatory Visit: Payer: Medicare HMO | Admitting: Physical Therapy

## 2017-03-11 ENCOUNTER — Ambulatory Visit: Payer: Medicare HMO | Attending: Hematology | Admitting: Physical Therapy

## 2017-03-11 DIAGNOSIS — R293 Abnormal posture: Secondary | ICD-10-CM

## 2017-03-11 DIAGNOSIS — R262 Difficulty in walking, not elsewhere classified: Secondary | ICD-10-CM | POA: Insufficient documentation

## 2017-03-11 DIAGNOSIS — M6281 Muscle weakness (generalized): Secondary | ICD-10-CM | POA: Insufficient documentation

## 2017-03-11 DIAGNOSIS — G8929 Other chronic pain: Secondary | ICD-10-CM | POA: Insufficient documentation

## 2017-03-11 DIAGNOSIS — M545 Low back pain: Secondary | ICD-10-CM | POA: Insufficient documentation

## 2017-03-11 NOTE — Therapy (Signed)
[place], [place], [address] Phone: [phone]   Fax:  [phone]  Physical Therapy Evaluation  Patient Details  Name: Richard Vang MRN: [id number] Date of Birth: Feb 18, 1946 Referring Provider: Dr. Sullivan Lone  Encounter Date: 03/11/2017      PT End of Session - 03/11/17 1717    Visit Number 1   Number of Visits 14   Date for PT Re-Evaluation 05/17/17   PT Start Time 1518   PT Stop Time 1610   PT Time Calculation (min) 52 min   Activity Tolerance Patient tolerated treatment well   Behavior During Therapy [facility] for tasks assessed/performed      Past Medical History:  Diagnosis Date  . Arthritis    "left shoulder" (07/31/2016)  . Coronary artery disease   . GERD (gastroesophageal reflux disease)   . Heart murmur   . High cholesterol   . Hypertension   . Multiple myeloma ([place])   . Sleep apnea    "probably; having test in November" (07/31/2016)  . Type II diabetes mellitus ([place])     Past Surgical History:  Procedure Laterality Date  . CARDIAC CATHETERIZATION N/A 07/31/2016   Procedure: Left Heart Cath and Coronary Angiography;  Surgeon: Lorretta Harp, MD;  Location: [facility];  Service: Cardiovascular;  Laterality: N/A;  . CARDIAC CATHETERIZATION N/A 07/31/2016   Procedure: Coronary Stent Intervention;  Surgeon: Lorretta Harp, MD;  [facility];  Service: Cardiovascular;  Laterality: N/A;  . CORONARY ANGIOPLASTY    . SHOULDER SURGERY Left 1973   "put pin in it where it had separated"   . TONSILLECTOMY  ~ 1956    There were no vitals filed for this visit.       Subjective Assessment - 03/11/17 1522    Subjective The doctor told me I probably needed to come over here.  My problem was that I was getting up and falling a lot. I can feel myself falling; when I started to, I'd just sit down on the floor.   Pertinent History Light chain Multiple myeloma  with Lytic lesion with aggressive features in the right iliac bone and possibly other lytic lesions in the L spine , anemia, hypercalcemia and renal insuff (diagnosed in 09/2016)  Not on active treatment now, but trying a drug approved for maintenance.  hypertension, diabetes, dyslipidemia, coronary artery disease.    Patient Stated Goals to walk without falling, go up and down the steps to walk in the yard; get my back to quit hurting   Currently in Pain? Yes   Pain Score 9    Pain Location Back   Pain Orientation Lower   Pain Descriptors / Indicators Other (Comment)  like a pulled muscle   Pain Type Chronic pain   Pain Onset More than a month ago  November 2017   Pain Frequency Intermittent   Aggravating Factors  when the medicine runs out   Pain Relieving Factors pain meds            [facility] PT Assessment - 03/11/17 0001      Assessment   Medical Diagnosis multiple myeloma with unsteady gait   Referring Provider Dr. Sullivan Lone   Onset Date/Surgical Date 09/19/16  approx.   Hand Dominance Right   Prior Therapy none     Precautions   Precautions Fall;Other (comment)   Precaution Comments metastases; cancer precautions     Restrictions   Weight Bearing  Restrictions No     Balance Screen   Has the patient fallen in the past 6 months Yes   How many times? 6   Has the patient had a decrease in activity level because of a fear of falling?  Yes   Is the patient reluctant to leave their home because of a fear of falling?  Yes     [place] residence   Living Arrangements Spouse/significant other   Type of [place] to enter   Entrance Stairs-Number of Steps 3   Entrance Stairs-Rails Right  right going up   [place] One level     Prior Function   Level of Independence Independent with basic ADLs;Independent with household mobility with device;Needs assistance with homemaking   Vocation Retired   Leisure just  tries to walk in the house     Cognition   Overall Cognitive Status Within Functional Limits for tasks assessed     Observation/Other Assessments   Observations pale and uncomfortable looking     Functional Tests   Functional tests Sit to Stand     Sit to Stand   Comments unable to stand without pushing off with hands     Posture/Postural Control   Posture/Postural Control Postural limitations   Postural Limitations Forward head;Rounded Shoulders;Increased thoracic kyphosis  kyphosis is mid-thoracic level     ROM / Strength   AROM / PROM / Strength AROM;Strength     AROM   Overall AROM Comments shoulder AROM limited on right a moderate amount, left slightly; both LEs grossly [facility]     Strength   Overall Strength Comments LEs grossly 3/5 or better; Right UE grossly 3-/5, left 3/5 or better.  Manual muscle testing not done due to possible bone lesions     Ambulation/Gait   Ambulation/Gait Yes   Ambulation/Gait Assistance 6: Modified independent (Device/Increase time)   Ambulation Distance (Feet) 20 Feet  winded after this much without assistive device   Assistive device Rollator  appears adjusted too high   Gait Pattern --  wide base, legs er, Rt.or bilat.trendelenberg, slow w/out AD   Gait Comments has used walker since January because his back was hurting  he says backpain is what's keeping him from walking                   [facility] Adult PT Treatment/Exercise - 03/11/17 0001      Self-Care   Self-Care Other Self-Care Comments   Other Self-Care Comments  therapist adjusted height of rollator today, down several inches; it still looks about 2 inches too tall, but asked patient to try this adjustment for now                   [facility] Goals - 03/11/17 2100      CC Short Term Goal  #1   Title Patient will be independent with initial home exercise program   Time 4   Period Weeks   Status New     CC Short Term Goal  #2   Title Pt. will  demonstrate erect posture when walking in his walker for at least 50 feet.             [facility] Goals - 03/11/17 2049      CC Long Term Goal  #1   Title Pt. will be able to stand from sitting without having to push off with  his arms, indicating improved LE strength and mobility.   Time 8   Period Weeks   Status New     CC Long Term Goal  #2   Title Pt. will be able to negotiate three steps independently, safely, so that he can get in and out of his house on his own.   Time 8   Period Weeks   Status New     CC Long Term Goal  #3   Title Pt. will be able to walk 400 feet without needing to rest.   Time 8   Period Weeks     CC Long Term Goal  #4   Title If Berg balance score is below 46 initiallly, score will increase by at least 8 points, indicating improved safety with mobility.   Time 8   Period Weeks   Status New            Plan - 03/11/17 1718    Clinical Impression Statement This is a pleasant gentleman with diagnosis of multiple myeloma. He has back pain. He appears pale and unsteady on his feet, with forward head, rounded shoulders, and increased mid-thoracic kyphosis.  He walks with a rollator walker, has clear LE weakness with gait when without an assistive device, and can't stand up without pushing off from hands.  Eval is moderate complexity with multiple comorbidities including diabetes and ongoing cancer treatment; evolving also as he is still undergoing treatment.   Rehab Potential Good   PT Frequency 2x / week   PT Duration 4 weeks  then 1x/week for 4 weeks   PT Treatment/Interventions ADLs/Self Care Home Management;Electrical Stimulation;Moist Heat;Gait training;DME Instruction;Therapeutic exercise;Balance training;Patient/family education;Manual techniques   PT Next Visit Plan Try electrical stimulation for back pain; if beneficial, discuss with patient getting TENS unit.  Do Berg balance test for baseline measurement.  Begin LE strengthening,  closed chain and/or with body weight resistance; work on balance and gait, including stairs.  Consider adjusting walker height further.   Consulted and Agree with Plan of Care Patient      Patient will benefit from skilled therapeutic intervention in order to improve the following deficits and impairments:  Abnormal gait, Decreased activity tolerance, Decreased balance, Decreased strength, Pain  Visit Diagnosis: Difficulty in walking, not elsewhere classified - Plan: PT plan of care cert/re-cert  Chronic bilateral low back pain without sciatica - Plan: PT plan of care cert/re-cert  Abnormal posture - Plan: PT plan of care cert/re-cert  Muscle weakness (generalized) - Plan: PT plan of care cert/re-cert      G-Codes - 95/18/84 Dec 02, 2054    Functional Assessment Tool Used (Outpatient Only) clinical judgement   Functional Limitation Mobility: Walking and moving around   Mobility: Walking and Moving Around Current Status (Z6606) At least 60 percent but less than 80 percent impaired, limited or restricted   Mobility: Walking and Moving Around Goal Status [id number]) At least 40 percent but less than 60 percent impaired, limited or restricted       Problem List Patient Active Problem List   Diagnosis Date Noted  . Multiple myeloma without remission ([place])   . B12 deficiency   . Lytic bone lesions on xray   . Anemia   . Hypercalcemia 10/02/2016  . Thrombocytopenia ([place]) 10/02/2016  . Low back pain 10/02/2016  . AKI (acute kidney injury) ([place])   . Acute congestive heart failure ([place])   . Bone mass   . CAD S/P percutaneous coronary angioplasty 07/31/2016  . DOE (  dyspnea on exertion)   . Essential hypertension 07/18/2016  . Hyperlipidemia 07/18/2016  . Diabetes ([place]) 07/18/2016  . Chronic atrial fibrillation ([place]) 07/18/2016  . Current use of long term anticoagulation 07/18/2016  . Dyspnea on exertion 07/18/2016  . Cardiomyopathy, ischemic: EF ~25 % by LV Gram 07/18/2016  . Abnormal  nuclear stress test 07/18/2016    SALISBURY,DONNA 03/11/2017, 9:08 PM  [place], [place], [address] Phone: [phone]   Fax:  [phone]  Name: NAKHI CHOI MRN: [id number] Date of Birth: 1946-08-25  Serafina Royals, PT 03/11/17 9:09 PM

## 2017-03-12 ENCOUNTER — Ambulatory Visit: Payer: Medicare HMO | Admitting: Physical Therapy

## 2017-03-12 DIAGNOSIS — G8929 Other chronic pain: Secondary | ICD-10-CM

## 2017-03-12 DIAGNOSIS — M545 Low back pain, unspecified: Secondary | ICD-10-CM

## 2017-03-12 DIAGNOSIS — M6281 Muscle weakness (generalized): Secondary | ICD-10-CM

## 2017-03-12 DIAGNOSIS — R262 Difficulty in walking, not elsewhere classified: Secondary | ICD-10-CM | POA: Diagnosis not present

## 2017-03-12 DIAGNOSIS — R293 Abnormal posture: Secondary | ICD-10-CM

## 2017-03-12 NOTE — Telephone Encounter (Signed)
Oral Chemotherapy Pharmacist Encounter  I called [facility] at [phone] to follow-up on status of patient's Revlimid since speaking with patient on 5/22. Per pharmacy, patient has not called them. They have left patient another voicemail this morning.  I called and LVM for patient with [employer]'s phone number and request for patient to call pharmacy. Financial counselors will help patient identify copayment support at that time.  I also left number to [facility] for patient to call with questions.  Oral oncology Clinic will continue to follow.  [name], PharmD, BCPS, BCOP 03/12/2017  9:36 AM Oral Oncology Clinic [phone]

## 2017-03-13 NOTE — Therapy (Signed)
[place], [place], [address] Phone: [phone]   Fax:  [phone]  Physical Therapy Treatment  Patient Details  Name: Richard Vang MRN: [id number] Date of Birth: 01-18-1946 Referring Provider: Dr. Sullivan Lone  Encounter Date: 03/12/2017      PT End of Session - 03/13/17 0721    Visit Number 2   Number of Visits 14   Date for PT Re-Evaluation 05/17/17   PT Start Time 1430   PT Stop Time 1515   PT Time Calculation (min) 45 min   Activity Tolerance Patient tolerated treatment well   Behavior During Therapy [facility] for tasks assessed/performed      Past Medical History:  Diagnosis Date  . Arthritis    "left shoulder" (07/31/2016)  . Coronary artery disease   . GERD (gastroesophageal reflux disease)   . Heart murmur   . High cholesterol   . Hypertension   . Multiple myeloma ([place])   . Sleep apnea    "probably; having test in November" (07/31/2016)  . Type II diabetes mellitus ([place])     Past Surgical History:  Procedure Laterality Date  . CARDIAC CATHETERIZATION N/A 07/31/2016   Procedure: Left Heart Cath and Coronary Angiography;  Surgeon: Lorretta Harp, MD;  Location: [facility];  Service: Cardiovascular;  Laterality: N/A;  . CARDIAC CATHETERIZATION N/A 07/31/2016   Procedure: Coronary Stent Intervention;  Surgeon: Lorretta Harp, MD;  Location: [facility];  Service: Cardiovascular;  Laterality: N/A;  . CORONARY ANGIOPLASTY    . SHOULDER SURGERY Left 1973   "put pin in it where it had separated"   . TONSILLECTOMY  ~ 1956    There were no vitals filed for this visit.      Subjective Assessment - 03/12/17 1451    Subjective My back is hurting. Pt says he cannot walk well if his back is hurting.  He states he lost 104# since December    Pertinent History Light chain Multiple myeloma with Lytic lesion with aggressive features in the right iliac bone and possibly other  lytic lesions in the L spine , anemia, hypercalcemia and renal insuff (diagnosed in 09/2016)  Not on active treatment now, but trying a drug approved for maintenance.  hypertension, diabetes, dyslipidemia, coronary artery disease.    Patient Stated Goals to walk without falling, go up and down the steps to walk in the yard; get my back to quit hurting   Currently in Pain? Yes   Pain Score 8    Pain Location Back   Pain Orientation Lower   Pain Descriptors / Indicators Aching   Pain Type Chronic pain   Pain Onset More than a month ago   Pain Frequency Intermittent   Pain Relieving Factors pain meds                          [facility] Adult PT Treatment/Exercise - 03/12/17 0001      Lumbar Exercises: Sidelying   Clam 10 reps   Clam Limitations both sides      Shoulder Exercises: Sidelying   External Rotation AROM;10 reps   ABduction AROM;10 reps  to 90    Other Sidelying Exercises small circles 10 reps in each direction with hand pointed to ceiling      Modalities   Modalities Electrical Stimulation     Electrical Stimulation   Electrical Stimulation Location low back   PT in  room throughout session    Electrical Stimulation Action premod    Electrical Stimulation Parameters 2 channel with intensity of 35    Electrical Stimulation Goals Pain                   [facility] Goals - 03/11/17 2100      CC Short Term Goal  #1   Title Patient will be independent with initial home exercise program   Time 4   Period Weeks   Status New     CC Short Term Goal  #2   Title Pt. will demonstrate erect posture when walking in his walker for at least 50 feet.             [facility] Goals - 03/11/17 2049      CC Long Term Goal  #1   Title Pt. will be able to stand from sitting without having to push off with his arms, indicating improved LE strength and mobility.   Time 8   Period Weeks   Status New     CC Long Term Goal  #2   Title Pt.  will be able to negotiate three steps independently, safely, so that he can get in and out of his house on his own.   Time 8   Period Weeks   Status New     CC Long Term Goal  #3   Title Pt. will be able to walk 400 feet without needing to rest.   Time 8   Period Weeks     CC Long Term Goal  #4   Title If Berg balance score is below 46 initiallly, score will increase by at least 8 points, indicating improved safety with mobility.   Time 8   Period Weeks   Status New            Plan - 03/13/17 0762    Clinical Impression Statement Pt reports he got some relief from back pain with the electric stimulation and reports feeling better when he left than when he came in.  He did well with intial exercise session and expect he will get stronger with exercise    Rehab Potential Good   PT Frequency 2x / week   PT Duration 4 weeks   PT Next Visit Plan Apply TENS unit with introduction to it for patient prior to exercise session and see if it will allow him to perform exercises easier,  If still beneficial, consider issuing TENS unit for home next session  continue with exercise.  Do BERG for baseline measure   Consulted and Agree with Plan of Care Patient      Patient will benefit from skilled therapeutic intervention in order to improve the following deficits and impairments:  Abnormal gait, Decreased activity tolerance, Decreased balance, Decreased strength, Pain  Visit Diagnosis: Difficulty in walking, not elsewhere classified  Chronic bilateral low back pain without sciatica  Abnormal posture  Muscle weakness (generalized)     Problem List Patient Active Problem List   Diagnosis Date Noted  . Multiple myeloma without remission ([place])   . B12 deficiency   . Lytic bone lesions on xray   . Anemia   . Hypercalcemia 10/02/2016  . Thrombocytopenia ([place]) 10/02/2016  . Low back pain 10/02/2016  . AKI (acute kidney injury) ([place])   . Acute congestive heart failure ([place])   .  Bone mass   . CAD S/P percutaneous coronary angioplasty 07/31/2016  . DOE (  dyspnea on exertion)   . Essential hypertension 07/18/2016  . Hyperlipidemia 07/18/2016  . Diabetes ([place]) 07/18/2016  . Chronic atrial fibrillation ([place]) 07/18/2016  . Current use of long term anticoagulation 07/18/2016  . Dyspnea on exertion 07/18/2016  . Cardiomyopathy, ischemic: EF ~25 % by LV Gram 07/18/2016  . Abnormal nuclear stress test 07/18/2016   Donato Heinz. Owens Shark PT  Norwood Levo 03/13/2017, 7:26 AM  [place], [place], [address] Phone: [phone]   Fax:  [phone]  Name: Richard Vang MRN: [id number] Date of Birth: 1946-09-10

## 2017-03-18 ENCOUNTER — Ambulatory Visit: Payer: Medicare HMO

## 2017-03-18 DIAGNOSIS — R293 Abnormal posture: Secondary | ICD-10-CM

## 2017-03-18 DIAGNOSIS — M6281 Muscle weakness (generalized): Secondary | ICD-10-CM

## 2017-03-18 DIAGNOSIS — R262 Difficulty in walking, not elsewhere classified: Secondary | ICD-10-CM

## 2017-03-18 DIAGNOSIS — M545 Low back pain: Secondary | ICD-10-CM

## 2017-03-18 DIAGNOSIS — G8929 Other chronic pain: Secondary | ICD-10-CM

## 2017-03-18 NOTE — Therapy (Signed)
[place], [place], [address] Phone: [phone]   Fax:  [phone]  Physical Therapy Treatment  Patient Details  Name: Richard Vang MRN: [id number] Date of Birth: 25-Mar-1946 Referring Provider: Dr. Sullivan Lone  Encounter Date: 03/18/2017      PT End of Session - 03/18/17 1600    Visit Number 3   Number of Visits 14   Date for PT Re-Evaluation 05/17/17   PT Start Time 1520   PT Stop Time 1603   PT Time Calculation (min) 43 min   Activity Tolerance Patient tolerated treatment well   Behavior During Therapy [facility] for tasks assessed/performed      Past Medical History:  Diagnosis Date  . Arthritis    "left shoulder" (07/31/2016)  . Coronary artery disease   . GERD (gastroesophageal reflux disease)   . Heart murmur   . High cholesterol   . Hypertension   . Multiple myeloma ([place])   . Sleep apnea    "probably; having test in November" (07/31/2016)  . Type II diabetes mellitus ([place])     Past Surgical History:  Procedure Laterality Date  . CARDIAC CATHETERIZATION N/A 07/31/2016   Procedure: Left Heart Cath and Coronary Angiography;  Surgeon: Lorretta Harp, MD;  Location: [place] [facility];  Service: Cardiovascular;  Laterality: N/A;  . CARDIAC CATHETERIZATION N/A 07/31/2016   Procedure: Coronary Stent Intervention;  Surgeon: Lorretta Harp, MD;  Location: [place] [facility];  Service: Cardiovascular;  Laterality: N/A;  . CORONARY ANGIOPLASTY    . SHOULDER SURGERY Left 1973   "put pin in it where it had separated"   . TONSILLECTOMY  ~ 1956    There were no vitals filed for this visit.      Subjective Assessment - 03/18/17 1532    Subjective My back felt okay after Ileft here but then I had to sit in the car for about an hour while my wife ran an errand and it was hurting again before it was hurting again.    Pertinent History Light chain Multiple myeloma with Lytic lesion with  aggressive features in the right iliac bone and possibly other lytic lesions in the L spine , anemia, hypercalcemia and renal insuff (diagnosed in 09/2016)  Not on active treatment now, but trying a drug approved for maintenance.  hypertension, diabetes, dyslipidemia, coronary artery disease.    Patient Stated Goals to walk without falling, go up and down the steps to walk in the yard; get my back to quit hurting   Currently in Pain? Yes   Pain Score 9    Pain Location Back   Pain Orientation Lower   Pain Descriptors / Indicators Sharp   Pain Type Chronic pain   Pain Onset More than a month ago   Pain Frequency Intermittent   Aggravating Factors  later in the day it gets the worse it hurts   Pain Relieving Factors pain meds, the eletrical stimulation helped some last time                         [facility] Adult PT Treatment/Exercise - 03/18/17 0001      Lumbar Exercises: Aerobic   Stationary Bike Level 1 x 3 minutes, mild SOB after  Did not have increased LBP after     Lumbar Exercises: Supine   Ab Set Other (comment)  Pelvic tilt 3 x 10 with tactile cuing throughout   Clam  5 reps  2 sets with pelvic tilt   Bent Knee Raise 5 reps  2 sets with pelvic tilt   Bridge 5 reps  2 sets     Shoulder Exercises: Supine   Horizontal ABduction Strengthening;Both;10 reps;Theraband   Theraband Level (Shoulder Horizontal ABduction) Level 1 (Yellow)   External Rotation Strengthening;Both;10 reps;Theraband   Theraband Level (Shoulder External Rotation) Level 1 (Yellow)   Flexion Strengthening;Both;10 reps;Theraband  Narrow Grip     Shoulder Exercises: Standing   Flexion Strengthening;Both;10 reps;Weights  Standing with back against wall   Shoulder Flexion Weight (lbs) 1   Flexion Limitations Tried scaption but pt with shoulder pain and Rt scapular compensation so stopped     Acupuncturist Stimulation Location low back    Electrical Stimulation Action  Burst II with TENS unit worn for duration of session   Electrical Stimulation Parameters 2 channels up to 21 output   Electrical Stimulation Goals Pain                   [facility] Term Clinic Goals - 03/11/17 2100      CC Short Term Goal  #1   Title Patient will be independent with initial home exercise program   Time 4   Period Weeks   Status New     CC Short Term Goal  #2   Title Pt. will demonstrate erect posture when walking in his walker for at least 50 feet.             [facility] Goals - 03/11/17 2049      CC Long Term Goal  #1   Title Pt. will be able to stand from sitting without having to push off with his arms, indicating improved LE strength and mobility.   Time 8   Period Weeks   Status New     CC Long Term Goal  #2   Title Pt. will be able to negotiate three steps independently, safely, so that he can get in and out of his house on his own.   Time 8   Period Weeks   Status New     CC Long Term Goal  #3   Title Pt. will be able to walk 400 feet without needing to rest.   Time 8   Period Weeks     CC Long Term Goal  #4   Title If Berg balance score is below 46 initiallly, score will increase by at least 8 points, indicating improved safety with mobility.   Time 8   Period Weeks   Status New            Plan - 03/18/17 1604    Clinical Impression Statement Pt continued with relief after session today with use of TENS unit during exercises. He did well with postural and core strengthening exercises and was able to walk more upright after session.  Educated him about where he could purchase TENS unit if he continues to deem beneficial and he verbalized understanding this and was going to look into it.    Rehab Potential Good   Clinical Impairments Affecting Rehab Potential no weighted or resisted exercises due to nature of disease   PT Frequency 2x / week   PT Duration 4 weeks   PT Treatment/Interventions ADLs/Self Care Home  Management;Electrical Stimulation;Moist Heat;Gait training;DME Instruction;Therapeutic exercise;Balance training;Patient/family education;Manual techniques   PT Next Visit Plan cont with TENS unit prior to exercise session and see if it  will allow him to perform exercises easier, continue with exercise. Do BERG for baseline measure (did not do today as pt reported haivng more LBP than usual) Cont with core and postural strength (NO WEIGHTS OR RESISTANCE)   Consulted and Agree with Plan of Care Patient      Patient will benefit from skilled therapeutic intervention in order to improve the following deficits and impairments:  Abnormal gait, Decreased activity tolerance, Decreased balance, Decreased strength, Pain  Visit Diagnosis: Difficulty in walking, not elsewhere classified  Chronic bilateral low back pain without sciatica  Abnormal posture  Muscle weakness (generalized)     Problem List Patient Active Problem List   Diagnosis Date Noted  . Multiple myeloma without remission ([place])   . B12 deficiency   . Lytic bone lesions on xray   . Anemia   . Hypercalcemia 10/02/2016  . Thrombocytopenia ([place]) 10/02/2016  . Low back pain 10/02/2016  . AKI (acute kidney injury) ([place])   . Acute congestive heart failure ([place])   . Bone mass   . CAD S/P percutaneous coronary angioplasty 07/31/2016  . DOE (dyspnea on exertion)   . Essential hypertension 07/18/2016  . Hyperlipidemia 07/18/2016  . Diabetes ([place]) 07/18/2016  . Chronic atrial fibrillation ([place]) 07/18/2016  . Current use of long term anticoagulation 07/18/2016  . Dyspnea on exertion 07/18/2016  . Cardiomyopathy, ischemic: EF ~25 % by LV Gram 07/18/2016  . Abnormal nuclear stress test 07/18/2016    Otelia Limes, PTA 03/18/2017, 5:12 PM  [place], [place], [address] Phone: [phone]   Fax:  [phone]  Name: MUAZ SHOREY MRN:  [id number] Date of Birth: 1945/11/05

## 2017-03-19 ENCOUNTER — Ambulatory Visit: Payer: Medicare HMO | Admitting: Physical Therapy

## 2017-03-19 DIAGNOSIS — R293 Abnormal posture: Secondary | ICD-10-CM

## 2017-03-19 DIAGNOSIS — M545 Low back pain: Secondary | ICD-10-CM

## 2017-03-19 DIAGNOSIS — R262 Difficulty in walking, not elsewhere classified: Secondary | ICD-10-CM | POA: Diagnosis not present

## 2017-03-19 DIAGNOSIS — M6281 Muscle weakness (generalized): Secondary | ICD-10-CM

## 2017-03-19 DIAGNOSIS — G8929 Other chronic pain: Secondary | ICD-10-CM

## 2017-03-19 NOTE — Therapy (Addendum)
[place], [place], [address] Phone: [phone]   Fax:  [phone]  Physical Therapy Treatment  Patient Details  Name: Richard Vang MRN: [id number] Date of Birth: [birth year] Referring Provider: Dr. Sullivan Lone  Encounter Date: 03/19/2017      PT End of Session - 03/19/17 1707    Visit Number 4   Number of Visits 14   Date for PT Re-Evaluation 05/17/17   PT Start Time 0932   PT Stop Time 1609   PT Time Calculation (min) 48 min   Activity Tolerance Patient tolerated treatment well   Behavior During Therapy [facility] for tasks assessed/performed      Past Medical History:  Diagnosis Date  . Arthritis    "left shoulder" (07/31/2016)  . Coronary artery disease   . GERD (gastroesophageal reflux disease)   . Heart murmur   . High cholesterol   . Hypertension   . Multiple myeloma ([place])   . Sleep apnea    "probably; having test in November" (07/31/2016)  . Type II diabetes mellitus ([place])     Past Surgical History:  Procedure Laterality Date  . CARDIAC CATHETERIZATION N/A 07/31/2016   Procedure: Left Heart Cath and Coronary Angiography;  Surgeon: Lorretta Harp, MD;  Location: [place] [facility] [facility];  Service: Cardiovascular;  Laterality: N/A;  . CARDIAC CATHETERIZATION N/A 07/31/2016   Procedure: Coronary Stent Intervention;  Surgeon: Lorretta Harp, MD;  Location: [place] [facility];  Service: Cardiovascular;  Laterality: N/A;  . CORONARY ANGIOPLASTY    . SHOULDER SURGERY Left 1973   "put pin in it where it had separated"   . TONSILLECTOMY  ~ 1956    There were no vitals filed for this visit.      Subjective Assessment - 03/19/17 1523    Subjective Back and leg (left) are hurting today.  Hasn't been to [facility] yet to get a TENS unit.  Things are still going about the same as they were.  Not having problems with the exercise.   Currently in Pain? Yes   Pain Score 9    Pain Location Back   Pain Orientation Left   Pain Descriptors / Indicators Sharp   Pain Radiating Towards left posterior thigh   Aggravating Factors  sitting at edge of bed   Pain Relieving Factors The electrical stimulation helped the first time, but by the time I got home it was already hurting again.                         [place] Adult PT Treatment/Exercise - 03/19/17 0001      Lumbar Exercises: Supine   Ab Set 10 reps   Clam 10 reps  with abdominal set   Bent Knee Raise 10 reps  2 sets with pelvic tilt     Knee/Hip Exercises: Standing   Hip Abduction AROM;Right;Left;10 reps;Knee straight;2 sets  2 hands on counter for each side, then 1 hand on counter   Lateral Step Up Right;Left;10 reps;Hand Hold: 2;Step Height: 4"   SLS each foot with opposite hand on countertop, hold 30 seconds x 2 each side     Knee/Hip Exercises: Seated   Sit to Sand 10 reps;with UE support;2 sets  hands on thighs; 23 inch high mat elevation; mild dyspnea     Shoulder Exercises: Standing   Other Standing Exercises counter edge push-ups x 10     Electrical Stimulation   Electrical  Stimulation Location low back, one channel superior and one inferior to painful area   Electrical Stimulation Action Burst II  worn during exercise today   Electrical Stimulation Parameters 2 channels, intensity in 20s   Electrical Stimulation Goals Pain     Ankle Exercises: Standing   Heel Raises 20 reps;1 second  hands on walker handles for support, 2 sets                   Short Term Clinic Goals - 03/11/17 2100      CC Short Term Goal  #1   Title Patient will be independent with initial home exercise program   Time 4   Period Weeks   Status New     CC Short Term Goal  #2   Title Pt. will demonstrate erect posture when walking in his walker for at least 50 feet.             [facility] Goals - 03/11/17 2049      CC Long Term Goal  #1   Title Pt. will be able to stand from sitting without  having to push off with his arms, indicating improved LE strength and mobility.   Time 8   Period Weeks   Status New     CC Long Term Goal  #2   Title Pt. will be able to negotiate three steps independently, safely, so that he can get in and out of his house on his own.   Time 8   Period Weeks   Status New     CC Long Term Goal  #3   Title Pt. will be able to walk 400 feet without needing to rest.   Time 8   Period Weeks     CC Long Term Goal  #4   Title If Berg balance score is below 46 initiallly, score will increase by at least 8 points, indicating improved safety with mobility.   Time 8   Period Weeks   Status New            Plan - 03/19/17 1707    Clinical Impression Statement Patient does well with exercise.  Some of what he did today he considered easy, but some moderate difficulty, and by end of session, he was tired.  He tolerated it well despite a high level of pain today.  He is unsure how much TENS helps, but may look for one at [facility].   Rehab Potential Good   Clinical Impairments Affecting Rehab Potential no weighted or resisted exercises due to nature of disease   PT Frequency 2x / week   PT Duration 4 weeks   PT Treatment/Interventions ADLs/Self Care Home Management;Electrical Stimulation;Moist Heat;Gait training;DME Instruction;Therapeutic exercise;Balance training;Patient/family education;Manual techniques   PT Next Visit Plan cont with TENS unit prior to exercise session and see if it will allow him to perform exercises easier, continue with exercise. Do BERG for baseline measure (did not do today as pt reported haivng more LBP than usual) Cont with core and postural strength (NO WEIGHTS OR RESISTANCE)   Consulted and Agree with Plan of Care Patient      Patient will benefit from skilled therapeutic intervention in order to improve the following deficits and impairments:  Abnormal gait, Decreased activity tolerance, Decreased balance, Decreased strength,  Pain  Visit Diagnosis: Difficulty in walking, not elsewhere classified  Chronic bilateral low back pain without sciatica  Abnormal posture  Muscle weakness (generalized)  Problem List Patient Active Problem List   Diagnosis Date Noted  . Multiple myeloma without remission ([place])   . B12 deficiency   . Lytic bone lesions on xray   . Anemia   . Hypercalcemia 10/02/2016  . Thrombocytopenia ([place]) 10/02/2016  . Low back pain 10/02/2016  . AKI (acute kidney injury) ([place])   . Acute congestive heart failure ([place])   . Bone mass   . CAD S/P percutaneous coronary angioplasty 07/31/2016  . DOE (dyspnea on exertion)   . Essential hypertension 07/18/2016  . Hyperlipidemia 07/18/2016  . Diabetes ([place]) 07/18/2016  . Chronic atrial fibrillation ([place]) 07/18/2016  . Current use of long term anticoagulation 07/18/2016  . Dyspnea on exertion 07/18/2016  . Cardiomyopathy, ischemic: EF ~25 % by LV Gram 07/18/2016  . Abnormal nuclear stress test 07/18/2016    Yuriel Lopezmartinez 03/19/2017, 5:09 PM  [place], [place], [address] Phone: [phone]   Fax:  [phone]  Name: DAUNDRE BIEL MRN: [id number] Date of Birth: [birth year]  Serafina Royals, PT 03/19/17 5:09 PM  Serafina Royals, PT 03/19/17 8:54 PM

## 2017-03-19 NOTE — Telephone Encounter (Signed)
Oral Chemotherapy Pharmacist Encounter  Received voicemail from [name] that [name] authorization number for Revlimid expires today and they have not been successful in getting tin touch with the patient to schedule delivery despite multiple attempts.  Oral Oncology Clinic has also left multiple voicemails for patient regarding this matter.  I called patient, mailbox was full.  I then called [name], significant other, at work in an attempt to reach patient. [name] stated they had not received any of the viocemails left by me or the pharmacy.  I urged [name] to call [employer] at [phone], pick option 1, so that the Revlimid can be processed today. [name] stated she would make sure they spoke with the pharmacy today and also provided alternate contact numbers. Home: [phone] Granddaughter [name]): [phone]  I then called [employer] and delivered the above information and phone numbers.  Oral Oncology Clinic will continue to follow.  [name], PharmD, BCPS, BCOP 03/19/2017  1:38 PM Oral Oncology Clinic [phone]

## 2017-03-24 ENCOUNTER — Ambulatory Visit: Payer: Medicare HMO | Attending: Hematology | Admitting: Physical Therapy

## 2017-03-24 DIAGNOSIS — M6281 Muscle weakness (generalized): Secondary | ICD-10-CM | POA: Diagnosis present

## 2017-03-24 DIAGNOSIS — M545 Low back pain: Secondary | ICD-10-CM | POA: Insufficient documentation

## 2017-03-24 DIAGNOSIS — R262 Difficulty in walking, not elsewhere classified: Secondary | ICD-10-CM | POA: Diagnosis present

## 2017-03-24 DIAGNOSIS — R293 Abnormal posture: Secondary | ICD-10-CM | POA: Diagnosis present

## 2017-03-24 DIAGNOSIS — G8929 Other chronic pain: Secondary | ICD-10-CM | POA: Insufficient documentation

## 2017-03-24 NOTE — Therapy (Signed)
[place], [place], [address] Phone: [phone]   Fax:  [phone]  Physical Therapy Treatment  Patient Details  Name: Richard Vang MRN: [id number] Date of Birth: 1946-06-08 Referring Provider: Dr. Sullivan Lone  Encounter Date: 03/24/2017      PT End of Session - 03/24/17 1703    Visit Number 5   Number of Visits 14   Date for PT Re-Evaluation 05/17/17   PT Start Time 6553   PT Stop Time 1603   PT Time Calculation (min) 48 min   Activity Tolerance Patient tolerated treatment well   Behavior During Therapy [facility] for tasks assessed/performed      Past Medical History:  Diagnosis Date  . Arthritis    "left shoulder" (07/31/2016)  . Coronary artery disease   . GERD (gastroesophageal reflux disease)   . Heart murmur   . High cholesterol   . Hypertension   . Multiple myeloma ([place])   . Sleep apnea    "probably; having test in November" (07/31/2016)  . Type II diabetes mellitus ([place])     Past Surgical History:  Procedure Laterality Date  . CARDIAC CATHETERIZATION N/A 07/31/2016   Procedure: Left Heart Cath and Coronary Angiography;  Surgeon: Lorretta Harp, MD;  Location: [place] [facility];  Service: Cardiovascular;  Laterality: N/A;  . CARDIAC CATHETERIZATION N/A 07/31/2016   Procedure: Coronary Stent Intervention;  Surgeon: Lorretta Harp, MD;  Location: [place] [facility];  Service: Cardiovascular;  Laterality: N/A;  . CORONARY ANGIOPLASTY    . SHOULDER SURGERY Left 1973   "put pin in it where it had separated"   . TONSILLECTOMY  ~ 1956    There were no vitals filed for this visit.      Subjective Assessment - 03/24/17 1516    Subjective Everything's pretty much the same.  Planning to go to [facility] today to look at a TENS unit.   Currently in Pain? Yes   Pain Score 7    Pain Location Back   Pain Orientation Lower   Pain Descriptors / Indicators Aching   Aggravating Factors   not really anything   Pain Relieving Factors electrical stim helps for a little while, but doesn't last very long; maybe a half hour after it's off            [facility] PT Assessment - 03/24/17 0001      Assessment   Medical Diagnosis       Standardized Balance Assessment   Standardized Balance Assessment Berg Balance Test     Berg Balance Test   Sit to Stand Needs minimal aid to stand or to stabilize   Standing Unsupported Able to stand safely 2 minutes   Sitting with Back Unsupported but Feet Supported on Floor or Stool Able to sit safely and securely 2 minutes   Stand to Sit Sits safely with minimal use of hands   Transfers Able to transfer safely, minor use of hands   Standing Unsupported with Eyes Closed Able to stand 10 seconds safely   Standing Ubsupported with Feet Together Able to place feet together independently and stand 1 minute safely   From Standing, Reach Forward with Outstretched Arm Can reach forward >12 cm safely (5")   From Standing Position, Pick up Object from Floor Able to pick up shoe, needs supervision   From Standing Position, Turn to Look Behind Over each Shoulder Turn sideways only but maintains balance  a little wobbly,  and back is hurting   Turn 360 Degrees Able to turn 360 degrees safely but slowly   Standing Unsupported, Alternately Place Feet on Step/Stool Needs assistance to keep from falling or unable to try   Standing Unsupported, One Foot in Front Needs help to step but can hold 15 seconds  back hurting   Standing on One Leg Tries to lift leg/unable to hold 3 seconds but remains standing independently   Total Score 37   Berg comment: testing made his back hurt, even with TENS unit on while he did this                     [facility] Adult PT Treatment/Exercise - 03/24/17 0001      Lumbar Exercises: Supine   Clam 10 reps  with abdominal set   Heel Slides 10 reps  not touching heel to mat; 1 rep is Rt + Lt   Bent Knee Raise 10 reps  1  sets with pelvic tilt   Other Supine Lumbar Exercises hooklying lower trunk rotation, good control, 10 x each way     Knee/Hip Exercises: Standing   Hip Abduction AROM;Right;Left;2 sets;10 reps;Knee straight  1-2 hand support on countertop   SLS each foot with opposite hand on countertop, hold 30 seconds x 2 each side     Electrical Stimulation   Electrical Stimulation Location low back, one channel superior and one inferior to painful area   Electrical Stimulation Action Modulated 1   Electrical Stimulation Parameters 2 channels, intensity in 30s   Electrical Stimulation Goals Pain  he thinks modulated does more than Burst                   Short Term Clinic Goals - 03/11/17 2100      CC Short Term Goal  #1   Title Patient will be independent with initial home exercise program   Time 4   Period Weeks   Status New     CC Short Term Goal  #2   Title Pt. will demonstrate erect posture when walking in his walker for at least 50 feet.             [facility] Goals - 03/11/17 2049      CC Long Term Goal  #1   Title Pt. will be able to stand from sitting without having to push off with his arms, indicating improved LE strength and mobility.   Time 8   Period Weeks   Status New     CC Long Term Goal  #2   Title Pt. will be able to negotiate three steps independently, safely, so that he can get in and out of his house on his own.   Time 8   Period Weeks   Status New     CC Long Term Goal  #3   Title Pt. will be able to walk 400 feet without needing to rest.   Time 8   Period Weeks     CC Long Term Goal  #4   Title If Berg balance score is below 46 initiallly, score will increase by at least 8 points, indicating improved safety with mobility.   Time 8   Period Weeks   Status New            Plan - 03/24/17 1703    Clinical Impression Statement Pt. scored 37/56 on Berg balance test.  He reported the most difficulty with trying to  lift his foot  up in standing (for the touching the step activity). For some of the other exercises, he felt he could be challenged more than he was today.  The TENS unit seems to be of some benefit, though back pain was a limiting factor in the Berg balance test, even with TENS on.   Rehab Potential Good   Clinical Impairments Affecting Rehab Potential no weighted or resisted exercises due to nature of disease   PT Frequency 2x / week   PT Duration 4 weeks   PT Treatment/Interventions ADLs/Self Care Home Management;Electrical Stimulation;Moist Heat;Gait training;DME Instruction;Therapeutic exercise;Balance training;Patient/family education;Manual techniques   PT Next Visit Plan Use TENS unit if pt. feels it's beneficial; if he gets his own unit, help make sure he knows how to use it.  Work on [employer]. Continue LE strengthening and balance exercises as well as core strengthening.  Challenge him more than with the exercises he did today.   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 Multiple myeloma without remission ([place])   . B12 deficiency   . Lytic bone lesions on xray   . Anemia   . Hypercalcemia 10/02/2016  . Thrombocytopenia ([place]) 10/02/2016  . Low back pain 10/02/2016  . AKI (acute kidney injury) ([place])   . Acute congestive heart failure ([place])   . Bone mass   . CAD S/P percutaneous coronary angioplasty 07/31/2016  . DOE (dyspnea on exertion)   . Essential hypertension 07/18/2016  . Hyperlipidemia 07/18/2016  . Diabetes ([place]) 07/18/2016  .  Chronic atrial fibrillation ([place]) 07/18/2016  . Current use of long term anticoagulation 07/18/2016  . Dyspnea on exertion 07/18/2016  . Cardiomyopathy, ischemic: EF ~25 % by LV Gram 07/18/2016  . Abnormal nuclear stress test 07/18/2016    Delynda Sepulveda 03/24/2017, 5:09 PM  [place], [place], [address] Phone: [phone]   Fax:  [phone]  Name: DELLA HOMAN MRN: [id number] Date of Birth: [birth year]  Serafina Royals, PT 03/24/17 5:09 PM

## 2017-03-25 ENCOUNTER — Ambulatory Visit: Payer: Medicare HMO | Admitting: Physical Therapy

## 2017-03-25 DIAGNOSIS — R262 Difficulty in walking, not elsewhere classified: Secondary | ICD-10-CM | POA: Diagnosis not present

## 2017-03-25 DIAGNOSIS — M6281 Muscle weakness (generalized): Secondary | ICD-10-CM

## 2017-03-25 DIAGNOSIS — M545 Low back pain: Secondary | ICD-10-CM

## 2017-03-25 DIAGNOSIS — R293 Abnormal posture: Secondary | ICD-10-CM

## 2017-03-25 DIAGNOSIS — G8929 Other chronic pain: Secondary | ICD-10-CM

## 2017-03-25 NOTE — Patient Instructions (Signed)
SHOULDER: Flexion - Supine (Cane)        Cancer Rehab [phone]  DO THE FOLLOWING ONCE A DAY. GRADUALLY, OVER TIME, TRY TO INCREASE THE NUMBER OF REPETITIONS OF EACH TO 20 (except heel raise--for those, increase the number of sets).    Hold cane in both hands. Raise arms up overhead. Do not allow back to arch. Hold _5__ seconds. Do __5-10__ times; __1__ times a day.     Side Pull: Double Arm   On back, knees bent, feet flat. Arms perpendicular to body, shoulder level, elbows straight but relaxed. Pull arms out to sides, elbows straight. Resistance band comes across collarbones, hands toward floor. Hold momentarily. Slowly return to starting position. Repeat _10__ times. Band color _yellow____   Sword   On back, knees bent, feet flat, left hand on left hip, right hand above left. Pull right arm DIAGONALLY (hip to shoulder) across chest. Bring right arm along head toward floor. Hold momentarily. Slowly return to starting position. Repeat _10__ times. Do with left arm. Band color _yellow_____   Shoulder Rotation: Double Arm   On back, knees bent, feet flat, elbows tucked at sides, bent 90, hands palms up. Pull hands apart and down toward floor, keeping elbows near sides. Hold momentarily. Slowly return to starting position. Repeat _10__ times. Band color __yellow____    LYING ON YOUR BACK WITH KNEES BENT: Tighten abdominal muscles, pulling belly button towards spine.  While holding this, straighten one leg out, keeping your foot just above the bed.  Bend that leg back again and then straighten the other in the same way.  Relax your abdominal muscles after doing this, and that equals 1 repetition.  Remember not to hold your breath. Do 10 repetitions.  (Increase to 20 gradually.)   Bracing With Bridging (Hook-Lying)    With neutral spine, tighten pelvic floor and abdominals and hold. Lift bottom. While holding your hips up high, spread your knees apart and then together. Repeat  _10__ times. Do _1__ time a day.   Copyright  VHI. All rights reserved.    Chair Push-Up    With hands pushing down on armrests, lean forward and try to lift buttocks and stand up all the way. Use your hands only as much as you need to.  Return to sitting position, again trying not to lean too heavily on your hands, but controlling your descent with your leg strength. Repeat _10___ times. Do __1__ sessions per day.  http://gt2.exer.us/465   Copyright  VHI. All rights reserved.    Heel Raise: Bilateral (Standing)    Stand behind the back of a chair or at a countertop with your hands on that surface to help steady you.  Rise on balls of feet. Repeat _20___ times per set. Do _2___ sets per session. Do __1__ sessions per day.  http://orth.exer.us/38   Copyright  VHI. All rights reserved.

## 2017-03-25 NOTE — Therapy (Signed)
[place], [place], [address] Phone: [phone]   Fax:  [phone]  Physical Therapy Treatment  Patient Details  Name: Richard Vang MRN: [id number] Date of Birth: December 05, 1945 Referring Provider: Dr. Sullivan Lone  Encounter Date: 03/25/2017      PT End of Session - 03/25/17 1654    Visit Number 6   Number of Visits 14   Date for PT Re-Evaluation 05/17/17   PT Start Time 1600   PT Stop Time 1649   PT Time Calculation (min) 49 min   Activity Tolerance Patient tolerated treatment well   Behavior During Therapy [facility] for tasks assessed/performed      Past Medical History:  Diagnosis Date  . Arthritis    "left shoulder" (07/31/2016)  . Coronary artery disease   . GERD (gastroesophageal reflux disease)   . Heart murmur   . High cholesterol   . Hypertension   . Multiple myeloma ([place])   . Sleep apnea    "probably; having test in November" (07/31/2016)  . Type II diabetes mellitus ([place])     Past Surgical History:  Procedure Laterality Date  . CARDIAC CATHETERIZATION N/A 07/31/2016   Procedure: Left Heart Cath and Coronary Angiography;  Surgeon: Lorretta Harp, MD;  Location: [facility];  Service: Cardiovascular;  Laterality: N/A;  . CARDIAC CATHETERIZATION N/A 07/31/2016   Procedure: Coronary Stent Intervention;  Surgeon: Lorretta Harp, MD;  Location: [facility];  Service: Cardiovascular;  Laterality: N/A;  . CORONARY ANGIOPLASTY    . SHOULDER SURGERY Left 1973   "put pin in it where it had separated"   . TONSILLECTOMY  ~ 1956    There were no vitals filed for this visit.      Subjective Assessment - 03/25/17 1604    Subjective This morning I could hardly move my legs because it hurt to try to move; I guess from doing so much yesterday.  Pain pill helped. Went to [employer] and they were sold out of TENS units; we're going to go back.   Currently in Pain? Yes   Pain Score  6    Pain Location Back   Pain Orientation Lower                         [facility] Adult PT Treatment/Exercise - 03/25/17 0001      Ambulation/Gait   Gait Comments With rollator, had patient walk 50 feet and hold himself as erect as possible; then lowered rollator handles a couple of inches and had him try it that way.  Patient is going to try using the rollator at this height for a few days.     Exercises   Exercises Ankle     Lumbar Exercises: Supine   Heel Slides 10 reps  not touching heel to mat; 1 rep is Rt + Lt   Bridge Non-compliant;10 reps;1 second  with knees apart/together movement     Knee/Hip Exercises: Seated   Sit to Sand 10 reps;with UE support  from chair with arms, trying to use arms little     Shoulder Exercises: Supine   Protraction AROM;Both;10 reps  holding dowel   Horizontal ABduction Strengthening;Both;10 reps;Theraband   Theraband Level (Shoulder Horizontal ABduction) Level 1 (Yellow)   External Rotation Strengthening;Both;10 reps;Theraband   Theraband Level (Shoulder External Rotation) Level 1 (Yellow)   Flexion AAROM;Both;10 reps  with dowel   Other Supine Exercises active D2  vs. yellow Theraband x 10 reps right, then left     Ankle Exercises: Standing   Heel Raises 20 reps;1 second  2 sets; hands on back of chair                PT Education - 03/25/17 1654    Education provided Yes   Education Details initial home exercise program as per pt. instructions just above   Person(s) Educated Patient   Methods Explanation;Verbal cues;Handout   Comprehension Verbalized understanding;Returned demonstration           Short Term Clinic Goals - 03/25/17 1656      CC Short Term Goal  #1   Title Patient will be independent with initial home exercise program   Status Partially Met     CC Short Term Goal  #2   Title Pt. will demonstrate erect posture when walking in his walker for at least 50 feet.   Baseline He can do this  when cued and when focused on it.   Status Achieved             Long Term Clinic Goals - 03/11/17 2049      CC Long Term Goal  #1   Title Pt. will be able to stand from sitting without having to push off with his arms, indicating improved LE strength and mobility.   Time 8   Period Weeks   Status New     CC Long Term Goal  #2   Title Pt. will be able to negotiate three steps independently, safely, so that he can get in and out of his house on his own.   Time 8   Period Weeks   Status New     CC Long Term Goal  #3   Title Pt. will be able to walk 400 feet without needing to rest.   Time 8   Period Weeks     CC Long Term Goal  #4   Title If Berg balance score is below 46 initiallly, score will increase by at least 8 points, indicating improved safety with mobility.   Time 8   Period Weeks   Status New            Plan - 03/25/17 1655    Clinical Impression Statement Initiated formal home exercise program today, giving patient handout with instructions and having him perform all of these in clinic today.  Back pain didn't bother him much during this.   Rehab Potential Good   Clinical Impairments Affecting Rehab Potential no weighted or resisted exercises due to nature of disease   PT Frequency 2x / week   PT Duration 4 weeks   PT Treatment/Interventions ADLs/Self Care Home Management;Electrical Stimulation;Moist Heat;Gait training;DME Instruction;Therapeutic exercise;Balance training;Patient/family education;Manual techniques   PT Next Visit Plan Use TENS unit if pt. feels it's beneficial; if he gets his own unit, help make sure he knows how to use it.  Review HEP if needed. Continue LE strengthening and balance exercises as well as core strengthening.  Challenge him more than with the exercises he has done recently.   PT Home Exercise Plan see instruction section   Consulted and Agree with Plan of Care Patient      Patient will benefit from skilled therapeutic  intervention in order to improve the following deficits and impairments:  Abnormal gait, Decreased activity tolerance, Decreased balance, Decreased strength, Pain  Visit Diagnosis: Difficulty in walking, not elsewhere classified  Chronic bilateral low back pain  without sciatica  Abnormal posture  Muscle weakness (generalized)     Problem List Patient Active Problem List   Diagnosis Date Noted  . Multiple myeloma without remission ([place])   . B12 deficiency   . Lytic bone lesions on xray   . Anemia   . Hypercalcemia 10/02/2016  . Thrombocytopenia ([place]) 10/02/2016  . Low back pain 10/02/2016  . AKI (acute kidney injury) ([place])   . Acute congestive heart failure ([place])   . Bone mass   . CAD S/P percutaneous coronary angioplasty 07/31/2016  . DOE (dyspnea on exertion)   . Essential hypertension 07/18/2016  . Hyperlipidemia 07/18/2016  . Diabetes ([place]) 07/18/2016  . Chronic atrial fibrillation ([place]) 07/18/2016  . Current use of long term anticoagulation 07/18/2016  . Dyspnea on exertion 07/18/2016  . Cardiomyopathy, ischemic: EF ~25 % by LV Gram 07/18/2016  . Abnormal nuclear stress test 07/18/2016    SALISBURY,DONNA 03/25/2017, 4:57 PM  [place], [place], [address] Phone: [phone]   Fax:  [phone]  Name: Richard Vang MRN: [id number] Date of Birth: [birth year]  Serafina Royals, PT 03/25/17 4:57 PM

## 2017-03-31 ENCOUNTER — Encounter: Payer: Self-pay | Admitting: Physical Therapy

## 2017-03-31 ENCOUNTER — Ambulatory Visit: Payer: Medicare HMO | Admitting: Physical Therapy

## 2017-03-31 DIAGNOSIS — R262 Difficulty in walking, not elsewhere classified: Secondary | ICD-10-CM | POA: Diagnosis not present

## 2017-03-31 DIAGNOSIS — M545 Low back pain: Secondary | ICD-10-CM

## 2017-03-31 DIAGNOSIS — R293 Abnormal posture: Secondary | ICD-10-CM

## 2017-03-31 DIAGNOSIS — M6281 Muscle weakness (generalized): Secondary | ICD-10-CM

## 2017-03-31 DIAGNOSIS — G8929 Other chronic pain: Secondary | ICD-10-CM

## 2017-03-31 NOTE — Therapy (Signed)
[place], [place], [address] Phone: [phone]   Fax:  [phone]  Physical Therapy Treatment  Patient Details  Name: Richard Vang MRN: [id number] Date of Birth: Feb 24, 1946 Referring Provider: Dr. Sullivan Lone  Encounter Date: 03/31/2017      PT End of Session - 03/31/17 1723    Visit Number 7   Number of Visits 14   Date for PT Re-Evaluation 05/17/17   PT Start Time 2446   PT Stop Time 1602   PT Time Calculation (min) 45 min   Activity Tolerance Patient tolerated treatment well   Behavior During Therapy [facility] for tasks assessed/performed      Past Medical History:  Diagnosis Date  . Arthritis    "left shoulder" (07/31/2016)  . Coronary artery disease   . GERD (gastroesophageal reflux disease)   . Heart murmur   . High cholesterol   . Hypertension   . Multiple myeloma ([place])   . Sleep apnea    "probably; having test in November" (07/31/2016)  . Type II diabetes mellitus ([place])     Past Surgical History:  Procedure Laterality Date  . CARDIAC CATHETERIZATION N/A 07/31/2016   Procedure: Left Heart Cath and Coronary Angiography;  Surgeon: Lorretta Harp, MD;  Location: [facility];  Service: Cardiovascular;  Laterality: N/A;  . CARDIAC CATHETERIZATION N/A 07/31/2016   Procedure: Coronary Stent Intervention;  Surgeon: Lorretta Harp, MD;  Location: [facility];  Service: Cardiovascular;  Laterality: N/A;  . CORONARY ANGIOPLASTY    . SHOULDER SURGERY Left 1973   "put pin in it where it had separated"   . TONSILLECTOMY  ~ 1956    There were no vitals filed for this visit.      Subjective Assessment - 03/31/17 1519    Subjective I had some low back pain after last session but it always hurts. I have been doing some of my home exercise program every day. I will probably get a TENS unit but I am not sure it helps that much.    Pertinent History Light chain Multiple myeloma  with Lytic lesion with aggressive features in the right iliac bone and possibly other lytic lesions in the L spine , anemia, hypercalcemia and renal insuff (diagnosed in 09/2016)  Not on active treatment now, but trying a drug approved for maintenance.  hypertension, diabetes, dyslipidemia, coronary artery disease.    Patient Stated Goals to walk without falling, go up and down the steps to walk in the yard; get my back to quit hurting   Currently in Pain? No/denies   Pain Score 0-No pain                         [facility] Adult PT Treatment/Exercise - 03/31/17 0001      Ambulation/Gait   Gait Comments with rollator x 112f x 2 with consistent cues for upright posture and facing forward     High Level Balance   High Level Balance Activities Braiding;Backward walking;Tandem walking  in parallel bars x 2 times each, with use of 1-2 HHA on rail     Knee/Hip Exercises: Standing   Knee Flexion Strengthening;Both;1 set;10 reps   Hip Flexion Stengthening;Both;1 set;10 reps;Knee bent   Hip ADduction Strengthening;Both;1 set;10 reps   Hip Extension Stengthening;Both;1 set;10 reps;Knee straight   Forward Step Up Right;Left;1 set;5 sets;Hand Hold: 1;Step Height: 4"  5x bilaterally     Shoulder Exercises:  Standing   Other Standing Exercises attempted raising therapy ball while standing but stopped due to left shoulder crepitus                   Short Term Clinic Goals - 03/25/17 1656      CC Short Term Goal  #1   Title Patient will be independent with initial home exercise program   Status Partially Met     CC Short Term Goal  #2   Title Pt. will demonstrate erect posture when walking in his walker for at least 50 feet.   Baseline He can do this when cued and when focused on it.   Status Achieved             Long Term Clinic Goals - 03/11/17 2049      CC Long Term Goal  #1   Title Pt. will be able to stand from sitting without having to push off with his  arms, indicating improved LE strength and mobility.   Time 8   Period Weeks   Status New     CC Long Term Goal  #2   Title Pt. will be able to negotiate three steps independently, safely, so that he can get in and out of his house on his own.   Time 8   Period Weeks   Status New     CC Long Term Goal  #3   Title Pt. will be able to walk 400 feet without needing to rest.   Time 8   Period Weeks     CC Long Term Goal  #4   Title If Berg balance score is below 46 initiallly, score will increase by at least 8 points, indicating improved safety with mobility.   Time 8   Period Weeks   Status New            Plan - 03/31/17 1723    Clinical Impression Statement Began higher level balance activities today in parallel bars. Patient required several seated rest periods but was able to complete all with 1 - 2 hand hold assist. He had the most difficult time with braiding in parallel bars. Then worked on standing hip strengthening exercises with no added resistance x 10 reps each. Pt required seated recovery periods in between exercises. He did complain of back pain but was not limited by pain. Pt needs frequent cueing for upright posture.    Rehab Potential Good   Clinical Impairments Affecting Rehab Potential no weighted or resisted exercises due to nature of disease   PT Frequency 2x / week   PT Duration 4 weeks   PT Treatment/Interventions ADLs/Self Care Home Management;Electrical Stimulation;Moist Heat;Gait training;DME Instruction;Therapeutic exercise;Balance training;Patient/family education;Manual techniques   PT Next Visit Plan Use TENS unit if pt. feels it's beneficial; if he gets his own unit, help make sure he knows how to use it.  Review HEP if needed. Continue LE strengthening and balance exercises as well as core strengthening.  Continue higher level balance exercises and work on decreasing hand hold assist   PT Home Exercise Plan see instruction section   Consulted and Agree  with Plan of Care Patient      Patient will benefit from skilled therapeutic intervention in order to improve the following deficits and impairments:  Abnormal gait, Decreased activity tolerance, Decreased balance, Decreased strength, Pain  Visit Diagnosis: Difficulty in walking, not elsewhere classified  Chronic bilateral low back pain without sciatica  Abnormal posture  Muscle  weakness (generalized)     Problem List Patient Active Problem List   Diagnosis Date Noted  . Multiple myeloma without remission ([place])   . B12 deficiency   . Lytic bone lesions on xray   . Anemia   . Hypercalcemia 10/02/2016  . Thrombocytopenia ([place]) 10/02/2016  . Low back pain 10/02/2016  . AKI (acute kidney injury) ([place])   . Acute congestive heart failure ([place])   . Bone mass   . CAD S/P percutaneous coronary angioplasty 07/31/2016  . DOE (dyspnea on exertion)   . Essential hypertension 07/18/2016  . Hyperlipidemia 07/18/2016  . Diabetes ([place]) 07/18/2016  . Chronic atrial fibrillation ([place]) 07/18/2016  . Current use of long term anticoagulation 07/18/2016  . Dyspnea on exertion 07/18/2016  . Cardiomyopathy, ischemic: EF ~25 % by LV Gram 07/18/2016  . Abnormal nuclear stress test 07/18/2016    Allyson Sabal [facility] 03/31/2017, 5:29 PM  [place], [place], [address] Phone: [phone]   Fax:  [phone]  Name: IHAN PAT MRN: [id number] Date of Birth: May 17, 1946  Manus Gunning, PT 03/31/17 5:29 PM

## 2017-04-02 ENCOUNTER — Encounter: Payer: Self-pay | Admitting: *Deleted

## 2017-04-02 ENCOUNTER — Other Ambulatory Visit (HOSPITAL_BASED_OUTPATIENT_CLINIC_OR_DEPARTMENT_OTHER): Payer: Medicare HMO

## 2017-04-02 ENCOUNTER — Telehealth: Payer: Self-pay | Admitting: Hematology

## 2017-04-02 ENCOUNTER — Ambulatory Visit: Payer: Medicare HMO | Admitting: Physical Therapy

## 2017-04-02 ENCOUNTER — Ambulatory Visit (HOSPITAL_BASED_OUTPATIENT_CLINIC_OR_DEPARTMENT_OTHER): Payer: Medicare HMO | Admitting: Hematology

## 2017-04-02 ENCOUNTER — Encounter: Payer: Self-pay | Admitting: Hematology

## 2017-04-02 VITALS — BP 103/56 | HR 61 | Temp 97.8°F | Resp 17 | Ht 70.0 in | Wt 162.9 lb

## 2017-04-02 DIAGNOSIS — E538 Deficiency of other specified B group vitamins: Secondary | ICD-10-CM

## 2017-04-02 DIAGNOSIS — R262 Difficulty in walking, not elsewhere classified: Secondary | ICD-10-CM

## 2017-04-02 DIAGNOSIS — R293 Abnormal posture: Secondary | ICD-10-CM

## 2017-04-02 DIAGNOSIS — M6281 Muscle weakness (generalized): Secondary | ICD-10-CM

## 2017-04-02 DIAGNOSIS — C9000 Multiple myeloma not having achieved remission: Secondary | ICD-10-CM

## 2017-04-02 DIAGNOSIS — M545 Low back pain: Secondary | ICD-10-CM

## 2017-04-02 DIAGNOSIS — G8929 Other chronic pain: Secondary | ICD-10-CM

## 2017-04-02 LAB — COMPREHENSIVE METABOLIC PANEL
ALBUMIN: 3.4 g/dL — AB (ref 3.5–5.0)
ALT: 134 U/L — ABNORMAL HIGH (ref 0–55)
AST: 129 U/L — ABNORMAL HIGH (ref 5–34)
Alkaline Phosphatase: 166 U/L — ABNORMAL HIGH (ref 40–150)
Anion Gap: 7 mEq/L (ref 3–11)
BUN: 27.6 mg/dL — ABNORMAL HIGH (ref 7.0–26.0)
CO2: 26 meq/L (ref 22–29)
Calcium: 9.4 mg/dL (ref 8.4–10.4)
Chloride: 110 mEq/L — ABNORMAL HIGH (ref 98–109)
Creatinine: 1.2 mg/dL (ref 0.7–1.3)
EGFR: 59 mL/min/{1.73_m2} — AB (ref >90)
GLUCOSE: 103 mg/dL (ref 70–140)
POTASSIUM: 4 meq/L (ref 3.5–5.1)
SODIUM: 143 meq/L (ref 136–145)
TOTAL PROTEIN: 6.1 g/dL — AB (ref 6.4–8.3)
Total Bilirubin: 0.76 mg/dL (ref 0.20–1.20)

## 2017-04-02 LAB — CBC & DIFF AND RETIC
BASO%: 1 % (ref 0.0–2.0)
BASOS ABS: 0.1 10*3/uL (ref 0.0–0.1)
EOS ABS: 0.1 10*3/uL (ref 0.0–0.5)
EOS%: 2.2 % (ref 0.0–7.0)
HCT: 37.4 % — ABNORMAL LOW (ref 38.4–49.9)
HEMOGLOBIN: 12.3 g/dL — AB (ref 13.0–17.1)
IMMATURE RETIC FRACT: 2.3 % — AB (ref 3.00–10.60)
LYMPH#: 0.6 10*3/uL — AB (ref 0.9–3.3)
LYMPH%: 11.8 % — ABNORMAL LOW (ref 14.0–49.0)
MCH: 31 pg (ref 27.2–33.4)
MCHC: 32.9 g/dL (ref 32.0–36.0)
MCV: 94.2 fL (ref 79.3–98.0)
MONO#: 0.4 10*3/uL (ref 0.1–0.9)
MONO%: 8 % (ref 0.0–14.0)
NEUT%: 77 % — ABNORMAL HIGH (ref 39.0–75.0)
NEUTROS ABS: 3.8 10*3/uL (ref 1.5–6.5)
NRBC: 0 % (ref 0–0)
Platelets: 147 10*3/uL (ref 140–400)
RBC: 3.97 10*6/uL — AB (ref 4.20–5.82)
RDW: 15 % — AB (ref 11.0–14.6)
RETIC %: 1.02 % (ref 0.80–1.80)
Retic Ct Abs: 40.49 10*3/uL (ref 34.80–93.90)
WBC: 5 10*3/uL (ref 4.0–10.3)

## 2017-04-02 MED ORDER — ONDANSETRON HCL 8 MG PO TABS
8.0000 mg | ORAL_TABLET | Freq: Two times a day (BID) | ORAL | 1 refills | Status: DC | PRN
Start: 2017-04-02 — End: 2018-03-04

## 2017-04-02 NOTE — Therapy (Signed)
[place], [place], [address] Phone: [phone]   Fax:  [phone]  Physical Therapy Treatment  Patient Details  Name: Richard Vang MRN: [id number] Date of Birth: [birth year] Referring Provider: Dr. Sullivan Lone  Encounter Date: 04/02/2017      PT End of Session - 04/02/17 1712    Visit Number 8   Number of Visits 14   Date for PT Re-Evaluation 05/17/17   PT Start Time 9563   PT Stop Time 1600   PT Time Calculation (min) 45 min   Activity Tolerance Patient tolerated treatment well   Behavior During Therapy [facility] for tasks assessed/performed      Past Medical History:  Diagnosis Date  . Arthritis    "left shoulder" (07/31/2016)  . Coronary artery disease   . GERD (gastroesophageal reflux disease)   . Heart murmur   . High cholesterol   . Hypertension   . Multiple myeloma ([place])   . Sleep apnea    "probably; having test in November" (07/31/2016)  . Type II diabetes mellitus ([place])     Past Surgical History:  Procedure Laterality Date  . CARDIAC CATHETERIZATION N/A 07/31/2016   Procedure: Left Heart Cath and Coronary Angiography;  Surgeon: Lorretta Harp, MD;  Location: [facility];  Service: Cardiovascular;  Laterality: N/A;  . CARDIAC CATHETERIZATION N/A 07/31/2016   Procedure: Coronary Stent Intervention;  Surgeon: Lorretta Harp, MD;  Location: [facility];  Service: Cardiovascular;  Laterality: N/A;  . CORONARY ANGIOPLASTY    . SHOULDER SURGERY Left 1973   "put pin in it where it had separated"   . TONSILLECTOMY  ~ 1956    There were no vitals filed for this visit.      Subjective Assessment - 04/02/17 1528    Subjective Pt has MD appointment today and had to do alot of walking in the hospital.  He does not think the TENS unit helps, it just takes his mind off  the pain    Pertinent History Light chain Multiple myeloma with Lytic lesion with aggressive features  in the right iliac bone and possibly other lytic lesions in the L spine , anemia, hypercalcemia and renal insuff (diagnosed in 09/2016)  Not on active treatment now, but trying a drug approved for maintenance.  hypertension, diabetes, dyslipidemia, coronary artery disease.    Patient Stated Goals to walk without falling, go up and down the steps to walk in the yard; get my back to quit hurting   Currently in Pain? Yes   Pain Score 8    Pain Location Back   Pain Orientation Lower                         [facility] Adult PT Treatment/Exercise - 04/02/17 0001      Exercises   Exercises Elbow     Elbow Exercises   Elbow Flexion Strengthening;Both;20 reps;Supine;Bar weights/barbell   Bar Weights/Barbell (Elbow Flexion) 2 lbs   Elbow Extension Strengthening;Right;Left;10 reps;Supine;Bar weights/barbell   Bar Weights/Barbell (Elbow Extension) 2 lbs     Lumbar Exercises: Supine   Heel Slides 10 reps   Heel Slides Limitations 5 pounds on ankle. assist to keep heel from catching on mat      Lumbar Exercises: Sidelying   Clam 5 reps   Hip Abduction 5 reps     Knee/Hip Exercises: Supine   Short Arc Quad Sets Strengthening;Right;Left;2 sets;10 reps  5 # cuff weights   Hip Adduction Isometric Both;1 set;10 reps   Other Supine Knee/Hip Exercises abduction with red theraband around distal thighs x 2 sets x10 reps      Shoulder Exercises: Supine   Flexion Strengthening;Right;Left;5 reps;Weights   Flexion Limitations no weight on left arm 2# on right arm, only raised arm about 30 degrees from mat    Other Supine Exercises small circles with 2# with hand pointed to ceiling.      Shoulder Exercises: Sidelying   ABduction AROM;Right;Left;Both                   Short Term Clinic Goals - 03/25/17 1656      CC Short Term Goal  #1   Title Patient will be independent with initial home exercise program   Status Partially Met     CC Short Term Goal  #2   Title Pt. will  demonstrate erect posture when walking in his walker for at least 50 feet.   Baseline He can do this when cued and when focused on it.   Status Achieved             Long Term Clinic Goals - 03/11/17 2049      CC Long Term Goal  #1   Title Pt. will be able to stand from sitting without having to push off with his arms, indicating improved LE strength and mobility.   Time 8   Period Weeks   Status New     CC Long Term Goal  #2   Title Pt. will be able to negotiate three steps independently, safely, so that he can get in and out of his house on his own.   Time 8   Period Weeks   Status New     CC Long Term Goal  #3   Title Pt. will be able to walk 400 feet without needing to rest.   Time 8   Period Weeks     CC Long Term Goal  #4   Title If Berg balance score is below 46 initiallly, score will increase by at least 8 points, indicating improved safety with mobility.   Time 8   Period Weeks   Status New            Plan - 04/02/17 1713    Clinical Impression Statement Pt was fatigued today due to earlier MD appointment but he was able to do all exercises on the mat with good effort and no required rest breaks.  Pt states he knows the PT is helping him as he can walk short distances in his home without the rolling walker    Rehab Potential Good   PT Frequency 2x / week   PT Duration 4 weeks   PT Treatment/Interventions ADLs/Self Care Home Management;Electrical Stimulation;Moist Heat;Gait training;DME Instruction;Therapeutic exercise;Balance training;Patient/family education;Manual techniques   PT Next Visit Plan Assess activity tolerance .  Review HEP if needed. Continue LE strengthening and balance exercises as well as core strengthening.  Continue higher level balance exercises and work on decreasing hand hold assist   Consulted and Agree with Plan of Care Patient      Patient will benefit from skilled therapeutic intervention in order to improve the following deficits  and impairments:  Abnormal gait, Decreased activity tolerance, Decreased balance, Decreased strength, Pain  Visit Diagnosis: Difficulty in walking, not elsewhere classified  Chronic bilateral low back pain without sciatica  Abnormal posture  Muscle weakness (generalized)  Problem List Patient Active Problem List   Diagnosis Date Noted  . Multiple myeloma without remission ([place])   . B12 deficiency   . Lytic bone lesions on xray   . Anemia   . Hypercalcemia 10/02/2016  . Thrombocytopenia ([place]) 10/02/2016  . Low back pain 10/02/2016  . AKI (acute kidney injury) ([place])   . Acute congestive heart failure ([place])   . Bone mass   . CAD S/P percutaneous coronary angioplasty 07/31/2016  . DOE (dyspnea on exertion)   . Essential hypertension 07/18/2016  . Hyperlipidemia 07/18/2016  . Diabetes ([place]) 07/18/2016  . Chronic atrial fibrillation ([place]) 07/18/2016  . Current use of long term anticoagulation 07/18/2016  . Dyspnea on exertion 07/18/2016  . Cardiomyopathy, ischemic: EF ~25 % by LV Gram 07/18/2016  . Abnormal nuclear stress test 07/18/2016   Donato Heinz. Owens Shark PT  Norwood Levo 04/02/2017, 5:16 PM  [place], [place], [address] Phone: [phone]   Fax:  [phone]  Name: Richard Vang MRN: [id number] Date of Birth: [birth year]

## 2017-04-02 NOTE — Patient Instructions (Signed)
Thank you for choosing [facility] to provide your oncology and hematology care.  To afford each patient quality time with our providers, please arrive 30 minutes before your scheduled appointment time.  If you arrive late for your appointment, you may be asked to reschedule.  We strive to give you quality time with our providers, and arriving late affects you and other patients whose appointments are after yours.  If you are a no show for multiple scheduled visits, you may be dismissed from the clinic at the providers discretion.   Again, thank you for choosing [facility], our hope is that these requests will decrease the amount of time that you wait before being seen by our physicians.  ______________________________________________________________________ Should you have questions after your visit to the [facility], please contact our office at [phone] between the hours of 8:30 and 4:30 p.m.    Voicemails left after 4:30p.m will not be returned until the following business day.   For prescription refill requests, please have your pharmacy contact us directly.  Please also try to allow 48 hours for prescription requests.   Please contact the scheduling department for questions regarding scheduling.  For scheduling of procedures such as PET scans, CT scans, MRI, Ultrasound, etc please contact central scheduling at [phone].   Resources For Cancer Patients and Caregivers:  American Cancer Society:  800-227-2345  Can help patients locate various types of support and financial assistance Cancer Care: 1-800-813-HOPE (4673) Provides financial assistance, online support groups, medication/co-pay assistance.   [place] DSS:  336-641-3447 Where to apply for food stamps, Medicaid, and utility assistance Medicare Rights Center: 800-333-4114 Helps people with Medicare understand their rights and benefits, navigate the Medicare system, and secure the  quality healthcare they deserve SCAT: 336-333-6589 [employer] [employer]'s shared-ride transportation service for eligible riders who have a disability that prevents them from riding the fixed route bus.   For additional information on assistance programs please contact our social worker:   Grier Hock/Abigail Elmore:  [phone] 

## 2017-04-02 NOTE — Telephone Encounter (Signed)
Scheduled appt per 6/14 los - Gave patient AVS and calender.  

## 2017-04-03 ENCOUNTER — Other Ambulatory Visit: Payer: Self-pay

## 2017-04-03 ENCOUNTER — Other Ambulatory Visit: Payer: Self-pay | Admitting: *Deleted

## 2017-04-03 ENCOUNTER — Telehealth: Payer: Self-pay

## 2017-04-03 DIAGNOSIS — C9000 Multiple myeloma not having achieved remission: Secondary | ICD-10-CM

## 2017-04-03 LAB — KAPPA/LAMBDA LIGHT CHAINS
Ig Kappa Free Light Chain: 37 mg/L — ABNORMAL HIGH (ref 3.3–19.4)
Ig Lambda Free Light Chain: 20.5 mg/L (ref 5.7–26.3)
Kappa/Lambda FluidC Ratio: 1.8 — ABNORMAL HIGH (ref 0.26–1.65)

## 2017-04-03 MED ORDER — LENALIDOMIDE 10 MG PO CAPS: ORAL_CAPSULE | ORAL | 0 refills | Status: DC

## 2017-04-03 MED ORDER — LENALIDOMIDE 10 MG PO CAPS
ORAL_CAPSULE | ORAL | 0 refills | Status: DC
Start: 2017-04-03 — End: 2017-04-23

## 2017-04-03 NOTE — Telephone Encounter (Signed)
Telephone encounter.

## 2017-04-06 ENCOUNTER — Ambulatory Visit: Payer: Medicare HMO | Admitting: Physical Therapy

## 2017-04-08 ENCOUNTER — Ambulatory Visit: Payer: Medicare HMO | Admitting: Physical Therapy

## 2017-04-09 ENCOUNTER — Ambulatory Visit: Payer: Medicare HMO

## 2017-04-09 DIAGNOSIS — M6281 Muscle weakness (generalized): Secondary | ICD-10-CM

## 2017-04-09 DIAGNOSIS — M545 Low back pain: Secondary | ICD-10-CM

## 2017-04-09 DIAGNOSIS — G8929 Other chronic pain: Secondary | ICD-10-CM

## 2017-04-09 DIAGNOSIS — R262 Difficulty in walking, not elsewhere classified: Secondary | ICD-10-CM

## 2017-04-09 DIAGNOSIS — R293 Abnormal posture: Secondary | ICD-10-CM

## 2017-04-09 NOTE — Therapy (Signed)
[place], [place], [address] Phone: [phone]   Fax:  [phone]  Physical Therapy Treatment  Patient Details  Name: Richard Vang MRN: [id number] Date of Birth: [birth year] Referring Provider: Dr. Sullivan Lone  Encounter Date: 04/09/2017      PT End of Session - 04/09/17 0936    Visit Number 9   Number of Visits 14   Date for PT Re-Evaluation 05/17/17   PT Start Time 0853   PT Stop Time 0936   PT Time Calculation (min) 43 min   Activity Tolerance Patient tolerated treatment well   Behavior During Therapy [facility] for tasks assessed/performed      Past Medical History:  Diagnosis Date  . Arthritis    "left shoulder" (07/31/2016)  . Coronary artery disease   . GERD (gastroesophageal reflux disease)   . Heart murmur   . High cholesterol   . Hypertension   . Multiple myeloma ([place])   . Sleep apnea    "probably; having test in November" (07/31/2016)  . Type II diabetes mellitus ([place])     Past Surgical History:  Procedure Laterality Date  . CARDIAC CATHETERIZATION N/A 07/31/2016   Procedure: Left Heart Cath and Coronary Angiography;  Surgeon: Lorretta Harp, MD;  Location: [facility];  Service: Cardiovascular;  Laterality: N/A;  . CARDIAC CATHETERIZATION N/A 07/31/2016   Procedure: Coronary Stent Intervention;  Surgeon: Lorretta Harp, MD;  Location: [facility] [facility];  Service: Cardiovascular;  Laterality: N/A;  . CORONARY ANGIOPLASTY    . SHOULDER SURGERY Left 1973   "put pin in it where it had separated"   . TONSILLECTOMY  ~ 1956    There were no vitals filed for this visit.      Subjective Assessment - 04/09/17 0857    Subjective Started new chemo med and it's just been keeping me nauseous. My back was hurting and I had to lay down about 2 hours after my last appointment but I really don't think it was anything we did here. It's just my back acting up like always.    Pertinent History Light chain Multiple myeloma with Lytic lesion with aggressive features in the right iliac bone and possibly other lytic lesions in the L spine , anemia, hypercalcemia and renal insuff (diagnosed in 09/2016)  Not on active treatment now, but trying a drug approved for maintenance.  hypertension, diabetes, dyslipidemia, coronary artery disease.    Patient Stated Goals to walk without falling, go up and down the steps to walk in the yard; get my back to quit hurting   Currently in Pain? Yes   Pain Score 6    Pain Location Back   Pain Orientation Lower   Pain Descriptors / Indicators Aching   Pain Type Chronic pain   Pain Onset More than a month ago   Pain Frequency Intermittent   Aggravating Factors  it just comes and goes about every 8 hours   Pain Relieving Factors pain meds and laying down                         [facility] Adult PT Treatment/Exercise - 04/09/17 0001      Elbow Exercises   Elbow Flexion Strengthening;Both;20 reps;Seated  2 sets of 10 reps   Bar Weights/Barbell (Elbow Flexion) 2 lbs   Elbow Extension Strengthening;Right;Left;20 reps;Seated;Bar weights/barbell  Leaning on opposite thigh   Bar Weights/Barbell (Elbow Extension) 2 lbs  Lumbar Exercises: Supine   Clam 10 reps  With pelvic tilt   Heel Slides 10 reps  2 sets of 5 on each leg with pelvic tilt   Heel Slides Limitations partial ROM to allow pt to be able to hold pelvic tilt   Bridge 10 reps  2 sets of 5     Knee/Hip Exercises: Supine   Short Arc Quad Sets Strengthening;Right;Left;2 sets;10 reps  5 lbs each ankle with 3 second holds   Hip Adduction Isometric Both;20 reps  squeezing bolster   Straight Leg Raises Strengthening;Right;Left;2 sets;10 reps  2 lbs added to 2nd set   Straight Leg Raises Limitations Tactile and VC for correct technique (full knee ext with with quad set)   Other Supine Knee/Hip Exercises abduction with green theraband around distal thighs x 2 sets  x10 reps      Shoulder Exercises: Supine   Flexion Strengthening;Right;Left;Weights;10 reps  2 sets of 5    Shoulder Flexion Weight (lbs) 2   Flexion Limitations only able to raise arm to about 30 degrees due to weakness and has trouble keeping elbow extended   Other Supine Exercises small circles with 2# with hand pointed to ceiling 10 times each direction; chest press Rt, then Lt with min A on the Lt for full motion but this improved as he continued with repetitions                   Short Term Clinic Goals - 03/25/17 1656      CC Short Term Goal  #1   Title Patient will be independent with initial home exercise program   Status Partially Met     CC Short Term Goal  #2   Title Pt. will demonstrate erect posture when walking in his walker for at least 50 feet.   Baseline He can do this when cued and when focused on it.   Status Achieved             Long Term Clinic Goals - 03/11/17 2049      CC Long Term Goal  #1   Title Pt. will be able to stand from sitting without having to push off with his arms, indicating improved LE strength and mobility.   Time 8   Period Weeks   Status New     CC Long Term Goal  #2   Title Pt. will be able to negotiate three steps independently, safely, so that he can get in and out of his house on his own.   Time 8   Period Weeks   Status New     CC Long Term Goal  #3   Title Pt. will be able to walk 400 feet without needing to rest.   Time 8   Period Weeks     CC Long Term Goal  #4   Title If Berg balance score is below 46 initiallly, score will increase by at least 8 points, indicating improved safety with mobility.   Time 8   Period Weeks   Status New            Plan - 04/09/17 0936    Clinical Impression Statement Pt was feeling better today than he was at last visit with back pain being less so he was able to tolerate exercises very well. Still continued with supine exercises today since pt had increased LBP  after last visit. Added some core strength (bridges) that did not increase his pain during. After  session he reported no increased LBP and was pleased with how much he was able to do/tolerate today.    Rehab Potential Good   Clinical Impairments Affecting Rehab Potential no weighted or resisted exercises due to nature of disease   PT Frequency 2x / week   PT Duration 4 weeks   PT Treatment/Interventions ADLs/Self Care Home Management;Electrical Stimulation;Moist Heat;Gait training;DME Instruction;Therapeutic exercise;Balance training;Patient/family education;Manual techniques   PT Next Visit Plan Gcode next visit. Assess activity tolerance .  Review HEP if needed. Continue LE strengthening and balance exercises as well as core strengthening.  Continue higher level balance exercises and work on decreasing hand hold assist as pt tolerates.    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 Multiple myeloma without remission ([place])   . B12 deficiency   . Lytic bone lesions on xray   . Anemia   . Hypercalcemia 10/02/2016  . Thrombocytopenia ([place]) 10/02/2016  . Low back pain 10/02/2016  . AKI (acute kidney injury) ([place])   . Acute congestive heart failure ([place])   . Bone mass   . CAD S/P percutaneous coronary angioplasty 07/31/2016  . DOE (dyspnea on exertion)   . Essential hypertension 07/18/2016  . Hyperlipidemia 07/18/2016  . Diabetes ([place]) 07/18/2016  . Chronic atrial fibrillation ([place]) 07/18/2016  . Current use of long term  anticoagulation 07/18/2016  . Dyspnea on exertion 07/18/2016  . Cardiomyopathy, ischemic: EF ~25 % by LV Gram 07/18/2016  . Abnormal nuclear stress test 07/18/2016    Otelia Limes, PTA 04/09/2017, 9:59 AM  [place], [place], [address] Phone: [phone]   Fax:  [phone]  Name: MEHKAI GALLO MRN: [id number] Date of Birth: 12-26-1945

## 2017-04-13 ENCOUNTER — Telehealth: Payer: Self-pay

## 2017-04-13 NOTE — Telephone Encounter (Signed)
Wife called with concern about pt symptoms after starting revlimid. Pt stopped taking on Saturday (04/11/17) after increase in number of falls, dizziness, diarrhea, and decreased appetite. According to call this AM, pt feelings better and able to eat. Dr. Irene Limbo notified and does not want pt to start back on revlimid. F/u appt scheduled for 7/19. Dr. Irene Limbo will discuss other potential options with pt and wife then. Information shared with pt wife who verbalized agreement and understanding.

## 2017-04-14 ENCOUNTER — Ambulatory Visit: Payer: Medicare HMO | Admitting: Physical Therapy

## 2017-04-14 ENCOUNTER — Encounter: Payer: Self-pay | Admitting: Physical Therapy

## 2017-04-14 DIAGNOSIS — R262 Difficulty in walking, not elsewhere classified: Secondary | ICD-10-CM | POA: Diagnosis not present

## 2017-04-14 DIAGNOSIS — G8929 Other chronic pain: Secondary | ICD-10-CM

## 2017-04-14 DIAGNOSIS — R293 Abnormal posture: Secondary | ICD-10-CM

## 2017-04-14 DIAGNOSIS — M545 Low back pain: Secondary | ICD-10-CM

## 2017-04-14 DIAGNOSIS — M6281 Muscle weakness (generalized): Secondary | ICD-10-CM

## 2017-04-14 NOTE — Therapy (Signed)
[place], [place], [address] Phone: [phone]   Fax:  [phone]  Physical Therapy Treatment  Patient Details  Name: Richard Vang MRN: [id number] Date of Birth: 1946/01/29 Referring Provider: Dr. Sullivan Lone  Encounter Date: 04/14/2017      PT End of Session - 04/14/17 1548    Visit Number 10   Number of Visits 14   Date for PT Re-Evaluation 05/17/17   PT Start Time 7989   PT Stop Time 1520   PT Time Calculation (min) 41 min   Activity Tolerance Patient tolerated treatment well   Behavior During Therapy [facility] for tasks assessed/performed      Past Medical History:  Diagnosis Date  . Arthritis    "left shoulder" (07/31/2016)  . Coronary artery disease   . GERD (gastroesophageal reflux disease)   . Heart murmur   . High cholesterol   . Hypertension   . Multiple myeloma ([place])   . Sleep apnea    "probably; having test in November" (07/31/2016)  . Type II diabetes mellitus ([place])     Past Surgical History:  Procedure Laterality Date  . CARDIAC CATHETERIZATION N/A 07/31/2016   Procedure: Left Heart Cath and Coronary Angiography;  Surgeon: Lorretta Harp, MD;  Location: [place] [facility];  Service: Cardiovascular;  Laterality: N/A;  . CARDIAC CATHETERIZATION N/A 07/31/2016   Procedure: Coronary Stent Intervention;  Surgeon: Lorretta Harp, MD;  Location: [place] [facility];  Service: Cardiovascular;  Laterality: N/A;  . CORONARY ANGIOPLASTY    . SHOULDER SURGERY Left 1973   "put pin in it where it had separated"   . TONSILLECTOMY  ~ 1956    There were no vitals filed for this visit.      Subjective Assessment - 04/14/17 1440    Subjective I wasn't too sore after last time. I did okay but I started taking this new medication that made me sick.    Pertinent History Light chain Multiple myeloma with Lytic lesion with aggressive features in the right iliac bone and possibly other  lytic lesions in the L spine , anemia, hypercalcemia and renal insuff (diagnosed in 09/2016)  Not on active treatment now, but trying a drug approved for maintenance.  hypertension, diabetes, dyslipidemia, coronary artery disease.    Patient Stated Goals to walk without falling, go up and down the steps to walk in the yard; get my back to quit hurting   Currently in Pain? Yes   Pain Score 7    Pain Location Back   Pain Orientation Lower   Pain Descriptors / Indicators Aching   Pain Type Chronic pain   Pain Onset More than a month ago                         [facility] Adult PT Treatment/Exercise - 04/14/17 0001      Elbow Exercises   Elbow Flexion Strengthening;Both;20 reps;Seated  2 sets of 10 reps   Bar Weights/Barbell (Elbow Flexion) 2 lbs   Elbow Extension Strengthening;Right;Left;20 reps;Seated;Bar weights/barbell  Leaning on opposite thigh   Bar Weights/Barbell (Elbow Extension) 2 lbs     Lumbar Exercises: Supine   Clam 10 reps  With pelvic tilt   Heel Slides 10 reps  2 sets of 5 on each leg with pelvic tilt   Bridge 10 reps  2 sets of 5     Knee/Hip Exercises: Supine   Short Arc [employer]  Sets Strengthening;Right;Left;2 sets;10 reps  5 lbs each ankle with 3 second holds   Hip Adduction Isometric Both;20 reps  squeezing bolster   Straight Leg Raises Strengthening;Right;Left;10 reps;1 set   Other Supine Knee/Hip Exercises abduction with green theraband around distal thighs x 2 sets x10 reps                    Short Term Clinic Goals - 03/25/17 1656      CC Short Term Goal  #1   Title Patient will be independent with initial home exercise program   Status Partially Met     CC Short Term Goal  #2   Title Pt. will demonstrate erect posture when walking in his walker for at least 50 feet.   Baseline He can do this when cued and when focused on it.   Status Achieved             Long Term Clinic Goals - 03/11/17 2049      CC Long Term Goal   #1   Title Pt. will be able to stand from sitting without having to push off with his arms, indicating improved LE strength and mobility.   Time 8   Period Weeks   Status New     CC Long Term Goal  #2   Title Pt. will be able to negotiate three steps independently, safely, so that he can get in and out of his house on his own.   Time 8   Period Weeks   Status New     CC Long Term Goal  #3   Title Pt. will be able to walk 400 feet without needing to rest.   Time 8   Period Weeks     CC Long Term Goal  #4   Title If Berg balance score is below 46 initiallly, score will increase by at least 8 points, indicating improved safety with mobility.   Time 8   Period Weeks   Status New            Plan - 05/01/17 1548    Clinical Impression Statement Continued today with seated and supine exercises for strengthening with no increased complaints of back pain. Patient requires mod cueing to perform pelvic tilts and increase core strength. Next session will attempt to work more on standing balance exercises if pt's low back pain is not increased.    Rehab Potential Good   Clinical Impairments Affecting Rehab Potential no weighted or resisted exercises due to nature of disease   PT Frequency 2x / week   PT Duration 4 weeks   PT Treatment/Interventions ADLs/Self Care Home Management;Electrical Stimulation;Moist Heat;Gait training;DME Instruction;Therapeutic exercise;Balance training;Patient/family education;Manual techniques   PT Next Visit Plan Assess goals. Continue LE strengthening and balance exercises as well as core strengthening.  Continue higher level balance exercises and work on decreasing hand hold assist as pt tolerates.    Consulted and Agree with Plan of Care Patient      Patient will benefit from skilled therapeutic intervention in order to improve the following deficits and impairments:  Abnormal gait, Decreased activity tolerance, Decreased balance, Decreased strength,  Pain  Visit Diagnosis: Difficulty in walking, not elsewhere classified  Chronic bilateral low back pain without sciatica  Abnormal posture  Muscle weakness (generalized)       G-Codes - 05-01-17 1442    Functional Assessment Tool Used (Outpatient Only) clinical judgement   Functional Limitation Mobility: Walking and moving around  Mobility: Walking and Moving Around Current Status [phone]) At least 40 percent but less than 60 percent impaired, limited or restricted   Mobility: Walking and Moving Around Goal Status [phone]) At least 20 percent but less than 40 percent impaired, limited or restricted      Problem List Patient Active Problem List   Diagnosis Date Noted  . Multiple myeloma without remission ([place])   . B12 deficiency   . Lytic bone lesions on xray   . Anemia   . Hypercalcemia 10/02/2016  . Thrombocytopenia ([place]) 10/02/2016  . Low back pain 10/02/2016  . AKI (acute kidney injury) ([place])   . Acute congestive heart failure ([place])   . Bone mass   . CAD S/P percutaneous coronary angioplasty 07/31/2016  . DOE (dyspnea on exertion)   . Essential hypertension 07/18/2016  . Hyperlipidemia 07/18/2016  . Diabetes ([place]) 07/18/2016  . Chronic atrial fibrillation ([place]) 07/18/2016  . Current use of long term anticoagulation 07/18/2016  . Dyspnea on exertion 07/18/2016  . Cardiomyopathy, ischemic: EF ~25 % by LV Gram 07/18/2016  . Abnormal nuclear stress test 07/18/2016    Allyson Sabal [facility] 04/14/2017, 3:51 PM  [place], [place], [address] Phone: [phone]   Fax:  [phone]  Name: Richard Vang MRN: [id number] Date of Birth: 11-22-1945  Manus Gunning, PT 04/14/17 3:51 PM

## 2017-04-15 ENCOUNTER — Ambulatory Visit: Payer: Medicare HMO | Admitting: Physical Therapy

## 2017-04-15 ENCOUNTER — Encounter: Payer: Self-pay | Admitting: Physical Therapy

## 2017-04-15 DIAGNOSIS — G8929 Other chronic pain: Secondary | ICD-10-CM

## 2017-04-15 DIAGNOSIS — R293 Abnormal posture: Secondary | ICD-10-CM

## 2017-04-15 DIAGNOSIS — M545 Low back pain: Secondary | ICD-10-CM

## 2017-04-15 DIAGNOSIS — M6281 Muscle weakness (generalized): Secondary | ICD-10-CM

## 2017-04-15 DIAGNOSIS — R262 Difficulty in walking, not elsewhere classified: Secondary | ICD-10-CM | POA: Diagnosis not present

## 2017-04-15 NOTE — Therapy (Signed)
[place], [place], [address] Phone: [phone]   Fax:  [phone]  Physical Therapy Treatment  Patient Details  Name: Richard Vang MRN: [id number] Date of Birth: [birth year] Referring Provider: Dr. Sullivan Lone  Encounter Date: 04/15/2017      PT End of Session - 04/15/17 1422    Visit Number 11   Number of Visits 14   Date for PT Re-Evaluation 05/17/17   PT Start Time 1351   PT Stop Time 1433   PT Time Calculation (min) 42 min   Equipment Utilized During Treatment Gait belt   Activity Tolerance Patient tolerated treatment well   Behavior During Therapy [facility] for tasks assessed/performed      Past Medical History:  Diagnosis Date  . Arthritis    "left shoulder" (07/31/2016)  . Coronary artery disease   . GERD (gastroesophageal reflux disease)   . Heart murmur   . High cholesterol   . Hypertension   . Multiple myeloma ([place])   . Sleep apnea    "probably; having test in November" (07/31/2016)  . Type II diabetes mellitus ([place])     Past Surgical History:  Procedure Laterality Date  . CARDIAC CATHETERIZATION N/A 07/31/2016   Procedure: Left Heart Cath and Coronary Angiography;  Surgeon: Lorretta Harp, MD;  Location: [place] [facility];  Service: Cardiovascular;  Laterality: N/A;  . CARDIAC CATHETERIZATION N/A 07/31/2016   Procedure: Coronary Stent Intervention;  Surgeon: Lorretta Harp, MD;  Location: [place] [facility];  Service: Cardiovascular;  Laterality: N/A;  . CORONARY ANGIOPLASTY    . SHOULDER SURGERY Left 1973   "put pin in it where it had separated"   . TONSILLECTOMY  ~ 1956    There were no vitals filed for this visit.      Subjective Assessment - 04/15/17 1354    Subjective My back is still sore but it stays sore. It was not more sore than usual.   Pertinent History Light chain Multiple myeloma with Lytic lesion with aggressive features in the right iliac bone and  possibly other lytic lesions in the L spine , anemia, hypercalcemia and renal insuff (diagnosed in 09/2016)  Not on active treatment now, but trying a drug approved for maintenance.  hypertension, diabetes, dyslipidemia, coronary artery disease.    Patient Stated Goals to walk without falling, go up and down the steps to walk in the yard; get my back to quit hurting   Currently in Pain? Yes   Pain Score 7    Pain Location Back   Pain Orientation Lower   Pain Descriptors / Indicators Aching   Pain Type Chronic pain   Pain Onset More than a month ago                         [facility] Adult PT Treatment/Exercise - 04/15/17 0001      High Level Balance   High Level Balance Activities Braiding;Backward walking;Tandem walking  in parallel bars x 2 times each, with use of 1 HHA on rail     Lumbar Exercises: Supine   Clam 10 reps  With pelvic tilt   Heel Slides 10 reps  2 sets of 5 on each leg with pelvic tilt   Bridge 10 reps   Other Supine Lumbar Exercises pelvic titls with 3 sec holds x 10     Knee/Hip Exercises: Supine   Short Arc Quad Sets Strengthening;Right;Left;2 sets;10 reps  5 lbs each ankle with 3 second holds   Hip Adduction Isometric Both;20 reps  squeezing bolster   Straight Leg Raises Strengthening;Right;Left;10 reps;2 sets   Straight Leg Raises Limitations verbal cues to keep knee straight especially on left                   Short Term Clinic Goals - 03/25/17 1656      CC Short Term Goal  #1   Title Patient will be independent with initial home exercise program   Status Partially Met     CC Short Term Goal  #2   Title Pt. will demonstrate erect posture when walking in his walker for at least 50 feet.   Baseline He can do this when cued and when focused on it.   Status Achieved             Long Term Clinic Goals - 04/15/17 1355      CC Long Term Goal  #1   Title Pt. will be able to stand from sitting without having to push off  with his arms, indicating improved LE strength and mobility.   Baseline 04/15/17- pt unable to do without use of UE   Time 8   Period Weeks   Status On-going     CC Long Term Goal  #2   Title Pt. will be able to negotiate three steps independently, safely, so that he can get in and out of his house on his own.   Time 8   Period Weeks   Status On-going     CC Long Term Goal  #3   Title Pt. will be able to walk 400 feet without needing to rest.   Time 8   Period Weeks   Status On-going     CC Long Term Goal  #4   Title If Berg balance score is below 46 initiallly, score will increase by at least 8 points, indicating improved safety with mobility.   Time 8   Period Weeks   Status On-going            Plan - 04/15/17 1416    Clinical Impression Statement Added more dynamic balance exercises in parallel bars today. Pt did not complain of increased back pain with these. He also was able to complete all with use of only 1 HHA on rail. Continued with core exercises following dynamic balance exercises.    Rehab Potential Good   Clinical Impairments Affecting Rehab Potential no weighted or resisted exercises due to nature of disease   PT Frequency 2x / week   PT Duration 4 weeks   PT Treatment/Interventions ADLs/Self Care Home Management;Electrical Stimulation;Moist Heat;Gait training;DME Instruction;Therapeutic exercise;Balance training;Patient/family education;Manual techniques   PT Next Visit Plan Continue LE strengthening and balance exercises as well as core strengthening.  Continue higher level balance exercises and work on decreasing hand hold assist as pt tolerates.    PT Home Exercise Plan see instruction section   Consulted and Agree with Plan of Care Patient      Patient will benefit from skilled therapeutic intervention in order to improve the following deficits and impairments:  Abnormal gait, Decreased activity tolerance, Decreased balance, Decreased strength,  Pain  Visit Diagnosis: Difficulty in walking, not elsewhere classified  Chronic bilateral low back pain without sciatica  Abnormal posture  Muscle weakness (generalized)       G-Codes - May 11, 2017 1442    Functional Assessment Tool Used (Outpatient Only) clinical judgement  Functional Limitation Mobility: Walking and moving around   Mobility: Walking and Moving Around Current Status [phone]) At least 40 percent but less than 60 percent impaired, limited or restricted   Mobility: Walking and Moving Around Goal Status [phone]) At least 20 percent but less than 40 percent impaired, limited or restricted      Problem List Patient Active Problem List   Diagnosis Date Noted  . Multiple myeloma without remission ([place])   . B12 deficiency   . Lytic bone lesions on xray   . Anemia   . Hypercalcemia 10/02/2016  . Thrombocytopenia ([place]) 10/02/2016  . Low back pain 10/02/2016  . AKI (acute kidney injury) ([place])   . Acute congestive heart failure ([place])   . Bone mass   . CAD S/P percutaneous coronary angioplasty 07/31/2016  . DOE (dyspnea on exertion)   . Essential hypertension 07/18/2016  . Hyperlipidemia 07/18/2016  . Diabetes ([place]) 07/18/2016  . Chronic atrial fibrillation ([place]) 07/18/2016  . Current use of long term anticoagulation 07/18/2016  . Dyspnea on exertion 07/18/2016  . Cardiomyopathy, ischemic: EF ~25 % by LV Gram 07/18/2016  . Abnormal nuclear stress test 07/18/2016    Allyson Sabal [facility] 04/15/2017, 2:34 PM  [place], [place], [address] Phone: [phone]   Fax:  [phone]  Name: Richard Vang MRN: [id number] Date of Birth: May 16, 1946  Manus Gunning, PT 04/15/17 2:34 PM

## 2017-04-20 ENCOUNTER — Encounter: Payer: Self-pay | Admitting: Physical Therapy

## 2017-04-20 ENCOUNTER — Ambulatory Visit: Payer: Medicare HMO | Attending: Hematology | Admitting: Physical Therapy

## 2017-04-20 DIAGNOSIS — M545 Low back pain, unspecified: Secondary | ICD-10-CM

## 2017-04-20 DIAGNOSIS — R262 Difficulty in walking, not elsewhere classified: Secondary | ICD-10-CM | POA: Insufficient documentation

## 2017-04-20 DIAGNOSIS — R293 Abnormal posture: Secondary | ICD-10-CM | POA: Insufficient documentation

## 2017-04-20 DIAGNOSIS — G8929 Other chronic pain: Secondary | ICD-10-CM | POA: Diagnosis present

## 2017-04-20 DIAGNOSIS — M6281 Muscle weakness (generalized): Secondary | ICD-10-CM | POA: Insufficient documentation

## 2017-04-20 NOTE — Therapy (Signed)
[place], [place], [address] Phone: [phone]   Fax:  [phone]  Physical Therapy Treatment  Patient Details  Name: Richard Vang MRN: [id number] Date of Birth: 1946-08-24 Referring Provider: Dr. Sullivan Lone  Encounter Date: 04/20/2017      PT End of Session - 04/20/17 1514    Visit Number 12   Number of Visits 14   Date for PT Re-Evaluation 05/17/17   PT Start Time 3254   PT Stop Time 1517   PT Time Calculation (min) 42 min   Activity Tolerance Patient tolerated treatment well   Behavior During Therapy [facility] for tasks assessed/performed      Past Medical History:  Diagnosis Date  . Arthritis    "left shoulder" (07/31/2016)  . Coronary artery disease   . GERD (gastroesophageal reflux disease)   . Heart murmur   . High cholesterol   . Hypertension   . Multiple myeloma ([place])   . Sleep apnea    "probably; having test in November" (07/31/2016)  . Type II diabetes mellitus ([place])     Past Surgical History:  Procedure Laterality Date  . CARDIAC CATHETERIZATION N/A 07/31/2016   Procedure: Left Heart Cath and Coronary Angiography;  Surgeon: Lorretta Harp, MD;  Location: [place] [facility];  Service: Cardiovascular;  Laterality: N/A;  . CARDIAC CATHETERIZATION N/A 07/31/2016   Procedure: Coronary Stent Intervention;  Surgeon: Lorretta Harp, MD;  Location: [place];  Service: Cardiovascular;  Laterality: N/A;  . CORONARY ANGIOPLASTY    . SHOULDER SURGERY Left 1973   "put pin in it where it had separated"   . TONSILLECTOMY  ~ 1956    There were no vitals filed for this visit.      Subjective Assessment - 04/20/17 1435    Subjective I was sore after last session. It was mostly my back but my legs were also a little sore.    Pertinent History Light chain Multiple myeloma with Lytic lesion with aggressive features in the right iliac bone and possibly other lytic lesions in the  L spine , anemia, hypercalcemia and renal insuff (diagnosed in 09/2016)  Not on active treatment now, but trying a drug approved for maintenance.  hypertension, diabetes, dyslipidemia, coronary artery disease.    Patient Stated Goals to walk without falling, go up and down the steps to walk in the yard; get my back to quit hurting   Currently in Pain? Yes   Pain Score 7    Pain Location Back   Pain Orientation Lower   Pain Descriptors / Indicators Aching   Pain Type Chronic pain   Pain Onset More than a month ago   Pain Frequency Constant   Aggravating Factors  nothing   Pain Relieving Factors pain meds                         [facility] Adult PT Treatment/Exercise - 04/20/17 0001      Lumbar Exercises: Supine   Clam 10 reps  with green theraband   Heel Slides 10 reps  on each leg with pelvic tilt   Bridge 10 reps   Other Supine Lumbar Exercises pelvic titls with 3 sec holds x 10     Knee/Hip Exercises: Seated   Long Arc Quad Both;1 set;10 reps;Weights   Long Arc Quad Weight 5 lbs.   Ball Squeeze 3 sec holds x 10   Knee/Hip Flexion marching  x 10 reps each   Marching Weights 5 lbs.   Hamstring Curl 10 reps;Both;2 sets  with green theraband   Sit to Sand 10 reps;without UE support  with mat table slightly raised     Knee/Hip Exercises: Supine   Straight Leg Raises Strengthening;Right;Left;10 reps;2 sets                   Short Term Clinic Goals - 03/25/17 1656      CC Short Term Goal  #1   Title Patient will be independent with initial home exercise program   Status Partially Met     CC Short Term Goal  #2   Title Pt. will demonstrate erect posture when walking in his walker for at least 50 feet.   Baseline He can do this when cued and when focused on it.   Status Achieved             Long Term Clinic Goals - 04/15/17 1355      CC Long Term Goal  #1   Title Pt. will be able to stand from sitting without having to push off with his  arms, indicating improved LE strength and mobility.   Baseline 04/15/17- pt unable to do without use of UE   Time 8   Period Weeks   Status On-going     CC Long Term Goal  #2   Title Pt. will be able to negotiate three steps independently, safely, so that he can get in and out of his house on his own.   Time 8   Period Weeks   Status On-going     CC Long Term Goal  #3   Title Pt. will be able to walk 400 feet without needing to rest.   Time 8   Period Weeks   Status On-going     CC Long Term Goal  #4   Title If Berg balance score is below 46 initiallly, score will increase by at least 8 points, indicating improved safety with mobility.   Time 8   Period Weeks   Status On-going            Plan - 04/20/17 1656    Clinical Impression Statement Focused today on LE and core strengthening. Progressed some supine exercises to seated exercises today. Patient able to tolerate increased reps and resistance today. Will focus more on balance next session.    Rehab Potential Good   Clinical Impairments Affecting Rehab Potential no weighted or resisted exercises due to nature of disease   PT Frequency 2x / week   PT Duration 4 weeks   PT Treatment/Interventions ADLs/Self Care Home Management;Electrical Stimulation;Moist Heat;Gait training;DME Instruction;Therapeutic exercise;Balance training;Patient/family education;Manual techniques   PT Next Visit Plan Continue LE strengthening and balance exercises as well as core strengthening.  Continue higher level balance exercises and work on decreasing hand hold assist as pt tolerates.    PT Home Exercise Plan see instruction section   Consulted and Agree with Plan of Care Patient      Patient will benefit from skilled therapeutic intervention in order to improve the following deficits and impairments:  Abnormal gait, Decreased activity tolerance, Decreased balance, Decreased strength, Pain  Visit Diagnosis: Difficulty in walking, not  elsewhere classified  Chronic bilateral low back pain without sciatica  Abnormal posture  Muscle weakness (generalized)     Problem List Patient Active Problem List   Diagnosis Date Noted  . Multiple myeloma without remission ([place])   .  B12 deficiency   . Lytic bone lesions on xray   . Anemia   . Hypercalcemia 10/02/2016  . Thrombocytopenia ([place]) 10/02/2016  . Low back pain 10/02/2016  . AKI (acute kidney injury) ([place])   . Acute congestive heart failure ([place])   . Bone mass   . CAD S/P percutaneous coronary angioplasty 07/31/2016  . DOE (dyspnea on exertion)   . Essential hypertension 07/18/2016  . Hyperlipidemia 07/18/2016  . Diabetes ([place]) 07/18/2016  . Chronic atrial fibrillation ([place]) 07/18/2016  . Current use of long term anticoagulation 07/18/2016  . Dyspnea on exertion 07/18/2016  . Cardiomyopathy, ischemic: EF ~25 % by LV Gram 07/18/2016  . Abnormal nuclear stress test 07/18/2016    Allyson Sabal [facility] 04/20/2017, 4:58 PM  [place], [place], [address] Phone: [phone]   Fax:  [phone]  Name: WALTON DIGILIO MRN: [id number] Date of Birth: 1946-02-20  Manus Gunning, PT 04/20/17 4:58 PM

## 2017-04-23 ENCOUNTER — Ambulatory Visit: Payer: Medicare HMO | Admitting: Physical Therapy

## 2017-04-23 ENCOUNTER — Other Ambulatory Visit: Payer: Self-pay | Admitting: Hematology

## 2017-04-23 DIAGNOSIS — M545 Low back pain, unspecified: Secondary | ICD-10-CM

## 2017-04-23 DIAGNOSIS — C9000 Multiple myeloma not having achieved remission: Secondary | ICD-10-CM

## 2017-04-23 DIAGNOSIS — R293 Abnormal posture: Secondary | ICD-10-CM

## 2017-04-23 DIAGNOSIS — M6281 Muscle weakness (generalized): Secondary | ICD-10-CM

## 2017-04-23 DIAGNOSIS — R262 Difficulty in walking, not elsewhere classified: Secondary | ICD-10-CM

## 2017-04-23 DIAGNOSIS — G8929 Other chronic pain: Secondary | ICD-10-CM

## 2017-04-23 NOTE — Therapy (Signed)
[place], [place], [address] Phone: [phone]   Fax:  [phone]  Physical Therapy Treatment  Patient Details  Name: Richard Vang MRN: [id number] Date of Birth: 11/24/1945 Referring Provider: Dr. Sullivan Lone  Encounter Date: 04/23/2017      PT End of Session - 04/23/17 1223    Visit Number 13   Number of Visits 14   Date for PT Re-Evaluation 05/17/17   PT Start Time 9417   PT Stop Time 1100   PT Time Calculation (min) 45 min   Activity Tolerance Patient tolerated treatment well   Behavior During Therapy [facility] for tasks assessed/performed      Past Medical History:  Diagnosis Date  . Arthritis    "left shoulder" (07/31/2016)  . Coronary artery disease   . GERD (gastroesophageal reflux disease)   . Heart murmur   . High cholesterol   . Hypertension   . Multiple myeloma ([place])   . Sleep apnea    "probably; having test in November" (07/31/2016)  . Type II diabetes mellitus ([place])     Past Surgical History:  Procedure Laterality Date  . CARDIAC CATHETERIZATION N/A 07/31/2016   Procedure: Left Heart Cath and Coronary Angiography;  Surgeon: Lorretta Harp, MD;  Location: [place] [facility];  Service: Cardiovascular;  Laterality: N/A;  . CARDIAC CATHETERIZATION N/A 07/31/2016   Procedure: Coronary Stent Intervention;  Surgeon: Lorretta Harp, MD;  Location: [place] [facility];  Service: Cardiovascular;  Laterality: N/A;  . CORONARY ANGIOPLASTY    . SHOULDER SURGERY Left 1973   "put pin in it where it had separated"   . TONSILLECTOMY  ~ 1956    There were no vitals filed for this visit.      Subjective Assessment - 04/23/17 1023    Subjective Pt says he is hurting today.  He thinks he did "too much" at home.  He tried to play corn hole at a 4th of July picnic but wasn't able to make it.  He walked alot without a walker. He knows the PT has been helping his legs.    Pertinent History  Light chain Multiple myeloma with Lytic lesion with aggressive features in the right iliac bone and possibly other lytic lesions in the L spine , anemia, hypercalcemia and renal insuff (diagnosed in 09/2016)  Not on active treatment now, but trying a drug approved for maintenance.  hypertension, diabetes, dyslipidemia, coronary artery disease.    Patient Stated Goals to walk without falling, go up and down the steps to walk in the yard; get my back to quit hurting 04/23/2017  He states he is not falling anymore and is walking in his yard    Currently in Pain? Yes   Pain Score 7    Pain Location Back   Pain Orientation Left;Lower   Pain Descriptors / Indicators Aching   Pain Type Chronic pain   Pain Radiating Towards no radiation today                          [facility] Adult PT Treatment/Exercise - 04/23/17 0001      High Level Balance   High Level Balance Activities Side stepping;Direction changes;Other (comment)   High Level Balance Comments walking lunges with/without knee raise, hand together out in front and back to each hip x 5 at waist and shoulder height.      Knee/Hip Exercises: Standing   Knee  Flexion Strengthening;Right;Left;10 reps;Other (comment)   Forward Lunges Right;Left;2 sets;5 reps   Hip ADduction Strengthening;Both;10 reps   Hip ADduction Limitations yellow ball inbetween knees    Hip Abduction AROM;Right;Left;10 reps   Forward Step Up Right;Left;2 sets;10 reps;Hand Hold: 2;Step Height: 4"   Functional Squat 1 set;10 reps  mini squats in parrallel bars    Other Standing Knee Exercises mini lunge 5 reps x 2    Other Standing Knee Exercises weight shift inside parrallel bars with hip touching each side x 10 reps and front to back x 10 reps with varying speeds     Shoulder Exercises: Standing   Row Strengthening;Right;Left;Both;10 reps;Theraband  elbows bent    Theraband Level (Shoulder Row) Level 1 (Yellow)   Other Standing Exercises low rows with yellow  theraband uniilaterall and bilaterally.    Other Standing Exercises modified push ups x 15 against on parallel  bar                    Short Term Clinic Goals - 03/25/17 1656      CC Short Term Goal  #1   Title Patient will be independent with initial home exercise program   Status Partially Met     CC Short Term Goal  #2   Title Pt. will demonstrate erect posture when walking in his walker for at least 50 feet.   Baseline He can do this when cued and when focused on it.   Status Achieved             Long Term Clinic Goals - 04/23/17 1100      CC Long Term Goal  #1   Title Pt. will be able to stand from sitting without having to push off with his arms, indicating improved LE strength and mobility.   Status Achieved            Plan - 04/23/17 1058    Clinical Impression Statement worked in [employer] today for variety of LE ,UE and core exerices with focus on balance,  Pt was able to do all, but needed frequent cues for posture to keep core engaged.  He states workout was about an 8 out of 10 in perceived exertion. but he felt fine after workout  Pt wants to progress to walking with a straight cane    Rehab Potential Good   Clinical Impairments Affecting Rehab Potential no weighted or resisted exercises due to nature of disease   PT Frequency 2x / week   PT Duration 4 weeks   PT Treatment/Interventions ADLs/Self Care Home Management;Electrical Stimulation;Moist Heat;Gait training;DME Instruction;Therapeutic exercise;Balance training;Patient/family education;Manual techniques   PT Next Visit Plan assess goals.  work on IT trainer.  Pt wants to progress to walking with a straight cane so begin to work on that.  continue exercise as indicated    Consulted and Agree with Plan of Care Patient      Patient will benefit from skilled therapeutic intervention in order to improve the following deficits and impairments:  Abnormal gait, Decreased activity  tolerance, Decreased balance, Decreased strength, Pain  Visit Diagnosis: Difficulty in walking, not elsewhere classified  Chronic bilateral low back pain without sciatica  Abnormal posture  Muscle weakness (generalized)     Problem List Patient Active Problem List   Diagnosis Date Noted  . Multiple myeloma without remission ([place])   . B12 deficiency   . Lytic bone lesions on xray   . Anemia   . Hypercalcemia 10/02/2016  .  Thrombocytopenia ([place]) 10/02/2016  . Low back pain 10/02/2016  . AKI (acute kidney injury) ([place])   . Acute congestive heart failure ([place])   . Bone mass   . CAD S/P percutaneous coronary angioplasty 07/31/2016  . DOE (dyspnea on exertion)   . Essential hypertension 07/18/2016  . Hyperlipidemia 07/18/2016  . Diabetes ([place]) 07/18/2016  . Chronic atrial fibrillation ([place]) 07/18/2016  . Current use of long term anticoagulation 07/18/2016  . Dyspnea on exertion 07/18/2016  . Cardiomyopathy, ischemic: EF ~25 % by LV Gram 07/18/2016  . Abnormal nuclear stress test 07/18/2016  Donato Heinz. Owens Shark PT   Norwood Levo 04/23/2017, 12:26 PM  [place], [place], [address] Phone: [phone]   Fax:  [phone]  Name: Richard Vang MRN: [id number] Date of Birth: [birth year]

## 2017-04-23 NOTE — Progress Notes (Signed)
Richard Vang    HEMATOLOGY/ONCOLOGY CLINIC NOTE  Date of Service: .04/02/2017  Patient Care Team: Sandi Mariscal, MD as PCP - General (Internal Medicine)  CHIEF COMPLAINTS/PURPOSE OF CONSULTATION:  f/u for Multiple Myeloma  HISTORY OF PRESENTING ILLNESS:  plz see previous note for details on HPI  INTERVAL HISTORY  [name] is here for his scheduled followup for myeloma along with his wife for reevaluation. He notes that he has benefited a lot from outpatient rehab and has been getting more physically active at home. Back pain improved. He notes his diarrhea has improved as well. His Revlimid is still not available at this time. Baseline BP lower. No other acute new symptoms.   MEDICAL HISTORY:  Past Medical History:  Diagnosis Date  . Arthritis    "left shoulder" (07/31/2016)  . Coronary artery disease   . GERD (gastroesophageal reflux disease)   . Heart murmur   . High cholesterol   . Hypertension   . Multiple myeloma ([place])   . Sleep apnea    "probably; having test in November" (07/31/2016)  . Type II diabetes mellitus ([place])     SURGICAL HISTORY: Past Surgical History:  Procedure Laterality Date  . CARDIAC CATHETERIZATION N/A 07/31/2016   Procedure: Left Heart Cath and Coronary Angiography;  Surgeon: Lorretta Harp, MD;  Location: [facility];  Service: Cardiovascular;  Laterality: N/A;  . CARDIAC CATHETERIZATION N/A 07/31/2016   Procedure: Coronary Stent Intervention;  Surgeon: Lorretta Harp, MD;  Location: [facility];  Service: Cardiovascular;  Laterality: N/A;  . CORONARY ANGIOPLASTY    . SHOULDER SURGERY Left 1973   "put pin in it where it had separated"   . TONSILLECTOMY  ~ 1956    SOCIAL HISTORY: Social History   Social History  . Marital status: Divorced    Spouse name: N/A  . Number of children: N/A  . Years of education: N/A   Occupational History  . Not on file.   Social History Main Topics  . Smoking status: Never Smoker  . Smokeless  tobacco: Never Used  . Alcohol use Yes     Comment: 07/31/2016 "nothing since 2002"  . Drug use: No  . Sexual activity: Not Currently   Other Topics Concern  . Not on file   Social History Narrative  . No narrative on file    FAMILY HISTORY: Family History  Problem Relation Age of Onset  . Hypertension Other     ALLERGIES:  has No Known Allergies.  MEDICATIONS:  Current Outpatient Prescriptions  Medication Sig Dispense Refill  . acyclovir (ZOVIRAX) 400 MG tablet TAKE ONE TABLET BY MOUTH TWICE DAILY 60 tablet 3  . amLODipine (NORVASC) 5 MG tablet Take 5 mg by mouth daily.    Richard Vang atorvastatin (LIPITOR) 40 MG tablet Take 1 tablet (40 mg total) by mouth at bedtime. 30 tablet 12  . carvedilol (COREG) 12.5 MG tablet Take 1 tablet (12.5 mg total) by mouth 2 (two) times daily with a meal. 60 tablet 2  . carvedilol (COREG) 12.5 MG tablet Take 12.5 mg by mouth 2 (two) times daily with a meal.    . Cholecalciferol (VITAMIN D3) 5000 units CAPS Take 1 capsule by mouth daily.     . clopidogrel (PLAVIX) 75 MG tablet Take 1 tablet (75 mg total) by mouth daily with breakfast. 30 tablet 12  . Cyanocobalamin (B-12) 1000 MCG SUBL Place 1,000 mcg under the tongue daily. (Patient not taking: Reported on 03/11/2017) 30 each 3  .  escitalopram (LEXAPRO) 5 MG tablet Take 5 mg by mouth daily.    . feeding supplement, ENSURE ENLIVE, (ENSURE ENLIVE) LIQD Take 237 mLs by mouth 2 (two) times daily between meals. 237 mL 12  . furosemide (LASIX) 40 MG tablet     . insulin aspart (NOVOLOG) 100 UNIT/ML injection Correction coverage: Sensitive (thin, NPO, renal)  CBG < 70: implement hypoglycemia protocol  CBG 70 - 120: 0 units  CBG 121 - 150: 1 unit  CBG 151 - 200: 2 units  CBG 201 - 250: 3 units  CBG 251 - 300: 5 units  CBG 301 - 350: 7 units  CBG 351 - 400 9 units  CBG > 400 call MD and obtain STAT lab verification 10 mL 11  . lactobacillus acidophilus & bulgar (LACTINEX) chewable tablet Chew 1 tablet by  mouth 3 (three) times daily with meals. (Patient not taking: Reported on 03/11/2017) 30 tablet 0  . lenalidomide (REVLIMID) 10 MG capsule Take 1 capsule (10 mg total) by mouth daily. Auth [id number] 21 capsule 0  . montelukast (SINGULAIR) 10 MG tablet Take 10 mg by mouth at bedtime.    . nitroGLYCERIN (NITROSTAT) 0.3 MG SL tablet Place 0.3 mg under the tongue as needed for chest pain.    Richard Vang omeprazole (PRILOSEC) 40 MG capsule Take 40 mg by mouth daily.    . ondansetron (ZOFRAN) 8 MG tablet Take 1 tablet (8 mg total) by mouth 2 (two) times daily as needed for refractory nausea / vomiting. Starting on days 4 and 11 not on day 8. 30 tablet 1  . oxyCODONE (OXY IR/ROXICODONE) 5 MG immediate release tablet Take 2 tablets (10 mg total) by mouth every 6 (six) hours as needed for severe pain. 60 tablet 0  . polyethylene glycol (MIRALAX / GLYCOLAX) packet Take 17 g by mouth daily.    . potassium chloride SA (K-DUR,KLOR-CON) 20 MEQ tablet Take 1 tablet (20 mEq total) by mouth daily.    . prochlorperazine (COMPAZINE) 10 MG tablet Take 1 tablet (10 mg total) by mouth every 6 (six) hours as needed (Nausea or vomiting). 30 tablet 1  . vancomycin (VANCOCIN) 125 MG capsule Take 1 capsule (125 mg total) by mouth 4 (four) times daily. 40 capsule 0  . warfarin (COUMADIN) 6 MG tablet Take 4 mg by mouth every morning.      No current facility-administered medications for this visit.     REVIEW OF SYSTEMS:    10 Point review of Systems was done is negative except as noted above.  PHYSICAL EXAMINATION: ECOG PERFORMANCE STATUS: 3 - Symptomatic, >50% confined to bed  . Vitals:   04/02/17 1211  BP: (!) 103/56  Pulse: 61  Resp: 17  Temp: 97.8 F (36.6 C)   Filed Weights   04/02/17 1211  Weight: 162 lb 14.4 oz (73.9 kg)   .Body mass index is 23.37 kg/m.  GENERAL: NAD SKIN: petechiae over upper extremities OROPHARYNX: MMM  NECK: supple,+ JVD, LYMPH: no palpable lymphadenopathy in the cervical, axillary or  inguinal LUNGS: CTA b/l HEART: S1-S2 irreg ABDOMEN: abdomen soft, non-tender, normoactive bowel sounds, no hepatosplenomegaly palpable PSYCH: Alert and oriented 3  NEURO: non focal. LE : b/l trace pedal edema   LABORATORY DATA:  I have reviewed the data as listed . CBC Latest Ref Rng & Units 04/02/2017 03/05/2017 02/09/2017  WBC 4.0 - 10.3 10e3/uL 5.0 4.9 3.8(L)  Hemoglobin 13.0 - 17.1 g/dL 12.3(L) 11.7(L) 10.7(L)  Hematocrit 38.4 - 49.9 % 37.4(L) 35.6(L)  33.2(L)  Platelets 140 - 400 10e3/uL 147 130(L) 154   . CBC    Component Value Date/Time   WBC 5.0 04/02/2017 1151   WBC 3.4 (L) 10/08/2016 0234   RBC 3.97 (L) 04/02/2017 1151   RBC 3.05 (L) 10/08/2016 0234   HGB 12.3 (L) 04/02/2017 1151   HCT 37.4 (L) 04/02/2017 1151   PLT 147 04/02/2017 1151   MCV 94.2 04/02/2017 1151   MCH 31.0 04/02/2017 1151   MCH 30.2 10/08/2016 0234   MCHC 32.9 04/02/2017 1151   MCHC 30.4 10/08/2016 0234   RDW 15.0 (H) 04/02/2017 1151   LYMPHSABS 0.6 (L) 04/02/2017 1151   MONOABS 0.4 04/02/2017 1151   EOSABS 0.1 04/02/2017 1151   BASOSABS 0.1 04/02/2017 1151    . CMP Latest Ref Rng & Units 04/02/2017 03/05/2017 03/05/2017  Glucose 70 - 140 mg/dl 103 108 -  BUN 7.0 - 26.0 mg/dL 27.6(H) 23.3 -  Creatinine 0.7 - 1.3 mg/dL 1.2 1.2 -  Sodium 136 - 145 mEq/L 143 143 -  Potassium 3.5 - 5.1 mEq/L 4.0 4.2 -  Chloride 101 - 111 mmol/L - - -  CO2 22 - 29 mEq/L 26 25 -  Calcium 8.4 - 10.4 mg/dL 9.4 9.2 -  Total Protein 6.4 - 8.3 g/dL 6.1(L) 5.7(L) 5.4(L)  Total Bilirubin 0.20 - 1.20 mg/dL 0.76 0.67 -  Alkaline Phos 40 - 150 U/L 166(H) 99 -  AST 5 - 34 U/L 129(H) 25 -  ALT 0 - 55 U/L 134(H) 24 -      RADIOGRAPHIC STUDIES: I have personally reviewed the radiological images as listed and agreed with the findings in the report. No results found.  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  K/L 95, No M spike on SPEP -- suggests light chain MM Cytogenetics - normal male chromosomes FISH- +11 and 13q-/-13  Patient is s/p 1 cycle of Vd and Serum free kappa LC has decreased from 700 to 203.6 with improvement in his K/L ratio from 94.59 to 29. Completed 2nd cycle of treatment with Vd + Cytoxan (231m/m2) Completed 3rd cycle of treatment with Vd + Cytoxan (2058mm2) - cytoxan held D15 due to thrombocytopenia Completed 4th of VCd Discontinued VCd after C5D8 due to intolerance with diarrhea and increasing neuropathy and fatigue with borderline functional status.  Kappa free light chains have improved progressively from 700 down to around 26 with improving K/L ratio.  2) CAD s/p prox LAD DES PCI on 07/31/2016 with ischemic cardiomyopathy ejection fraction of 25-35%. Rpt ECHO on 10/03/2016 shows improvement in EF to 55-60% with no RWMABN.  3) Macrocytic Anemia with moderate thrombocytopenia - due to MM and B12 def hgb has improved to 12.3  5) Thrombocytopenia - due to MM and treatment --resolved and currently are at  147k   6) B12 deficiency - Received [place] B12 daily while in the hopsital -cont SL B12 100035mpo daily  PLAN -continue outpatient rehab-- appears to benefitted from this significantly. -still Awaiting delivery of  maintenance Revlimid 10  mg by mouth daily 3 weeks on one week off. -If he tolerates that Revlimid but shows signs of progression we could potentially treat him with Rd in the future. -continue on antiplatelet  therapy .  patient is on Plavix for CAD and also on warfarin for atrial fibrillation . -on Acyclovir prophylaxis - will need to be continued for at least 6 months after discontinuation of Velcade . -continue q4weeks IV Pamidronate  -patient is off Oxycontin and on prn oxycodone. No significant myeloma-related pain at this time -He has issues with chronic back pain due to degenerative disc disease for which he needs to have a care plan with his primary care physician.    6) b/l Lower extremity swelling likely due to CHF an CKD. Much improved. Plan -continued adjustment and monitoring of diuretic therapy as per PCP and cardiology -Potassium levels are normal today.  Richard Kitchen7) CAD s/p recent DES 8) Afib with RVR on coumadin -continue mx per PCP and cardiology  9) Decubitus Ulcers on buttocks -healed -wound care RN mx this.  10) diarrhea - muc improved. Stool studies for GI pathogen panel and C. Difficile neg S/p empiric treatment for c diff with po vancomycin Plan -conitnue probiotoics -Maintain good hydration. -Might need to cut down acyclovir dose if his diarrhea does not improve. -Patient is not currently on any laxatives.  11) Elevated Alkaline phosphatase, AST, ALT. No abdominal pain no fevers. Previous CT abd in 09/2016 has shown signs of liver cirrhosis, choledocholithiasis.  ?hepatic congestion from CHF Plan -f/u with PCP for further w/u on abnormal LFts -counseled to report fevers and increasing abdominal pain ASAP   RTC in 4 weeks with labs with Dr Irene Limbo  All of the patients questions were answered with apparent satisfaction. The patient knows to call the clinic with any problems, questions or concerns.  I spent 20 minutes counseling the patient face to face. The total time spent in the appointment was 30 minutes and more than 50% was on counseling and direct patient cares.    Sullivan Lone MD [place] AAHIVMS [facility] Hematology/Oncology Physician [facility]  (Office):       [phone] (Work cell):  [phone] (Fax):           [phone]

## 2017-04-27 ENCOUNTER — Ambulatory Visit: Payer: Medicare HMO | Admitting: Physical Therapy

## 2017-04-27 DIAGNOSIS — R293 Abnormal posture: Secondary | ICD-10-CM

## 2017-04-27 DIAGNOSIS — M545 Low back pain: Secondary | ICD-10-CM

## 2017-04-27 DIAGNOSIS — M6281 Muscle weakness (generalized): Secondary | ICD-10-CM

## 2017-04-27 DIAGNOSIS — R262 Difficulty in walking, not elsewhere classified: Secondary | ICD-10-CM

## 2017-04-27 DIAGNOSIS — G8929 Other chronic pain: Secondary | ICD-10-CM

## 2017-04-28 ENCOUNTER — Telehealth: Payer: Self-pay

## 2017-04-28 NOTE — Therapy (Signed)
[place], [place], [address] Phone: [phone]   Fax:  [phone]  Physical Therapy Treatment  Patient Details  Name: Richard Vang MRN: [id number] Date of Birth: 1946/03/30 Referring Provider: Dr. Sullivan Lone  Encounter Date: 04/27/2017      PT End of Session - 04/28/17 0740    Visit Number 14   Number of Visits 30   Date for PT Re-Evaluation 06/26/17   PT Start Time 2500   PT Stop Time 1606   PT Time Calculation (min) 43 min   Activity Tolerance Patient tolerated treatment well   Behavior During Therapy [facility] for tasks assessed/performed      Past Medical History:  Diagnosis Date  . Arthritis    "left shoulder" (07/31/2016)  . Coronary artery disease   . GERD (gastroesophageal reflux disease)   . Heart murmur   . High cholesterol   . Hypertension   . Multiple myeloma ([place])   . Sleep apnea    "probably; having test in November" (07/31/2016)  . Type II diabetes mellitus ([place])     Past Surgical History:  Procedure Laterality Date  . CARDIAC CATHETERIZATION N/A 07/31/2016   Procedure: Left Heart Cath and Coronary Angiography;  Surgeon: Lorretta Harp, MD;  Location: [facility];  Service: Cardiovascular;  Laterality: N/A;  . CARDIAC CATHETERIZATION N/A 07/31/2016   Procedure: Coronary Stent Intervention;  Surgeon: Lorretta Harp, MD;  Location: [facility];  Service: Cardiovascular;  Laterality: N/A;  . CORONARY ANGIOPLASTY    . SHOULDER SURGERY Left 1973   "put pin in it where it had separated"   . TONSILLECTOMY  ~ 1956    There were no vitals filed for this visit.      Subjective Assessment - 04/27/17 1526    Subjective They're going okay, as far I know.  My back hurts a whole lot more than usual. Went for about four days without it hurting almost at all, but these last four days it's killing me. Everything we've been doing in therapy is helping. Says his legs  have gotten a whole lot stronger, which he can tell because it's easier to stand up than it used to be; also because he can lift some weights in exercise.   Currently in Pain? Yes   Pain Score 8    Pain Location Back   Pain Orientation Left;Lower   Aggravating Factors  walking more, and walking without assistive device inside the house   Pain Relieving Factors pain pills and lying down                         [facility] Adult PT Treatment/Exercise - 04/28/17 0001      Elbow Exercises   Elbow Flexion Strengthening;Both;10 reps;Seated   Bar Weights/Barbell (Elbow Flexion) 4 lbs  this was quite challenging   Other elbow exercises countertop pushups x 10 x 2     Knee/Hip Exercises: Standing   Heel Raises Both;10 reps;20 reps;1 second  hands on walker for support   Knee Flexion Strengthening;Right;Left;2 sets;10 reps  5 lbs. on each ankle   Hip ADduction Strengthening;Right;Left;10 reps;2 sets  5 lbs. on each ankle     Knee/Hip Exercises: Seated   Long Arc Quad 10 reps;Weights;Right;Left;3 sets   [employer] Weight 5 lbs.   Knee/Hip Flexion marching x 20 reps each    Sit to Sand 10 reps;without UE support  with  mat table slightly raised     Knee/Hip Exercises: Supine   Bridges with Diona Foley Squeeze Strengthening;Both;10 reps   Straight Leg Raises Strengthening;Right;Left;10 reps;2 sets     Shoulder Exercises: Seated   Flexion Strengthening;Both;10 reps;Weights   Flexion Weight (lbs) 1   Abduction Strengthening;Both;10 reps;Weights   ABduction Weight (lbs) 1  through partial ROM only   Other Seated Exercises scaption, bilat. 1 lb. each hand x 10  difficult, through partial ROM only                   Short Term Clinic Goals - 04/27/17 1529      CC Short Term Goal  #1   Title Patient will be independent with initial home exercise program   Status Achieved     CC Short Term Goal  #2   Title Pt. will demonstrate erect posture when walking in his walker  for at least 50 feet.   Status Achieved             Long Term Clinic Goals - 04/27/17 1531      CC Long Term Goal  #1   Title Pt. will be able to stand from sitting without having to push off with his arms, indicating improved LE strength and mobility.   Baseline 04/27/17--can stand with hands on thighs from standard height chair   Status Achieved     CC Long Term Goal  #2   Title Pt. will be able to negotiate three steps independently, safely, so that he can get in and out of his house on his own.   Status On-going     CC Long Term Goal  #3   Title Pt. will be able to walk 400 feet without needing to rest.   Status On-going     CC Long Term Goal  #4   Title If Berg balance score is below 46 initiallly, score will increase by at least 8 points, indicating improved safety with mobility.   Status On-going            Plan - 04/28/17 0741    Clinical Impression Statement Patient continues to be very motivated, performing exercises as asked of him without complaint.  He reports significant pain when asked, but felt the TENS unit didn't do enough to help him to warrant buying one.  He does report functional improvements, such as greater ease in coming to standing from sitting, and feels therapy has caused this improvement, so wants to continue.   Rehab Potential Good   PT Frequency 2x / week   PT Duration 4 weeks   PT Treatment/Interventions ADLs/Self Care Home Management;Electrical Stimulation;Moist Heat;Gait training;DME Instruction;Therapeutic exercise;Balance training;Patient/family education;Manual techniques   PT Next Visit Plan Work on stair training.  Pt wants to progress to walking with a straight cane so begin to work on that.  continue exercise as indicated    Consulted and Agree with Plan of Care Patient      Patient will benefit from skilled therapeutic intervention in order to improve the following deficits and impairments:  Abnormal gait, Decreased activity  tolerance, Decreased balance, Decreased strength, Pain  Visit Diagnosis: Difficulty in walking, not elsewhere classified - Plan: PT plan of care cert/re-cert  Chronic bilateral low back pain without sciatica - Plan: PT plan of care cert/re-cert  Abnormal posture - Plan: PT plan of care cert/re-cert  Muscle weakness (generalized) - Plan: PT plan of care cert/re-cert     Problem List Patient Active Problem  List   Diagnosis Date Noted  . Multiple myeloma without remission ([place])   . B12 deficiency   . Lytic bone lesions on xray   . Anemia   . Hypercalcemia 10/02/2016  . Thrombocytopenia ([place]) 10/02/2016  . Low back pain 10/02/2016  . AKI (acute kidney injury) ([place])   . Acute congestive heart failure ([place])   . Bone mass   . CAD S/P percutaneous coronary angioplasty 07/31/2016  . DOE (dyspnea on exertion)   . Essential hypertension 07/18/2016  . Hyperlipidemia 07/18/2016  . Diabetes ([place]) 07/18/2016  . Chronic atrial fibrillation ([place]) 07/18/2016  . Current use of long term anticoagulation 07/18/2016  . Dyspnea on exertion 07/18/2016  . Cardiomyopathy, ischemic: EF ~25 % by LV Gram 07/18/2016  . Abnormal nuclear stress test 07/18/2016    Shajuan Musso 04/28/2017, 7:46 AM  [place], [place], [address] Phone: [phone]   Fax:  [phone]  Name: LINDWOOD MOGEL MRN: [id number] Date of Birth: July 07, 1946  Serafina Royals, PT 04/28/17 7:46 AM

## 2017-04-28 NOTE — Telephone Encounter (Signed)
Per Dr. Irene Limbo left message with pt to let him know that LFTs were abnormal and f/u through PCP was recommended. Attempt to reach wife at work, but unable.

## 2017-04-29 ENCOUNTER — Ambulatory Visit: Payer: Medicare HMO

## 2017-04-29 DIAGNOSIS — M6281 Muscle weakness (generalized): Secondary | ICD-10-CM

## 2017-04-29 DIAGNOSIS — G8929 Other chronic pain: Secondary | ICD-10-CM

## 2017-04-29 DIAGNOSIS — M545 Low back pain: Secondary | ICD-10-CM

## 2017-04-29 DIAGNOSIS — R262 Difficulty in walking, not elsewhere classified: Secondary | ICD-10-CM

## 2017-04-29 DIAGNOSIS — R293 Abnormal posture: Secondary | ICD-10-CM

## 2017-04-29 NOTE — Therapy (Signed)
[place], [place], [address] Phone: [phone]   Fax:  [phone]  Physical Therapy Treatment  Patient Details  Name: Richard Vang MRN: [id number] Date of Birth: May 26, 1946 Referring Provider: Dr. Sullivan Lone  Encounter Date: 04/29/2017      PT End of Session - 04/29/17 1711    Visit Number 15   Number of Visits 30   Date for PT Re-Evaluation 06/26/17   PT Start Time 3094   PT Stop Time 1602   PT Time Calculation (min) 41 min   Activity Tolerance Patient tolerated treatment well   Behavior During Therapy [facility] for tasks assessed/performed      Past Medical History:  Diagnosis Date  . Arthritis    "left shoulder" (07/31/2016)  . Coronary artery disease   . GERD (gastroesophageal reflux disease)   . Heart murmur   . High cholesterol   . Hypertension   . Multiple myeloma ([place])   . Sleep apnea    "probably; having test in November" (07/31/2016)  . Type II diabetes mellitus ([place])     Past Surgical History:  Procedure Laterality Date  . CARDIAC CATHETERIZATION N/A 07/31/2016   Procedure: Left Heart Cath and Coronary Angiography;  Surgeon: Lorretta Harp, MD;  Location: [place] [facility];  Service: Cardiovascular;  Laterality: N/A;  . CARDIAC CATHETERIZATION N/A 07/31/2016   Procedure: Coronary Stent Intervention;  Surgeon: Lorretta Harp, MD;  Location: [place] [facility];  Service: Cardiovascular;  Laterality: N/A;  . CORONARY ANGIOPLASTY    . SHOULDER SURGERY Left 1973   "put pin in it where it had separated"   . TONSILLECTOMY  ~ 1956    There were no vitals filed for this visit.      Subjective Assessment - 04/29/17 1525    Subjective My back is still hurting, not sure why it's flared up.    Pertinent History Light chain Multiple myeloma with Lytic lesion with aggressive features in the right iliac bone and possibly other lytic lesions in the L spine , anemia, hypercalcemia  and renal insuff (diagnosed in 09/2016)  Not on active treatment now, but trying a drug approved for maintenance.  hypertension, diabetes, dyslipidemia, coronary artery disease.    Patient Stated Goals to walk without falling, go up and down the steps to walk in the yard; get my back to quit hurting 04/23/2017  He states he is not falling anymore and is walking in his yard    Currently in Pain? Yes   Pain Score 8    Pain Location Back   Pain Orientation Left;Lower   Pain Descriptors / Indicators Aching;Sharp   Pain Type Chronic pain   Pain Onset More than a month ago   Pain Frequency Constant   Aggravating Factors  walking after awhile   Pain Relieving Factors pain pills and lying down                         [facility] Adult PT Treatment/Exercise - 04/29/17 0001      Ambulation/Gait   Ambulation/Gait Yes   Ambulation/Gait Assistance 6: Modified independent (Device/Increase time)   Ambulation Distance (Feet) 10 Feet   Assistive device Straight cane   Gait Pattern Step-through pattern  2 point pattern with cane   Ambulation Surface Level   Gait Comments Instructed pt in difference between 2 and 3 point gait with [facility] verbally and with therapist demonstration. After practicing decided  he did best and was safest with 2 point pattern and was even able to step through with this.     Elbow Exercises   Elbow Flexion Strengthening;Both;10 reps;Seated   Bar Weights/Barbell (Elbow Flexion) 4 lbs  pt reports challenging but able to complete   Other elbow exercises In // bars for push ups on one bar (at 8 holes high)     Knee/Hip Exercises: Standing   Heel Raises Both;20 reps  In // bars   Knee Flexion Strengthening;Both;10 reps  2 lbs each ankle, less weight due to inc LBP   Hip Abduction AROM;Right;Left;10 reps  2 lbs each ankle at back of bike   Hip Extension Stengthening;Both;1 set;10 reps;Knee straight  2 lbs each ankle at back of bike     Knee/Hip Exercises: Seated    Long Arc Quad 10 reps;Weights;Right;Left;3 sets   Long Arc Quad Weight 5 lbs.   Knee/Hip Flexion marching x 20 reps each with 2 lbs each ankle, VC for slow, controlled ROM     Shoulder Exercises: Seated   Flexion Strengthening;Both;10 reps;Weights   Flexion Weight (lbs) 1   Abduction Strengthening;Both;10 reps;Weights   ABduction Weight (lbs) 1  through partial ROM only   Other Seated Exercises scaption, bilat. 1 lb. each hand x 10  difficult, through partial ROM only                   Short Term Clinic Goals - 04/27/17 1529      CC Short Term Goal  #1   Title Patient will be independent with initial home exercise program   Status Achieved     CC Short Term Goal  #2   Title Pt. will demonstrate erect posture when walking in his walker for at least 50 feet.   Status Achieved             Long Term Clinic Goals - 04/27/17 1531      CC Long Term Goal  #1   Title Pt. will be able to stand from sitting without having to push off with his arms, indicating improved LE strength and mobility.   Baseline 04/27/17--can stand with hands on thighs from standard height chair   Status Achieved     CC Long Term Goal  #2   Title Pt. will be able to negotiate three steps independently, safely, so that he can get in and out of his house on his own.   Status On-going     CC Long Term Goal  #3   Title Pt. will be able to walk 400 feet without needing to rest.   Status On-going     CC Long Term Goal  #4   Title If Berg balance score is below 46 initiallly, score will increase by at least 8 points, indicating improved safety with mobility.   Status On-going            Plan - 04/29/17 1712    Clinical Impression Statement Pt did very well with gait training with SPC in // bars. We practiced both 2 and 3 point gait and he did best with 2 point gait and reported feeling safest with this. His LBP is still very flared up but he did not allow this to stop him from exercising  today and continues to report benefit of therapy. Also adjusted pts walker as it was too high for him and he was able to ambulate with more erect posture after correction.    Rehab  Potential Good   Clinical Impairments Affecting Rehab Potential no weighted or resisted exercises due to nature of disease   PT Frequency 2x / week   PT Duration 4 weeks   PT Treatment/Interventions ADLs/Self Care Home Management;Electrical Stimulation;Moist Heat;Gait training;DME Instruction;Therapeutic exercise;Balance training;Patient/family education;Manual techniques   PT Next Visit Plan Work on stair training. Ask pt how he is doing with gait with SPC since practicing this today; continue exercise as indicated    Consulted and Agree with Plan of Care Patient      Patient will benefit from skilled therapeutic intervention in order to improve the following deficits and impairments:  Abnormal gait, Decreased activity tolerance, Decreased balance, Decreased strength, Pain  Visit Diagnosis: Difficulty in walking, not elsewhere classified  Chronic bilateral low back pain without sciatica  Abnormal posture  Muscle weakness (generalized)     Problem List Patient Active Problem List   Diagnosis Date Noted  . Multiple myeloma without remission ([place])   . B12 deficiency   . Lytic bone lesions on xray   . Anemia   . Hypercalcemia 10/02/2016  . Thrombocytopenia ([place]) 10/02/2016  . Low back pain 10/02/2016  . AKI (acute kidney injury) ([place])   . Acute congestive heart failure ([place])   . Bone mass   . CAD S/P percutaneous coronary angioplasty 07/31/2016  . DOE (dyspnea on exertion)   . Essential hypertension 07/18/2016  . Hyperlipidemia 07/18/2016  . Diabetes ([place]) 07/18/2016  . Chronic atrial fibrillation ([place]) 07/18/2016  . Current use of long term anticoagulation 07/18/2016  . Dyspnea on exertion 07/18/2016  . Cardiomyopathy, ischemic: EF ~25 % by LV Gram 07/18/2016  . Abnormal nuclear stress test  07/18/2016    Otelia Limes, PTA 04/29/2017, 5:18 PM  [place], [place], [address] Phone: [phone]   Fax:  [phone]  Name: Richard Vang MRN: [id number] Date of Birth: [birth year]

## 2017-05-04 ENCOUNTER — Ambulatory Visit: Payer: Medicare HMO | Admitting: Physical Therapy

## 2017-05-07 ENCOUNTER — Telehealth: Payer: Self-pay | Admitting: Hematology

## 2017-05-07 ENCOUNTER — Encounter: Payer: Self-pay | Admitting: Hematology

## 2017-05-07 ENCOUNTER — Other Ambulatory Visit (HOSPITAL_BASED_OUTPATIENT_CLINIC_OR_DEPARTMENT_OTHER): Payer: Medicare HMO

## 2017-05-07 ENCOUNTER — Ambulatory Visit (HOSPITAL_BASED_OUTPATIENT_CLINIC_OR_DEPARTMENT_OTHER): Payer: Medicare HMO | Admitting: Hematology

## 2017-05-07 VITALS — BP 110/77 | HR 74 | Temp 98.6°F | Resp 18 | Ht 70.0 in | Wt 164.4 lb

## 2017-05-07 DIAGNOSIS — D696 Thrombocytopenia, unspecified: Secondary | ICD-10-CM | POA: Diagnosis not present

## 2017-05-07 DIAGNOSIS — E538 Deficiency of other specified B group vitamins: Secondary | ICD-10-CM

## 2017-05-07 DIAGNOSIS — C9000 Multiple myeloma not having achieved remission: Secondary | ICD-10-CM | POA: Diagnosis not present

## 2017-05-07 LAB — CBC & DIFF AND RETIC
BASO%: 0.6 % (ref 0.0–2.0)
Basophils Absolute: 0 10*3/uL (ref 0.0–0.1)
EOS ABS: 0.1 10*3/uL (ref 0.0–0.5)
EOS%: 1.7 % (ref 0.0–7.0)
HCT: 36.4 % — ABNORMAL LOW (ref 38.4–49.9)
HEMOGLOBIN: 11.9 g/dL — AB (ref 13.0–17.1)
IMMATURE RETIC FRACT: 5.5 % (ref 3.00–10.60)
LYMPH%: 15.8 % (ref 14.0–49.0)
MCH: 30.9 pg (ref 27.2–33.4)
MCHC: 32.7 g/dL (ref 32.0–36.0)
MCV: 94.5 fL (ref 79.3–98.0)
MONO#: 0.4 10*3/uL (ref 0.1–0.9)
MONO%: 6.7 % (ref 0.0–14.0)
NEUT%: 75.2 % — ABNORMAL HIGH (ref 39.0–75.0)
NEUTROS ABS: 4 10*3/uL (ref 1.5–6.5)
Platelets: 166 10*3/uL (ref 140–400)
RBC: 3.85 10*6/uL — AB (ref 4.20–5.82)
RDW: 15.7 % — AB (ref 11.0–14.6)
RETIC %: 1.3 % (ref 0.80–1.80)
Retic Ct Abs: 50.05 10*3/uL (ref 34.80–93.90)
WBC: 5.3 10*3/uL (ref 4.0–10.3)
lymph#: 0.8 10*3/uL — ABNORMAL LOW (ref 0.9–3.3)

## 2017-05-07 LAB — COMPREHENSIVE METABOLIC PANEL
ALT: 44 U/L (ref 0–55)
AST: 43 U/L — AB (ref 5–34)
Albumin: 3.4 g/dL — ABNORMAL LOW (ref 3.5–5.0)
Alkaline Phosphatase: 113 U/L (ref 40–150)
Anion Gap: 8 mEq/L (ref 3–11)
BUN: 26.4 mg/dL — AB (ref 7.0–26.0)
CO2: 23 meq/L (ref 22–29)
Calcium: 9.1 mg/dL (ref 8.4–10.4)
Chloride: 112 mEq/L — ABNORMAL HIGH (ref 98–109)
Creatinine: 1.3 mg/dL (ref 0.7–1.3)
EGFR: 58 mL/min/{1.73_m2} — AB (ref >90)
GLUCOSE: 95 mg/dL (ref 70–140)
POTASSIUM: 4.2 meq/L (ref 3.5–5.1)
SODIUM: 143 meq/L (ref 136–145)
TOTAL PROTEIN: 6.2 g/dL — AB (ref 6.4–8.3)
Total Bilirubin: 0.92 mg/dL (ref 0.20–1.20)

## 2017-05-07 NOTE — Telephone Encounter (Signed)
Gave patient ave report and appointments for July and August.

## 2017-05-07 NOTE — Patient Instructions (Signed)
Thank you for choosing [facility] to provide your oncology and hematology care.  To afford each patient quality time with our providers, please arrive 30 minutes before your scheduled appointment time.  If you arrive late for your appointment, you may be asked to reschedule.  We strive to give you quality time with our providers, and arriving late affects you and other patients whose appointments are after yours.   If you are a no show for multiple scheduled visits, you may be dismissed from the clinic at the providers discretion.    Again, thank you for choosing [facility], our hope is that these requests will decrease the amount of time that you wait before being seen by our physicians.  ______________________________________________________________________  Should you have questions after your visit to the [facility], please contact our office at [phone] between the hours of 8:30 and 4:30 p.m.    Voicemails left after 4:30p.m will not be returned until the following business day.    For prescription refill requests, please have your pharmacy contact us directly.  Please also try to allow 48 hours for prescription requests.    Please contact the scheduling department for questions regarding scheduling.  For scheduling of procedures such as PET scans, CT scans, MRI, Ultrasound, etc please contact central scheduling at [phone].    Resources For Cancer Patients and Caregivers:   Oncolink.org:  A wonderful resource for patients and healthcare providers for information regarding your disease, ways to tract your treatment, what to expect, etc.     American Cancer Society:  800-227-2345  Can help patients locate various types of support and financial assistance  Cancer Care: 1-800-813-HOPE (4673) Provides financial assistance, online support groups, medication/co-pay assistance.    [place] DSS:  336-641-3447 Where to apply for food  stamps, Medicaid, and utility assistance  Medicare Rights Center: 800-333-4114 Helps people with Medicare understand their rights and benefits, navigate the Medicare system, and secure the quality healthcare they deserve  SCAT: 336-333-6589 [place] [employer]'s shared-ride transportation service for eligible riders who have a disability that prevents them from riding the fixed route bus.    For additional information on assistance programs please contact our social worker:   Grier Hock/Abigail Elmore:  [phone]            

## 2017-05-08 LAB — KAPPA/LAMBDA LIGHT CHAINS
IG KAPPA FREE LIGHT CHAIN: 43.5 mg/L — AB (ref 3.3–19.4)
IG LAMBDA FREE LIGHT CHAIN: 17.9 mg/L (ref 5.7–26.3)
Kappa/Lambda FluidC Ratio: 2.43 — ABNORMAL HIGH (ref 0.26–1.65)

## 2017-05-08 LAB — VITAMIN B12: Vitamin B12: 529 pg/mL (ref 232–1245)

## 2017-05-10 NOTE — Progress Notes (Signed)
[name]    HEMATOLOGY/ONCOLOGY CLINIC NOTE  Date of Service: .05/07/2017  Patient Care Team: Sandi Mariscal, MD as PCP - General (Internal Medicine)  CHIEF COMPLAINTS/PURPOSE OF CONSULTATION:  f/u for Multiple Myeloma  HISTORY OF PRESENTING ILLNESS:  plz see previous note for details on HPI  INTERVAL HISTORY  Richard Vang is here for his scheduled followup for myeloma along with his wife for reevaluation. His functional status and ambulation have continued to improve with outpatient cancer rehabilitation. He had started Revlimid but developed significant grade 2-3 diarrhea with 10 mg by mouth daily and did not want to continue this any further. It is not open today to restarting it even lower dose. 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 This was a combination of his B12 deficiency and possibly from Velcade though he and his wife notes that he was having the neuropathy in his lower extremities even before myeloma treatment to some extent. He describes this as being a feeling of this freed being swollen then they aren't. Discussed options of watching his disease of maintenance treatment versus consideration of maintenance Velcade every 2 weeks. He and his wife prefer the latter. No other acute new symptoms.   MEDICAL HISTORY:  Past Medical History:  Diagnosis Date  . Arthritis    "left shoulder" (07/31/2016)  . Coronary artery disease   . GERD (gastroesophageal reflux disease)   . Heart murmur   . High cholesterol   . Hypertension   . Multiple myeloma ([place])   . Sleep apnea    "probably; having test in November" (07/31/2016)  . Type II diabetes mellitus ([place])     SURGICAL HISTORY: Past Surgical History:  Procedure Laterality Date  . CARDIAC CATHETERIZATION N/A 07/31/2016   Procedure: Left Heart Cath and Coronary Angiography;  Surgeon: Lorretta Harp, MD;  Location: [place] [facility];  Service: Cardiovascular;  Laterality: N/A;  . CARDIAC CATHETERIZATION N/A 07/31/2016   Procedure:  Coronary Stent Intervention;  Surgeon: Lorretta Harp, MD;  Location: [place] [facility];  Service: Cardiovascular;  Laterality: N/A;  . CORONARY ANGIOPLASTY    . SHOULDER SURGERY Left 1973   "put pin in it where it had separated"   . TONSILLECTOMY  ~ 1956    SOCIAL HISTORY: Social History   Social History  . Marital status: Divorced    Spouse name: N/A  . Number of children: N/A  . Years of education: N/A   Occupational History  . Not on file.   Social History Main Topics  . Smoking status: Never Smoker  . Smokeless tobacco: Never Used  . Alcohol use Yes     Comment: 07/31/2016 "nothing since 2002"  . Drug use: No  . Sexual activity: Not Currently   Other Topics Concern  . Not on file   Social History Narrative  . No narrative on file    FAMILY HISTORY: Family History  Problem Relation Age of Onset  . Hypertension Other     ALLERGIES:  has No Known Allergies.  MEDICATIONS:  Current Outpatient Prescriptions  Medication Sig Dispense Refill  . acyclovir (ZOVIRAX) 400 MG tablet TAKE ONE TABLET BY MOUTH TWICE DAILY 60 tablet 3  . amLODipine (NORVASC) 5 MG tablet Take 5 mg by mouth daily.    [name] atorvastatin (LIPITOR) 40 MG tablet Take 1 tablet (40 mg total) by mouth at bedtime. 30 tablet 12  . carvedilol (COREG) 12.5 MG tablet Take 12.5 mg by mouth 2 (two) times daily with a meal.    .  Cholecalciferol (VITAMIN D3) 5000 units CAPS Take 1 capsule by mouth daily.     . clopidogrel (PLAVIX) 75 MG tablet Take 1 tablet (75 mg total) by mouth daily with breakfast. 30 tablet 12  . Cyanocobalamin (B-12) 1000 MCG SUBL Place 1,000 mcg under the tongue daily. 30 each 3  . escitalopram (LEXAPRO) 5 MG tablet Take 5 mg by mouth daily.    . feeding supplement, ENSURE ENLIVE, (ENSURE ENLIVE) LIQD Take 237 mLs by mouth 2 (two) times daily between meals. 237 mL 12  . furosemide (LASIX) 40 MG tablet     . insulin aspart (NOVOLOG) 100 UNIT/ML injection Correction coverage: Sensitive  (thin, NPO, renal)  CBG < 70: implement hypoglycemia protocol  CBG 70 - 120: 0 units  CBG 121 - 150: 1 unit  CBG 151 - 200: 2 units  CBG 201 - 250: 3 units  CBG 251 - 300: 5 units  CBG 301 - 350: 7 units  CBG 351 - 400 9 units  CBG > 400 call MD and obtain STAT lab verification 10 mL 11  . lactobacillus acidophilus & bulgar (LACTINEX) chewable tablet Chew 1 tablet by mouth 3 (three) times daily with meals. 30 tablet 0  . montelukast (SINGULAIR) 10 MG tablet Take 10 mg by mouth at bedtime.    . nitroGLYCERIN (NITROSTAT) 0.3 MG SL tablet Place 0.3 mg under the tongue as needed for chest pain.    [name] omeprazole (PRILOSEC) 40 MG capsule Take 40 mg by mouth daily.    . ondansetron (ZOFRAN) 8 MG tablet Take 1 tablet (8 mg total) by mouth 2 (two) times daily as needed for refractory nausea / vomiting. Starting on days 4 and 11 not on day 8. 30 tablet 1  . oxyCODONE-acetaminophen (PERCOCET) 10-325 MG tablet Take 1 tablet by mouth 2 (two) times daily.    . polyethylene glycol (MIRALAX / GLYCOLAX) packet Take 17 g by mouth daily.    . potassium chloride SA (K-DUR,KLOR-CON) 20 MEQ tablet Take 1 tablet (20 mEq total) by mouth daily.    [name] warfarin (COUMADIN) 6 MG tablet Take 4 mg by mouth every morning.     . prochlorperazine (COMPAZINE) 10 MG tablet Take 1 tablet (10 mg total) by mouth every 6 (six) hours as needed (Nausea or vomiting). (Patient not taking: Reported on 05/07/2017) 30 tablet 1   No current facility-administered medications for this visit.     REVIEW OF SYSTEMS:    10 Point review of Systems was done is negative except as noted above.  PHYSICAL EXAMINATION: ECOG PERFORMANCE STATUS: 3 - Symptomatic, >50% confined to bed  . Vitals:   05/07/17 1330  BP: 110/77  Pulse: 74  Resp: 18  Temp: 98.6 F (37 C)   Filed Weights   05/07/17 1330  Weight: 164 lb 6.4 oz (74.6 kg)   .Body mass index is 23.59 kg/m.  GENERAL: NAD SKIN: petechiae over upper extremities OROPHARYNX: MMM    NECK: supple,+ JVD, LYMPH: no palpable lymphadenopathy in the cervical, axillary or inguinal LUNGS: CTA b/l HEART: S1-S2 irreg ABDOMEN: abdomen soft, non-tender, normoactive bowel sounds, no hepatosplenomegaly palpable PSYCH: Alert and oriented 3  NEURO: non focal. LE : b/l trace pedal edema   LABORATORY DATA:  I have reviewed the data as listed . CBC Latest Ref Rng & Units 05/07/2017 04/02/2017 03/05/2017  WBC 4.0 - 10.3 10e3/uL 5.3 5.0 4.9  Hemoglobin 13.0 - 17.1 g/dL 11.9(L) 12.3(L) 11.7(L)  Hematocrit 38.4 - 49.9 % 36.4(L) 37.4(L)  35.6(L)  Platelets 140 - 400 10e3/uL 166 147 130(L)   . CBC    Component Value Date/Time   WBC 5.3 05/07/2017 1232   WBC 3.4 (L) 10/08/2016 0234   RBC 3.85 (L) 05/07/2017 1232   RBC 3.05 (L) 10/08/2016 0234   HGB 11.9 (L) 05/07/2017 1232   HCT 36.4 (L) 05/07/2017 1232   PLT 166 05/07/2017 1232   MCV 94.5 05/07/2017 1232   MCH 30.9 05/07/2017 1232   MCH 30.2 10/08/2016 0234   MCHC 32.7 05/07/2017 1232   MCHC 30.4 10/08/2016 0234   RDW 15.7 (H) 05/07/2017 1232   LYMPHSABS 0.8 (L) 05/07/2017 1232   MONOABS 0.4 05/07/2017 1232   EOSABS 0.1 05/07/2017 1232   BASOSABS 0.0 05/07/2017 1232    . CMP Latest Ref Rng & Units 05/07/2017 04/02/2017 03/05/2017  Glucose 70 - 140 mg/dl 95 103 108  BUN 7.0 - 26.0 mg/dL 26.4(H) 27.6(H) 23.3  Creatinine 0.7 - 1.3 mg/dL 1.3 1.2 1.2  Sodium 136 - 145 mEq/L 143 143 143  Potassium 3.5 - 5.1 mEq/L 4.2 4.0 4.2  Chloride 101 - 111 mmol/L - - -  CO2 22 - 29 mEq/L _0 Calcium 8.4 - 10.4 mg/dL 9.1 9.4 9.2  Total Protein 6.4 - 8.3 g/dL 6.2(L) 6.1(L) 5.7(L)  Total Bilirubin 0.20 - 1.20 mg/dL 0.92 0.76 0.67  Alkaline Phos 40 - 150 U/L 113 166(H) 99  AST 5 - 34 U/L 43(H) 129(H) 25  ALT 0 - 55 U/L 44 134(H) 24      RADIOGRAPHIC STUDIES: I have personally reviewed the radiological images as listed and agreed with the findings in the report. No results found.  ASSESSMENT & PLAN:   71 year old male  with multiple medical co-morbidities including hypertension, diabetes, dyslipidemia, coronary artery disease status post drug-eluting PCI on 07/31/2016 and newly noted possibly ischemic cardiomyopathy ejection fraction 25-35% (rpt ECHO improvement to 55-60%) with   1) Light chain Multiple myeloma with Lytic lesion with aggressive features in the right iliac bone and possibly other lytic lesions in the L spine , anemia, hypercalcemia and renal insuff (diagnosed in 09/2016) bone marrow bx -- shows 67%-80 %plasma cells consistent with multiple myeloma.  K/L 95, No M spike on SPEP -- suggests light chain MM Cytogenetics - normal male chromosomes FISH- +11 and 13q-/-13  Patient is s/p 1 cycle of Vd and Serum free kappa LC has decreased from 700 to 203.6 with improvement in his K/L ratio from 94.59 to 29. Completed 2nd cycle of treatment with Vd + Cytoxan (227m/m2) Completed 3rd cycle of treatment with Vd + Cytoxan (2065mm2) - cytoxan held D15 due to thrombocytopenia Completed 4th of VCd Discontinued VCd after C5D8 due to intolerance with diarrhea and increasing neuropathy and fatigue with borderline functional status. Plan -Patient did not tolerate maintenance Revlimid 10 mg by mouth daily due to grade 2-3 diarrhea. This was discontinued and his diarrhea has resolved. -He was given the options of holding off on any treatment and monitoring versus consideration of maintenance Velcade every 2 weeks and he prefers the latter option. -He will be scheduled for every 2 weekly Velcade with premedications. -Continue B12 replacement -Continue acyclovir for shingles prophylaxis. -Continue aspirin -continue Pamidronate q4weeks -We will need to monitor closely for worsening neuropathy. -Continue outpatient cancer rehabilitation  2) CAD s/p prox LAD DES PCI on 07/31/2016 with ischemic cardiomyopathy ejection fraction of 25-35%. Rpt ECHO on 10/03/2016 shows improvement in EF to 55-60% with no RWMABN.  3)  Macrocytic Anemia with moderate thrombocytopenia - due to MM and B12 def hgb stable around 12  5) Thrombocytopenia - due to MM and treatment --resolved    6) B12 deficiency - Received [place] B12 daily while in the hospital. B12 levels today 529 Plan -cont SL B12 1074mg po daily  6) b/l Lower extremity swelling likely due to CHF an CKD. Much improved. Plan -continued adjustment and monitoring of diuretic therapy as per PCP and cardiology  .7) CAD s/p recent DES 8) h/o Afib with RVR on coumadin -continue mx per PCP and cardiology  9) Decubitus Ulcers on buttocks -healed -wound care RN mx this.  10) diarrhea - muc improved. Stool studies for GI pathogen panel and C. Difficile neg S/p empiric treatment for c diff with po vancomycin Plan Resolved off Revlimid.  11) Elevated Alkaline phosphatase, AST, ALT. No abdominal pain no fevers. Previous CT abd in 09/2016 has shown signs of liver cirrhosis, choledocholithiasis.  ?hepatic congestion from CHF Plan -resolved on rpt labs today -monitor  -Starting on maintenance Velcade q2weeks -continue Pamidronate q4weeks -RTC with Dr KIrene Limboin 4 weeks with labs  All of the patients questions were answered with apparent satisfaction. The patient knows to call the clinic with any problems, questions or concerns.  I spent 20 minutes counseling the patient face to face. The total time spent in the appointment was 30 minutes and more than 50% was on counseling and direct patient cares.    GSullivan LoneMD MCarrolltonAAHIVMS SRedwood Surgery CenterCHolland Eye Clinic PcHematology/Oncology Physician [facility] (Office):       [phone](Work cell):  [phone](Fax):           [phone]

## 2017-05-11 ENCOUNTER — Ambulatory Visit: Payer: Medicare HMO | Admitting: Physical Therapy

## 2017-05-11 VITALS — HR 70

## 2017-05-11 DIAGNOSIS — R262 Difficulty in walking, not elsewhere classified: Secondary | ICD-10-CM

## 2017-05-11 DIAGNOSIS — G8929 Other chronic pain: Secondary | ICD-10-CM

## 2017-05-11 DIAGNOSIS — R293 Abnormal posture: Secondary | ICD-10-CM

## 2017-05-11 DIAGNOSIS — M6281 Muscle weakness (generalized): Secondary | ICD-10-CM

## 2017-05-11 DIAGNOSIS — M545 Low back pain: Secondary | ICD-10-CM

## 2017-05-11 NOTE — Therapy (Signed)
[place], [place], [address] Phone: [phone]   Fax:  [phone]  Physical Therapy Treatment  Patient Details  Name: Richard Vang MRN: [id number] Date of Birth: 1945-11-27 Referring Provider: Dr. Sullivan Lone  Encounter Date: 05/11/2017      PT End of Session - 05/11/17 1715    Visit Number 16   Number of Visits 30   Date for PT Re-Evaluation 06/26/17   PT Start Time 9485   PT Stop Time 1559   PT Time Calculation (min) 45 min   Activity Tolerance Patient tolerated treatment well;Other (comment)  more SOB than previously, so required more rest breaks   Behavior During Therapy [facility] for tasks assessed/performed      Past Medical History:  Diagnosis Date  . Arthritis    "left shoulder" (07/31/2016)  . Coronary artery disease   . GERD (gastroesophageal reflux disease)   . Heart murmur   . High cholesterol   . Hypertension   . Multiple myeloma ([place])   . Sleep apnea    "probably; having test in November" (07/31/2016)  . Type II diabetes mellitus ([place])     Past Surgical History:  Procedure Laterality Date  . CARDIAC CATHETERIZATION N/A 07/31/2016   Procedure: Left Heart Cath and Coronary Angiography;  Surgeon: Lorretta Harp, MD;  Location: [facility];  Service: Cardiovascular;  Laterality: N/A;  . CARDIAC CATHETERIZATION N/A 07/31/2016   Procedure: Coronary Stent Intervention;  Surgeon: Lorretta Harp, MD;  Location: [facility];  Service: Cardiovascular;  Laterality: N/A;  . CORONARY ANGIOPLASTY    . SHOULDER SURGERY Left 1973   "put pin in it where it had separated"   . TONSILLECTOMY  ~ 1956    Vitals:   05/11/17 1514  Pulse: 70  SpO2: 97%        Subjective Assessment - 05/11/17 1514    Subjective I've got to start that chemotherapy back on Friday. "When my back ain't hurtin' I can walk good on a cane." Walked some at the house with the cane and he feels  safe. Went up 12 or 13 steps at his brother-in-law's this weekend.    Currently in Pain? Yes   Pain Location Back   Pain Orientation Lower   Aggravating Factors  nothing--maybe it's the weather   Pain Relieving Factors pain pills maybe                         [facility] Adult PT Treatment/Exercise - 05/11/17 0001      Ambulation/Gait   Gait Comments walked approx. 120 feet with walker to stairs, then patient went up/down 4 steps with both railings, step-through pattern, then with one railing step-through going up and step-to going down (railing on left), then 120 feet back to starting point  stopped after stairs and then after walking back for SOB     Neuro Re-ed    Neuro Re-ed Details  standing with back to corner on Airex foam pad, eyes open, then eyes closed x approxl 30 seconds with close supervision; then eyes open and stepping in place; standing with back in corner on floor, ball toss and catch     Elbow Exercises   Bar Weights/Barbell (Elbow Flexion) 4 lbs  10 reps x 2   Other elbow exercises countertop pushups x 10 x 2     Knee/Hip Exercises: Standing   Wall Squat 10 reps;2 sets  ball against wall, back against ball   Other Standing Knee Exercises standing with back in corner, touch foot up on 6 inch step, alternating x 10 each   Other Standing Knee Exercises stand without UE support, turn one way 360 degrees, then the other way 360 degrees     Shoulder Exercises: Therapy Ball   Flexion 5 reps  2 lbs. weights on wrists     Ankle Exercises: Standing   Heel Raises 1 second  UE support on back of bike seat 30x + 10x                   Short Term Clinic Goals - 04/27/17 1529      CC Short Term Goal  #1   Title Patient will be independent with initial home exercise program   Status Achieved     CC Short Term Goal  #2   Title Pt. will demonstrate erect posture when walking in his walker for at least 50 feet.   Status Achieved              Long Term Clinic Goals - 05/11/17 1714      CC Long Term Goal  #2   Title Pt. will be able to negotiate three steps independently, safely, so that he can get in and out of his house on his own.   Status Achieved            Plan - 05/11/17 1716    Clinical Impression Statement Pt. continues to do what he's asked to do.  Today he seemed more SOB than previously, though O2 saturation after a number of exercises was 97%.  He was given more frequent rest breaks today and was asked to check with his cardiologist about apparent increase in shortness of breath.  He agreed.   Rehab Potential Good   PT Frequency 2x / week   PT Duration 4 weeks   PT Treatment/Interventions ADLs/Self Care Home Management;Electrical Stimulation;Moist Heat;Gait training;DME Instruction;Therapeutic exercise;Balance training;Patient/family education;Manual techniques   PT Next Visit Plan Continue functional strengthening, balance, and endurance.  See if he talked to his cardiologist about shortness of breath.   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 Multiple myeloma without remission ([place])   . B12 deficiency   . Lytic bone lesions on xray   . Anemia   . Hypercalcemia 10/02/2016  . Thrombocytopenia ([place]) 10/02/2016  . Low back pain 10/02/2016  . AKI (acute kidney injury) ([place])   . Acute congestive heart failure ([place])   . Bone mass   . CAD S/P percutaneous coronary angioplasty 07/31/2016  . DOE (dyspnea on exertion)   . Essential hypertension  07/18/2016  . Hyperlipidemia 07/18/2016  . Diabetes ([place]) 07/18/2016  . Chronic atrial fibrillation ([place]) 07/18/2016  . Current use of long term anticoagulation 07/18/2016  . Dyspnea on exertion 07/18/2016  . Cardiomyopathy, ischemic: EF ~25 % by LV Gram 07/18/2016  . Abnormal nuclear stress test 07/18/2016    SALISBURY,DONNA 05/11/2017, 5:19 PM  [place]  [place], [place], [address] Phone: [phone]   Fax:  [phone]  Name: KAMSIYOCHUKWU SPICKLER MRN: [id number] Date of Birth: Nov 17, 1945  Serafina Royals, PT 05/11/17 5:19 PM

## 2017-05-13 ENCOUNTER — Ambulatory Visit: Payer: Medicare HMO | Admitting: Physical Therapy

## 2017-05-13 ENCOUNTER — Encounter: Payer: Self-pay | Admitting: Physical Therapy

## 2017-05-13 DIAGNOSIS — R262 Difficulty in walking, not elsewhere classified: Secondary | ICD-10-CM | POA: Diagnosis not present

## 2017-05-13 DIAGNOSIS — G8929 Other chronic pain: Secondary | ICD-10-CM

## 2017-05-13 DIAGNOSIS — M545 Low back pain, unspecified: Secondary | ICD-10-CM

## 2017-05-13 DIAGNOSIS — R293 Abnormal posture: Secondary | ICD-10-CM

## 2017-05-13 DIAGNOSIS — M6281 Muscle weakness (generalized): Secondary | ICD-10-CM

## 2017-05-13 NOTE — Therapy (Signed)
[place], [place], [address] Phone: [phone]   Fax:  [phone]  Physical Therapy Treatment  Patient Details  Name: Richard Vang MRN: [id number] Date of Birth: 03-14-1946 Referring Provider: Dr. Sullivan Lone  Encounter Date: 05/13/2017      PT End of Session - 05/13/17 1658    Visit Number 17   Number of Visits 30   Date for PT Re-Evaluation 06/26/17   PT Start Time 1518   PT Stop Time 1557   PT Time Calculation (min) 39 min   Equipment Utilized During Treatment Gait belt   Activity Tolerance Patient tolerated treatment well   Behavior During Therapy [facility] for tasks assessed/performed      Past Medical History:  Diagnosis Date  . Arthritis    "left shoulder" (07/31/2016)  . Coronary artery disease   . GERD (gastroesophageal reflux disease)   . Heart murmur   . High cholesterol   . Hypertension   . Multiple myeloma ([place])   . Sleep apnea    "probably; having test in November" (07/31/2016)  . Type II diabetes mellitus ([place])     Past Surgical History:  Procedure Laterality Date  . CARDIAC CATHETERIZATION N/A 07/31/2016   Procedure: Left Heart Cath and Coronary Angiography;  Surgeon: Lorretta Harp, MD;  Location: [facility];  Service: Cardiovascular;  Laterality: N/A;  . CARDIAC CATHETERIZATION N/A 07/31/2016   Procedure: Coronary Stent Intervention;  Surgeon: Lorretta Harp, MD;  Location: [facility];  Service: Cardiovascular;  Laterality: N/A;  . CORONARY ANGIOPLASTY    . SHOULDER SURGERY Left 1973   "put pin in it where it had separated"   . TONSILLECTOMY  ~ 1956    There were no vitals filed for this visit.      Subjective Assessment - 05/13/17 1521    Subjective My back is really killing me today. I am going to start back on chemotherapy on Friday. The cardiologist wasn't there so I have not seen him yet about my shortness of breath.    Pertinent History  Light chain Multiple myeloma with Lytic lesion with aggressive features in the right iliac bone and possibly other lytic lesions in the L spine , anemia, hypercalcemia and renal insuff (diagnosed in 09/2016)  Not on active treatment now, but trying a drug approved for maintenance.  hypertension, diabetes, dyslipidemia, coronary artery disease.    Patient Stated Goals to walk without falling, go up and down the steps to walk in the yard; get my back to quit hurting 04/23/2017  He states he is not falling anymore and is walking in his yard    Currently in Pain? Yes   Pain Score 8    Pain Location Back   Pain Orientation Lower   Pain Descriptors / Indicators Aching   Pain Type Chronic pain            [facility] PT Assessment - 05/13/17 0001      Ambulation/Gait   Gait Comments walked approx. 120 feet with walker to stairs, then patient went up/down 4 steps with both railings, step-through pattern, then with one railing step-through going up and step-to going down (railing on left), then 120 feet back to starting point  pt did not require a recovery period this session                     [facility] Adult PT Treatment/Exercise - 05/13/17 0001  Neuro Re-ed    Neuro Re-ed Details  all standing with back to corner on Airex foam pad, eyes open, then eyes closed x approxl 30 seconds with close supervision; then eyes open and stepping in place; standing with back in corner on floor, ball toss and catch  pt demonstrates good balance during ball toss today     Elbow Exercises   Elbow Flexion Strengthening;Both;10 reps;Seated   Bar Weights/Barbell (Elbow Flexion) 4 lbs   Other elbow exercises wall pushups x 10 x 2     Knee/Hip Exercises: Standing   Wall Squat 10 reps;2 sets   Other Standing Knee Exercises standing with back in corner, touch foot up on 6 inch step, alternating x 6 each  pt became more short of breath with this exercise   Other Standing Knee Exercises stand without UE  support, turn one way 360 degrees, then the other way 360 degrees     Shoulder Exercises: Seated   Abduction Strengthening;Both;10 reps;Weights   ABduction Weight (lbs) 1  through partial ROM only   Other Seated Exercises scaption, bilat. 1 lb. each hand x 10  difficult, through partial ROM only     Shoulder Exercises: Therapy Ball   Flexion 5 reps  x 2 sets, 2 lbs. weights on wrists     Ankle Exercises: Standing   Heel Raises 1 second  UE support on back of bike seat 30x + 10x                   Short Term Clinic Goals - 04/27/17 1529      CC Short Term Goal  #1   Title Patient will be independent with initial home exercise program   Status Achieved     CC Short Term Goal  #2   Title Pt. will demonstrate erect posture when walking in his walker for at least 50 feet.   Status Achieved             Long Term Clinic Goals - 05/11/17 1714      CC Long Term Goal  #2   Title Pt. will be able to negotiate three steps independently, safely, so that he can get in and out of his house on his own.   Status Achieved            Plan - 05/13/17 1658    Clinical Impression Statement Pt was more short of breath this session during his exercises. His 02 sats were 98%. He has to be educated to sit down and recover when he becomes very short of breath. He plans on talking to his cardiologist. Pt's balance is improving and pt was able to stand on foam and catch ball with only close supervision today. He is tolerating higher level balance and strengthening exercises despite his increased shortness of breath.    Rehab Potential Good   Clinical Impairments Affecting Rehab Potential no weighted or resisted exercises due to nature of disease   PT Frequency 2x / week   PT Duration 4 weeks   PT Treatment/Interventions ADLs/Self Care Home Management;Electrical Stimulation;Moist Heat;Gait training;DME Instruction;Therapeutic exercise;Balance training;Patient/family education;Manual  techniques   PT Next Visit Plan Continue functional strengthening, balance, and endurance.  See if he talked to his cardiologist about shortness of breath.   Consulted and Agree with Plan of Care Patient      Patient will benefit from skilled therapeutic intervention in order to improve the following deficits and impairments:  Abnormal gait, Decreased activity tolerance,  Decreased balance, Decreased strength, Pain  Visit Diagnosis: Difficulty in walking, not elsewhere classified  Chronic bilateral low back pain without sciatica  Abnormal posture  Muscle weakness (generalized)     Problem List Patient Active Problem List   Diagnosis Date Noted  . Multiple myeloma without remission ([place])   . B12 deficiency   . Lytic bone lesions on xray   . Anemia   . Hypercalcemia 10/02/2016  . Thrombocytopenia ([place]) 10/02/2016  . Low back pain 10/02/2016  . AKI (acute kidney injury) ([place])   . Acute congestive heart failure ([place])   . Bone mass   . CAD S/P percutaneous coronary angioplasty 07/31/2016  . DOE (dyspnea on exertion)   . Essential hypertension 07/18/2016  . Hyperlipidemia 07/18/2016  . Diabetes ([place]) 07/18/2016  . Chronic atrial fibrillation ([place]) 07/18/2016  . Current use of long term anticoagulation 07/18/2016  . Dyspnea on exertion 07/18/2016  . Cardiomyopathy, ischemic: EF ~25 % by LV Gram 07/18/2016  . Abnormal nuclear stress test 07/18/2016    Allyson Sabal [facility] 05/13/2017, 5:01 PM  [place], [place], [address] Phone: [phone]   Fax:  [phone]  Name: PHILANDER AKE MRN: [id number] Date of Birth: 23-Dec-1945  Manus Gunning, PT 05/13/17 5:01 PM

## 2017-05-15 ENCOUNTER — Telehealth: Payer: Self-pay

## 2017-05-15 ENCOUNTER — Ambulatory Visit (HOSPITAL_BASED_OUTPATIENT_CLINIC_OR_DEPARTMENT_OTHER): Payer: Medicare HMO

## 2017-05-15 ENCOUNTER — Other Ambulatory Visit: Payer: Self-pay | Admitting: Hematology

## 2017-05-15 VITALS — BP 117/65 | HR 61 | Temp 98.4°F | Resp 18

## 2017-05-15 DIAGNOSIS — C9000 Multiple myeloma not having achieved remission: Secondary | ICD-10-CM

## 2017-05-15 DIAGNOSIS — Z5112 Encounter for antineoplastic immunotherapy: Secondary | ICD-10-CM | POA: Diagnosis not present

## 2017-05-15 LAB — MULTIPLE MYELOMA PANEL, SERUM
ALBUMIN/GLOB SERPL: 1.2 (ref 0.7–1.7)
ALPHA2 GLOB SERPL ELPH-MCNC: 0.6 g/dL (ref 0.4–1.0)
Albumin SerPl Elph-Mcnc: 3.2 g/dL (ref 2.9–4.4)
Alpha 1: 0.2 g/dL (ref 0.0–0.4)
B-GLOBULIN SERPL ELPH-MCNC: 0.8 g/dL (ref 0.7–1.3)
Gamma Glob SerPl Elph-Mcnc: 1 g/dL (ref 0.4–1.8)
Globulin, Total: 2.7 g/dL (ref 2.2–3.9)
IGG (IMMUNOGLOBIN G), SERUM: 953 mg/dL (ref 700–1600)
IGM (IMMUNOGLOBIN M), SRM: 87 mg/dL (ref 20–172)
IgA, Qn, Serum: 72 mg/dL (ref 61–437)
TOTAL PROTEIN: 5.9 g/dL — AB (ref 6.0–8.5)

## 2017-05-15 MED ORDER — PROCHLORPERAZINE MALEATE 10 MG PO TABS
ORAL_TABLET | ORAL | Status: AC
  Filled 2017-05-15: qty 1

## 2017-05-15 MED ORDER — BORTEZOMIB CHEMO SQ INJECTION 3.5 MG (2.5MG/ML)
1.3000 mg/m2 | Freq: Once | INTRAMUSCULAR | Status: AC
  Administered 2017-05-15: 2.5 mg via SUBCUTANEOUS
  Filled 2017-05-15: qty 2.5

## 2017-05-15 MED ORDER — PROCHLORPERAZINE MALEATE 10 MG PO TABS
10.0000 mg | ORAL_TABLET | Freq: Once | ORAL | Status: AC
  Administered 2017-05-15: 10 mg via ORAL

## 2017-05-15 MED ORDER — SODIUM CHLORIDE 0.9 % IV SOLN
60.0000 mg | Freq: Once | INTRAVENOUS | Status: AC
  Administered 2017-05-15: 60 mg via INTRAVENOUS
  Filled 2017-05-15: qty 20

## 2017-05-15 MED ORDER — SODIUM CHLORIDE 0.9 % IV SOLN
Freq: Once | INTRAVENOUS | Status: AC
  Administered 2017-05-15: 13:00:00 via INTRAVENOUS

## 2017-05-15 NOTE — Progress Notes (Signed)
Okay to treat with 05/07/17 labs per [facility] RN per Dr. Irene Limbo.

## 2017-05-15 NOTE — Patient Instructions (Signed)
[facility] Discharge Instructions for Patients Receiving Chemotherapy  Today you received the following chemotherapy agents: Velcade  To help prevent nausea and vomiting after your treatment, we encourage you to take your nausea medication as directed.    If you develop nausea and vomiting that is not controlled by your nausea medication, call the clinic.   BELOW ARE SYMPTOMS THAT SHOULD BE REPORTED IMMEDIATELY:  *FEVER GREATER THAN 100.5 F  *CHILLS WITH OR WITHOUT FEVER  NAUSEA AND VOMITING THAT IS NOT CONTROLLED WITH YOUR NAUSEA MEDICATION  *UNUSUAL SHORTNESS OF BREATH  *UNUSUAL BRUISING OR BLEEDING  TENDERNESS IN MOUTH AND THROAT WITH OR WITHOUT PRESENCE OF ULCERS  *URINARY PROBLEMS  *BOWEL PROBLEMS  UNUSUAL RASH Items with * indicate a potential emergency and should be followed up as soon as possible.  Feel free to call the clinic you have any questions or concerns. The clinic phone number is [phone].  Please show the CHEMO ALERT CARD at check-in to the Emergency Department and triage nurse.   Pamidronate injection What is this medicine? PAMIDRONATE (pa mi DROE nate) slows calcium loss from bones. It is used to treat high calcium blood levels from cancer or Paget's disease. It is also used to treat bone pain and prevent fractures from certain cancers that have spread to the bone. This medicine may be used for other purposes; ask your health care provider or pharmacist if you have questions. COMMON BRAND NAME(S): Aredia What should I tell my health care provider before I take this medicine? They need to know if you have any of these conditions: -aspirin-sensitive asthma -dental disease -kidney disease -an unusual or allergic reaction to pamidronate, other medicines, foods, dyes, or preservatives -pregnant or trying to get pregnant -breast-feeding How should I use this medicine? This medicine is for infusion into a vein. It is given by a health  care professional in a hospital or clinic setting. Talk to your pediatrician regarding the use of this medicine in children. This medicine is not approved for use in children. Overdosage: If you think you have taken too much of this medicine contact a poison control center or emergency room at once. NOTE: This medicine is only for you. Do not share this medicine with others. What if I miss a dose? This does not apply. What may interact with this medicine? -certain antibiotics given by injection -medicines for inflammation or pain like ibuprofen, naproxen -some diuretics like bumetanide, furosemide -cyclosporine -parathyroid hormone -tacrolimus -teriparatide -thalidomide This list may not describe all possible interactions. Give your health care provider a list of all the medicines, herbs, non-prescription drugs, or dietary supplements you use. Also tell them if you smoke, drink alcohol, or use illegal drugs. Some items may interact with your medicine. What should I watch for while using this medicine? Visit your doctor or health care professional for regular checkups. It may be some time before you see the benefit from this medicine. Do not stop taking your medicine unless your doctor tells you to. Your doctor may order blood tests or other tests to see how you are doing. Women should inform their doctor if they wish to become pregnant or think they might be pregnant. There is a potential for serious side effects to an unborn child. Talk to your health care professional or pharmacist for more information. You should make sure that you get enough calcium and vitamin D while you are taking this medicine. Discuss the foods you eat and the vitamins you take   with your health care professional. Some people who take this medicine have severe bone, joint, and/or muscle pain. This medicine may also increase your risk for a broken thigh bone. Tell your doctor right away if you have pain in your upper leg  or groin. Tell your doctor if you have any pain that does not go away or that gets worse. What side effects may I notice from receiving this medicine? Side effects that you should report to your doctor or health care professional as soon as possible: -allergic reactions like skin rash, itching or hives, swelling of the face, lips, or tongue -black or tarry stools -changes in vision -eye inflammation, pain -high blood pressure -jaw pain, especially burning or cramping -muscle weakness -numb, tingling pain -swelling of feet or hands -trouble passing urine or change in the amount of urine -unable to move easily Side effects that usually do not require medical attention (report to your doctor or health care professional if they continue or are bothersome): -bone, joint, or muscle pain -constipation -dizzy, drowsy -fever -headache -loss of appetite -nausea, vomiting -pain at site where injected This list may not describe all possible side effects. Call your doctor for medical advice about side effects. You may report side effects to FDA at 1-800-FDA-1088. Where should I keep my medicine? This drug is given in a hospital or clinic and will not be stored at home. NOTE: This sheet is a summary. It may not cover all possible information. If you have questions about this medicine, talk to your doctor, pharmacist, or health care provider.  2018 Elsevier/Gold Standard (2011-04-04 08:49:49)  

## 2017-05-15 NOTE — Telephone Encounter (Signed)
Per RN query, orders are in for pt to receive aredia and velcade today. Okay to use labs from 7/19. Please relay to pt Dr. Irene Limbo recommends pt f/u with PCP for further evaluation of abnormal LFT's.

## 2017-05-18 ENCOUNTER — Ambulatory Visit: Payer: Medicare HMO | Admitting: Physical Therapy

## 2017-05-18 ENCOUNTER — Encounter: Payer: Self-pay | Admitting: Physical Therapy

## 2017-05-18 ENCOUNTER — Other Ambulatory Visit: Payer: Self-pay

## 2017-05-18 DIAGNOSIS — M545 Low back pain: Secondary | ICD-10-CM

## 2017-05-18 DIAGNOSIS — G8929 Other chronic pain: Secondary | ICD-10-CM

## 2017-05-18 DIAGNOSIS — R262 Difficulty in walking, not elsewhere classified: Secondary | ICD-10-CM

## 2017-05-18 DIAGNOSIS — M6281 Muscle weakness (generalized): Secondary | ICD-10-CM

## 2017-05-18 DIAGNOSIS — R293 Abnormal posture: Secondary | ICD-10-CM

## 2017-05-18 NOTE — Therapy (Signed)
[place], [place], [address] Phone: [phone]   Fax:  [phone]  Physical Therapy Treatment  Patient Details  Name: Richard Vang MRN: [id number] Date of Birth: 05-06-1946 Referring Provider: Dr. Sullivan Lone  Encounter Date: 05/18/2017      PT End of Session - 05/18/17 1600    Visit Number 18   Number of Visits 30   Date for PT Re-Evaluation 06/26/17   PT Start Time 4801   PT Stop Time 1600   PT Time Calculation (min) 37 min   Equipment Utilized During Treatment Gait belt   Activity Tolerance Patient tolerated treatment well   Behavior During Therapy [facility] for tasks assessed/performed      Past Medical History:  Diagnosis Date  . Arthritis    "left shoulder" (07/31/2016)  . Coronary artery disease   . GERD (gastroesophageal reflux disease)   . Heart murmur   . High cholesterol   . Hypertension   . Multiple myeloma ([place])   . Sleep apnea    "probably; having test in November" (07/31/2016)  . Type II diabetes mellitus ([place])     Past Surgical History:  Procedure Laterality Date  . CARDIAC CATHETERIZATION N/A 07/31/2016   Procedure: Left Heart Cath and Coronary Angiography;  Surgeon: Lorretta Harp, MD;  Location: [place] [facility];  Service: Cardiovascular;  Laterality: N/A;  . CARDIAC CATHETERIZATION N/A 07/31/2016   Procedure: Coronary Stent Intervention;  Surgeon: Lorretta Harp, MD;  Location: [place] [facility];  Service: Cardiovascular;  Laterality: N/A;  . CORONARY ANGIOPLASTY    . SHOULDER SURGERY Left 1973   "put pin in it where it had separated"   . TONSILLECTOMY  ~ 1956    There were no vitals filed for this visit.      Subjective Assessment - 05/18/17 1525    Subjective I was tired after last session. I have not found anything out from the cardiologist yet. I will have some tests run on Thursday.    Pertinent History Light chain Multiple myeloma with Lytic lesion  with aggressive features in the right iliac bone and possibly other lytic lesions in the L spine , anemia, hypercalcemia and renal insuff (diagnosed in 09/2016)  Not on active treatment now, but trying a drug approved for maintenance.  hypertension, diabetes, dyslipidemia, coronary artery disease.    Patient Stated Goals to walk without falling, go up and down the steps to walk in the yard; get my back to quit hurting 04/23/2017  He states he is not falling anymore and is walking in his yard    Currently in Pain? Yes   Pain Score 7    Pain Location Back   Pain Orientation Lower   Pain Descriptors / Indicators Aching   Pain Type Chronic pain   Pain Onset More than a month ago                         [facility] Adult PT Treatment/Exercise - 05/18/17 0001      Ambulation/Gait   Gait Comments walked approx. 120 feet with walker to stairs, then patient went up/down 4 steps oen railing step-through going up and step-to going down (railing on left) x 3, then 120 feet back to starting point  pt did not require a recovery period this session     Neuro Re-ed    Neuro Re-ed Details  all standing with back to corner on  Airex foam pad, eyes open, then eyes closed x approxl 30 seconds with CGA- pt had to sit after this due to dizziness from eyes closed; then eyes open and stepping in place; standing with back in corner on floor, ball toss and catch  pt demonstrates good balance during ball toss today     Elbow Exercises   Elbow Flexion Strengthening;Both;10 reps;Seated   Bar Weights/Barbell (Elbow Flexion) 3 lbs  decreased to 3 due to overactivation of upper traps   Other elbow exercises wall pushups x 10 x 2     Knee/Hip Exercises: Standing   Wall Squat 10 reps;2 sets   Other Standing Knee Exercises standing with back in corner, touch foot up on 6 inch step, alternating x 10 each  pt became more short of breath with this exercise   Other Standing Knee Exercises stand without UE support,  turn one way 360 degrees, then the other way 360 degrees  x 2     Shoulder Exercises: Seated   Abduction Strengthening;Both;10 reps;Weights   ABduction Weight (lbs) 1  through partial ROM only   Other Seated Exercises scaption, bilat. 1 lb. each hand x 10  difficult, through partial ROM only     Shoulder Exercises: Therapy Ball   Flexion 10 reps  with no weights due to shortness of breath     Ankle Exercises: Standing   Heel Raises 1 second  UE support on back of bike seat 40x                   Short Term Clinic Goals - 04/27/17 1529      CC Short Term Goal  #1   Title Patient will be independent with initial home exercise program   Status Achieved     CC Short Term Goal  #2   Title Pt. will demonstrate erect posture when walking in his walker for at least 50 feet.   Status Achieved             Long Term Clinic Goals - 05/11/17 1714      CC Long Term Goal  #2   Title Pt. will be able to negotiate three steps independently, safely, so that he can get in and out of his house on his own.   Status Achieved            Plan - 05/18/17 1601    Clinical Impression Statement Patient more short of breath this session compared to last. Pt states he feels fine and tries to push through shortness of breath but has to be educated to slow down and take seated recovery periods. He has not been checked out by his cardioogist yet. He will have some tests run on Thursday. He was able to complete all of his exercises today. O2 sats were 98%.    Rehab Potential Good   Clinical Impairments Affecting Rehab Potential no weighted or resisted exercises due to nature of disease   PT Frequency 2x / week   PT Duration 4 weeks   PT Treatment/Interventions ADLs/Self Care Home Management;Electrical Stimulation;Moist Heat;Gait training;DME Instruction;Therapeutic exercise;Balance training;Patient/family education;Manual techniques   PT Next Visit Plan Continue functional  strengthening, balance, and endurance.  See if he talked to his cardiologist about shortness of breath.   Consulted and Agree with Plan of Care Patient      Patient will benefit from skilled therapeutic intervention in order to improve the following deficits and impairments:  Abnormal gait, Decreased activity tolerance, Decreased  balance, Decreased strength, Pain  Visit Diagnosis: Difficulty in walking, not elsewhere classified  Chronic bilateral low back pain without sciatica  Abnormal posture  Muscle weakness (generalized)     Problem List Patient Active Problem List   Diagnosis Date Noted  . Multiple myeloma without remission ([place])   . B12 deficiency   . Lytic bone lesions on xray   . Anemia   . Hypercalcemia 10/02/2016  . Thrombocytopenia ([place]) 10/02/2016  . Low back pain 10/02/2016  . AKI (acute kidney injury) ([place])   . Acute congestive heart failure ([place])   . Bone mass   . CAD S/P percutaneous coronary angioplasty 07/31/2016  . DOE (dyspnea on exertion)   . Essential hypertension 07/18/2016  . Hyperlipidemia 07/18/2016  . Diabetes ([place]) 07/18/2016  . Chronic atrial fibrillation ([place]) 07/18/2016  . Current use of long term anticoagulation 07/18/2016  . Dyspnea on exertion 07/18/2016  . Cardiomyopathy, ischemic: EF ~25 % by LV Gram 07/18/2016  . Abnormal nuclear stress test 07/18/2016    Allyson Sabal [facility] 05/18/2017, 4:05 PM  [place], [place], [address] Phone: [phone]   Fax:  [phone]  Name: Richard Vang MRN: [id number] Date of Birth: 19-Oct-1946  Manus Gunning, PT 05/18/17 4:05 PM

## 2017-05-20 ENCOUNTER — Ambulatory Visit: Payer: Medicare HMO | Attending: Hematology

## 2017-05-20 DIAGNOSIS — M545 Low back pain, unspecified: Secondary | ICD-10-CM

## 2017-05-20 DIAGNOSIS — M6281 Muscle weakness (generalized): Secondary | ICD-10-CM | POA: Insufficient documentation

## 2017-05-20 DIAGNOSIS — R293 Abnormal posture: Secondary | ICD-10-CM | POA: Diagnosis present

## 2017-05-20 DIAGNOSIS — R262 Difficulty in walking, not elsewhere classified: Secondary | ICD-10-CM | POA: Diagnosis present

## 2017-05-20 DIAGNOSIS — G8929 Other chronic pain: Secondary | ICD-10-CM | POA: Diagnosis present

## 2017-05-20 NOTE — Therapy (Signed)
[place], [place], [address] Phone: [phone]   Fax:  [phone]  Physical Therapy Treatment  Patient Details  Name: Richard Vang MRN: [id number] Date of Birth: 04/17/1946 Referring Provider: Dr. Sullivan Lone  Encounter Date: 05/20/2017      PT End of Session - 05/20/17 1612    Visit Number 19   Number of Visits 30   Date for PT Re-Evaluation 06/26/17   PT Start Time 3825   PT Stop Time 1606   PT Time Calculation (min) 45 min   Activity Tolerance Patient tolerated treatment well   Behavior During Therapy [facility] for tasks assessed/performed      Past Medical History:  Diagnosis Date  . Arthritis    "left shoulder" (07/31/2016)  . Coronary artery disease   . GERD (gastroesophageal reflux disease)   . Heart murmur   . High cholesterol   . Hypertension   . Multiple myeloma ([place])   . Sleep apnea    "probably; having test in November" (07/31/2016)  . Type II diabetes mellitus ([place])     Past Surgical History:  Procedure Laterality Date  . CARDIAC CATHETERIZATION N/A 07/31/2016   Procedure: Left Heart Cath and Coronary Angiography;  Surgeon: Lorretta Harp, MD;  Location: [place] [facility];  Service: Cardiovascular;  Laterality: N/A;  . CARDIAC CATHETERIZATION N/A 07/31/2016   Procedure: Coronary Stent Intervention;  Surgeon: Lorretta Harp, MD;  Location: [place] [facility];  Service: Cardiovascular;  Laterality: N/A;  . CORONARY ANGIOPLASTY    . SHOULDER SURGERY Left 1973   "put pin in it where it had separated"   . TONSILLECTOMY  ~ 1956    There were no vitals filed for this visit.      Subjective Assessment - 05/20/17 1524    Subjective I'm not noticing the increased SOB when I'm at home but I'm also not doing as much as when I'm here. I see my cardiologist tomorrow. Back is still hurting. Started back with chemo treatments last week and that'll be every other Friday until  October.    Pertinent History Light chain Multiple myeloma with Lytic lesion with aggressive features in the right iliac bone and possibly other lytic lesions in the L spine , anemia, hypercalcemia and renal insuff (diagnosed in 09/2016)  Not on active treatment now, but trying a drug approved for maintenance.  hypertension, diabetes, dyslipidemia, coronary artery disease.    Patient Stated Goals to walk without falling, go up and down the steps to walk in the yard; get my back to quit hurting 04/23/2017  He states he is not falling anymore and is walking in his yard    Currently in Pain? Yes   Pain Score 7    Pain Location Back   Pain Orientation Lower   Pain Descriptors / Indicators Aching   Pain Type Chronic pain   Pain Onset More than a month ago   Pain Frequency Constant   Aggravating Factors  just always hurts   Pain Relieving Factors pain pills maybe                         [facility] Adult PT Treatment/Exercise - 05/20/17 0001      Ambulation/Gait   Gait Comments walked 170 feet with walker to stairs, then patient went up/down 4 steps with railing step-through going up and step-to going down (railing on left) x 2, then 120 feet back  to starting point     Neuro Re-ed    Neuro Re-ed Details  Standing on Airex for all next exercises: using walker for support for slow, controlled alternating march 10 times each, standing with eyese closed and narrow BOS and CGA for 30 seconds; bil tandem stance for 30 seconds each leg; 3 way LE raises at back of bike with +2 HHA ( VC throughout for erect posture with raises); bil SLS practice 2 sets of 30 seconds each     Elbow Exercises   Elbow Flexion Strengthening;Both;Seated;20 reps  2 sets of 10 times   Bar Weights/Barbell (Elbow Flexion) 3 lbs   Other elbow exercises wall pushups x 10 x 2     Lumbar Exercises: Aerobic   Stationary Bike Level 1 x 3 minutes, no increased SOB after     Knee/Hip Exercises: Standing   Wall Squat 10  reps;2 sets   Other Standing Knee Exercises standing with back in corner, touch foot up on 6 inch step, alternating x 10 each  Pt became SOB with this     Shoulder Exercises: Seated   Abduction Strengthening;Both;10 reps;Weights   ABduction Weight (lbs) 1  through partial ROM only   Other Seated Exercises scaption, bilat. 1 lb. each hand x 10  partial ROM only     Shoulder Exercises: Therapy Ball   Flexion 10 reps  2 sets of 5 with 2 lbs on each wrist   Flexion Limitations This made pt SOB today but SpO2 99%     Ankle Exercises: Standing   Heel Raises Other (comment)  2 sets of 20 at back of bike for +2 HHA                   Short Term Clinic Goals - 04/27/17 1529      CC Short Term Goal  #1   Title Patient will be independent with initial home exercise program   Status Achieved     CC Short Term Goal  #2   Title Pt. will demonstrate erect posture when walking in his walker for at least 50 feet.   Status Achieved             Long Term Clinic Goals - 05/11/17 1714      CC Long Term Goal  #2   Title Pt. will be able to negotiate three steps independently, safely, so that he can get in and out of his house on his own.   Status Achieved            Plan - 05/20/17 1612    Clinical Impression Statement Pts SOB was much improved this session. He had 3 episodes of SOB which was with rolling ball up wall, wall squats, and alternating toe taps on 6" step. SpO2 was at 99% when checked and pt reported feeling fine/no lightheadedness. Pt has appointment with cardiologist tomorrow.    Rehab Potential Good   Clinical Impairments Affecting Rehab Potential no weighted or resisted exercises due to nature of disease   PT Frequency 2x / week   PT Duration 4 weeks   PT Treatment/Interventions ADLs/Self Care Home Management;Electrical Stimulation;Moist Heat;Gait training;DME Instruction;Therapeutic exercise;Balance training;Patient/family education;Manual techniques    PT Next Visit Plan Continue functional strengthening, balance, and endurance.  See what his cardiologist said about his shortness of breath.   Consulted and Agree with Plan of Care Patient      Patient will benefit from skilled therapeutic intervention in order to improve the  following deficits and impairments:  Abnormal gait, Decreased activity tolerance, Decreased balance, Decreased strength, Pain  Visit Diagnosis: Difficulty in walking, not elsewhere classified  Chronic bilateral low back pain without sciatica  Abnormal posture  Muscle weakness (generalized)     Problem List Patient Active Problem List   Diagnosis Date Noted  . Multiple myeloma without remission ([place])   . B12 deficiency   . Lytic bone lesions on xray   . Anemia   . Hypercalcemia 10/02/2016  . Thrombocytopenia ([place]) 10/02/2016  . Low back pain 10/02/2016  . AKI (acute kidney injury) ([place])   . Acute congestive heart failure ([place])   . Bone mass   . CAD S/P percutaneous coronary angioplasty 07/31/2016  . DOE (dyspnea on exertion)   . Essential hypertension 07/18/2016  . Hyperlipidemia 07/18/2016  . Diabetes ([place]) 07/18/2016  . Chronic atrial fibrillation ([place]) 07/18/2016  . Current use of long term anticoagulation 07/18/2016  . Dyspnea on exertion 07/18/2016  . Cardiomyopathy, ischemic: EF ~25 % by LV Gram 07/18/2016  . Abnormal nuclear stress test 07/18/2016    Otelia Limes, PTA 05/20/2017, 4:20 PM  [place], [place], [address] Phone: [phone]   Fax:  [phone]  Name: Richard Vang MRN: [id number] Date of Birth: 1946/05/24

## 2017-05-27 ENCOUNTER — Ambulatory Visit: Payer: Medicare HMO | Admitting: Physical Therapy

## 2017-05-27 DIAGNOSIS — R262 Difficulty in walking, not elsewhere classified: Secondary | ICD-10-CM | POA: Diagnosis not present

## 2017-05-27 DIAGNOSIS — R293 Abnormal posture: Secondary | ICD-10-CM

## 2017-05-27 DIAGNOSIS — M6281 Muscle weakness (generalized): Secondary | ICD-10-CM

## 2017-05-27 DIAGNOSIS — M545 Low back pain: Secondary | ICD-10-CM

## 2017-05-27 DIAGNOSIS — G8929 Other chronic pain: Secondary | ICD-10-CM

## 2017-05-28 ENCOUNTER — Ambulatory Visit (HOSPITAL_BASED_OUTPATIENT_CLINIC_OR_DEPARTMENT_OTHER): Payer: Medicare HMO

## 2017-05-28 ENCOUNTER — Other Ambulatory Visit (HOSPITAL_BASED_OUTPATIENT_CLINIC_OR_DEPARTMENT_OTHER): Payer: Medicare HMO

## 2017-05-28 VITALS — BP 147/80 | HR 66 | Temp 97.9°F | Resp 16

## 2017-05-28 DIAGNOSIS — Z5112 Encounter for antineoplastic immunotherapy: Secondary | ICD-10-CM

## 2017-05-28 DIAGNOSIS — C9000 Multiple myeloma not having achieved remission: Secondary | ICD-10-CM

## 2017-05-28 LAB — CBC & DIFF AND RETIC
BASO%: 0.8 % (ref 0.0–2.0)
Basophils Absolute: 0 10*3/uL (ref 0.0–0.1)
EOS%: 2.3 % (ref 0.0–7.0)
Eosinophils Absolute: 0.1 10*3/uL (ref 0.0–0.5)
HEMATOCRIT: 36 % — AB (ref 38.4–49.9)
HGB: 11.7 g/dL — ABNORMAL LOW (ref 13.0–17.1)
Immature Retic Fract: 6.2 % (ref 3.00–10.60)
LYMPH%: 11.8 % — AB (ref 14.0–49.0)
MCH: 31.1 pg (ref 27.2–33.4)
MCHC: 32.5 g/dL (ref 32.0–36.0)
MCV: 95.7 fL (ref 79.3–98.0)
MONO#: 0.5 10*3/uL (ref 0.1–0.9)
MONO%: 10.1 % (ref 0.0–14.0)
NEUT#: 3.6 10*3/uL (ref 1.5–6.5)
NEUT%: 75 % (ref 39.0–75.0)
PLATELETS: 113 10*3/uL — AB (ref 140–400)
RBC: 3.76 10*6/uL — AB (ref 4.20–5.82)
RDW: 15.5 % — ABNORMAL HIGH (ref 11.0–14.6)
Retic %: 1.18 % (ref 0.80–1.80)
Retic Ct Abs: 44.37 10*3/uL (ref 34.80–93.90)
WBC: 4.7 10*3/uL (ref 4.0–10.3)
lymph#: 0.6 10*3/uL — ABNORMAL LOW (ref 0.9–3.3)

## 2017-05-28 LAB — COMPREHENSIVE METABOLIC PANEL
ALK PHOS: 104 U/L (ref 40–150)
ALT: 37 U/L (ref 0–55)
AST: 41 U/L — AB (ref 5–34)
Albumin: 3.3 g/dL — ABNORMAL LOW (ref 3.5–5.0)
Anion Gap: 6 mEq/L (ref 3–11)
BUN: 19.8 mg/dL (ref 7.0–26.0)
CHLORIDE: 108 meq/L (ref 98–109)
CO2: 29 meq/L (ref 22–29)
Calcium: 9.4 mg/dL (ref 8.4–10.4)
Creatinine: 1.1 mg/dL (ref 0.7–1.3)
EGFR: 70 mL/min/{1.73_m2} — AB (ref >90)
GLUCOSE: 101 mg/dL (ref 70–140)
Potassium: 4.3 mEq/L (ref 3.5–5.1)
Sodium: 143 mEq/L (ref 136–145)
Total Bilirubin: 0.99 mg/dL (ref 0.20–1.20)
Total Protein: 6 g/dL — ABNORMAL LOW (ref 6.4–8.3)

## 2017-05-28 MED ORDER — PROCHLORPERAZINE MALEATE 10 MG PO TABS
ORAL_TABLET | ORAL | Status: AC
  Filled 2017-05-28: qty 1

## 2017-05-28 MED ORDER — BORTEZOMIB CHEMO SQ INJECTION 3.5 MG (2.5MG/ML)
1.3000 mg/m2 | Freq: Once | INTRAMUSCULAR | Status: AC
  Administered 2017-05-28: 2.5 mg via SUBCUTANEOUS
  Filled 2017-05-28: qty 2.5

## 2017-05-28 MED ORDER — PROCHLORPERAZINE MALEATE 10 MG PO TABS
10.0000 mg | ORAL_TABLET | Freq: Once | ORAL | Status: AC
  Administered 2017-05-28: 10 mg via ORAL

## 2017-05-28 NOTE — Patient Instructions (Signed)
[facility] Discharge Instructions for Patients Receiving Chemotherapy  Today you received the following chemotherapy agents Velcade. To help prevent nausea and vomiting after your treatment, we encourage you to take your nausea medication as directed.  If you develop nausea and vomiting that is not controlled by your nausea medication, call the clinic.   BELOW ARE SYMPTOMS THAT SHOULD BE REPORTED IMMEDIATELY:  *FEVER GREATER THAN 100.5 F  *CHILLS WITH OR WITHOUT FEVER  NAUSEA AND VOMITING THAT IS NOT CONTROLLED WITH YOUR NAUSEA MEDICATION  *UNUSUAL SHORTNESS OF BREATH  *UNUSUAL BRUISING OR BLEEDING  TENDERNESS IN MOUTH AND THROAT WITH OR WITHOUT PRESENCE OF ULCERS  *URINARY PROBLEMS  *BOWEL PROBLEMS  UNUSUAL RASH Items with * indicate a potential emergency and should be followed up as soon as possible.  Feel free to call the clinic you have any questions or concerns. The clinic phone number is [phone].  Please show the CHEMO ALERT CARD at check-in to the Emergency Department and triage nurse.    

## 2017-05-28 NOTE — Therapy (Addendum)
[place], [place], [address] Phone: [phone]   Fax:  [phone]  Physical Therapy Treatment  Patient Details  Name: Richard Vang MRN: [id number] Date of Birth: 11/14/1945 Referring Provider: Dr. Sullivan Lone  Encounter Date: 05/27/2017      PT End of Session - 05/27/17 1352    Visit Number 20   Number of Visits 30   Date for PT Re-Evaluation 06/26/17   PT Start Time 1347   PT Stop Time 1435   PT Time Calculation (min) 48 min   Activity Tolerance Patient tolerated treatment well   Behavior During Therapy [facility] for tasks assessed/performed      Past Medical History:  Diagnosis Date  . Arthritis    "left shoulder" (07/31/2016)  . Coronary artery disease   . GERD (gastroesophageal reflux disease)   . Heart murmur   . High cholesterol   . Hypertension   . Multiple myeloma ([place])   . Sleep apnea    "probably; having test in November" (07/31/2016)  . Type II diabetes mellitus ([place])     Past Surgical History:  Procedure Laterality Date  . CARDIAC CATHETERIZATION N/A 07/31/2016   Procedure: Left Heart Cath and Coronary Angiography;  Surgeon: Lorretta Harp, MD;  Location: [place] [facility];  Service: Cardiovascular;  Laterality: N/A;  . CARDIAC CATHETERIZATION N/A 07/31/2016   Procedure: Coronary Stent Intervention;  Surgeon: Lorretta Harp, MD;  Location: [place] [facility];  Service: Cardiovascular;  Laterality: N/A;  . CORONARY ANGIOPLASTY    . SHOULDER SURGERY Left 1973   "put pin in it where it had separated"   . TONSILLECTOMY  ~ 1956    There were no vitals filed for this visit.      Subjective Assessment - 05/27/17 1350    Subjective "It's just my back."  Pt says he has been having a streak where it hurts a lot.  He went to the doctor who said the shortness of breath comes because he breathes hard from the pain    Pertinent History Light chain Multiple myeloma with Lytic  lesion with aggressive features in the right iliac bone and possibly other lytic lesions in the L spine , anemia, hypercalcemia and renal insuff (diagnosed in 09/2016)  Not on active treatment now, but trying a drug approved for maintenance.  hypertension, diabetes, dyslipidemia, coronary artery disease.    Patient Stated Goals to walk without falling, go up and down the steps to walk in the yard; get my back to quit hurting 04/23/2017  He states he is not falling anymore and is walking in his yard    Currently in Pain? Yes   Pain Score 7    Pain Location Back   Pain Orientation Lower;Medial   Pain Type Chronic pain   Pain Radiating Towards no pain radiation today    Pain Onset More than a month ago                                    Short Term Clinic Goals - 04/27/17 1529      CC Short Term Goal  #1   Title Patient will be independent with initial home exercise program   Status Achieved     CC Short Term Goal  #2   Title Pt. will demonstrate erect posture when walking in his walker for at least 50 feet.  Status Achieved             Long Term Clinic Goals - 06-10-17 1353      CC Long Term Goal  #1   Title Pt. will be able to stand from sitting without having to push off with his arms, indicating improved LE strength and mobility.   Baseline 04/27/17--can stand with hands on thighs from standard height chair  2017/06/10 it depends on the pain   Status Achieved     CC Long Term Goal  #2   Title Pt. will be able to negotiate three steps independently, safely, so that he can get in and out of his house on his own.   Status Achieved     CC Long Term Goal  #3   Title Pt. will be able to walk 400 feet without needing to rest.   Time 8   Period Weeks   Status On-going     CC Long Term Goal  #4   Title If Berg balance score is below 46 initiallly, score will increase by at least 8 points, indicating improved safety with mobility.   Status On-going             Plan - 05/28/17 1006    Clinical Impression Statement Pt did well with exercise today.  He found the sit and fit to be beneficial and felt his back to be better while he execised when sitting on this. He has was able to do UE exercises relatively pain free as long as kept his arms below 90 degrees of elevation. His grip strength is fairly good and symmetrical. He wants to keep coming here to exercise until the end of August.    Rehab Potential Good   Clinical Impairments Affecting Rehab Potential no weighted or resisted exercises due to nature of disease   PT Next Visit Plan Continue functional strengthening, balance, and endurance being mindfull to keep arms below 90 degrees.    Consulted and Agree with Plan of Care Patient      Patient will benefit from skilled therapeutic intervention in order to improve the following deficits and impairments:  Abnormal gait, Decreased activity tolerance, Decreased balance, Decreased strength, Pain  Visit Diagnosis: Difficulty in walking, not elsewhere classified  Chronic bilateral low back pain without sciatica  Abnormal posture  Muscle weakness (generalized)       G-Codes - 2017-06-10 1431    Functional Assessment Tool Used (Outpatient Only) clinical judgement   Functional Limitation Mobility: Walking and moving around   Mobility: Walking and Moving Around Current Status ([id number]) At least 40 percent but less than 60 percent impaired, limited or restricted      Problem List Patient Active Problem List   Diagnosis Date Noted  . Multiple myeloma without remission ([place])   . B12 deficiency   . Lytic bone lesions on xray   . Anemia   . Hypercalcemia 10/02/2016  . Thrombocytopenia ([place]) 10/02/2016  . Low back pain 10/02/2016  . AKI (acute kidney injury) ([place])   . Acute congestive heart failure ([place])   . Bone mass   . CAD S/P percutaneous coronary angioplasty 07/31/2016  . DOE (dyspnea on exertion)   . Essential hypertension  07/18/2016  . Hyperlipidemia 07/18/2016  . Diabetes ([place]) 07/18/2016  . Chronic atrial fibrillation ([place]) 07/18/2016  . Current use of long term anticoagulation 07/18/2016  . Dyspnea on exertion 07/18/2016  . Cardiomyopathy, ischemic: EF ~25 % by LV Gram 07/18/2016  . Abnormal nuclear stress  test 07/18/2016   Donato Heinz. Owens Shark PT  Norwood Levo 05/28/2017, 10:11 AM  [place], [place], [address] Phone: [phone]   Fax:  [phone]  Name: ALEXANDER AUMENT MRN: [id number] Date of Birth: Mar 25, 1946  PHYSICAL THERAPY DISCHARGE SUMMARY  Visits from Start of Care: 20  Current functional level related to goals / functional outcomes: unknown   Remaining deficits: unknown   Education / Equipment: Home exercise  Plan: Patient agrees to discharge.  Patient goals were partially met. Patient is being discharged due to not returning since the last visit.  ?????    Maudry Diego, PT 05/27/18 2:18 PM

## 2017-06-02 ENCOUNTER — Encounter: Payer: Medicare HMO | Admitting: Physical Therapy

## 2017-06-04 ENCOUNTER — Ambulatory Visit (HOSPITAL_BASED_OUTPATIENT_CLINIC_OR_DEPARTMENT_OTHER): Payer: Medicare HMO | Admitting: Hematology

## 2017-06-04 ENCOUNTER — Encounter: Payer: Self-pay | Admitting: Hematology

## 2017-06-04 ENCOUNTER — Other Ambulatory Visit (HOSPITAL_BASED_OUTPATIENT_CLINIC_OR_DEPARTMENT_OTHER): Payer: Medicare HMO

## 2017-06-04 ENCOUNTER — Other Ambulatory Visit: Payer: Self-pay

## 2017-06-04 ENCOUNTER — Telehealth: Payer: Self-pay | Admitting: Hematology

## 2017-06-04 VITALS — BP 115/63 | HR 64 | Temp 98.5°F | Resp 19 | Ht 70.0 in | Wt 179.1 lb

## 2017-06-04 DIAGNOSIS — C9000 Multiple myeloma not having achieved remission: Secondary | ICD-10-CM | POA: Diagnosis not present

## 2017-06-04 DIAGNOSIS — E538 Deficiency of other specified B group vitamins: Secondary | ICD-10-CM | POA: Diagnosis not present

## 2017-06-04 DIAGNOSIS — I10 Essential (primary) hypertension: Secondary | ICD-10-CM | POA: Diagnosis not present

## 2017-06-04 DIAGNOSIS — E119 Type 2 diabetes mellitus without complications: Secondary | ICD-10-CM | POA: Diagnosis not present

## 2017-06-04 LAB — CBC WITH DIFFERENTIAL/PLATELET
BASO%: 0.4 % (ref 0.0–2.0)
Basophils Absolute: 0 10*3/uL (ref 0.0–0.1)
EOS%: 2 % (ref 0.0–7.0)
Eosinophils Absolute: 0.1 10*3/uL (ref 0.0–0.5)
HEMATOCRIT: 36.2 % — AB (ref 38.4–49.9)
HGB: 11.7 g/dL — ABNORMAL LOW (ref 13.0–17.1)
LYMPH#: 0.5 10*3/uL — AB (ref 0.9–3.3)
LYMPH%: 10.7 % — ABNORMAL LOW (ref 14.0–49.0)
MCH: 31.2 pg (ref 27.2–33.4)
MCHC: 32.3 g/dL (ref 32.0–36.0)
MCV: 96.5 fL (ref 79.3–98.0)
MONO#: 0.5 10*3/uL (ref 0.1–0.9)
MONO%: 8.9 % (ref 0.0–14.0)
NEUT#: 3.9 10*3/uL (ref 1.5–6.5)
NEUT%: 78 % — AB (ref 39.0–75.0)
Platelets: 122 10*3/uL — ABNORMAL LOW (ref 140–400)
RBC: 3.75 10*6/uL — AB (ref 4.20–5.82)
RDW: 15.2 % — ABNORMAL HIGH (ref 11.0–14.6)
WBC: 5.1 10*3/uL (ref 4.0–10.3)

## 2017-06-04 LAB — COMPREHENSIVE METABOLIC PANEL
ALT: 26 U/L (ref 0–55)
ANION GAP: 9 meq/L (ref 3–11)
AST: 31 U/L (ref 5–34)
Albumin: 3.3 g/dL — ABNORMAL LOW (ref 3.5–5.0)
Alkaline Phosphatase: 106 U/L (ref 40–150)
BUN: 24.5 mg/dL (ref 7.0–26.0)
CHLORIDE: 111 meq/L — AB (ref 98–109)
CO2: 24 meq/L (ref 22–29)
CREATININE: 1.3 mg/dL (ref 0.7–1.3)
Calcium: 9.2 mg/dL (ref 8.4–10.4)
EGFR: 56 mL/min/{1.73_m2} — ABNORMAL LOW (ref >90)
Glucose: 109 mg/dl (ref 70–140)
Potassium: 4 mEq/L (ref 3.5–5.1)
SODIUM: 144 meq/L (ref 136–145)
Total Bilirubin: 1.08 mg/dL (ref 0.20–1.20)
Total Protein: 6 g/dL — ABNORMAL LOW (ref 6.4–8.3)

## 2017-06-04 NOTE — Telephone Encounter (Signed)
Gave avs and calendar to the patient for appts in Aug, Sept and Oct.

## 2017-06-04 NOTE — Patient Instructions (Signed)
Thank you for choosing [facility] to provide your oncology and hematology care.  To afford each patient quality time with our providers, please arrive 30 minutes before your scheduled appointment time.  If you arrive late for your appointment, you may be asked to reschedule.  We strive to give you quality time with our providers, and arriving late affects you and other patients whose appointments are after yours.  If you are a no show for multiple scheduled visits, you may be dismissed from the clinic at the providers discretion.   Again, thank you for choosing [facility], our hope is that these requests will decrease the amount of time that you wait before being seen by our physicians.  ______________________________________________________________________ Should you have questions after your visit to the [facility], please contact our office at [phone] between the hours of 8:30 and 4:30 p.m.    Voicemails left after 4:30p.m will not be returned until the following business day.   For prescription refill requests, please have your pharmacy contact us directly.  Please also try to allow 48 hours for prescription requests.   Please contact the scheduling department for questions regarding scheduling.  For scheduling of procedures such as PET scans, CT scans, MRI, Ultrasound, etc please contact central scheduling at [phone].   Resources For Cancer Patients and Caregivers:  American Cancer Society:  800-227-2345  Can help patients locate various types of support and financial assistance Cancer Care: 1-800-813-HOPE (4673) Provides financial assistance, online support groups, medication/co-pay assistance.   [place] DSS:  336-641-3447 Where to apply for food stamps, Medicaid, and utility assistance Medicare Rights Center: 800-333-4114 Helps people with Medicare understand their rights and benefits, navigate the Medicare system, and secure the  quality healthcare they deserve SCAT: 336-333-6589 [place] [employer]'s shared-ride transportation service for eligible riders who have a disability that prevents them from riding the fixed route bus.   For additional information on assistance programs please contact our social worker:   Grier Hock/Abigail Elmore:  [phone] 

## 2017-06-05 LAB — VITAMIN B12: VITAMIN B 12: 835 pg/mL (ref 232–1245)

## 2017-06-05 LAB — KAPPA/LAMBDA LIGHT CHAINS
IG KAPPA FREE LIGHT CHAIN: 42.2 mg/L — AB (ref 3.3–19.4)
Ig Lambda Free Light Chain: 13.2 mg/L (ref 5.7–26.3)
Kappa/Lambda FluidC Ratio: 3.2 — ABNORMAL HIGH (ref 0.26–1.65)

## 2017-06-07 LAB — MULTIPLE MYELOMA PANEL, SERUM
Albumin SerPl Elph-Mcnc: 3.5 g/dL (ref 2.9–4.4)
Albumin/Glob SerPl: 1.6 (ref 0.7–1.7)
Alpha 1: 0.2 g/dL (ref 0.0–0.4)
Alpha2 Glob SerPl Elph-Mcnc: 0.5 g/dL (ref 0.4–1.0)
B-Globulin SerPl Elph-Mcnc: 0.7 g/dL (ref 0.7–1.3)
Gamma Glob SerPl Elph-Mcnc: 0.8 g/dL (ref 0.4–1.8)
Globulin, Total: 2.3 g/dL (ref 2.2–3.9)
IgA, Qn, Serum: 57 mg/dL — ABNORMAL LOW (ref 61–437)
IgG, Qn, Serum: 896 mg/dL (ref 700–1600)
IgM, Qn, Serum: 56 mg/dL (ref 20–172)
Total Protein: 5.8 g/dL — ABNORMAL LOW (ref 6.0–8.5)

## 2017-06-07 NOTE — Progress Notes (Signed)
[name]    HEMATOLOGY/ONCOLOGY CLINIC NOTE  Date of Service: .06/04/2017  Patient Care Team: Sandi Mariscal, MD as PCP - General (Internal Medicine)  CHIEF COMPLAINTS/PURPOSE OF CONSULTATION:  f/u for Multiple Myeloma  HISTORY OF PRESENTING ILLNESS:  plz see previous note for details on HPI  INTERVAL HISTORY  Richard Vang is here for his scheduled followup for myeloma along with his wife for reevaluation. He is tolerating maintenance treatment with Velcade well. Notes feet neuropathy stable/improved. Notes no diarrhea and energy levels much improved. No fevers/chills/night sweats/new bone pains. No other acute new symptoms.  Accompanied by his wife and grand daughter.  MEDICAL HISTORY:  Past Medical History:  Diagnosis Date  . Arthritis    "left shoulder" (07/31/2016)  . Coronary artery disease   . GERD (gastroesophageal reflux disease)   . Heart murmur   . High cholesterol   . Hypertension   . Multiple myeloma ([place])   . Sleep apnea    "probably; having test in November" (07/31/2016)  . Type II diabetes mellitus ([place])     SURGICAL HISTORY: Past Surgical History:  Procedure Laterality Date  . CARDIAC CATHETERIZATION N/A 07/31/2016   Procedure: Left Heart Cath and Coronary Angiography;  Surgeon: Lorretta Harp, MD;  Location: [facility];  Service: Cardiovascular;  Laterality: N/A;  . CARDIAC CATHETERIZATION N/A 07/31/2016   Procedure: Coronary Stent Intervention;  Surgeon: Lorretta Harp, MD;  Location: [facility];  Service: Cardiovascular;  Laterality: N/A;  . CORONARY ANGIOPLASTY    . SHOULDER SURGERY Left 1973   "put pin in it where it had separated"   . TONSILLECTOMY  ~ 1956    SOCIAL HISTORY: Social History   Social History  . Marital status: Divorced    Spouse name: N/A  . Number of children: N/A  . Years of education: N/A   Occupational History  . Not on file.   Social History Main Topics  . Smoking status: Never Smoker  . Smokeless tobacco:  Never Used  . Alcohol use Yes     Comment: 07/31/2016 "nothing since 2002"  . Drug use: No  . Sexual activity: Not Currently   Other Topics Concern  . Not on file   Social History Narrative  . No narrative on file    FAMILY HISTORY: Family History  Problem Relation Age of Onset  . Hypertension Other     ALLERGIES:  has No Known Allergies.  MEDICATIONS:  Current Outpatient Prescriptions  Medication Sig Dispense Refill  . acyclovir (ZOVIRAX) 400 MG tablet TAKE ONE TABLET BY MOUTH TWICE DAILY 60 tablet 3  . amLODipine (NORVASC) 5 MG tablet Take 5 mg by mouth daily.    [name] atorvastatin (LIPITOR) 40 MG tablet Take 1 tablet (40 mg total) by mouth at bedtime. 30 tablet 12  . carvedilol (COREG) 12.5 MG tablet Take 12.5 mg by mouth 2 (two) times daily with a meal.    . Cholecalciferol (VITAMIN D3) 5000 units CAPS Take 1 capsule by mouth daily.     . clopidogrel (PLAVIX) 75 MG tablet Take 1 tablet (75 mg total) by mouth daily with breakfast. 30 tablet 12  . Cyanocobalamin (B-12) 1000 MCG SUBL Place 1,000 mcg under the tongue daily. 30 each 3  . escitalopram (LEXAPRO) 5 MG tablet Take 5 mg by mouth daily.    . feeding supplement, ENSURE ENLIVE, (ENSURE ENLIVE) LIQD Take 237 mLs by mouth 2 (two) times daily between meals. 237 mL 12  . furosemide (LASIX)  40 MG tablet     . insulin aspart (NOVOLOG) 100 UNIT/ML injection Correction coverage: Sensitive (thin, NPO, renal)  CBG < 70: implement hypoglycemia protocol  CBG 70 - 120: 0 units  CBG 121 - 150: 1 unit  CBG 151 - 200: 2 units  CBG 201 - 250: 3 units  CBG 251 - 300: 5 units  CBG 301 - 350: 7 units  CBG 351 - 400 9 units  CBG > 400 call MD and obtain STAT lab verification 10 mL 11  . lactobacillus acidophilus & bulgar (LACTINEX) chewable tablet Chew 1 tablet by mouth 3 (three) times daily with meals. 30 tablet 0  . montelukast (SINGULAIR) 10 MG tablet Take 10 mg by mouth at bedtime.    . nitroGLYCERIN (NITROSTAT) 0.3 MG SL tablet  Place 0.3 mg under the tongue as needed for chest pain.    [name] omeprazole (PRILOSEC) 40 MG capsule Take 40 mg by mouth daily.    . ondansetron (ZOFRAN) 8 MG tablet Take 1 tablet (8 mg total) by mouth 2 (two) times daily as needed for refractory nausea / vomiting. Starting on days 4 and 11 not on day 8. 30 tablet 1  . oxyCODONE-acetaminophen (PERCOCET) 10-325 MG tablet Take 1 tablet by mouth 2 (two) times daily.    . polyethylene glycol (MIRALAX / GLYCOLAX) packet Take 17 g by mouth daily.    . potassium chloride SA (K-DUR,KLOR-CON) 20 MEQ tablet Take 1 tablet (20 mEq total) by mouth daily.    . prochlorperazine (COMPAZINE) 10 MG tablet Take 1 tablet (10 mg total) by mouth every 6 (six) hours as needed (Nausea or vomiting). (Patient not taking: Reported on 05/07/2017) 30 tablet 1  . warfarin (COUMADIN) 6 MG tablet Take 4 mg by mouth every morning.      No current facility-administered medications for this visit.     REVIEW OF SYSTEMS:    10 Point review of Systems was done is negative except as noted above.  PHYSICAL EXAMINATION: ECOG PERFORMANCE STATUS: 2  . Vitals:   06/04/17 1417  BP: 115/63  Pulse: 64  Resp: 19  Temp: 98.5 F (36.9 C)  SpO2: 100%   Filed Weights   06/04/17 1417  Weight: 179 lb 1.6 oz (81.2 kg)   .Body mass index is 25.7 kg/m.  GENERAL: NAD SKIN: no acute rsahes OROPHARYNX: MMM  NECK: supple,+ JVD, LYMPH: no palpable lymphadenopathy in the cervical, axillary or inguinal LUNGS: CTA b/l HEART: S1-S2 irreg ABDOMEN: abdomen soft, non-tender, normoactive bowel sounds, no hepatosplenomegaly palpable PSYCH: Alert and oriented 3  NEURO: non focal. LE : b/l trace pedal edema   LABORATORY DATA:  I have reviewed the data as listed . CBC Latest Ref Rng & Units 06/04/2017 05/28/2017 05/07/2017  WBC 4.0 - 10.3 10e3/uL 5.1 4.7 5.3  Hemoglobin 13.0 - 17.1 g/dL 11.7(L) 11.7(L) 11.9(L)  Hematocrit 38.4 - 49.9 % 36.2(L) 36.0(L) 36.4(L)  Platelets 140 - 400 10e3/uL  122(L) 113(L) 166   . CBC    Component Value Date/Time   WBC 5.1 06/04/2017 1339   WBC 3.4 (L) 10/08/2016 0234   RBC 3.75 (L) 06/04/2017 1339   RBC 3.05 (L) 10/08/2016 0234   HGB 11.7 (L) 06/04/2017 1339   HCT 36.2 (L) 06/04/2017 1339   PLT 122 (L) 06/04/2017 1339   MCV 96.5 06/04/2017 1339   MCH 31.2 06/04/2017 1339   MCH 30.2 10/08/2016 0234   MCHC 32.3 06/04/2017 1339   MCHC 30.4 10/08/2016 0234  RDW 15.2 (H) 06/04/2017 1339   LYMPHSABS 0.5 (L) 06/04/2017 1339   MONOABS 0.5 06/04/2017 1339   EOSABS 0.1 06/04/2017 1339   BASOSABS 0.0 06/04/2017 1339    . CMP Latest Ref Rng & Units 06/04/2017 05/28/2017 05/07/2017  Glucose 70 - 140 mg/dl 109 101 95  BUN 7.0 - 26.0 mg/dL 24.5 19.8 26.4(H)  Creatinine 0.7 - 1.3 mg/dL 1.3 1.1 1.3  Sodium 136 - 145 mEq/L 144 143 143  Potassium 3.5 - 5.1 mEq/L 4.0 4.3 4.2  Chloride 101 - 111 mmol/L - - -  CO2 22 - 29 mEq/L _0 Calcium 8.4 - 10.4 mg/dL 9.2 9.4 9.1  Total Protein 6.4 - 8.3 g/dL 6.0(L) 6.0(L) 6.2(L)  Total Bilirubin 0.20 - 1.20 mg/dL 1.08 0.99 0.92  Alkaline Phos 40 - 150 U/L 106 104 113  AST 5 - 34 U/L 31 41(H) 43(H)  ALT 0 - 55 U/L 26 37 44      RADIOGRAPHIC STUDIES: I have personally reviewed the radiological images as listed and agreed with the findings in the report. No results found.  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  K/L 95, No M spike on SPEP -- suggests  light chain MM Cytogenetics - normal male chromosomes FISH- +11 and 13q-/-13  Patient is s/p 1 cycle of Vd and Serum free kappa LC has decreased from 700 to 203.6 with improvement in his K/L ratio from 94.59 to 29. Completed 2nd cycle of treatment with Vd + Cytoxan (276m/m2) Completed 3rd cycle of treatment with Vd + Cytoxan (2035mm2) - cytoxan held D15 due to thrombocytopenia Completed 4th of VCd Discontinued VCd after C5D8 due to intolerance with diarrhea and increasing neuropathy and fatigue with borderline functional status. -Patient did not tolerate maintenance Revlimid 10 mg by mouth daily due to grade 2-3 diarrhea. This was discontinued and his diarrhea has resolved. Plan -tolerating maintenance Velcade at this time q2weeks. -kappa SLFC gradually trending up. Will need to be monitored. If worsening will need to consider other treatment options based on patients preference and functional status. -Continue B12 replacement -Continue acyclovir for shingles prophylaxis. -Continue aspirin -continue Pamidronate q4weeks -graduated from outpatient cancer rehabilitation  2) CAD s/p prox LAD DES PCI on 07/31/2016 with ischemic cardiomyopathy ejection fraction of 25-35%. Rpt ECHO on 10/03/2016 shows improvement in EF to 55-60% with no RWMABN.  3) Macrocytic Anemia with moderate thrombocytopenia - due to MM and B12 def hgb stable around 12  5) Thrombocytopenia - due to MM and treatment --mild. --will monitor   6) B12 deficiency - Received [place] B12 daily while in the hospital. B12 levels today 529 Plan -cont SL B12 100013mpo daily  6) b/l Lower extremity swelling likely due to CHF an CKD. Much improved. Plan -continued adjustment and monitoring of diuretic therapy as per PCP and cardiology  .7) CAD s/p recent DES 8) h/o Afib with RVR on coumadin -continue mx per PCP and cardiology  9) Decubitus Ulcers on buttocks -healed   10) diarrhea - resolved. Stool  studies for GI  pathogen panel and C. Difficile neg S/p empiric treatment for c diff with po vancomycin Plan Resolved off Revlimid.  11) Elevated Alkaline phosphatase, AST, ALT. No abdominal pain no fevers. Previous CT abd in 09/2016 has shown signs of liver cirrhosis, choledocholithiasis.  ?hepatic congestion from CHF Plan -resolved on rpt labs today -monitor  Continue Maintainnce Velcade q2weeks setup next 2 months of treatment Continue Aredia q4weeks setup next 2 months of treatment Labs q2weeks RTC with Dr Irene Limbo in 8 weeks with labs   All of the patients questions were answered with apparent satisfaction. The patient knows to call the clinic with any problems, questions or concerns.  I spent 20 minutes counseling the patient face to face. The total time spent in the appointment was 25 minutes and more than 50% was on counseling and direct patient cares.    Sullivan Lone MD [place] AAHIVMS [facility] Hematology/Oncology Physician [facility]  (Office):       [phone] (Work cell):  [phone] (Fax):           [phone]

## 2017-06-08 ENCOUNTER — Encounter: Payer: Medicare HMO | Admitting: Physical Therapy

## 2017-06-10 ENCOUNTER — Encounter: Payer: Medicare HMO | Admitting: Physical Therapy

## 2017-06-11 ENCOUNTER — Ambulatory Visit (HOSPITAL_BASED_OUTPATIENT_CLINIC_OR_DEPARTMENT_OTHER): Payer: Medicare HMO

## 2017-06-11 VITALS — BP 132/84 | HR 76 | Temp 98.1°F | Resp 19 | Ht 70.0 in

## 2017-06-11 DIAGNOSIS — C9000 Multiple myeloma not having achieved remission: Secondary | ICD-10-CM

## 2017-06-11 DIAGNOSIS — Z5112 Encounter for antineoplastic immunotherapy: Secondary | ICD-10-CM | POA: Diagnosis not present

## 2017-06-11 MED ORDER — SODIUM CHLORIDE 0.9 % IV SOLN
60.0000 mg | Freq: Once | INTRAVENOUS | Status: AC
  Administered 2017-06-11: 60 mg via INTRAVENOUS
  Filled 2017-06-11: qty 10
  Filled 2017-06-11: qty 20

## 2017-06-11 MED ORDER — PROCHLORPERAZINE MALEATE 10 MG PO TABS
10.0000 mg | ORAL_TABLET | Freq: Once | ORAL | Status: AC
  Administered 2017-06-11: 10 mg via ORAL

## 2017-06-11 MED ORDER — BORTEZOMIB CHEMO SQ INJECTION 3.5 MG (2.5MG/ML)
1.3000 mg/m2 | Freq: Once | INTRAMUSCULAR | Status: AC
  Administered 2017-06-11: 2.5 mg via SUBCUTANEOUS
  Filled 2017-06-11: qty 2.5

## 2017-06-11 MED ORDER — SODIUM CHLORIDE 0.9 % IV SOLN
INTRAVENOUS | Status: DC
  Administered 2017-06-11: 11:00:00 via INTRAVENOUS

## 2017-06-11 MED ORDER — PROCHLORPERAZINE MALEATE 10 MG PO TABS
ORAL_TABLET | ORAL | Status: AC
  Filled 2017-06-11: qty 1

## 2017-06-11 NOTE — Patient Instructions (Addendum)
[facility] Discharge Instructions for Patients Receiving Chemotherapy  Today you received the following chemotherapy agents: Velcade  To help prevent nausea and vomiting after your treatment, we encourage you to take your nausea medication as directed.    If you develop nausea and vomiting that is not controlled by your nausea medication, call the clinic.   BELOW ARE SYMPTOMS THAT SHOULD BE REPORTED IMMEDIATELY:  *FEVER GREATER THAN 100.5 F  *CHILLS WITH OR WITHOUT FEVER  NAUSEA AND VOMITING THAT IS NOT CONTROLLED WITH YOUR NAUSEA MEDICATION  *UNUSUAL SHORTNESS OF BREATH  *UNUSUAL BRUISING OR BLEEDING  TENDERNESS IN MOUTH AND THROAT WITH OR WITHOUT PRESENCE OF ULCERS  *URINARY PROBLEMS  *BOWEL PROBLEMS  UNUSUAL RASH Items with * indicate a potential emergency and should be followed up as soon as possible.  Feel free to call the clinic you have any questions or concerns. The clinic phone number is [phone].  Please show the [place] at check-in to the Emergency Department and triage nurse.   Pamidronate injection What is this medicine? PAMIDRONATE (pa mi DROE nate) slows calcium loss from bones. It is used to treat high calcium blood levels from cancer or Paget's disease. It is also used to treat bone pain and prevent fractures from certain cancers that have spread to the bone. This medicine may be used for other purposes; ask your health care provider or pharmacist if you have questions. COMMON BRAND NAME(S): Aredia What should I tell my health care provider before I take this medicine? They need to know if you have any of these conditions: -aspirin-sensitive asthma -dental disease -kidney disease -an unusual or allergic reaction to pamidronate, other medicines, foods, dyes, or preservatives -pregnant or trying to get pregnant -breast-feeding How should I use this medicine? This medicine is for infusion into a vein. It is given by a health  care professional in a hospital or clinic setting. Talk to your pediatrician regarding the use of this medicine in children. This medicine is not approved for use in children. Overdosage: If you think you have taken too much of this medicine contact a poison control center or emergency room at once. NOTE: This medicine is only for you. Do not share this medicine with others. What if I miss a dose? This does not apply. What may interact with this medicine? -certain antibiotics given by injection -medicines for inflammation or pain like ibuprofen, naproxen -some diuretics like bumetanide, furosemide -cyclosporine -parathyroid hormone -tacrolimus -teriparatide -thalidomide This list may not describe all possible interactions. Give your health care provider a list of all the medicines, herbs, non-prescription drugs, or dietary supplements you use. Also tell them if you smoke, drink alcohol, or use illegal drugs. Some items may interact with your medicine. What should I watch for while using this medicine? Visit your doctor or health care professional for regular checkups. It may be some time before you see the benefit from this medicine. Do not stop taking your medicine unless your doctor tells you to. Your doctor may order blood tests or other tests to see how you are doing. Women should inform their doctor if they wish to become pregnant or think they might be pregnant. There is a potential for serious side effects to an unborn child. Talk to your health care professional or pharmacist for more information. You should make sure that you get enough calcium and vitamin D while you are taking this medicine. Discuss the foods you eat and the vitamins you take  with your health care professional. Some people who take this medicine have severe bone, joint, and/or muscle pain. This medicine may also increase your risk for a broken thigh bone. Tell your doctor right away if you have pain in your upper leg  or groin. Tell your doctor if you have any pain that does not go away or that gets worse. What side effects may I notice from receiving this medicine? Side effects that you should report to your doctor or health care professional as soon as possible: -allergic reactions like skin rash, itching or hives, swelling of the face, lips, or tongue -black or tarry stools -changes in vision -eye inflammation, pain -high blood pressure -jaw pain, especially burning or cramping -muscle weakness -numb, tingling pain -swelling of feet or hands -trouble passing urine or change in the amount of urine -unable to move easily Side effects that usually do not require medical attention (report to your doctor or health care professional if they continue or are bothersome): -bone, joint, or muscle pain -constipation -dizzy, drowsy -fever -headache -loss of appetite -nausea, vomiting -pain at site where injected This list may not describe all possible side effects. Call your doctor for medical advice about side effects. You may report side effects to FDA at 1-800-FDA-1088. Where should I keep my medicine? This drug is given in a hospital or clinic and will not be stored at home. NOTE: This sheet is a summary. It may not cover all possible information. If you have questions about this medicine, talk to your doctor, pharmacist, or health care provider.  2018 Elsevier/Gold Standard (2011-04-04 08:49:49)

## 2017-06-15 ENCOUNTER — Encounter: Payer: Medicare HMO | Admitting: Physical Therapy

## 2017-06-17 ENCOUNTER — Encounter: Payer: Medicare HMO | Admitting: Physical Therapy

## 2017-06-22 ENCOUNTER — Other Ambulatory Visit: Payer: Self-pay | Admitting: Hematology

## 2017-06-22 DIAGNOSIS — C9000 Multiple myeloma not having achieved remission: Secondary | ICD-10-CM

## 2017-06-25 ENCOUNTER — Ambulatory Visit (HOSPITAL_BASED_OUTPATIENT_CLINIC_OR_DEPARTMENT_OTHER): Payer: Medicare HMO

## 2017-06-25 ENCOUNTER — Other Ambulatory Visit (HOSPITAL_BASED_OUTPATIENT_CLINIC_OR_DEPARTMENT_OTHER): Payer: Medicare HMO

## 2017-06-25 VITALS — BP 133/78 | HR 75 | Temp 98.1°F | Resp 20

## 2017-06-25 DIAGNOSIS — C9000 Multiple myeloma not having achieved remission: Secondary | ICD-10-CM

## 2017-06-25 DIAGNOSIS — Z5112 Encounter for antineoplastic immunotherapy: Secondary | ICD-10-CM

## 2017-06-25 LAB — COMPREHENSIVE METABOLIC PANEL
ALBUMIN: 3.4 g/dL — AB (ref 3.5–5.0)
ALK PHOS: 92 U/L (ref 40–150)
ALT: 21 U/L (ref 0–55)
ANION GAP: 7 meq/L (ref 3–11)
AST: 26 U/L (ref 5–34)
BILIRUBIN TOTAL: 1.01 mg/dL (ref 0.20–1.20)
BUN: 26.1 mg/dL — ABNORMAL HIGH (ref 7.0–26.0)
CALCIUM: 9.4 mg/dL (ref 8.4–10.4)
CO2: 26 meq/L (ref 22–29)
CREATININE: 1.4 mg/dL — AB (ref 0.7–1.3)
Chloride: 111 mEq/L — ABNORMAL HIGH (ref 98–109)
EGFR: 49 mL/min/{1.73_m2} — AB (ref >90)
Glucose: 115 mg/dl (ref 70–140)
Potassium: 3.7 mEq/L (ref 3.5–5.1)
Sodium: 144 mEq/L (ref 136–145)
TOTAL PROTEIN: 6.2 g/dL — AB (ref 6.4–8.3)

## 2017-06-25 LAB — CBC & DIFF AND RETIC
BASO%: 0.7 % (ref 0.0–2.0)
BASOS ABS: 0 10*3/uL (ref 0.0–0.1)
EOS%: 2.3 % (ref 0.0–7.0)
Eosinophils Absolute: 0.1 10*3/uL (ref 0.0–0.5)
HEMATOCRIT: 36 % — AB (ref 38.4–49.9)
HGB: 11.5 g/dL — ABNORMAL LOW (ref 13.0–17.1)
IMMATURE RETIC FRACT: 5.4 % (ref 3.00–10.60)
LYMPH%: 12.4 % — AB (ref 14.0–49.0)
MCH: 30.4 pg (ref 27.2–33.4)
MCHC: 31.9 g/dL — ABNORMAL LOW (ref 32.0–36.0)
MCV: 95.2 fL (ref 79.3–98.0)
MONO#: 0.4 10*3/uL (ref 0.1–0.9)
MONO%: 9.1 % (ref 0.0–14.0)
NEUT#: 3.2 10*3/uL (ref 1.5–6.5)
NEUT%: 75.5 % — AB (ref 39.0–75.0)
PLATELETS: 122 10*3/uL — AB (ref 140–400)
RBC: 3.78 10*6/uL — AB (ref 4.20–5.82)
RDW: 14.7 % — ABNORMAL HIGH (ref 11.0–14.6)
Retic %: 0.9 % (ref 0.80–1.80)
Retic Ct Abs: 34.02 10*3/uL — ABNORMAL LOW (ref 34.80–93.90)
WBC: 4.3 10*3/uL (ref 4.0–10.3)
lymph#: 0.5 10*3/uL — ABNORMAL LOW (ref 0.9–3.3)

## 2017-06-25 MED ORDER — BORTEZOMIB CHEMO SQ INJECTION 3.5 MG (2.5MG/ML)
1.3000 mg/m2 | Freq: Once | INTRAMUSCULAR | Status: AC
  Administered 2017-06-25: 2.5 mg via SUBCUTANEOUS
  Filled 2017-06-25: qty 2.5

## 2017-06-25 MED ORDER — PROCHLORPERAZINE MALEATE 10 MG PO TABS
10.0000 mg | ORAL_TABLET | Freq: Once | ORAL | Status: AC
  Administered 2017-06-25: 10 mg via ORAL

## 2017-06-25 MED ORDER — PROCHLORPERAZINE MALEATE 10 MG PO TABS
ORAL_TABLET | ORAL | Status: AC
  Filled 2017-06-25: qty 1

## 2017-07-09 ENCOUNTER — Other Ambulatory Visit (HOSPITAL_BASED_OUTPATIENT_CLINIC_OR_DEPARTMENT_OTHER): Payer: Medicare HMO

## 2017-07-09 ENCOUNTER — Ambulatory Visit (HOSPITAL_BASED_OUTPATIENT_CLINIC_OR_DEPARTMENT_OTHER): Payer: Medicare HMO

## 2017-07-09 VITALS — BP 90/65 | HR 86 | Temp 98.0°F | Resp 22

## 2017-07-09 DIAGNOSIS — Z5112 Encounter for antineoplastic immunotherapy: Secondary | ICD-10-CM

## 2017-07-09 DIAGNOSIS — C9000 Multiple myeloma not having achieved remission: Secondary | ICD-10-CM

## 2017-07-09 LAB — CBC & DIFF AND RETIC
BASO%: 0.6 % (ref 0.0–2.0)
Basophils Absolute: 0 10*3/uL (ref 0.0–0.1)
EOS ABS: 0.1 10*3/uL (ref 0.0–0.5)
EOS%: 2.3 % (ref 0.0–7.0)
HEMATOCRIT: 36.6 % — AB (ref 38.4–49.9)
HEMOGLOBIN: 11.9 g/dL — AB (ref 13.0–17.1)
IMMATURE RETIC FRACT: 6.2 % (ref 3.00–10.60)
LYMPH%: 15.9 % (ref 14.0–49.0)
MCH: 30.5 pg (ref 27.2–33.4)
MCHC: 32.5 g/dL (ref 32.0–36.0)
MCV: 93.8 fL (ref 79.3–98.0)
MONO#: 0.5 10*3/uL (ref 0.1–0.9)
MONO%: 9.3 % (ref 0.0–14.0)
NEUT%: 71.9 % (ref 39.0–75.0)
NEUTROS ABS: 3.5 10*3/uL (ref 1.5–6.5)
PLATELETS: 127 10*3/uL — AB (ref 140–400)
RBC: 3.9 10*6/uL — AB (ref 4.20–5.82)
RDW: 14.4 % (ref 11.0–14.6)
Retic %: 0.82 % (ref 0.80–1.80)
Retic Ct Abs: 31.98 10*3/uL — ABNORMAL LOW (ref 34.80–93.90)
WBC: 4.8 10*3/uL (ref 4.0–10.3)
lymph#: 0.8 10*3/uL — ABNORMAL LOW (ref 0.9–3.3)
nRBC: 0 % (ref 0–0)

## 2017-07-09 LAB — COMPREHENSIVE METABOLIC PANEL
ALBUMIN: 3.9 g/dL (ref 3.5–5.0)
ALT: 23 U/L (ref 0–55)
ANION GAP: 9 meq/L (ref 3–11)
AST: 26 U/L (ref 5–34)
Alkaline Phosphatase: 92 U/L (ref 40–150)
BILIRUBIN TOTAL: 1.24 mg/dL — AB (ref 0.20–1.20)
BUN: 24.4 mg/dL (ref 7.0–26.0)
CALCIUM: 9.6 mg/dL (ref 8.4–10.4)
CO2: 24 mEq/L (ref 22–29)
CREATININE: 1.4 mg/dL — AB (ref 0.7–1.3)
Chloride: 110 mEq/L — ABNORMAL HIGH (ref 98–109)
EGFR: 52 mL/min/{1.73_m2} — AB (ref >90)
Glucose: 94 mg/dl (ref 70–140)
Potassium: 3.9 mEq/L (ref 3.5–5.1)
Sodium: 143 mEq/L (ref 136–145)
TOTAL PROTEIN: 6.7 g/dL (ref 6.4–8.3)

## 2017-07-09 MED ORDER — PROCHLORPERAZINE MALEATE 10 MG PO TABS
10.0000 mg | ORAL_TABLET | Freq: Once | ORAL | Status: AC
  Administered 2017-07-09: 10 mg via ORAL

## 2017-07-09 MED ORDER — SODIUM CHLORIDE 0.9 % IV SOLN
60.0000 mg | Freq: Once | INTRAVENOUS | Status: AC
  Administered 2017-07-09: 60 mg via INTRAVENOUS
  Filled 2017-07-09: qty 10

## 2017-07-09 MED ORDER — BORTEZOMIB CHEMO SQ INJECTION 3.5 MG (2.5MG/ML)
1.3000 mg/m2 | Freq: Once | INTRAMUSCULAR | Status: AC
  Administered 2017-07-09: 2.5 mg via SUBCUTANEOUS
  Filled 2017-07-09: qty 2.5

## 2017-07-09 MED ORDER — SODIUM CHLORIDE 0.9 % IV SOLN
INTRAVENOUS | Status: AC
  Administered 2017-07-09: 14:00:00 via INTRAVENOUS

## 2017-07-09 MED ORDER — PROCHLORPERAZINE MALEATE 10 MG PO TABS
ORAL_TABLET | ORAL | Status: AC
  Filled 2017-07-09: qty 1

## 2017-07-09 NOTE — Patient Instructions (Signed)
[place] Discharge Instructions for Patients Receiving Chemotherapy  Today you received the following chemotherapy agents: Velcade  To help prevent nausea and vomiting after your treatment, we encourage you to take your nausea medication as directed.    If you develop nausea and vomiting that is not controlled by your nausea medication, call the clinic.   BELOW ARE SYMPTOMS THAT SHOULD BE REPORTED IMMEDIATELY:  *FEVER GREATER THAN 100.5 F  *CHILLS WITH OR WITHOUT FEVER  NAUSEA AND VOMITING THAT IS NOT CONTROLLED WITH YOUR NAUSEA MEDICATION  *UNUSUAL SHORTNESS OF BREATH  *UNUSUAL BRUISING OR BLEEDING  TENDERNESS IN MOUTH AND THROAT WITH OR WITHOUT PRESENCE OF ULCERS  *URINARY PROBLEMS  *BOWEL PROBLEMS  UNUSUAL RASH Items with * indicate a potential emergency and should be followed up as soon as possible.  Feel free to call the clinic you have any questions or concerns. The clinic phone number is [phone].  Please show the [place] at check-in to the Emergency Department and triage nurse.   Pamidronate injection What is this medicine? PAMIDRONATE (pa mi DROE nate) slows calcium loss from bones. It is used to treat high calcium blood levels from cancer or Paget's disease. It is also used to treat bone pain and prevent fractures from certain cancers that have spread to the bone. This medicine may be used for other purposes; ask your health care provider or pharmacist if you have questions. COMMON BRAND NAME(S): Aredia What should I tell my health care provider before I take this medicine? They need to know if you have any of these conditions: -aspirin-sensitive asthma -dental disease -kidney disease -an unusual or allergic reaction to pamidronate, other medicines, foods, dyes, or preservatives -pregnant or trying to get pregnant -breast-feeding How should I use this medicine? This medicine is for infusion into a vein. It is given by a health  care professional in a hospital or clinic setting. Talk to your pediatrician regarding the use of this medicine in children. This medicine is not approved for use in children. Overdosage: If you think you have taken too much of this medicine contact a poison control center or emergency room at once. NOTE: This medicine is only for you. Do not share this medicine with others. What if I miss a dose? This does not apply. What may interact with this medicine? -certain antibiotics given by injection -medicines for inflammation or pain like ibuprofen, naproxen -some diuretics like bumetanide, furosemide -cyclosporine -parathyroid hormone -tacrolimus -teriparatide -thalidomide This list may not describe all possible interactions. Give your health care provider a list of all the medicines, herbs, non-prescription drugs, or dietary supplements you use. Also tell them if you smoke, drink alcohol, or use illegal drugs. Some items may interact with your medicine. What should I watch for while using this medicine? Visit your doctor or health care professional for regular checkups. It may be some time before you see the benefit from this medicine. Do not stop taking your medicine unless your doctor tells you to. Your doctor may order blood tests or other tests to see how you are doing. Women should inform their doctor if they wish to become pregnant or think they might be pregnant. There is a potential for serious side effects to an unborn child. Talk to your health care professional or pharmacist for more information. You should make sure that you get enough calcium and vitamin D while you are taking this medicine. Discuss the foods you eat and the vitamins you take  with your health care professional. Some people who take this medicine have severe bone, joint, and/or muscle pain. This medicine may also increase your risk for a broken thigh bone. Tell your doctor right away if you have pain in your upper leg  or groin. Tell your doctor if you have any pain that does not go away or that gets worse. What side effects may I notice from receiving this medicine? Side effects that you should report to your doctor or health care professional as soon as possible: -allergic reactions like skin rash, itching or hives, swelling of the face, lips, or tongue -black or tarry stools -changes in vision -eye inflammation, pain -high blood pressure -jaw pain, especially burning or cramping -muscle weakness -numb, tingling pain -swelling of feet or hands -trouble passing urine or change in the amount of urine -unable to move easily Side effects that usually do not require medical attention (report to your doctor or health care professional if they continue or are bothersome): -bone, joint, or muscle pain -constipation -dizzy, drowsy -fever -headache -loss of appetite -nausea, vomiting -pain at site where injected This list may not describe all possible side effects. Call your doctor for medical advice about side effects. You may report side effects to FDA at 1-800-FDA-1088. Where should I keep my medicine? This drug is given in a hospital or clinic and will not be stored at home. NOTE: This sheet is a summary. It may not cover all possible information. If you have questions about this medicine, talk to your doctor, pharmacist, or health care provider.  2018 Elsevier/Gold Standard (2011-04-04 08:49:49)

## 2017-07-23 ENCOUNTER — Other Ambulatory Visit: Payer: Self-pay | Admitting: Hematology

## 2017-07-23 ENCOUNTER — Ambulatory Visit (HOSPITAL_BASED_OUTPATIENT_CLINIC_OR_DEPARTMENT_OTHER): Payer: Medicare HMO

## 2017-07-23 ENCOUNTER — Other Ambulatory Visit (HOSPITAL_BASED_OUTPATIENT_CLINIC_OR_DEPARTMENT_OTHER): Payer: Medicare HMO

## 2017-07-23 VITALS — BP 115/78 | HR 60 | Temp 98.4°F | Resp 20

## 2017-07-23 DIAGNOSIS — C9000 Multiple myeloma not having achieved remission: Secondary | ICD-10-CM | POA: Diagnosis not present

## 2017-07-23 DIAGNOSIS — Z5112 Encounter for antineoplastic immunotherapy: Secondary | ICD-10-CM

## 2017-07-23 LAB — CBC & DIFF AND RETIC
BASO%: 0.8 % (ref 0.0–2.0)
Basophils Absolute: 0 10*3/uL (ref 0.0–0.1)
EOS ABS: 0.1 10*3/uL (ref 0.0–0.5)
EOS%: 2.6 % (ref 0.0–7.0)
HCT: 34.1 % — ABNORMAL LOW (ref 38.4–49.9)
HEMOGLOBIN: 10.8 g/dL — AB (ref 13.0–17.1)
IMMATURE RETIC FRACT: 6.6 % (ref 3.00–10.60)
LYMPH#: 0.6 10*3/uL — AB (ref 0.9–3.3)
LYMPH%: 16.1 % (ref 14.0–49.0)
MCH: 29.6 pg (ref 27.2–33.4)
MCHC: 31.7 g/dL — ABNORMAL LOW (ref 32.0–36.0)
MCV: 93.4 fL (ref 79.3–98.0)
MONO#: 0.3 10*3/uL (ref 0.1–0.9)
MONO%: 7.5 % (ref 0.0–14.0)
NEUT%: 73 % (ref 39.0–75.0)
NEUTROS ABS: 2.8 10*3/uL (ref 1.5–6.5)
Platelets: 126 10*3/uL — ABNORMAL LOW (ref 140–400)
RBC: 3.65 10*6/uL — ABNORMAL LOW (ref 4.20–5.82)
RDW: 14.4 % (ref 11.0–14.6)
RETIC %: 0.82 % (ref 0.80–1.80)
Retic Ct Abs: 29.93 10*3/uL — ABNORMAL LOW (ref 34.80–93.90)
WBC: 3.9 10*3/uL — AB (ref 4.0–10.3)

## 2017-07-23 LAB — COMPREHENSIVE METABOLIC PANEL
ALT: 19 U/L (ref 0–55)
AST: 28 U/L (ref 5–34)
Albumin: 3.4 g/dL — ABNORMAL LOW (ref 3.5–5.0)
Alkaline Phosphatase: 117 U/L (ref 40–150)
Anion Gap: 7 mEq/L (ref 3–11)
BILIRUBIN TOTAL: 0.72 mg/dL (ref 0.20–1.20)
BUN: 23.1 mg/dL (ref 7.0–26.0)
CHLORIDE: 112 meq/L — AB (ref 98–109)
CO2: 26 meq/L (ref 22–29)
Calcium: 9.2 mg/dL (ref 8.4–10.4)
Creatinine: 1.4 mg/dL — ABNORMAL HIGH (ref 0.7–1.3)
EGFR: 49 mL/min/{1.73_m2} — AB (ref >90)
GLUCOSE: 104 mg/dL (ref 70–140)
Potassium: 4.2 mEq/L (ref 3.5–5.1)
SODIUM: 145 meq/L (ref 136–145)
TOTAL PROTEIN: 6.1 g/dL — AB (ref 6.4–8.3)

## 2017-07-23 MED ORDER — BORTEZOMIB CHEMO SQ INJECTION 3.5 MG (2.5MG/ML)
1.3000 mg/m2 | Freq: Once | INTRAMUSCULAR | Status: AC
  Administered 2017-07-23: 2.5 mg via SUBCUTANEOUS
  Filled 2017-07-23: qty 2.5

## 2017-07-23 MED ORDER — PROCHLORPERAZINE MALEATE 10 MG PO TABS
10.0000 mg | ORAL_TABLET | Freq: Once | ORAL | Status: AC
  Administered 2017-07-23: 10 mg via ORAL

## 2017-07-23 MED ORDER — PROCHLORPERAZINE MALEATE 10 MG PO TABS
ORAL_TABLET | ORAL | Status: AC
  Filled 2017-07-23: qty 1

## 2017-07-23 NOTE — Patient Instructions (Signed)
[facility] Discharge Instructions for Patients Receiving Chemotherapy  Today you received the following chemotherapy agents Velcade.  To help prevent nausea and vomiting after your treatment, we encourage you to take your nausea medication as directed.  If you develop nausea and vomiting that is not controlled by your nausea medication, call the clinic.   BELOW ARE SYMPTOMS THAT SHOULD BE REPORTED IMMEDIATELY:  *FEVER GREATER THAN 100.5 F  *CHILLS WITH OR WITHOUT FEVER  NAUSEA AND VOMITING THAT IS NOT CONTROLLED WITH YOUR NAUSEA MEDICATION  *UNUSUAL SHORTNESS OF BREATH  *UNUSUAL BRUISING OR BLEEDING  TENDERNESS IN MOUTH AND THROAT WITH OR WITHOUT PRESENCE OF ULCERS  *URINARY PROBLEMS  *BOWEL PROBLEMS  UNUSUAL RASH Items with * indicate a potential emergency and should be followed up as soon as possible.  Feel free to call the clinic should you have any questions or concerns. The clinic phone number is [phone].  Please show the CHEMO ALERT CARD at check-in to the Emergency Department and triage nurse.   

## 2017-07-23 NOTE — Progress Notes (Signed)
[name]    HEMATOLOGY/ONCOLOGY CLINIC NOTE  Date of Service: 07/23/17  Patient Care Team: Sandi Mariscal, MD as PCP - General (Internal Medicine)  CHIEF COMPLAINTS/PURPOSE OF CONSULTATION:  f/u for Multiple Myeloma  HISTORY OF PRESENTING ILLNESS:  plz see previous note for details on HPI  INTERVAL HISTORY  Richard Vang is here for Richard scheduled followup for myeloma along with Richard Vang for reevaluation. He is tolerating maintenance treatment with Velcade well.  We discussed and he is okay with switching to [facility] maintenance treatment.  On review of systems, pt denies fever, chills, weight loss, decreased appetite, decreased energy levels. Denies pain. Pt denies abdominal pain, nausea, vomiting.   Notes feet neuropathy stable/improved. Notes no diarrhea and energy levels much improved. No fevers/chills/night sweats/new bone pains. No other acute new symptoms.  Accompanied by Richard Vang and grand daughter.  MEDICAL HISTORY:  Past Medical History:  Diagnosis Date  . Arthritis    "left shoulder" (07/31/2016)  . Coronary artery disease   . GERD (gastroesophageal reflux disease)   . Heart murmur   . High cholesterol   . Hypertension   . Multiple myeloma ([place])   . Sleep apnea    "probably; having test in November" (07/31/2016)  . Type II diabetes mellitus ([place])     SURGICAL HISTORY: Past Surgical History:  Procedure Laterality Date  . CARDIAC CATHETERIZATION N/A 07/31/2016   Procedure: Left Heart Cath and Coronary Angiography;  Surgeon: Lorretta Harp, MD;  Location: [facility];  Service: Cardiovascular;  Laterality: N/A;  . CARDIAC CATHETERIZATION N/A 07/31/2016   Procedure: Coronary Stent Intervention;  Surgeon: Lorretta Harp, MD;  Location: [place] [facility];  Service: Cardiovascular;  Laterality: N/A;  . CORONARY ANGIOPLASTY    . SHOULDER SURGERY Left 1973   "put pin in it where it had separated"   . TONSILLECTOMY  ~ 1956    SOCIAL HISTORY: Social History    Social History  . Marital status: Divorced    Spouse name: N/A  . Number of children: N/A  . Years of education: N/A   Occupational History  . Not on file.   Social History Main Topics  . Smoking status: Never Smoker  . Smokeless tobacco: Never Used  . Alcohol use Yes     Comment: 07/31/2016 "nothing since 2002"  . Drug use: No  . Sexual activity: Not Currently   Other Topics Concern  . Not on file   Social History Narrative  . No narrative on file    FAMILY HISTORY: Family History  Problem Relation Age of Onset  . Hypertension Other     ALLERGIES:  has No Known Allergies.  MEDICATIONS:  Current Outpatient Prescriptions  Medication Sig Dispense Refill  . acyclovir (ZOVIRAX) 400 MG tablet TAKE 1 TABLET BY MOUTH TWICE DAILY 60 tablet 3  . amLODipine (NORVASC) 5 MG tablet Take 5 mg by mouth daily.    [name] atorvastatin (LIPITOR) 40 MG tablet Take 1 tablet (40 mg total) by mouth at bedtime. 30 tablet 12  . carvedilol (COREG) 12.5 MG tablet Take 12.5 mg by mouth 2 (two) times daily with a meal.    . Cholecalciferol (VITAMIN D3) 5000 units CAPS Take 1 capsule by mouth daily.     . clopidogrel (PLAVIX) 75 MG tablet Take 1 tablet (75 mg total) by mouth daily with breakfast. 30 tablet 12  . Cyanocobalamin (B-12) 1000 MCG SUBL Place 1,000 mcg under the tongue daily. 30 each 3  . escitalopram (  LEXAPRO) 5 MG tablet Take 5 mg by mouth daily.    . feeding supplement, ENSURE ENLIVE, (ENSURE ENLIVE) LIQD Take 237 mLs by mouth 2 (two) times daily between meals. 237 mL 12  . furosemide (LASIX) 40 MG tablet     . insulin aspart (NOVOLOG) 100 UNIT/ML injection Correction coverage: Sensitive (thin, NPO, renal)  CBG < 70: implement hypoglycemia protocol  CBG 70 - 120: 0 units  CBG 121 - 150: 1 unit  CBG 151 - 200: 2 units  CBG 201 - 250: 3 units  CBG 251 - 300: 5 units  CBG 301 - 350: 7 units  CBG 351 - 400 9 units  CBG > 400 call MD and obtain STAT lab verification 10 mL 11  .  lactobacillus acidophilus & bulgar (LACTINEX) chewable tablet Chew 1 tablet by mouth 3 (three) times daily with meals. 30 tablet 0  . montelukast (SINGULAIR) 10 MG tablet Take 10 mg by mouth at bedtime.    . nitroGLYCERIN (NITROSTAT) 0.3 MG SL tablet Place 0.3 mg under the tongue as needed for chest pain.    [name] omeprazole (PRILOSEC) 40 MG capsule Take 40 mg by mouth daily.    . ondansetron (ZOFRAN) 8 MG tablet Take 1 tablet (8 mg total) by mouth 2 (two) times daily as needed for refractory nausea / vomiting. Starting on days 4 and 11 not on day 8. 30 tablet 1  . oxyCODONE-acetaminophen (PERCOCET) 10-325 MG tablet Take 1 tablet by mouth 2 (two) times daily.    . polyethylene glycol (MIRALAX / GLYCOLAX) packet Take 17 g by mouth daily.    . potassium chloride SA (K-DUR,KLOR-CON) 20 MEQ tablet Take 1 tablet (20 mEq total) by mouth daily.    . prochlorperazine (COMPAZINE) 10 MG tablet Take 1 tablet (10 mg total) by mouth every 6 (six) hours as needed (Nausea or vomiting). (Patient not taking: Reported on 05/07/2017) 30 tablet 1  . warfarin (COUMADIN) 6 MG tablet Take 4 mg by mouth every morning.      No current facility-administered medications for this visit.    Facility-Administered Medications Ordered in Other Visits  Medication Dose Route Frequency Provider Last Rate Last Dose  . 0.9 %  sodium chloride infusion   Intravenous Continuous Brunetta Genera, MD 10 mL/hr at 07/09/17 1423      REVIEW OF SYSTEMS:    10 Point review of Systems was done is negative except as noted above.  PHYSICAL EXAMINATION:  ECOG PERFORMANCE STATUS: 2  . Vitals:   07/30/17 1346  BP: (!) 95/58  Pulse: 65  Resp: 18  Temp: 97.9 F (36.6 C)  SpO2: 98%   Filed Weights   07/30/17 1346  Weight: 192 lb 8 oz (87.3 kg)   .Body mass index is 27.62 kg/m.  GENERAL: NAD SKIN: no acute rsahes OROPHARYNX: MMM  NECK: supple,+ JVD, LYMPH: no palpable lymphadenopathy in the cervical, axillary or  inguinal LUNGS: CTA b/l HEART: S1-S2 irreg ABDOMEN: abdomen soft, non-tender, normoactive bowel sounds, no hepatosplenomegaly palpable PSYCH: Alert and oriented 3  NEURO: non focal. LE : b/l trace pedal edema   LABORATORY DATA:  I have reviewed the data as listed . CBC Latest Ref Rng & Units 07/30/2017 07/23/2017 07/09/2017  WBC 4.0 - 10.3 10e3/uL 4.5 3.9(L) 4.8  Hemoglobin 13.0 - 17.1 g/dL 11.5(L) 10.8(L) 11.9(L)  Hematocrit 38.4 - 49.9 % 36.0(L) 34.1(L) 36.6(L)  Platelets 140 - 400 10e3/uL 121(L) 126(L) 127(L)   . CBC    Component  Value Date/Time   WBC 4.5 07/30/2017 1328   WBC 3.4 (L) 10/08/2016 0234   RBC 3.89 (L) 07/30/2017 1328   RBC 3.05 (L) 10/08/2016 0234   HGB 11.5 (L) 07/30/2017 1328   HCT 36.0 (L) 07/30/2017 1328   PLT 121 (L) 07/30/2017 1328   MCV 92.4 07/30/2017 1328   MCH 29.6 07/30/2017 1328   MCH 30.2 10/08/2016 0234   MCHC 32.0 07/30/2017 1328   MCHC 30.4 10/08/2016 0234   RDW 15.0 (H) 07/30/2017 1328   LYMPHSABS 0.5 (L) 07/30/2017 1328   MONOABS 0.4 07/30/2017 1328   EOSABS 0.1 07/30/2017 1328   BASOSABS 0.0 07/30/2017 1328    . CMP Latest Ref Rng & Units 07/30/2017 07/30/2017 07/23/2017  Glucose 70 - 140 mg/dl 99 - 104  BUN 7.0 - 26.0 mg/dL 27.1(H) - 23.1  Creatinine 0.7 - 1.3 mg/dL 1.5(H) - 1.4(H)  Sodium 136 - 145 mEq/L 146(H) - 145  Potassium 3.5 - 5.1 mEq/L 4.0 - 4.2  Chloride 101 - 111 mmol/L - - -  CO2 22 - 29 mEq/L 26 - 26  Calcium 8.4 - 10.4 mg/dL 9.2 - 9.2  Total Protein 6.0 - 8.5 g/dL 6.5 6.3 6.1(L)  Total Bilirubin 0.20 - 1.20 mg/dL 0.91 - 0.72  Alkaline Phos 40 - 150 U/L 120 - 117  AST 5 - 34 U/L 26 - 28  ALT 0 - 55 U/L 19 - 19      RADIOGRAPHIC STUDIES: I have personally reviewed the radiological images as listed and agreed with the findings in the report. No results found.  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  K/L 95, No M spike on SPEP -- suggests light chain MM Cytogenetics - normal male chromosomes FISH- +11 and 13q-/-13  Patient is s/p 1 cycle of Vd and Serum free kappa LC has decreased from 700 to 203.6 with improvement in Richard K/L ratio from 94.59 to 29. Completed 2nd cycle of treatment with Vd + Cytoxan (246m/m2) Completed 3rd cycle of treatment with Vd + Cytoxan (2064mm2) - cytoxan held D15 due to thrombocytopenia Completed 4th of VCd Discontinued VCd after C5D8 due to intolerance with diarrhea and increasing neuropathy and fatigue with borderline functional status. -Patient did not tolerate maintenance Revlimid 10 mg by mouth daily due to grade 2-3 diarrhea. This was discontinued and Richard diarrhea has resolved. Plan -tolerating maintenance Velcade at this time q2weeks. SFLC suggest stable disease for the last 3-4 months. -switching to maintenance Ninalro 33m533m1/8/15 q28days when available. -. If worsening will need to consider other treatment options based on patients preference and functional status. -Continue B12 replacement -Continue acyclovir for shingles prophylaxis. -Continue aspirin -continue Pamidronate q4weeks -graduated from outpatient cancer rehabilitation                                     2) CAD s/p prox LAD DES PCI on 07/31/2016 with ischemic cardiomyopathy ejection fraction of 25-35%. Rpt ECHO  on 10/03/2016 shows improvement in EF to 55-60% with no RWMABN.  3) Macrocytic Anemia with moderate thrombocytopenia - due to MM and B12  def hgb stable around 11.5  5) Thrombocytopenia - due to MM and treatment --mild. --will monitor   6) B12 deficiency - Received [place] B12 daily while in the hospital. B12 levels today 529 Plan -cont SL B12 1038mg po daily  6) b/l Lower extremity swelling likely due to CHF an CKD. Much improved. Plan -continued adjustment and monitoring of diuretic therapy as per PCP and cardiology  .7) CAD s/p recent DES 8) h/o Afib with RVR on coumadin -continue mx per PCP and cardiology  9) Decubitus Ulcers on buttocks -healed   10) diarrhea - resolved. Stool studies for GI pathogen panel and C. Difficile neg S/p empiric treatment for c diff with po vancomycin Plan Resolved off Revlimid.  11) Elevated Alkaline phosphatase, AST, ALT. No abdominal pain no fevers. Previous CT abd in 09/2016 has shown signs of liver cirrhosis, choledocholithiasis.  ?hepatic congestion from CHF Plan -resolved on rpt labs today -monitor  Continue Maintainnce Velcade q2weeks setup next 2 months of treatment --> [facility]. Continue Aredia q4weeks setup next 2 months of treatment Labs q2weeks RTC with Dr KIrene Limboin 8 weeks with labs  All of the patients questions were answered with apparent satisfaction. The patient knows to call the clinic with any problems, questions or concerns.  I spent 20 minutes counseling the patient face to face. The total time spent in the appointment was 25 minutes and more than 50% was on counseling and direct patient cares.    GSullivan LoneMD MPleasantonAAHIVMS [facility] Residential CenterCSouth Bay HospitalHematology/Oncology Physician [place]ew[place] (Office):       [phone](Work cell):  [phone](Fax):           [phone] This document serves as a record of services personally performed by GSullivan Lone MD. It was created on her behalf by SSteva Colder a trained medical scribe. The creation of this record is based on the scribe's personal observations and the provider's  statements to them. This document has been checked and approved by the attending provider.

## 2017-07-30 ENCOUNTER — Other Ambulatory Visit: Payer: Self-pay

## 2017-07-30 ENCOUNTER — Encounter: Payer: Self-pay | Admitting: Hematology

## 2017-07-30 ENCOUNTER — Ambulatory Visit (HOSPITAL_BASED_OUTPATIENT_CLINIC_OR_DEPARTMENT_OTHER): Payer: Medicare HMO | Admitting: Hematology

## 2017-07-30 ENCOUNTER — Other Ambulatory Visit (HOSPITAL_BASED_OUTPATIENT_CLINIC_OR_DEPARTMENT_OTHER): Payer: Medicare HMO

## 2017-07-30 VITALS — BP 95/58 | HR 65 | Temp 97.9°F | Resp 18 | Ht 70.0 in | Wt 192.5 lb

## 2017-07-30 DIAGNOSIS — C9000 Multiple myeloma not having achieved remission: Secondary | ICD-10-CM

## 2017-07-30 DIAGNOSIS — E119 Type 2 diabetes mellitus without complications: Secondary | ICD-10-CM | POA: Diagnosis not present

## 2017-07-30 DIAGNOSIS — I10 Essential (primary) hypertension: Secondary | ICD-10-CM

## 2017-07-30 DIAGNOSIS — Z7189 Other specified counseling: Secondary | ICD-10-CM | POA: Insufficient documentation

## 2017-07-30 DIAGNOSIS — E538 Deficiency of other specified B group vitamins: Secondary | ICD-10-CM

## 2017-07-30 LAB — COMPREHENSIVE METABOLIC PANEL
ALBUMIN: 3.7 g/dL (ref 3.5–5.0)
ALT: 19 U/L (ref 0–55)
AST: 26 U/L (ref 5–34)
Alkaline Phosphatase: 120 U/L (ref 40–150)
Anion Gap: 9 mEq/L (ref 3–11)
BILIRUBIN TOTAL: 0.91 mg/dL (ref 0.20–1.20)
BUN: 27.1 mg/dL — AB (ref 7.0–26.0)
CALCIUM: 9.2 mg/dL (ref 8.4–10.4)
CO2: 26 mEq/L (ref 22–29)
Chloride: 111 mEq/L — ABNORMAL HIGH (ref 98–109)
Creatinine: 1.5 mg/dL — ABNORMAL HIGH (ref 0.7–1.3)
EGFR: 47 mL/min/{1.73_m2} — AB (ref >60)
Glucose: 99 mg/dl (ref 70–140)
POTASSIUM: 4 meq/L (ref 3.5–5.1)
Sodium: 146 mEq/L — ABNORMAL HIGH (ref 136–145)
Total Protein: 6.5 g/dL (ref 6.4–8.3)

## 2017-07-30 LAB — CBC WITH DIFFERENTIAL/PLATELET
BASO%: 0.8 % (ref 0.0–2.0)
BASOS ABS: 0 10*3/uL (ref 0.0–0.1)
EOS ABS: 0.1 10*3/uL (ref 0.0–0.5)
EOS%: 2.3 % (ref 0.0–7.0)
HEMATOCRIT: 36 % — AB (ref 38.4–49.9)
HEMOGLOBIN: 11.5 g/dL — AB (ref 13.0–17.1)
LYMPH#: 0.5 10*3/uL — AB (ref 0.9–3.3)
LYMPH%: 11.2 % — ABNORMAL LOW (ref 14.0–49.0)
MCH: 29.6 pg (ref 27.2–33.4)
MCHC: 32 g/dL (ref 32.0–36.0)
MCV: 92.4 fL (ref 79.3–98.0)
MONO#: 0.4 10*3/uL (ref 0.1–0.9)
MONO%: 8.1 % (ref 0.0–14.0)
NEUT#: 3.5 10*3/uL (ref 1.5–6.5)
NEUT%: 77.6 % — ABNORMAL HIGH (ref 39.0–75.0)
Platelets: 121 10*3/uL — ABNORMAL LOW (ref 140–400)
RBC: 3.89 10*6/uL — ABNORMAL LOW (ref 4.20–5.82)
RDW: 15 % — ABNORMAL HIGH (ref 11.0–14.6)
WBC: 4.5 10*3/uL (ref 4.0–10.3)

## 2017-07-30 MED ORDER — IXAZOMIB CITRATE 3 MG PO CAPS: 3.0000 mg | ORAL_CAPSULE | ORAL | 1 refills | Status: DC

## 2017-07-31 LAB — VITAMIN B12: Vitamin B12: 1337 pg/mL — ABNORMAL HIGH (ref 232–1245)

## 2017-08-01 LAB — KAPPA/LAMBDA LIGHT CHAINS
Ig Kappa Free Light Chain: 44.7 mg/L — ABNORMAL HIGH (ref 3.3–19.4)
Ig Lambda Free Light Chain: 13.4 mg/L (ref 5.7–26.3)
Kappa/Lambda FluidC Ratio: 3.34 — ABNORMAL HIGH (ref 0.26–1.65)

## 2017-08-03 ENCOUNTER — Telehealth: Payer: Self-pay | Admitting: Pharmacy Technician

## 2017-08-03 NOTE — Telephone Encounter (Signed)
Oral Oncology Patient Advocate Encounter  Received notification from [place] that prior authorization for Kennieth Rad is required.  PA submitted on CoverMyMeds Key [id number] Status is pending  Oral Oncology Clinic will continue to follow.  Fabio Asa. Melynda Keller, [place] Patient [place] [phone] 08/03/2017 12:11 PM

## 2017-08-04 LAB — MULTIPLE MYELOMA PANEL, SERUM
ALBUMIN SERPL ELPH-MCNC: 3.4 g/dL (ref 2.9–4.4)
Albumin/Glob SerPl: 1.2 (ref 0.7–1.7)
Alpha 1: 0.3 g/dL (ref 0.0–0.4)
Alpha2 Glob SerPl Elph-Mcnc: 0.7 g/dL (ref 0.4–1.0)
B-Globulin SerPl Elph-Mcnc: 1 g/dL (ref 0.7–1.3)
GAMMA GLOB SERPL ELPH-MCNC: 1 g/dL (ref 0.4–1.8)
GLOBULIN, TOTAL: 2.9 g/dL (ref 2.2–3.9)
IGA/IMMUNOGLOBULIN A, SERUM: 55 mg/dL — AB (ref 61–437)
IgG, Qn, Serum: 955 mg/dL (ref 700–1600)
IgM, Qn, Serum: 35 mg/dL (ref 20–172)
TOTAL PROTEIN: 6.3 g/dL (ref 6.0–8.5)

## 2017-08-05 ENCOUNTER — Telehealth: Payer: Self-pay | Admitting: Pharmacist

## 2017-08-05 DIAGNOSIS — C9000 Multiple myeloma not having achieved remission: Secondary | ICD-10-CM

## 2017-08-05 MED ORDER — IXAZOMIB CITRATE 3 MG PO CAPS: 3.0000 mg | ORAL_CAPSULE | ORAL | 1 refills | Status: DC

## 2017-08-05 NOTE — Telephone Encounter (Signed)
Oral Oncology Pharmacist Encounter  Received new prescription for Ninlaro for the maintenance treatment of multiple myeloma, planned duration until disease progression or unacceptable toxicity.  Labs from 07/30/17 assessed, OK for treatment. Noted SCr=1.5, CrCl~56 mL/min. No dose adjustment needed at this time based on renal funstion. Noted dose reduction to 66m weekly for better toleration. [facility] will [facility] to NMary Estheronce medication is available.  Current medication list in Epic reviewed, no DDIs with Ninlaro identified.  [facility]. Prior authorization has been approved and test claim at the pharmacy revealed copayment of $494.42. There are currently copayment grants available for patient's diagnosis. I LVM for patient with offer for medication update and initial counseling.  Noted patient in infusion tomorrow 08/06/17 for Aredia infusion. [facility]  [facility] On[facility] Clinic will continue to follow for medication acquisition, initial counseling and start date.  JJohny Drilling PharmD, BCPS, BCOP 08/05/2017 11:31 AM Oral Oncology Clinic [phone]

## 2017-08-05 NOTE — Telephone Encounter (Signed)
Oral Oncology Patient Advocate Encounter  Prior Authorization for Richard Vang has been approved.    PA# [id number] Effective dates: 08/04/2017 through 02/02/2018  Oral Oncology Clinic will continue to follow.   Fabio Asa. Melynda Keller, [place] Patient [place] [phone] 08/05/2017 8:06 AM

## 2017-08-06 ENCOUNTER — Ambulatory Visit (HOSPITAL_BASED_OUTPATIENT_CLINIC_OR_DEPARTMENT_OTHER): Payer: Medicare HMO

## 2017-08-06 DIAGNOSIS — Z7189 Other specified counseling: Secondary | ICD-10-CM

## 2017-08-06 DIAGNOSIS — C9000 Multiple myeloma not having achieved remission: Secondary | ICD-10-CM

## 2017-08-06 DIAGNOSIS — Z5112 Encounter for antineoplastic immunotherapy: Secondary | ICD-10-CM | POA: Diagnosis not present

## 2017-08-06 MED ORDER — PROCHLORPERAZINE MALEATE 10 MG PO TABS
ORAL_TABLET | ORAL | Status: AC
  Filled 2017-08-06: qty 1

## 2017-08-06 MED ORDER — HEPARIN SOD (PORK) LOCK FLUSH 100 UNIT/ML IV SOLN
500.0000 [IU] | Freq: Once | INTRAVENOUS | Status: DC | PRN
  Filled 2017-08-06: qty 5

## 2017-08-06 MED ORDER — SODIUM CHLORIDE 0.9% FLUSH
10.0000 mL | INTRAVENOUS | Status: DC | PRN
  Filled 2017-08-06: qty 10

## 2017-08-06 MED ORDER — ALTEPLASE 2 MG IJ SOLR
2.0000 mg | Freq: Once | INTRAMUSCULAR | Status: DC | PRN
  Filled 2017-08-06: qty 2

## 2017-08-06 MED ORDER — BORTEZOMIB CHEMO SQ INJECTION 3.5 MG (2.5MG/ML)
1.3000 mg/m2 | Freq: Once | INTRAMUSCULAR | Status: AC
  Administered 2017-08-06: 2.5 mg via SUBCUTANEOUS
  Filled 2017-08-06: qty 2.5

## 2017-08-06 MED ORDER — SODIUM CHLORIDE 0.9% FLUSH
3.0000 mL | Freq: Once | INTRAVENOUS | Status: DC | PRN
  Filled 2017-08-06: qty 10

## 2017-08-06 MED ORDER — SODIUM CHLORIDE 0.9 % IV SOLN
60.0000 mg | Freq: Once | INTRAVENOUS | Status: AC
  Administered 2017-08-06: 60 mg via INTRAVENOUS
  Filled 2017-08-06: qty 10

## 2017-08-06 MED ORDER — PROCHLORPERAZINE MALEATE 10 MG PO TABS
10.0000 mg | ORAL_TABLET | Freq: Once | ORAL | Status: AC
  Administered 2017-08-06: 10 mg via ORAL

## 2017-08-06 MED ORDER — HEPARIN SOD (PORK) LOCK FLUSH 100 UNIT/ML IV SOLN
250.0000 [IU] | Freq: Once | INTRAVENOUS | Status: DC | PRN
  Filled 2017-08-06: qty 5

## 2017-08-06 NOTE — Telephone Encounter (Signed)
Oral Chemotherapy Pharmacist Encounter   I spoke with patient and SO, Richard Vang, in infusion room for overview of: Ninlaro.  Pt is doing well. Counseled patient on administration, dosing, side effects, monitoring, drug-food interactions, safe handling, storage, and disposal.  Patient will take Ninlaro 3mg  capsules, 1 capsule by mouth once weekly. Take on an empty stomach, 1 hour before or 2 hours after a meal. Take on days 1, 8, and 15 of each 28 day cycle. Ninlaro start date: 08/11/17  Side effects of Ninlaro include but not limited to: peripheral edema, constipation, nausea, vomiting, decreased blood counts, and peripheral neuropathy.    Side effects of Revlimid include but not limited to: GI upset, fatigue, rash, peripheral edema, drug fever, and muscle pains.  Reviewed with patient importance of keeping a medication schedule and plan for any missed doses.  Richard Vang voiced understanding and appreciation.   All questions answered. Medication reconciliation performed and medication/allergy list updated.  I was able to obtain [facility] [place] copayment grant to reduce patient's out of pocket costs for [employer] to [place] fund name: Multiple  Myeloma Amount: $10,000 ID: [id number] Effective dates: 08/06/17-08/06/18 BIN: [id number] PCN: [id number] Group: [id number]  Ninlaro will be ordered by pharmacy and shipped directly to patient's home. Expected delivery Monday 10/22 or Tuesday 10/23. Patient will start Ninlaro as soon as he receives it.  Patient appointments updated with collaborative practice RN and printed copy given to [facility].  Patient knows to call the office with questions or concerns. Oral Oncology Clinic will continue to follow.  Thank you,  [name], PharmD, BCPS, BCOP 08/06/2017 2:35 PM Oral Oncology Clinic [phone]

## 2017-08-06 NOTE — Patient Instructions (Signed)
Bortezomib injection What is this medicine? BORTEZOMIB (bor TEZ oh mib) is a medicine that targets proteins in cancer cells and stops the cancer cells from growing. It is used to treat multiple myeloma and mantle-cell lymphoma. This medicine may be used for other purposes; ask your health care provider or pharmacist if you have questions. COMMON BRAND NAME(S): Velcade What should I tell my health care provider before I take this medicine? They need to know if you have any of these conditions: -diabetes -heart disease -irregular heartbeat -liver disease -on hemodialysis -low blood counts, like low white blood cells, platelets, or hemoglobin -peripheral neuropathy -taking medicine for blood pressure -an unusual or allergic reaction to bortezomib, mannitol, boron, other medicines, foods, dyes, or preservatives -pregnant or trying to get pregnant -breast-feeding How should I use this medicine? This medicine is for injection into a vein or for injection under the skin. It is given by a health care professional in a hospital or clinic setting. Talk to your pediatrician regarding the use of this medicine in children. Special care may be needed. Overdosage: If you think you have taken too much of this medicine contact a poison control center or emergency room at once. NOTE: This medicine is only for you. Do not share this medicine with others. What if I miss a dose? It is important not to miss your dose. Call your doctor or health care professional if you are unable to keep an appointment. What may interact with this medicine? This medicine may interact with the following medications: -ketoconazole -rifampin -ritonavir -St. John's Wort This list may not describe all possible interactions. Give your health care provider a list of all the medicines, herbs, non-prescription drugs, or dietary supplements you use. Also tell them if you smoke, drink alcohol, or use illegal drugs. Some items may  interact with your medicine. What should I watch for while using this medicine? You may get drowsy or dizzy. Do not drive, use machinery, or do anything that needs mental alertness until you know how this medicine affects you. Do not stand or sit up quickly, especially if you are an older patient. This reduces the risk of dizzy or fainting spells. In some cases, you may be given additional medicines to help with side effects. Follow all directions for their use. Call your doctor or health care professional for advice if you get a fever, chills or sore throat, or other symptoms of a cold or flu. Do not treat yourself. This drug decreases your body's ability to fight infections. Try to avoid being around people who are sick. This medicine may increase your risk to bruise or bleed. Call your doctor or health care professional if you notice any unusual bleeding. You may need blood work done while you are taking this medicine. In some patients, this medicine may cause a serious brain infection that may cause death. If you have any problems seeing, thinking, speaking, walking, or standing, tell your doctor right away. If you cannot reach your doctor, urgently seek other source of medical care. Check with your doctor or health care professional if you get an attack of severe diarrhea, nausea and vomiting, or if you sweat a lot. The loss of too much body fluid can make it dangerous for you to take this medicine. Do not become pregnant while taking this medicine or for at least 2 months after stopping it. Women should inform their doctor if they wish to become pregnant or think they might be pregnant. Men should not  father a child while taking this medicine and for at least 2 months after stopping it. There is a potential for serious side effects to an unborn child. Talk to your health care professional or pharmacist for more information. Do not breast-feed an infant while taking this medicine or for 2 months after  stopping it. This medicine may interfere with the ability to have a child. You should talk with your doctor or health care professional if you are concerned about your fertility. What side effects may I notice from receiving this medicine? Side effects that you should report to your doctor or health care professional as soon as possible: -allergic reactions like skin rash, itching or hives, swelling of the face, lips, or tongue -breathing problems -changes in hearing -changes in vision -fast, irregular heartbeat -feeling faint or lightheaded, falls -pain, tingling, numbness in the hands or feet -right upper belly pain -seizures -swelling of the ankles, feet, hands -unusual bleeding or bruising -unusually weak or tired -vomiting -yellowing of the eyes or skin Side effects that usually do not require medical attention (report to your doctor or health care professional if they continue or are bothersome): -changes in emotions or moods -constipation -diarrhea -loss of appetite -headache -irritation at site where injected -nausea This list may not describe all possible side effects. Call your doctor for medical advice about side effects. You may report side effects to FDA at 1-800-FDA-1088. Where should I keep my medicine? This drug is given in a hospital or clinic and will not be stored at home. NOTE: This sheet is a summary. It may not cover all possible information. If you have questions about this medicine, talk to your doctor, pharmacist, or health care provider.  2018 Elsevier/Gold Standard (2016-09-04 15:53:51) Pamidronate injection What is this medicine? PAMIDRONATE (pa mi DROE nate) slows calcium loss from bones. It is used to treat high calcium blood levels from cancer or Paget's disease. It is also used to treat bone pain and prevent fractures from certain cancers that have spread to the bone. This medicine may be used for other purposes; ask your health care provider or  pharmacist if you have questions. COMMON BRAND NAME(S): Aredia What should I tell my health care provider before I take this medicine? They need to know if you have any of these conditions: -aspirin-sensitive asthma -dental disease -kidney disease -an unusual or allergic reaction to pamidronate, other medicines, foods, dyes, or preservatives -pregnant or trying to get pregnant -breast-feeding How should I use this medicine? This medicine is for infusion into a vein. It is given by a health care professional in a hospital or clinic setting. Talk to your pediatrician regarding the use of this medicine in children. This medicine is not approved for use in children. Overdosage: If you think you have taken too much of this medicine contact a poison control center or emergency room at once. NOTE: This medicine is only for you. Do not share this medicine with others. What if I miss a dose? This does not apply. What may interact with this medicine? -certain antibiotics given by injection -medicines for inflammation or pain like ibuprofen, naproxen -some diuretics like bumetanide, furosemide -cyclosporine -parathyroid hormone -tacrolimus -teriparatide -thalidomide This list may not describe all possible interactions. Give your health care provider a list of all the medicines, herbs, non-prescription drugs, or dietary supplements you use. Also tell them if you smoke, drink alcohol, or use illegal drugs. Some items may interact with your medicine. What should I watch for  while using this medicine? Visit your doctor or health care professional for regular checkups. It may be some time before you see the benefit from this medicine. Do not stop taking your medicine unless your doctor tells you to. Your doctor may order blood tests or other tests to see how you are doing. Women should inform their doctor if they wish to become pregnant or think they might be pregnant. There is a potential for serious  side effects to an unborn child. Talk to your health care professional or pharmacist for more information. You should make sure that you get enough calcium and vitamin D while you are taking this medicine. Discuss the foods you eat and the vitamins you take with your health care professional. Some people who take this medicine have severe bone, joint, and/or muscle pain. This medicine may also increase your risk for a broken thigh bone. Tell your doctor right away if you have pain in your upper leg or groin. Tell your doctor if you have any pain that does not go away or that gets worse. What side effects may I notice from receiving this medicine? Side effects that you should report to your doctor or health care professional as soon as possible: -allergic reactions like skin rash, itching or hives, swelling of the face, lips, or tongue -black or tarry stools -changes in vision -eye inflammation, pain -high blood pressure -jaw pain, especially burning or cramping -muscle weakness -numb, tingling pain -swelling of feet or hands -trouble passing urine or change in the amount of urine -unable to move easily Side effects that usually do not require medical attention (report to your doctor or health care professional if they continue or are bothersome): -bone, joint, or muscle pain -constipation -dizzy, drowsy -fever -headache -loss of appetite -nausea, vomiting -pain at site where injected This list may not describe all possible side effects. Call your doctor for medical advice about side effects. You may report side effects to FDA at 1-800-FDA-1088. Where should I keep my medicine? This drug is given in a hospital or clinic and will not be stored at home. NOTE: This sheet is a summary. It may not cover all possible information. If you have questions about this medicine, talk to your doctor, pharmacist, or health care provider.  2018 Elsevier/Gold Standard (2011-04-04 08:49:49)

## 2017-08-07 MED FILL — NINLARO 3 MG CAPSULE: 3 | 28 days supply | Qty: 3 | Fill #0

## 2017-08-20 ENCOUNTER — Ambulatory Visit: Payer: Medicare HMO

## 2017-08-20 ENCOUNTER — Other Ambulatory Visit (HOSPITAL_BASED_OUTPATIENT_CLINIC_OR_DEPARTMENT_OTHER): Payer: Medicare HMO

## 2017-08-20 DIAGNOSIS — C9000 Multiple myeloma not having achieved remission: Secondary | ICD-10-CM | POA: Diagnosis not present

## 2017-08-20 LAB — CBC & DIFF AND RETIC
BASO%: 0.2 % (ref 0.0–2.0)
Basophils Absolute: 0 10*3/uL (ref 0.0–0.1)
EOS%: 1.2 % (ref 0.0–7.0)
Eosinophils Absolute: 0.1 10*3/uL (ref 0.0–0.5)
HCT: 35.8 % — ABNORMAL LOW (ref 38.4–49.9)
HGB: 11.3 g/dL — ABNORMAL LOW (ref 13.0–17.1)
Immature Retic Fract: 10.4 % (ref 3.00–10.60)
LYMPH%: 13 % — AB (ref 14.0–49.0)
MCH: 29.2 pg (ref 27.2–33.4)
MCHC: 31.6 g/dL — AB (ref 32.0–36.0)
MCV: 92.5 fL (ref 79.3–98.0)
MONO#: 0.4 10*3/uL (ref 0.1–0.9)
MONO%: 9.8 % (ref 0.0–14.0)
NEUT%: 75.8 % — ABNORMAL HIGH (ref 39.0–75.0)
NEUTROS ABS: 3.3 10*3/uL (ref 1.5–6.5)
PLATELETS: 97 10*3/uL — AB (ref 140–400)
RBC: 3.87 10*6/uL — AB (ref 4.20–5.82)
RDW: 15 % — ABNORMAL HIGH (ref 11.0–14.6)
Retic %: 0.94 % (ref 0.80–1.80)
Retic Ct Abs: 36.38 10*3/uL (ref 34.80–93.90)
WBC: 4.3 10*3/uL (ref 4.0–10.3)
lymph#: 0.6 10*3/uL — ABNORMAL LOW (ref 0.9–3.3)

## 2017-08-20 LAB — COMPREHENSIVE METABOLIC PANEL
ALT: 19 U/L (ref 0–55)
ANION GAP: 9 meq/L (ref 3–11)
AST: 28 U/L (ref 5–34)
Albumin: 3.8 g/dL (ref 3.5–5.0)
Alkaline Phosphatase: 104 U/L (ref 40–150)
BILIRUBIN TOTAL: 1.09 mg/dL (ref 0.20–1.20)
BUN: 25.7 mg/dL (ref 7.0–26.0)
CO2: 26 meq/L (ref 22–29)
Calcium: 9.2 mg/dL (ref 8.4–10.4)
Chloride: 110 mEq/L — ABNORMAL HIGH (ref 98–109)
Creatinine: 1.5 mg/dL — ABNORMAL HIGH (ref 0.7–1.3)
EGFR: 48 mL/min/{1.73_m2} — AB (ref >60)
GLUCOSE: 112 mg/dL (ref 70–140)
POTASSIUM: 3.8 meq/L (ref 3.5–5.1)
SODIUM: 145 meq/L (ref 136–145)
Total Protein: 6.5 g/dL (ref 6.4–8.3)

## 2017-09-01 MED FILL — NINLARO 3 MG CAPSULE: 3 | 28 days supply | Qty: 3 | Fill #1

## 2017-09-03 ENCOUNTER — Ambulatory Visit: Payer: Medicare HMO | Admitting: Hematology

## 2017-09-03 ENCOUNTER — Ambulatory Visit: Payer: Medicare HMO

## 2017-09-03 ENCOUNTER — Ambulatory Visit (HOSPITAL_BASED_OUTPATIENT_CLINIC_OR_DEPARTMENT_OTHER): Payer: Medicare HMO

## 2017-09-03 ENCOUNTER — Other Ambulatory Visit (HOSPITAL_BASED_OUTPATIENT_CLINIC_OR_DEPARTMENT_OTHER): Payer: Medicare HMO

## 2017-09-03 ENCOUNTER — Telehealth: Payer: Self-pay | Admitting: Hematology

## 2017-09-03 VITALS — BP 121/58 | HR 67 | Temp 98.7°F | Resp 17

## 2017-09-03 DIAGNOSIS — C9000 Multiple myeloma not having achieved remission: Secondary | ICD-10-CM

## 2017-09-03 DIAGNOSIS — Z7189 Other specified counseling: Secondary | ICD-10-CM

## 2017-09-03 LAB — COMPREHENSIVE METABOLIC PANEL
ALK PHOS: 98 U/L (ref 40–150)
ALT: 15 U/L (ref 0–55)
ANION GAP: 8 meq/L (ref 3–11)
AST: 21 U/L (ref 5–34)
Albumin: 3.7 g/dL (ref 3.5–5.0)
BILIRUBIN TOTAL: 1.13 mg/dL (ref 0.20–1.20)
BUN: 24.5 mg/dL (ref 7.0–26.0)
CALCIUM: 9.3 mg/dL (ref 8.4–10.4)
CO2: 26 mEq/L (ref 22–29)
Chloride: 110 mEq/L — ABNORMAL HIGH (ref 98–109)
Creatinine: 1.5 mg/dL — ABNORMAL HIGH (ref 0.7–1.3)
EGFR: 46 mL/min/{1.73_m2} — AB (ref >60)
Glucose: 95 mg/dl (ref 70–140)
POTASSIUM: 3.9 meq/L (ref 3.5–5.1)
Sodium: 143 mEq/L (ref 136–145)
TOTAL PROTEIN: 6.5 g/dL (ref 6.4–8.3)

## 2017-09-03 LAB — CBC & DIFF AND RETIC
BASO%: 0.4 % (ref 0.0–2.0)
Basophils Absolute: 0 10*3/uL (ref 0.0–0.1)
EOS%: 1 % (ref 0.0–7.0)
Eosinophils Absolute: 0.1 10*3/uL (ref 0.0–0.5)
HCT: 35.4 % — ABNORMAL LOW (ref 38.4–49.9)
HGB: 11 g/dL — ABNORMAL LOW (ref 13.0–17.1)
Immature Retic Fract: 6.8 % (ref 3.00–10.60)
LYMPH#: 0.5 10*3/uL — AB (ref 0.9–3.3)
LYMPH%: 10.7 % — AB (ref 14.0–49.0)
MCH: 28.5 pg (ref 27.2–33.4)
MCHC: 31.1 g/dL — AB (ref 32.0–36.0)
MCV: 91.7 fL (ref 79.3–98.0)
MONO#: 0.5 10*3/uL (ref 0.1–0.9)
MONO%: 10.5 % (ref 0.0–14.0)
NEUT%: 77.4 % — ABNORMAL HIGH (ref 39.0–75.0)
NEUTROS ABS: 3.7 10*3/uL (ref 1.5–6.5)
Platelets: 110 10*3/uL — ABNORMAL LOW (ref 140–400)
RBC: 3.86 10*6/uL — AB (ref 4.20–5.82)
RDW: 15.1 % — ABNORMAL HIGH (ref 11.0–14.6)
RETIC CT ABS: 34.74 10*3/uL — AB (ref 34.80–93.90)
Retic %: 0.9 % (ref 0.80–1.80)
WBC: 4.8 10*3/uL (ref 4.0–10.3)

## 2017-09-03 MED ORDER — PROCHLORPERAZINE MALEATE 10 MG PO TABS: 10.0000 mg | ORAL_TABLET | Freq: Once | ORAL | Status: DC

## 2017-09-03 MED ORDER — BORTEZOMIB CHEMO SQ INJECTION 3.5 MG (2.5MG/ML): 1.3000 mg/m2 | Freq: Once | INTRAMUSCULAR | Status: DC

## 2017-09-03 MED ORDER — SODIUM CHLORIDE 0.9 % IV SOLN
60.0000 mg | Freq: Once | INTRAVENOUS | Status: AC
  Administered 2017-09-03: 60 mg via INTRAVENOUS
  Filled 2017-09-03: qty 10

## 2017-09-03 NOTE — Progress Notes (Signed)
[name]    HEMATOLOGY/ONCOLOGY CLINIC NOTE  Date of Service: 09/03/17  Patient Care Team: Sandi Mariscal, MD as PCP - General (Internal Medicine)  CHIEF COMPLAINTS/PURPOSE OF CONSULTATION:  f/u for Multiple Myeloma  HISTORY OF PRESENTING ILLNESS:  plz see previous note for details on HPI  INTERVAL HISTORY  [name] is here for his scheduled followup for myeloma along with his wife for reevaluation.He has been doing well overall. He has began [place], and he reports that he is currently tolerating it well. He does report that his legs have been swelling still, but no acute worsening. He has been ambulating with cane recently, but no recent falls. He does not a small bout of diarrhea 2-3 days ago, but this has since resolved. No other recent acute concerns. He still has some transient nausea, but he has not had any issues with vomiting.   On review of systems, pt denies fever, chills, rash, mouth sores, weight loss, decreased appetite, urinary complaints. Denies pain. Pt denies abdominal pain, vomiting. Notable for that as listed above.   MEDICAL HISTORY:  Past Medical History:  Diagnosis Date  . Arthritis    "left shoulder" (07/31/2016)  . Coronary artery disease   . GERD (gastroesophageal reflux disease)   . Heart murmur   . High cholesterol   . Hypertension   . Multiple myeloma ([place])   . Sleep apnea    "probably; having test in November" (07/31/2016)  . Type II diabetes mellitus ([place])     SURGICAL HISTORY: Past Surgical History:  Procedure Laterality Date  . CARDIAC CATHETERIZATION N/A 07/31/2016   Procedure: Left Heart Cath and Coronary Angiography;  Surgeon: Lorretta Harp, MD;  Location: [place] [facility];  Service: Cardiovascular;  Laterality: N/A;  . CARDIAC CATHETERIZATION N/A 07/31/2016   Procedure: Coronary Stent Intervention;  Surgeon: Lorretta Harp, MD;  Location: [place] [facility];  Service: Cardiovascular;  Laterality: N/A;  . CORONARY ANGIOPLASTY    . SHOULDER  SURGERY Left 1973   "put pin in it where it had separated"   . TONSILLECTOMY  ~ 1956    SOCIAL HISTORY: Social History   Socioeconomic History  . Marital status: Divorced    Spouse name: Not on file  . Number of children: Not on file  . Years of education: Not on file  . Highest education level: Not on file  Social Needs  . Financial resource strain: Not on file  . Food insecurity - worry: Not on file  . Food insecurity - inability: Not on file  . Transportation needs - medical: Not on file  . Transportation needs - non-medical: Not on file  Occupational History  . Not on file  Tobacco Use  . Smoking status: Never Smoker  . Smokeless tobacco: Never Used  Substance and Sexual Activity  . Alcohol use: Yes    Comment: 07/31/2016 "nothing since 2002"  . Drug use: No  . Sexual activity: Not Currently  Other Topics Concern  . Not on file  Social History Narrative  . Not on file    FAMILY HISTORY: Family History  Problem Relation Age of Onset  . Hypertension Other     ALLERGIES:  has No Known Allergies.  MEDICATIONS:  Current Outpatient Medications  Medication Sig Dispense Refill  . acyclovir (ZOVIRAX) 400 MG tablet TAKE 1 TABLET BY MOUTH TWICE DAILY 60 tablet 3  . amLODipine (NORVASC) 5 MG tablet Take 5 mg by mouth daily.    [name] atorvastatin (LIPITOR) 40  MG tablet Take 1 tablet (40 mg total) by mouth at bedtime. 30 tablet 12  . carvedilol (COREG) 12.5 MG tablet Take 12.5 mg by mouth 2 (two) times daily with a meal.    . Cholecalciferol (VITAMIN D3) 5000 units CAPS Take 1 capsule by mouth daily.     . clopidogrel (PLAVIX) 75 MG tablet Take 1 tablet (75 mg total) by mouth daily with breakfast. 30 tablet 12  . Cyanocobalamin (B-12) 1000 MCG SUBL Place 1,000 mcg under the tongue daily. 30 each 3  . escitalopram (LEXAPRO) 5 MG tablet Take 5 mg by mouth daily.    . feeding supplement, ENSURE ENLIVE, (ENSURE ENLIVE) LIQD Take 237 mLs by mouth 2 (two) times daily between meals.  (Patient not taking: Reported on 07/30/2017) 237 mL 12  . furosemide (LASIX) 40 MG tablet     . insulin aspart (NOVOLOG) 100 UNIT/ML injection Correction coverage: Sensitive (thin, NPO, renal)  CBG < 70: implement hypoglycemia protocol  CBG 70 - 120: 0 units  CBG 121 - 150: 1 unit  CBG 151 - 200: 2 units  CBG 201 - 250: 3 units  CBG 251 - 300: 5 units  CBG 301 - 350: 7 units  CBG 351 - 400 9 units  CBG > 400 call MD and obtain STAT lab verification (Patient not taking: Reported on 07/30/2017) 10 mL 11  . ixazomib citrate (NINLARO) 3 MG capsule Take 1 capsule (3 mg total) by mouth once a week. On Day 1,8,15 every 28 days. Take on an empty stomach 1hr before or 2hrs after food. 3 capsule 1  . lactobacillus acidophilus & bulgar (LACTINEX) chewable tablet Chew 1 tablet by mouth 3 (three) times daily with meals. 30 tablet 0  . montelukast (SINGULAIR) 10 MG tablet Take 10 mg by mouth at bedtime.    . nitroGLYCERIN (NITROSTAT) 0.3 MG SL tablet Place 0.3 mg under the tongue as needed for chest pain.    [name] omeprazole (PRILOSEC) 40 MG capsule Take 40 mg by mouth daily.    . ondansetron (ZOFRAN) 8 MG tablet Take 1 tablet (8 mg total) by mouth 2 (two) times daily as needed for refractory nausea / vomiting. Starting on days 4 and 11 not on day 8. 30 tablet 1  . oxyCODONE-acetaminophen (PERCOCET) 10-325 MG tablet Take 1 tablet by mouth 2 (two) times daily.    . polyethylene glycol (MIRALAX / GLYCOLAX) packet Take 17 g by mouth daily.    . potassium chloride SA (K-DUR,KLOR-CON) 20 MEQ tablet Take 1 tablet (20 mEq total) by mouth daily.    . prochlorperazine (COMPAZINE) 10 MG tablet Take 1 tablet (10 mg total) by mouth every 6 (six) hours as needed (Nausea or vomiting). 30 tablet 1  . warfarin (COUMADIN) 6 MG tablet Take 4 mg by mouth every morning.      No current facility-administered medications for this visit.    Facility-Administered Medications Ordered in Other Visits  Medication Dose Route Frequency  Provider Last Rate Last Dose  . 0.9 %  sodium chloride infusion   Intravenous Continuous Brunetta Genera, MD 10 mL/hr at 07/09/17 1423    . pamidronate (AREDIA) 60 mg in sodium chloride 0.9 % 500 mL IVPB  60 mg Intravenous Once Brunetta Genera, MD        REVIEW OF SYSTEMS:    10 Point review of Systems was done is negative except as noted above.  PHYSICAL EXAMINATION:  ECOG PERFORMANCE STATUS: 2  VS stable GENERAL:  NAD SKIN: no acute rsahes OROPHARYNX: MMM  NECK: supple,+ JVD, LYMPH: no palpable lymphadenopathy in the cervical, axillary or inguinal LUNGS: CTA b/l HEART: S1-S2 irreg ABDOMEN: abdomen soft, non-tender, normoactive bowel sounds, no hepatosplenomegaly palpable PSYCH: Alert and oriented 3  NEURO: non focal. LE : b/l trace pedal edema   LABORATORY DATA:  I have reviewed the data as listed . CBC Latest Ref Rng & Units 09/03/2017 08/20/2017 07/30/2017  WBC 4.0 - 10.3 10e3/uL 4.8 4.3 4.5  Hemoglobin 13.0 - 17.1 g/dL 11.0(L) 11.3(L) 11.5(L)  Hematocrit 38.4 - 49.9 % 35.4(L) 35.8(L) 36.0(L)  Platelets 140 - 400 10e3/uL 110(L) 97(L) 121(L)   . CBC    Component Value Date/Time   WBC 4.8 09/03/2017 1235   WBC 3.4 (L) 10/08/2016 0234   RBC 3.86 (L) 09/03/2017 1235   RBC 3.05 (L) 10/08/2016 0234   HGB 11.0 (L) 09/03/2017 1235   HCT 35.4 (L) 09/03/2017 1235   PLT 110 (L) 09/03/2017 1235   MCV 91.7 09/03/2017 1235   MCH 28.5 09/03/2017 1235   MCH 30.2 10/08/2016 0234   MCHC 31.1 (L) 09/03/2017 1235   MCHC 30.4 10/08/2016 0234   RDW 15.1 (H) 09/03/2017 1235   LYMPHSABS 0.5 (L) 09/03/2017 1235   MONOABS 0.5 09/03/2017 1235   EOSABS 0.1 09/03/2017 1235   BASOSABS 0.0 09/03/2017 1235    . CMP Latest Ref Rng & Units 09/03/2017 08/20/2017 07/30/2017  Glucose 70 - 140 mg/dl 95 112 99  BUN 7.0 - 26.0 mg/dL 24.5 25.7 27.1(H)  Creatinine 0.7 - 1.3 mg/dL 1.5(H) 1.5(H) 1.5(H)  Sodium 136 - 145 mEq/L 143 145 146(H)  Potassium 3.5 - 5.1 mEq/L 3.9 3.8 4.0    Chloride 101 - 111 mmol/L - - -  CO2 22 - 29 mEq/L _0 Calcium 8.4 - 10.4 mg/dL 9.3 9.2 9.2  Total Protein 6.4 - 8.3 g/dL 6.5 6.5 6.5  Total Bilirubin 0.20 - 1.20 mg/dL 1.13 1.09 0.91  Alkaline Phos 40 - 150 U/L 98 104 120  AST 5 - 34 U/L _1 ALT 0 - 55 U/L _2 RADIOGRAPHIC STUDIES: I have personally reviewed the radiological images as listed and agreed with the findings in the report. No results found.  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  K/L 95, No M spike on SPEP -- suggests light chain MM Cytogenetics - normal male chromosomes FISH- +11 and 13q-/-13  Patient is s/p 1 cycle of Vd and Serum free kappa LC has decreased from 700 to 203.6 with improvement in his K/L ratio from 94.59 to 29. Completed 2nd cycle of treatment with Vd + Cytoxan (213m/m2) Completed 3rd cycle of treatment with Vd + Cytoxan (2042mm2) - cytoxan held D15 due to thrombocytopenia Completed 4th of VCd Discontinued VCd after C5D8 due to intolerance with diarrhea and increasing neuropathy and fatigue with borderline functional status. -Patient did not tolerate maintenance Revlimid 10 mg by mouth daily due to grade 2-3 diarrhea. This was discontinued  and his diarrhea has resolved. Plan -tolerating maintenance  Ninalro 79m D1/8/15 [id number] finished C1D15 today. -He has tolerated Ninalro well thus far, no prohibitive toxicities. -Continue  B12 replacement -Continue acyclovir for shingles prophylaxis. -Continue aspirin -continue Pamidronate q4weeks -graduated from outpatient cancer rehabilitation              2) CAD s/p prox LAD DES PCI on 07/31/2016 with ischemic cardiomyopathy ejection fraction of 25-35%. Rpt ECHO on 10/03/2016 shows improvement in EF to 55-60% with no RWMABN.  3) Macrocytic Anemia with moderate thrombocytopenia - due to MM and B12 def hgb stable around 11  5) Thrombocytopenia - due to MM and treatment --mild. --will monitor   6) B12 deficiency - Received [place] B12 daily while in the hospital. B12 levels today 529 Plan -cont SL B12 10064m po daily  6) b/l Lower extremity swelling likely due to CHF an CKD. Much improved. Plan -continued adjustment and monitoring of diuretic therapy as per PCP and cardiology  .7) CAD s/p recent DES 8) h/o Afib with RVR on coumadin -continue mx per PCP and cardiology  9) Decubitus Ulcers on buttocks -healed   10) diarrhea - resolved. Stool studies for GI pathogen panel and C. Difficile neg S/p empiric treatment for c diff with po vancomycin Plan Resolved off Revlimid.  11) Elevated Alkaline phosphatase, AST, ALT. No abdominal pain no fevers. Previous CT abd in 09/2016 has shown signs of liver cirrhosis, choledocholithiasis.  ?hepatic congestion from CHF Plan -resolved on rpt labs today -monitor  RTC with Dr KaIrene Limboith labs in 4 weeks Continue Aredia q4weeks -schedule next 2 cycles  All of the patients questions were answered with apparent satisfaction. The patient knows to call the clinic with any problems, questions or concerns.  I spent 20 minutes counseling the patient face to face. The total time spent in the appointment was 25 minutes and more than 50% was on counseling and direct patient cares.    GaSullivan LoneD MSDumontAHIVMS SCNortheast Nebraska Surgery Center LLCTNemaha Valley Community Hospitalematology/Oncology Physician CoKernville(Office):       [phone] cell):   [phone]):           [phone] document serves as a record of services personally performed by GaSullivan LoneMD. It was created on his behalf by WiReola Moshera trained medical scribe. The creation of this record is based on the scribe's personal observations and the provider's statements to them.   .I have reviewed the above documentation for accuracy and completeness, and I agree with the above. .GBrunetta GeneraD MS

## 2017-09-03 NOTE — Telephone Encounter (Signed)
Gave patient avs with appts per 11/15 los - sending message to MD to override a patient to have [place] see him in infusion.

## 2017-09-03 NOTE — Patient Instructions (Signed)
Pamidronate injection What is this medicine? PAMIDRONATE (pa mi DROE nate) slows calcium loss from bones. It is used to treat high calcium blood levels from cancer or Paget's disease. It is also used to treat bone pain and prevent fractures from certain cancers that have spread to the bone. This medicine may be used for other purposes; ask your health care provider or pharmacist if you have questions. COMMON BRAND NAME(S): Aredia What should I tell my health care provider before I take this medicine? They need to know if you have any of these conditions: -aspirin-sensitive asthma -dental disease -kidney disease -an unusual or allergic reaction to pamidronate, other medicines, foods, dyes, or preservatives -pregnant or trying to get pregnant -breast-feeding How should I use this medicine? This medicine is for infusion into a vein. It is given by a health care professional in a hospital or clinic setting. Talk to your pediatrician regarding the use of this medicine in children. This medicine is not approved for use in children. Overdosage: If you think you have taken too much of this medicine contact a poison control center or emergency room at once. NOTE: This medicine is only for you. Do not share this medicine with others. What if I miss a dose? This does not apply. What may interact with this medicine? -certain antibiotics given by injection -medicines for inflammation or pain like ibuprofen, naproxen -some diuretics like bumetanide, furosemide -cyclosporine -parathyroid hormone -tacrolimus -teriparatide -thalidomide This list may not describe all possible interactions. Give your health care provider a list of all the medicines, herbs, non-prescription drugs, or dietary supplements you use. Also tell them if you smoke, drink alcohol, or use illegal drugs. Some items may interact with your medicine. What should I watch for while using this medicine? Visit your doctor or health care  professional for regular checkups. It may be some time before you see the benefit from this medicine. Do not stop taking your medicine unless your doctor tells you to. Your doctor may order blood tests or other tests to see how you are doing. Women should inform their doctor if they wish to become pregnant or think they might be pregnant. There is a potential for serious side effects to an unborn child. Talk to your health care professional or pharmacist for more information. You should make sure that you get enough calcium and vitamin D while you are taking this medicine. Discuss the foods you eat and the vitamins you take with your health care professional. Some people who take this medicine have severe bone, joint, and/or muscle pain. This medicine may also increase your risk for a broken thigh bone. Tell your doctor right away if you have pain in your upper leg or groin. Tell your doctor if you have any pain that does not go away or that gets worse. What side effects may I notice from receiving this medicine? Side effects that you should report to your doctor or health care professional as soon as possible: -allergic reactions like skin rash, itching or hives, swelling of the face, lips, or tongue -black or tarry stools -changes in vision -eye inflammation, pain -high blood pressure -jaw pain, especially burning or cramping -muscle weakness -numb, tingling pain -swelling of feet or hands -trouble passing urine or change in the amount of urine -unable to move easily Side effects that usually do not require medical attention (report to your doctor or health care professional if they continue or are bothersome): -bone, joint, or muscle pain -constipation -dizzy, drowsy -  fever -headache -loss of appetite -nausea, vomiting -pain at site where injected This list may not describe all possible side effects. Call your doctor for medical advice about side effects. You may report side effects to  FDA at 1-800-FDA-1088. Where should I keep my medicine? This drug is given in a hospital or clinic and will not be stored at home. NOTE: This sheet is a summary. It may not cover all possible information. If you have questions about this medicine, talk to your doctor, pharmacist, or health care provider.  2018 Elsevier/Gold Standard (2011-04-04 08:49:49)  

## 2017-09-08 ENCOUNTER — Emergency Department (HOSPITAL_COMMUNITY)
Admission: EM | Admit: 2017-09-08 | Discharge: 2017-09-08 | Disposition: A | Discharge status: Home / Self Care | Payer: Medicare HMO | Attending: Emergency Medicine | Admitting: Emergency Medicine

## 2017-09-08 ENCOUNTER — Encounter (HOSPITAL_COMMUNITY): Payer: Self-pay | Admitting: Internal Medicine

## 2017-09-08 DIAGNOSIS — R040 Epistaxis: Secondary | ICD-10-CM | POA: Diagnosis not present

## 2017-09-08 DIAGNOSIS — Z794 Long term (current) use of insulin: Secondary | ICD-10-CM | POA: Insufficient documentation

## 2017-09-08 DIAGNOSIS — Z79899 Other long term (current) drug therapy: Secondary | ICD-10-CM | POA: Diagnosis not present

## 2017-09-08 DIAGNOSIS — I2510 Atherosclerotic heart disease of native coronary artery without angina pectoris: Secondary | ICD-10-CM | POA: Insufficient documentation

## 2017-09-08 DIAGNOSIS — E119 Type 2 diabetes mellitus without complications: Secondary | ICD-10-CM | POA: Diagnosis not present

## 2017-09-08 DIAGNOSIS — I10 Essential (primary) hypertension: Secondary | ICD-10-CM | POA: Insufficient documentation

## 2017-09-08 LAB — BASIC METABOLIC PANEL
Anion gap: 7 (ref 5–15)
BUN: 33 mg/dL — AB (ref 6–20)
CHLORIDE: 106 mmol/L (ref 101–111)
CO2: 24 mmol/L (ref 22–32)
CREATININE: 1.61 mg/dL — AB (ref 0.61–1.24)
Calcium: 8.7 mg/dL — ABNORMAL LOW (ref 8.9–10.3)
GFR calc Af Amer: 48 mL/min — ABNORMAL LOW (ref >60)
GFR calc non Af Amer: 42 mL/min — ABNORMAL LOW (ref >60)
Glucose, Bld: 112 mg/dL — ABNORMAL HIGH (ref 65–99)
Potassium: 5.7 mmol/L — ABNORMAL HIGH (ref 3.5–5.1)
Sodium: 137 mmol/L (ref 135–145)

## 2017-09-08 LAB — PROTIME-INR
INR: 2.31
Prothrombin Time: 25.2 seconds — ABNORMAL HIGH (ref 11.4–15.2)

## 2017-09-08 LAB — CBC WITH DIFFERENTIAL/PLATELET
BASOS ABS: 0 10*3/uL (ref 0.0–0.1)
Basophils Relative: 1 %
EOS PCT: 1 %
Eosinophils Absolute: 0.1 10*3/uL (ref 0.0–0.7)
HCT: 32.4 % — ABNORMAL LOW (ref 39.0–52.0)
Hemoglobin: 10.4 g/dL — ABNORMAL LOW (ref 13.0–17.0)
LYMPHS ABS: 0.6 10*3/uL — AB (ref 0.7–4.0)
LYMPHS PCT: 8 %
MCH: 29.1 pg (ref 26.0–34.0)
MCHC: 32.1 g/dL (ref 30.0–36.0)
MCV: 90.5 fL (ref 78.0–100.0)
Monocytes Absolute: 0.6 10*3/uL (ref 0.1–1.0)
Monocytes Relative: 9 %
Neutro Abs: 5.3 10*3/uL (ref 1.7–7.7)
Neutrophils Relative %: 81 %
PLATELETS: 187 10*3/uL (ref 150–400)
RBC: 3.58 MIL/uL — ABNORMAL LOW (ref 4.22–5.81)
RDW: 15.6 % — ABNORMAL HIGH (ref 11.5–15.5)
WBC: 6.5 10*3/uL (ref 4.0–10.5)

## 2017-09-08 MED ORDER — SODIUM CHLORIDE 0.9 % IV BOLUS (SEPSIS)
500.0000 mL | Freq: Once | INTRAVENOUS | Status: AC
  Administered 2017-09-08: 500 mL via INTRAVENOUS

## 2017-09-08 MED ORDER — BACITRACIN ZINC 500 UNIT/GM EX OINT
TOPICAL_OINTMENT | CUTANEOUS | Status: AC
  Filled 2017-09-08: qty 4.5

## 2017-09-08 MED ORDER — SODIUM CHLORIDE 0.9 % IV BOLUS (SEPSIS)
500.0000 mL | Freq: Once | INTRAVENOUS | Status: AC
Start: 2017-09-08 — End: 2017-09-08
  Administered 2017-09-08: 500 mL via INTRAVENOUS

## 2017-09-08 MED ORDER — ONDANSETRON HCL 4 MG/2ML IJ SOLN
4.0000 mg | Freq: Once | INTRAMUSCULAR | Status: AC
  Administered 2017-09-08: 4 mg via INTRAVENOUS
  Filled 2017-09-08: qty 2

## 2017-09-08 MED ORDER — LORAZEPAM 2 MG/ML IJ SOLN
0.5000 mg | Freq: Once | INTRAMUSCULAR | Status: AC
  Administered 2017-09-08: 0.5 mg via INTRAVENOUS
  Filled 2017-09-08: qty 1

## 2017-09-08 MED ORDER — PANTOPRAZOLE SODIUM 40 MG IV SOLR
40.0000 mg | Freq: Once | INTRAVENOUS | Status: AC
  Administered 2017-09-08: 40 mg via INTRAVENOUS
  Filled 2017-09-08: qty 40

## 2017-09-08 MED ORDER — OXYMETAZOLINE HCL 0.05 % NA SOLN
1.0000 | Freq: Once | NASAL | Status: AC
Start: 2017-09-08 — End: 2017-09-08
  Administered 2017-09-08: 1 via NASAL
  Filled 2017-09-08: qty 15

## 2017-09-08 MED ORDER — SILVER NITRATE-POT NITRATE 75-25 % EX MISC
1.0000 | Freq: Once | CUTANEOUS | Status: AC
  Administered 2017-09-08: 1 via TOPICAL
  Filled 2017-09-08: qty 1

## 2017-09-08 NOTE — ED Notes (Signed)
ED Provider at bedside. 

## 2017-09-08 NOTE — ED Notes (Addendum)
ED Provider at bedside with ENT cart.

## 2017-09-08 NOTE — ED Notes (Signed)
Bed: WA20 Expected date:  Expected time:  Means of arrival:  Comments: EMS-rectal bleed

## 2017-09-08 NOTE — Discharge Instructions (Signed)
Call [facility] ENT for an appointment Friday morning.  They are open from 8-12 on Friday.  Make sure you mention that you are a follow-up from the emergency department for a nosebleed.   Would avoid dry heat.  Recommend vaporizer for your bedroom.  Can use salt water drops in your nostril.

## 2017-09-08 NOTE — ED Notes (Signed)
Patient complaining that nose is still bleeding. Dr. Lacinda Axon made aware.

## 2017-09-08 NOTE — ED Notes (Signed)
Patient uncontrollably bleeding through nose. EMS at bedside helping to hold pressure.

## 2017-09-08 NOTE — ED Provider Notes (Signed)
[facility] DEPT Provider Note   CSN: [id number] Arrival date & time: 09/08/17  1211     History   Chief Complaint Chief Complaint  Patient presents with  . Epistaxis    HPI Richard Vang is a 71 y.o. male.  Level 5 caveat for urgent need for intervention.  Patient reports a high grade nosebleed on the right side since this morning. He is currently on Plavix and warfarin.  Patient is currently taking chemotherapy for multiple myeloma.      Past Medical History:  Diagnosis Date  . Arthritis    "left shoulder" (07/31/2016)  . Coronary artery disease   . GERD (gastroesophageal reflux disease)   . Heart murmur   . High cholesterol   . Hypertension   . Multiple myeloma ([place])   . Sleep apnea    "probably; having test in November" (07/31/2016)  . Type II diabetes mellitus [facility])     Patient Active Problem List   Diagnosis Date Noted  . Counseling regarding advanced care planning and goals of care 07/30/2017  . Multiple myeloma without remission ([place])   . B12 deficiency   . Lytic bone lesions on xray   . Anemia   . Hypercalcemia 10/02/2016  . Thrombocytopenia ([place]) 10/02/2016  . Low back pain 10/02/2016  . AKI (acute kidney injury) ([place])   . Acute congestive heart failure ([place])   . Bone mass   . CAD S/P percutaneous coronary angioplasty 07/31/2016  . DOE (dyspnea on exertion)   . Essential hypertension 07/18/2016  . Hyperlipidemia 07/18/2016  . Diabetes ([place]) 07/18/2016  . Chronic atrial fibrillation ([place]) 07/18/2016  . Current use of long term anticoagulation 07/18/2016  . Dyspnea on exertion 07/18/2016  . Cardiomyopathy, ischemic: EF ~25 % by LV Gram 07/18/2016  . Abnormal nuclear stress test 07/18/2016    Past Surgical History:  Procedure Laterality Date  . CARDIAC CATHETERIZATION N/A 07/31/2016   Procedure: Left Heart Cath and Coronary Angiography;  Surgeon: Lorretta Harp, MD;  Location: [place];  Service:  Cardiovascular;  Laterality: N/A;  . CARDIAC CATHETERIZATION N/A 07/31/2016   Procedure: Coronary Stent Intervention;  Surgeon: Lorretta Harp, MD;  Location: [facility] [place] [facility];  Service: Cardiovascular;  Laterality: N/A;  . CORONARY ANGIOPLASTY    . SHOULDER SURGERY Left 1973   "put pin in it where it had separated"   . TONSILLECTOMY  ~ 1956       Home Medications    Prior to Admission medications   Medication Sig Start Date End Date Taking? Authorizing Provider  acyclovir (ZOVIRAX) 400 MG tablet TAKE 1 TABLET BY MOUTH TWICE DAILY 06/23/17  Yes Brunetta Genera, MD  amLODipine (NORVASC) 5 MG tablet Take 5 mg by mouth daily.   Yes [provider]  atorvastatin (LIPITOR) 40 MG tablet Take 1 tablet (40 mg total) by mouth at bedtime. 08/01/16  Yes Arbutus Leas, NP  carvedilol (COREG) 12.5 MG tablet Take 12.5 mg by mouth 2 (two) times daily with a meal.   Yes [provider]  Cholecalciferol (VITAMIN D3) 5000 units CAPS Take 1 capsule by mouth daily with breakfast.    Yes [provider]  clopidogrel (PLAVIX) 75 MG tablet Take 1 tablet (75 mg total) by mouth daily with breakfast. 08/02/16  Yes Arbutus Leas, NP  Cyanocobalamin (VITAMIN B-12) 2500 MCG SUBL Place 1 tablet under the tongue daily with breakfast.   Yes [provider]  escitalopram (LEXAPRO)  10 MG tablet Take 10 mg by mouth daily with breakfast. 06/22/17  Yes [provider]  furosemide (LASIX) 40 MG tablet Take 40 mg by mouth daily with breakfast.  08/19/16  Yes [provider]  ixazomib citrate (NINLARO) 3 MG capsule Take 1 capsule (3 mg total) by mouth once a week. On Day 1,8,15 every 28 days. Take on an empty stomach 1hr before or 2hrs after food. Patient taking differently: Take 3 mg by mouth every Tuesday. On Day 1,8,15 every 28 days. Take on an empty stomach 1hr before or 2hrs after food. 08/05/17  Yes Brunetta Genera, MD  montelukast (SINGULAIR) 10 MG tablet  Take 10 mg by mouth daily with breakfast.    Yes [provider]  nitroGLYCERIN (NITROSTAT) 0.3 MG SL tablet Place 0.3 mg under the tongue every 5 (five) minutes as needed for chest pain.    Yes [provider]  omeprazole (PRILOSEC) 40 MG capsule Take 40 mg by mouth daily with breakfast.    Yes [provider]  ondansetron (ZOFRAN) 8 MG tablet Take 1 tablet (8 mg total) by mouth 2 (two) times daily as needed for refractory nausea / vomiting. Starting on days 4 and 11 not on day 8. 04/02/17  Yes Brunetta Genera, MD  oxyCODONE-acetaminophen (PERCOCET) 10-325 MG tablet Take 1 tablet by mouth 2 (two) times daily.   Yes [provider]  polyethylene glycol (MIRALAX / GLYCOLAX) packet Take 17 g by mouth daily as needed for mild constipation.    Yes [provider]  potassium chloride SA (K-DUR,KLOR-CON) 20 MEQ tablet Take 1 tablet (20 mEq total) by mouth daily. Patient taking differently: Take 20 mEq by mouth daily with breakfast.  12/08/16  Yes Brunetta Genera, MD  prochlorperazine (COMPAZINE) 10 MG tablet Take 1 tablet (10 mg total) by mouth every 6 (six) hours as needed (Nausea or vomiting). 02/09/17  Yes Brunetta Genera, MD  warfarin (COUMADIN) 4 MG tablet Take 4 mg by mouth daily.   Yes [provider]  insulin aspart (NOVOLOG) 100 UNIT/ML injection Correction coverage: Sensitive (thin, NPO, renal)  CBG < 70: implement hypoglycemia protocol  CBG 70 - 120: 0 units  CBG 121 - 150: 1 unit  CBG 151 - 200: 2 units  CBG 201 - 250: 3 units  CBG 251 - 300: 5 units  CBG 301 - 350: 7 units  CBG 351 - 400 9 units  CBG > 400 call MD and obtain STAT lab verification Patient not taking: Reported on 07/30/2017 10/08/16   Reyne Dumas, MD    Family History Family History  Problem Relation Age of Onset  . Hypertension Other     Social History Social History   Tobacco Use  . Smoking status: Never Smoker  . Smokeless tobacco: Never  Used  Substance Use Topics  . Alcohol use: Yes    Comment: 07/31/2016 "nothing since 2002"  . Drug use: No     Allergies   Patient has no known allergies.   Review of Systems Review of Systems  Unable to perform ROS: Acuity of condition     Physical Exam Updated Vital Signs BP 116/77   Pulse 83   Temp (!) 97.5 F (36.4 C) (Oral)   Resp 18   SpO2 97%   Physical Exam  Constitutional: He is oriented to person, place, and time. He appears well-developed and well-nourished.  HENT:  Head: Normocephalic and atraumatic.  Obvious aggressive nosebleed from right nostril  Eyes: Conjunctivae are normal.  Neck: Neck supple.  Cardiovascular: Normal rate and regular rhythm.  Pulmonary/Chest: Effort normal and breath sounds normal.  Abdominal: Soft. Bowel sounds are normal.  Musculoskeletal: Normal range of motion.  Neurological: He is alert and oriented to person, place, and time.  Skin: Skin is warm and dry.  Psychiatric: He has a normal mood and affect. His behavior is normal.  Nursing note and vitals reviewed.    ED Treatments / Results  Labs (all labs ordered are listed, but only abnormal results are displayed) Labs Reviewed  CBC WITH DIFFERENTIAL/PLATELET - Abnormal; Notable for the following components:      Result Value   RBC 3.58 (*)    Hemoglobin 10.4 (*)    HCT 32.4 (*)    RDW 15.6 (*)    Lymphs Abs 0.6 (*)    All other components within normal limits  PROTIME-INR - Abnormal; Notable for the following components:   Prothrombin Time 25.2 (*)    All other components within normal limits  BASIC METABOLIC PANEL - Abnormal; Notable for the following components:   Potassium 5.7 (*)    Glucose, Bld 112 (*)    BUN 33 (*)    Creatinine, Ser 1.61 (*)    Calcium 8.7 (*)    GFR calc non Af Amer 42 (*)    GFR calc Af Amer 48 (*)    All other components within normal limits    EKG  EKG Interpretation None       Radiology No results  found.  Procedures .Epistaxis Management Date/Time: 09/08/2017 12:45 PM Performed by: Nat Christen, MD Authorized by: Nat Christen, MD   Consent:    Consent obtained:  Verbal   Consent given by:  Patient   Risks discussed:  Bleeding, nasal injury and pain Procedure details:    Treatment site:  R anterior   Treatment method:  Nasal balloon   Treatment complexity:  Extensive   Treatment episode: initial   Post-procedure details:    Assessment:  Bleeding decreased   Patient tolerance of procedure:  Tolerated with difficulty   (including critical care time)  Medications Ordered in ED Medications  bacitracin 500 UNIT/GM ointment (not administered)  silver nitrate applicators applicator 1 Stick (1 Stick Topical Given 09/08/17 1255)  sodium chloride 0.9 % bolus 500 mL (0 mLs Intravenous Stopped 09/08/17 1411)  ondansetron (ZOFRAN) injection 4 mg (4 mg Intravenous Given 09/08/17 1304)  pantoprazole (PROTONIX) injection 40 mg (40 mg Intravenous Given 09/08/17 1309)  oxymetazoline (AFRIN) 0.05 % nasal spray 1 spray (1 spray Each Nare Given 09/08/17 1309)  sodium chloride 0.9 % bolus 500 mL (0 mLs Intravenous Stopped 09/08/17 1523)  LORazepam (ATIVAN) injection 0.5 mg (0.5 mg Intravenous Given 09/08/17 1411)     Initial Impression / Assessment and Plan / ED Course  I have reviewed the triage vital signs and the nursing notes.  Pertinent labs & imaging results that were available during my care of the patient were reviewed by me and considered in my medical decision making (see chart for details).     Patient presents with obvious epistaxis from right nostril.  I was able to obtain good hemostasis with a Rhino Rocket.  He was observed for 2 hours and hemodynamically stable.  Patient will follow up with ENT.    Final Clinical Impressions(s) / ED Diagnoses   Final diagnoses:  Right-sided epistaxis    ED Discharge Orders    None  Nat Christen, MD 09/08/17 [phone]

## 2017-09-08 NOTE — ED Notes (Signed)
Patient denies pain and is resting comfortably.  

## 2017-09-08 NOTE — ED Triage Notes (Signed)
Per EMS, pt woke up at 6am with nose bleed. Bleed stopped at 7am. Pt took warfarin and plavix after it stopped the first time around 7:30. Nose started bleeding again at around 8:30 and hasn't stopped since.  EMS states that the bleeding has gotten worse since they've been with the patient.   Pt has multiple myeloma. Pt was supposed to start chemotherapy again today (3 weeks on chemo 1 week off chemo rotation).

## 2017-09-17 ENCOUNTER — Ambulatory Visit: Payer: Medicare HMO

## 2017-09-17 ENCOUNTER — Other Ambulatory Visit (HOSPITAL_BASED_OUTPATIENT_CLINIC_OR_DEPARTMENT_OTHER): Payer: Medicare HMO

## 2017-09-17 DIAGNOSIS — C9000 Multiple myeloma not having achieved remission: Secondary | ICD-10-CM | POA: Diagnosis not present

## 2017-09-17 LAB — CBC & DIFF AND RETIC
BASO%: 0.2 % (ref 0.0–2.0)
BASOS ABS: 0 10*3/uL (ref 0.0–0.1)
EOS%: 0.7 % (ref 0.0–7.0)
Eosinophils Absolute: 0 10*3/uL (ref 0.0–0.5)
HCT: 31.9 % — ABNORMAL LOW (ref 38.4–49.9)
HEMOGLOBIN: 10 g/dL — AB (ref 13.0–17.1)
Immature Retic Fract: 12.2 % — ABNORMAL HIGH (ref 3.00–10.60)
LYMPH#: 0.7 10*3/uL — AB (ref 0.9–3.3)
LYMPH%: 12.1 % — AB (ref 14.0–49.0)
MCH: 28.1 pg (ref 27.2–33.4)
MCHC: 31.3 g/dL — ABNORMAL LOW (ref 32.0–36.0)
MCV: 89.6 fL (ref 79.3–98.0)
MONO#: 0.5 10*3/uL (ref 0.1–0.9)
MONO%: 9.4 % (ref 0.0–14.0)
NEUT#: 4.4 10*3/uL (ref 1.5–6.5)
NEUT%: 77.6 % — AB (ref 39.0–75.0)
Platelets: 134 10*3/uL — ABNORMAL LOW (ref 140–400)
RBC: 3.56 10*6/uL — AB (ref 4.20–5.82)
RDW: 15.2 % — ABNORMAL HIGH (ref 11.0–14.6)
Retic %: 1.96 % — ABNORMAL HIGH (ref 0.80–1.80)
Retic Ct Abs: 69.78 10*3/uL (ref 34.80–93.90)
WBC: 5.6 10*3/uL (ref 4.0–10.3)
nRBC: 0 % (ref 0–0)

## 2017-09-17 LAB — COMPREHENSIVE METABOLIC PANEL
ALBUMIN: 3.6 g/dL (ref 3.5–5.0)
ALK PHOS: 118 U/L (ref 40–150)
ALT: 15 U/L (ref 0–55)
AST: 22 U/L (ref 5–34)
Anion Gap: 10 mEq/L (ref 3–11)
BUN: 25.8 mg/dL (ref 7.0–26.0)
CO2: 25 mEq/L (ref 22–29)
Calcium: 9.1 mg/dL (ref 8.4–10.4)
Chloride: 108 mEq/L (ref 98–109)
Creatinine: 1.4 mg/dL — ABNORMAL HIGH (ref 0.7–1.3)
EGFR: 50 mL/min/{1.73_m2} — AB (ref >60)
GLUCOSE: 99 mg/dL (ref 70–140)
Potassium: 3.9 mEq/L (ref 3.5–5.1)
SODIUM: 142 meq/L (ref 136–145)
Total Bilirubin: 0.82 mg/dL (ref 0.20–1.20)
Total Protein: 6.6 g/dL (ref 6.4–8.3)

## 2017-09-24 ENCOUNTER — Ambulatory Visit: Payer: Medicare HMO | Admitting: Hematology

## 2017-09-24 ENCOUNTER — Other Ambulatory Visit: Payer: Self-pay | Admitting: Hematology

## 2017-09-24 ENCOUNTER — Other Ambulatory Visit: Payer: Medicare HMO

## 2017-09-24 DIAGNOSIS — C9000 Multiple myeloma not having achieved remission: Secondary | ICD-10-CM

## 2017-10-01 ENCOUNTER — Telehealth: Payer: Self-pay | Admitting: Hematology

## 2017-10-01 ENCOUNTER — Ambulatory Visit: Payer: Medicare HMO

## 2017-10-01 ENCOUNTER — Ambulatory Visit (HOSPITAL_BASED_OUTPATIENT_CLINIC_OR_DEPARTMENT_OTHER): Payer: Medicare HMO

## 2017-10-01 ENCOUNTER — Encounter: Payer: Self-pay | Admitting: Hematology

## 2017-10-01 ENCOUNTER — Ambulatory Visit: Payer: Medicare HMO | Admitting: Hematology

## 2017-10-01 VITALS — BP 149/81 | HR 68 | Temp 97.8°F | Resp 20

## 2017-10-01 VITALS — BP 90/53 | HR 62 | Temp 98.1°F | Resp 20 | Wt 189.3 lb

## 2017-10-01 DIAGNOSIS — D696 Thrombocytopenia, unspecified: Secondary | ICD-10-CM

## 2017-10-01 DIAGNOSIS — Z7189 Other specified counseling: Secondary | ICD-10-CM

## 2017-10-01 DIAGNOSIS — D649 Anemia, unspecified: Secondary | ICD-10-CM

## 2017-10-01 DIAGNOSIS — C9000 Multiple myeloma not having achieved remission: Secondary | ICD-10-CM

## 2017-10-01 DIAGNOSIS — E538 Deficiency of other specified B group vitamins: Secondary | ICD-10-CM | POA: Diagnosis not present

## 2017-10-01 LAB — CBC & DIFF AND RETIC
BASO%: 0.3 % (ref 0.0–2.0)
BASOS ABS: 0 10*3/uL (ref 0.0–0.1)
EOS ABS: 0.1 10*3/uL (ref 0.0–0.5)
EOS%: 0.7 % (ref 0.0–7.0)
HEMATOCRIT: 29.3 % — AB (ref 38.4–49.9)
HEMOGLOBIN: 9.2 g/dL — AB (ref 13.0–17.1)
Immature Retic Fract: 15.6 % — ABNORMAL HIGH (ref 3.00–10.60)
LYMPH%: 7.6 % — AB (ref 14.0–49.0)
MCH: 27.8 pg (ref 27.2–33.4)
MCHC: 31.4 g/dL — ABNORMAL LOW (ref 32.0–36.0)
MCV: 88.5 fL (ref 79.3–98.0)
MONO#: 0.5 10*3/uL (ref 0.1–0.9)
MONO%: 7 % (ref 0.0–14.0)
NEUT#: 5.8 10*3/uL (ref 1.5–6.5)
NEUT%: 84.4 % — ABNORMAL HIGH (ref 39.0–75.0)
Platelets: 170 10*3/uL (ref 140–400)
RBC: 3.31 10*6/uL — ABNORMAL LOW (ref 4.20–5.82)
RDW: 15.3 % — ABNORMAL HIGH (ref 11.0–14.6)
Retic %: 1.51 % (ref 0.80–1.80)
Retic Ct Abs: 49.98 10*3/uL (ref 34.80–93.90)
WBC: 6.9 10*3/uL (ref 4.0–10.3)
lymph#: 0.5 10*3/uL — ABNORMAL LOW (ref 0.9–3.3)

## 2017-10-01 LAB — TECHNOLOGIST REVIEW

## 2017-10-01 LAB — COMPREHENSIVE METABOLIC PANEL
ALBUMIN: 3.1 g/dL — AB (ref 3.5–5.0)
ALT: 10 U/L (ref 0–55)
AST: 17 U/L (ref 5–34)
Alkaline Phosphatase: 122 U/L (ref 40–150)
Anion Gap: 10 mEq/L (ref 3–11)
BUN: 24.7 mg/dL (ref 7.0–26.0)
CALCIUM: 8.7 mg/dL (ref 8.4–10.4)
CO2: 22 mEq/L (ref 22–29)
CREATININE: 1.3 mg/dL (ref 0.7–1.3)
Chloride: 112 mEq/L — ABNORMAL HIGH (ref 98–109)
EGFR: 54 mL/min/{1.73_m2} — AB (ref >60)
GLUCOSE: 113 mg/dL (ref 70–140)
POTASSIUM: 3.6 meq/L (ref 3.5–5.1)
Sodium: 144 mEq/L (ref 136–145)
Total Bilirubin: 0.8 mg/dL (ref 0.20–1.20)
Total Protein: 6.1 g/dL — ABNORMAL LOW (ref 6.4–8.3)

## 2017-10-01 MED ORDER — SODIUM CHLORIDE 0.9 % IV SOLN
60.0000 mg | Freq: Once | INTRAVENOUS | Status: AC
  Administered 2017-10-01: 60 mg via INTRAVENOUS
  Filled 2017-10-01: qty 10

## 2017-10-01 MED ORDER — SODIUM CHLORIDE 0.9 % IV SOLN
Freq: Once | INTRAVENOUS | Status: AC
  Administered 2017-10-01: 13:00:00 via INTRAVENOUS

## 2017-10-01 MED FILL — NINLARO 3 MG CAPSULE: 3 | 28 days supply | Qty: 3 | Fill #0

## 2017-10-01 NOTE — Telephone Encounter (Signed)
Scheduled appt per 12/13 los - Gave patient AVS and calender per los.  

## 2017-10-01 NOTE — Patient Instructions (Signed)
Pamidronate injection What is this medicine? PAMIDRONATE (pa mi DROE nate) slows calcium loss from bones. It is used to treat high calcium blood levels from cancer or Paget's disease. It is also used to treat bone pain and prevent fractures from certain cancers that have spread to the bone. This medicine may be used for other purposes; ask your health care provider or pharmacist if you have questions. COMMON BRAND NAME(S): Aredia What should I tell my health care provider before I take this medicine? They need to know if you have any of these conditions: -aspirin-sensitive asthma -dental disease -kidney disease -an unusual or allergic reaction to pamidronate, other medicines, foods, dyes, or preservatives -pregnant or trying to get pregnant -breast-feeding How should I use this medicine? This medicine is for infusion into a vein. It is given by a health care professional in a hospital or clinic setting. Talk to your pediatrician regarding the use of this medicine in children. This medicine is not approved for use in children. Overdosage: If you think you have taken too much of this medicine contact a poison control center or emergency room at once. NOTE: This medicine is only for you. Do not share this medicine with others. What if I miss a dose? This does not apply. What may interact with this medicine? -certain antibiotics given by injection -medicines for inflammation or pain like ibuprofen, naproxen -some diuretics like bumetanide, furosemide -cyclosporine -parathyroid hormone -tacrolimus -teriparatide -thalidomide This list may not describe all possible interactions. Give your health care provider a list of all the medicines, herbs, non-prescription drugs, or dietary supplements you use. Also tell them if you smoke, drink alcohol, or use illegal drugs. Some items may interact with your medicine. What should I watch for while using this medicine? Visit your doctor or health care  professional for regular checkups. It may be some time before you see the benefit from this medicine. Do not stop taking your medicine unless your doctor tells you to. Your doctor may order blood tests or other tests to see how you are doing. Women should inform their doctor if they wish to become pregnant or think they might be pregnant. There is a potential for serious side effects to an unborn child. Talk to your health care professional or pharmacist for more information. You should make sure that you get enough calcium and vitamin D while you are taking this medicine. Discuss the foods you eat and the vitamins you take with your health care professional. Some people who take this medicine have severe bone, joint, and/or muscle pain. This medicine may also increase your risk for a broken thigh bone. Tell your doctor right away if you have pain in your upper leg or groin. Tell your doctor if you have any pain that does not go away or that gets worse. What side effects may I notice from receiving this medicine? Side effects that you should report to your doctor or health care professional as soon as possible: -allergic reactions like skin rash, itching or hives, swelling of the face, lips, or tongue -black or tarry stools -changes in vision -eye inflammation, pain -high blood pressure -jaw pain, especially burning or cramping -muscle weakness -numb, tingling pain -swelling of feet or hands -trouble passing urine or change in the amount of urine -unable to move easily Side effects that usually do not require medical attention (report to your doctor or health care professional if they continue or are bothersome): -bone, joint, or muscle pain -constipation -dizzy, drowsy -  fever -headache -loss of appetite -nausea, vomiting -pain at site where injected This list may not describe all possible side effects. Call your doctor for medical advice about side effects. You may report side effects to  FDA at 1-800-FDA-1088. Where should I keep my medicine? This drug is given in a hospital or clinic and will not be stored at home. NOTE: This sheet is a summary. It may not cover all possible information. If you have questions about this medicine, talk to your doctor, pharmacist, or health care provider.  2018 Elsevier/Gold Standard (2011-04-04 08:49:49)  

## 2017-10-01 NOTE — Patient Instructions (Signed)
Thank you for choosing [facility] to provide your oncology and hematology care.  To afford each patient quality time with our providers, please arrive 30 minutes before your scheduled appointment time.  If you arrive late for your appointment, you may be asked to reschedule.  We strive to give you quality time with our providers, and arriving late affects you and other patients whose appointments are after yours.   If you are a no show for multiple scheduled visits, you may be dismissed from the clinic at the providers discretion.    Again, thank you for choosing [facility], our hope is that these requests will decrease the amount of time that you wait before being seen by our physicians.  ______________________________________________________________________  Should you have questions after your visit to the [facility], please contact our office at [phone] between the hours of 8:30 and 4:30 p.m.    Voicemails left after 4:30p.m will not be returned until the following business day.    For prescription refill requests, please have your pharmacy contact us directly.  Please also try to allow 48 hours for prescription requests.    Please contact the scheduling department for questions regarding scheduling.  For scheduling of procedures such as PET scans, CT scans, MRI, Ultrasound, etc please contact central scheduling at [phone].    Resources For Cancer Patients and Caregivers:   Oncolink.org:  A wonderful resource for patients and healthcare providers for information regarding your disease, ways to tract your treatment, what to expect, etc.     American Cancer Society:  800-227-2345  Can help patients locate various types of support and financial assistance  Cancer Care: 1-800-813-HOPE (4673) Provides financial assistance, online support groups, medication/co-pay assistance.    [place] DSS:  336-641-3447 Where to apply for food  stamps, Medicaid, and utility assistance  Medicare Rights Center: 800-333-4114 Helps people with Medicare understand their rights and benefits, navigate the Medicare system, and secure the quality healthcare they deserve  SCAT: 336-333-6589 [place] [employer]'s shared-ride transportation service for eligible riders who have a disability that prevents them from riding the fixed route bus.    For additional information on assistance programs please contact our social worker:   Grier Hock/Abigail Elmore:  [phone]            

## 2017-10-02 LAB — KAPPA/LAMBDA LIGHT CHAINS
Ig Kappa Free Light Chain: 70.7 mg/L — ABNORMAL HIGH (ref 3.3–19.4)
Ig Lambda Free Light Chain: 13.7 mg/L (ref 5.7–26.3)
Kappa/Lambda FluidC Ratio: 5.16 — ABNORMAL HIGH (ref 0.26–1.65)

## 2017-10-05 LAB — MULTIPLE MYELOMA PANEL, SERUM
ALPHA 1: 0.3 g/dL (ref 0.0–0.4)
Albumin SerPl Elph-Mcnc: 3 g/dL (ref 2.9–4.4)
Albumin/Glob SerPl: 1.1 (ref 0.7–1.7)
Alpha2 Glob SerPl Elph-Mcnc: 0.8 g/dL (ref 0.4–1.0)
B-Globulin SerPl Elph-Mcnc: 0.9 g/dL (ref 0.7–1.3)
GAMMA GLOB SERPL ELPH-MCNC: 0.9 g/dL (ref 0.4–1.8)
GLOBULIN, TOTAL: 2.8 g/dL (ref 2.2–3.9)
IGA/IMMUNOGLOBULIN A, SERUM: 54 mg/dL — AB (ref 61–437)
IgG, Qn, Serum: 962 mg/dL (ref 700–1600)
IgM, Qn, Serum: 34 mg/dL (ref 20–172)
Total Protein: 5.8 g/dL — ABNORMAL LOW (ref 6.0–8.5)

## 2017-10-19 NOTE — Progress Notes (Signed)
Richard Vang    HEMATOLOGY/ONCOLOGY CLINIC NOTE  Date of Service: .10/01/2017  Patient Care Team: Sandi Mariscal, MD as PCP - General (Internal Medicine)  CHIEF COMPLAINTS/PURPOSE OF CONSULTATION:  f/u for Multiple Myeloma  HISTORY OF PRESENTING ILLNESS:  plz see previous note for details on HPI  INTERVAL HISTORY  [name] is here for his scheduled followup for myeloma.He has been doing well overall. He is tolerating his Ninlaro well without any acute new toxicitity. Grade 1 fatigue. No new focal bone pains. No change in baseline grade 1 neuropathy  He has been ambulating with cane recently, but no recent falls.  On review of systems, pt denies fever, chills, rash, mouth sores, weight loss, decreased appetite, urinary complaints. Denies pain. Pt denies abdominal pain, vomiting. Notable for that as listed above.   MEDICAL HISTORY:  Past Medical History:  Diagnosis Date  . Arthritis    "left shoulder" (07/31/2016)  . Coronary artery disease   . GERD (gastroesophageal reflux disease)   . Heart murmur   . High cholesterol   . Hypertension   . Multiple myeloma ([place])   . Sleep apnea    "probably; having test in November" (07/31/2016)  . Type II diabetes mellitus ([place])     SURGICAL HISTORY: Past Surgical History:  Procedure Laterality Date  . CARDIAC CATHETERIZATION N/A 07/31/2016   Procedure: Left Heart Cath and Coronary Angiography;  Surgeon: Lorretta Harp, MD;  Location: [facility];  Service: Cardiovascular;  Laterality: N/A;  . CARDIAC CATHETERIZATION N/A 07/31/2016   Procedure: Coronary Stent Intervention;  Surgeon: Lorretta Harp, MD;  Location: [place];  Service: Cardiovascular;  Laterality: N/A;  . CORONARY ANGIOPLASTY    . SHOULDER SURGERY Left 1973   "put pin in it where it had separated"   . TONSILLECTOMY  ~ 1956    SOCIAL HISTORY: Social History   Socioeconomic History  . Marital status: Divorced    Spouse name: Not on file  . Number of  children: Not on file  . Years of education: Not on file  . Highest education level: Not on file  Social Needs  . Financial resource strain: Not on file  . Food insecurity - worry: Not on file  . Food insecurity - inability: Not on file  . Transportation needs - medical: Not on file  . Transportation needs - non-medical: Not on file  Occupational History  . Not on file  Tobacco Use  . Smoking status: Never Smoker  . Smokeless tobacco: Never Used  Substance and Sexual Activity  . Alcohol use: Yes    Comment: 07/31/2016 "nothing since 2002"  . Drug use: No  . Sexual activity: Not Currently  Other Topics Concern  . Not on file  Social History Narrative  . Not on file    FAMILY HISTORY: Family History  Problem Relation Age of Onset  . Hypertension Other     ALLERGIES:  has No Known Allergies.  MEDICATIONS:  Current Outpatient Medications  Medication Sig Dispense Refill  . acyclovir (ZOVIRAX) 400 MG tablet TAKE 1 TABLET BY MOUTH TWICE DAILY 60 tablet 3  . amLODipine (NORVASC) 5 MG tablet Take 5 mg by mouth daily.    Richard Vang atorvastatin (LIPITOR) 40 MG tablet Take 1 tablet (40 mg total) by mouth at bedtime. 30 tablet 12  . carvedilol (COREG) 12.5 MG tablet Take 12.5 mg by mouth 2 (two) times daily with a meal.    . Cholecalciferol (VITAMIN D3) 5000 units CAPS  Take 1 capsule by mouth daily with breakfast.     . clopidogrel (PLAVIX) 75 MG tablet Take 1 tablet (75 mg total) by mouth daily with breakfast. 30 tablet 12  . Cyanocobalamin (VITAMIN B-12) 2500 MCG SUBL Place 1 tablet under the tongue daily with breakfast.    . escitalopram (LEXAPRO) 10 MG tablet Take 10 mg by mouth daily with breakfast.    . furosemide (LASIX) 40 MG tablet Take 40 mg by mouth daily with breakfast.     . insulin aspart (NOVOLOG) 100 UNIT/ML injection Correction coverage: Sensitive (thin, NPO, renal)  CBG < 70: implement hypoglycemia protocol  CBG 70 - 120: 0 units  CBG 121 - 150: 1 unit  CBG 151 - 200: 2  units  CBG 201 - 250: 3 units  CBG 251 - 300: 5 units  CBG 301 - 350: 7 units  CBG 351 - 400 9 units  CBG > 400 call MD and obtain STAT lab verification 10 mL 11  . montelukast (SINGULAIR) 10 MG tablet Take 10 mg by mouth daily with breakfast.     . NINLARO 3 MG capsule TAKE 1 CAPSULE (3 MG TOTAL) BY MOUTH ONCE A WEEK. ON DAY 1,8,15 EVERY 28 DAYS. TAKE ON AN EMPTY STOMACH 1HR BEFORE OR 2HRS AFTER FOOD. 3 capsule 1  . nitroGLYCERIN (NITROSTAT) 0.3 MG SL tablet Place 0.3 mg under the tongue every 5 (five) minutes as needed for chest pain.     Richard Vang omeprazole (PRILOSEC) 40 MG capsule Take 40 mg by mouth daily with breakfast.     . ondansetron (ZOFRAN) 8 MG tablet Take 1 tablet (8 mg total) by mouth 2 (two) times daily as needed for refractory nausea / vomiting. Starting on days 4 and 11 not on day 8. 30 tablet 1  . oxyCODONE-acetaminophen (PERCOCET) 10-325 MG tablet Take 1 tablet by mouth 2 (two) times daily.    . polyethylene glycol (MIRALAX / GLYCOLAX) packet Take 17 g by mouth daily as needed for mild constipation.     . potassium chloride SA (K-DUR,KLOR-CON) 20 MEQ tablet Take 1 tablet (20 mEq total) by mouth daily. (Patient taking differently: Take 20 mEq by mouth daily with breakfast. )    . prochlorperazine (COMPAZINE) 10 MG tablet Take 1 tablet (10 mg total) by mouth every 6 (six) hours as needed (Nausea or vomiting). 30 tablet 1  . warfarin (COUMADIN) 4 MG tablet Take 4 mg by mouth daily.     No current facility-administered medications for this visit.    Facility-Administered Medications Ordered in Other Visits  Medication Dose Route Frequency Provider Last Rate Last Dose  . 0.9 %  sodium chloride infusion   Intravenous Continuous Brunetta Genera, MD 10 mL/hr at 07/09/17 1423      REVIEW OF SYSTEMS:    10 Point review of Systems was done is negative except as noted above.  PHYSICAL EXAMINATION:  ECOG PERFORMANCE STATUS: 2  VS stable GENERAL: NAD SKIN: no acute  rsahes OROPHARYNX: MMM  NECK: supple,+ JVD, LYMPH: no palpable lymphadenopathy in the cervical, axillary or inguinal LUNGS: CTA b/l HEART: S1-S2 irreg ABDOMEN: abdomen soft, non-tender, normoactive bowel sounds, no hepatosplenomegaly palpable PSYCH: Alert and oriented 3  NEURO: non focal. LE : b/l trace pedal edema   LABORATORY DATA:  I have reviewed the data as listed . CBC Latest Ref Rng & Units 10/01/2017 09/17/2017 09/08/2017  WBC 4.0 - 10.3 10e3/uL 6.9 5.6 6.5  Hemoglobin 13.0 - 17.1 g/dL 9.2(L) 10.0(L)  10.4(L)  Hematocrit 38.4 - 49.9 % 29.3(L) 31.9(L) 32.4(L)  Platelets 140 - 400 10e3/uL 170 134(L) 187   . CBC    Component Value Date/Time   WBC 6.9 10/01/2017 1112   WBC 6.5 09/08/2017 1245   RBC 3.31 (L) 10/01/2017 1112   RBC 3.58 (L) 09/08/2017 1245   HGB 9.2 (L) 10/01/2017 1112   HCT 29.3 (L) 10/01/2017 1112   PLT 170 10/01/2017 1112   MCV 88.5 10/01/2017 1112   MCH 27.8 10/01/2017 1112   MCH 29.1 09/08/2017 1245   MCHC 31.4 (L) 10/01/2017 1112   MCHC 32.1 09/08/2017 1245   RDW 15.3 (H) 10/01/2017 1112   LYMPHSABS 0.5 (L) 10/01/2017 1112   MONOABS 0.5 10/01/2017 1112   EOSABS 0.1 10/01/2017 1112   BASOSABS 0.0 10/01/2017 1112    . CMP Latest Ref Rng & Units 10/01/2017 10/01/2017 09/17/2017  Glucose 70 - 140 mg/dl 113 - 99  BUN 7.0 - 26.0 mg/dL 24.7 - 25.8  Creatinine 0.7 - 1.3 mg/dL 1.3 - 1.4(H)  Sodium 136 - 145 mEq/L 144 - 142  Potassium 3.5 - 5.1 mEq/L 3.6 - 3.9  Chloride 101 - 111 mmol/L - - -  CO2 22 - 29 mEq/L 22 - 25  Calcium 8.4 - 10.4 mg/dL 8.7 - 9.1  Total Protein 6.0 - 8.5 g/dL 6.1(L) 5.8(L) 6.6  Total Bilirubin 0.20 - 1.20 mg/dL 0.80 - 0.82  Alkaline Phos 40 - 150 U/L 122 - 118  AST 5 - 34 U/L 17 - 22  ALT 0 - 55 U/L 10 - 15       RADIOGRAPHIC STUDIES: I have personally reviewed the radiological images as listed and agreed with the findings in the report. No results found.  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  K/L 95, No M spike on SPEP -- suggests light chain MM Cytogenetics - normal male chromosomes FISH- +11 and 13q-/-13  Patient is s/p 1 cycle of Vd and Serum free kappa LC has decreased from 700 to 203.6 with improvement in his K/L ratio from 94.59 to 29. Completed 2nd cycle of treatment with Vd + Cytoxan (2794m/m2) Completed 3rd cycle of treatment with Vd + Cytoxan (2038mm2) - cytoxan held D15 due to thrombocytopenia Completed 4th of VCd Discontinued VCd after C5D8 due to intolerance with diarrhea and increasing neuropathy and fatigue with borderline functional status. -Patient did not tolerate maintenance Revlimid 10 mg by mouth daily due to grade 2-3 diarrhea. This was discontinued and his diarrhea has resolved.  Currently on maintenance Ninlaro M spike absent  Increasing kappa serum free light chains and K/L ratio hgb slightly lower at 9.2.  Plan -tolerating maintenance  Ninalro 94m66m1/8/15 q28days and finished C2 -He has tolerated Ninlaro well thus far, no prohibitive toxicities.  -Continue B12 replacement -Continue acyclovir for shingles prophylaxis. -Continue aspirin -continue Pamidronate q4weeks -we discussed increased SFLC ratio and some new anemia -we discussed that we shall trend this with closer f/u in 4 weeks and might need to adjust treatments accordingly.  2) CAD s/p prox LAD DES PCI on 07/31/2016 with ischemic cardiomyopathy  ejection fraction of 25-35%. Rpt ECHO on 10/03/2016 shows improvement in EF to 55-60% with no RWMABN.  3) Macrocytic Anemia with moderate thrombocytopenia - due to MM and B12 def hgb lower at 9.2  5) Thrombocytopenia - due to MM and treatment --resolved today 170k. --will monitor  6) B12 deficiency - Received [place] B12 daily while in the hospital. B12 levels today 529 Plan -cont SL B12 107mg po daily  6) b/l Lower extremity swelling likely due to CHF an CKD. Much improved. Plan -continued adjustment and monitoring of diuretic therapy as per PCP and cardiology  .7) CAD s/p recent DES 8) h/o Afib with RVR on coumadin -continue mx per PCP and cardiology  9) Decubitus Ulcers on buttocks -healed  10) s/p diarrhea - resolved. Stool studies for GI pathogen panel and C. Difficile neg S/p empiric treatment for c diff with po vancomycin Resolved off Revlimid.  11) Elevated Alkaline phosphatase, AST, ALT. No abdominal pain no fevers. Previous CT abd in 09/2016 has shown signs of liver cirrhosis, choledocholithiasis.  ?hepatic congestion from CHF Plan -resolved on rpt labs today -monitor  -continue Pamidronate q4 weeks -RTC with Dr KIrene Limbowith labs in 4 weeks  All of the patients questions were answered with apparent satisfaction. The patient knows to call the clinic with any problems, questions or concerns.  I spent 20 minutes counseling the patient face to face. The total time spent in the appointment was 25 minutes and more than 50% was on counseling and direct patient cares.    GSullivan LoneMD MMuddyAAHIVMS SBethlehem Endoscopy Center LLCCShore Ambulatory Surgical Center LLC Dba Jersey Shore Ambulatory Surgery CenterHematology/Oncology Physician CPalo (Office):       [phone](Work cell):  [phone](Fax):           [phone] This document serves as a record of services personally performed by GSullivan Lone MD. It was created on his behalf by WReola Mosher a trained medical scribe. The creation of this record is based on the scribe's  personal observations and the provider's statements to them.   .I have reviewed the above documentation for accuracy and completeness, and I agree with the above. .Brunetta GeneraMD MS

## 2017-10-26 ENCOUNTER — Other Ambulatory Visit: Payer: Self-pay | Admitting: Hematology

## 2017-10-26 DIAGNOSIS — C9000 Multiple myeloma not having achieved remission: Secondary | ICD-10-CM

## 2017-10-27 MED FILL — NINLARO 3 MG CAPSULE: 3 | 28 days supply | Qty: 3 | Fill #1

## 2017-10-29 ENCOUNTER — Inpatient Hospital Stay: Payer: Medicare HMO

## 2017-10-29 ENCOUNTER — Inpatient Hospital Stay: Payer: Medicare HMO | Attending: Hematology

## 2017-10-29 ENCOUNTER — Inpatient Hospital Stay: Payer: Medicare HMO | Admitting: Hematology

## 2017-10-29 VITALS — BP 131/89 | HR 59 | Temp 97.6°F | Resp 20

## 2017-10-29 DIAGNOSIS — D696 Thrombocytopenia, unspecified: Secondary | ICD-10-CM

## 2017-10-29 DIAGNOSIS — E7800 Pure hypercholesterolemia, unspecified: Secondary | ICD-10-CM | POA: Diagnosis not present

## 2017-10-29 DIAGNOSIS — R600 Localized edema: Secondary | ICD-10-CM | POA: Diagnosis not present

## 2017-10-29 DIAGNOSIS — M545 Low back pain: Secondary | ICD-10-CM | POA: Insufficient documentation

## 2017-10-29 DIAGNOSIS — I10 Essential (primary) hypertension: Secondary | ICD-10-CM | POA: Insufficient documentation

## 2017-10-29 DIAGNOSIS — Z7901 Long term (current) use of anticoagulants: Secondary | ICD-10-CM

## 2017-10-29 DIAGNOSIS — R42 Dizziness and giddiness: Secondary | ICD-10-CM

## 2017-10-29 DIAGNOSIS — K7460 Unspecified cirrhosis of liver: Secondary | ICD-10-CM | POA: Diagnosis not present

## 2017-10-29 DIAGNOSIS — K219 Gastro-esophageal reflux disease without esophagitis: Secondary | ICD-10-CM

## 2017-10-29 DIAGNOSIS — I2510 Atherosclerotic heart disease of native coronary artery without angina pectoris: Secondary | ICD-10-CM

## 2017-10-29 DIAGNOSIS — I4891 Unspecified atrial fibrillation: Secondary | ICD-10-CM

## 2017-10-29 DIAGNOSIS — G8929 Other chronic pain: Secondary | ICD-10-CM

## 2017-10-29 DIAGNOSIS — C9000 Multiple myeloma not having achieved remission: Secondary | ICD-10-CM

## 2017-10-29 DIAGNOSIS — E538 Deficiency of other specified B group vitamins: Secondary | ICD-10-CM

## 2017-10-29 DIAGNOSIS — Z794 Long term (current) use of insulin: Secondary | ICD-10-CM | POA: Insufficient documentation

## 2017-10-29 DIAGNOSIS — Z7984 Long term (current) use of oral hypoglycemic drugs: Secondary | ICD-10-CM

## 2017-10-29 DIAGNOSIS — D6959 Other secondary thrombocytopenia: Secondary | ICD-10-CM

## 2017-10-29 DIAGNOSIS — I255 Ischemic cardiomyopathy: Secondary | ICD-10-CM | POA: Diagnosis not present

## 2017-10-29 DIAGNOSIS — G4730 Sleep apnea, unspecified: Secondary | ICD-10-CM | POA: Insufficient documentation

## 2017-10-29 DIAGNOSIS — Z79899 Other long term (current) drug therapy: Secondary | ICD-10-CM

## 2017-10-29 DIAGNOSIS — R748 Abnormal levels of other serum enzymes: Secondary | ICD-10-CM

## 2017-10-29 DIAGNOSIS — E119 Type 2 diabetes mellitus without complications: Secondary | ICD-10-CM

## 2017-10-29 DIAGNOSIS — D649 Anemia, unspecified: Secondary | ICD-10-CM

## 2017-10-29 DIAGNOSIS — Z7189 Other specified counseling: Secondary | ICD-10-CM

## 2017-10-29 LAB — COMPREHENSIVE METABOLIC PANEL
ALBUMIN: 3.6 g/dL (ref 3.5–5.0)
ALT: 20 U/L (ref 0–55)
ANION GAP: 8 (ref 3–11)
AST: 23 U/L (ref 5–34)
Alkaline Phosphatase: 134 U/L (ref 40–150)
BILIRUBIN TOTAL: 1.1 mg/dL (ref 0.2–1.2)
BUN: 29 mg/dL — ABNORMAL HIGH (ref 7–26)
CO2: 25 mmol/L (ref 22–29)
Calcium: 9 mg/dL (ref 8.4–10.4)
Chloride: 109 mmol/L (ref 98–109)
Creatinine, Ser: 1.5 mg/dL — ABNORMAL HIGH (ref 0.70–1.30)
GFR calc non Af Amer: 45 mL/min — ABNORMAL LOW (ref >60)
GFR, EST AFRICAN AMERICAN: 53 mL/min — AB (ref >60)
GLUCOSE: 102 mg/dL (ref 70–140)
POTASSIUM: 4.1 mmol/L (ref 3.5–5.1)
SODIUM: 142 mmol/L (ref 136–145)
TOTAL PROTEIN: 6.6 g/dL (ref 6.4–8.3)

## 2017-10-29 LAB — FERRITIN: Ferritin: 61 ng/mL (ref 22–316)

## 2017-10-29 LAB — CBC WITH DIFFERENTIAL (CANCER CENTER ONLY)
BASOS ABS: 0 10*3/uL (ref 0.0–0.1)
BASOS PCT: 1 %
Eosinophils Absolute: 0.1 10*3/uL (ref 0.0–0.5)
Eosinophils Relative: 1 %
HCT: 31 % — ABNORMAL LOW (ref 38.4–49.9)
HEMOGLOBIN: 9.6 g/dL — AB (ref 13.0–17.1)
LYMPHS PCT: 8 %
Lymphs Abs: 0.4 10*3/uL — ABNORMAL LOW (ref 0.9–3.3)
MCH: 26.7 pg — ABNORMAL LOW (ref 27.2–33.4)
MCHC: 31 g/dL — ABNORMAL LOW (ref 32.0–36.0)
MCV: 86.2 fL (ref 79.3–98.0)
MONO ABS: 0.4 10*3/uL (ref 0.1–0.9)
Monocytes Relative: 9 %
NEUTROS PCT: 81 %
Neutro Abs: 3.9 10*3/uL (ref 1.5–6.5)
Platelet Count: 108 10*3/uL — ABNORMAL LOW (ref 140–400)
RBC: 3.6 MIL/uL — ABNORMAL LOW (ref 4.20–5.82)
RDW: 17.5 % — AB (ref 11.0–15.6)
WBC Count: 4.8 10*3/uL (ref 4.0–10.3)

## 2017-10-29 LAB — VITAMIN B12: Vitamin B-12: 1865 pg/mL — ABNORMAL HIGH (ref 180–914)

## 2017-10-29 MED ORDER — SODIUM CHLORIDE 0.9 % IV SOLN
Freq: Once | INTRAVENOUS | Status: AC
  Administered 2017-10-29: 15:00:00 via INTRAVENOUS

## 2017-10-29 MED ORDER — SODIUM CHLORIDE 0.9 % IV SOLN
60.0000 mg | Freq: Once | INTRAVENOUS | Status: AC
  Administered 2017-10-29: 60 mg via INTRAVENOUS
  Filled 2017-10-29: qty 10

## 2017-10-29 NOTE — Progress Notes (Signed)
[name]    HEMATOLOGY/ONCOLOGY CLINIC NOTE  Date of Service: 10/29/17  Patient Care Team: Sandi Mariscal, MD as PCP - General (Internal Medicine)  CHIEF COMPLAINTS/PURPOSE OF CONSULTATION:  f/u for Multiple Myeloma  HISTORY OF PRESENTING ILLNESS:  plz see previous note for details on HPI  INTERVAL HISTORY  [name] is here for his scheduled followup for myeloma. He is seen in infusion. He has been feeling dizzy on and off all week. HR in the lower 60's upper 50's with low normal BP -- recommended her discuss possible adjustment of BB/other BP medication doses with his PCP/cardiologist. He also some of his chronic lower back pain . He states that his feet feel swollen, but aren't. He has been taking B12 regularly. Otherwise, he has been doing well and is in good spirits.   On review of systems, pt denies fever, chills, rash, mouth sores, weight loss, decreased appetite, urinary complaints. Pt denies abdominal pain, vomiting. Notable for that as listed above.  MEDICAL HISTORY:  Past Medical History:  Diagnosis Date  . Arthritis    "left shoulder" (07/31/2016)  . Coronary artery disease   . GERD (gastroesophageal reflux disease)   . Heart murmur   . High cholesterol   . Hypertension   . Multiple myeloma (Richard Vang)   . Sleep apnea    "probably; having test in November" (07/31/2016)  . Type II diabetes mellitus ([place])     SURGICAL HISTORY: Past Surgical History:  Procedure Laterality Date  . CARDIAC CATHETERIZATION N/A 07/31/2016   Procedure: Left Heart Cath and Coronary Angiography;  Surgeon: Lorretta Harp, MD;  Location: [facility];  Service: Cardiovascular;  Laterality: N/A;  . CARDIAC CATHETERIZATION N/A 07/31/2016   Procedure: Coronary Stent Intervention;  Surgeon: Lorretta Harp, MD;  Location: [facility];  Service: Cardiovascular;  Laterality: N/A;  . CORONARY ANGIOPLASTY    . SHOULDER SURGERY Left 1973   "put pin in it where it had separated"   . TONSILLECTOMY   ~ 1956    SOCIAL HISTORY: Social History   Socioeconomic History  . Marital status: Divorced    Spouse name: Not on file  . Number of children: Not on file  . Years of education: Not on file  . Highest education level: Not on file  Social Needs  . Financial resource strain: Not on file  . Food insecurity - worry: Not on file  . Food insecurity - inability: Not on file  . Transportation needs - medical: Not on file  . Transportation needs - non-medical: Not on file  Occupational History  . Not on file  Tobacco Use  . Smoking status: Never Smoker  . Smokeless tobacco: Never Used  Substance and Sexual Activity  . Alcohol use: Yes    Comment: 07/31/2016 "nothing since 2002"  . Drug use: No  . Sexual activity: Not Currently  Other Topics Concern  . Not on file  Social History Narrative  . Not on file    FAMILY HISTORY: Family History  Problem Relation Age of Onset  . Hypertension Other     ALLERGIES:  has No Known Allergies.  MEDICATIONS:  Current Outpatient Medications  Medication Sig Dispense Refill  . acyclovir (ZOVIRAX) 400 MG tablet TAKE 1 TABLET BY MOUTH TWICE DAILY 60 tablet 3  . amLODipine (NORVASC) 5 MG tablet Take 5 mg by mouth daily.    [name] atorvastatin (LIPITOR) 40 MG tablet Take 1 tablet (40 mg total) by mouth at bedtime. [place]  tablet 12  . carvedilol (COREG) 12.5 MG tablet Take 12.5 mg by mouth 2 (two) times daily with a meal.    . Cholecalciferol (VITAMIN D3) 5000 units CAPS Take 1 capsule by mouth daily with breakfast.     . clopidogrel (PLAVIX) 75 MG tablet Take 1 tablet (75 mg total) by mouth daily with breakfast. 30 tablet 12  . Cyanocobalamin (VITAMIN B-12) 2500 MCG SUBL Place 1 tablet under the tongue daily with breakfast.    . escitalopram (LEXAPRO) 10 MG tablet Take 10 mg by mouth daily with breakfast.    . furosemide (LASIX) 40 MG tablet Take 40 mg by mouth daily with breakfast.     . insulin aspart (NOVOLOG) 100 UNIT/ML injection Correction  coverage: Sensitive (thin, NPO, renal)  CBG < 70: implement hypoglycemia protocol  CBG 70 - 120: 0 units  CBG 121 - 150: 1 unit  CBG 151 - 200: 2 units  CBG 201 - 250: 3 units  CBG 251 - 300: 5 units  CBG 301 - 350: 7 units  CBG 351 - 400 9 units  CBG > 400 call MD and obtain STAT lab verification 10 mL 11  . montelukast (SINGULAIR) 10 MG tablet Take 10 mg by mouth daily with breakfast.     . NINLARO 3 MG capsule TAKE 1 CAPSULE (3 MG TOTAL) BY MOUTH ONCE A WEEK. ON DAY 1,8,15 EVERY 28 DAYS. TAKE ON AN EMPTY STOMACH 1HR BEFORE OR 2HRS AFTER FOOD. 3 capsule 1  . nitroGLYCERIN (NITROSTAT) 0.3 MG SL tablet Place 0.3 mg under the tongue every 5 (five) minutes as needed for chest pain.     [name] omeprazole (PRILOSEC) 40 MG capsule Take 40 mg by mouth daily with breakfast.     . ondansetron (ZOFRAN) 8 MG tablet Take 1 tablet (8 mg total) by mouth 2 (two) times daily as needed for refractory nausea / vomiting. Starting on days 4 and 11 not on day 8. 30 tablet 1  . oxyCODONE-acetaminophen (PERCOCET) 10-325 MG tablet Take 1 tablet by mouth 2 (two) times daily.    . polyethylene glycol (MIRALAX / GLYCOLAX) packet Take 17 g by mouth daily as needed for mild constipation.     . potassium chloride SA (K-DUR,KLOR-CON) 20 MEQ tablet Take 1 tablet (20 mEq total) by mouth daily. (Patient taking differently: Take 20 mEq by mouth daily with breakfast. )    . prochlorperazine (COMPAZINE) 10 MG tablet Take 1 tablet (10 mg total) by mouth every 6 (six) hours as needed (Nausea or vomiting). 30 tablet 1  . warfarin (COUMADIN) 4 MG tablet Take 4 mg by mouth daily.     No current facility-administered medications for this visit.    Facility-Administered Medications Ordered in Other Visits  Medication Dose Route Frequency Provider Last Rate Last Dose  . 0.9 %  sodium chloride infusion   Intravenous Continuous Brunetta Genera, MD 10 mL/hr at 07/09/17 1423      REVIEW OF SYSTEMS:    A 10+ POINT REVIEW OF SYSTEMS  WAS OBTAINED including neurology, dermatology, psychiatry, cardiac, respiratory, lymph, extremities, GI, GU, Musculoskeletal, constitutional, breasts, reproductive, HEENT.  All pertinent positives are noted in the HPI.  All others are negative.   PHYSICAL EXAMINATION:  ECOG PERFORMANCE STATUS: 2 VS stable GENERAL: NAD SKIN: no acute rsahes OROPHARYNX: MMM  NECK: supple,+ JVD, LYMPH: no palpable lymphadenopathy in the cervical, axillary or inguinal LUNGS: CTA b/l HEART: S1-S2 irreg ABDOMEN: abdomen soft, non-tender, normoactive bowel sounds, no hepatosplenomegaly  palpable PSYCH: Alert and oriented 3  NEURO: non focal. LE : b/l trace pedal edema   LABORATORY DATA:  I have reviewed the data as listed . CBC Latest Ref Rng & Units 10/29/2017 10/01/2017 09/17/2017  WBC 4.0 - 10.3 10e3/uL - 6.9 5.6  Hemoglobin 13.0 - 17.1 g/dL - 9.2(L) 10.0(L)  Hematocrit 38.4 - 49.9 % 31.0(L) 29.3(L) 31.9(L)  Platelets 140 - 400 10e3/uL - 170 134(L)   . CBC    Component Value Date/Time   WBC 6.9 10/01/2017 1112   WBC 6.5 09/08/2017 1245   RBC 3.31 (L) 10/01/2017 1112   RBC 3.58 (L) 09/08/2017 1245   HGB 9.2 (L) 10/01/2017 1112   HCT 29.3 (L) 10/01/2017 1112   PLT 170 10/01/2017 1112   MCV 88.5 10/01/2017 1112   MCH 27.8 10/01/2017 1112   MCH 29.1 09/08/2017 1245   MCHC 31.4 (L) 10/01/2017 1112   MCHC 32.1 09/08/2017 1245   RDW 15.3 (H) 10/01/2017 1112   LYMPHSABS 0.5 (L) 10/01/2017 1112   MONOABS 0.5 10/01/2017 1112   EOSABS 0.1 10/01/2017 1112   BASOSABS 0.0 10/01/2017 1112    . CMP Latest Ref Rng & Units 10/01/2017 10/01/2017 09/17/2017  Glucose 70 - 140 mg/dl 113 - 99  BUN 7.0 - 26.0 mg/dL 24.7 - 25.8  Creatinine 0.7 - 1.3 mg/dL 1.3 - 1.4(H)  Sodium 136 - 145 mEq/L 144 - 142  Potassium 3.5 - 5.1 mEq/L 3.6 - 3.9  Chloride 101 - 111 mmol/L - - -  CO2 22 - 29 mEq/L 22 - 25  Calcium 8.4 - 10.4 mg/dL 8.7 - 9.1  Total Protein 6.0 - 8.5 g/dL 6.1(L) 5.8(L) 6.6  Total Bilirubin  0.20 - 1.20 mg/dL 0.80 - 0.82  Alkaline Phos 40 - 150 U/L 122 - 118  AST 5 - 34 U/L 17 - 22  ALT 0 - 55 U/L 10 - 15   Component     Latest Ref Rng & Units 10/01/2017 10/29/2017  WBC Count     4.0 - 10.3 K/uL  4.8  RBC     4.20 - 5.82 MIL/uL  3.60 (L)  Hemoglobin     13.0 - 17.1 g/dL  9.6 (L)  HCT     38.4 - 49.9 %  31.0 (L)  MCV     79.3 - 98.0 fL  86.2  MCH     27.2 - 33.4 pg  26.7 (L)  MCHC     32.0 - 36.0 g/dL  31.0 (L)  RDW     11.0 - 15.6 %  17.5 (H)  Platelet Count     140 - 400 K/uL  108 (L)  Neutrophils     %  81  NEUT#     1.5 - 6.5 K/uL  3.9  Lymphocytes     %  8  Lymphocyte #     0.9 - 3.3 K/uL  0.4 (L)  Monocytes Relative     %  9  Monocyte #     0.1 - 0.9 K/uL  0.4  Eosinophil     %  1  Eosinophils Absolute     0.0 - 0.5 K/uL  0.1  Basophil     %  1  Basophils Absolute     0.0 - 0.1 K/uL  0.0  Sodium     136 - 145 mmol/L  142  Potassium     3.5 - 5.1 mmol/L  4.1  Chloride     98 -  109 mmol/L  109  CO2     22 - 29 mmol/L  25  Glucose     70 - 140 mg/dL  102  BUN     7 - 26 mg/dL  29 (H)  Creatinine     0.70 - 1.30 mg/dL  1.50 (H)  Calcium     8.4 - 10.4 mg/dL  9.0  Total Protein     6.4 - 8.3 g/dL 5.8 (L) 6.6  Albumin     3.5 - 5.0 g/dL  3.6  AST     5 - 34 U/L  23  ALT     0 - 55 U/L  20  Alkaline Phosphatase     40 - 150 U/L  134  Total Bilirubin     0.2 - 1.2 mg/dL  1.1  GFR, Est Non African American     >60 mL/min  45 (L)  GFR, Est African American     >60 mL/min  53 (L)  Anion gap     3 - 11  8  IgG (Immunoglobin G), Serum     700 - 1,600 mg/dL 962   IgA/Immunoglobulin A, Serum     61 - 437 mg/dL 54 (L)   IgM, Qn, Serum     20 - 172 mg/dL 34   Albumin SerPl Elph-Mcnc     2.9 - 4.4 g/dL 3.0   Alpha 1     0.0 - 0.4 g/dL 0.3   Alpha2 Glob SerPl Elph-Mcnc     0.4 - 1.0 g/dL 0.8   B-Globulin SerPl Elph-Mcnc     0.7 - 1.3 g/dL 0.9   Gamma Glob SerPl Elph-Mcnc     0.4 - 1.8 g/dL 0.9   M Protein SerPl Elph-Mcnc      Not Observed g/dL Not Observed   Globulin, Total     2.2 - 3.9 g/dL 2.8   Albumin/Glob SerPl     0.7 - 1.7 1.1   IFE 1      Comment   Please Note (HCV):      Comment   Ig Kappa Free Light Chain     3.3 - 19.4 mg/L 70.7 (H)   Ig Lambda Free Light Chain     5.7 - 26.3 mg/L 13.7   Kappa/Lambda FluidC Ratio     0.26 - 1.65 5.16 (H)   Kappa free light chain     3.3 - 19.4 mg/L  77.2 (H)  Lamda free light chains     5.7 - 26.3 mg/L  13.8  Kappa, lamda light chain ratio     0.26 - 1.65  5.59 (H)  Vitamin B12     180 - 914 pg/mL  1,865 (H)  Ferritin     22 - 316 ng/mL  61       RADIOGRAPHIC STUDIES: I have personally reviewed the radiological images as listed and agreed with the findings in the report. No results found.  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  K/L 95, No  M spike on SPEP -- suggests light chain MM Cytogenetics - normal male chromosomes FISH- +11 and 13q-/-13  Patient is s/p 1 cycle of Vd and Serum free kappa LC has decreased from 700 to 203.6 with improvement in his K/L ratio from 94.59 to 29. Completed 2nd cycle of treatment with Vd + Cytoxan (261m/m2) Completed 3rd cycle of treatment with Vd + Cytoxan (2046mm2) - cytoxan held D15 due to thrombocytopenia Completed 4th of VCd Discontinued VCd after C5D8 due to intolerance with diarrhea and increasing neuropathy and fatigue with borderline functional  status. -Patient did not tolerate maintenance Revlimid 10 mg by mouth daily due to grade 2-3 diarrhea. This was discontinued and his diarrhea has resolved.  Currently on maintenance Ninlaro M spike absent  graduallu Increasing kappa serum free light chains and K/L ratio hgb slightly stable at 9.6  Plan -tolerating maintenance  Ninalro 72m27m1/8/15 q28days and finished C3 -He has tolerated Ninlaro well thus far, no prohibitive toxicities.  -Continue B12 replacement -Continue acyclovir for shingles prophylaxis. -Continue aspirin -continue Pamidronate q4weeks -we discussed increased SFLC ratio and some new currently anemia and might have to switch to next line treatment if evidence of definitive progressive disease.             2) CAD s/p prox LAD DES PCI on 07/31/2016 with ischemic cardiomyopathy ejection fraction of 25-35%. Rpt ECHO on 10/03/2016 shows improvement in EF to 55-60% with no RWMABN.  3) Macrocytic Anemia with moderate thrombocytopenia - due to MM and and Ninlaro. B12  Levels 1865 with adequate replacement. hgb  at 9.6  5) Thrombocytopenia - due to MM and treatment --resolved today 108k. --will monitor  6) B12 deficiency - Received Margaret B12 daily while in the hospital. B12 levels today 1865 Plan -cont SL B12 1000m50mo daily  6) b/l Lower extremity swelling likely due to CHF an CKD. Much improved. Plan -continued adjustment and monitoring of diuretic therapy as per PCP and cardiology  .7) CAD s/p recent DES 8) h/o Afib with RVR on coumadin Some bradycardia with BB -continue mx per PCP and cardiology  9) Decubitus Ulcers on buttocks -healed  10) s/p diarrhea - resolved. Stool studies for GI pathogen panel and C. Difficile neg S/p empiric treatment for c diff with po vancomycin Resolved off Revlimid.  11) Elevated Alkaline phosphatase, AST, ALT. No abdominal pain no fevers. Previous CT abd in 09/2016 has shown signs of liver cirrhosis, choledocholithiasis.   ?hepatic congestion from CHF Plan -continue Ninlaro as per orders -continue Aredia w4 weeks -RTC with Dr KaleIrene Limbo4 weeks with labs  Orders Placed This Encounter  Procedures  . CBC & Diff and Retic    Standing Status:   Future    Standing Expiration Date:   10/29/2018  . CMP ([facility])    Standing Status:   Future    Standing Expiration Date:   10/29/2018  . Kappa/lambda light chains    Standing Status:   Future    Standing Expiration Date:   12/03/2018     All of the patients questions were answered with apparent satisfaction. The patient knows to call the clinic with any problems, questions or concerns.  I spent 20 minutes counseling the patient face to face. The total time spent in the appointment was 25 minutes and more than 50% was on counseling and direct patient cares.  GautSullivan LoneMS AHarvardIVMS SCH [facility] [facility]/Oncology Physician [facility]):  [phone] (Work cell):  [phone] (Fax):           [phone]  This document serves as a record of services personally performed by Sullivan Lone, MD. It was created on his behalf by Margit Banda, a trained medical scribe. The creation of this record is based on the scribe's personal observations and the provider's statements to them.   .I have reviewed the above documentation for accuracy and completeness, and I agree with the above. Brunetta Genera MD MS

## 2017-10-29 NOTE — Patient Instructions (Signed)
[facility] Discharge Instructions for Patients Receiving Chemotherapy  Today you received the following chemotherapy agents Aredia.  To help prevent nausea and vomiting after your treatment, we encourage you to take your nausea medication as directed.  If you develop nausea and vomiting that is not controlled by your nausea medication, call the clinic.   BELOW ARE SYMPTOMS THAT SHOULD BE REPORTED IMMEDIATELY:  *FEVER GREATER THAN 100.5 F  *CHILLS WITH OR WITHOUT FEVER  NAUSEA AND VOMITING THAT IS NOT CONTROLLED WITH YOUR NAUSEA MEDICATION  *UNUSUAL SHORTNESS OF BREATH  *UNUSUAL BRUISING OR BLEEDING  TENDERNESS IN MOUTH AND THROAT WITH OR WITHOUT PRESENCE OF ULCERS  *URINARY PROBLEMS  *BOWEL PROBLEMS  UNUSUAL RASH Items with * indicate a potential emergency and should be followed up as soon as possible.  Feel free to call the clinic should you have any questions or concerns. The clinic phone number is [phone].  Please show the CHEMO ALERT CARD at check-in to the Emergency Department and triage nurse.   

## 2017-10-30 LAB — KAPPA/LAMBDA LIGHT CHAINS
KAPPA FREE LGHT CHN: 77.2 mg/L — AB (ref 3.3–19.4)
KAPPA, LAMDA LIGHT CHAIN RATIO: 5.59 — AB (ref 0.26–1.65)
Lambda free light chains: 13.8 mg/L (ref 5.7–26.3)

## 2017-11-02 ENCOUNTER — Telehealth: Payer: Self-pay | Admitting: Hematology

## 2017-11-02 LAB — MULTIPLE MYELOMA PANEL, SERUM
ALPHA 1: 0.2 g/dL (ref 0.0–0.4)
ALPHA2 GLOB SERPL ELPH-MCNC: 0.6 g/dL (ref 0.4–1.0)
Albumin SerPl Elph-Mcnc: 3.6 g/dL (ref 2.9–4.4)
Albumin/Glob SerPl: 1.4 (ref 0.7–1.7)
B-Globulin SerPl Elph-Mcnc: 1 g/dL (ref 0.7–1.3)
Gamma Glob SerPl Elph-Mcnc: 0.9 g/dL (ref 0.4–1.8)
Globulin, Total: 2.7 g/dL (ref 2.2–3.9)
IGA: 55 mg/dL — AB (ref 61–437)
IGG (IMMUNOGLOBIN G), SERUM: 1035 mg/dL (ref 700–1600)
IGM (IMMUNOGLOBULIN M), SRM: 30 mg/dL (ref 20–172)
TOTAL PROTEIN ELP: 6.3 g/dL (ref 6.0–8.5)

## 2017-11-02 NOTE — Telephone Encounter (Signed)
Scheduled appt per 1/10 los - patients wife is aware of appt date and time.

## 2017-11-09 ENCOUNTER — Other Ambulatory Visit: Payer: Self-pay | Admitting: Pharmacist

## 2017-11-25 ENCOUNTER — Other Ambulatory Visit: Payer: Self-pay | Admitting: Hematology

## 2017-11-25 DIAGNOSIS — C9000 Multiple myeloma not having achieved remission: Secondary | ICD-10-CM

## 2017-11-25 MED FILL — NINLARO 3 MG CAPSULE: 3 | 28 days supply | Qty: 3 | Fill #0

## 2017-11-25 NOTE — Progress Notes (Signed)
Richard Vang    HEMATOLOGY/ONCOLOGY CLINIC NOTE  Date of Service: 11/26/17  Patient Care Team: Sandi Mariscal, MD as PCP - General (Internal Medicine)  CHIEF COMPLAINTS/PURPOSE OF CONSULTATION:  f/u for Multiple Myeloma  HISTORY OF PRESENTING ILLNESS:  plz see previous note for details on HPI  INTERVAL HISTORY  Richard Vang is here for his scheduled followup for myeloma. The patient's last visit with [name] was on 10/29/17 in infusion. He is accompanied today by his grandson. The pt reports that he is doing well overall.  He reports that he has some lower back pain that has not changed recently. He also notes that he has been feeling well on the Ninlaro. He has had no recent falls. He reports decreased energy. He notes that he is currently taking a diuretic and is a little dehydrated. He notes that his vision has become more blurry and suspects cataracts. He continues to take his B12 supplements.   Lab results today (11/26/17) of CBC, CMP, and Reticulocytes is as follows: all values are WNL except for RBC at 3.60, Hgb at 9.6, Hct at 31.0, MCH at 26.7, MCHC at 31.1, RDW at 17.3, Platelets at 109k, Lymphs Abs at 0.6, BUN at 40, Creatinine at 1.61. Kappa free light chains are gradually increasing. hgb stable at 9.9.  On review of systems, pt reports intermittent back pain, worsened vision, and denies leg swelling, and any other new symptoms.   MEDICAL HISTORY:  Past Medical History:  Diagnosis Date  . Arthritis    "left shoulder" (07/31/2016)  . Coronary artery disease   . GERD (gastroesophageal reflux disease)   . Heart murmur   . High cholesterol   . Hypertension   . Multiple myeloma ([place])   . Sleep apnea    "probably; having test in November" (07/31/2016)  . Type II diabetes mellitus ([place])     SURGICAL HISTORY: Past Surgical History:  Procedure Laterality Date  . CARDIAC CATHETERIZATION N/A 07/31/2016   Procedure: Left Heart Cath and Coronary Angiography;  Surgeon: Lorretta Harp, MD;  Location:  [facility];  Service: Cardiovascular;  Laterality: N/A;  . CARDIAC CATHETERIZATION N/A 07/31/2016   Procedure: Coronary Stent Intervention;  Surgeon: Lorretta Harp, MD;  Location: [facility];  Service: Cardiovascular;  Laterality: N/A;  . CORONARY ANGIOPLASTY    . SHOULDER SURGERY Left 1973   "put pin in it where it had separated"   . TONSILLECTOMY  ~ 1956    SOCIAL HISTORY: Social History   Socioeconomic History  . Marital status: Divorced    Spouse name: Not on file  . Number of children: Not on file  . Years of education: Not on file  . Highest education level: Not on file  Social Needs  . Financial resource strain: Not on file  . Food insecurity - worry: Not on file  . Food insecurity - inability: Not on file  . Transportation needs - medical: Not on file  . Transportation needs - non-medical: Not on file  Occupational History  . Not on file  Tobacco Use  . Smoking status: Never Smoker  . Smokeless tobacco: Never Used  Substance and Sexual Activity  . Alcohol use: Yes    Comment: 07/31/2016 "nothing since 2002"  . Drug use: No  . Sexual activity: Not Currently  Other Topics Concern  . Not on file  Social History Narrative  . Not on file    FAMILY HISTORY: Family History  Problem Relation Age of Onset  .  Hypertension Other     ALLERGIES:  has No Known Allergies.  MEDICATIONS:  Current Outpatient Medications  Medication Sig Dispense Refill  . acyclovir (ZOVIRAX) 400 MG tablet TAKE 1 TABLET BY MOUTH TWICE DAILY 60 tablet 3  . amLODipine (NORVASC) 5 MG tablet Take 5 mg by mouth daily.    Richard Vang atorvastatin (LIPITOR) 40 MG tablet Take 1 tablet (40 mg total) by mouth at bedtime. 30 tablet 12  . carvedilol (COREG) 12.5 MG tablet Take 12.5 mg by mouth 2 (two) times daily with a meal.    . Cholecalciferol (VITAMIN D3) 5000 units CAPS Take 1 capsule by mouth daily with breakfast.     . clopidogrel (PLAVIX) 75 MG tablet Take 1 tablet (75 mg total) by  mouth daily with breakfast. 30 tablet 12  . Cyanocobalamin (VITAMIN B-12) 2500 MCG SUBL Place 1 tablet under the tongue daily with breakfast.    . escitalopram (LEXAPRO) 10 MG tablet Take 10 mg by mouth daily with breakfast.    . furosemide (LASIX) 40 MG tablet Take 40 mg by mouth daily with breakfast.     . insulin aspart (NOVOLOG) 100 UNIT/ML injection Correction coverage: Sensitive (thin, NPO, renal)  CBG < 70: implement hypoglycemia protocol  CBG 70 - 120: 0 units  CBG 121 - 150: 1 unit  CBG 151 - 200: 2 units  CBG 201 - 250: 3 units  CBG 251 - 300: 5 units  CBG 301 - 350: 7 units  CBG 351 - 400 9 units  CBG > 400 call MD and obtain STAT lab verification 10 mL 11  . montelukast (SINGULAIR) 10 MG tablet Take 10 mg by mouth daily with breakfast.     . NINLARO 3 MG capsule TAKE 1 CAPSULE (3 MG TOTAL) BY MOUTH ONCE A WEEK. ON DAY 1,8,15 EVERY 28 DAYS. TAKE ON AN EMPTY STOMACH 1HR BEFORE OR 2HRS AFTER FOOD. 3 capsule 1  . nitroGLYCERIN (NITROSTAT) 0.3 MG SL tablet Place 0.3 mg under the tongue every 5 (five) minutes as needed for chest pain.     Richard Vang omeprazole (PRILOSEC) 40 MG capsule Take 40 mg by mouth daily with breakfast.     . ondansetron (ZOFRAN) 8 MG tablet Take 1 tablet (8 mg total) by mouth 2 (two) times daily as needed for refractory nausea / vomiting. Starting on days 4 and 11 not on day 8. 30 tablet 1  . oxyCODONE-acetaminophen (PERCOCET) 10-325 MG tablet Take 1 tablet by mouth 2 (two) times daily.    . polyethylene glycol (MIRALAX / GLYCOLAX) packet Take 17 g by mouth daily as needed for mild constipation.     . potassium chloride SA (K-DUR,KLOR-CON) 20 MEQ tablet Take 1 tablet (20 mEq total) by mouth daily. (Patient taking differently: Take 20 mEq by mouth daily with breakfast. )    . prochlorperazine (COMPAZINE) 10 MG tablet Take 1 tablet (10 mg total) by mouth every 6 (six) hours as needed (Nausea or vomiting). 30 tablet 1  . warfarin (COUMADIN) 4 MG tablet Take 4 mg by mouth  daily.     No current facility-administered medications for this visit.    Facility-Administered Medications Ordered in Other Visits  Medication Dose Route Frequency Provider Last Rate Last Dose  . 0.9 %  sodium chloride infusion   Intravenous Continuous Richard Genera, MD 10 mL/hr at 07/09/17 1423      REVIEW OF SYSTEMS:    A 10+ POINT REVIEW OF SYSTEMS WAS OBTAINED including neurology, dermatology, psychiatry,  cardiac, respiratory, lymph, extremities, GI, GU, Musculoskeletal, constitutional, breasts, reproductive, HEENT.  All pertinent positives are noted in the HPI.  All others are negative.   PHYSICAL EXAMINATION:  ECOG PERFORMANCE STATUS: 2 VS stable GENERAL: NAD SKIN: no acute rsahes OROPHARYNX: MMM  NECK: supple,+ JVD, LYMPH: no palpable lymphadenopathy in the cervical, axillary or inguinal LUNGS: CTA b/l HEART: S1-S2 irreg ABDOMEN: abdomen soft, non-tender, normoactive bowel sounds, no hepatosplenomegaly palpable PSYCH: Alert and oriented 3  NEURO: non focal. LE : b/l trace pedal edema   LABORATORY DATA:  I have reviewed the data as listed . CBC Latest Ref Rng & Units 11/26/2017 10/29/2017 10/01/2017  WBC 4.0 - 10.3 K/uL 5.8 4.8 6.9  Hemoglobin 13.0 - 17.1 g/dL - - 9.2(L)  Hematocrit 38.4 - 49.9 % 31.8(L) 31.0(L) 29.3(L)  Platelets 140 - 400 K/uL 109(L) 108(L) 170   . CBC    Component Value Date/Time   WBC 5.8 11/26/2017 1101   WBC 6.9 10/01/2017 1112   WBC 6.5 09/08/2017 1245   RBC 3.73 (L) 11/26/2017 1101   RBC 3.73 (L) 11/26/2017 1101   HGB 9.2 (L) 10/01/2017 1112   HCT 31.8 (L) 11/26/2017 1101   HCT 29.3 (L) 10/01/2017 1112   PLT 109 (L) 11/26/2017 1101   PLT 170 10/01/2017 1112   MCV 85.3 11/26/2017 1101   MCV 88.5 10/01/2017 1112   MCH 26.5 (L) 11/26/2017 1101   MCHC 31.1 (L) 11/26/2017 1101   RDW 17.3 (H) 11/26/2017 1101   RDW 15.3 (H) 10/01/2017 1112   LYMPHSABS 0.6 (L) 11/26/2017 1101   LYMPHSABS 0.5 (L) 10/01/2017 1112   MONOABS 0.6  11/26/2017 1101   MONOABS 0.5 10/01/2017 1112   EOSABS 0.1 11/26/2017 1101   EOSABS 0.1 10/01/2017 1112   BASOSABS 0.0 11/26/2017 1101   BASOSABS 0.0 10/01/2017 1112    . CMP Latest Ref Rng & Units 11/26/2017 10/29/2017 10/01/2017  Glucose 70 - 140 mg/dL 89 102 113  BUN 7 - 26 mg/dL 40(H) 29(H) 24.7  Creatinine 0.70 - 1.30 mg/dL 1.61(H) 1.50(H) 1.3  Sodium 136 - 145 mmol/L 141 142 144  Potassium 3.5 - 5.1 mmol/L 4.6 4.1 3.6  Chloride 98 - 109 mmol/L 109 109 -  CO2 22 - 29 mmol/L '25 25 22  ' Calcium 8.4 - 10.4 mg/dL 9.1 9.0 8.7  Total Protein 6.4 - 8.3 g/dL 6.5 6.6 6.1(L)  Total Bilirubin 0.2 - 1.2 mg/dL 0.7 1.1 0.80  Alkaline Phos 40 - 150 U/L 116 134 122  AST 5 - 34 U/L '28 23 17  ' ALT 0 - 55 U/L '24 20 10         ' RADIOGRAPHIC STUDIES: I have personally reviewed the radiological images as listed and agreed with the findings in the report. No results found.  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  K/L 95, No M spike on SPEP -- suggests light chain MM Cytogenetics - normal male chromosomes FISH- +11 and 13q-/-13  Patient is s/p 1 cycle of Vd and Serum free kappa LC  has decreased from 700 to 203.6 with improvement in his K/L ratio from 94.59 to 29. Completed 2nd cycle of treatment with Vd + Cytoxan (242m/m2) Completed 3rd cycle of treatment with Vd + Cytoxan (2019mm2) - cytoxan held D15 due to thrombocytopenia Completed 4th of  VCd Discontinued VCd after C5D8 due to intolerance with diarrhea and increasing neuropathy and fatigue with borderline functional status. -Patient did not tolerate maintenance Revlimid 10 mg by mouth daily due to grade 2-3 diarrhea. This was discontinued and his diarrhea has resolved.  Currently on maintenance Ninlaro M spike absent  gradually Increasing kappa serum free light chains and K/L ratio Hgb sightly stable at 9.9  Plan -tolerating maintenance  Ninalro 60m55m1/8/15 q28days and finished C4 -He has tolerated Ninlaro well thus far, no prohibitive toxicities.  -Continue B12 replacement -Continue acyclovir for shingles prophylaxis. -Continue aspirin -continue Pamidronate q4weeks -we discussed increased SFLC ratio and some new currently anemia and might have to switch to next line treatment if evidence of definitive progressive disease. Will discuss switching to Dara/Dexamethasone. Patient hesitant to change current meds due to concern regarding treatment tolerance             2) CAD s/p prox LAD DES PCI on 07/31/2016 with ischemic cardiomyopathy ejection fraction of 25-35%. Rpt ECHO on 10/03/2016 shows improvement in EF to 55-60% with no RWMABN.  3) Macrocytic Anemia with moderate thrombocytopenia - due to MM and and Ninlaro. B12  Levels 1865 with adequate replacement. Hgb at 9.9.   5) Thrombocytopenia - due to MM and treatment --resolved today 108k. --will monitor  6) B12 deficiency - Received  B12 daily while in the hospital. Plan -cont SL B12 1000m60mo daily  6) b/l Lower extremity swelling likely due to CHF an CKD. Much improved. Plan -continued adjustment and monitoring of diuretic therapy as per PCP and cardiology  .7) CAD s/p recent DES 8) h/o Afib with RVR on coumadin Some bradycardia with BB -continue mx per PCP and cardiology  9) Decubitus Ulcers on buttocks -healed  10) s/p diarrhea - resolved. Stool studies for GI pathogen panel and C. Difficile neg S/p  empiric treatment for c diff with po vancomycin Resolved off Revlimid.  11) Elevated Alkaline phosphatase, AST, ALT. No abdominal pain no fevers. Previous CT abd in 09/2016 has shown signs of liver cirrhosis, choledocholithiasis.  ?hepatic congestion from CHF.   Continue Aredia q4weeks RTC with Dr KaleIrene Limbo4 weeks with labs   All of the patients questions were answered with apparent satisfaction. The patient knows to call the clinic with any problems, questions or concerns.  I spent 20 minutes counseling the patient face to face. The total time spent in the appointment was 25 minutes and more than 50% was on counseling and direct patient cares.  GautSullivan LoneMS ARuthvenIVMS [facility]/Oncology Physician [facility] Inst[facility]):       [phone] cell):  [phone]):           [phone] document serves as a record of services personally performed by GautSullivan Vang. It was created on his behalf by SchuBaldwin Jamaicatrained medical scribe. The creation of this record is based on the scribe's personal observations and the provider's statements to them.   .I have reviewed the above documentation for accuracy and completeness, and I agree with the above. .GauBrunetta Vang

## 2017-11-26 ENCOUNTER — Ambulatory Visit: Payer: Medicare HMO | Admitting: Hematology

## 2017-11-26 ENCOUNTER — Inpatient Hospital Stay: Payer: Medicare HMO | Attending: Hematology

## 2017-11-26 ENCOUNTER — Inpatient Hospital Stay: Payer: Medicare HMO | Admitting: Hematology

## 2017-11-26 ENCOUNTER — Inpatient Hospital Stay: Payer: Medicare HMO

## 2017-11-26 VITALS — BP 110/55 | HR 72 | Temp 98.8°F | Resp 20

## 2017-11-26 DIAGNOSIS — I10 Essential (primary) hypertension: Secondary | ICD-10-CM | POA: Insufficient documentation

## 2017-11-26 DIAGNOSIS — R5383 Other fatigue: Secondary | ICD-10-CM | POA: Insufficient documentation

## 2017-11-26 DIAGNOSIS — E860 Dehydration: Secondary | ICD-10-CM | POA: Insufficient documentation

## 2017-11-26 DIAGNOSIS — E7800 Pure hypercholesterolemia, unspecified: Secondary | ICD-10-CM | POA: Insufficient documentation

## 2017-11-26 DIAGNOSIS — D649 Anemia, unspecified: Secondary | ICD-10-CM

## 2017-11-26 DIAGNOSIS — R7989 Other specified abnormal findings of blood chemistry: Secondary | ICD-10-CM | POA: Diagnosis not present

## 2017-11-26 DIAGNOSIS — D6959 Other secondary thrombocytopenia: Secondary | ICD-10-CM | POA: Insufficient documentation

## 2017-11-26 DIAGNOSIS — E538 Deficiency of other specified B group vitamins: Secondary | ICD-10-CM | POA: Insufficient documentation

## 2017-11-26 DIAGNOSIS — G4730 Sleep apnea, unspecified: Secondary | ICD-10-CM | POA: Insufficient documentation

## 2017-11-26 DIAGNOSIS — R600 Localized edema: Secondary | ICD-10-CM | POA: Insufficient documentation

## 2017-11-26 DIAGNOSIS — M545 Low back pain: Secondary | ICD-10-CM | POA: Insufficient documentation

## 2017-11-26 DIAGNOSIS — Z7901 Long term (current) use of anticoagulants: Secondary | ICD-10-CM

## 2017-11-26 DIAGNOSIS — Z7189 Other specified counseling: Secondary | ICD-10-CM

## 2017-11-26 DIAGNOSIS — C9000 Multiple myeloma not having achieved remission: Secondary | ICD-10-CM

## 2017-11-26 DIAGNOSIS — I2510 Atherosclerotic heart disease of native coronary artery without angina pectoris: Secondary | ICD-10-CM | POA: Diagnosis not present

## 2017-11-26 DIAGNOSIS — E119 Type 2 diabetes mellitus without complications: Secondary | ICD-10-CM

## 2017-11-26 DIAGNOSIS — K219 Gastro-esophageal reflux disease without esophagitis: Secondary | ICD-10-CM | POA: Diagnosis not present

## 2017-11-26 DIAGNOSIS — Z79899 Other long term (current) drug therapy: Secondary | ICD-10-CM

## 2017-11-26 DIAGNOSIS — Z794 Long term (current) use of insulin: Secondary | ICD-10-CM | POA: Diagnosis not present

## 2017-11-26 DIAGNOSIS — K7460 Unspecified cirrhosis of liver: Secondary | ICD-10-CM | POA: Diagnosis not present

## 2017-11-26 LAB — CMP (CANCER CENTER ONLY)
ALBUMIN: 3.6 g/dL (ref 3.5–5.0)
ALT: 24 U/L (ref 0–55)
AST: 28 U/L (ref 5–34)
Alkaline Phosphatase: 116 U/L (ref 40–150)
Anion gap: 7 (ref 3–11)
BILIRUBIN TOTAL: 0.7 mg/dL (ref 0.2–1.2)
BUN: 40 mg/dL — AB (ref 7–26)
CHLORIDE: 109 mmol/L (ref 98–109)
CO2: 25 mmol/L (ref 22–29)
Calcium: 9.1 mg/dL (ref 8.4–10.4)
Creatinine: 1.61 mg/dL — ABNORMAL HIGH (ref 0.70–1.30)
GFR, Est AFR Am: 48 mL/min — ABNORMAL LOW (ref >60)
GFR, Estimated: 42 mL/min — ABNORMAL LOW (ref >60)
GLUCOSE: 89 mg/dL (ref 70–140)
POTASSIUM: 4.6 mmol/L (ref 3.5–5.1)
Sodium: 141 mmol/L (ref 136–145)
Total Protein: 6.5 g/dL (ref 6.4–8.3)

## 2017-11-26 LAB — CBC WITH DIFFERENTIAL (CANCER CENTER ONLY)
Basophils Absolute: 0 10*3/uL (ref 0.0–0.1)
Basophils Relative: 0 %
Eosinophils Absolute: 0.1 10*3/uL (ref 0.0–0.5)
Eosinophils Relative: 2 %
HEMATOCRIT: 31.8 % — AB (ref 38.4–49.9)
Hemoglobin: 9.9 g/dL — ABNORMAL LOW (ref 13.0–17.1)
LYMPHS PCT: 10 %
Lymphs Abs: 0.6 10*3/uL — ABNORMAL LOW (ref 0.9–3.3)
MCH: 26.5 pg — ABNORMAL LOW (ref 27.2–33.4)
MCHC: 31.1 g/dL — AB (ref 32.0–36.0)
MCV: 85.3 fL (ref 79.3–98.0)
MONO ABS: 0.6 10*3/uL (ref 0.1–0.9)
MONOS PCT: 10 %
NEUTROS ABS: 4.5 10*3/uL (ref 1.5–6.5)
Neutrophils Relative %: 78 %
Platelet Count: 109 10*3/uL — ABNORMAL LOW (ref 140–400)
RBC: 3.73 MIL/uL — ABNORMAL LOW (ref 4.20–5.82)
RDW: 17.3 % — AB (ref 11.0–14.6)
WBC Count: 5.8 10*3/uL (ref 4.0–10.3)

## 2017-11-26 LAB — RETICULOCYTES
RBC.: 3.73 MIL/uL — ABNORMAL LOW (ref 4.20–5.82)
RETIC COUNT ABSOLUTE: 33.6 10*3/uL — AB (ref 34.8–93.9)
Retic Ct Pct: 0.9 % (ref 0.8–1.8)

## 2017-11-26 MED ORDER — SODIUM CHLORIDE 0.9 % IV SOLN
60.0000 mg | Freq: Once | INTRAVENOUS | Status: AC
  Administered 2017-11-26: 60 mg via INTRAVENOUS
  Filled 2017-11-26: qty 10

## 2017-11-26 NOTE — Patient Instructions (Addendum)
Pamidronate injection What is this medicine? PAMIDRONATE (pa mi DROE nate) slows calcium loss from bones. It is used to treat high calcium blood levels from cancer or Paget's disease. It is also used to treat bone pain and prevent fractures from certain cancers that have spread to the bone. This medicine may be used for other purposes; ask your health care provider or pharmacist if you have questions. COMMON BRAND NAME(S): Aredia What should I tell my health care provider before I take this medicine? They need to know if you have any of these conditions: -aspirin-sensitive asthma -dental disease -kidney disease -an unusual or allergic reaction to pamidronate, other medicines, foods, dyes, or preservatives -pregnant or trying to get pregnant -breast-feeding How should I use this medicine? This medicine is for infusion into a vein. It is given by a health care professional in a hospital or clinic setting. Talk to your pediatrician regarding the use of this medicine in children. This medicine is not approved for use in children. Overdosage: If you think you have taken too much of this medicine contact a poison control center or emergency room at once. NOTE: This medicine is only for you. Do not share this medicine with others. What if I miss a dose? This does not apply. What may interact with this medicine? -certain antibiotics given by injection -medicines for inflammation or pain like ibuprofen, naproxen -some diuretics like bumetanide, furosemide -cyclosporine -parathyroid hormone -tacrolimus -teriparatide -thalidomide This list may not describe all possible interactions. Give your health care provider a list of all the medicines, herbs, non-prescription drugs, or dietary supplements you use. Also tell them if you smoke, drink alcohol, or use illegal drugs. Some items may interact with your medicine. What should I watch for while using this medicine? Visit your doctor or health care  professional for regular checkups. It may be some time before you see the benefit from this medicine. Do not stop taking your medicine unless your doctor tells you to. Your doctor may order blood tests or other tests to see how you are doing. Women should inform their doctor if they wish to become pregnant or think they might be pregnant. There is a potential for serious side effects to an unborn child. Talk to your health care professional or pharmacist for more information. You should make sure that you get enough calcium and vitamin D while you are taking this medicine. Discuss the foods you eat and the vitamins you take with your health care professional. Some people who take this medicine have severe bone, joint, and/or muscle pain. This medicine may also increase your risk for a broken thigh bone. Tell your doctor right away if you have pain in your upper leg or groin. Tell your doctor if you have any pain that does not go away or that gets worse. What side effects may I notice from receiving this medicine? Side effects that you should report to your doctor or health care professional as soon as possible: -allergic reactions like skin rash, itching or hives, swelling of the face, lips, or tongue -black or tarry stools -changes in vision -eye inflammation, pain -high blood pressure -jaw pain, especially burning or cramping -muscle weakness -numb, tingling pain -swelling of feet or hands -trouble passing urine or change in the amount of urine -unable to move easily Side effects that usually do not require medical attention (report to your doctor or health care professional if they continue or are bothersome): -bone, joint, or muscle pain -constipation -dizzy, drowsy -  fever -headache -loss of appetite -nausea, vomiting -pain at site where injected This list may not describe all possible side effects. Call your doctor for medical advice about side effects. You may report side effects to  FDA at 1-800-FDA-1088. Where should I keep my medicine? This drug is given in a hospital or clinic and will not be stored at home. NOTE: This sheet is a summary. It may not cover all possible information. If you have questions about this medicine, talk to your doctor, pharmacist, or health care provider.  2018 Elsevier/Gold Standard (2011-04-04 08:49:49)  

## 2017-11-27 ENCOUNTER — Telehealth: Payer: Self-pay | Admitting: Hematology

## 2017-11-27 LAB — KAPPA/LAMBDA LIGHT CHAINS
KAPPA FREE LGHT CHN: 81.7 mg/L — AB (ref 3.3–19.4)
KAPPA, LAMDA LIGHT CHAIN RATIO: 6.92 — AB (ref 0.26–1.65)
LAMDA FREE LIGHT CHAINS: 11.8 mg/L (ref 5.7–26.3)

## 2017-11-27 NOTE — Telephone Encounter (Signed)
Waiting to see if MD can see in the tx area

## 2017-12-24 ENCOUNTER — Ambulatory Visit: Payer: Medicare HMO | Admitting: Hematology

## 2017-12-24 ENCOUNTER — Inpatient Hospital Stay: Payer: Medicare HMO | Admitting: Hematology

## 2017-12-24 ENCOUNTER — Inpatient Hospital Stay: Payer: Medicare HMO | Attending: Hematology

## 2017-12-24 ENCOUNTER — Inpatient Hospital Stay: Payer: Medicare HMO

## 2017-12-24 VITALS — BP 118/60 | HR 55 | Temp 98.4°F | Resp 18

## 2017-12-24 DIAGNOSIS — C9000 Multiple myeloma not having achieved remission: Secondary | ICD-10-CM

## 2017-12-24 DIAGNOSIS — Z7189 Other specified counseling: Secondary | ICD-10-CM

## 2017-12-24 DIAGNOSIS — I2510 Atherosclerotic heart disease of native coronary artery without angina pectoris: Secondary | ICD-10-CM

## 2017-12-24 DIAGNOSIS — I255 Ischemic cardiomyopathy: Secondary | ICD-10-CM

## 2017-12-24 DIAGNOSIS — E538 Deficiency of other specified B group vitamins: Secondary | ICD-10-CM | POA: Insufficient documentation

## 2017-12-24 DIAGNOSIS — I4891 Unspecified atrial fibrillation: Secondary | ICD-10-CM | POA: Diagnosis not present

## 2017-12-24 DIAGNOSIS — Z7901 Long term (current) use of anticoagulants: Secondary | ICD-10-CM | POA: Insufficient documentation

## 2017-12-24 DIAGNOSIS — Z7982 Long term (current) use of aspirin: Secondary | ICD-10-CM | POA: Insufficient documentation

## 2017-12-24 DIAGNOSIS — D539 Nutritional anemia, unspecified: Secondary | ICD-10-CM

## 2017-12-24 DIAGNOSIS — M7989 Other specified soft tissue disorders: Secondary | ICD-10-CM | POA: Diagnosis not present

## 2017-12-24 DIAGNOSIS — R001 Bradycardia, unspecified: Secondary | ICD-10-CM | POA: Insufficient documentation

## 2017-12-24 DIAGNOSIS — D6959 Other secondary thrombocytopenia: Secondary | ICD-10-CM | POA: Diagnosis not present

## 2017-12-24 LAB — SEDIMENTATION RATE: SED RATE: 12 mm/h (ref 0–16)

## 2017-12-24 LAB — CBC WITH DIFFERENTIAL/PLATELET
BASOS PCT: 0 %
Basophils Absolute: 0 10*3/uL (ref 0.0–0.1)
EOS ABS: 0.1 10*3/uL (ref 0.0–0.5)
Eosinophils Relative: 3 %
HEMATOCRIT: 31.8 % — AB (ref 38.4–49.9)
HEMOGLOBIN: 9.8 g/dL — AB (ref 13.0–17.1)
Lymphocytes Relative: 13 %
Lymphs Abs: 0.6 10*3/uL — ABNORMAL LOW (ref 0.9–3.3)
MCH: 26.8 pg — ABNORMAL LOW (ref 27.2–33.4)
MCHC: 30.8 g/dL — ABNORMAL LOW (ref 32.0–36.0)
MCV: 86.9 fL (ref 79.3–98.0)
MONOS PCT: 9 %
Monocytes Absolute: 0.4 10*3/uL (ref 0.1–0.9)
NEUTROS ABS: 3.5 10*3/uL (ref 1.5–6.5)
NEUTROS PCT: 75 %
Platelets: 91 10*3/uL — ABNORMAL LOW (ref 140–400)
RBC: 3.66 MIL/uL — ABNORMAL LOW (ref 4.20–5.82)
RDW: 18.6 % — ABNORMAL HIGH (ref 11.0–14.6)
WBC: 4.7 10*3/uL (ref 4.0–10.3)

## 2017-12-24 LAB — CMP (CANCER CENTER ONLY)
ALBUMIN: 3.7 g/dL (ref 3.5–5.0)
ALK PHOS: 103 U/L (ref 40–150)
ALT: 20 U/L (ref 0–55)
ANION GAP: 9 (ref 3–11)
AST: 20 U/L (ref 5–34)
BUN: 32 mg/dL — ABNORMAL HIGH (ref 7–26)
CHLORIDE: 112 mmol/L — AB (ref 98–109)
CO2: 24 mmol/L (ref 22–29)
CREATININE: 1.72 mg/dL — AB (ref 0.70–1.30)
Calcium: 9.3 mg/dL (ref 8.4–10.4)
GFR, Est AFR Am: 45 mL/min — ABNORMAL LOW (ref >60)
GFR, Estimated: 39 mL/min — ABNORMAL LOW (ref >60)
GLUCOSE: 97 mg/dL (ref 70–140)
Potassium: 4.9 mmol/L (ref 3.5–5.1)
SODIUM: 145 mmol/L (ref 136–145)
Total Bilirubin: 0.9 mg/dL (ref 0.2–1.2)
Total Protein: 6.4 g/dL (ref 6.4–8.3)

## 2017-12-24 LAB — RETICULOCYTES
RBC.: 3.66 MIL/uL — AB (ref 4.20–5.82)
RETIC COUNT ABSOLUTE: 36.6 10*3/uL (ref 34.8–93.9)
RETIC CT PCT: 1 % (ref 0.8–1.8)

## 2017-12-24 LAB — C-REACTIVE PROTEIN: CRP: <0.8 mg/dL (ref <1.0)

## 2017-12-24 MED ORDER — SODIUM CHLORIDE 0.9 % IV SOLN
60.0000 mg | Freq: Once | INTRAVENOUS | Status: AC
  Administered 2017-12-24: 60 mg via INTRAVENOUS
  Filled 2017-12-24: qty 10

## 2017-12-24 NOTE — Patient Instructions (Signed)
Pamidronate (Aredia) injection What is this medicine? PAMIDRONATE (pa mi DROE nate) slows calcium loss from bones. It is used to treat high calcium blood levels from cancer or Paget's disease. It is also used to treat bone pain and prevent fractures from certain cancers that have spread to the bone. This medicine may be used for other purposes; ask your health care provider or pharmacist if you have questions. COMMON BRAND NAME(S): Aredia What should I tell my health care provider before I take this medicine? They need to know if you have any of these conditions: -aspirin-sensitive asthma -dental disease -kidney disease -an unusual or allergic reaction to pamidronate, other medicines, foods, dyes, or preservatives -pregnant or trying to get pregnant -breast-feeding How should I use this medicine? This medicine is for infusion into a vein. It is given by a health care professional in a hospital or clinic setting. Talk to your pediatrician regarding the use of this medicine in children. This medicine is not approved for use in children. Overdosage: If you think you have taken too much of this medicine contact a poison control center or emergency room at once. NOTE: This medicine is only for you. Do not share this medicine with others. What if I miss a dose? This does not apply. What may interact with this medicine? -certain antibiotics given by injection -medicines for inflammation or pain like ibuprofen, naproxen -some diuretics like bumetanide, furosemide -cyclosporine -parathyroid hormone -tacrolimus -teriparatide -thalidomide This list may not describe all possible interactions. Give your health care provider a list of all the medicines, herbs, non-prescription drugs, or dietary supplements you use. Also tell them if you smoke, drink alcohol, or use illegal drugs. Some items may interact with your medicine. What should I watch for while using this medicine? Visit your doctor or  health care professional for regular checkups. It may be some time before you see the benefit from this medicine. Do not stop taking your medicine unless your doctor tells you to. Your doctor may order blood tests or other tests to see how you are doing. Women should inform their doctor if they wish to become pregnant or think they might be pregnant. There is a potential for serious side effects to an unborn child. Talk to your health care professional or pharmacist for more information. You should make sure that you get enough calcium and vitamin D while you are taking this medicine. Discuss the foods you eat and the vitamins you take with your health care professional. Some people who take this medicine have severe bone, joint, and/or muscle pain. This medicine may also increase your risk for a broken thigh bone. Tell your doctor right away if you have pain in your upper leg or groin. Tell your doctor if you have any pain that does not go away or that gets worse. What side effects may I notice from receiving this medicine? Side effects that you should report to your doctor or health care professional as soon as possible: -allergic reactions like skin rash, itching or hives, swelling of the face, lips, or tongue -black or tarry stools -changes in vision -eye inflammation, pain -high blood pressure -jaw pain, especially burning or cramping -muscle weakness -numb, tingling pain -swelling of feet or hands -trouble passing urine or change in the amount of urine -unable to move easily Side effects that usually do not require medical attention (report to your doctor or health care professional if they continue or are bothersome): -bone, joint, or muscle pain -constipation -dizzy,   drowsy -fever -headache -loss of appetite -nausea, vomiting -pain at site where injected This list may not describe all possible side effects. Call your doctor for medical advice about side effects. You may report side  effects to FDA at 1-800-FDA-1088. Where should I keep my medicine? This drug is given in a hospital or clinic and will not be stored at home. NOTE: This sheet is a summary. It may not cover all possible information. If you have questions about this medicine, talk to your doctor, pharmacist, or health care provider.  2018 Elsevier/Gold Standard (2011-04-04 08:49:49)  

## 2017-12-24 NOTE — Progress Notes (Signed)
Richard Vang    HEMATOLOGY/ONCOLOGY CLINIC NOTE  Date of Service: 12/24/17  Patient Care Team: Sandi Mariscal, MD as PCP - General (Internal Medicine)  CHIEF COMPLAINTS/PURPOSE OF CONSULTATION:   F/u for continue mx of myeloma  HISTORY OF PRESENTING ILLNESS:  plz see previous note for details on HPI  INTERVAL HISTORY  Richard Vang is here for his scheduled followup for multiple myeloma. The patient's last visit with [name] was on 11/26/17. The pt reports that he is doing well overall.   He notes that his back pain persists unchanged. He is still taking the Ninlaro and reports no problems taking this; he notes that he has had finger-tip numbness for a while and may or may not notice a little worsening.   Lab results today (12/24/17) of CBC, CMP is as follows: all values are WNL except for RBC at 3.66, Hgb at 9.8, HCT at 31.8, MCH at 26.8, MCHC at 30.8, RDW at 18.6, Platelets at 91k, Lymphs Abs at 600, Chloride at 112, BUN at 32, Creatinine at 1.72. Kappa/lambda light chains 12/24/17  stable Sedimentation Rate 12/24/17 is WNL at 12.  On review of systems, pt reports occasional finger-tip numbness,  and denies abdominal pain, leg swelling and any other symptoms.    MEDICAL HISTORY:  Past Medical History:  Diagnosis Date  . Arthritis    "left shoulder" (07/31/2016)  . Coronary artery disease   . GERD (gastroesophageal reflux disease)   . Heart murmur   . High cholesterol   . Hypertension   . Multiple myeloma ([place])   . Sleep apnea    "probably; having test in November" (07/31/2016)  . Type II diabetes mellitus ([place])     SURGICAL HISTORY: Past Surgical History:  Procedure Laterality Date  . CARDIAC CATHETERIZATION N/A 07/31/2016   Procedure: Left Heart Cath and Coronary Angiography;  Surgeon: Lorretta Harp, MD;  Location: [facility];  Service: Cardiovascular;  Laterality: N/A;  . CARDIAC CATHETERIZATION N/A 07/31/2016   Procedure: Coronary Stent Intervention;  Surgeon: Lorretta Harp, MD;   Location: [facility];  Service: Cardiovascular;  Laterality: N/A;  . CORONARY ANGIOPLASTY    . SHOULDER SURGERY Left 1973   "put pin in it where it had separated"   . TONSILLECTOMY  ~ 1956    SOCIAL HISTORY: Social History   Socioeconomic History  . Marital status: Divorced    Spouse name: Not on file  . Number of children: Not on file  . Years of education: Not on file  . Highest education level: Not on file  Social Needs  . Financial resource strain: Not on file  . Food insecurity - worry: Not on file  . Food insecurity - inability: Not on file  . Transportation needs - medical: Not on file  . Transportation needs - non-medical: Not on file  Occupational History  . Not on file  Tobacco Use  . Smoking status: Never Smoker  . Smokeless tobacco: Never Used  Substance and Sexual Activity  . Alcohol use: Yes    Comment: 07/31/2016 "nothing since 2002"  . Drug use: No  . Sexual activity: Not Currently  Other Topics Concern  . Not on file  Social History Narrative  . Not on file    FAMILY HISTORY: Family History  Problem Relation Age of Onset  . Hypertension Other     ALLERGIES:  has No Known Allergies.  MEDICATIONS:  Current Outpatient Medications  Medication Sig Dispense Refill  . acyclovir (ZOVIRAX)  400 MG tablet TAKE 1 TABLET BY MOUTH TWICE DAILY 60 tablet 3  . amLODipine (NORVASC) 5 MG tablet Take 5 mg by mouth daily.    Richard Vang atorvastatin (LIPITOR) 40 MG tablet Take 1 tablet (40 mg total) by mouth at bedtime. 30 tablet 12  . carvedilol (COREG) 12.5 MG tablet Take 12.5 mg by mouth 2 (two) times daily with a meal.    . Cholecalciferol (VITAMIN D3) 5000 units CAPS Take 1 capsule by mouth daily with breakfast.     . clopidogrel (PLAVIX) 75 MG tablet Take 1 tablet (75 mg total) by mouth daily with breakfast. 30 tablet 12  . Cyanocobalamin (VITAMIN B-12) 2500 MCG SUBL Place 1 tablet under the tongue daily with breakfast.    . escitalopram (LEXAPRO) 10 MG tablet  Take 10 mg by mouth daily with breakfast.    . furosemide (LASIX) 40 MG tablet Take 40 mg by mouth daily with breakfast.     . insulin aspart (NOVOLOG) 100 UNIT/ML injection Correction coverage: Sensitive (thin, NPO, renal)  CBG < 70: implement hypoglycemia protocol  CBG 70 - 120: 0 units  CBG 121 - 150: 1 unit  CBG 151 - 200: 2 units  CBG 201 - 250: 3 units  CBG 251 - 300: 5 units  CBG 301 - 350: 7 units  CBG 351 - 400 9 units  CBG > 400 call MD and obtain STAT lab verification 10 mL 11  . montelukast (SINGULAIR) 10 MG tablet Take 10 mg by mouth daily with breakfast.     . NINLARO 3 MG capsule TAKE 1 CAPSULE (3 MG TOTAL) BY MOUTH ONCE A WEEK. ON DAY 1,8,15 EVERY 28 DAYS. TAKE ON AN EMPTY STOMACH 1HR BEFORE OR 2HRS AFTER FOOD. 3 capsule 1  . nitroGLYCERIN (NITROSTAT) 0.3 MG SL tablet Place 0.3 mg under the tongue every 5 (five) minutes as needed for chest pain.     Richard Vang omeprazole (PRILOSEC) 40 MG capsule Take 40 mg by mouth daily with breakfast.     . ondansetron (ZOFRAN) 8 MG tablet Take 1 tablet (8 mg total) by mouth 2 (two) times daily as needed for refractory nausea / vomiting. Starting on days 4 and 11 not on day 8. 30 tablet 1  . oxyCODONE-acetaminophen (PERCOCET) 10-325 MG tablet Take 1 tablet by mouth 2 (two) times daily.    . polyethylene glycol (MIRALAX / GLYCOLAX) packet Take 17 g by mouth daily as needed for mild constipation.     . potassium chloride SA (K-DUR,KLOR-CON) 20 MEQ tablet Take 1 tablet (20 mEq total) by mouth daily. (Patient taking differently: Take 20 mEq by mouth daily with breakfast. )    . prochlorperazine (COMPAZINE) 10 MG tablet Take 1 tablet (10 mg total) by mouth every 6 (six) hours as needed (Nausea or vomiting). 30 tablet 1  . warfarin (COUMADIN) 4 MG tablet Take 4 mg by mouth daily.     No current facility-administered medications for this visit.    Facility-Administered Medications Ordered in Other Visits  Medication Dose Route Frequency Provider Last Rate  Last Dose  . 0.9 %  sodium chloride infusion   Intravenous Continuous Brunetta Genera, MD 10 mL/hr at 07/09/17 1423    . pamidronate (AREDIA) 60 mg in sodium chloride 0.9 % 500 mL IVPB  60 mg Intravenous Once Brunetta Genera, MD 127.5 mL/hr at 12/24/17 1333 60 mg at 12/24/17 1333    REVIEW OF SYSTEMS:    .10 Point review of Systems was  done is negative except as noted above.    PHYSICAL EXAMINATION:  ECOG PERFORMANCE STATUS: 2  . GENERAL:alert, in no acute distress and comfortable SKIN: no acute rashes, no significant lesions EYES: conjunctiva are pink and non-injected, sclera anicteric OROPHARYNX: MMM, no exudates, no oropharyngeal erythema or ulceration NECK: supple, no JVD LYMPH:  no palpable lymphadenopathy in the cervical, axillary or inguinal regions LUNGS: clear to auscultation b/l with normal respiratory effort HEART: regular rate & rhythm ABDOMEN:  normoactive bowel sounds , non tender, not distended. Extremity: no pedal edema PSYCH: alert & oriented x 3 with fluent speech NEURO: no focal motor/sensory deficits     LABORATORY DATA:  I have reviewed the data as listed . CBC Latest Ref Rng & Units 12/24/2017 11/26/2017 10/29/2017  WBC 4.0 - 10.3 K/uL 4.7 5.8 4.8  Hemoglobin 13.0 - 17.1 g/dL 9.8(L) - -  Hematocrit 38.4 - 49.9 % 31.8(L) 31.8(L) 31.0(L)  Platelets 140 - 400 K/uL 91(L) 109(L) 108(L)   . CBC    Component Value Date/Time   WBC 4.7 12/24/2017 1215   RBC 3.66 (L) 12/24/2017 1215   RBC 3.66 (L) 12/24/2017 1215   HGB 9.8 (L) 12/24/2017 1215   HGB 9.2 (L) 10/01/2017 1112   HCT 31.8 (L) 12/24/2017 1215   HCT 29.3 (L) 10/01/2017 1112   PLT 91 (L) 12/24/2017 1215   PLT 109 (L) 11/26/2017 1101   PLT 170 10/01/2017 1112   MCV 86.9 12/24/2017 1215   MCV 88.5 10/01/2017 1112   MCH 26.8 (L) 12/24/2017 1215   MCHC 30.8 (L) 12/24/2017 1215   RDW 18.6 (H) 12/24/2017 1215   RDW 15.3 (H) 10/01/2017 1112   LYMPHSABS 0.6 (L) 12/24/2017 1215   LYMPHSABS  0.5 (L) 10/01/2017 1112   MONOABS 0.4 12/24/2017 1215   MONOABS 0.5 10/01/2017 1112   EOSABS 0.1 12/24/2017 1215   EOSABS 0.1 10/01/2017 1112   BASOSABS 0.0 12/24/2017 1215   BASOSABS 0.0 10/01/2017 1112    . CMP Latest Ref Rng & Units 12/24/2017 11/26/2017 10/29/2017  Glucose 70 - 140 mg/dL 97 89 102  BUN 7 - 26 mg/dL 32(H) 40(H) 29(H)  Creatinine 0.70 - 1.30 mg/dL 1.72(H) 1.61(H) 1.50(H)  Sodium 136 - 145 mmol/L 145 141 142  Potassium 3.5 - 5.1 mmol/L 4.9 4.6 4.1  Chloride 98 - 109 mmol/L 112(H) 109 109  CO2 22 - 29 mmol/L _0 Calcium 8.4 - 10.4 mg/dL 9.3 9.1 9.0  Total Protein 6.4 - 8.3 g/dL 6.4 6.5 6.6  Total Bilirubin 0.2 - 1.2 mg/dL 0.9 0.7 1.1  Alkaline Phos 40 - 150 U/L 103 116 134  AST 5 - 34 U/L _1 ALT 0 - 55 U/L _2 RADIOGRAPHIC STUDIES: I have personally reviewed the radiological images as listed and agreed with the findings in the report. No results found.  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  K/L 95, No M spike on SPEP -- suggests light chain  MM Cytogenetics - normal male chromosomes FISH- +11 and 13q-/-13  Patient is s/p 1 cycle of Vd and Serum free kappa LC has decreased from 700 to 203.6 with improvement in his K/L ratio from 94.59 to 29. Completed 2nd cycle of treatment with Vd + Cytoxan (218m/m2) Completed 3rd cycle of treatment with Vd + Cytoxan (2038mm2) - cytoxan held D15 due  to thrombocytopenia Completed 4th of VCd Discontinued VCd after C5D8 due to intolerance with diarrhea and increasing neuropathy and fatigue with borderline functional status. -Patient did not tolerate maintenance Revlimid 10 mg by mouth daily due to grade 2-3 diarrhea. This was discontinued and his diarrhea has resolved.  Currently on maintenance Ninlaro M spike absent  Plan -tolerating maintenance  Ninalro 22m52m1/8/15 q28days and finished C4 -He has tolerated Ninlaro well thus far, no prohibitive toxicities.  -Continue B12 replacement -Continue acyclovir for shingles prophylaxis. -Continue aspirin -continue Pamidronate q4weeks -we discussed available labs. SFLC today -stable. Hgb/anemia stable hgb 9.8. -continue Ninlaro. He is hesistant to switch to any other medications unless he has overt evidence of definitive progressive disease. Patient hesitant to change current meds due to concern regarding treatment tolerance             2) CAD s/p prox LAD DES PCI on 07/31/2016 with ischemic cardiomyopathy ejection fraction of 25-35%. Rpt ECHO on 10/03/2016 shows improvement in EF to 55-60% with no RWMABN.  3) Macrocytic Anemia with moderate thrombocytopenia - due to MM and and Ninlaro. B12  Levels 1865 with adequate replacement. Hgb at 9.8.   5) Thrombocytopenia - due to MM and treatment --resolved today 91k. --will monitor  6) B12 deficiency - Received [place] B12 daily while in the hospital. Plan -cont SL B12 1000m55mo daily  6) b/l Lower extremity swelling likely due to CHF an CKD. Much improved. Plan -continued adjustment and monitoring of diuretic therapy as per PCP and cardiology  .7) CAD s/p recent DES 8) h/o Afib with RVR on coumadin Some bradycardia with BB -continue mx per PCP and cardiology  9) s/p diarrhea - resolved. Stool studies for GI pathogen panel and C. Difficile neg S/p empiric treatment for c diff with po vancomycin Resolved off Revlimid.  Continue Pamidronate  q4weeks RTC with Dr KaleIrene Limbo4 weeks with labs    All of the patients questions were answered with apparent satisfaction. The patient knows to call the clinic with any problems, questions or concerns.  . The total time spent in the appointment was 25 minutes and more than 50% was on counseling and direct patient cares.    GautSullivan LoneMS AGenolaIVMS SCH [employer] [facility]/Oncology Physician [facility]):       [phone] cell):  [phone]):           [phone] document serves as a record of services personally performed by GautSullivan Lone. It was created on his behalf by SchuBaldwin Jamaicatrained medical scribe. The creation of this record is based on the scribe's personal observations and the provider's statements to them.   .I have reviewed the above documentation for accuracy and completeness, and I agree with the above. .GauBrunetta GeneraMS

## 2017-12-25 LAB — KAPPA/LAMBDA LIGHT CHAINS
Kappa free light chain: 72.3 mg/L — ABNORMAL HIGH (ref 3.3–19.4)
Kappa, lambda light chain ratio: 6.4 — ABNORMAL HIGH (ref 0.26–1.65)
LAMDA FREE LIGHT CHAINS: 11.3 mg/L (ref 5.7–26.3)

## 2017-12-28 ENCOUNTER — Telehealth: Payer: Self-pay | Admitting: Hematology

## 2017-12-28 MED FILL — NINLARO 3 MG CAPSULE: 3 | 28 days supply | Qty: 3 | Fill #1

## 2017-12-28 NOTE — Telephone Encounter (Signed)
Mailed patient calendar of upcoming April appointments.  °

## 2018-01-20 NOTE — Progress Notes (Signed)
Richard Vang    HEMATOLOGY/ONCOLOGY CLINIC NOTE  Date of Service: 01/21/18  Patient Care Team: Sandi Mariscal, MD as PCP - General (Internal Medicine)  CHIEF COMPLAINTS/PURPOSE OF CONSULTATION:   F/u for continued management of Myeloma   HISTORY OF PRESENTING ILLNESS:  plz see previous note for details on HPI  INTERVAL HISTORY  Richard Vang is here for his scheduled followup for multiple myeloma. The patient's last visit with [name] was on 12/24/17. He is accompanied today by his grandson. The pt reports that he is doing well overall.   The pt reports that he has significant back pain that comes and goes. He notes that his back pain gets worse as he walks, but that overall, it is worse than it has been in the past.   The pt also presents today with a rough skin spot on his left temple.  Lab results today (01/21/18) of CBC, CMP, and Reticulocytes is as follows: all values are WNL except for RBC at 3.93, Hgb at 10.5, HCT at 33.7, MCH at 26.7, MCHC at 31.2, RDW at 18.3, Platelet at 109k, Lymphs Abs at 0.7k, Chloride at 111, BUN at 37, Creatinine at 1.54, AST at 46, Retic Ct Pct at 0.6%, Retic ct Abs at 23.6. Kappa/Lambda 12/24/17 showed Kappa elevated at 72.3 and K:L ratio at 6.40.  Kappa/lambda and MMP today, 01/21/18 show stable disease.   On review of systems, pt reports back pain, lower abdomen pain, and denies leg swelling, and any other symptoms.    MEDICAL HISTORY:  Past Medical History:  Diagnosis Date  . Arthritis    "left shoulder" (07/31/2016)  . Coronary artery disease   . GERD (gastroesophageal reflux disease)   . Heart murmur   . High cholesterol   . Hypertension   . Multiple myeloma ([place])   . Sleep apnea    "probably; having test in November" (07/31/2016)  . Type II diabetes mellitus ([place])     SURGICAL HISTORY: Past Surgical History:  Procedure Laterality Date  . CARDIAC CATHETERIZATION N/A 07/31/2016   Procedure: Left Heart Cath and Coronary Angiography;  Surgeon: Lorretta Harp, MD;   Location: [facility];  Service: Cardiovascular;  Laterality: N/A;  . CARDIAC CATHETERIZATION N/A 07/31/2016   Procedure: Coronary Stent Intervention;  Surgeon: Lorretta Harp, MD;  Location: [facility];  Service: Cardiovascular;  Laterality: N/A;  . CORONARY ANGIOPLASTY    . SHOULDER SURGERY Left 1973   "put pin in it where it had separated"   . TONSILLECTOMY  ~ 1956    SOCIAL HISTORY: Social History   Socioeconomic History  . Marital status: Divorced    Spouse name: Not on file  . Number of children: Not on file  . Years of education: Not on file  . Highest education level: Not on file  Occupational History  . Not on file  Social Needs  . Financial resource strain: Not on file  . Food insecurity:    Worry: Not on file    Inability: Not on file  . Transportation needs:    Medical: Not on file    Non-medical: Not on file  Tobacco Use  . Smoking status: Never Smoker  . Smokeless tobacco: Never Used  Substance and Sexual Activity  . Alcohol use: Yes    Comment: 07/31/2016 "nothing since 2002"  . Drug use: No  . Sexual activity: Not Currently  Lifestyle  . Physical activity:    Days per week: Not on file  Minutes per session: Not on file  . Stress: Not on file  Relationships  . Social connections:    Talks on phone: Not on file    Gets together: Not on file    Attends religious service: Not on file    Active member of club or organization: Not on file    Attends meetings of clubs or organizations: Not on file    Relationship status: Not on file  . Intimate partner violence:    Fear of current or ex partner: Not on file    Emotionally abused: Not on file    Physically abused: Not on file    Forced sexual activity: Not on file  Other Topics Concern  . Not on file  Social History Narrative  . Not on file    FAMILY HISTORY: Family History  Problem Relation Age of Onset  . Hypertension Other     ALLERGIES:  has No Known  Allergies.  MEDICATIONS:  Current Outpatient Medications  Medication Sig Dispense Refill  . acyclovir (ZOVIRAX) 400 MG tablet TAKE 1 TABLET BY MOUTH TWICE DAILY 60 tablet 3  . amLODipine (NORVASC) 5 MG tablet Take 5 mg by mouth daily.    Richard Vang atorvastatin (LIPITOR) 40 MG tablet Take 1 tablet (40 mg total) by mouth at bedtime. 30 tablet 12  . carvedilol (COREG) 12.5 MG tablet Take 12.5 mg by mouth 2 (two) times daily with a meal.    . Cholecalciferol (VITAMIN D3) 5000 units CAPS Take 1 capsule by mouth daily with breakfast.     . clopidogrel (PLAVIX) 75 MG tablet Take 1 tablet (75 mg total) by mouth daily with breakfast. 30 tablet 12  . Cyanocobalamin (VITAMIN B-12) 2500 MCG SUBL Place 1 tablet under the tongue daily with breakfast.    . escitalopram (LEXAPRO) 10 MG tablet Take 10 mg by mouth daily with breakfast.    . furosemide (LASIX) 40 MG tablet Take 40 mg by mouth daily with breakfast.     . insulin aspart (NOVOLOG) 100 UNIT/ML injection Correction coverage: Sensitive (thin, NPO, renal)  CBG < 70: implement hypoglycemia protocol  CBG 70 - 120: 0 units  CBG 121 - 150: 1 unit  CBG 151 - 200: 2 units  CBG 201 - 250: 3 units  CBG 251 - 300: 5 units  CBG 301 - 350: 7 units  CBG 351 - 400 9 units  CBG > 400 call MD and obtain STAT lab verification 10 mL 11  . montelukast (SINGULAIR) 10 MG tablet Take 10 mg by mouth daily with breakfast.     . nitroGLYCERIN (NITROSTAT) 0.3 MG SL tablet Place 0.3 mg under the tongue every 5 (five) minutes as needed for chest pain.     Richard Vang omeprazole (PRILOSEC) 40 MG capsule Take 40 mg by mouth daily with breakfast.     . ondansetron (ZOFRAN) 8 MG tablet Take 1 tablet (8 mg total) by mouth 2 (two) times daily as needed for refractory nausea / vomiting. Starting on days 4 and 11 not on day 8. 30 tablet 1  . oxyCODONE-acetaminophen (PERCOCET) 10-325 MG tablet Take 1 tablet by mouth 2 (two) times daily.    . polyethylene glycol (MIRALAX / GLYCOLAX) packet Take 17 g  by mouth daily as needed for mild constipation.     . potassium chloride SA (K-DUR,KLOR-CON) 20 MEQ tablet Take 1 tablet (20 mEq total) by mouth daily. (Patient taking differently: Take 20 mEq by mouth daily with breakfast. )    .  prochlorperazine (COMPAZINE) 10 MG tablet Take 1 tablet (10 mg total) by mouth every 6 (six) hours as needed (Nausea or vomiting). 30 tablet 1  . warfarin (COUMADIN) 4 MG tablet Take 4 mg by mouth daily.    . clotrimazole-betamethasone (LOTRISONE) cream Apply 1 application topically 2 (two) times daily for 8 days. Rash on rt forehead 15 g 0  . NINLARO 3 MG capsule TAKE 1 CAPSULE (3 MG TOTAL) BY MOUTH ONCE A WEEK. ON DAY 1,8,15 EVERY 28 DAYS. TAKE ON AN EMPTY STOMACH 1HR BEFORE OR 2HRS AFTER FOOD. 3 capsule 1   No current facility-administered medications for this visit.    Facility-Administered Medications Ordered in Other Visits  Medication Dose Route Frequency Provider Last Rate Last Dose  . 0.9 %  sodium chloride infusion   Intravenous Continuous Brunetta Genera, MD 10 mL/hr at 07/09/17 1423      REVIEW OF SYSTEMS:   .10 Point review of Systems was done is negative except as noted above.  PHYSICAL EXAMINATION:  ECOG PERFORMANCE STATUS: 2 VS reviewed . GENERAL:alert, in no acute distress and comfortable SKIN: rt temple scaly rash EYES: conjunctiva are pink and non-injected, sclera anicteric OROPHARYNX: MMM, no exudates, no oropharyngeal erythema or ulceration NECK: supple, no JVD LYMPH:  no palpable lymphadenopathy in the cervical, axillary or inguinal regions LUNGS: clear to auscultation b/l with normal respiratory effort HEART: regular rate & rhythm ABDOMEN:  normoactive bowel sounds , non tender, not distended. Extremity: no pedal edema PSYCH: alert & oriented x 3 with fluent speech NEURO: no focal motor/sensory deficits     LABORATORY DATA:  I have reviewed the data as listed . CBC Latest Ref Rng & Units 01/21/2018 12/24/2017 11/26/2017  WBC  4.0 - 10.3 K/uL 4.9 4.7 5.8  Hemoglobin 13.0 - 17.1 g/dL - 9.8(L) -  Hematocrit 38.4 - 49.9 % 33.7(L) 31.8(L) 31.8(L)  Platelets 140 - 400 K/uL 109(L) 91(L) 109(L)  HGB 10.5 . CBC    Component Value Date/Time   WBC 4.9 01/21/2018 1218   WBC 4.7 12/24/2017 1215   RBC 3.93 (L) 01/21/2018 1218   RBC 3.93 (L) 01/21/2018 1218   HGB 9.8 (L) 12/24/2017 1215   HGB 9.2 (L) 10/01/2017 1112   HCT 33.7 (L) 01/21/2018 1218   HCT 29.3 (L) 10/01/2017 1112   PLT 109 (L) 01/21/2018 1218   PLT 170 10/01/2017 1112   MCV 85.8 01/21/2018 1218   MCV 88.5 10/01/2017 1112   MCH 26.7 (L) 01/21/2018 1218   MCHC 31.2 (L) 01/21/2018 1218   RDW 18.3 (H) 01/21/2018 1218   RDW 15.3 (H) 10/01/2017 1112   LYMPHSABS 0.7 (L) 01/21/2018 1218   LYMPHSABS 0.5 (L) 10/01/2017 1112   MONOABS 0.4 01/21/2018 1218   MONOABS 0.5 10/01/2017 1112   EOSABS 0.1 01/21/2018 1218   EOSABS 0.1 10/01/2017 1112   BASOSABS 0.0 01/21/2018 1218   BASOSABS 0.0 10/01/2017 1112    . CMP Latest Ref Rng & Units 01/21/2018 12/24/2017 11/26/2017  Glucose 70 - 140 mg/dL 94 97 89  BUN 7 - 26 mg/dL 37(H) 32(H) 40(H)  Creatinine 0.70 - 1.30 mg/dL 1.54(H) 1.72(H) 1.61(H)  Sodium 136 - 145 mmol/L 142 145 141  Potassium 3.5 - 5.1 mmol/L 4.4 4.9 4.6  Chloride 98 - 109 mmol/L 111(H) 112(H) 109  CO2 22 - 29 mmol/L '26 24 25  ' Calcium 8.4 - 10.4 mg/dL 9.0 9.3 9.1  Total Protein 6.4 - 8.3 g/dL 6.5 6.4 6.5  Total Bilirubin 0.2 - 1.2  mg/dL 0.7 0.9 0.7  Alkaline Phos 40 - 150 U/L 105 103 116  AST 5 - 34 U/L 46(H) 20 28  ALT 0 - 55 U/L 37 20 24          RADIOGRAPHIC STUDIES: I have personally reviewed the radiological images as listed and agreed with the findings in the report. Dg Lumbar Spine 2-3 Views  Result Date: 01/22/2018 CLINICAL DATA:  Multiple myeloma.  Worsening back pain EXAM: LUMBAR SPINE - 2-3 VIEW COMPARISON:  CT lumbar spine 10/02/2016 FINDINGS: Moderately severe compression fractures at T10, T11, T12, and L1 have progressed  since the prior study. Mild compression fracture of L2 has progressed. Moderate to severe compression fracture of L4 has progressed with diffuse sclerosis in the vertebral body. Generalized osteopenia.  Atherosclerotic aorta. IMPRESSION: Osteopenia. Multiple compression fractures with progression from the prior study. Findings suggest myeloma. Electronically Signed   By: Franchot Gallo M.D.   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  K/L 95, No M spike on SPEP -- suggests light chain MM Cytogenetics - normal male chromosomes FISH- +11 and 13q-/-13  Patient is s/p 1 cycle of Vd and Serum free kappa LC has decreased from 700 to 203.6 with improvement in his K/L ratio from 94.59 to 29. Completed 2nd cycle of treatment with Vd + Cytoxan (254m/m2) Completed 3rd cycle of treatment with Vd + Cytoxan (2059mm2) - cytoxan held D15 due to thrombocytopenia Completed 4th of VCd Discontinued VCd after C5D8 due to intolerance with diarrhea and increasing neuropathy and fatigue with borderline functional status. -Patient did not tolerate maintenance Revlimid 10 mg by mouth daily due to grade 2-3 diarrhea. This was discontinued and his diarrhea has resolved.  Currently on maintenance Ninlaro M spike absent  Plan -tolerating maintenance  Ninalro 88m7m1/8/15 q28days and finished C5 -He has tolerated Ninlaro well thus far, no  prohibitive toxicities.  -labs show stable disease and so we shall continue Ninlaro at this time. -Continue B12 replacement -Continue acyclovir for shingles prophylaxis. -Continue aspirin -changed Pamidronate to q8weeks -Discussed pt labwork today; Hgb increased to 10.5 -The patient's worsening back pain is cause to get an XR today.  -Advised that the pt not lift anything heavy -Pt shows no prohibitive toxicities from his treatment at this time -Continue Ninlaro              2) CAD s/p prox LAD DES PCI on 07/31/2016 with ischemic cardiomyopathy ejection fraction of 25-35%. Rpt ECHO on 10/03/2016 shows improvement in EF to 55-60% with no RWMABN.  3) Macrocytic Anemia with moderate thrombocytopenia - due to MM and and Ninlaro. B12  Levels 1865 with adequate replacement.  5) Thrombocytopenia - due to MM and treatment --resolved today 91k. --will monitor  6) B12 deficiency - Received [place] B12 daily while in the hospital. Plan -cont SL B12 1000m12mo daily  6) b/l Lower extremity swelling likely due to CHF an CKD. Much improved. Plan -continued adjustment and monitoring of diuretic therapy as  per PCP and cardiology  .7) CAD s/p recent DES 8) h/o Afib with RVR on coumadin Some bradycardia with BB -continue mx per PCP and cardiology  9) s/p diarrhea - resolved. Stool studies for GI pathogen panel and C. Difficile neg S/p empiric treatment for c diff with po vancomycin Resolved off Revlimid.  10) Left temporal Seborrheic dermatitis vs fungal infection -Ordering Lotrisone  110 Back pain -- multiple chronic compression fractures with some worsening -on bisphosphonates already -Ergoclaciferol 50k units weekly 25oh vit D levels on next visit  Change Aredia to q8 weeks RTC with Dr Irene Limbo with labs in 4 weeks   All of the patients questions were answered with apparent satisfaction. The patient knows to call the clinic with any problems, questions or concerns.  . The total time  spent in the appointment was 25 minutes and more than 50% was on counseling and direct patient cares.   Richard Lone MD [place] [facility]IV[facility] Hematology/Oncology Physician [facility]  (Office):       [phone] (Work cell):  [phone] (Fax):           [phone]  This document serves as a record of services personally performed by Richard Lone, MD. It was created on his behalf by Baldwin [place], a trained medical scribe. The creation of this record is based on the scribe's personal observations and the provider's statements to them.   .I have reviewed the above documentation for accuracy and completeness, and I agree with the above. Brunetta Genera MD MS

## 2018-01-21 ENCOUNTER — Encounter: Payer: Self-pay | Admitting: Hematology

## 2018-01-21 ENCOUNTER — Inpatient Hospital Stay: Payer: Medicare HMO

## 2018-01-21 ENCOUNTER — Telehealth: Payer: Self-pay | Admitting: Hematology

## 2018-01-21 ENCOUNTER — Inpatient Hospital Stay: Payer: Medicare HMO | Attending: Hematology | Admitting: Hematology

## 2018-01-21 VITALS — BP 117/66 | HR 64 | Temp 98.6°F | Resp 20 | Ht 70.0 in | Wt 184.9 lb

## 2018-01-21 DIAGNOSIS — M549 Dorsalgia, unspecified: Secondary | ICD-10-CM | POA: Diagnosis not present

## 2018-01-21 DIAGNOSIS — I4891 Unspecified atrial fibrillation: Secondary | ICD-10-CM | POA: Insufficient documentation

## 2018-01-21 DIAGNOSIS — M1990 Unspecified osteoarthritis, unspecified site: Secondary | ICD-10-CM | POA: Diagnosis not present

## 2018-01-21 DIAGNOSIS — Z794 Long term (current) use of insulin: Secondary | ICD-10-CM | POA: Insufficient documentation

## 2018-01-21 DIAGNOSIS — R001 Bradycardia, unspecified: Secondary | ICD-10-CM | POA: Insufficient documentation

## 2018-01-21 DIAGNOSIS — E538 Deficiency of other specified B group vitamins: Secondary | ICD-10-CM | POA: Diagnosis not present

## 2018-01-21 DIAGNOSIS — C9000 Multiple myeloma not having achieved remission: Secondary | ICD-10-CM

## 2018-01-21 DIAGNOSIS — Z79899 Other long term (current) drug therapy: Secondary | ICD-10-CM

## 2018-01-21 DIAGNOSIS — R1030 Lower abdominal pain, unspecified: Secondary | ICD-10-CM

## 2018-01-21 DIAGNOSIS — I10 Essential (primary) hypertension: Secondary | ICD-10-CM

## 2018-01-21 DIAGNOSIS — Z7189 Other specified counseling: Secondary | ICD-10-CM

## 2018-01-21 DIAGNOSIS — L989 Disorder of the skin and subcutaneous tissue, unspecified: Secondary | ICD-10-CM | POA: Insufficient documentation

## 2018-01-21 DIAGNOSIS — D6959 Other secondary thrombocytopenia: Secondary | ICD-10-CM | POA: Diagnosis not present

## 2018-01-21 DIAGNOSIS — R600 Localized edema: Secondary | ICD-10-CM | POA: Insufficient documentation

## 2018-01-21 DIAGNOSIS — G4730 Sleep apnea, unspecified: Secondary | ICD-10-CM | POA: Diagnosis not present

## 2018-01-21 DIAGNOSIS — E7800 Pure hypercholesterolemia, unspecified: Secondary | ICD-10-CM | POA: Diagnosis not present

## 2018-01-21 DIAGNOSIS — K219 Gastro-esophageal reflux disease without esophagitis: Secondary | ICD-10-CM | POA: Diagnosis not present

## 2018-01-21 DIAGNOSIS — Z7901 Long term (current) use of anticoagulants: Secondary | ICD-10-CM | POA: Insufficient documentation

## 2018-01-21 DIAGNOSIS — D539 Nutritional anemia, unspecified: Secondary | ICD-10-CM | POA: Diagnosis not present

## 2018-01-21 DIAGNOSIS — E119 Type 2 diabetes mellitus without complications: Secondary | ICD-10-CM | POA: Diagnosis not present

## 2018-01-21 DIAGNOSIS — I2510 Atherosclerotic heart disease of native coronary artery without angina pectoris: Secondary | ICD-10-CM | POA: Diagnosis not present

## 2018-01-21 LAB — CBC WITH DIFFERENTIAL (CANCER CENTER ONLY)
Basophils Absolute: 0 10*3/uL (ref 0.0–0.1)
Basophils Relative: 0 %
EOS ABS: 0.1 10*3/uL (ref 0.0–0.5)
EOS PCT: 3 %
HCT: 33.7 % — ABNORMAL LOW (ref 38.4–49.9)
Hemoglobin: 10.5 g/dL — ABNORMAL LOW (ref 13.0–17.1)
Lymphocytes Relative: 14 %
Lymphs Abs: 0.7 10*3/uL — ABNORMAL LOW (ref 0.9–3.3)
MCH: 26.7 pg — AB (ref 27.2–33.4)
MCHC: 31.2 g/dL — AB (ref 32.0–36.0)
MCV: 85.8 fL (ref 79.3–98.0)
MONO ABS: 0.4 10*3/uL (ref 0.1–0.9)
MONOS PCT: 8 %
Neutro Abs: 3.6 10*3/uL (ref 1.5–6.5)
Neutrophils Relative %: 75 %
PLATELETS: 109 10*3/uL — AB (ref 140–400)
RBC: 3.93 MIL/uL — ABNORMAL LOW (ref 4.20–5.82)
RDW: 18.3 % — AB (ref 11.0–14.6)
WBC Count: 4.9 10*3/uL (ref 4.0–10.3)

## 2018-01-21 LAB — CMP (CANCER CENTER ONLY)
ALBUMIN: 3.7 g/dL (ref 3.5–5.0)
ALT: 37 U/L (ref 0–55)
AST: 46 U/L — AB (ref 5–34)
Alkaline Phosphatase: 105 U/L (ref 40–150)
Anion gap: 5 (ref 3–11)
BUN: 37 mg/dL — AB (ref 7–26)
CO2: 26 mmol/L (ref 22–29)
CREATININE: 1.54 mg/dL — AB (ref 0.70–1.30)
Calcium: 9 mg/dL (ref 8.4–10.4)
Chloride: 111 mmol/L — ABNORMAL HIGH (ref 98–109)
GFR, EST NON AFRICAN AMERICAN: 44 mL/min — AB (ref >60)
GFR, Est AFR Am: 51 mL/min — ABNORMAL LOW (ref >60)
GLUCOSE: 94 mg/dL (ref 70–140)
Potassium: 4.4 mmol/L (ref 3.5–5.1)
Sodium: 142 mmol/L (ref 136–145)
Total Bilirubin: 0.7 mg/dL (ref 0.2–1.2)
Total Protein: 6.5 g/dL (ref 6.4–8.3)

## 2018-01-21 LAB — RETICULOCYTES
RBC.: 3.93 MIL/uL — ABNORMAL LOW (ref 4.20–5.82)
RETIC CT PCT: 0.6 % — AB (ref 0.8–1.8)
Retic Count, Absolute: 23.6 10*3/uL — ABNORMAL LOW (ref 34.8–93.9)

## 2018-01-21 MED ORDER — CLOTRIMAZOLE-BETAMETHASONE 1-0.05 % EX CREA: 1.0000 "application " | TOPICAL_CREAM | Freq: Two times a day (BID) | CUTANEOUS | 0 refills | Status: AC

## 2018-01-21 MED ORDER — SODIUM CHLORIDE 0.9 % IV SOLN
60.0000 mg | Freq: Once | INTRAVENOUS | Status: AC
  Administered 2018-01-21: 60 mg via INTRAVENOUS
  Filled 2018-01-21: qty 10

## 2018-01-21 NOTE — Telephone Encounter (Signed)
Scheduled appt per 4/4 los - Patient to get an updated schedule in treatment area.

## 2018-01-21 NOTE — Patient Instructions (Signed)
Pamidronate injection What is this medicine? PAMIDRONATE (pa mi DROE nate) slows calcium loss from bones. It is used to treat high calcium blood levels from cancer or Paget's disease. It is also used to treat bone pain and prevent fractures from certain cancers that have spread to the bone. This medicine may be used for other purposes; ask your health care provider or pharmacist if you have questions. COMMON BRAND NAME(S): Aredia What should I tell my health care provider before I take this medicine? They need to know if you have any of these conditions: -aspirin-sensitive asthma -dental disease -kidney disease -an unusual or allergic reaction to pamidronate, other medicines, foods, dyes, or preservatives -pregnant or trying to get pregnant -breast-feeding How should I use this medicine? This medicine is for infusion into a vein. It is given by a health care professional in a hospital or clinic setting. Talk to your pediatrician regarding the use of this medicine in children. This medicine is not approved for use in children. Overdosage: If you think you have taken too much of this medicine contact a poison control center or emergency room at once. NOTE: This medicine is only for you. Do not share this medicine with others. What if I miss a dose? This does not apply. What may interact with this medicine? -certain antibiotics given by injection -medicines for inflammation or pain like ibuprofen, naproxen -some diuretics like bumetanide, furosemide -cyclosporine -parathyroid hormone -tacrolimus -teriparatide -thalidomide This list may not describe all possible interactions. Give your health care provider a list of all the medicines, herbs, non-prescription drugs, or dietary supplements you use. Also tell them if you smoke, drink alcohol, or use illegal drugs. Some items may interact with your medicine. What should I watch for while using this medicine? Visit your doctor or health care  professional for regular checkups. It may be some time before you see the benefit from this medicine. Do not stop taking your medicine unless your doctor tells you to. Your doctor may order blood tests or other tests to see how you are doing. Women should inform their doctor if they wish to become pregnant or think they might be pregnant. There is a potential for serious side effects to an unborn child. Talk to your health care professional or pharmacist for more information. You should make sure that you get enough calcium and vitamin D while you are taking this medicine. Discuss the foods you eat and the vitamins you take with your health care professional. Some people who take this medicine have severe bone, joint, and/or muscle pain. This medicine may also increase your risk for a broken thigh bone. Tell your doctor right away if you have pain in your upper leg or groin. Tell your doctor if you have any pain that does not go away or that gets worse. What side effects may I notice from receiving this medicine? Side effects that you should report to your doctor or health care professional as soon as possible: -allergic reactions like skin rash, itching or hives, swelling of the face, lips, or tongue -black or tarry stools -changes in vision -eye inflammation, pain -high blood pressure -jaw pain, especially burning or cramping -muscle weakness -numb, tingling pain -swelling of feet or hands -trouble passing urine or change in the amount of urine -unable to move easily Side effects that usually do not require medical attention (report to your doctor or health care professional if they continue or are bothersome): -bone, joint, or muscle pain -constipation -dizzy, drowsy -  fever -headache -loss of appetite -nausea, vomiting -pain at site where injected This list may not describe all possible side effects. Call your doctor for medical advice about side effects. You may report side effects to  FDA at 1-800-FDA-1088. Where should I keep my medicine? This drug is given in a hospital or clinic and will not be stored at home. NOTE: This sheet is a summary. It may not cover all possible information. If you have questions about this medicine, talk to your doctor, pharmacist, or health care provider.  2018 Elsevier/Gold Standard (2011-04-04 08:49:49)  

## 2018-01-22 ENCOUNTER — Other Ambulatory Visit: Payer: Self-pay | Admitting: Hematology

## 2018-01-22 ENCOUNTER — Ambulatory Visit (HOSPITAL_COMMUNITY)
Admission: RE | Admit: 2018-01-22 | Discharge: 2018-01-22 | Disposition: A | Discharge status: Home / Self Care | Payer: Medicare HMO | Source: Ambulatory Visit | Attending: Hematology | Admitting: Hematology

## 2018-01-22 DIAGNOSIS — C9000 Multiple myeloma not having achieved remission: Secondary | ICD-10-CM

## 2018-01-22 DIAGNOSIS — M8588 Other specified disorders of bone density and structure, other site: Secondary | ICD-10-CM | POA: Diagnosis not present

## 2018-01-22 DIAGNOSIS — M4854XA Collapsed vertebra, not elsewhere classified, thoracic region, initial encounter for fracture: Secondary | ICD-10-CM | POA: Diagnosis not present

## 2018-01-22 LAB — KAPPA/LAMBDA LIGHT CHAINS
Kappa free light chain: 64.5 mg/L — ABNORMAL HIGH (ref 3.3–19.4)
Kappa, lambda light chain ratio: 6.08 — ABNORMAL HIGH (ref 0.26–1.65)
LAMDA FREE LIGHT CHAINS: 10.6 mg/L (ref 5.7–26.3)

## 2018-01-25 LAB — MULTIPLE MYELOMA PANEL, SERUM
ALBUMIN SERPL ELPH-MCNC: 3.5 g/dL (ref 2.9–4.4)
ALPHA2 GLOB SERPL ELPH-MCNC: 0.6 g/dL (ref 0.4–1.0)
Albumin/Glob SerPl: 1.4 (ref 0.7–1.7)
Alpha 1: 0.2 g/dL (ref 0.0–0.4)
B-GLOBULIN SERPL ELPH-MCNC: 0.9 g/dL (ref 0.7–1.3)
Gamma Glob SerPl Elph-Mcnc: 1 g/dL (ref 0.4–1.8)
Globulin, Total: 2.6 g/dL (ref 2.2–3.9)
IGG (IMMUNOGLOBIN G), SERUM: 1016 mg/dL (ref 700–1600)
IgA: 44 mg/dL — ABNORMAL LOW (ref 61–437)
IgM (Immunoglobulin M), Srm: 27 mg/dL (ref 20–172)
TOTAL PROTEIN ELP: 6.1 g/dL (ref 6.0–8.5)

## 2018-01-25 MED FILL — NINLARO 3 MG CAPSULE: 3 | 28 days supply | Qty: 3 | Fill #0

## 2018-01-28 MED ORDER — ERGOCALCIFEROL 1.25 MG (50000 UT) PO CAPS: 50000.0000 [IU] | ORAL_CAPSULE | ORAL | 1 refills | Status: DC

## 2018-01-29 ENCOUNTER — Telehealth: Payer: Self-pay | Admitting: *Deleted

## 2018-01-29 NOTE — Telephone Encounter (Signed)
Per Dr. Irene Limbo, called pt to inform that myeloma labs are stable.  Informed rx for Vit D sent to pharmacy due to worsening compression fractures.  VM left with information, call back number provided.

## 2018-02-19 MED FILL — NINLARO 3 MG CAPSULE: 3 | 28 days supply | Qty: 3 | Fill #1

## 2018-02-24 NOTE — Progress Notes (Signed)
[name]    HEMATOLOGY/ONCOLOGY CLINIC NOTE  Date of Service: 02/25/18  Patient Care Team: Sandi Mariscal, MD as PCP - General (Internal Medicine)  CHIEF COMPLAINTS/PURPOSE OF CONSULTATION:   F/u for continued management of Myeloma   HISTORY OF PRESENTING ILLNESS:  plz see previous note for details on HPI  INTERVAL HISTORY  [name] is here for his scheduled followup for multiple myeloma. The patient's last visit with [name] was on 01/21/18. He is accompanied today by his grandson. The pt reports that he is doing well overall.   The pt reports persisting lower back pain that is accompanied by abdominal pain when he walks for longer distances. He notes that he is eating well and walking often. He denies any falls. He notes stable and unchanging b/l finger and feet tingling.  He notes no problems taking Ninlaro at this time. He continues taking Vitamin B12 replacement daily.   Of note since the patient's last visit, pt has had DG Lumbar Spine completed on 01/22/18 with results revealing Osteopenia. Multiple compression fractures with progression from the prior study. Findings suggest myeloma.  Lab results today (02/25/18) of CBC, CMP, and Reticulocytes is as follows: all values are WNL except for RBC at 3.85, Hgb at 10.5, Hct at 33.5, MCHC at 31.3, RDW at 18.9, Platelets at 132k, Lymphs Abs at 0.6k, Chloride at 111, BUN at 33, Creatinine at 1.59, Retic Ct abs at 34.7. MMP 02/25/18 is pending. Kappa/lambda 02/25/18 is pending.   On review of systems, pt reports lower back pain, stable finger tingling, stable feet tingling, and denies fevers, chills, night sweats, leg swelling, and any other symptoms.    MEDICAL HISTORY:  Past Medical History:  Diagnosis Date  . Arthritis    "left shoulder" (07/31/2016)  . Coronary artery disease   . GERD (gastroesophageal reflux disease)   . Heart murmur   . High cholesterol   . Hypertension   . Multiple myeloma ([place])   . Sleep apnea    "probably; having test in  November" (07/31/2016)  . Type II diabetes mellitus ([place])     SURGICAL HISTORY: Past Surgical History:  Procedure Laterality Date  . CARDIAC CATHETERIZATION N/A 07/31/2016   Procedure: Left Heart Cath and Coronary Angiography;  Surgeon: Lorretta Harp, MD;  Location: [place] [facility];  Service: Cardiovascular;  Laterality: N/A;  . CARDIAC CATHETERIZATION N/A 07/31/2016   Procedure: Coronary Stent Intervention;  Surgeon: Lorretta Harp, MD;  Location: [place] [facility];  Service: Cardiovascular;  Laterality: N/A;  . CORONARY ANGIOPLASTY    . SHOULDER SURGERY Left 1973   "put pin in it where it had separated"   . TONSILLECTOMY  ~ 1956    SOCIAL HISTORY: Social History   Socioeconomic History  . Marital status: Divorced    Spouse name: Not on file  . Number of children: Not on file  . Years of education: Not on file  . Highest education level: Not on file  Occupational History  . Not on file  Social Needs  . Financial resource strain: Not on file  . Food insecurity:    Worry: Not on file    Inability: Not on file  . Transportation needs:    Medical: Not on file    Non-medical: Not on file  Tobacco Use  . Smoking status: Never Smoker  . Smokeless tobacco: Never Used  Substance and Sexual Activity  . Alcohol use: Yes    Comment: 07/31/2016 "nothing since 2002"  . Drug  use: No  . Sexual activity: Not Currently  Lifestyle  . Physical activity:    Days per week: Not on file    Minutes per session: Not on file  . Stress: Not on file  Relationships  . Social connections:    Talks on phone: Not on file    Gets together: Not on file    Attends religious service: Not on file    Active member of club or organization: Not on file    Attends meetings of clubs or organizations: Not on file    Relationship status: Not on file  . Intimate partner violence:    Fear of current or ex partner: Not on file    Emotionally abused: Not on file    Physically abused: Not on file     Forced sexual activity: Not on file  Other Topics Concern  . Not on file  Social History Narrative  . Not on file    FAMILY HISTORY: Family History  Problem Relation Age of Onset  . Hypertension Other     ALLERGIES:  has No Known Allergies.  MEDICATIONS:  Current Outpatient Medications  Medication Sig Dispense Refill  . acyclovir (ZOVIRAX) 400 MG tablet TAKE 1 TABLET BY MOUTH TWICE DAILY 60 tablet 3  . amLODipine (NORVASC) 5 MG tablet Take 5 mg by mouth daily.    [name] atorvastatin (LIPITOR) 40 MG tablet Take 1 tablet (40 mg total) by mouth at bedtime. 30 tablet 12  . carvedilol (COREG) 12.5 MG tablet Take 12.5 mg by mouth 2 (two) times daily with a meal.    . clopidogrel (PLAVIX) 75 MG tablet Take 1 tablet (75 mg total) by mouth daily with breakfast. 30 tablet 12  . Cyanocobalamin (VITAMIN B-12) 2500 MCG SUBL Place 1 tablet under the tongue daily with breakfast.    . ergocalciferol (VITAMIN D2) 50000 units capsule Take 1 capsule (50,000 Units total) by mouth once a week. 12 capsule 1  . escitalopram (LEXAPRO) 10 MG tablet Take 10 mg by mouth daily with breakfast.    . furosemide (LASIX) 40 MG tablet Take 40 mg by mouth daily with breakfast.     . insulin aspart (NOVOLOG) 100 UNIT/ML injection Correction coverage: Sensitive (thin, NPO, renal)  CBG < 70: implement hypoglycemia protocol  CBG 70 - 120: 0 units  CBG 121 - 150: 1 unit  CBG 151 - 200: 2 units  CBG 201 - 250: 3 units  CBG 251 - 300: 5 units  CBG 301 - 350: 7 units  CBG 351 - 400 9 units  CBG > 400 call MD and obtain STAT lab verification 10 mL 11  . montelukast (SINGULAIR) 10 MG tablet Take 10 mg by mouth daily with breakfast.     . NINLARO 3 MG capsule TAKE 1 CAPSULE (3 MG TOTAL) BY MOUTH ONCE A WEEK. ON DAY 1,8,15 EVERY 28 DAYS. TAKE ON AN EMPTY STOMACH 1HR BEFORE OR 2HRS AFTER FOOD. 3 capsule 1  . nitroGLYCERIN (NITROSTAT) 0.3 MG SL tablet Place 0.3 mg under the tongue every 5 (five) minutes as needed for chest  pain.     [name] omeprazole (PRILOSEC) 40 MG capsule Take 40 mg by mouth daily with breakfast.     . ondansetron (ZOFRAN) 8 MG tablet Take 1 tablet (8 mg total) by mouth 2 (two) times daily as needed for refractory nausea / vomiting. Starting on days 4 and 11 not on day 8. 30 tablet 1  . oxyCODONE-acetaminophen (PERCOCET) 10-325 MG  tablet Take 1 tablet by mouth 2 (two) times daily.    . polyethylene glycol (MIRALAX / GLYCOLAX) packet Take 17 g by mouth daily as needed for mild constipation.     . potassium chloride SA (K-DUR,KLOR-CON) 20 MEQ tablet Take 1 tablet (20 mEq total) by mouth daily. (Patient taking differently: Take 20 mEq by mouth daily with breakfast. )    . prochlorperazine (COMPAZINE) 10 MG tablet Take 1 tablet (10 mg total) by mouth every 6 (six) hours as needed (Nausea or vomiting). 30 tablet 1  . warfarin (COUMADIN) 4 MG tablet Take 4 mg by mouth daily.     No current facility-administered medications for this visit.    Facility-Administered Medications Ordered in Other Visits  Medication Dose Route Frequency Provider Last Rate Last Dose  . 0.9 %  sodium chloride infusion   Intravenous Continuous Brunetta Genera, MD 10 mL/hr at 07/09/17 1423      REVIEW OF SYSTEMS:   A 10+ POINT REVIEW OF SYSTEMS WAS OBTAINED including neurology, dermatology, psychiatry, cardiac, respiratory, lymph, extremities, GI, GU, Musculoskeletal, constitutional, breasts, reproductive, HEENT.  All pertinent positives are noted in the HPI.  All others are negative.   PHYSICAL EXAMINATION:  ECOG PERFORMANCE STATUS: 2 VS reviewed  GENERAL:alert, in no acute distress and comfortable SKIN: rt temple scaly rash EYES: conjunctiva are pink and non-injected, sclera anicteric OROPHARYNX: MMM, no exudates, no oropharyngeal erythema or ulceration NECK: supple, no JVD LYMPH:  no palpable lymphadenopathy in the cervical, axillary or inguinal regions LUNGS: clear to auscultation b/l with normal respiratory  effort HEART: regular rate & rhythm ABDOMEN:  normoactive bowel sounds , non tender, not distended. Extremity: no pedal edema PSYCH: alert & oriented x 3 with fluent speech NEURO: no focal motor/sensory deficits   LABORATORY DATA:  I have reviewed the data as listed . CBC Latest Ref Rng & Units 02/25/2018 01/21/2018 12/24/2017  WBC 4.0 - 10.3 K/uL 4.8 4.9 4.7  Hemoglobin 13.0 - 17.1 g/dL 10.5(L) 10.5(L) 9.8(L)  Hematocrit 38.4 - 49.9 % 33.5(L) 33.7(L) 31.8(L)  Platelets 140 - 400 K/uL 132(L) 109(L) 91(L)  HGB 10.5 . CBC    Component Value Date/Time   WBC 4.8 02/25/2018 1219   WBC 4.7 12/24/2017 1215   RBC 3.85 (L) 02/25/2018 1219   RBC 3.85 (L) 02/25/2018 1219   HGB 10.5 (L) 02/25/2018 1219   HGB 9.2 (L) 10/01/2017 1112   HCT 33.5 (L) 02/25/2018 1219   HCT 29.3 (L) 10/01/2017 1112   PLT 132 (L) 02/25/2018 1219   PLT 170 10/01/2017 1112   MCV 87.0 02/25/2018 1219   MCV 88.5 10/01/2017 1112   MCH 27.3 02/25/2018 1219   MCHC 31.3 (L) 02/25/2018 1219   RDW 18.9 (H) 02/25/2018 1219   RDW 15.3 (H) 10/01/2017 1112   LYMPHSABS 0.6 (L) 02/25/2018 1219   LYMPHSABS 0.5 (L) 10/01/2017 1112   MONOABS 0.5 02/25/2018 1219   MONOABS 0.5 10/01/2017 1112   EOSABS 0.3 02/25/2018 1219   EOSABS 0.1 10/01/2017 1112   BASOSABS 0.0 02/25/2018 1219   BASOSABS 0.0 10/01/2017 1112    . CMP Latest Ref Rng & Units 02/25/2018 01/21/2018 12/24/2017  Glucose 70 - 140 mg/dL 102 94 97  BUN 7 - 26 mg/dL 33(H) 37(H) 32(H)  Creatinine 0.70 - 1.30 mg/dL 1.59(H) 1.54(H) 1.72(H)  Sodium 136 - 145 mmol/L 144 142 145  Potassium 3.5 - 5.1 mmol/L 4.4 4.4 4.9  Chloride 98 - 109 mmol/L 111(H) 111(H) 112(H)  CO2 22 -  29 mmol/L _0 Calcium 8.4 - 10.4 mg/dL 9.5 9.0 9.3  Total Protein 6.4 - 8.3 g/dL 6.8 6.5 6.4  Total Bilirubin 0.2 - 1.2 mg/dL 0.7 0.7 0.9  Alkaline Phos 40 - 150 U/L 115 105 103  AST 5 - 34 U/L 29 46(H) 20  ALT 0 - 55 U/L 25 37 20          RADIOGRAPHIC STUDIES: I have personally  reviewed the radiological images as listed and agreed with the findings in the report. No results found.  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  K/L 95, No M spike on SPEP -- suggests light chain MM Cytogenetics - normal male chromosomes FISH- +11 and 13q-/-13  Patient is s/p 1 cycle of Vd and Serum free kappa LC has decreased from 700 to 203.6 with improvement in his K/L ratio from 94.59 to 29. Completed 2nd cycle of treatment with Vd + Cytoxan (231m/m2) Completed 3rd cycle of treatment with Vd + Cytoxan (2055mm2) - cytoxan held D15 due to thrombocytopenia Completed 4th of VCd Discontinued VCd after C5D8 due to intolerance with diarrhea and increasing neuropathy and fatigue with borderline functional status. -Patient did not tolerate maintenance Revlimid 10 mg by mouth daily due to grade 2-3 diarrhea. This was discontinued and his diarrhea has resolved.  Currently on maintenance Ninlaro M spike absent  Plan -tolerating maintenance  Ninlaro 24m110m1/8/15 q28days -He has tolerated Ninlaro well thus far, no prohibitive toxicities.  -labs show stable disease and so we shall continue Ninlaro at this time. -Continue B12 replacement -Continue acyclovir for shingles prophylaxis. -Continue aspirin -continue Pamidronate to q8weeks -Continue Ninlaro. -Advised that the pt not lift anything heavy -Discussed pt labwork today, 02/25/18; blood  counts and chemistries are stable.  -Encouraged pt to apply sun screen as his skin will be more sensitive to sun exposure.  -The pt has no prohibitive toxicities from continuing treatment at this time.              2) CAD s/p prox LAD DES PCI on 07/31/2016 with ischemic cardiomyopathy ejection fraction of 25-35%. Rpt ECHO on 10/03/2016 shows improvement in EF to 55-60% with no RWMABN.  3) Macrocytic Anemia with moderate thrombocytopenia - due to MM and and Ninlaro. B12  Levels 1865 with adequate replacement.  5) Thrombocytopenia - due to MM and treatment --improved today 132k. --will monitor  6) B12 deficiency - Received [place] B12 daily while in the hospital. Plan -cont SL B12 1000m44mo daily  6) b/l Lower extremity swelling likely due to CHF an CKD. Much improved. Plan -continued adjustment and monitoring of diuretic therapy as per PCP and cardiology  .7) CAD s/p recent DES 8) h/o Afib with RVR on coumadin Some bradycardia with BB -continue mx per PCP and cardiology  9) Back pain -- multiple chronic compression fractures with some worsening -on bisphosphonates already -Ergoclaciferol 50k units weekly 25oh vit D levels adequate at 51.2   RTC with Dr KaleIrene Limbo4 weeks with labs Continue Pamidronate  q8weeks as per orders    All of the patients questions were answered with apparent satisfaction. The patient knows to call the clinic with any problems, questions or concerns.  The toal time spent in the appt was 20 minutes and more than 50% was on counseling and direct patient cares.   Sullivan Lone MD [place] AAHIVMS [facility] Hematology/Oncology Physician [facility]  (Office):       [phone] (Work cell):  [phone] (Fax):           [phone]  This document serves as a record of services personally performed by Sullivan Lone, MD. It was created on his behalf by Baldwin Jamaica, a trained medical scribe. The creation of this record is based on the scribe's personal  observations and the provider's statements to them.   .I have reviewed the above documentation for accuracy and completeness, and I agree with the above. Brunetta Genera MD MS

## 2018-02-25 ENCOUNTER — Telehealth: Payer: Self-pay | Admitting: Hematology

## 2018-02-25 ENCOUNTER — Encounter: Payer: Self-pay | Admitting: Hematology

## 2018-02-25 ENCOUNTER — Inpatient Hospital Stay: Payer: Medicare HMO

## 2018-02-25 ENCOUNTER — Inpatient Hospital Stay: Payer: Medicare HMO | Attending: Hematology | Admitting: Hematology

## 2018-02-25 VITALS — BP 114/59 | HR 62 | Temp 98.3°F | Resp 16 | Ht 70.0 in | Wt 189.5 lb

## 2018-02-25 DIAGNOSIS — G8929 Other chronic pain: Secondary | ICD-10-CM | POA: Insufficient documentation

## 2018-02-25 DIAGNOSIS — E7800 Pure hypercholesterolemia, unspecified: Secondary | ICD-10-CM | POA: Diagnosis not present

## 2018-02-25 DIAGNOSIS — R600 Localized edema: Secondary | ICD-10-CM | POA: Insufficient documentation

## 2018-02-25 DIAGNOSIS — D6959 Other secondary thrombocytopenia: Secondary | ICD-10-CM | POA: Insufficient documentation

## 2018-02-25 DIAGNOSIS — M8580 Other specified disorders of bone density and structure, unspecified site: Secondary | ICD-10-CM | POA: Insufficient documentation

## 2018-02-25 DIAGNOSIS — E538 Deficiency of other specified B group vitamins: Secondary | ICD-10-CM | POA: Diagnosis not present

## 2018-02-25 DIAGNOSIS — I4891 Unspecified atrial fibrillation: Secondary | ICD-10-CM | POA: Diagnosis not present

## 2018-02-25 DIAGNOSIS — K219 Gastro-esophageal reflux disease without esophagitis: Secondary | ICD-10-CM | POA: Insufficient documentation

## 2018-02-25 DIAGNOSIS — I10 Essential (primary) hypertension: Secondary | ICD-10-CM | POA: Insufficient documentation

## 2018-02-25 DIAGNOSIS — M545 Low back pain: Secondary | ICD-10-CM | POA: Diagnosis not present

## 2018-02-25 DIAGNOSIS — Z7901 Long term (current) use of anticoagulants: Secondary | ICD-10-CM | POA: Insufficient documentation

## 2018-02-25 DIAGNOSIS — C9000 Multiple myeloma not having achieved remission: Secondary | ICD-10-CM | POA: Insufficient documentation

## 2018-02-25 DIAGNOSIS — G4730 Sleep apnea, unspecified: Secondary | ICD-10-CM | POA: Insufficient documentation

## 2018-02-25 DIAGNOSIS — I2510 Atherosclerotic heart disease of native coronary artery without angina pectoris: Secondary | ICD-10-CM | POA: Insufficient documentation

## 2018-02-25 DIAGNOSIS — E119 Type 2 diabetes mellitus without complications: Secondary | ICD-10-CM | POA: Insufficient documentation

## 2018-02-25 DIAGNOSIS — Z794 Long term (current) use of insulin: Secondary | ICD-10-CM | POA: Insufficient documentation

## 2018-02-25 DIAGNOSIS — D649 Anemia, unspecified: Secondary | ICD-10-CM | POA: Diagnosis not present

## 2018-02-25 DIAGNOSIS — Z79899 Other long term (current) drug therapy: Secondary | ICD-10-CM | POA: Insufficient documentation

## 2018-02-25 LAB — CBC WITH DIFFERENTIAL (CANCER CENTER ONLY)
Basophils Absolute: 0 10*3/uL (ref 0.0–0.1)
Basophils Relative: 1 %
EOS ABS: 0.3 10*3/uL (ref 0.0–0.5)
EOS PCT: 7 %
HCT: 33.5 % — ABNORMAL LOW (ref 38.4–49.9)
HEMOGLOBIN: 10.5 g/dL — AB (ref 13.0–17.1)
LYMPHS ABS: 0.6 10*3/uL — AB (ref 0.9–3.3)
LYMPHS PCT: 13 %
MCH: 27.3 pg (ref 27.2–33.4)
MCHC: 31.3 g/dL — ABNORMAL LOW (ref 32.0–36.0)
MCV: 87 fL (ref 79.3–98.0)
Monocytes Absolute: 0.5 10*3/uL (ref 0.1–0.9)
Monocytes Relative: 10 %
Neutro Abs: 3.3 10*3/uL (ref 1.5–6.5)
Neutrophils Relative %: 69 %
Platelet Count: 132 10*3/uL — ABNORMAL LOW (ref 140–400)
RBC: 3.85 MIL/uL — AB (ref 4.20–5.82)
RDW: 18.9 % — ABNORMAL HIGH (ref 11.0–14.6)
WBC Count: 4.8 10*3/uL (ref 4.0–10.3)

## 2018-02-25 LAB — CMP (CANCER CENTER ONLY)
ALT: 25 U/L (ref 0–55)
AST: 29 U/L (ref 5–34)
Albumin: 3.7 g/dL (ref 3.5–5.0)
Alkaline Phosphatase: 115 U/L (ref 40–150)
Anion gap: 5 (ref 3–11)
BUN: 33 mg/dL — ABNORMAL HIGH (ref 7–26)
CHLORIDE: 111 mmol/L — AB (ref 98–109)
CO2: 28 mmol/L (ref 22–29)
CREATININE: 1.59 mg/dL — AB (ref 0.70–1.30)
Calcium: 9.5 mg/dL (ref 8.4–10.4)
GFR, EST AFRICAN AMERICAN: 49 mL/min — AB (ref >60)
GFR, Estimated: 42 mL/min — ABNORMAL LOW (ref >60)
Glucose, Bld: 102 mg/dL (ref 70–140)
POTASSIUM: 4.4 mmol/L (ref 3.5–5.1)
SODIUM: 144 mmol/L (ref 136–145)
Total Bilirubin: 0.7 mg/dL (ref 0.2–1.2)
Total Protein: 6.8 g/dL (ref 6.4–8.3)

## 2018-02-25 LAB — RETICULOCYTES
RBC.: 3.85 MIL/uL — ABNORMAL LOW (ref 4.20–5.82)
RETIC CT PCT: 0.9 % (ref 0.8–1.8)
Retic Count, Absolute: 34.7 10*3/uL — ABNORMAL LOW (ref 34.8–93.9)

## 2018-02-25 NOTE — Telephone Encounter (Signed)
Scheduled appt per 5/9 los - Gave patient AVS and calender per los.  

## 2018-02-25 NOTE — Patient Instructions (Signed)
Thank you for choosing [facility] to provide your oncology and hematology care.  To afford each patient quality time with our providers, please arrive 30 minutes before your scheduled appointment time.  If you arrive late for your appointment, you may be asked to reschedule.  We strive to give you quality time with our providers, and arriving late affects you and other patients whose appointments are after yours.   If you are a no show for multiple scheduled visits, you may be dismissed from the clinic at the providers discretion.    Again, thank you for choosing [facility], our hope is that these requests will decrease the amount of time that you wait before being seen by our physicians.  ______________________________________________________________________  Should you have questions after your visit to the [facility], please contact our office at [phone] between the hours of 8:30 and 4:30 p.m.    Voicemails left after 4:30p.m will not be returned until the following business day.    For prescription refill requests, please have your pharmacy contact us directly.  Please also try to allow 48 hours for prescription requests.    Please contact the scheduling department for questions regarding scheduling.  For scheduling of procedures such as PET scans, CT scans, MRI, Ultrasound, etc please contact central scheduling at [phone].    Resources For Cancer Patients and Caregivers:   Oncolink.org:  A wonderful resource for patients and healthcare providers for information regarding your disease, ways to tract your treatment, what to expect, etc.     American Cancer Society:  800-227-2345  Can help patients locate various types of support and financial assistance  Cancer Care: 1-800-813-HOPE (4673) Provides financial assistance, online support groups, medication/co-pay assistance.    [place] DSS:  336-641-3447 Where to apply for food  stamps, Medicaid, and utility assistance  Medicare Rights Center: 800-333-4114 Helps people with Medicare understand their rights and benefits, navigate the Medicare system, and secure the quality healthcare they deserve  SCAT: 336-333-6589 [place] [employer]'s shared-ride transportation service for eligible riders who have a disability that prevents them from riding the fixed route bus.    For additional information on assistance programs please contact our social worker:   Grier Hock/Abigail Elmore:  [phone]            

## 2018-02-26 LAB — KAPPA/LAMBDA LIGHT CHAINS
KAPPA, LAMDA LIGHT CHAIN RATIO: 7.28 — AB (ref 0.26–1.65)
Kappa free light chain: 75.7 mg/L — ABNORMAL HIGH (ref 3.3–19.4)
LAMDA FREE LIGHT CHAINS: 10.4 mg/L (ref 5.7–26.3)

## 2018-02-26 LAB — VITAMIN D 25 HYDROXY (VIT D DEFICIENCY, FRACTURES): Vit D, 25-Hydroxy: 51.2 ng/mL (ref 30.0–100.0)

## 2018-02-27 ENCOUNTER — Other Ambulatory Visit: Payer: Self-pay | Admitting: Hematology

## 2018-02-27 DIAGNOSIS — C9000 Multiple myeloma not having achieved remission: Secondary | ICD-10-CM

## 2018-03-02 LAB — MULTIPLE MYELOMA PANEL, SERUM
ALBUMIN/GLOB SERPL: 1.2 (ref 0.7–1.7)
ALPHA 1: 0.2 g/dL (ref 0.0–0.4)
ALPHA2 GLOB SERPL ELPH-MCNC: 0.7 g/dL (ref 0.4–1.0)
Albumin SerPl Elph-Mcnc: 3.2 g/dL (ref 2.9–4.4)
B-Globulin SerPl Elph-Mcnc: 0.9 g/dL (ref 0.7–1.3)
Gamma Glob SerPl Elph-Mcnc: 1 g/dL (ref 0.4–1.8)
Globulin, Total: 2.9 g/dL (ref 2.2–3.9)
IGG (IMMUNOGLOBIN G), SERUM: 1067 mg/dL (ref 700–1600)
IGM (IMMUNOGLOBULIN M), SRM: 31 mg/dL (ref 20–172)
IgA: 50 mg/dL — ABNORMAL LOW (ref 61–437)
Total Protein ELP: 6.1 g/dL (ref 6.0–8.5)

## 2018-03-04 ENCOUNTER — Other Ambulatory Visit: Payer: Self-pay | Admitting: Hematology

## 2018-03-04 DIAGNOSIS — C9000 Multiple myeloma not having achieved remission: Secondary | ICD-10-CM

## 2018-03-12 ENCOUNTER — Other Ambulatory Visit: Payer: Self-pay | Admitting: Hematology

## 2018-03-12 DIAGNOSIS — C9000 Multiple myeloma not having achieved remission: Secondary | ICD-10-CM

## 2018-03-18 MED FILL — NINLARO 3 MG CAPSULE: 3 | 28 days supply | Qty: 3 | Fill #0

## 2018-03-19 ENCOUNTER — Encounter: Payer: Self-pay | Admitting: Hematology

## 2018-03-19 NOTE — Progress Notes (Signed)
Received authorization from [facility] for J2430 Pamidronate. Authorization valid from 03/25/2018-11/20/2018. Authorization number is [id number]. Good for Qty ! With 7 refills. Scanned into patient chart
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
## 2018-03-25 ENCOUNTER — Inpatient Hospital Stay: Payer: Medicare HMO

## 2018-03-25 ENCOUNTER — Inpatient Hospital Stay: Payer: Medicare HMO | Attending: Hematology

## 2018-03-25 ENCOUNTER — Inpatient Hospital Stay: Payer: Medicare HMO | Admitting: Hematology

## 2018-03-25 VITALS — BP 91/63 | HR 65 | Temp 97.6°F | Resp 18 | Wt 193.0 lb

## 2018-03-25 DIAGNOSIS — R5383 Other fatigue: Secondary | ICD-10-CM | POA: Diagnosis not present

## 2018-03-25 DIAGNOSIS — E538 Deficiency of other specified B group vitamins: Secondary | ICD-10-CM

## 2018-03-25 DIAGNOSIS — D6959 Other secondary thrombocytopenia: Secondary | ICD-10-CM | POA: Insufficient documentation

## 2018-03-25 DIAGNOSIS — K219 Gastro-esophageal reflux disease without esophagitis: Secondary | ICD-10-CM | POA: Diagnosis not present

## 2018-03-25 DIAGNOSIS — I2510 Atherosclerotic heart disease of native coronary artery without angina pectoris: Secondary | ICD-10-CM

## 2018-03-25 DIAGNOSIS — E785 Hyperlipidemia, unspecified: Secondary | ICD-10-CM | POA: Diagnosis not present

## 2018-03-25 DIAGNOSIS — Z7901 Long term (current) use of anticoagulants: Secondary | ICD-10-CM | POA: Insufficient documentation

## 2018-03-25 DIAGNOSIS — M549 Dorsalgia, unspecified: Secondary | ICD-10-CM | POA: Diagnosis not present

## 2018-03-25 DIAGNOSIS — D649 Anemia, unspecified: Secondary | ICD-10-CM

## 2018-03-25 DIAGNOSIS — Z794 Long term (current) use of insulin: Secondary | ICD-10-CM | POA: Diagnosis not present

## 2018-03-25 DIAGNOSIS — I4891 Unspecified atrial fibrillation: Secondary | ICD-10-CM | POA: Insufficient documentation

## 2018-03-25 DIAGNOSIS — C9000 Multiple myeloma not having achieved remission: Secondary | ICD-10-CM | POA: Diagnosis present

## 2018-03-25 DIAGNOSIS — I10 Essential (primary) hypertension: Secondary | ICD-10-CM | POA: Diagnosis not present

## 2018-03-25 DIAGNOSIS — E119 Type 2 diabetes mellitus without complications: Secondary | ICD-10-CM

## 2018-03-25 DIAGNOSIS — Z7983 Long term (current) use of bisphosphonates: Secondary | ICD-10-CM

## 2018-03-25 DIAGNOSIS — Z79899 Other long term (current) drug therapy: Secondary | ICD-10-CM

## 2018-03-25 DIAGNOSIS — D630 Anemia in neoplastic disease: Secondary | ICD-10-CM

## 2018-03-25 DIAGNOSIS — R1030 Lower abdominal pain, unspecified: Secondary | ICD-10-CM | POA: Diagnosis not present

## 2018-03-25 DIAGNOSIS — G629 Polyneuropathy, unspecified: Secondary | ICD-10-CM | POA: Diagnosis not present

## 2018-03-25 DIAGNOSIS — Z7189 Other specified counseling: Secondary | ICD-10-CM

## 2018-03-25 LAB — CBC WITH DIFFERENTIAL/PLATELET
BASOS ABS: 0 10*3/uL (ref 0.0–0.1)
BASOS PCT: 1 %
EOS ABS: 0.3 10*3/uL (ref 0.0–0.5)
EOS PCT: 6 %
HCT: 33.8 % — ABNORMAL LOW (ref 38.4–49.9)
Hemoglobin: 10.6 g/dL — ABNORMAL LOW (ref 13.0–17.1)
Lymphocytes Relative: 14 %
Lymphs Abs: 0.8 10*3/uL — ABNORMAL LOW (ref 0.9–3.3)
MCH: 27.4 pg (ref 27.2–33.4)
MCHC: 31.4 g/dL — ABNORMAL LOW (ref 32.0–36.0)
MCV: 87.3 fL (ref 79.3–98.0)
MONO ABS: 0.6 10*3/uL (ref 0.1–0.9)
Monocytes Relative: 10 %
Neutro Abs: 3.7 10*3/uL (ref 1.5–6.5)
Neutrophils Relative %: 69 %
PLATELETS: 125 10*3/uL — AB (ref 140–400)
RBC: 3.87 MIL/uL — ABNORMAL LOW (ref 4.20–5.82)
RDW: 18.5 % — AB (ref 11.0–14.6)
WBC: 5.4 10*3/uL (ref 4.0–10.3)

## 2018-03-25 LAB — CMP (CANCER CENTER ONLY)
ALBUMIN: 3.8 g/dL (ref 3.5–5.0)
ALT: 12 U/L (ref 0–55)
AST: 22 U/L (ref 5–34)
Alkaline Phosphatase: 87 U/L (ref 40–150)
Anion gap: 8 (ref 3–11)
BILIRUBIN TOTAL: 0.7 mg/dL (ref 0.2–1.2)
BUN: 40 mg/dL — AB (ref 7–26)
CO2: 23 mmol/L (ref 22–29)
Calcium: 9.1 mg/dL (ref 8.4–10.4)
Chloride: 110 mmol/L — ABNORMAL HIGH (ref 98–109)
Creatinine: 1.7 mg/dL — ABNORMAL HIGH (ref 0.70–1.30)
GFR, Est AFR Am: 45 mL/min — ABNORMAL LOW (ref >60)
GFR, Estimated: 39 mL/min — ABNORMAL LOW (ref >60)
GLUCOSE: 112 mg/dL (ref 70–140)
POTASSIUM: 4.4 mmol/L (ref 3.5–5.1)
Sodium: 141 mmol/L (ref 136–145)
TOTAL PROTEIN: 6.5 g/dL (ref 6.4–8.3)

## 2018-03-25 MED ORDER — ALTEPLASE 2 MG IJ SOLR
2.0000 mg | Freq: Once | INTRAMUSCULAR | Status: DC | PRN
  Filled 2018-03-25: qty 2

## 2018-03-25 MED ORDER — SODIUM CHLORIDE 0.9% FLUSH
10.0000 mL | INTRAVENOUS | Status: DC | PRN
  Filled 2018-03-25: qty 10

## 2018-03-25 MED ORDER — HEPARIN SOD (PORK) LOCK FLUSH 100 UNIT/ML IV SOLN
500.0000 [IU] | Freq: Once | INTRAVENOUS | Status: DC | PRN
  Filled 2018-03-25: qty 5

## 2018-03-25 MED ORDER — SODIUM CHLORIDE 0.9 % IV SOLN
60.0000 mg | Freq: Once | INTRAVENOUS | Status: AC
  Administered 2018-03-25: 60 mg via INTRAVENOUS
  Filled 2018-03-25: qty 10

## 2018-03-25 MED ORDER — SODIUM CHLORIDE 0.9% FLUSH
3.0000 mL | Freq: Once | INTRAVENOUS | Status: DC | PRN
  Filled 2018-03-25: qty 10

## 2018-03-25 MED ORDER — HEPARIN SOD (PORK) LOCK FLUSH 100 UNIT/ML IV SOLN
250.0000 [IU] | Freq: Once | INTRAVENOUS | Status: DC | PRN
  Filled 2018-03-25: qty 5

## 2018-03-25 NOTE — Patient Instructions (Signed)
Pamidronate (Aredia) injection What is this medicine? PAMIDRONATE (pa mi DROE nate) slows calcium loss from bones. It is used to treat high calcium blood levels from cancer or Paget's disease. It is also used to treat bone pain and prevent fractures from certain cancers that have spread to the bone. This medicine may be used for other purposes; ask your health care provider or pharmacist if you have questions. COMMON BRAND NAME(S): Aredia What should I tell my health care provider before I take this medicine? They need to know if you have any of these conditions: -aspirin-sensitive asthma -dental disease -kidney disease -an unusual or allergic reaction to pamidronate, other medicines, foods, dyes, or preservatives -pregnant or trying to get pregnant -breast-feeding How should I use this medicine? This medicine is for infusion into a vein. It is given by a health care professional in a hospital or clinic setting. Talk to your pediatrician regarding the use of this medicine in children. This medicine is not approved for use in children. Overdosage: If you think you have taken too much of this medicine contact a poison control center or emergency room at once. NOTE: This medicine is only for you. Do not share this medicine with others. What if I miss a dose? This does not apply. What may interact with this medicine? -certain antibiotics given by injection -medicines for inflammation or pain like ibuprofen, naproxen -some diuretics like bumetanide, furosemide -cyclosporine -parathyroid hormone -tacrolimus -teriparatide -thalidomide This list may not describe all possible interactions. Give your health care provider a list of all the medicines, herbs, non-prescription drugs, or dietary supplements you use. Also tell them if you smoke, drink alcohol, or use illegal drugs. Some items may interact with your medicine. What should I watch for while using this medicine? Visit your doctor or  health care professional for regular checkups. It may be some time before you see the benefit from this medicine. Do not stop taking your medicine unless your doctor tells you to. Your doctor may order blood tests or other tests to see how you are doing. Women should inform their doctor if they wish to become pregnant or think they might be pregnant. There is a potential for serious side effects to an unborn child. Talk to your health care professional or pharmacist for more information. You should make sure that you get enough calcium and vitamin D while you are taking this medicine. Discuss the foods you eat and the vitamins you take with your health care professional. Some people who take this medicine have severe bone, joint, and/or muscle pain. This medicine may also increase your risk for a broken thigh bone. Tell your doctor right away if you have pain in your upper leg or groin. Tell your doctor if you have any pain that does not go away or that gets worse. What side effects may I notice from receiving this medicine? Side effects that you should report to your doctor or health care professional as soon as possible: -allergic reactions like skin rash, itching or hives, swelling of the face, lips, or tongue -black or tarry stools -changes in vision -eye inflammation, pain -high blood pressure -jaw pain, especially burning or cramping -muscle weakness -numb, tingling pain -swelling of feet or hands -trouble passing urine or change in the amount of urine -unable to move easily Side effects that usually do not require medical attention (report to your doctor or health care professional if they continue or are bothersome): -bone, joint, or muscle pain -constipation -dizzy,   drowsy -fever -headache -loss of appetite -nausea, vomiting -pain at site where injected This list may not describe all possible side effects. Call your doctor for medical advice about side effects. You may report side  effects to FDA at 1-800-FDA-1088. Where should I keep my medicine? This drug is given in a hospital or clinic and will not be stored at home. NOTE: This sheet is a summary. It may not cover all possible information. If you have questions about this medicine, talk to your doctor, pharmacist, or health care provider.  2018 Elsevier/Gold Standard (2011-04-04 08:49:49)  

## 2018-03-26 ENCOUNTER — Telehealth: Payer: Self-pay

## 2018-03-26 LAB — KAPPA/LAMBDA LIGHT CHAINS
KAPPA FREE LGHT CHN: 68 mg/L — AB (ref 3.3–19.4)
KAPPA, LAMDA LIGHT CHAIN RATIO: 6.6 — AB (ref 0.26–1.65)
LAMDA FREE LIGHT CHAINS: 10.3 mg/L (ref 5.7–26.3)

## 2018-03-26 NOTE — Telephone Encounter (Signed)
Left a detailed voice mail of upcoming appointment. Per 6/6 los also will be mailing a calender and letter of these appointment.

## 2018-04-15 MED FILL — NINLARO 3 MG CAPSULE: 3 | 28 days supply | Qty: 3 | Fill #1

## 2018-05-10 ENCOUNTER — Other Ambulatory Visit: Payer: Self-pay | Admitting: Hematology

## 2018-05-10 DIAGNOSIS — C9000 Multiple myeloma not having achieved remission: Secondary | ICD-10-CM

## 2018-05-12 MED FILL — NINLARO 3 MG CAPSULE: 3 | 28 days supply | Qty: 3 | Fill #0

## 2018-05-19 ENCOUNTER — Other Ambulatory Visit: Payer: Self-pay

## 2018-05-19 ENCOUNTER — Emergency Department (HOSPITAL_COMMUNITY): Payer: Medicare HMO

## 2018-05-19 ENCOUNTER — Inpatient Hospital Stay (HOSPITAL_COMMUNITY)
Admission: EM | Admit: 2018-05-19 | Discharge: 2018-05-27 | DRG: 513 | Disposition: A | Discharge status: Home Health | Payer: Medicare HMO | Attending: Internal Medicine | Admitting: Internal Medicine

## 2018-05-19 DIAGNOSIS — S32028A Other fracture of second lumbar vertebra, initial encounter for closed fracture: Secondary | ICD-10-CM | POA: Diagnosis present

## 2018-05-19 DIAGNOSIS — D631 Anemia in chronic kidney disease: Secondary | ICD-10-CM | POA: Diagnosis present

## 2018-05-19 DIAGNOSIS — I10 Essential (primary) hypertension: Secondary | ICD-10-CM | POA: Diagnosis not present

## 2018-05-19 DIAGNOSIS — Z7902 Long term (current) use of antithrombotics/antiplatelets: Secondary | ICD-10-CM

## 2018-05-19 DIAGNOSIS — M79645 Pain in left finger(s): Secondary | ICD-10-CM

## 2018-05-19 DIAGNOSIS — S2243XA Multiple fractures of ribs, bilateral, initial encounter for closed fracture: Secondary | ICD-10-CM | POA: Diagnosis present

## 2018-05-19 DIAGNOSIS — S42001A Fracture of unspecified part of right clavicle, initial encounter for closed fracture: Secondary | ICD-10-CM | POA: Diagnosis not present

## 2018-05-19 DIAGNOSIS — E785 Hyperlipidemia, unspecified: Secondary | ICD-10-CM | POA: Diagnosis present

## 2018-05-19 DIAGNOSIS — S42101A Fracture of unspecified part of scapula, right shoulder, initial encounter for closed fracture: Secondary | ICD-10-CM | POA: Diagnosis not present

## 2018-05-19 DIAGNOSIS — R0902 Hypoxemia: Secondary | ICD-10-CM | POA: Diagnosis not present

## 2018-05-19 DIAGNOSIS — S129XXA Fracture of neck, unspecified, initial encounter: Secondary | ICD-10-CM

## 2018-05-19 DIAGNOSIS — S2242XA Multiple fractures of ribs, left side, initial encounter for closed fracture: Secondary | ICD-10-CM

## 2018-05-19 DIAGNOSIS — S2239XA Fracture of one rib, unspecified side, initial encounter for closed fracture: Secondary | ICD-10-CM

## 2018-05-19 DIAGNOSIS — W19XXXA Unspecified fall, initial encounter: Secondary | ICD-10-CM | POA: Diagnosis present

## 2018-05-19 DIAGNOSIS — S42012A Anterior displaced fracture of sternal end of left clavicle, initial encounter for closed fracture: Secondary | ICD-10-CM | POA: Diagnosis present

## 2018-05-19 DIAGNOSIS — S42002A Fracture of unspecified part of left clavicle, initial encounter for closed fracture: Secondary | ICD-10-CM

## 2018-05-19 DIAGNOSIS — S2222XA Fracture of body of sternum, initial encounter for closed fracture: Secondary | ICD-10-CM | POA: Diagnosis present

## 2018-05-19 DIAGNOSIS — S62321A Displaced fracture of shaft of second metacarpal bone, left hand, initial encounter for closed fracture: Secondary | ICD-10-CM | POA: Diagnosis present

## 2018-05-19 DIAGNOSIS — S32038A Other fracture of third lumbar vertebra, initial encounter for closed fracture: Secondary | ICD-10-CM | POA: Diagnosis present

## 2018-05-19 DIAGNOSIS — Z794 Long term (current) use of insulin: Secondary | ICD-10-CM

## 2018-05-19 DIAGNOSIS — I129 Hypertensive chronic kidney disease with stage 1 through stage 4 chronic kidney disease, or unspecified chronic kidney disease: Secondary | ICD-10-CM | POA: Diagnosis present

## 2018-05-19 DIAGNOSIS — Z955 Presence of coronary angioplasty implant and graft: Secondary | ICD-10-CM

## 2018-05-19 DIAGNOSIS — E7800 Pure hypercholesterolemia, unspecified: Secondary | ICD-10-CM | POA: Diagnosis present

## 2018-05-19 DIAGNOSIS — S42009A Fracture of unspecified part of unspecified clavicle, initial encounter for closed fracture: Secondary | ICD-10-CM

## 2018-05-19 DIAGNOSIS — Z6828 Body mass index (BMI) 28.0-28.9, adult: Secondary | ICD-10-CM

## 2018-05-19 DIAGNOSIS — G4730 Sleep apnea, unspecified: Secondary | ICD-10-CM | POA: Diagnosis present

## 2018-05-19 DIAGNOSIS — K219 Gastro-esophageal reflux disease without esophagitis: Secondary | ICD-10-CM | POA: Diagnosis present

## 2018-05-19 DIAGNOSIS — N183 Chronic kidney disease, stage 3 (moderate): Secondary | ICD-10-CM | POA: Diagnosis present

## 2018-05-19 DIAGNOSIS — I482 Chronic atrial fibrillation, unspecified: Secondary | ICD-10-CM | POA: Diagnosis present

## 2018-05-19 DIAGNOSIS — T07XXXA Unspecified multiple injuries, initial encounter: Secondary | ICD-10-CM

## 2018-05-19 DIAGNOSIS — C9000 Multiple myeloma not having achieved remission: Secondary | ICD-10-CM | POA: Diagnosis present

## 2018-05-19 DIAGNOSIS — D630 Anemia in neoplastic disease: Secondary | ICD-10-CM | POA: Diagnosis present

## 2018-05-19 DIAGNOSIS — I2510 Atherosclerotic heart disease of native coronary artery without angina pectoris: Secondary | ICD-10-CM | POA: Diagnosis present

## 2018-05-19 DIAGNOSIS — Z79899 Other long term (current) drug therapy: Secondary | ICD-10-CM

## 2018-05-19 DIAGNOSIS — S8002XA Contusion of left knee, initial encounter: Secondary | ICD-10-CM | POA: Diagnosis present

## 2018-05-19 DIAGNOSIS — I959 Hypotension, unspecified: Secondary | ICD-10-CM | POA: Diagnosis not present

## 2018-05-19 DIAGNOSIS — D649 Anemia, unspecified: Secondary | ICD-10-CM | POA: Diagnosis not present

## 2018-05-19 DIAGNOSIS — Z419 Encounter for procedure for purposes other than remedying health state, unspecified: Secondary | ICD-10-CM

## 2018-05-19 DIAGNOSIS — N401 Enlarged prostate with lower urinary tract symptoms: Secondary | ICD-10-CM | POA: Diagnosis present

## 2018-05-19 DIAGNOSIS — E669 Obesity, unspecified: Secondary | ICD-10-CM | POA: Diagnosis present

## 2018-05-19 DIAGNOSIS — Z9861 Coronary angioplasty status: Secondary | ICD-10-CM

## 2018-05-19 DIAGNOSIS — D62 Acute posthemorrhagic anemia: Secondary | ICD-10-CM | POA: Diagnosis not present

## 2018-05-19 DIAGNOSIS — S42021A Displaced fracture of shaft of right clavicle, initial encounter for closed fracture: Secondary | ICD-10-CM | POA: Diagnosis present

## 2018-05-19 DIAGNOSIS — M25562 Pain in left knee: Secondary | ICD-10-CM

## 2018-05-19 DIAGNOSIS — N179 Acute kidney failure, unspecified: Secondary | ICD-10-CM | POA: Diagnosis not present

## 2018-05-19 DIAGNOSIS — R338 Other retention of urine: Secondary | ICD-10-CM | POA: Diagnosis present

## 2018-05-19 DIAGNOSIS — S42009D Fracture of unspecified part of unspecified clavicle, subsequent encounter for fracture with routine healing: Secondary | ICD-10-CM | POA: Diagnosis not present

## 2018-05-19 DIAGNOSIS — D6959 Other secondary thrombocytopenia: Secondary | ICD-10-CM | POA: Diagnosis present

## 2018-05-19 DIAGNOSIS — S42111A Displaced fracture of body of scapula, right shoulder, initial encounter for closed fracture: Secondary | ICD-10-CM | POA: Diagnosis present

## 2018-05-19 DIAGNOSIS — E1122 Type 2 diabetes mellitus with diabetic chronic kidney disease: Secondary | ICD-10-CM | POA: Diagnosis present

## 2018-05-19 DIAGNOSIS — I255 Ischemic cardiomyopathy: Secondary | ICD-10-CM | POA: Diagnosis present

## 2018-05-19 DIAGNOSIS — Z8249 Family history of ischemic heart disease and other diseases of the circulatory system: Secondary | ICD-10-CM

## 2018-05-19 DIAGNOSIS — T148XXA Other injury of unspecified body region, initial encounter: Secondary | ICD-10-CM

## 2018-05-19 DIAGNOSIS — J9601 Acute respiratory failure with hypoxia: Secondary | ICD-10-CM | POA: Diagnosis not present

## 2018-05-19 DIAGNOSIS — Z7901 Long term (current) use of anticoagulants: Secondary | ICD-10-CM

## 2018-05-19 DIAGNOSIS — S2220XA Unspecified fracture of sternum, initial encounter for closed fracture: Secondary | ICD-10-CM

## 2018-05-19 DIAGNOSIS — E875 Hyperkalemia: Secondary | ICD-10-CM | POA: Diagnosis present

## 2018-05-19 LAB — CBC WITH DIFFERENTIAL/PLATELET
ABS IMMATURE GRANULOCYTES: 0.1 10*3/uL (ref 0.0–0.1)
BASOS ABS: 0 10*3/uL (ref 0.0–0.1)
BASOS PCT: 0 %
Eosinophils Absolute: 0.2 10*3/uL (ref 0.0–0.7)
Eosinophils Relative: 3 %
HCT: 35.9 % — ABNORMAL LOW (ref 39.0–52.0)
HEMOGLOBIN: 11.3 g/dL — AB (ref 13.0–17.0)
IMMATURE GRANULOCYTES: 1 %
Lymphocytes Relative: 10 %
Lymphs Abs: 0.9 10*3/uL (ref 0.7–4.0)
MCH: 28.8 pg (ref 26.0–34.0)
MCHC: 31.5 g/dL (ref 30.0–36.0)
MCV: 91.3 fL (ref 78.0–100.0)
MONO ABS: 0.6 10*3/uL (ref 0.1–1.0)
Monocytes Relative: 6 %
NEUTROS ABS: 7.3 10*3/uL (ref 1.7–7.7)
NEUTROS PCT: 80 %
PLATELETS: 195 10*3/uL (ref 150–400)
RBC: 3.93 MIL/uL — AB (ref 4.22–5.81)
RDW: 17.8 % — ABNORMAL HIGH (ref 11.5–15.5)
WBC: 9.1 10*3/uL (ref 4.0–10.5)

## 2018-05-19 LAB — COMPREHENSIVE METABOLIC PANEL
ALBUMIN: 3.5 g/dL (ref 3.5–5.0)
ALK PHOS: 78 U/L (ref 38–126)
ALT: 23 U/L (ref 0–44)
AST: 38 U/L (ref 15–41)
Anion gap: 11 (ref 5–15)
BUN: 39 mg/dL — ABNORMAL HIGH (ref 8–23)
CALCIUM: 9.1 mg/dL (ref 8.9–10.3)
CHLORIDE: 108 mmol/L (ref 98–111)
CO2: 20 mmol/L — AB (ref 22–32)
CREATININE: 1.68 mg/dL — AB (ref 0.61–1.24)
GFR calc non Af Amer: 40 mL/min — ABNORMAL LOW (ref >60)
GFR, EST AFRICAN AMERICAN: 46 mL/min — AB (ref >60)
GLUCOSE: 138 mg/dL — AB (ref 70–99)
Potassium: 4.6 mmol/L (ref 3.5–5.1)
SODIUM: 139 mmol/L (ref 135–145)
Total Bilirubin: 1 mg/dL (ref 0.3–1.2)
Total Protein: 6.2 g/dL — ABNORMAL LOW (ref 6.5–8.1)

## 2018-05-19 LAB — PROTIME-INR
INR: 1.54
Prothrombin Time: 18.3 seconds — ABNORMAL HIGH (ref 11.4–15.2)

## 2018-05-19 MED ORDER — MORPHINE SULFATE (PF) 4 MG/ML IV SOLN
4.0000 mg | Freq: Once | INTRAVENOUS | Status: AC
  Administered 2018-05-19: 4 mg via INTRAVENOUS
  Filled 2018-05-19: qty 1

## 2018-05-19 MED ORDER — IOHEXOL 300 MG/ML  SOLN
80.0000 mL | Freq: Once | INTRAMUSCULAR | Status: AC | PRN
  Administered 2018-05-19: 80 mL via INTRAVENOUS

## 2018-05-19 MED ORDER — MORPHINE SULFATE (PF) 4 MG/ML IV SOLN
4.0000 mg | INTRAVENOUS | Status: DC | PRN
  Administered 2018-05-19 – 2018-05-20 (×4): 4 mg via INTRAVENOUS
  Filled 2018-05-19 (×4): qty 1
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
## 2018-05-19 NOTE — ED Notes (Signed)
Patient transported to CT 
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
## 2018-05-20 ENCOUNTER — Encounter (HOSPITAL_COMMUNITY): Payer: Self-pay | Admitting: Internal Medicine

## 2018-05-20 ENCOUNTER — Ambulatory Visit: Payer: Medicare HMO

## 2018-05-20 ENCOUNTER — Inpatient Hospital Stay (HOSPITAL_COMMUNITY): Payer: Medicare HMO

## 2018-05-20 DIAGNOSIS — S42101A Fracture of unspecified part of scapula, right shoulder, initial encounter for closed fracture: Secondary | ICD-10-CM

## 2018-05-20 DIAGNOSIS — S42002A Fracture of unspecified part of left clavicle, initial encounter for closed fracture: Secondary | ICD-10-CM

## 2018-05-20 DIAGNOSIS — S129XXA Fracture of neck, unspecified, initial encounter: Secondary | ICD-10-CM

## 2018-05-20 DIAGNOSIS — S2242XA Multiple fractures of ribs, left side, initial encounter for closed fracture: Secondary | ICD-10-CM

## 2018-05-20 DIAGNOSIS — S42001A Fracture of unspecified part of right clavicle, initial encounter for closed fracture: Secondary | ICD-10-CM

## 2018-05-20 DIAGNOSIS — S2220XA Unspecified fracture of sternum, initial encounter for closed fracture: Secondary | ICD-10-CM

## 2018-05-20 DIAGNOSIS — T07XXXA Unspecified multiple injuries, initial encounter: Secondary | ICD-10-CM | POA: Diagnosis present

## 2018-05-20 LAB — GLUCOSE, CAPILLARY
GLUCOSE-CAPILLARY: 125 mg/dL — AB (ref 70–99)
GLUCOSE-CAPILLARY: 141 mg/dL — AB (ref 70–99)
GLUCOSE-CAPILLARY: 150 mg/dL — AB (ref 70–99)
Glucose-Capillary: 114 mg/dL — ABNORMAL HIGH (ref 70–99)
Glucose-Capillary: 125 mg/dL — ABNORMAL HIGH (ref 70–99)
Glucose-Capillary: 145 mg/dL — ABNORMAL HIGH (ref 70–99)

## 2018-05-20 LAB — CBC
HEMATOCRIT: 32.6 % — AB (ref 39.0–52.0)
Hemoglobin: 10.2 g/dL — ABNORMAL LOW (ref 13.0–17.0)
MCH: 28.7 pg (ref 26.0–34.0)
MCHC: 31.3 g/dL (ref 30.0–36.0)
MCV: 91.6 fL (ref 78.0–100.0)
PLATELETS: 163 10*3/uL (ref 150–400)
RBC: 3.56 MIL/uL — ABNORMAL LOW (ref 4.22–5.81)
RDW: 17.6 % — AB (ref 11.5–15.5)
WBC: 8.9 10*3/uL (ref 4.0–10.5)

## 2018-05-20 LAB — BASIC METABOLIC PANEL
Anion gap: 8 (ref 5–15)
BUN: 46 mg/dL — ABNORMAL HIGH (ref 8–23)
CHLORIDE: 109 mmol/L (ref 98–111)
CO2: 24 mmol/L (ref 22–32)
Calcium: 9.1 mg/dL (ref 8.9–10.3)
Creatinine, Ser: 2.01 mg/dL — ABNORMAL HIGH (ref 0.61–1.24)
GFR calc Af Amer: 37 mL/min — ABNORMAL LOW (ref >60)
GFR calc non Af Amer: 32 mL/min — ABNORMAL LOW (ref >60)
Glucose, Bld: 130 mg/dL — ABNORMAL HIGH (ref 70–99)
POTASSIUM: 5.3 mmol/L — AB (ref 3.5–5.1)
SODIUM: 141 mmol/L (ref 135–145)

## 2018-05-20 LAB — HIV ANTIBODY (ROUTINE TESTING W REFLEX): HIV Screen 4th Generation wRfx: NONREACTIVE

## 2018-05-20 LAB — MRSA PCR SCREENING: MRSA BY PCR: NEGATIVE

## 2018-05-20 MED ORDER — ONDANSETRON HCL 4 MG/2ML IJ SOLN: 4.0000 mg | Freq: Four times a day (QID) | INTRAMUSCULAR | Status: DC | PRN

## 2018-05-20 MED ORDER — ACETAMINOPHEN 325 MG PO TABS
650.0000 mg | ORAL_TABLET | ORAL | Status: DC
  Administered 2018-05-20 – 2018-05-27 (×36): 650 mg via ORAL
  Filled 2018-05-20 (×36): qty 2

## 2018-05-20 MED ORDER — CEFAZOLIN SODIUM-DEXTROSE 2-4 GM/100ML-% IV SOLN: 2.0000 g | INTRAVENOUS | Status: DC

## 2018-05-20 MED ORDER — CARVEDILOL 12.5 MG PO TABS
12.5000 mg | ORAL_TABLET | Freq: Two times a day (BID) | ORAL | Status: DC
  Administered 2018-05-20 – 2018-05-27 (×14): 12.5 mg via ORAL
  Filled 2018-05-20 (×14): qty 1

## 2018-05-20 MED ORDER — CEFAZOLIN SODIUM-DEXTROSE 2-4 GM/100ML-% IV SOLN
2.0000 g | INTRAVENOUS | Status: AC
  Administered 2018-05-21: 2 g via INTRAVENOUS
  Filled 2018-05-20 (×2): qty 100

## 2018-05-20 MED ORDER — MONTELUKAST SODIUM 10 MG PO TABS: 10.0000 mg | ORAL_TABLET | Freq: Every day | ORAL | Status: DC

## 2018-05-20 MED ORDER — SODIUM POLYSTYRENE SULFONATE 15 GM/60ML PO SUSP
45.0000 g | Freq: Once | ORAL | Status: AC
  Administered 2018-05-20: 45 g via ORAL
  Filled 2018-05-20: qty 180

## 2018-05-20 MED ORDER — METHOCARBAMOL 500 MG PO TABS
500.0000 mg | ORAL_TABLET | Freq: Three times a day (TID) | ORAL | Status: DC
  Administered 2018-05-20 – 2018-05-27 (×21): 500 mg via ORAL
  Filled 2018-05-20 (×21): qty 1

## 2018-05-20 MED ORDER — CHLORHEXIDINE GLUCONATE 4 % EX LIQD
60.0000 mL | Freq: Once | CUTANEOUS | Status: AC
  Administered 2018-05-21: 4 via TOPICAL
  Filled 2018-05-20: qty 60

## 2018-05-20 MED ORDER — ONDANSETRON HCL 4 MG PO TABS: 4.0000 mg | ORAL_TABLET | Freq: Four times a day (QID) | ORAL | Status: DC | PRN

## 2018-05-20 MED ORDER — ORAL CARE MOUTH RINSE
15.0000 mL | Freq: Two times a day (BID) | OROMUCOSAL | Status: DC
  Administered 2018-05-20 – 2018-05-27 (×13): 15 mL via OROMUCOSAL

## 2018-05-20 MED ORDER — OXYCODONE HCL 5 MG PO TABS
5.0000 mg | ORAL_TABLET | ORAL | Status: DC | PRN
  Administered 2018-05-20 – 2018-05-27 (×21): 10 mg via ORAL
  Filled 2018-05-20 (×21): qty 2

## 2018-05-20 MED ORDER — INSULIN ASPART 100 UNIT/ML ~~LOC~~ SOLN
0.0000 [IU] | SUBCUTANEOUS | Status: DC
  Administered 2018-05-20 (×2): 1 [IU] via SUBCUTANEOUS
  Administered 2018-05-20: 2 [IU] via SUBCUTANEOUS
  Administered 2018-05-20 – 2018-05-21 (×3): 1 [IU] via SUBCUTANEOUS
  Administered 2018-05-22: 2 [IU] via SUBCUTANEOUS
  Administered 2018-05-22: 1 [IU] via SUBCUTANEOUS
  Administered 2018-05-22 (×2): 2 [IU] via SUBCUTANEOUS
  Administered 2018-05-22 – 2018-05-23 (×4): 1 [IU] via SUBCUTANEOUS
  Administered 2018-05-24: 2 [IU] via SUBCUTANEOUS
  Administered 2018-05-24 – 2018-05-26 (×5): 1 [IU] via SUBCUTANEOUS

## 2018-05-20 MED ORDER — ACETAMINOPHEN 650 MG RE SUPP: 650.0000 mg | Freq: Four times a day (QID) | RECTAL | Status: DC | PRN

## 2018-05-20 MED ORDER — ATORVASTATIN CALCIUM 40 MG PO TABS
40.0000 mg | ORAL_TABLET | Freq: Every day | ORAL | Status: DC
  Administered 2018-05-20 – 2018-05-26 (×7): 40 mg via ORAL
  Filled 2018-05-20 (×9): qty 1

## 2018-05-20 MED ORDER — PANTOPRAZOLE SODIUM 40 MG PO TBEC
80.0000 mg | DELAYED_RELEASE_TABLET | Freq: Every day | ORAL | Status: DC
  Administered 2018-05-20 – 2018-05-27 (×8): 80 mg via ORAL
  Filled 2018-05-20 (×9): qty 2

## 2018-05-20 MED ORDER — MORPHINE SULFATE (PF) 2 MG/ML IV SOLN
1.0000 mg | INTRAVENOUS | Status: DC | PRN
  Administered 2018-05-20 – 2018-05-27 (×8): 2 mg via INTRAVENOUS
  Filled 2018-05-20 (×8): qty 1

## 2018-05-20 MED ORDER — POVIDONE-IODINE 10 % EX SWAB: 2.0000 "application " | Freq: Once | CUTANEOUS | Status: DC

## 2018-05-20 MED ORDER — TRAMADOL HCL 50 MG PO TABS: 50.0000 mg | ORAL_TABLET | Freq: Four times a day (QID) | ORAL | Status: DC | PRN

## 2018-05-20 MED ORDER — ACETAMINOPHEN 325 MG PO TABS: 650.0000 mg | ORAL_TABLET | Freq: Four times a day (QID) | ORAL | Status: DC | PRN

## 2018-05-20 NOTE — Consult Note (Signed)
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 recruit hand surgery. Multiple myeloma HLD GERD CAD s/p PCA HTN DM Afib on Plavix/ASA    Lisette Abu, PA-C Orthopedic Surgery [phone] 05/20/2018, 10:01 AM

## 2018-05-20 NOTE — H&P (View-Only) (Signed)
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 Marcelino Scot may address or may need to recruit hand surgery. Multiple myeloma HLD GERD CAD s/p PCA HTN DM Afib on Plavix/ASA    Lisette Abu, PA-C Orthopedic Surgery [phone] 05/20/2018, 10:01 AM
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
## 2018-05-20 NOTE — Progress Notes (Signed)
Orthopedic Tech Progress Note Patient Details:  Richard Vang [birth year] [id number]  Ortho Devices Type of Ortho Device: Shoulder immobilizer Ortho Device/Splint Location: IV in left hand can not apply splint Ortho Device/Splint Interventions: Application   Post Interventions Patient Tolerated: Well Instructions Provided: Care of device   [name] 05/20/2018, 12:49 PM
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
## 2018-05-20 NOTE — Progress Notes (Signed)
Left hand film reveals a moderately displaced second metacarpal fracture.  Ortho alerted.  Henreitta Cea 9:55 AM 05/20/2018
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
## 2018-05-21 ENCOUNTER — Inpatient Hospital Stay (HOSPITAL_COMMUNITY): Payer: Medicare HMO

## 2018-05-21 ENCOUNTER — Inpatient Hospital Stay: Payer: Medicare HMO | Admitting: Hematology

## 2018-05-21 ENCOUNTER — Inpatient Hospital Stay: Payer: Medicare HMO

## 2018-05-21 ENCOUNTER — Inpatient Hospital Stay (HOSPITAL_COMMUNITY): Payer: Medicare HMO | Admitting: Anesthesiology

## 2018-05-21 ENCOUNTER — Encounter (HOSPITAL_COMMUNITY)
Admission: EM | Disposition: A | Discharge status: Home Health | Payer: Self-pay | Source: Home / Self Care | Attending: Internal Medicine

## 2018-05-21 ENCOUNTER — Encounter (HOSPITAL_COMMUNITY): Payer: Self-pay | Admitting: Anesthesiology

## 2018-05-21 DIAGNOSIS — N179 Acute kidney failure, unspecified: Secondary | ICD-10-CM

## 2018-05-21 HISTORY — PX: ORIF CLAVICULAR FRACTURE: SHX5055

## 2018-05-21 HISTORY — PX: OPEN REDUCTION INTERNAL FIXATION (ORIF) METACARPAL: SHX6234

## 2018-05-21 LAB — BASIC METABOLIC PANEL
Anion gap: 11 (ref 5–15)
BUN: 65 mg/dL — AB (ref 8–23)
CHLORIDE: 106 mmol/L (ref 98–111)
CO2: 21 mmol/L — AB (ref 22–32)
Calcium: 8.3 mg/dL — ABNORMAL LOW (ref 8.9–10.3)
Creatinine, Ser: 2.97 mg/dL — ABNORMAL HIGH (ref 0.61–1.24)
GFR calc Af Amer: 23 mL/min — ABNORMAL LOW (ref >60)
GFR calc non Af Amer: 20 mL/min — ABNORMAL LOW (ref >60)
GLUCOSE: 131 mg/dL — AB (ref 70–99)
Potassium: 3.7 mmol/L (ref 3.5–5.1)
Sodium: 138 mmol/L (ref 135–145)

## 2018-05-21 LAB — GLUCOSE, CAPILLARY
GLUCOSE-CAPILLARY: 131 mg/dL — AB (ref 70–99)
GLUCOSE-CAPILLARY: 126 mg/dL — AB (ref 70–99)
GLUCOSE-CAPILLARY: 114 mg/dL — AB (ref 70–99)
GLUCOSE-CAPILLARY: 116 mg/dL — AB (ref 70–99)
GLUCOSE-CAPILLARY: 126 mg/dL — AB (ref 70–99)
Glucose-Capillary: 118 mg/dL — ABNORMAL HIGH (ref 70–99)
Glucose-Capillary: 142 mg/dL — ABNORMAL HIGH (ref 70–99)
Glucose-Capillary: 166 mg/dL — ABNORMAL HIGH (ref 70–99)

## 2018-05-21 LAB — CBC
HEMATOCRIT: 29 % — AB (ref 39.0–52.0)
HEMOGLOBIN: 9.2 g/dL — AB (ref 13.0–17.0)
MCH: 28.8 pg (ref 26.0–34.0)
MCHC: 31.7 g/dL (ref 30.0–36.0)
MCV: 90.6 fL (ref 78.0–100.0)
PLATELETS: 117 10*3/uL — AB (ref 150–400)
RBC: 3.2 MIL/uL — AB (ref 4.22–5.81)
RDW: 17.8 % — ABNORMAL HIGH (ref 11.5–15.5)
WBC: 7.6 10*3/uL (ref 4.0–10.5)

## 2018-05-21 LAB — CREATININE, URINE, RANDOM: CREATININE, URINE: 140.8 mg/dL

## 2018-05-21 LAB — PROTIME-INR
INR: 1.76
Prothrombin Time: 20.4 seconds — ABNORMAL HIGH (ref 11.4–15.2)

## 2018-05-21 LAB — SURGICAL PCR SCREEN
MRSA, PCR: NEGATIVE
Staphylococcus aureus: NEGATIVE

## 2018-05-21 LAB — SODIUM, URINE, RANDOM: SODIUM UR: 34 mmol/L

## 2018-05-21 SURGERY — OPEN REDUCTION INTERNAL FIXATION (ORIF) CLAVICULAR FRACTURE
Anesthesia: General | Site: Hand | Laterality: Right

## 2018-05-21 MED ORDER — BACITRACIN ZINC 500 UNIT/GM EX OINT
TOPICAL_OINTMENT | CUTANEOUS | Status: DC | PRN
  Administered 2018-05-21: 1 via TOPICAL

## 2018-05-21 MED ORDER — SODIUM CHLORIDE 0.9 % IV SOLN
INTRAVENOUS | Status: AC
  Administered 2018-05-21 (×2): via INTRAVENOUS

## 2018-05-21 MED ORDER — SUGAMMADEX SODIUM 200 MG/2ML IV SOLN
INTRAVENOUS | Status: DC | PRN
  Administered 2018-05-21: 200 mg via INTRAVENOUS

## 2018-05-21 MED ORDER — LIDOCAINE 2% (20 MG/ML) 5 ML SYRINGE
INTRAMUSCULAR | Status: DC | PRN
  Administered 2018-05-21: 60 mg via INTRAVENOUS

## 2018-05-21 MED ORDER — DEXAMETHASONE SODIUM PHOSPHATE 10 MG/ML IJ SOLN
INTRAMUSCULAR | Status: AC
  Filled 2018-05-21: qty 1

## 2018-05-21 MED ORDER — ONDANSETRON HCL 4 MG/2ML IJ SOLN: 4.0000 mg | Freq: Once | INTRAMUSCULAR | Status: DC | PRN

## 2018-05-21 MED ORDER — TOBRAMYCIN SULFATE 1.2 G IJ SOLR
INTRAMUSCULAR | Status: AC
  Filled 2018-05-21: qty 1.2

## 2018-05-21 MED ORDER — ONDANSETRON HCL 4 MG/2ML IJ SOLN
INTRAMUSCULAR | Status: DC | PRN
  Administered 2018-05-21: 4 mg via INTRAVENOUS

## 2018-05-21 MED ORDER — HYDROMORPHONE HCL 1 MG/ML IJ SOLN: 0.2500 mg | INTRAMUSCULAR | Status: DC | PRN

## 2018-05-21 MED ORDER — ROCURONIUM BROMIDE 10 MG/ML (PF) SYRINGE
PREFILLED_SYRINGE | INTRAVENOUS | Status: DC | PRN
  Administered 2018-05-21: 40 mg via INTRAVENOUS

## 2018-05-21 MED ORDER — MIDAZOLAM HCL 5 MG/5ML IJ SOLN
INTRAMUSCULAR | Status: DC | PRN
  Administered 2018-05-21: 2 mg via INTRAVENOUS

## 2018-05-21 MED ORDER — ROCURONIUM BROMIDE 10 MG/ML (PF) SYRINGE
PREFILLED_SYRINGE | INTRAVENOUS | Status: AC
  Filled 2018-05-21: qty 10

## 2018-05-21 MED ORDER — SODIUM CHLORIDE 0.9 % IV SOLN
INTRAVENOUS | Status: DC | PRN
  Administered 2018-05-21: 50 ug/min via INTRAVENOUS

## 2018-05-21 MED ORDER — LIDOCAINE 2% (20 MG/ML) 5 ML SYRINGE
INTRAMUSCULAR | Status: AC
  Filled 2018-05-21: qty 5

## 2018-05-21 MED ORDER — SUGAMMADEX SODIUM 200 MG/2ML IV SOLN
INTRAVENOUS | Status: AC
  Filled 2018-05-21: qty 2

## 2018-05-21 MED ORDER — MEPERIDINE HCL 25 MG/ML IJ SOLN: 6.2500 mg | INTRAMUSCULAR | Status: DC | PRN

## 2018-05-21 MED ORDER — SODIUM CHLORIDE 0.9 % IV SOLN: INTRAVENOUS | Status: DC

## 2018-05-21 MED ORDER — LACTATED RINGERS IV SOLN
INTRAVENOUS | Status: DC | PRN
  Administered 2018-05-21 (×2): via INTRAVENOUS

## 2018-05-21 MED ORDER — DEXAMETHASONE SODIUM PHOSPHATE 10 MG/ML IJ SOLN
INTRAMUSCULAR | Status: DC | PRN
  Administered 2018-05-21: 5 mg via INTRAVENOUS

## 2018-05-21 MED ORDER — VANCOMYCIN HCL 1000 MG IV SOLR
INTRAVENOUS | Status: DC | PRN
  Administered 2018-05-21: 1000 mg via TOPICAL

## 2018-05-21 MED ORDER — PROPOFOL 10 MG/ML IV BOLUS
INTRAVENOUS | Status: DC | PRN
  Administered 2018-05-21: 50 mg via INTRAVENOUS
  Administered 2018-05-21: 40 mg via INTRAVENOUS

## 2018-05-21 MED ORDER — CEFAZOLIN SODIUM-DEXTROSE 2-4 GM/100ML-% IV SOLN
2.0000 g | Freq: Three times a day (TID) | INTRAVENOUS | Status: AC
  Administered 2018-05-21 – 2018-05-22 (×3): 2 g via INTRAVENOUS
  Filled 2018-05-21 (×3): qty 100

## 2018-05-21 MED ORDER — 0.9 % SODIUM CHLORIDE (POUR BTL) OPTIME
TOPICAL | Status: DC | PRN
  Administered 2018-05-21: 1000 mL

## 2018-05-21 MED ORDER — FENTANYL CITRATE (PF) 250 MCG/5ML IJ SOLN
INTRAMUSCULAR | Status: AC
  Filled 2018-05-21: qty 5

## 2018-05-21 MED ORDER — MEPERIDINE HCL 50 MG/ML IJ SOLN: 6.2500 mg | INTRAMUSCULAR | Status: DC | PRN

## 2018-05-21 MED ORDER — FENTANYL CITRATE (PF) 100 MCG/2ML IJ SOLN
INTRAMUSCULAR | Status: DC | PRN
  Administered 2018-05-21: 50 ug via INTRAVENOUS
  Administered 2018-05-21: 25 ug via INTRAVENOUS
  Administered 2018-05-21 (×3): 50 ug via INTRAVENOUS
  Administered 2018-05-21: 25 ug via INTRAVENOUS

## 2018-05-21 MED ORDER — MIDAZOLAM HCL 2 MG/2ML IJ SOLN
INTRAMUSCULAR | Status: AC
Start: 2018-05-21 — End: ?
  Filled 2018-05-21: qty 2

## 2018-05-21 MED ORDER — VANCOMYCIN HCL 500 MG IV SOLR
INTRAVENOUS | Status: AC
  Filled 2018-05-21: qty 500

## 2018-05-21 MED ORDER — PHENYLEPHRINE 40 MCG/ML (10ML) SYRINGE FOR IV PUSH (FOR BLOOD PRESSURE SUPPORT)
PREFILLED_SYRINGE | INTRAVENOUS | Status: DC | PRN
  Administered 2018-05-21 (×4): 80 ug via INTRAVENOUS

## 2018-05-21 MED ORDER — ALBUMIN HUMAN 5 % IV SOLN
INTRAVENOUS | Status: DC | PRN
  Administered 2018-05-21 (×2): via INTRAVENOUS

## 2018-05-21 MED ORDER — ONDANSETRON HCL 4 MG/2ML IJ SOLN
INTRAMUSCULAR | Status: AC
  Filled 2018-05-21: qty 2

## 2018-05-21 MED ORDER — BACITRACIN ZINC 500 UNIT/GM EX OINT
TOPICAL_OINTMENT | CUTANEOUS | Status: AC
  Filled 2018-05-21: qty 28.35

## 2018-05-21 MED ORDER — ONDANSETRON HCL 4 MG/2ML IJ SOLN
4.0000 mg | Freq: Once | INTRAMUSCULAR | Status: DC | PRN
Start: 2018-05-21 — End: 2018-05-21

## 2018-05-21 MED ORDER — VANCOMYCIN HCL 1000 MG IV SOLR
INTRAVENOUS | Status: AC
  Filled 2018-05-21: qty 1000

## 2018-05-21 MED ORDER — PHENYLEPHRINE 40 MCG/ML (10ML) SYRINGE FOR IV PUSH (FOR BLOOD PRESSURE SUPPORT)
PREFILLED_SYRINGE | INTRAVENOUS | Status: AC
  Filled 2018-05-21: qty 10

## 2018-05-21 MED ORDER — VANCOMYCIN HCL 500 MG IV SOLR
INTRAVENOUS | Status: DC | PRN
  Administered 2018-05-21: 500 mg

## 2018-05-21 MED ORDER — LACTATED RINGERS IV SOLN
INTRAVENOUS | Status: DC | PRN
  Administered 2018-05-21: 14:00:00 via INTRAVENOUS

## 2018-05-21 SURGICAL SUPPLY — 66 items
BANDAGE ACE 3X5.8 VEL STRL LF (GAUZE/BANDAGES/DRESSINGS) ×2 IMPLANT
BIT DRILL 1.1 THRED HOLE 65 (BIT) ×2 IMPLANT
BIT DRILL 1.5 THRED HOLE 83 (BIT) ×2 IMPLANT
BIT DRILL 2.5 X LONG (BIT) ×3
BIT DRILL LONG 2.7 (BIT) ×1 IMPLANT
BIT DRILL QC 2X125 (BIT) ×1 IMPLANT
BIT DRILL X LONG 2.5 (BIT) ×1 IMPLANT
BRUSH SCRUB SURG 4.25 DISP (MISCELLANEOUS) ×8 IMPLANT
CHLORAPREP W/TINT 26ML (MISCELLANEOUS) ×4 IMPLANT
COVER SURGICAL LIGHT HANDLE (MISCELLANEOUS) ×10 IMPLANT
CUFF TOURNIQUET SINGLE 24IN (TOURNIQUET CUFF) ×2 IMPLANT
DERMABOND ADVANCED (GAUZE/BANDAGES/DRESSINGS) ×1
DERMABOND ADVANCED .7 DNX12 (GAUZE/BANDAGES/DRESSINGS) ×5 IMPLANT
DRAPE C-ARM 42X72 X-RAY (DRAPES) ×8 IMPLANT
DRAPE EXTREMITY T 121X128X90 (DRAPE) ×2 IMPLANT
DRAPE HALF SHEET 40X57 (DRAPES) ×2 IMPLANT
DRAPE INCISE IOBAN 66X45 STRL (DRAPES) ×4 IMPLANT
DRAPE ORTHO SPLIT 77X108 STRL (DRAPES) ×2
DRAPE SURG 17X23 STRL (DRAPES) ×8 IMPLANT
DRAPE SURG ORHT 6 SPLT 77X108 (DRAPES) ×6 IMPLANT
DRILL BIT LONG 2.7 (BIT) ×4
DRILL BIT QC 2X125 (BIT) ×1
DRILL BIT X LONG 2.5 (BIT) ×1
DRSG EMULSION OIL 3X3 NADH (GAUZE/BANDAGES/DRESSINGS) ×2 IMPLANT
DRSG MEPILEX BORDER 4X8 (GAUZE/BANDAGES/DRESSINGS) ×2 IMPLANT
ELECT REM PT RETURN 9FT ADLT (ELECTROSURGICAL) ×4
ELECTRODE REM PT RTRN 9FT ADLT (ELECTROSURGICAL) ×3 IMPLANT
GAUZE SPONGE 4X4 12PLY STRL (GAUZE/BANDAGES/DRESSINGS) ×2 IMPLANT
GLOVE BIO SURGEON STRL SZ7.5 (GLOVE) ×22 IMPLANT
GLOVE BIOGEL PI IND STRL 7.5 (GLOVE) ×5 IMPLANT
GLOVE BIOGEL PI INDICATOR 7.5 (GLOVE) ×3
GLOVE INDICATOR 7.5 STRL GRN (GLOVE) ×2 IMPLANT
GOWN STRL REUS W/ TWL LRG LVL3 (GOWN DISPOSABLE) ×6 IMPLANT
GOWN STRL REUS W/TWL LRG LVL3 (GOWN DISPOSABLE) ×2
KIT BASIN OR (CUSTOM PROCEDURE TRAY) ×4 IMPLANT
KIT TURNOVER KIT B (KITS) ×4 IMPLANT
MANIFOLD NEPTUNE II (INSTRUMENTS) ×4 IMPLANT
NS IRRIG 1000ML POUR BTL (IV SOLUTION) ×4 IMPLANT
PACK GENERAL/GYN (CUSTOM PROCEDURE TRAY) ×4 IMPLANT
PAD ARMBOARD 7.5X6 YLW CONV (MISCELLANEOUS) ×8 IMPLANT
PENCIL BUTTON HOLSTER BLD 10FT (ELECTRODE) ×2 IMPLANT
PLATE LCP 3.5 7H 98 (Plate) ×2 IMPLANT
PLATE STRAIGHT VAL 2.0 6H (Plate) ×2 IMPLANT
SCREW CORTEX 2X11 (Screw) ×2 IMPLANT
SCREW CORTEX 3.5 16MM (Screw) ×4 IMPLANT
SCREW CORTEX 3.5 18MM (Screw) ×3 IMPLANT
SCREW CORTEX 3.5 20MM (Screw) ×1 IMPLANT
SCREW CORTEX SLFTPNG 12MM (Screw) ×4 IMPLANT
SCREW CORTEX SLFTPNG 13MM (Screw) ×2 IMPLANT
SCREW LCP STARDRIVE ST 1.5X12 (Screw) ×2 IMPLANT
SCREW LOCK CORT ST 3.5X16 (Screw) ×4 IMPLANT
SCREW LOCK CORT ST 3.5X18 (Screw) ×3 IMPLANT
SCREW LOCK CORT ST 3.5X20 (Screw) ×1 IMPLANT
SCREW LOCKING VA 2.0 11 (Screw) ×2 IMPLANT
STAPLER VISISTAT 35W (STAPLE) ×4 IMPLANT
SUCTION FRAZIER HANDLE 10FR (MISCELLANEOUS) ×1
SUCTION TUBE FRAZIER 10FR DISP (MISCELLANEOUS) ×3 IMPLANT
SUT ETHILON 4 0 PS 2 18 (SUTURE) ×2 IMPLANT
SUT MNCRL AB 3-0 PS2 18 (SUTURE) ×4 IMPLANT
SUT MNCRL AB 3-0 PS2 27 (SUTURE) ×4 IMPLANT
SUT VIC AB 0 CT1 27 (SUTURE) ×2
SUT VIC AB 0 CT1 27XBRD ANBCTR (SUTURE) ×4 IMPLANT
SUT VIC AB 2-0 CT1 27 (SUTURE) ×1
SUT VIC AB 2-0 CT1 TAPERPNT 27 (SUTURE) ×3 IMPLANT
TUBE CONNECTING 12X1/4 (SUCTIONS) ×2 IMPLANT
WATER STERILE IRR 1000ML POUR (IV SOLUTION) ×4 IMPLANT
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
## 2018-05-21 NOTE — Transfer of Care (Signed)
Immediate Anesthesia Transfer of Care Note  Patient: Richard Vang  Procedure(s) Performed: OPEN REDUCTION INTERNAL FIXATION (ORIF) CLAVICULAR FRACTURE (Right Chest) OPEN REDUCTION INTERNAL FIXATION (ORIF) 2ND METACARPAL (Left Hand)  Patient Location: PACU  Anesthesia Type:General  Level of Consciousness: awake, alert  and oriented  Airway & Oxygen Therapy: Patient Spontanous Breathing and Patient connected to face mask oxygen  Post-op Assessment: Report given to RN and Post -op Vital signs reviewed and stable  Post vital signs: Reviewed and stable  Last Vitals:  Vitals Value Taken Time  BP 107/78 05/21/2018  4:59 PM  Temp    Pulse 101 05/21/2018  5:00 PM  Resp 12 05/21/2018  5:00 PM  SpO2 91 % 05/21/2018  5:00 PM  Vitals shown include unvalidated device data.  Last Pain:  Vitals:   05/21/18 1000  TempSrc:   PainSc: 7          Complications: No apparent anesthesia complications

## 2018-05-21 NOTE — Progress Notes (Signed)
OT Cancellation Note  Patient Details Name: Richard Vang MRN: [id number] DOB: 19-Jul-1946   Cancelled Treatment:    Reason Eval/Treat Not Completed: Patient at procedure or test/ unavailable. Pt to have surgery today, per trauma note therapies post surgery. Will re-attempt eval tomorrow.  Almon Register [phone] 05/21/2018, 11:27 AM

## 2018-05-21 NOTE — Anesthesia Preprocedure Evaluation (Addendum)
Anesthesia Evaluation  Patient identified by MRN, date of birth, ID band Patient awake    Reviewed: Allergy & Precautions, NPO status , Patient's Chart, lab work & pertinent test results  Airway Mallampati: II  TM Distance: >3 FB Neck ROM: Full    Dental  (+) Poor Dentition, Dental Advisory Given   Pulmonary sleep apnea ,    Pulmonary exam normal breath sounds clear to auscultation       Cardiovascular hypertension, Pt. on medications + CAD  Normal cardiovascular exam Rhythm:Irregular Rate:Normal  ECHO 09/2016 Study Conclusions  - Left ventricle: The cavity size was normal. Septal wall thickness   was increased in a pattern of mild LVH with severe hypertrophy of   the posterior wall. Systolic function was normal. The estimated   ejection fraction was in the range of 55% to 60%. Wall motion was   normal; there were no regional wall motion abnormalities. The   study is not technically sufficient to allow evaluation of LV   diastolic function. Doppler parameters are consistent with   indeterminate ventricular filling pressure. - Aortic valve: Transvalvular velocity was within the normal range.   There was no stenosis. There was moderate regurgitation. - Mitral valve: Transvalvular velocity was within the normal range.   There was no evidence for stenosis. There was no regurgitation. - Left atrium: The atrium was severely dilated. - Right ventricle: The cavity size was moderately dilated. Wall   thickness was normal. Systolic function was normal. - Right atrium: The atrium was severely dilated. - Atrial septum: No defect or patent foramen ovale was identified   by color flow Doppler. - Tricuspid valve: There was moderate regurgitation. - Pulmonic valve: There was moderate regurgitation. - Pulmonary arteries: Systolic pressure was mildly increased. PA   peak pressure: 44 mm Hg (S).    Neuro/Psych    GI/Hepatic GERD   Medicated and Controlled,  Endo/Other  diabetes  Renal/GU      Musculoskeletal   Abdominal   Peds  Hematology   Anesthesia Other Findings   Reproductive/Obstetrics                           Anesthesia Physical Anesthesia Plan  ASA: III  Anesthesia Plan: General   Post-op Pain Management:    Induction:   PONV Risk Score and Plan: 2  Airway Management Planned: Oral ETT  Additional Equipment:   Intra-op Plan:   Post-operative Plan: Extubation in OR  Informed Consent: I have reviewed the patients History and Physical, chart, labs and discussed the procedure including the risks, benefits and alternatives for the proposed anesthesia with the patient or authorized representative who has indicated his/her understanding and acceptance.     Plan Discussed with: CRNA and Surgeon  Anesthesia Plan Comments:         Anesthesia Quick Evaluation

## 2018-05-21 NOTE — Progress Notes (Signed)
PT Cancellation Note  Patient Details Name: Richard Vang MRN: [id number] DOB: [birth year]   Cancelled Treatment:    Reason Eval/Treat Not Completed: Medical issues which prohibited therapy(Pt in surgery. per trauma note therapies post surgery.  Will evaluate tomorrow.  Thanks.    Richard Vang 05/21/2018, 1:06 PM  M[facility] [phone] (pager)

## 2018-05-21 NOTE — Anesthesia Postprocedure Evaluation (Signed)
Anesthesia Post Note  Patient: Richard Vang  Procedure(s) Performed: OPEN REDUCTION INTERNAL FIXATION (ORIF) CLAVICULAR FRACTURE (Right Chest) OPEN REDUCTION INTERNAL FIXATION (ORIF) 2ND METACARPAL (Left Hand)     Patient location during evaluation: PACU Anesthesia Type: General Level of consciousness: awake and alert Pain management: pain level controlled Vital Signs Assessment: post-procedure vital signs reviewed and stable Respiratory status: spontaneous breathing, nonlabored ventilation, respiratory function stable and patient connected to nasal cannula oxygen Cardiovascular status: blood pressure returned to baseline and stable Postop Assessment: no apparent nausea or vomiting Anesthetic complications: no    Last Vitals:  Vitals:   05/21/18 1125 05/21/18 1700  BP: 99/65   Pulse: 90   Resp: 15   Temp:  37 C  SpO2:      Last Pain:  Vitals:   05/21/18 1706  TempSrc:   PainSc: 0-No pain                 [name] S
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
## 2018-05-21 NOTE — Progress Notes (Signed)
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 COMPLETE 3+ VIEW COMPARISON:  None. FINDINGS: Moderately displaced oblique fracture is seen involving the second metacarpal. Severe degenerative changes seen involving the first metacarpophalangeal joint. Vascular calcifications are noted. IMPRESSION: Moderately displaced second metacarpal fracture. Electronically Signed   By: Marijo Conception, M.D.   On: 05/20/2018 09:48     Phillips Climes M.D on 05/21/2018 at 2:38 PM  Between 7am to 7pm - Pager - [phone]  After 7pm go to [email or web address] - password [facility]  [facility] -  Office  [phone]

## 2018-05-21 NOTE — Anesthesia Procedure Notes (Addendum)
Procedure Name: Intubation Date/Time: 05/21/2018 2:04 PM Performed by: Jenne Campus, CRNA Pre-anesthesia Checklist: Patient identified, Emergency Drugs available, Suction available and Patient being monitored Patient Re-evaluated:Patient Re-evaluated prior to induction Oxygen Delivery Method: Circle System Utilized Preoxygenation: Pre-oxygenation with 100% oxygen Induction Type: IV induction Ventilation: Mask ventilation without difficulty Laryngoscope Size: Miller and 3 Grade View: Grade I Tube type: Oral Tube size: 7.5 mm Number of attempts: 1 Airway Equipment and Method: Stylet and Oral airway Placement Confirmation: ETT inserted through vocal cords under direct vision,  positive ETCO2 and breath sounds checked- equal and bilateral Secured at: 23 cm Tube secured with: Tape Dental Injury: Teeth and Oropharynx as per pre-operative assessment
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
## 2018-05-22 ENCOUNTER — Inpatient Hospital Stay (HOSPITAL_COMMUNITY): Payer: Medicare HMO

## 2018-05-22 DIAGNOSIS — D649 Anemia, unspecified: Secondary | ICD-10-CM

## 2018-05-22 LAB — BASIC METABOLIC PANEL
ANION GAP: 9 (ref 5–15)
BUN: 54 mg/dL — ABNORMAL HIGH (ref 8–23)
CO2: 23 mmol/L (ref 22–32)
Calcium: 7.7 mg/dL — ABNORMAL LOW (ref 8.9–10.3)
Chloride: 110 mmol/L (ref 98–111)
Creatinine, Ser: 2.28 mg/dL — ABNORMAL HIGH (ref 0.61–1.24)
GFR calc non Af Amer: 27 mL/min — ABNORMAL LOW (ref >60)
GFR, EST AFRICAN AMERICAN: 32 mL/min — AB (ref >60)
GLUCOSE: 180 mg/dL — AB (ref 70–99)
POTASSIUM: 3.8 mmol/L (ref 3.5–5.1)
Sodium: 142 mmol/L (ref 135–145)

## 2018-05-22 LAB — CBC
HEMATOCRIT: 23.9 % — AB (ref 39.0–52.0)
Hemoglobin: 7.4 g/dL — ABNORMAL LOW (ref 13.0–17.0)
MCH: 28.8 pg (ref 26.0–34.0)
MCHC: 31 g/dL (ref 30.0–36.0)
MCV: 93 fL (ref 78.0–100.0)
PLATELETS: 95 10*3/uL — AB (ref 150–400)
RBC: 2.57 MIL/uL — AB (ref 4.22–5.81)
RDW: 18 % — AB (ref 11.5–15.5)
WBC: 7.2 10*3/uL (ref 4.0–10.5)

## 2018-05-22 LAB — GLUCOSE, CAPILLARY
GLUCOSE-CAPILLARY: 125 mg/dL — AB (ref 70–99)
GLUCOSE-CAPILLARY: 136 mg/dL — AB (ref 70–99)
GLUCOSE-CAPILLARY: 137 mg/dL — AB (ref 70–99)
Glucose-Capillary: 163 mg/dL — ABNORMAL HIGH (ref 70–99)
Glucose-Capillary: 151 mg/dL — ABNORMAL HIGH (ref 70–99)

## 2018-05-22 LAB — PREPARE RBC (CROSSMATCH)

## 2018-05-22 MED ORDER — WARFARIN - PHARMACIST DOSING INPATIENT
Freq: Every day | Status: DC
  Administered 2018-05-22 – 2018-05-25 (×4)

## 2018-05-22 MED ORDER — WARFARIN SODIUM 3 MG PO TABS
6.0000 mg | ORAL_TABLET | Freq: Once | ORAL | Status: AC
  Administered 2018-05-22: 6 mg via ORAL
  Filled 2018-05-22: qty 2

## 2018-05-22 MED ORDER — VITAMIN D (ERGOCALCIFEROL) 1.25 MG (50000 UNIT) PO CAPS
50000.0000 [IU] | ORAL_CAPSULE | ORAL | Status: DC
  Administered 2018-05-22: 50000 [IU] via ORAL
  Filled 2018-05-22 (×3): qty 1

## 2018-05-22 MED ORDER — SODIUM CHLORIDE 0.9% IV SOLUTION
Freq: Once | INTRAVENOUS | Status: AC
  Administered 2018-05-22: 16:00:00 via INTRAVENOUS
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
## 2018-05-23 LAB — CBC
HEMATOCRIT: 25 % — AB (ref 39.0–52.0)
Hemoglobin: 7.8 g/dL — ABNORMAL LOW (ref 13.0–17.0)
MCH: 28.6 pg (ref 26.0–34.0)
MCHC: 31.2 g/dL (ref 30.0–36.0)
MCV: 91.6 fL (ref 78.0–100.0)
PLATELETS: 82 10*3/uL — AB (ref 150–400)
RBC: 2.73 MIL/uL — ABNORMAL LOW (ref 4.22–5.81)
RDW: 18.9 % — AB (ref 11.5–15.5)
WBC: 6.7 10*3/uL (ref 4.0–10.5)

## 2018-05-23 LAB — BASIC METABOLIC PANEL
ANION GAP: 8 (ref 5–15)
BUN: 43 mg/dL — AB (ref 8–23)
CO2: 24 mmol/L (ref 22–32)
CREATININE: 1.89 mg/dL — AB (ref 0.61–1.24)
Calcium: 8.2 mg/dL — ABNORMAL LOW (ref 8.9–10.3)
Chloride: 111 mmol/L (ref 98–111)
GFR calc Af Amer: 40 mL/min — ABNORMAL LOW (ref >60)
GFR calc non Af Amer: 34 mL/min — ABNORMAL LOW (ref >60)
GLUCOSE: 116 mg/dL — AB (ref 70–99)
Potassium: 3.6 mmol/L (ref 3.5–5.1)
Sodium: 143 mmol/L (ref 135–145)

## 2018-05-23 LAB — GLUCOSE, CAPILLARY
GLUCOSE-CAPILLARY: 113 mg/dL — AB (ref 70–99)
GLUCOSE-CAPILLARY: 138 mg/dL — AB (ref 70–99)
Glucose-Capillary: 111 mg/dL — ABNORMAL HIGH (ref 70–99)
Glucose-Capillary: 110 mg/dL — ABNORMAL HIGH (ref 70–99)
Glucose-Capillary: 112 mg/dL — ABNORMAL HIGH (ref 70–99)
Glucose-Capillary: 131 mg/dL — ABNORMAL HIGH (ref 70–99)

## 2018-05-23 LAB — PROTIME-INR
INR: 1.61
Prothrombin Time: 19 seconds — ABNORMAL HIGH (ref 11.4–15.2)

## 2018-05-23 LAB — PREPARE RBC (CROSSMATCH)

## 2018-05-23 MED ORDER — SODIUM CHLORIDE 0.9% IV SOLUTION
Freq: Once | INTRAVENOUS | Status: AC
  Administered 2018-05-23: 15:00:00 via INTRAVENOUS

## 2018-05-23 MED ORDER — FUROSEMIDE 10 MG/ML IJ SOLN
40.0000 mg | Freq: Once | INTRAMUSCULAR | Status: AC
Start: 2018-05-23 — End: 2018-05-23
  Administered 2018-05-23: 40 mg via INTRAVENOUS
  Filled 2018-05-23: qty 4

## 2018-05-23 MED ORDER — TAMSULOSIN HCL 0.4 MG PO CAPS
0.4000 mg | ORAL_CAPSULE | Freq: Every day | ORAL | Status: DC
  Administered 2018-05-23 – 2018-05-25 (×3): 0.4 mg via ORAL
  Filled 2018-05-23 (×3): qty 1

## 2018-05-23 MED ORDER — WARFARIN SODIUM 3 MG PO TABS
6.0000 mg | ORAL_TABLET | Freq: Once | ORAL | Status: AC
  Administered 2018-05-23: 6 mg via ORAL
  Filled 2018-05-23 (×2): qty 2
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
## 2018-05-24 ENCOUNTER — Inpatient Hospital Stay (HOSPITAL_COMMUNITY): Payer: Medicare HMO

## 2018-05-24 DIAGNOSIS — I255 Ischemic cardiomyopathy: Secondary | ICD-10-CM

## 2018-05-24 LAB — TYPE AND SCREEN
ABO/RH(D): B POS
Antibody Screen: NEGATIVE
Unit division: 0
Unit division: 0

## 2018-05-24 LAB — GLUCOSE, CAPILLARY
GLUCOSE-CAPILLARY: 156 mg/dL — AB (ref 70–99)
Glucose-Capillary: 110 mg/dL — ABNORMAL HIGH (ref 70–99)
Glucose-Capillary: 127 mg/dL — ABNORMAL HIGH (ref 70–99)
Glucose-Capillary: 137 mg/dL — ABNORMAL HIGH (ref 70–99)

## 2018-05-24 LAB — CBC
HEMATOCRIT: 29 % — AB (ref 39.0–52.0)
Hemoglobin: 9 g/dL — ABNORMAL LOW (ref 13.0–17.0)
MCH: 28.6 pg (ref 26.0–34.0)
MCHC: 31 g/dL (ref 30.0–36.0)
MCV: 92.1 fL (ref 78.0–100.0)
Platelets: 97 10*3/uL — ABNORMAL LOW (ref 150–400)
RBC: 3.15 MIL/uL — ABNORMAL LOW (ref 4.22–5.81)
RDW: 19.1 % — AB (ref 11.5–15.5)
WBC: 5.8 10*3/uL (ref 4.0–10.5)

## 2018-05-24 LAB — BASIC METABOLIC PANEL
Anion gap: 5 (ref 5–15)
BUN: 35 mg/dL — ABNORMAL HIGH (ref 8–23)
CALCIUM: 8.2 mg/dL — AB (ref 8.9–10.3)
CO2: 27 mmol/L (ref 22–32)
CREATININE: 1.53 mg/dL — AB (ref 0.61–1.24)
Chloride: 113 mmol/L — ABNORMAL HIGH (ref 98–111)
GFR calc non Af Amer: 44 mL/min — ABNORMAL LOW (ref >60)
GFR, EST AFRICAN AMERICAN: 51 mL/min — AB (ref >60)
Glucose, Bld: 146 mg/dL — ABNORMAL HIGH (ref 70–99)
Potassium: 3.7 mmol/L (ref 3.5–5.1)
SODIUM: 145 mmol/L (ref 135–145)

## 2018-05-24 LAB — BPAM RBC
BLOOD PRODUCT EXPIRATION DATE: 201908292359
Blood Product Expiration Date: 201908102359
ISSUE DATE / TIME: 201908031617
ISSUE DATE / TIME: 201908041527
Unit Type and Rh: 1700
Unit Type and Rh: 7300

## 2018-05-24 LAB — PROTIME-INR
INR: 1.67
Prothrombin Time: 19.6 seconds — ABNORMAL HIGH (ref 11.4–15.2)

## 2018-05-24 MED ORDER — FUROSEMIDE 10 MG/ML IJ SOLN
40.0000 mg | Freq: Once | INTRAMUSCULAR | Status: AC
Start: 2018-05-24 — End: 2018-05-24
  Administered 2018-05-24: 40 mg via INTRAVENOUS
  Filled 2018-05-24: qty 4

## 2018-05-24 MED ORDER — WARFARIN SODIUM 7.5 MG PO TABS
7.5000 mg | ORAL_TABLET | Freq: Once | ORAL | Status: AC
  Administered 2018-05-24: 7.5 mg via ORAL
  Filled 2018-05-24: qty 1
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
## 2018-05-24 NOTE — Progress Notes (Signed)
SATURATION QUALIFICATIONS: (This note is used to comply with regulatory documentation for home oxygen)  Patient Saturations on Room Air at Rest =100%  Patient Saturations on Room Air while Ambulating = 75%  Patient Saturations on 3 Liters of oxygen while Ambulating =95%  Please briefly explain why patient needs home oxygen: Pt requires supplemental 02 to maintain 02 saturation above 88% to allow him to perform ADLs safely.        Lucille Passy, OTR/L [phone]
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
## 2018-05-25 DIAGNOSIS — J9601 Acute respiratory failure with hypoxia: Secondary | ICD-10-CM

## 2018-05-25 LAB — GLUCOSE, CAPILLARY
GLUCOSE-CAPILLARY: 118 mg/dL — AB (ref 70–99)
GLUCOSE-CAPILLARY: 121 mg/dL — AB (ref 70–99)
GLUCOSE-CAPILLARY: 127 mg/dL — AB (ref 70–99)
Glucose-Capillary: 112 mg/dL — ABNORMAL HIGH (ref 70–99)
Glucose-Capillary: 121 mg/dL — ABNORMAL HIGH (ref 70–99)
Glucose-Capillary: 135 mg/dL — ABNORMAL HIGH (ref 70–99)

## 2018-05-25 LAB — PROTIME-INR
INR: 1.63
Prothrombin Time: 19.2 seconds — ABNORMAL HIGH (ref 11.4–15.2)

## 2018-05-25 LAB — CBC
HEMATOCRIT: 32.6 % — AB (ref 39.0–52.0)
HEMOGLOBIN: 10.4 g/dL — AB (ref 13.0–17.0)
MCH: 29.2 pg (ref 26.0–34.0)
MCHC: 31.9 g/dL (ref 30.0–36.0)
MCV: 91.6 fL (ref 78.0–100.0)
Platelets: 117 10*3/uL — ABNORMAL LOW (ref 150–400)
RBC: 3.56 MIL/uL — ABNORMAL LOW (ref 4.22–5.81)
RDW: 18.5 % — ABNORMAL HIGH (ref 11.5–15.5)
WBC: 6.3 10*3/uL (ref 4.0–10.5)

## 2018-05-25 MED ORDER — WARFARIN SODIUM 7.5 MG PO TABS
7.5000 mg | ORAL_TABLET | Freq: Once | ORAL | Status: AC
  Administered 2018-05-25: 7.5 mg via ORAL
  Filled 2018-05-25: qty 1

## 2018-05-25 MED ORDER — FUROSEMIDE 20 MG PO TABS
20.0000 mg | ORAL_TABLET | Freq: Every day | ORAL | Status: DC
Start: 2018-05-25 — End: 2018-05-27
  Administered 2018-05-25 – 2018-05-27 (×3): 20 mg via ORAL
  Filled 2018-05-25 (×3): qty 1

## 2018-05-25 MED ORDER — POTASSIUM CHLORIDE CRYS ER 20 MEQ PO TBCR
40.0000 meq | EXTENDED_RELEASE_TABLET | Freq: Once | ORAL | Status: AC
  Administered 2018-05-25: 40 meq via ORAL
  Filled 2018-05-25: qty 2
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
## 2018-05-26 DIAGNOSIS — S42001A Fracture of unspecified part of right clavicle, initial encounter for closed fracture: Secondary | ICD-10-CM

## 2018-05-26 DIAGNOSIS — S62321A Displaced fracture of shaft of second metacarpal bone, left hand, initial encounter for closed fracture: Secondary | ICD-10-CM

## 2018-05-26 LAB — CBC
HCT: 31 % — ABNORMAL LOW (ref 39.0–52.0)
HEMOGLOBIN: 9.7 g/dL — AB (ref 13.0–17.0)
MCH: 29.2 pg (ref 26.0–34.0)
MCHC: 31.3 g/dL (ref 30.0–36.0)
MCV: 93.4 fL (ref 78.0–100.0)
PLATELETS: 100 10*3/uL — AB (ref 150–400)
RBC: 3.32 MIL/uL — AB (ref 4.22–5.81)
RDW: 18.3 % — ABNORMAL HIGH (ref 11.5–15.5)
WBC: 5 10*3/uL (ref 4.0–10.5)

## 2018-05-26 LAB — BASIC METABOLIC PANEL
ANION GAP: 9 (ref 5–15)
BUN: 28 mg/dL — AB (ref 8–23)
CO2: 26 mmol/L (ref 22–32)
Calcium: 8.6 mg/dL — ABNORMAL LOW (ref 8.9–10.3)
Chloride: 107 mmol/L (ref 98–111)
Creatinine, Ser: 1.31 mg/dL — ABNORMAL HIGH (ref 0.61–1.24)
GFR calc Af Amer: >60 mL/min (ref >60)
GFR, EST NON AFRICAN AMERICAN: 54 mL/min — AB (ref >60)
Glucose, Bld: 118 mg/dL — ABNORMAL HIGH (ref 70–99)
POTASSIUM: 3.8 mmol/L (ref 3.5–5.1)
SODIUM: 142 mmol/L (ref 135–145)

## 2018-05-26 LAB — GLUCOSE, CAPILLARY
GLUCOSE-CAPILLARY: 100 mg/dL — AB (ref 70–99)
GLUCOSE-CAPILLARY: 115 mg/dL — AB (ref 70–99)
GLUCOSE-CAPILLARY: 118 mg/dL — AB (ref 70–99)
GLUCOSE-CAPILLARY: 122 mg/dL — AB (ref 70–99)
GLUCOSE-CAPILLARY: 128 mg/dL — AB (ref 70–99)
Glucose-Capillary: 138 mg/dL — ABNORMAL HIGH (ref 70–99)

## 2018-05-26 LAB — PROTIME-INR
INR: 1.44
PROTHROMBIN TIME: 17.4 s — AB (ref 11.4–15.2)

## 2018-05-26 MED ORDER — OXYCODONE-ACETAMINOPHEN 10-325 MG PO TABS: 1.0000 | ORAL_TABLET | Freq: Two times a day (BID) | ORAL | 0 refills | Status: AC

## 2018-05-26 MED ORDER — WARFARIN SODIUM 4 MG PO TABS: 5.0000 mg | ORAL_TABLET | Freq: Every day | ORAL | 0 refills | Status: DC

## 2018-05-26 MED ORDER — TAMSULOSIN HCL 0.4 MG PO CAPS: 0.4000 mg | ORAL_CAPSULE | Freq: Every day | ORAL | 0 refills | Status: DC

## 2018-05-26 MED ORDER — WARFARIN SODIUM 10 MG PO TABS
10.0000 mg | ORAL_TABLET | Freq: Once | ORAL | Status: AC
  Administered 2018-05-26: 10 mg via ORAL
  Filled 2018-05-26: qty 1

## 2018-05-26 MED ORDER — INSULIN ASPART 100 UNIT/ML ~~LOC~~ SOLN: 0.0000 [IU] | SUBCUTANEOUS | 11 refills | Status: DC

## 2018-05-26 NOTE — Care Management Important Message (Signed)
Important Message  Patient Details  Name: RAYDEL HOSICK MRN: [id number] Date of Birth: 04-Nov-1945   Medicare Important Message Given:  No    Ariana Cavenaugh 05/26/2018, 3:08 PM
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
## 2018-05-26 NOTE — Care Management Important Message (Signed)
Important Message  Patient Details  Name: Richard Vang MRN: [id number] Date of Birth: 02-12-1946   Medicare Important Message Given:  Yes  Patient not able to write unsigned copy left . Patients rights explained  [name] 05/26/2018, 3:06 PM

## 2018-05-27 ENCOUNTER — Encounter (HOSPITAL_COMMUNITY): Payer: Self-pay | Admitting: Student

## 2018-05-27 DIAGNOSIS — Z9861 Coronary angioplasty status: Secondary | ICD-10-CM

## 2018-05-27 DIAGNOSIS — S42009D Fracture of unspecified part of unspecified clavicle, subsequent encounter for fracture with routine healing: Secondary | ICD-10-CM

## 2018-05-27 DIAGNOSIS — I2510 Atherosclerotic heart disease of native coronary artery without angina pectoris: Secondary | ICD-10-CM

## 2018-05-27 DIAGNOSIS — S42101A Fracture of unspecified part of scapula, right shoulder, initial encounter for closed fracture: Secondary | ICD-10-CM

## 2018-05-27 DIAGNOSIS — I10 Essential (primary) hypertension: Secondary | ICD-10-CM

## 2018-05-27 DIAGNOSIS — R0902 Hypoxemia: Secondary | ICD-10-CM

## 2018-05-27 DIAGNOSIS — I482 Chronic atrial fibrillation: Secondary | ICD-10-CM

## 2018-05-27 DIAGNOSIS — T07XXXA Unspecified multiple injuries, initial encounter: Secondary | ICD-10-CM

## 2018-05-27 LAB — GLUCOSE, CAPILLARY
GLUCOSE-CAPILLARY: 117 mg/dL — AB (ref 70–99)
GLUCOSE-CAPILLARY: 120 mg/dL — AB (ref 70–99)
GLUCOSE-CAPILLARY: 120 mg/dL — AB (ref 70–99)
Glucose-Capillary: 105 mg/dL — ABNORMAL HIGH (ref 70–99)

## 2018-05-27 LAB — CBC
HEMATOCRIT: 34.5 % — AB (ref 39.0–52.0)
Hemoglobin: 10.6 g/dL — ABNORMAL LOW (ref 13.0–17.0)
MCH: 28.6 pg (ref 26.0–34.0)
MCHC: 30.7 g/dL (ref 30.0–36.0)
MCV: 93 fL (ref 78.0–100.0)
PLATELETS: 114 10*3/uL — AB (ref 150–400)
RBC: 3.71 MIL/uL — ABNORMAL LOW (ref 4.22–5.81)
RDW: 18.6 % — AB (ref 11.5–15.5)
WBC: 5.4 10*3/uL (ref 4.0–10.5)

## 2018-05-27 LAB — PROTIME-INR
INR: 1.58
Prothrombin Time: 18.7 seconds — ABNORMAL HIGH (ref 11.4–15.2)

## 2018-05-27 MED ORDER — OXYCODONE HCL 5 MG PO TABS
5.0000 mg | ORAL_TABLET | ORAL | Status: DC | PRN
  Administered 2018-05-27: 5 mg via ORAL
  Filled 2018-05-27: qty 1

## 2018-05-27 MED ORDER — TAMSULOSIN HCL 0.4 MG PO CAPS: 0.4000 mg | ORAL_CAPSULE | Freq: Every day | ORAL | 0 refills | Status: DC

## 2018-05-27 MED ORDER — WARFARIN SODIUM 10 MG PO TABS: 10.0000 mg | ORAL_TABLET | Freq: Once | ORAL | Status: DC

## 2018-05-27 NOTE — Plan of Care (Signed)
Care plans reviewed and patient is progressing.  

## 2018-05-27 NOTE — Progress Notes (Signed)
O2 on room air at rest: 100%  O2 on room air with ambulation: 87% O2 on 2L [place] with ambulation: 90%  Pt ambulated 230 feet with rolling S[place]ina [place]. O2 was 100% on room air at rest in chair. Upon ambulating O2 on room air decreased to 87%. Applied 2L oxygen via [place], O2 level increased to 90%. Tolerated walk well with 2L O2. O2 levels remained greater than 90% with 2L O2.   Fritz Pickerel, RN
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
## 2018-05-27 NOTE — Plan of Care (Signed)
Patient is adequate for discharge.  

## 2018-05-27 NOTE — Progress Notes (Signed)
Mr and Mrs Carlyon given discharge instructions.  Discussed medication changes, new medications and discontinued medications.  Discussed follow up appointments and signs and symptoms to contact the physician for.  Discussed activities and expectations.  Verbalized understanding.
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
## 2018-06-07 ENCOUNTER — Telehealth: Payer: Self-pay
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
## 2018-06-09 IMAGING — CT CT BIOPSY
1 of 2 series · 13 of 32 positions shown, 18 images · non-contrast
Comparison: none

INDICATION: 70-year-old male with a history of possible multiple myeloma.

[Series 2: i-spiral 5.0 b40f · axial · 0.90mm/px · z∈[+1160,+1278]mm · 13 of 38 slices shown, 18 images]
[im 2/38  soft-tissue]
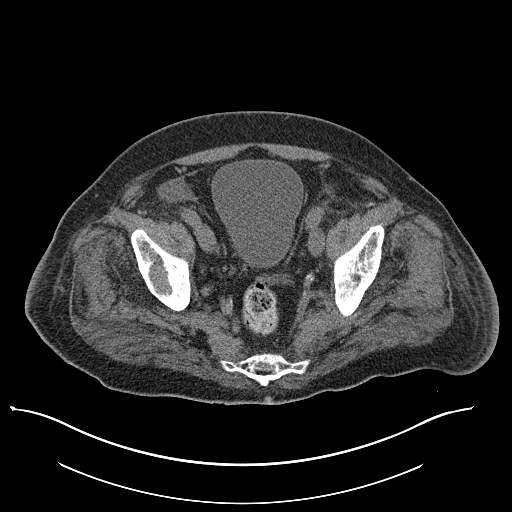
[im 2/38  bone]
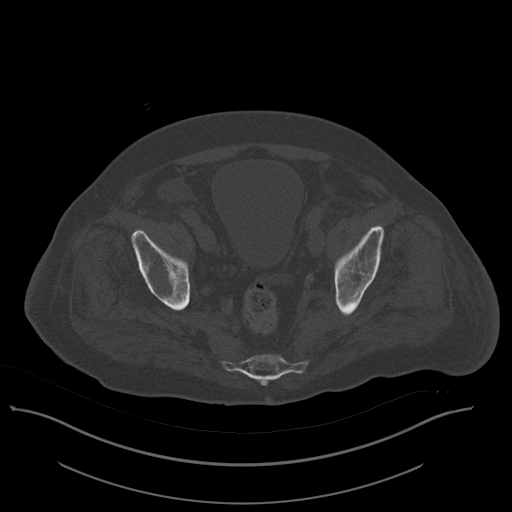
[im 6/38  soft-tissue]
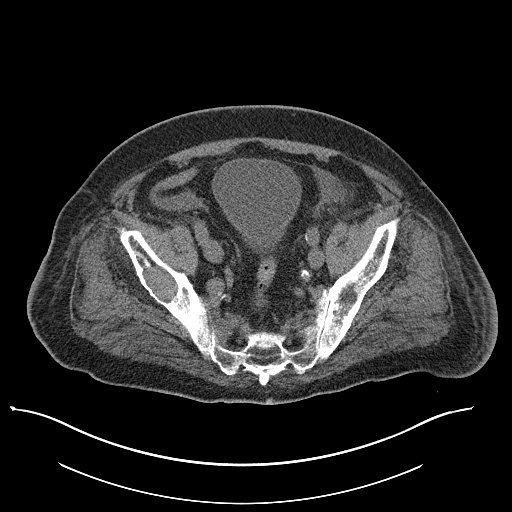
[im 8/38  soft-tissue]
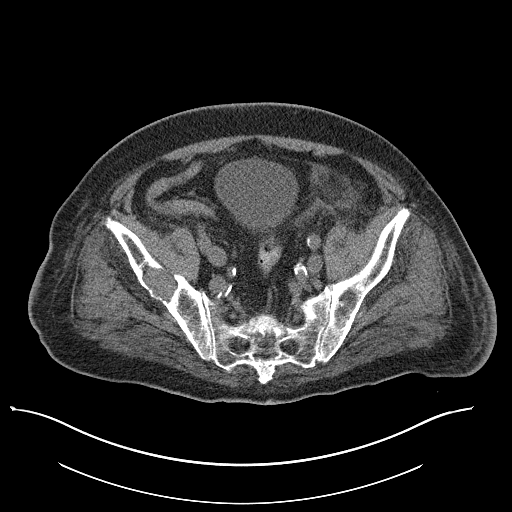
[im 12/38  soft-tissue]
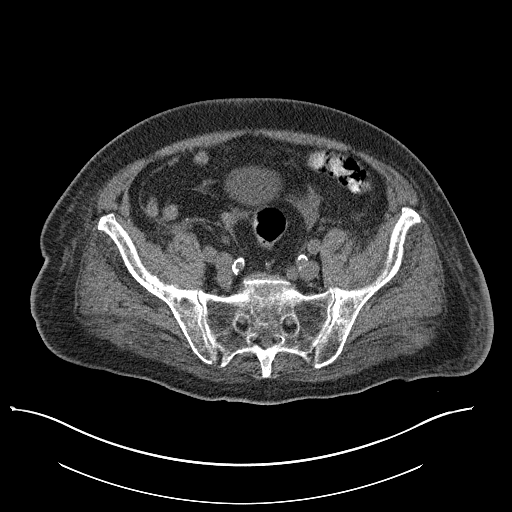
[im 15/38  soft-tissue]
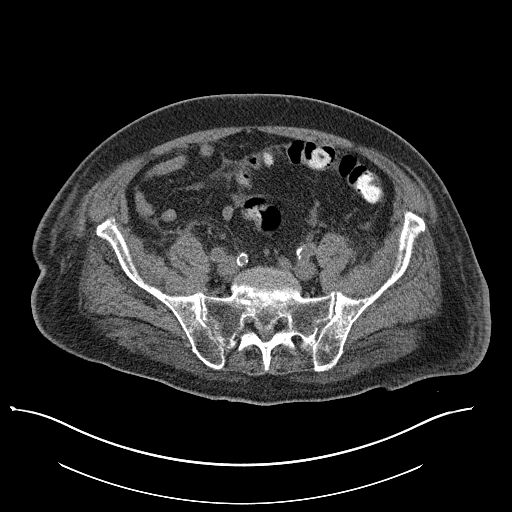
[im 17/38  soft-tissue]
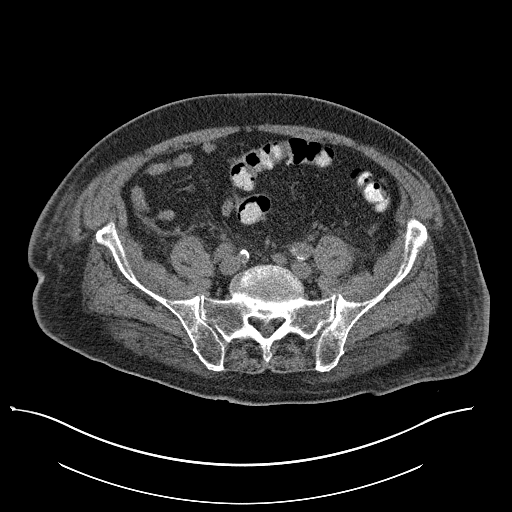
[im 21/38  soft-tissue]
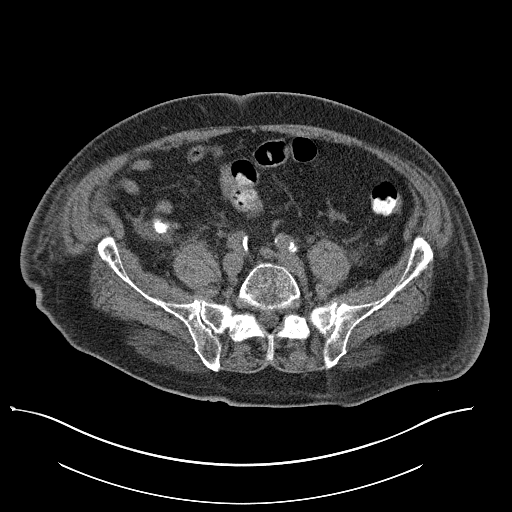
[im 23/38  soft-tissue]
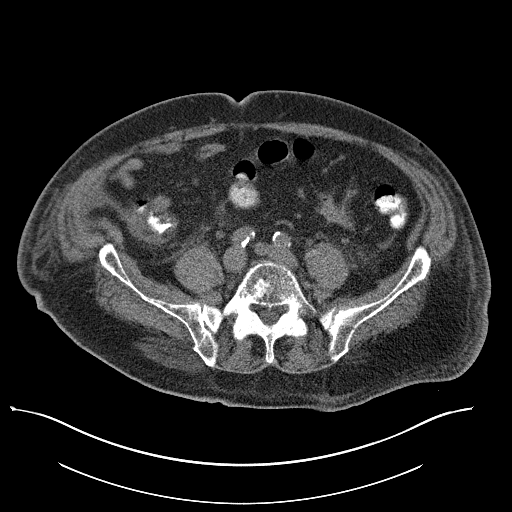
[im 26/38  soft-tissue]
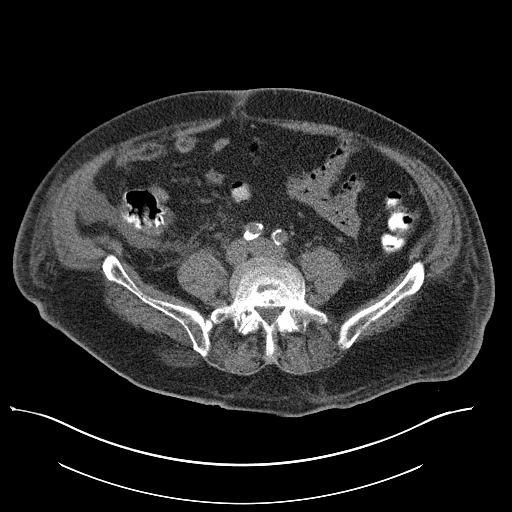
[im 26/38  bone]
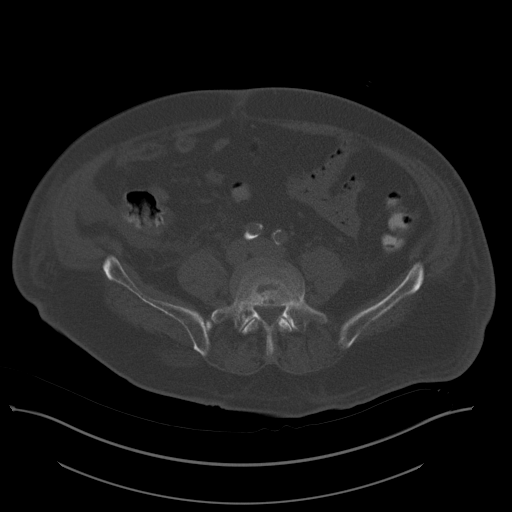
[im 30/38  soft-tissue]
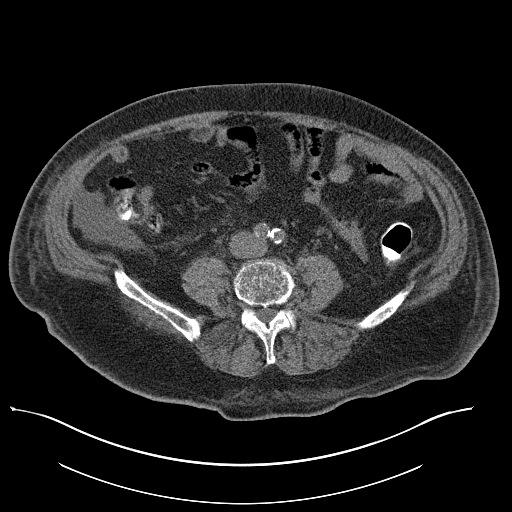
[im 30/38  lung]
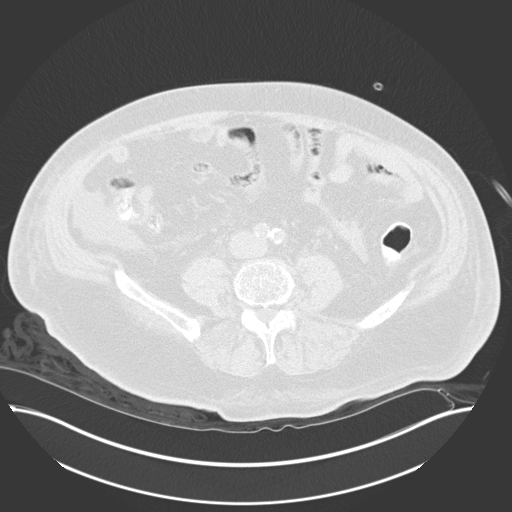
[im 32/38  soft-tissue]
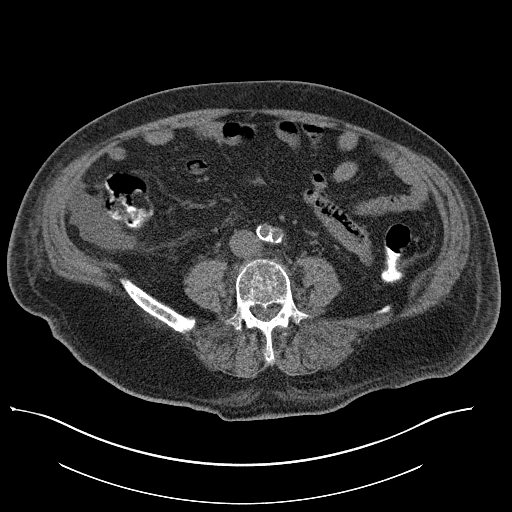
[im 32/38  lung]
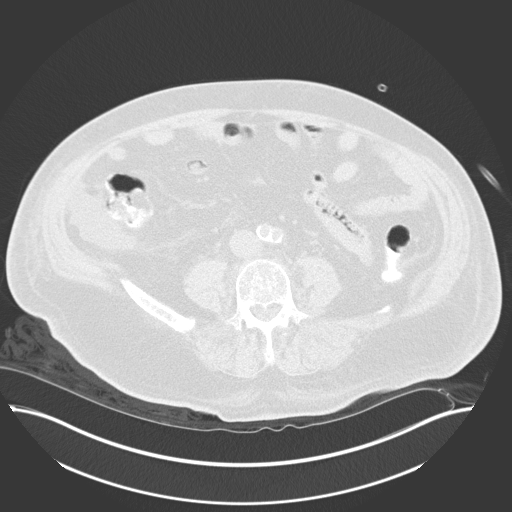
[im 34/38  lung]
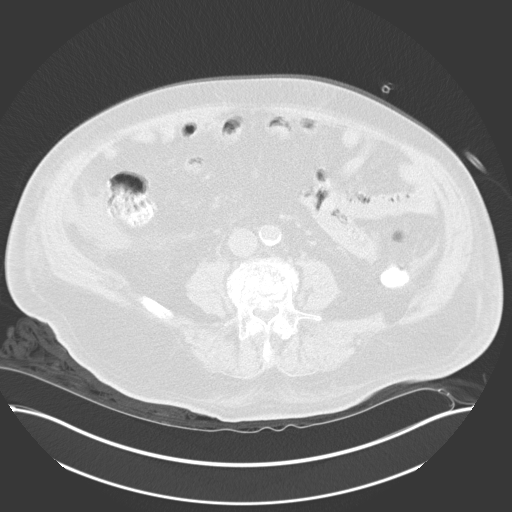
[im 36/38  soft-tissue]
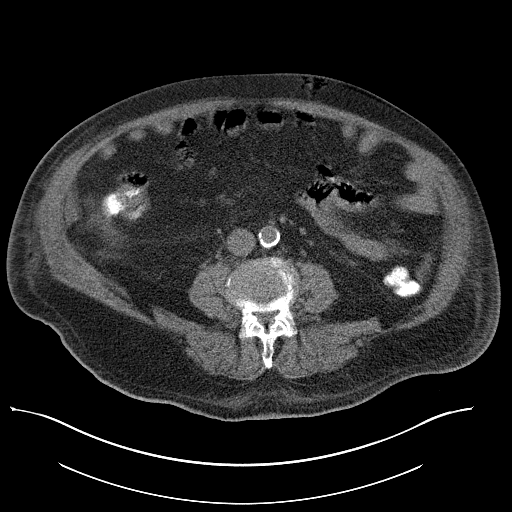
[im 36/38  lung]
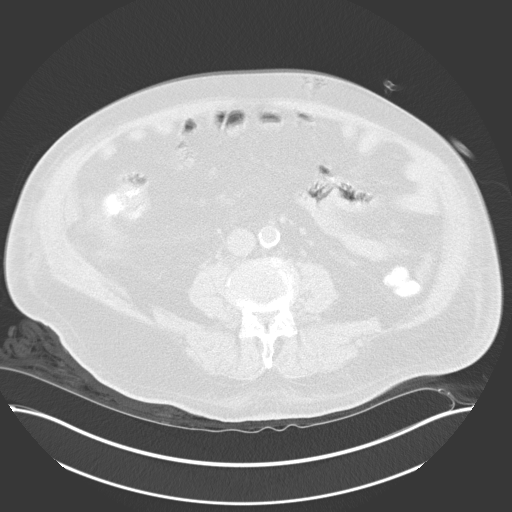

[13 of 32 positions shown; findings below may reference images not displayed]

EXAM:
CT BIOPSY

MEDICATIONS:
None.

ANESTHESIA/SEDATION:
Moderate (conscious) sedation was employed during this procedure. A
total of Versed 1.5 mg and Fentanyl 50 mcg was administered
intravenously.

Moderate Sedation Time: 21 minutes. The patient's level of
consciousness and vital signs were monitored continuously by
radiology nursing throughout the procedure under my direct
supervision.

FLUOROSCOPY TIME:  CT

COMPLICATIONS:
None

PROCEDURE:
The procedure risks, benefits, and alternatives were explained to
the patient. Questions regarding the procedure were encouraged and
answered. The patient understands and consents to the procedure.

Patient was attempted to position into the prone position. He was
unable to comfortably achieve this. When he was turning himself into
a supine position, he experienced left-sided chest pain.

Scout CT of the pelvis was performed for surgical planning purposes.

The anterior pelvis was prepped with Betadinein a sterile fashion,
and a sterile drape was applied covering the operative field. A
sterile gown and sterile gloves were used for the procedure. Local
anesthesia was provided with 1% Lidocaine.

We targeted the right anterior iliac bone for biopsy. The skin and
subcutaneous tissues were infiltrated with 1% lidocaine without
epinephrine. A small stab incision was made with an 11 blade
scalpel, and an 11 gauge Jovy Ann needle was advanced with CT guidance
to the posterior cortex. Manual forced was used to advance the
needle through the posterior cortex and the stylet was removed. A
bone marrow aspirate was retrieved and passed to a cytotechnologist
in the room. The Jovy Ann needle was then advanced without the stylet
for a core biopsy. The core biopsy was retrieved and also passed to
a cytotechnologist.

Manual pressure was used for hemostasis and a sterile dressing was
placed.

No complications were encountered no significant blood loss was
encountered.

Patient tolerated the procedure well and remained hemodynamically
stable throughout.
IMPRESSION: Status post CT-guided bone marrow biopsy, with tissue specimen sent
to pathology for complete histopathologic analysis

PLAN:
Given the patient's acute discomfort during repositioning on the
table, a chest x-ray will be acquired to investigate for a possible
left-sided rib fracture.

## 2018-06-09 IMAGING — CR DG CHEST 1V
1 series · 2 of 2 positions shown · non-contrast
Comparison: 10/02/2016

CLINICAL DATA: Chest pain.  Right iliac crest biopsy in CT.

EXAM:
CHEST 1 VIEW

[Series 2: chest ap · 0.14mm/px · 2 of 2 slices shown]
[im 1/2]
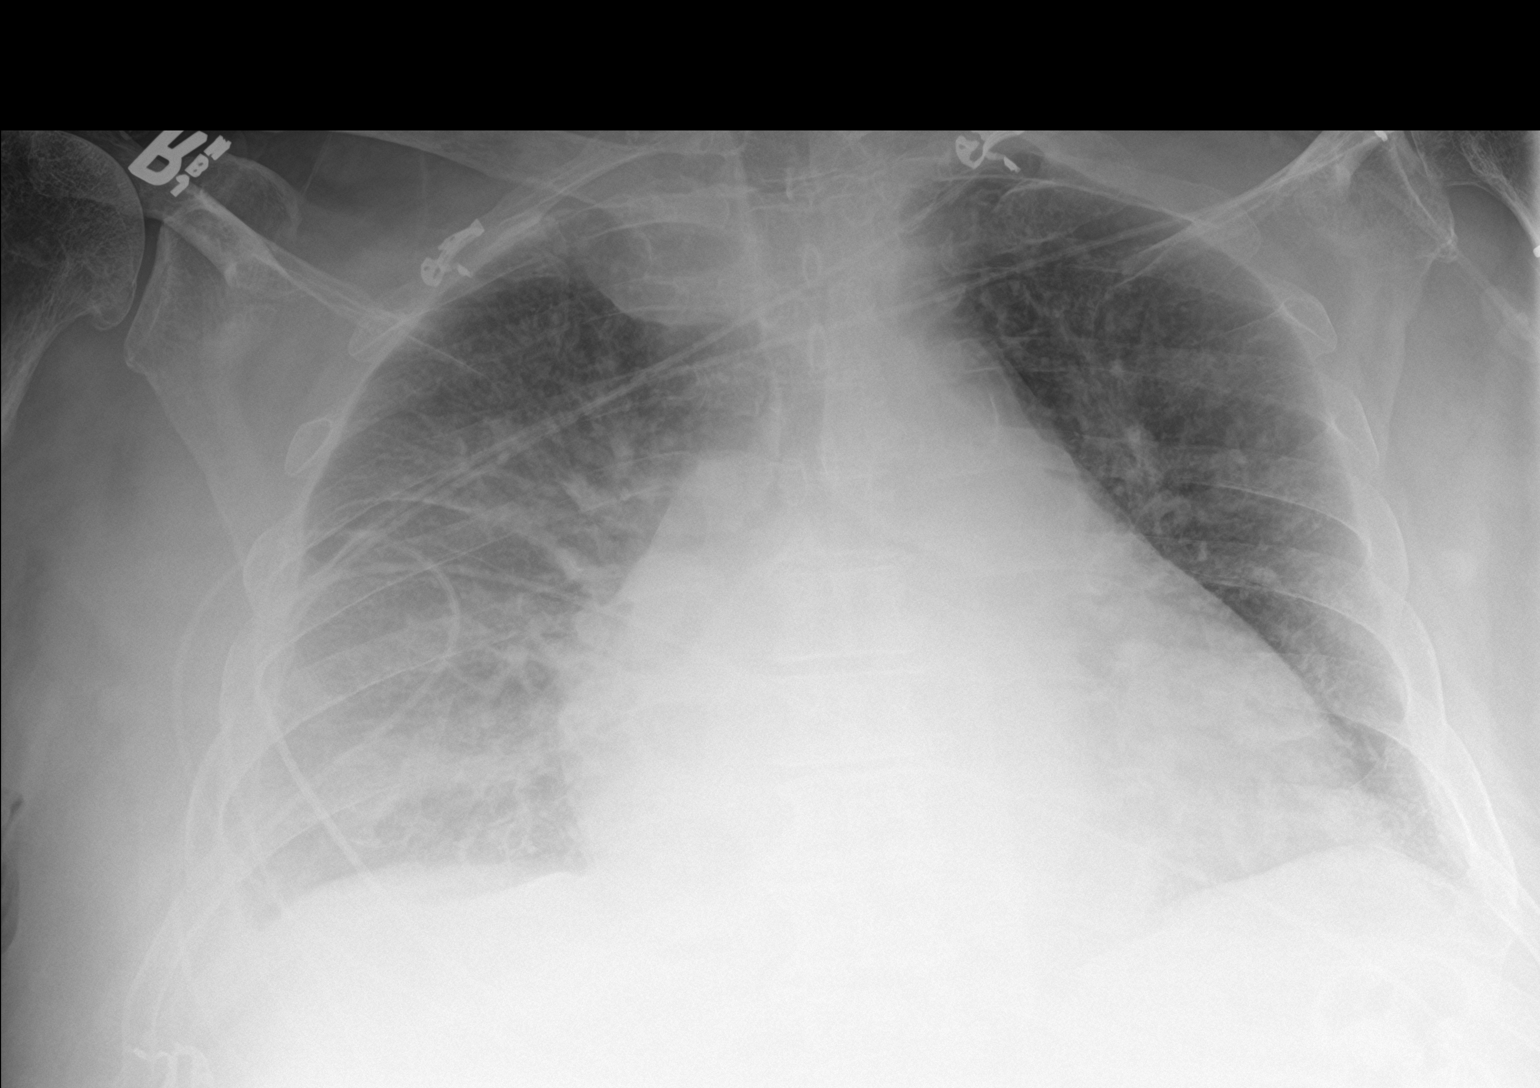
[im 2/2]
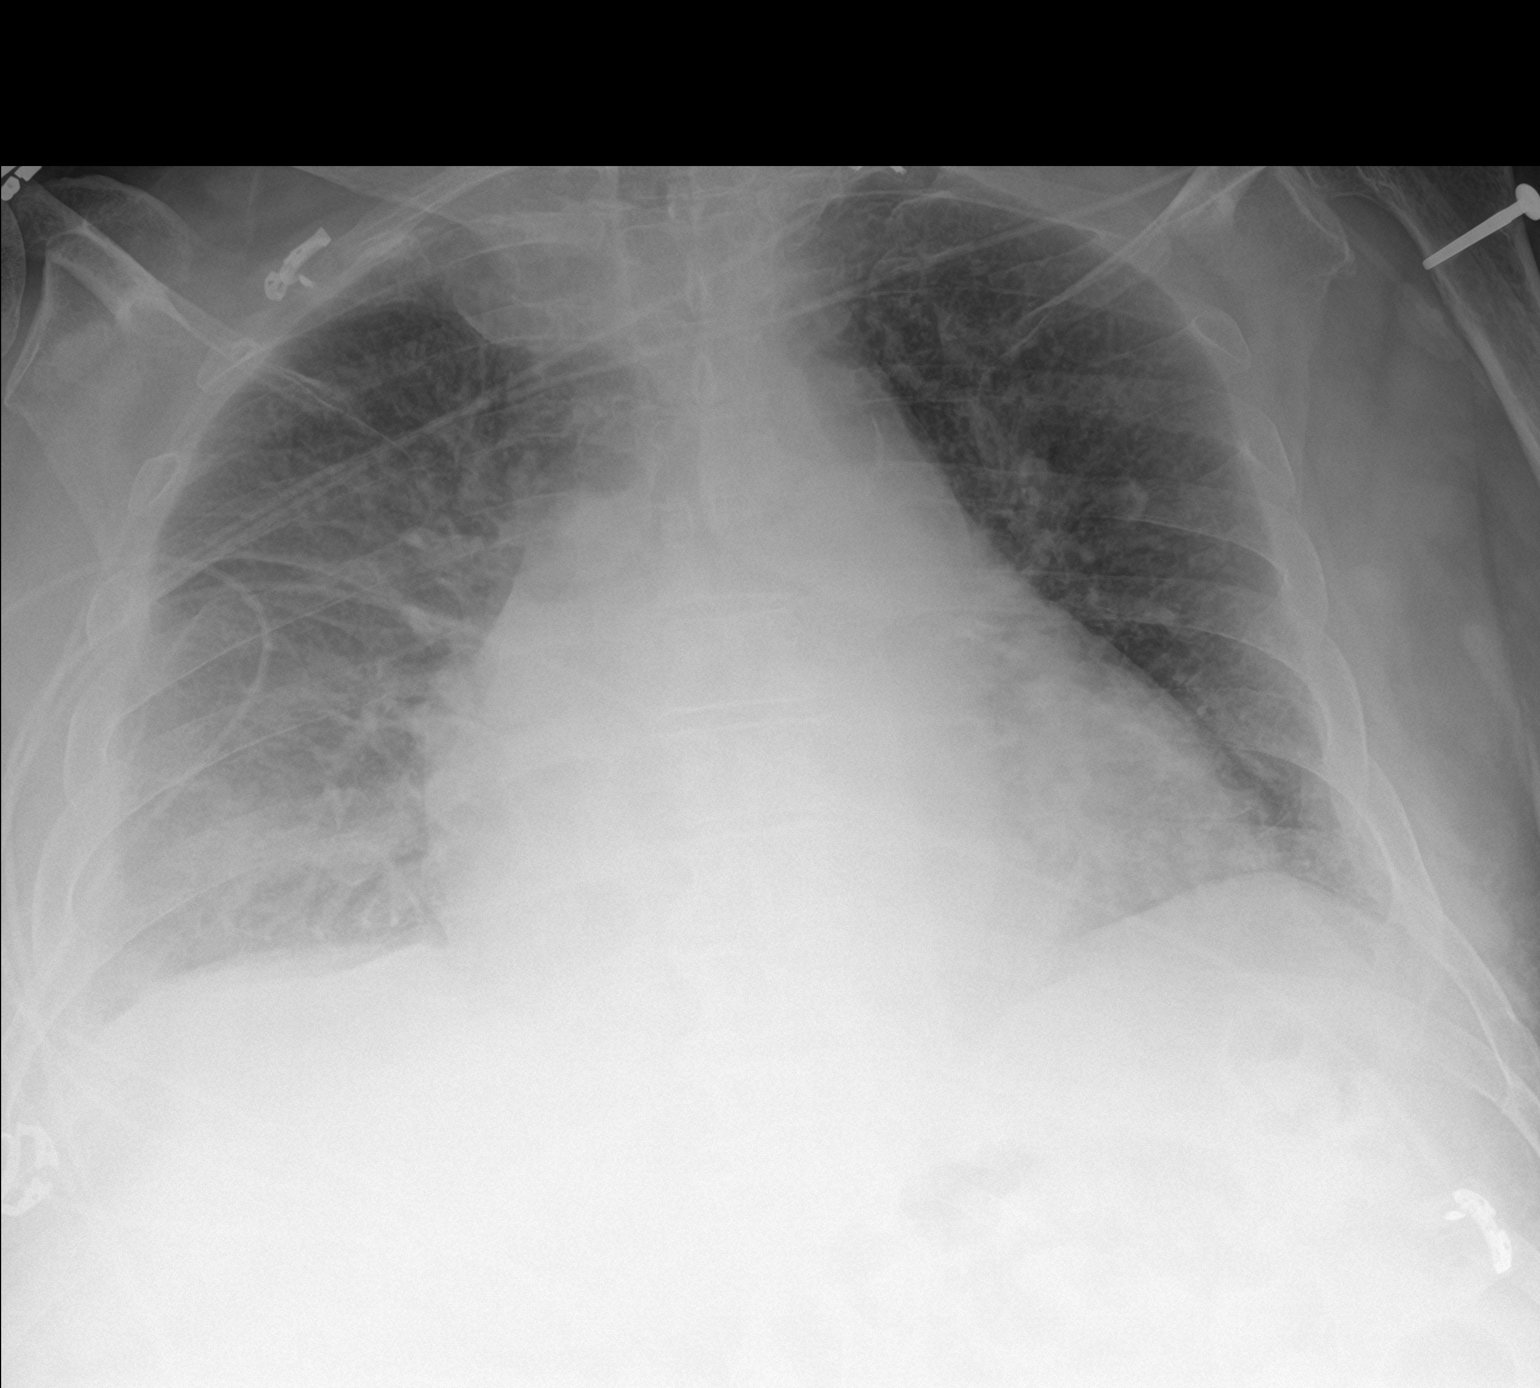

[2 of 2 positions shown; findings below may reference images not displayed]

FINDINGS: Cardiac enlargement. Progression of vascular congestion and
bilateral edema. Progression of right effusion. Bibasilar
atelectasis. Widening of the superior mediastinum compatible with
vascular ectasia.
IMPRESSION: Congestive heart failure with progression of edema and right pleural
effusion. Bibasilar atelectasis.

Limited evaluation of skeletal structures. No displaced rib fracture
identified.

## 2018-06-10 IMAGING — CR DG CHEST 1V PORT
1 series · 1 of 1 positions shown · non-contrast
Comparison: 10/03/2016

CLINICAL DATA: Fever

EXAM:
PORTABLE CHEST 1 VIEW

[AP]
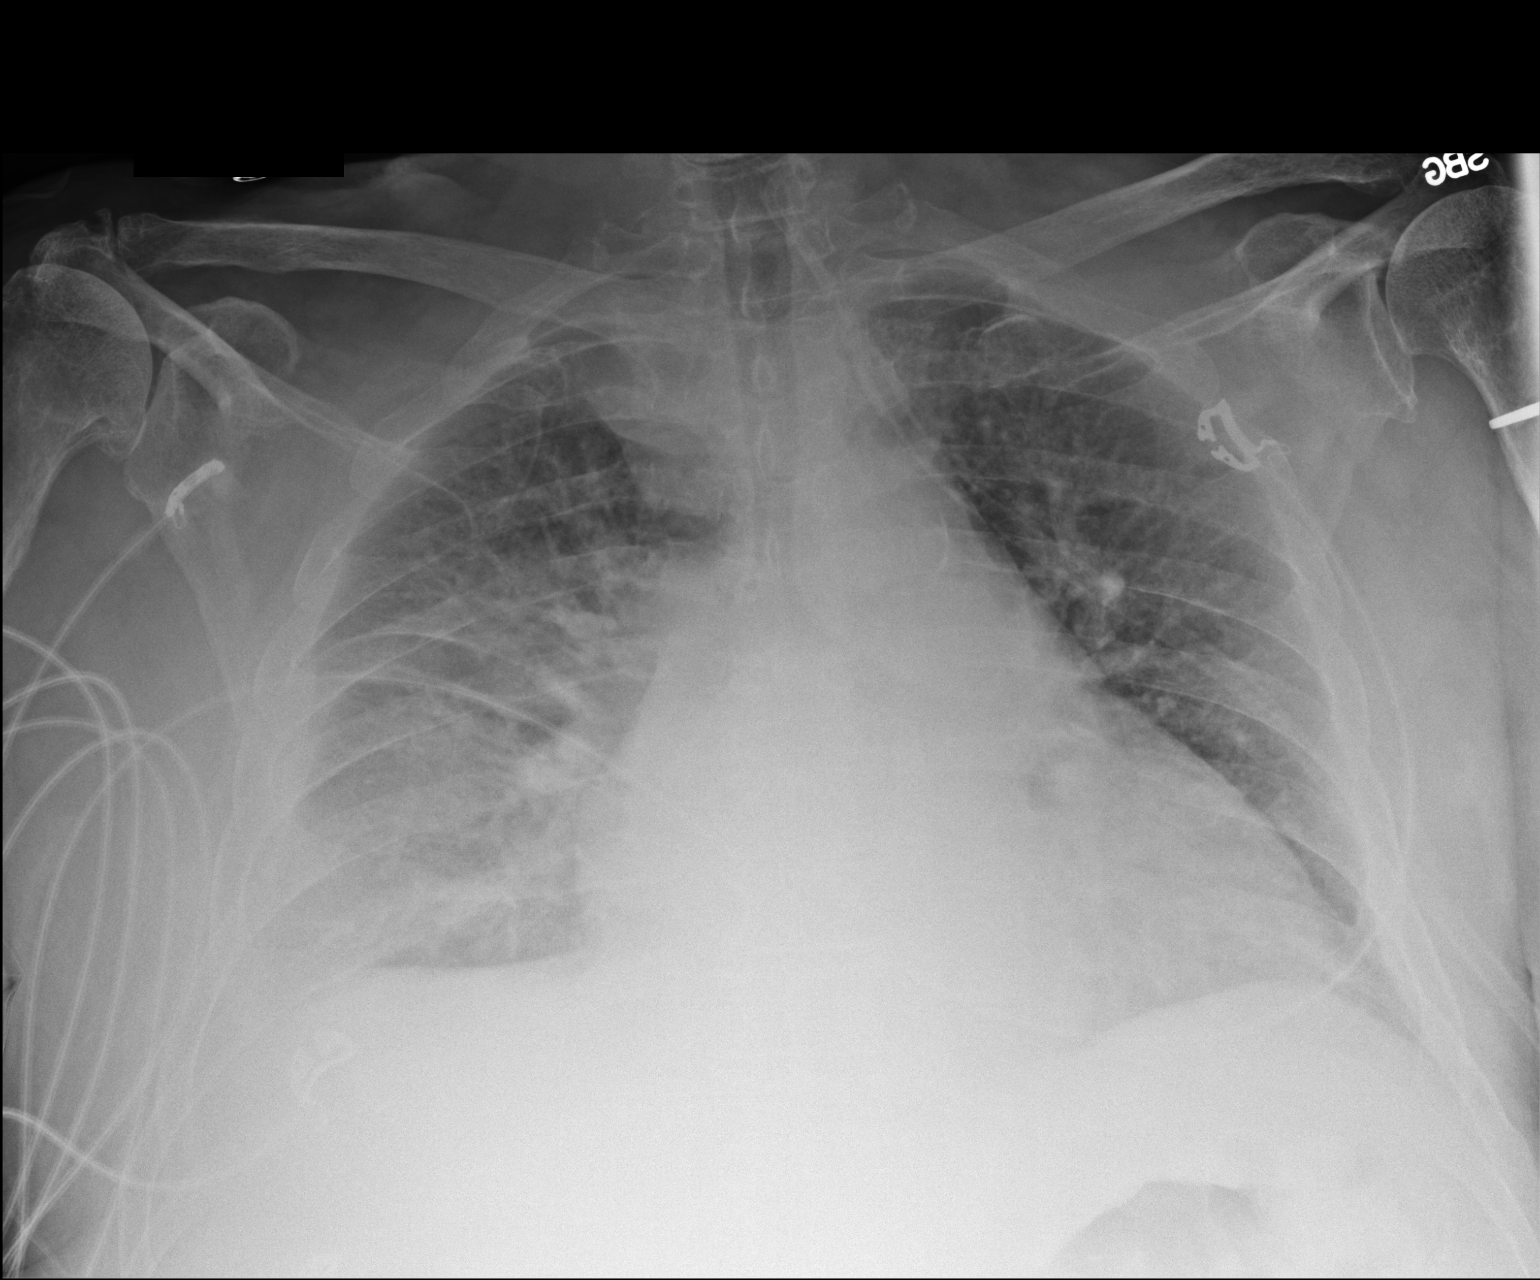

[1 of 1 positions shown; findings below may reference images not displayed]

FINDINGS: Cardiomegaly with vascular congestion. Diffuse interstitial
prominence throughout the lungs. More focal airspace opacity
throughout the right lung with probable small right effusion.
IMPRESSION: Cardiomegaly, possible mild interstitial edema. Focal opacities in
the right could reflect pneumonia. Small right effusion.

## 2018-06-10 MED FILL — NINLARO 3 MG CAPSULE: 3 | 28 days supply | Qty: 3 | Fill #1

## 2018-06-15 ENCOUNTER — Telehealth: Payer: Self-pay | Admitting: Hematology

## 2018-06-15 NOTE — Telephone Encounter (Signed)
Unable to reach patient per 8/27 sch message - left message with appt date and time and sent reminder letter in the mail with appt

## 2018-07-02 ENCOUNTER — Other Ambulatory Visit: Payer: Self-pay | Admitting: Hematology

## 2018-07-02 DIAGNOSIS — C9000 Multiple myeloma not having achieved remission: Secondary | ICD-10-CM

## 2018-07-05 ENCOUNTER — Telehealth: Payer: Self-pay | Admitting: Hematology

## 2018-07-05 ENCOUNTER — Inpatient Hospital Stay: Payer: Medicare HMO | Attending: Hematology | Admitting: Hematology

## 2018-07-05 ENCOUNTER — Inpatient Hospital Stay: Payer: Medicare HMO

## 2018-07-05 VITALS — BP 108/63 | HR 55 | Temp 97.5°F | Resp 18 | Ht 69.0 in | Wt 192.8 lb

## 2018-07-05 DIAGNOSIS — I2510 Atherosclerotic heart disease of native coronary artery without angina pectoris: Secondary | ICD-10-CM | POA: Diagnosis not present

## 2018-07-05 DIAGNOSIS — G4730 Sleep apnea, unspecified: Secondary | ICD-10-CM | POA: Diagnosis not present

## 2018-07-05 DIAGNOSIS — C9000 Multiple myeloma not having achieved remission: Secondary | ICD-10-CM

## 2018-07-05 DIAGNOSIS — Z79899 Other long term (current) drug therapy: Secondary | ICD-10-CM | POA: Insufficient documentation

## 2018-07-05 DIAGNOSIS — R001 Bradycardia, unspecified: Secondary | ICD-10-CM | POA: Diagnosis not present

## 2018-07-05 DIAGNOSIS — M549 Dorsalgia, unspecified: Secondary | ICD-10-CM | POA: Diagnosis not present

## 2018-07-05 DIAGNOSIS — Z87828 Personal history of other (healed) physical injury and trauma: Secondary | ICD-10-CM | POA: Insufficient documentation

## 2018-07-05 DIAGNOSIS — K219 Gastro-esophageal reflux disease without esophagitis: Secondary | ICD-10-CM | POA: Insufficient documentation

## 2018-07-05 DIAGNOSIS — D649 Anemia, unspecified: Secondary | ICD-10-CM | POA: Diagnosis not present

## 2018-07-05 DIAGNOSIS — E538 Deficiency of other specified B group vitamins: Secondary | ICD-10-CM | POA: Diagnosis not present

## 2018-07-05 DIAGNOSIS — Z7901 Long term (current) use of anticoagulants: Secondary | ICD-10-CM | POA: Diagnosis not present

## 2018-07-05 DIAGNOSIS — I4891 Unspecified atrial fibrillation: Secondary | ICD-10-CM | POA: Insufficient documentation

## 2018-07-05 DIAGNOSIS — D6959 Other secondary thrombocytopenia: Secondary | ICD-10-CM | POA: Diagnosis not present

## 2018-07-05 DIAGNOSIS — I10 Essential (primary) hypertension: Secondary | ICD-10-CM

## 2018-07-05 DIAGNOSIS — E7800 Pure hypercholesterolemia, unspecified: Secondary | ICD-10-CM | POA: Insufficient documentation

## 2018-07-05 DIAGNOSIS — M1990 Unspecified osteoarthritis, unspecified site: Secondary | ICD-10-CM | POA: Diagnosis not present

## 2018-07-05 DIAGNOSIS — E1140 Type 2 diabetes mellitus with diabetic neuropathy, unspecified: Secondary | ICD-10-CM | POA: Insufficient documentation

## 2018-07-05 DIAGNOSIS — Z87311 Personal history of (healed) other pathological fracture: Secondary | ICD-10-CM | POA: Diagnosis not present

## 2018-07-05 LAB — CBC WITH DIFFERENTIAL/PLATELET
BASOS ABS: 0 10*3/uL (ref 0.0–0.1)
BASOS PCT: 1 %
EOS PCT: 2 %
Eosinophils Absolute: 0.1 10*3/uL (ref 0.0–0.5)
HEMATOCRIT: 40.1 % (ref 38.4–49.9)
Hemoglobin: 12.9 g/dL — ABNORMAL LOW (ref 13.0–17.1)
LYMPHS ABS: 0.6 10*3/uL — AB (ref 0.9–3.3)
LYMPHS PCT: 12 %
MCH: 29.3 pg (ref 27.2–33.4)
MCHC: 32.3 g/dL (ref 32.0–36.0)
MCV: 90.8 fL (ref 79.3–98.0)
MONOS PCT: 10 %
Monocytes Absolute: 0.5 10*3/uL (ref 0.1–0.9)
Neutro Abs: 3.8 10*3/uL (ref 1.5–6.5)
Neutrophils Relative %: 75 %
PLATELETS: 120 10*3/uL — AB (ref 140–400)
RBC: 4.41 MIL/uL (ref 4.20–5.82)
RDW: 17.6 % — AB (ref 11.0–14.6)
WBC: 5.2 10*3/uL (ref 4.0–10.3)

## 2018-07-05 LAB — CMP (CANCER CENTER ONLY)
ALBUMIN: 3.8 g/dL (ref 3.5–5.0)
ALT: 18 U/L (ref 0–44)
AST: 24 U/L (ref 15–41)
Alkaline Phosphatase: 165 U/L — ABNORMAL HIGH (ref 38–126)
Anion gap: 11 (ref 5–15)
BUN: 29 mg/dL — AB (ref 8–23)
CHLORIDE: 106 mmol/L (ref 98–111)
CO2: 26 mmol/L (ref 22–32)
Calcium: 9.2 mg/dL (ref 8.9–10.3)
Creatinine: 1.59 mg/dL — ABNORMAL HIGH (ref 0.61–1.24)
GFR, Est AFR Am: 49 mL/min — ABNORMAL LOW (ref >60)
GFR, Estimated: 42 mL/min — ABNORMAL LOW (ref >60)
GLUCOSE: 127 mg/dL — AB (ref 70–99)
POTASSIUM: 3.7 mmol/L (ref 3.5–5.1)
Sodium: 143 mmol/L (ref 135–145)
Total Bilirubin: 0.8 mg/dL (ref 0.3–1.2)
Total Protein: 7.1 g/dL (ref 6.5–8.1)

## 2018-07-05 LAB — VITAMIN B12: Vitamin B-12: 573 pg/mL (ref 180–914)

## 2018-07-05 NOTE — Telephone Encounter (Signed)
appts scheduled avs/calendar printed per 9/16 los °
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
## 2018-07-06 LAB — KAPPA/LAMBDA LIGHT CHAINS
KAPPA FREE LGHT CHN: 86.7 mg/L — AB (ref 3.3–19.4)
Kappa, lambda light chain ratio: 7.67 — ABNORMAL HIGH (ref 0.26–1.65)
LAMDA FREE LIGHT CHAINS: 11.3 mg/L (ref 5.7–26.3)

## 2018-07-08 MED FILL — NINLARO 3 MG CAPSULE: 3 | 28 days supply | Qty: 3 | Fill #0

## 2018-07-17 ENCOUNTER — Other Ambulatory Visit: Payer: Self-pay | Admitting: Hematology

## 2018-07-17 DIAGNOSIS — C9000 Multiple myeloma not having achieved remission: Secondary | ICD-10-CM

## 2018-08-02 ENCOUNTER — Inpatient Hospital Stay: Payer: Medicare HMO

## 2018-08-02 ENCOUNTER — Inpatient Hospital Stay: Payer: Medicare HMO | Admitting: Hematology

## 2018-08-02 ENCOUNTER — Encounter: Payer: Self-pay | Admitting: Hematology

## 2018-08-02 ENCOUNTER — Inpatient Hospital Stay: Payer: Medicare HMO | Attending: Hematology

## 2018-08-02 VITALS — BP 101/51 | HR 65 | Temp 97.8°F | Resp 18 | Ht 69.0 in | Wt 197.7 lb

## 2018-08-02 DIAGNOSIS — Z9221 Personal history of antineoplastic chemotherapy: Secondary | ICD-10-CM | POA: Insufficient documentation

## 2018-08-02 DIAGNOSIS — C9000 Multiple myeloma not having achieved remission: Secondary | ICD-10-CM

## 2018-08-02 DIAGNOSIS — I4891 Unspecified atrial fibrillation: Secondary | ICD-10-CM

## 2018-08-02 DIAGNOSIS — I255 Ischemic cardiomyopathy: Secondary | ICD-10-CM | POA: Insufficient documentation

## 2018-08-02 DIAGNOSIS — Z7901 Long term (current) use of anticoagulants: Secondary | ICD-10-CM | POA: Insufficient documentation

## 2018-08-02 DIAGNOSIS — E7800 Pure hypercholesterolemia, unspecified: Secondary | ICD-10-CM | POA: Insufficient documentation

## 2018-08-02 DIAGNOSIS — K219 Gastro-esophageal reflux disease without esophagitis: Secondary | ICD-10-CM | POA: Insufficient documentation

## 2018-08-02 DIAGNOSIS — M1990 Unspecified osteoarthritis, unspecified site: Secondary | ICD-10-CM | POA: Insufficient documentation

## 2018-08-02 DIAGNOSIS — Z7902 Long term (current) use of antithrombotics/antiplatelets: Secondary | ICD-10-CM | POA: Diagnosis not present

## 2018-08-02 DIAGNOSIS — E538 Deficiency of other specified B group vitamins: Secondary | ICD-10-CM | POA: Diagnosis not present

## 2018-08-02 DIAGNOSIS — D6959 Other secondary thrombocytopenia: Secondary | ICD-10-CM | POA: Diagnosis not present

## 2018-08-02 DIAGNOSIS — I2510 Atherosclerotic heart disease of native coronary artery without angina pectoris: Secondary | ICD-10-CM

## 2018-08-02 DIAGNOSIS — R001 Bradycardia, unspecified: Secondary | ICD-10-CM | POA: Diagnosis not present

## 2018-08-02 DIAGNOSIS — Z794 Long term (current) use of insulin: Secondary | ICD-10-CM | POA: Insufficient documentation

## 2018-08-02 DIAGNOSIS — E1140 Type 2 diabetes mellitus with diabetic neuropathy, unspecified: Secondary | ICD-10-CM | POA: Insufficient documentation

## 2018-08-02 DIAGNOSIS — M549 Dorsalgia, unspecified: Secondary | ICD-10-CM | POA: Diagnosis not present

## 2018-08-02 DIAGNOSIS — D649 Anemia, unspecified: Secondary | ICD-10-CM

## 2018-08-02 DIAGNOSIS — Z7189 Other specified counseling: Secondary | ICD-10-CM

## 2018-08-02 DIAGNOSIS — Z79899 Other long term (current) drug therapy: Secondary | ICD-10-CM | POA: Diagnosis not present

## 2018-08-02 DIAGNOSIS — I10 Essential (primary) hypertension: Secondary | ICD-10-CM

## 2018-08-02 LAB — COMPREHENSIVE METABOLIC PANEL
ALK PHOS: 106 U/L (ref 38–126)
ALT: 20 U/L (ref 0–44)
AST: 29 U/L (ref 15–41)
Albumin: 3.9 g/dL (ref 3.5–5.0)
Anion gap: 10 (ref 5–15)
BILIRUBIN TOTAL: 0.6 mg/dL (ref 0.3–1.2)
BUN: 39 mg/dL — ABNORMAL HIGH (ref 8–23)
CALCIUM: 9.2 mg/dL (ref 8.9–10.3)
CO2: 22 mmol/L (ref 22–32)
CREATININE: 1.57 mg/dL — AB (ref 0.61–1.24)
Chloride: 111 mmol/L (ref 98–111)
GFR, EST AFRICAN AMERICAN: 49 mL/min — AB (ref >60)
GFR, EST NON AFRICAN AMERICAN: 43 mL/min — AB (ref >60)
Glucose, Bld: 122 mg/dL — ABNORMAL HIGH (ref 70–99)
Potassium: 4.2 mmol/L (ref 3.5–5.1)
SODIUM: 143 mmol/L (ref 135–145)
Total Protein: 6.7 g/dL (ref 6.5–8.1)

## 2018-08-02 LAB — CBC WITH DIFFERENTIAL/PLATELET
ABS IMMATURE GRANULOCYTES: 0.02 10*3/uL (ref 0.00–0.07)
Basophils Absolute: 0 10*3/uL (ref 0.0–0.1)
Basophils Relative: 0 %
Eosinophils Absolute: 0.1 10*3/uL (ref 0.0–0.5)
Eosinophils Relative: 2 %
HEMATOCRIT: 37.6 % — AB (ref 39.0–52.0)
HEMOGLOBIN: 11.9 g/dL — AB (ref 13.0–17.0)
Immature Granulocytes: 0 %
LYMPHS ABS: 0.5 10*3/uL — AB (ref 0.7–4.0)
Lymphocytes Relative: 10 %
MCH: 29.2 pg (ref 26.0–34.0)
MCHC: 31.6 g/dL (ref 30.0–36.0)
MCV: 92.4 fL (ref 80.0–100.0)
MONO ABS: 0.5 10*3/uL (ref 0.1–1.0)
MONOS PCT: 10 %
NEUTROS ABS: 4 10*3/uL (ref 1.7–7.7)
NEUTROS PCT: 78 %
Platelets: 108 10*3/uL — ABNORMAL LOW (ref 150–400)
RBC: 4.07 MIL/uL — ABNORMAL LOW (ref 4.22–5.81)
RDW: 15.9 % — ABNORMAL HIGH (ref 11.5–15.5)
WBC: 5.2 10*3/uL (ref 4.0–10.5)
nRBC: 0 % (ref 0.0–0.2)

## 2018-08-02 MED ORDER — SODIUM CHLORIDE 0.9 % IV SOLN
60.0000 mg | Freq: Once | INTRAVENOUS | Status: AC
  Administered 2018-08-02: 60 mg via INTRAVENOUS
  Filled 2018-08-02: qty 10

## 2018-08-02 MED ORDER — SODIUM CHLORIDE 0.9 % IV SOLN
INTRAVENOUS | Status: DC
  Administered 2018-08-02: 13:00:00 via INTRAVENOUS
  Filled 2018-08-02: qty 250

## 2018-08-02 NOTE — Progress Notes (Signed)
Richard Vang    HEMATOLOGY/ONCOLOGY CLINIC NOTE  Date of Service: 08/02/18    Patient Care Team: Sandi Mariscal, MD as PCP - General (Internal Medicine)  CHIEF COMPLAINTS/PURPOSE OF CONSULTATION:   F/u for continued management of Myeloma   HISTORY OF PRESENTING ILLNESS:  plz see previous note for details on HPI  INTERVAL HISTORY   Richard Vang is here for his scheduled followup for multiple myeloma. The patient's last visit with [name] was on 07/05/18. The pt reports that he is doing well overall.   The pt reports that the neuropathy in his feet has not changed or worsened. He notes that his back pain has not changed or worsened either, and he has pain mediation available. He denies any difficulty or concerns taking Ninlaro.   The pt notes that his balance has been slowly improving and has been using a cane to ambulate.   Lab results today (08/02/18) of CBC w/diff is as follows: all values are WNL except for RBC at 4.07, HGB at 11.9, HCT at 37.6, RDW at 15.9, PLT at 108k, Lymphs abs at 500. 08/02/18 CMP is pending 08/02/18 SFLC is pending  On review of systems, pt reports stable peripheral neuropathy, stable back pain, stable energy levels, improving balance, moving his bowels well, and denies skin rashes, diarrhea, worsening neuropathy, falls, abdominal pains, leg swelling, and any other symptoms.   MEDICAL HISTORY:  Past Medical History:  Diagnosis Date  . Arthritis    "left shoulder" (07/31/2016)  . Coronary artery disease   . GERD (gastroesophageal reflux disease)   . Heart murmur   . High cholesterol   . Hypertension   . Multiple myeloma ([place])   . Sleep apnea    "probably; having test in November" (07/31/2016)  . Type II diabetes mellitus ([place])     SURGICAL HISTORY: Past Surgical History:  Procedure Laterality Date  . CARDIAC CATHETERIZATION N/A 07/31/2016   Procedure: Left Heart Cath and Coronary Angiography;  Surgeon: Lorretta Harp, MD;  Location: [facility];  Service:  Cardiovascular;  Laterality: N/A;  . CARDIAC CATHETERIZATION N/A 07/31/2016   Procedure: Coronary Stent Intervention;  Surgeon: Lorretta Harp, MD;  Location: [facility];  Service: Cardiovascular;  Laterality: N/A;  . CORONARY ANGIOPLASTY    . OPEN REDUCTION INTERNAL FIXATION (ORIF) METACARPAL Left 05/21/2018   Procedure: OPEN REDUCTION INTERNAL FIXATION (ORIF) 2ND METACARPAL;  Surgeon: Shona Needles, MD;  Location: [facility];  Service: Orthopedics;  Laterality: Left;  . ORIF CLAVICULAR FRACTURE Right 05/21/2018   Procedure: OPEN REDUCTION INTERNAL FIXATION (ORIF) CLAVICULAR FRACTURE;  Surgeon: Shona Needles, MD;  Location: [facility];  Service: Orthopedics;  Laterality: Right;  . SHOULDER SURGERY Left 1973   "put pin in it where it had separated"   . TONSILLECTOMY  ~ 1956    SOCIAL HISTORY: Social History   Socioeconomic History  . Marital status: Divorced    Spouse name: Not on file  . Number of children: Not on file  . Years of education: Not on file  . Highest education level: Not on file  Occupational History  . Not on file  Social Needs  . Financial resource strain: Not on file  . Food insecurity:    Worry: Not on file    Inability: Not on file  . Transportation needs:    Medical: Not on file    Non-medical: Not on file  Tobacco Use  . Smoking status: Never Smoker  . Smokeless tobacco: Never Used  Substance and Sexual Activity  . Alcohol use: Yes    Comment: 07/31/2016 "nothing since 2002"  . Drug use: No  . Sexual activity: Not Currently  Lifestyle  . Physical activity:    Days per week: Not on file    Minutes per session: Not on file  . Stress: Not on file  Relationships  . Social connections:    Talks on phone: Not on file    Gets together: Not on file    Attends religious service: Not on file    Active member of club or organization: Not on file    Attends meetings of clubs or organizations: Not on file    Relationship status: Not on file  . Intimate  partner violence:    Fear of current or ex partner: Not on file    Emotionally abused: Not on file    Physically abused: Not on file    Forced sexual activity: Not on file  Other Topics Concern  . Not on file  Social History Narrative  . Not on file    FAMILY HISTORY: Family History  Problem Relation Age of Onset  . Hypertension Other     ALLERGIES:  has No Known Allergies.  MEDICATIONS:  Current Outpatient Medications  Medication Sig Dispense Refill  . acyclovir (ZOVIRAX) 400 MG tablet TAKE 1 TABLET BY MOUTH TWICE DAILY 60 tablet 3  . amLODipine (NORVASC) 5 MG tablet Take 5 mg by mouth daily.    Richard Vang atorvastatin (LIPITOR) 40 MG tablet Take 1 tablet (40 mg total) by mouth at bedtime. 30 tablet 12  . carvedilol (COREG) 12.5 MG tablet Take 12.5 mg by mouth 2 (two) times daily with a meal.    . clopidogrel (PLAVIX) 75 MG tablet Take 1 tablet (75 mg total) by mouth daily with breakfast. 30 tablet 12  . Cyanocobalamin (VITAMIN B-12) 2500 MCG SUBL Place 1 tablet under the tongue daily with breakfast.    . cyclobenzaprine (FLEXERIL) 10 MG tablet Take 10 mg by mouth daily as needed for pain.  2  . ergocalciferol (VITAMIN D2) 50000 units capsule Take 1 capsule (50,000 Units total) by mouth once a week. 12 capsule 1  . furosemide (LASIX) 40 MG tablet Take 40 mg by mouth daily with breakfast.     . insulin aspart (NOVOLOG) 100 UNIT/ML injection Inject 0-9 Units into the skin every 4 (four) hours. 10 mL 11  . NINLARO 3 MG capsule TAKE 1 CAPSULE (3 MG TOTAL) BY MOUTH ONCE A WEEK. ON DAY 1,8,15 EVERY 28 DAYS. TAKE ON AN EMPTY STOMACH 1HR BEFORE OR 2HRS AFTER FOOD. 3 capsule 1  . nitroGLYCERIN (NITROSTAT) 0.3 MG SL tablet Place 0.3 mg under the tongue every 5 (five) minutes as needed for chest pain.     Richard Vang omeprazole (PRILOSEC) 40 MG capsule Take 40 mg by mouth daily with breakfast.     . ondansetron (ZOFRAN) 8 MG tablet TAKE 1 TABLET BY MOUTH TWICE DAILY AS NEEDED FOR  REFRACTORY NAUSEA/VOMITING.  STARTING ON DAYS  4 AND 11 NOT ON DAY 8 30 tablet 1  . oxyCODONE-acetaminophen (PERCOCET) 10-325 MG tablet Take 1 tablet by mouth 2 (two) times daily. 10 tablet 0  . potassium chloride SA (K-DUR,KLOR-CON) 20 MEQ tablet Take 1 tablet (20 mEq total) by mouth daily. (Patient taking differently: Take 20 mEq by mouth daily with breakfast. )    . prochlorperazine (COMPAZINE) 10 MG tablet Take 1 tablet (10 mg total) by mouth every 6 (six) hours  as needed (Nausea or vomiting). (Patient taking differently: Take 10 mg by mouth every 6 (six) hours as needed for nausea or vomiting. ) 30 tablet 1  . tamsulosin (FLOMAX) 0.4 MG CAPS capsule Take 1 capsule (0.4 mg total) by mouth daily after supper. 30 capsule 0  . warfarin (COUMADIN) 4 MG tablet Take 1.5 tablets (6 mg total) by mouth daily. 30 tablet 0   No current facility-administered medications for this visit.    Facility-Administered Medications Ordered in Other Visits  Medication Dose Route Frequency Provider Last Rate Last Dose  . 0.9 %  sodium chloride infusion   Intravenous Continuous Brunetta Genera, MD 10 mL/hr at 07/09/17 1423      REVIEW OF SYSTEMS:    A 10+ POINT REVIEW OF SYSTEMS WAS OBTAINED including neurology, dermatology, psychiatry, cardiac, respiratory, lymph, extremities, GI, GU, Musculoskeletal, constitutional, breasts, reproductive, HEENT.  All pertinent positives are noted in the HPI.  All others are negative.    PHYSICAL EXAMINATION:  ECOG PERFORMANCE STATUS: 2 VS reviewed  GENERAL:alert, in no acute distress and comfortable SKIN: no acute rashes, no significant lesions EYES: conjunctiva are pink and non-injected, sclera anicteric OROPHARYNX: MMM, no exudates, no oropharyngeal erythema or ulceration NECK: supple, no JVD LYMPH:  no palpable lymphadenopathy in the cervical, axillary or inguinal regions LUNGS: clear to auscultation b/l with normal respiratory effort HEART: regular rate & rhythm ABDOMEN:  normoactive bowel  sounds , non tender, not distended. No palpable hepatosplenomegaly.  Extremity: no pedal edema PSYCH: alert & oriented x 3 with fluent speech NEURO: no focal motor/sensory deficits   LABORATORY DATA:  I have reviewed the data as listed . CBC Latest Ref Rng & Units 08/02/2018 07/05/2018 05/27/2018  WBC 4.0 - 10.5 K/uL 5.2 5.2 5.4  Hemoglobin 13.0 - 17.0 g/dL 11.9(L) 12.9(L) 10.6(L)  Hematocrit 39.0 - 52.0 % 37.6(L) 40.1 34.5(L)  Platelets 150 - 400 K/uL 108(L) 120(L) 114(L)   . CBC    Component Value Date/Time   WBC 5.2 08/02/2018 1018   RBC 4.07 (L) 08/02/2018 1018   HGB 11.9 (L) 08/02/2018 1018   HGB 10.5 (L) 02/25/2018 1219   HGB 9.2 (L) 10/01/2017 1112   HCT 37.6 (L) 08/02/2018 1018   HCT 29.3 (L) 10/01/2017 1112   PLT 108 (L) 08/02/2018 1018   PLT 132 (L) 02/25/2018 1219   PLT 170 10/01/2017 1112   MCV 92.4 08/02/2018 1018   MCV 88.5 10/01/2017 1112   MCH 29.2 08/02/2018 1018   MCHC 31.6 08/02/2018 1018   RDW 15.9 (H) 08/02/2018 1018   RDW 15.3 (H) 10/01/2017 1112   LYMPHSABS 0.5 (L) 08/02/2018 1018   LYMPHSABS 0.5 (L) 10/01/2017 1112   MONOABS 0.5 08/02/2018 1018   MONOABS 0.5 10/01/2017 1112   EOSABS 0.1 08/02/2018 1018   EOSABS 0.1 10/01/2017 1112   BASOSABS 0.0 08/02/2018 1018   BASOSABS 0.0 10/01/2017 1112    . CMP Latest Ref Rng & Units 08/02/2018 07/05/2018 05/26/2018  Glucose 70 - 99 mg/dL 122(H) 127(H) 118(H)  BUN 8 - 23 mg/dL 39(H) 29(H) 28(H)  Creatinine 0.61 - 1.24 mg/dL 1.57(H) 1.59(H) 1.31(H)  Sodium 135 - 145 mmol/L 143 143 142  Potassium 3.5 - 5.1 mmol/L 4.2 3.7 3.8  Chloride 98 - 111 mmol/L 111 106 107  CO2 22 - 32 mmol/L '22 26 26  '$ Calcium 8.9 - 10.3 mg/dL 9.2 9.2 8.6(L)  Total Protein 6.5 - 8.1 g/dL 6.7 7.1 -  Total Bilirubin 0.3 - 1.2 mg/dL 0.6 0.8 -  Alkaline Phos 38 - 126 U/L 106 165(H) -  AST 15 - 41 U/L 29 24 -  ALT 0 - 44 U/L 20 18 -         RADIOGRAPHIC STUDIES: I have personally reviewed the radiological images as listed and  agreed with the findings in the report. No results found.  ASSESSMENT & PLAN:   72 y.o. 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  K/L 95, No M spike on SPEP -- suggests light chain MM Cytogenetics - normal male chromosomes FISH- +11 and 13q-/-13  Patient is s/p 1 cycle of Vd and Serum free kappa LC has decreased from 700 to 203.6 with improvement in his K/L ratio from 94.59 to 29. Completed 2nd cycle of treatment with Vd + Cytoxan (225m/m2) Completed 3rd cycle of treatment with Vd + Cytoxan (2049mm2) - cytoxan held D15 due to thrombocytopenia Completed 4th of VCd Discontinued VCd after C5D8 due to intolerance with diarrhea and increasing neuropathy and fatigue with borderline functional status. -Patient did not tolerate maintenance Revlimid 10 mg by mouth daily due to grade 2-3 diarrhea. This was discontinued and his diarrhea has resolved.  Currently on maintenance Ninlaro M spike absent   PLAN:  -tolerating maintenance  Ninlaro 44m80m1/8/15 q28days  -Continue B12 replacement -Continue acyclovir for shingles prophylaxis. -Continue aspirin -Advised that the pt not lift anything heavy -Encouraged pt to apply sun screen as his skin will be more sensitive to sun exposure.  -Recommended that pt continue to discuss back pain with PCP and consider orthopedic evaluation -Discussed pt labwork today, 08/02/18; blood counts are stable  -The pt has no prohibitive toxicities  from continuing Ninlaro at this time.   -Continue Aredia 8 weeks -Will see the pt back in 2 months            2) CAD s/p prox LAD DES PCI on 07/31/2016 with ischemic cardiomyopathy ejection fraction of 25-35%. Rpt ECHO on 10/03/2016 shows improvement in EF to 55-60% with no RWMABN.  3) Macrocytic Anemia with moderate thrombocytopenia - due to MM and and Ninlaro. Stable.  4) Thrombocytopenia - due to MM and treatment --improved today 108k. --will monitor  5) B12 deficiency - Plan -cont SL B12 1000m21mo daily  6) h/o Afib with RVR on coumadin Some bradycardia with BB -continue mx per PCP and cardiology  7) Back pain -- multiple chronic compression fractures with some worsening -on bisphosphonates already -Ergocalciferol 50k units weekly   -Continue Ninlaro -continue Pamidronate q8 weeks  -RTC with Dr KaleIrene Limbo8 weeks with labs    All of the patients questions were answered with apparent satisfaction. The patient knows to call the clinic with any problems, questions or concerns.  The total time spent in the appt was 25 minutes and more than 50% was on counseling and direct patient cares.    GautSullivan LoneMS AOak HillIVMS [facility] Patology/Oncology Physician [facility]):       [phone] cell):  [phone]  [phone]  I, Baldwin [place], am acting as a scribe for Dr. Irene Limbo  .I have reviewed the above documentation for accuracy and completeness, and I agree with the above. Brunetta Genera MD

## 2018-08-02 NOTE — Patient Instructions (Signed)
[facility] Discharge Instructions for Patients Receiving Chemotherapy  Today you received the following chemotherapy agents Aredia.  To help prevent nausea and vomiting after your treatment, we encourage you to take your nausea medication as directed.  If you develop nausea and vomiting that is not controlled by your nausea medication, call the clinic.   BELOW ARE SYMPTOMS THAT SHOULD BE REPORTED IMMEDIATELY:  *FEVER GREATER THAN 100.5 F  *CHILLS WITH OR WITHOUT FEVER  NAUSEA AND VOMITING THAT IS NOT CONTROLLED WITH YOUR NAUSEA MEDICATION  *UNUSUAL SHORTNESS OF BREATH  *UNUSUAL BRUISING OR BLEEDING  TENDERNESS IN MOUTH AND THROAT WITH OR WITHOUT PRESENCE OF ULCERS  *URINARY PROBLEMS  *BOWEL PROBLEMS  UNUSUAL RASH Items with * indicate a potential emergency and should be followed up as soon as possible.  Feel free to call the clinic should you have any questions or concerns. The clinic phone number is [phone].  Please show the [place] at check-in to the Emergency Department and triage nurse.

## 2018-08-03 ENCOUNTER — Telehealth: Payer: Self-pay

## 2018-08-03 LAB — KAPPA/LAMBDA LIGHT CHAINS
Kappa free light chain: 71.2 mg/L — ABNORMAL HIGH (ref 3.3–19.4)
Kappa, lambda light chain ratio: 6.59 — ABNORMAL HIGH (ref 0.26–1.65)
Lambda free light chains: 10.8 mg/L (ref 5.7–26.3)

## 2018-08-03 NOTE — Telephone Encounter (Signed)
Left a detailed msg concerning the patient upcoming appointments. Per 10/14 los.  mailed a letter with a calender enclosed

## 2018-08-06 ENCOUNTER — Telehealth: Payer: Self-pay

## 2018-08-06 MED FILL — NINLARO 3 MG CAPSULE: 3 | 28 days supply | Qty: 3 | Fill #1

## 2018-08-06 NOTE — Telephone Encounter (Signed)
Oral Oncology Patient Advocate Encounter  [place] could not get in touch with the patient to refill Ninlaro dispite calling 3 times and leaving voicemails. I called and spoke to his wife and we arranged for the medicine to be filled and shipped today for it to arrive at the patients home on Tuesday. He will start taking it again on Tuesday.  [place] Patient [place] Phone [phone] Fax [phone]

## 2018-08-09 ENCOUNTER — Other Ambulatory Visit: Payer: Self-pay | Admitting: Hematology

## 2018-08-11 ENCOUNTER — Telehealth: Payer: Self-pay

## 2018-08-11 NOTE — Telephone Encounter (Signed)
Oral Oncology Patient Advocate Encounter  I was notified by Patient [employer] (PAF) that the current grant needed to be renewed. I was able to renew this grant 07/28/18-08/12/2019 for $11,000. The grant information is as follows and has been shared with [place].  BIN: [id number] PCN: [id number] Group: [id number] ID: [id number]  [place] Patient [place] Phone [phone] Fax [phone]

## 2018-08-27 ENCOUNTER — Other Ambulatory Visit: Payer: Self-pay | Admitting: Hematology

## 2018-08-27 DIAGNOSIS — C9000 Multiple myeloma not having achieved remission: Secondary | ICD-10-CM

## 2018-09-02 MED FILL — NINLARO 3 MG CAPSULE: 3 | 28 days supply | Qty: 3 | Fill #0

## 2018-09-24 NOTE — Progress Notes (Signed)
[name]    HEMATOLOGY/ONCOLOGY CLINIC NOTE  Date of Service: 09/27/18    Patient Care Team: Sandi Mariscal, MD as PCP - General (Internal Medicine)  CHIEF COMPLAINTS/PURPOSE OF CONSULTATION:   F/u for continued management of Myeloma   HISTORY OF PRESENTING ILLNESS:  plz see previous note for details on HPI  INTERVAL HISTORY   Richard Vang is here for his scheduled followup for multiple myeloma. The patient's last visit with [name] was on 08/02/18. The pt reports that he is doing well overall.   The pt reports that he has been having stable lower back pain in the interim. He denies ever seeing a spine surgeon or orthopedist. The patient does note that his back pain is inhibiting his ability to ambulate well.   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   He notes that he has continued eating well and endorses stable energy levels. The patient denies any problems taking Ninlaro and also denies any concerns for infections.   Lab results today (09/27/18) of CBC w/diff and CMP is as follows: all values are WNL except for HGB at 12.7, RDW at 15.8, PLT at 82k, Glucose at 109, BUN at 30, Creatinine at 1.54, GFR at 45. 09/27/18 SFLC are pending  09/27/18 Vitamin D is pending   On review of systems, pt reports eating well, stable energy levels, stable lower back pain, and denies new bone pains, fevers, chills, night sweats, concerns for infections, and any other symptoms.   MEDICAL HISTORY:  Past Medical History:  Diagnosis Date  . Arthritis    "left shoulder" (07/31/2016)  . Coronary artery disease   . GERD (gastroesophageal reflux disease)   . Heart murmur   . High cholesterol   . Hypertension   . Multiple myeloma ([place])   . Sleep apnea    "probably; having test in November" (07/31/2016)  . Type II diabetes mellitus ([place])     SURGICAL HISTORY: Past Surgical History:  Procedure Laterality Date  . CARDIAC CATHETERIZATION N/A  07/31/2016   Procedure: Left Heart Cath and Coronary Angiography;  Surgeon: Lorretta Harp, MD;  Location: [facility];  Service: Cardiovascular;  Laterality: N/A;  . CARDIAC CATHETERIZATION N/A 07/31/2016   Procedure: Coronary Stent Intervention;  Surgeon: Lorretta Harp, MD;  Location: [facility];  Service: Cardiovascular;  Laterality: N/A;  . CORONARY ANGIOPLASTY    . OPEN REDUCTION INTERNAL FIXATION (ORIF) METACARPAL Left 05/21/2018   Procedure: OPEN REDUCTION INTERNAL FIXATION (ORIF) 2ND METACARPAL;  Surgeon: Shona Needles, MD;  Location: [facility];  Service: Orthopedics;  Laterality: Left;  . ORIF CLAVICULAR FRACTURE Right 05/21/2018   Procedure: OPEN REDUCTION INTERNAL FIXATION (ORIF) CLAVICULAR FRACTURE;  Surgeon: Shona Needles, MD;  Location: [facility];  Service: Orthopedics;  Laterality: Right;  . SHOULDER SURGERY Left 1973   "put pin in it where it had separated"   . TONSILLECTOMY  ~ 1956    SOCIAL HISTORY: Social History   Socioeconomic History  . Marital status: Divorced    Spouse name: Not on file  . Number of children: Not on file  . Years of education: Not on file  . Highest education level: Not on file  Occupational History  . Not on file  Social Needs  . Financial resource strain: Not on file  . Food insecurity:    Worry: Not on file    Inability: Not on file  . Transportation  needs:    Medical: Not on file    Non-medical: Not on file  Tobacco Use  . Smoking status: Never Smoker  . Smokeless tobacco: Never Used  Substance and Sexual Activity  . Alcohol use: Yes    Comment: 07/31/2016 "nothing since 2002"  . Drug use: No  . Sexual activity: Not Currently  Lifestyle  . Physical activity:    Days per week: Not on file    Minutes per session: Not on file  . Stress: Not on file  Relationships  . Social connections:    Talks on phone: Not on file    Gets together: Not on file    Attends religious service: Not on file    Active member of club  or organization: Not on file    Attends meetings of clubs or organizations: Not on file    Relationship status: Not on file  . Intimate partner violence:    Fear of current or ex partner: Not on file    Emotionally abused: Not on file    Physically abused: Not on file    Forced sexual activity: Not on file  Other Topics Concern  . Not on file  Social History Narrative  . Not on file    FAMILY HISTORY: Family History  Problem Relation Age of Onset  . Hypertension Other     ALLERGIES:  has No Known Allergies.  MEDICATIONS:  Current Outpatient Medications  Medication Sig Dispense Refill  . acyclovir (ZOVIRAX) 400 MG tablet TAKE 1 TABLET BY MOUTH TWICE DAILY 60 tablet 3  . amLODipine (NORVASC) 5 MG tablet Take 5 mg by mouth daily.    [name] atorvastatin (LIPITOR) 40 MG tablet Take 1 tablet (40 mg total) by mouth at bedtime. 30 tablet 12  . carvedilol (COREG) 12.5 MG tablet Take 12.5 mg by mouth 2 (two) times daily with a meal.    . clopidogrel (PLAVIX) 75 MG tablet Take 1 tablet (75 mg total) by mouth daily with breakfast. 30 tablet 12  . Cyanocobalamin (VITAMIN B-12) 2500 MCG SUBL Place 1 tablet under the tongue daily with breakfast.    . cyclobenzaprine (FLEXERIL) 10 MG tablet Take 10 mg by mouth daily as needed for pain.  2  . furosemide (LASIX) 40 MG tablet Take 40 mg by mouth daily with breakfast.     . insulin aspart (NOVOLOG) 100 UNIT/ML injection Inject 0-9 Units into the skin every 4 (four) hours. 10 mL 11  . NINLARO 3 MG capsule TAKE 1 CAPSULE (3 MG TOTAL) BY MOUTH ONCE A WEEK. ON DAY 1,8,15 EVERY 28 DAYS. TAKE ON AN EMPTY STOMACH 1HR BEFORE OR 2HRS AFTER FOOD. 3 capsule 1  . nitroGLYCERIN (NITROSTAT) 0.3 MG SL tablet Place 0.3 mg under the tongue every 5 (five) minutes as needed for chest pain.     [name] omeprazole (PRILOSEC) 40 MG capsule Take 40 mg by mouth daily with breakfast.     . ondansetron (ZOFRAN) 8 MG tablet TAKE 1 TABLET BY MOUTH TWICE DAILY AS NEEDED FOR  REFRACTORY  NAUSEA/VOMITING. STARTING ON DAYS  4 AND 11 NOT ON DAY 8 30 tablet 1  . oxyCODONE-acetaminophen (PERCOCET) 10-325 MG tablet Take 1 tablet by mouth 2 (two) times daily. 10 tablet 0  . potassium chloride SA (K-DUR,KLOR-CON) 20 MEQ tablet Take 1 tablet (20 mEq total) by mouth daily. (Patient taking differently: Take 20 mEq by mouth daily with breakfast. )    . prochlorperazine (COMPAZINE) 10 MG tablet Take 1 tablet (  10 mg total) by mouth every 6 (six) hours as needed (Nausea or vomiting). (Patient taking differently: Take 10 mg by mouth every 6 (six) hours as needed for nausea or vomiting. ) 30 tablet 1  . tamsulosin (FLOMAX) 0.4 MG CAPS capsule Take 1 capsule (0.4 mg total) by mouth daily after supper. 30 capsule 0  . Vitamin D, Ergocalciferol, (DRISDOL) 50000 units CAPS capsule TAKE 1 CAPSULE BY MOUTH ONCE A WEEK 12 capsule 1  . warfarin (COUMADIN) 4 MG tablet Take 1.5 tablets (6 mg total) by mouth daily. 30 tablet 0   No current facility-administered medications for this visit.    Facility-Administered Medications Ordered in Other Visits  Medication Dose Route Frequency Provider Last Rate Last Dose  . 0.9 %  sodium chloride infusion   Intravenous Continuous Brunetta Genera, MD 10 mL/hr at 07/09/17 1423      REVIEW OF SYSTEMS:    A 10+ POINT REVIEW OF SYSTEMS WAS OBTAINED including neurology, dermatology, psychiatry, cardiac, respiratory, lymph, extremities, GI, GU, Musculoskeletal, constitutional, breasts, reproductive, HEENT.  All pertinent positives are noted in the HPI.  All others are negative.   PHYSICAL EXAMINATION:  ECOG PERFORMANCE STATUS: 2 VS reviewed  GENERAL:alert, in no acute distress and comfortable SKIN: no acute rashes, no significant lesions EYES: conjunctiva are pink and non-injected, sclera anicteric OROPHARYNX: MMM, no exudates, no oropharyngeal erythema or ulceration NECK: supple, no JVD LYMPH:  no palpable lymphadenopathy in the cervical, axillary or inguinal  regions LUNGS: clear to auscultation b/l with normal respiratory effort HEART: regular rate & rhythm ABDOMEN:  normoactive bowel sounds , non tender, not distended. No palpable hepatosplenomegaly.  Extremity: no pedal edema PSYCH: alert & oriented x 3 with fluent speech NEURO: no focal motor/sensory deficits   LABORATORY DATA:  I have reviewed the data as listed . CBC Latest Ref Rng & Units 09/27/2018 08/02/2018 07/05/2018  WBC 4.0 - 10.5 K/uL 4.4 5.2 5.2  Hemoglobin 13.0 - 17.0 g/dL 12.7(L) 11.9(L) 12.9(L)  Hematocrit 39.0 - 52.0 % 40.4 37.6(L) 40.1  Platelets 150 - 400 K/uL 82(L) 108(L) 120(L)   . CBC    Component Value Date/Time   WBC 4.4 09/27/2018 0854   RBC 4.33 09/27/2018 0854   HGB 12.7 (L) 09/27/2018 0854   HGB 10.5 (L) 02/25/2018 1219   HGB 9.2 (L) 10/01/2017 1112   HCT 40.4 09/27/2018 0854   HCT 29.3 (L) 10/01/2017 1112   PLT 82 (L) 09/27/2018 0854   PLT 132 (L) 02/25/2018 1219   PLT 170 10/01/2017 1112   MCV 93.3 09/27/2018 0854   MCV 88.5 10/01/2017 1112   MCH 29.3 09/27/2018 0854   MCHC 31.4 09/27/2018 0854   RDW 15.8 (H) 09/27/2018 0854   RDW 15.3 (H) 10/01/2017 1112   LYMPHSABS 0.7 09/27/2018 0854   LYMPHSABS 0.5 (L) 10/01/2017 1112   MONOABS 0.4 09/27/2018 0854   MONOABS 0.5 10/01/2017 1112   EOSABS 0.2 09/27/2018 0854   EOSABS 0.1 10/01/2017 1112   BASOSABS 0.0 09/27/2018 0854   BASOSABS 0.0 10/01/2017 1112    . CMP Latest Ref Rng & Units 09/27/2018 08/02/2018 07/05/2018  Glucose 70 - 99 mg/dL 109(H) 122(H) 127(H)  BUN 8 - 23 mg/dL 30(H) 39(H) 29(H)  Creatinine 0.61 - 1.24 mg/dL 1.54(H) 1.57(H) 1.59(H)  Sodium 135 - 145 mmol/L 142 143 143  Potassium 3.5 - 5.1 mmol/L 4.3 4.2 3.7  Chloride 98 - 111 mmol/L 108 111 106  CO2 22 - 32 mmol/L '24 22 26  ' Calcium  8.9 - 10.3 mg/dL 9.0 9.2 9.2  Total Protein 6.5 - 8.1 g/dL 6.9 6.7 7.1  Total Bilirubin 0.3 - 1.2 mg/dL 0.7 0.6 0.8  Alkaline Phos 38 - 126 U/L 114 106 165(H)  AST 15 - 41 U/L 33 29 24  ALT 0 -  44 U/L '22 20 18         ' RADIOGRAPHIC STUDIES: I have personally reviewed the radiological images as listed and agreed with the findings in the report. No results found.  ASSESSMENT & PLAN:   72 y.o. 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  K/L 95, No M spike on SPEP -- suggests light chain MM Cytogenetics - normal male chromosomes FISH- +11 and 13q-/-13  Patient is s/p 1 cycle of Vd and Serum free kappa LC has decreased from 700 to 203.6 with improvement in his K/L ratio from 94.59 to 29. Completed 2nd cycle of treatment with Vd + Cytoxan (238m/m2) Completed 3rd cycle of treatment with Vd + Cytoxan (2080mm2) - cytoxan held D15 due to thrombocytopenia Completed 4th of VCd Discontinued VCd after C5D8 due to intolerance with diarrhea and increasing neuropathy and fatigue with borderline functional status. -Patient did not tolerate maintenance Revlimid 10 mg by mouth daily due to grade 2-3 diarrhea. This was discontinued and his diarrhea has resolved.  Currently on maintenance Ninlaro M spike absent   PLAN:  -Discussed pt labwork today, 09/27/18; blood counts and chemistries are stable  -Last available SFLC from 08/02/18 which revealed Kappa stable at 71.2 and K:L ratio at 6.59 -Discussed the option to get a referral to see an orthopedist regarding his continued back pain, suspecting spinal stenosis and degenerative disease, as opposed to just due to  myeloma. -Will order CT Lumbrosacral, to rule out myeloma etiology of lower back pain, before sending referral  -Advised that the pt not lift anything heavy  -The pt has no prohibitive toxicities from continuing Ninlaro at this time.   -Continue Aredia every 8 weeks   -tolerating maintenance  Ninlaro 53m89m1/8/15 q28days  -Continue B12 replacement -Continue acyclovir for shingles prophylaxis. -Continue aspirin -Will see the pt back in 8 weeks           2) CAD s/p prox LAD DES PCI on 07/31/2016 with ischemic cardiomyopathy ejection fraction of 25-35%. Rpt ECHO on 10/03/2016 shows improvement in EF to 55-60% with no RWMABN.  3) Macrocytic Anemia with moderate thrombocytopenia - due to MM and and Ninlaro. Stable.  4) Thrombocytopenia - due to MM and treatment --improved today 108k. --will monitor  5) B12 deficiency - Plan -cont SL B12 1000m78mo daily  6) h/o Afib with RVR on coumadin Some bradycardia with BB -continue mx per PCP and cardiology  7) Back pain -- multiple chronic compression fractures with some worsening -on bisphosphonates already -Ergocalciferol 50k units weekly -Discussed the option to get a referral to see an orthopedist regarding his continued back pain, suspecting spinal stenosis and degenerative disease, as opposed to just due to myeloma. -Will order CT Lumbrosacral, to rule out myeloma etiology of lower  back pain, before sending referral  -Advised that the pt not lift anything heavy   -Continue Ninlaro -continue Pamidronate q8 weeks  -RTC with Dr Irene Limbo in 8 weeks with labs  -CT Lumbar spine and pelvis in 7 weeks.    All of the patients questions were answered with apparent satisfaction. The patient knows to call the clinic with any problems, questions or concerns.  The total time spent in the appt was 25 minutes and more than 50% was on counseling and direct patient cares.    Sullivan Lone MD Ramos AAHIVMS [facility] Hematology/Oncology Physician [facility]  (Office):       [phone] (Work cell):  [phone] (Fax):           [phone]  I, Baldwin Jamaica, am acting as a scribe for Dr. Sullivan Lone.   .I have reviewed the above documentation for accuracy and completeness, and I agree with the above. Brunetta Genera MD

## 2018-09-27 ENCOUNTER — Inpatient Hospital Stay (HOSPITAL_BASED_OUTPATIENT_CLINIC_OR_DEPARTMENT_OTHER): Payer: Medicare HMO | Admitting: Hematology

## 2018-09-27 ENCOUNTER — Inpatient Hospital Stay: Payer: Medicare HMO | Attending: Hematology

## 2018-09-27 ENCOUNTER — Encounter: Payer: Self-pay | Admitting: Hematology

## 2018-09-27 ENCOUNTER — Inpatient Hospital Stay: Payer: Medicare HMO

## 2018-09-27 ENCOUNTER — Other Ambulatory Visit: Payer: Medicare HMO

## 2018-09-27 VITALS — BP 93/68 | HR 67 | Temp 97.7°F | Resp 18 | Ht 69.0 in | Wt 206.0 lb

## 2018-09-27 DIAGNOSIS — K219 Gastro-esophageal reflux disease without esophagitis: Secondary | ICD-10-CM | POA: Insufficient documentation

## 2018-09-27 DIAGNOSIS — E538 Deficiency of other specified B group vitamins: Secondary | ICD-10-CM

## 2018-09-27 DIAGNOSIS — D6959 Other secondary thrombocytopenia: Secondary | ICD-10-CM | POA: Insufficient documentation

## 2018-09-27 DIAGNOSIS — Z7189 Other specified counseling: Secondary | ICD-10-CM

## 2018-09-27 DIAGNOSIS — Z7901 Long term (current) use of anticoagulants: Secondary | ICD-10-CM | POA: Insufficient documentation

## 2018-09-27 DIAGNOSIS — G4730 Sleep apnea, unspecified: Secondary | ICD-10-CM

## 2018-09-27 DIAGNOSIS — I2510 Atherosclerotic heart disease of native coronary artery without angina pectoris: Secondary | ICD-10-CM

## 2018-09-27 DIAGNOSIS — I10 Essential (primary) hypertension: Secondary | ICD-10-CM | POA: Diagnosis not present

## 2018-09-27 DIAGNOSIS — M545 Low back pain, unspecified: Secondary | ICD-10-CM

## 2018-09-27 DIAGNOSIS — G629 Polyneuropathy, unspecified: Secondary | ICD-10-CM

## 2018-09-27 DIAGNOSIS — C9000 Multiple myeloma not having achieved remission: Secondary | ICD-10-CM

## 2018-09-27 DIAGNOSIS — E119 Type 2 diabetes mellitus without complications: Secondary | ICD-10-CM | POA: Insufficient documentation

## 2018-09-27 DIAGNOSIS — I4891 Unspecified atrial fibrillation: Secondary | ICD-10-CM | POA: Insufficient documentation

## 2018-09-27 DIAGNOSIS — Z79899 Other long term (current) drug therapy: Secondary | ICD-10-CM | POA: Insufficient documentation

## 2018-09-27 DIAGNOSIS — E785 Hyperlipidemia, unspecified: Secondary | ICD-10-CM | POA: Insufficient documentation

## 2018-09-27 DIAGNOSIS — D649 Anemia, unspecified: Secondary | ICD-10-CM | POA: Diagnosis not present

## 2018-09-27 DIAGNOSIS — D696 Thrombocytopenia, unspecified: Secondary | ICD-10-CM

## 2018-09-27 LAB — CBC WITH DIFFERENTIAL/PLATELET
ABS IMMATURE GRANULOCYTES: 0.02 10*3/uL (ref 0.00–0.07)
BASOS ABS: 0 10*3/uL (ref 0.0–0.1)
BASOS PCT: 1 %
EOS ABS: 0.2 10*3/uL (ref 0.0–0.5)
Eosinophils Relative: 3 %
HCT: 40.4 % (ref 39.0–52.0)
Hemoglobin: 12.7 g/dL — ABNORMAL LOW (ref 13.0–17.0)
IMMATURE GRANULOCYTES: 1 %
Lymphocytes Relative: 17 %
Lymphs Abs: 0.7 10*3/uL (ref 0.7–4.0)
MCH: 29.3 pg (ref 26.0–34.0)
MCHC: 31.4 g/dL (ref 30.0–36.0)
MCV: 93.3 fL (ref 80.0–100.0)
MONOS PCT: 9 %
Monocytes Absolute: 0.4 10*3/uL (ref 0.1–1.0)
NEUTROS ABS: 3.1 10*3/uL (ref 1.7–7.7)
NEUTROS PCT: 69 %
NRBC: 0 % (ref 0.0–0.2)
PLATELETS: 82 10*3/uL — AB (ref 150–400)
RBC: 4.33 MIL/uL (ref 4.22–5.81)
RDW: 15.8 % — AB (ref 11.5–15.5)
WBC: 4.4 10*3/uL (ref 4.0–10.5)

## 2018-09-27 LAB — CMP (CANCER CENTER ONLY)
ALBUMIN: 3.9 g/dL (ref 3.5–5.0)
ALT: 22 U/L (ref 0–44)
AST: 33 U/L (ref 15–41)
Alkaline Phosphatase: 114 U/L (ref 38–126)
Anion gap: 10 (ref 5–15)
BILIRUBIN TOTAL: 0.7 mg/dL (ref 0.3–1.2)
BUN: 30 mg/dL — ABNORMAL HIGH (ref 8–23)
CALCIUM: 9 mg/dL (ref 8.9–10.3)
CO2: 24 mmol/L (ref 22–32)
Chloride: 108 mmol/L (ref 98–111)
Creatinine: 1.54 mg/dL — ABNORMAL HIGH (ref 0.61–1.24)
GFR, EST AFRICAN AMERICAN: 52 mL/min — AB (ref >60)
GFR, EST NON AFRICAN AMERICAN: 45 mL/min — AB (ref >60)
GLUCOSE: 109 mg/dL — AB (ref 70–99)
POTASSIUM: 4.3 mmol/L (ref 3.5–5.1)
Sodium: 142 mmol/L (ref 135–145)
TOTAL PROTEIN: 6.9 g/dL (ref 6.5–8.1)

## 2018-09-27 MED ORDER — SODIUM CHLORIDE 0.9 % IV SOLN
60.0000 mg | Freq: Once | INTRAVENOUS | Status: DC
  Filled 2018-09-27: qty 10

## 2018-09-27 MED ORDER — SODIUM CHLORIDE 0.9 % IV SOLN
Freq: Once | INTRAVENOUS | Status: AC
  Administered 2018-09-27: 11:00:00 via INTRAVENOUS
  Filled 2018-09-27: qty 250

## 2018-09-27 MED ORDER — SODIUM CHLORIDE 0.9 % IV SOLN
60.0000 mg | Freq: Once | INTRAVENOUS | Status: AC
  Administered 2018-09-27: 60 mg via INTRAVENOUS
  Filled 2018-09-27: qty 20

## 2018-09-27 NOTE — Patient Instructions (Signed)
Thank you for choosing [facility] to provide your oncology and hematology care.  To afford each patient quality time with our providers, please arrive 30 minutes before your scheduled appointment time.  If you arrive late for your appointment, you may be asked to reschedule.  We strive to give you quality time with our providers, and arriving late affects you and other patients whose appointments are after yours.    If you are a no show for multiple scheduled visits, you may be dismissed from the clinic at the providers discretion.     Again, thank you for choosing [facility], our hope is that these requests will decrease the amount of time that you wait before being seen by our physicians.  ______________________________________________________________________   Should you have questions after your visit to the [facility], please contact our office at [phone] between the hours of 8:30 and 4:30 p.m.    Voicemails left after 4:30p.m will not be returned until the following business day.     For prescription refill requests, please have your pharmacy contact us directly.  Please also try to allow 48 hours for prescription requests.     Please contact the scheduling department for questions regarding scheduling.  For scheduling of procedures such as PET scans, CT scans, MRI, Ultrasound, etc please contact central scheduling at [phone].     Resources For Cancer Patients and Caregivers:    Oncolink.org:  A wonderful resource for patients and healthcare providers for information regarding your disease, ways to tract your treatment, what to expect, etc.      American Cancer Society:  800-227-2345  Can help patients locate various types of support and financial assistance   Cancer Care: 1-800-813-HOPE (4673) Provides financial assistance, online support groups, medication/co-pay assistance.     [place] DSS:  336-641-3447 Where to apply  for food stamps, Medicaid, and utility assistance   Medicare Rights Center: 800-333-4114 Helps people with Medicare understand their rights and benefits, navigate the Medicare system, and secure the quality healthcare they deserve   SCAT: 336-333-6589 [place] [employer]'s shared-ride transportation service for eligible riders who have a disability that prevents them from riding the fixed route bus.     For additional information on assistance programs please contact our social worker:   Abigail Elmore:  [phone]  

## 2018-09-28 LAB — KAPPA/LAMBDA LIGHT CHAINS
KAPPA, LAMDA LIGHT CHAIN RATIO: 5.61 — AB (ref 0.26–1.65)
Kappa free light chain: 58.3 mg/L — ABNORMAL HIGH (ref 3.3–19.4)
LAMDA FREE LIGHT CHAINS: 10.4 mg/L (ref 5.7–26.3)

## 2018-09-28 LAB — VITAMIN D 25 HYDROXY (VIT D DEFICIENCY, FRACTURES): VIT D 25 HYDROXY: 46.2 ng/mL (ref 30.0–100.0)

## 2018-09-29 ENCOUNTER — Telehealth: Payer: Self-pay

## 2018-09-29 NOTE — Telephone Encounter (Signed)
Added inj to inf appointment. Per 12/9 los mailed a letter with a calender enclosed of the pre scheduled appointment.

## 2018-09-30 MED FILL — NINLARO 3 MG CAPSULE: 3 | 28 days supply | Qty: 3 | Fill #1

## 2018-10-25 ENCOUNTER — Other Ambulatory Visit: Payer: Self-pay | Admitting: Hematology

## 2018-10-25 DIAGNOSIS — C9000 Multiple myeloma not having achieved remission: Secondary | ICD-10-CM

## 2018-10-28 MED FILL — NINLARO 3 MG CAPSULE: 3 | 28 days supply | Qty: 3 | Fill #0

## 2018-11-15 ENCOUNTER — Ambulatory Visit (HOSPITAL_COMMUNITY)
Admission: RE | Admit: 2018-11-15 | Discharge: 2018-11-15 | Disposition: A | Discharge status: Home / Self Care | Payer: Medicare HMO | Source: Ambulatory Visit | Attending: Hematology | Admitting: Hematology

## 2018-11-15 DIAGNOSIS — M545 Low back pain, unspecified: Secondary | ICD-10-CM

## 2018-11-15 DIAGNOSIS — C9000 Multiple myeloma not having achieved remission: Secondary | ICD-10-CM

## 2018-11-15 DIAGNOSIS — D696 Thrombocytopenia, unspecified: Secondary | ICD-10-CM | POA: Insufficient documentation

## 2018-11-17 ENCOUNTER — Other Ambulatory Visit: Payer: Self-pay | Admitting: Hematology

## 2018-11-17 DIAGNOSIS — C9000 Multiple myeloma not having achieved remission: Secondary | ICD-10-CM

## 2018-11-19 NOTE — Progress Notes (Signed)
Richard Vang    HEMATOLOGY/ONCOLOGY CLINIC NOTE  Date of Service: 11/22/18    Patient Care Team: Sandi Mariscal, MD as PCP - General (Internal Medicine)  CHIEF COMPLAINTS/PURPOSE OF CONSULTATION:   F/u for continued management of Myeloma   HISTORY OF PRESENTING ILLNESS:  plz see previous note for details on HPI  INTERVAL HISTORY   Richard Vang is here for his scheduled followup for multiple myeloma. The patient's last visit with Richard Vang was on 09/27/18. The pt reports that he is doing well overall.   The pt reports that he has continued to have lower back pain, leg weakness, and a stable sensation of tingling in his lower legs. He notes that he has not developed any new concerns. He has continued to enjoy stable energy levels and denies any falls.  Of note since the patient's last visit, pt has had CT Pelvis completed on 11/15/18 with results revealing 3 mm stone in the right UVJ. 5 x 6 mm stone is identified in the left UVJ. No associated distal hydroureter. 2. Stable lucent lesion in the right iliac bone. 3. Small right groin hernia contains only fat.  The pt also had a CT Lumbar on 11/15/18 which revealed No acute fracture or malalignment. 2. Severe osteopenia and multiple old compression fractures, severe at T11 and T12. 3. Severe canal stenosis L3-4. Multilevel neural foraminal narrowing: Moderate to severe at T12-L1. 4. Slowly enlarging solid LEFT renal mass. Recommend non emergent MRI renal mass protocol on non emergent basis  Lab results today (11/22/18) of CBC w/diff and CMP is as follows: all values are WNL except for RBC at 4.01, HGB at 12.1, HCT at 38.2, RDW at 15.6, PLT at 97k, BUN at 30, Creatinine at 1.47, GFR at 47. 11/22/18 SFLC improved K/L ratio 11/22/18 Vitamin D is 36.7  On review of systems, pt reports stable energy levels, stable bilateral leg weakness, tingling in both lower legs, lower back pain, eating well, and denies leg swelling, concerns for infections, and any other symptoms.   MEDICAL  HISTORY:  Past Medical History:  Diagnosis Date  . Arthritis    "left shoulder" (07/31/2016)  . Coronary artery disease   . GERD (gastroesophageal reflux disease)   . Heart murmur   . High cholesterol   . Hypertension   . Multiple myeloma ([place])   . Sleep apnea    "probably; having test in November" (07/31/2016)  . Type II diabetes mellitus ([place])     SURGICAL HISTORY: Past Surgical History:  Procedure Laterality Date  . CARDIAC CATHETERIZATION N/A 07/31/2016   Procedure: Left Heart Cath and Coronary Angiography;  Surgeon: Lorretta Harp, MD;  Location: [facility];  Service: Cardiovascular;  Laterality: N/A;  . CARDIAC CATHETERIZATION N/A 07/31/2016   Procedure: Coronary Stent Intervention;  Surgeon: Lorretta Harp, MD;  Location: [facility];  Service: Cardiovascular;  Laterality: N/A;  . CORONARY ANGIOPLASTY    . OPEN REDUCTION INTERNAL FIXATION (ORIF) METACARPAL Left 05/21/2018   Procedure: OPEN REDUCTION INTERNAL FIXATION (ORIF) 2ND METACARPAL;  Surgeon: Shona Needles, MD;  Location: [facility];  Service: Orthopedics;  Laterality: Left;  . ORIF CLAVICULAR FRACTURE Right 05/21/2018   Procedure: OPEN REDUCTION INTERNAL FIXATION (ORIF) CLAVICULAR FRACTURE;  Surgeon: Shona Needles, MD;  Location: [place];  Service: Orthopedics;  Laterality: Right;  . SHOULDER SURGERY Left 1973   "put pin in it where it had separated"   . TONSILLECTOMY  ~ 1956    SOCIAL HISTORY: Social History  Socioeconomic History  . Marital status: Divorced    Spouse name: Not on file  . Number of children: Not on file  . Years of education: Not on file  . Highest education level: Not on file  Occupational History  . Not on file  Social Needs  . Financial resource strain: Not on file  . Food insecurity:    Worry: Not on file    Inability: Not on file  . Transportation needs:    Medical: Not on file    Non-medical: Not on file  Tobacco Use  . Smoking status: Never Smoker  . Smokeless  tobacco: Never Used  Substance and Sexual Activity  . Alcohol use: Yes    Comment: 07/31/2016 "nothing since 2002"  . Drug use: No  . Sexual activity: Not Currently  Lifestyle  . Physical activity:    Days per week: Not on file    Minutes per session: Not on file  . Stress: Not on file  Relationships  . Social connections:    Talks on phone: Not on file    Gets together: Not on file    Attends religious service: Not on file    Active member of club or organization: Not on file    Attends meetings of clubs or organizations: Not on file    Relationship status: Not on file  . Intimate partner violence:    Fear of current or ex partner: Not on file    Emotionally abused: Not on file    Physically abused: Not on file    Forced sexual activity: Not on file  Other Topics Concern  . Not on file  Social History Narrative  . Not on file    FAMILY HISTORY: Family History  Problem Relation Age of Onset  . Hypertension Other     ALLERGIES:  has No Known Allergies.  MEDICATIONS:  Current Outpatient Medications  Medication Sig Dispense Refill  . acyclovir (ZOVIRAX) 400 MG tablet TAKE 1 TABLET BY MOUTH TWICE DAILY 60 tablet 0  . amLODipine (NORVASC) 5 MG tablet Take 5 mg by mouth daily.    Richard Vang atorvastatin (LIPITOR) 40 MG tablet Take 1 tablet (40 mg total) by mouth at bedtime. 30 tablet 12  . carvedilol (COREG) 12.5 MG tablet Take 12.5 mg by mouth 2 (two) times daily with a meal.    . clopidogrel (PLAVIX) 75 MG tablet Take 1 tablet (75 mg total) by mouth daily with breakfast. 30 tablet 12  . Cyanocobalamin (VITAMIN B-12) 2500 MCG SUBL Place 1 tablet under the tongue daily with breakfast.    . cyclobenzaprine (FLEXERIL) 10 MG tablet Take 10 mg by mouth daily as needed for pain.  2  . furosemide (LASIX) 40 MG tablet Take 40 mg by mouth daily with breakfast.     . insulin aspart (NOVOLOG) 100 UNIT/ML injection Inject 0-9 Units into the skin every 4 (four) hours. 10 mL 11  . NINLARO 3 MG  capsule TAKE 1 CAPSULE (3 MG TOTAL) BY MOUTH ONCE A WEEK. ON DAY 1,8,15 EVERY 28 DAYS. TAKE ON AN EMPTY STOMACH 1HR BEFORE OR 2HRS AFTER FOOD. 3 capsule 1  . nitroGLYCERIN (NITROSTAT) 0.3 MG SL tablet Place 0.3 mg under the tongue every 5 (five) minutes as needed for chest pain.     Richard Vang omeprazole (PRILOSEC) 40 MG capsule Take 40 mg by mouth daily with breakfast.     . ondansetron (ZOFRAN) 8 MG tablet TAKE 1 TABLET BY MOUTH TWICE DAILY AS NEEDED  FOR  REFRACTORY NAUSEA/VOMITING. STARTING ON DAYS  4 AND 11 NOT ON DAY 8 30 tablet 1  . oxyCODONE-acetaminophen (PERCOCET) 10-325 MG tablet Take 1 tablet by mouth 2 (two) times daily. 10 tablet 0  . potassium chloride SA (K-DUR,KLOR-CON) 20 MEQ tablet Take 1 tablet (20 mEq total) by mouth daily. (Patient taking differently: Take 20 mEq by mouth daily with breakfast. )    . prochlorperazine (COMPAZINE) 10 MG tablet Take 1 tablet (10 mg total) by mouth every 6 (six) hours as needed (Nausea or vomiting). (Patient taking differently: Take 10 mg by mouth every 6 (six) hours as needed for nausea or vomiting. ) 30 tablet 1  . tamsulosin (FLOMAX) 0.4 MG CAPS capsule Take 1 capsule (0.4 mg total) by mouth daily after supper. 30 capsule 0  . Vitamin D, Ergocalciferol, (DRISDOL) 50000 units CAPS capsule TAKE 1 CAPSULE BY MOUTH ONCE A WEEK 12 capsule 1  . warfarin (COUMADIN) 4 MG tablet Take 1.5 tablets (6 mg total) by mouth daily. 30 tablet 0   No current facility-administered medications for this visit.    Facility-Administered Medications Ordered in Other Visits  Medication Dose Route Frequency Provider Last Rate Last Dose  . 0.9 %  sodium chloride infusion   Intravenous Continuous Brunetta Genera, MD 10 mL/hr at 07/09/17 1423      REVIEW OF SYSTEMS:    A 10+ POINT REVIEW OF SYSTEMS WAS OBTAINED including neurology, dermatology, psychiatry, cardiac, respiratory, lymph, extremities, GI, GU, Musculoskeletal, constitutional, breasts, reproductive, HEENT.  All  pertinent positives are noted in the HPI.  All others are negative.   PHYSICAL EXAMINATION:  ECOG PERFORMANCE STATUS: 2 VS reviewed  GENERAL:alert, in no acute distress and comfortable SKIN: no acute rashes, no significant lesions EYES: conjunctiva are pink and non-injected, sclera anicteric OROPHARYNX: MMM, no exudates, no oropharyngeal erythema or ulceration NECK: supple, no JVD LYMPH:  no palpable lymphadenopathy in the cervical, axillary or inguinal regions LUNGS: clear to auscultation b/l with normal respiratory effort HEART: regular rate & rhythm ABDOMEN:  normoactive bowel sounds , non tender, not distended. No palpable hepatosplenomegaly.  Extremity: no pedal edema PSYCH: alert & oriented x 3 with fluent speech NEURO: no focal motor/sensory deficits   LABORATORY DATA:  I have reviewed the data as listed . CBC Latest Ref Rng & Units 11/22/2018 09/27/2018 08/02/2018  WBC 4.0 - 10.5 K/uL 5.4 4.4 5.2  Hemoglobin 13.0 - 17.0 g/dL 12.1(L) 12.7(L) 11.9(L)  Hematocrit 39.0 - 52.0 % 38.2(L) 40.4 37.6(L)  Platelets 150 - 400 K/uL 97(L) 82(L) 108(L)   . CBC    Component Value Date/Time   WBC 5.4 11/22/2018 0815   RBC 4.01 (L) 11/22/2018 0815   HGB 12.1 (L) 11/22/2018 0815   HGB 10.5 (L) 02/25/2018 1219   HGB 9.2 (L) 10/01/2017 1112   HCT 38.2 (L) 11/22/2018 0815   HCT 29.3 (L) 10/01/2017 1112   PLT 97 (L) 11/22/2018 0815   PLT 132 (L) 02/25/2018 1219   PLT 170 10/01/2017 1112   MCV 95.3 11/22/2018 0815   MCV 88.5 10/01/2017 1112   MCH 30.2 11/22/2018 0815   MCHC 31.7 11/22/2018 0815   RDW 15.6 (H) 11/22/2018 0815   RDW 15.3 (H) 10/01/2017 1112   LYMPHSABS 0.7 11/22/2018 0815   LYMPHSABS 0.5 (L) 10/01/2017 1112   MONOABS 0.6 11/22/2018 0815   MONOABS 0.5 10/01/2017 1112   EOSABS 0.1 11/22/2018 0815   EOSABS 0.1 10/01/2017 1112   BASOSABS 0.0 11/22/2018 0815   BASOSABS  0.0 10/01/2017 1112    . CMP Latest Ref Rng & Units 11/22/2018 09/27/2018 08/02/2018  Glucose 70 -  99 mg/dL 97 109(H) 122(H)  BUN 8 - 23 mg/dL 30(H) 30(H) 39(H)  Creatinine 0.61 - 1.24 mg/dL 1.47(H) 1.54(H) 1.57(H)  Sodium 135 - 145 mmol/L 145 142 143  Potassium 3.5 - 5.1 mmol/L 4.0 4.3 4.2  Chloride 98 - 111 mmol/L 110 108 111  CO2 22 - 32 mmol/L _0 Calcium 8.9 - 10.3 mg/dL 9.1 9.0 9.2  Total Protein 6.5 - 8.1 g/dL 6.8 6.9 6.7  Total Bilirubin 0.3 - 1.2 mg/dL 1.0 0.7 0.6  Alkaline Phos 38 - 126 U/L 83 114 106  AST 15 - 41 U/L 23 33 29  ALT 0 - 44 U/L _1 RADIOGRAPHIC STUDIES: I have personally reviewed the radiological images as listed and agreed with the findings in the report. Ct Lumbar Spine Wo Contrast  Result Date: 11/15/2018 CLINICAL DATA:  Back and leg pain. Prickly sensation in lower extremities. EXAM: CT LUMBAR SPINE WITHOUT CONTRAST TECHNIQUE: Multidetector CT imaging of the lumbar spine was performed without intravenous contrast administration. Multiplanar CT image reconstructions were also generated. COMPARISON:  CT abdomen and pelvis May 19, 2018 and CT abdomen and pelvis November 03 2010. FINDINGS: SEGMENTATION: For the purposes of this report the last well-formed intervertebral disc space is reported as L5-S1; partially sacralized L5 vertebra. ALIGNMENT: Maintained lumbar lordosis. No malalignment. VERTEBRAE: No acute fracture. Stable severe T11, severe T12, moderate L1, mild-to-moderate L2, mild to moderate L3, severe L4 and mild L5 compression fractures. Old RIGHT L1 through L3 transverse process fractures. Severe osteopenia. Moderate similar L4-5 disc height loss with multilevel vacuum disc. PARASPINAL AND OTHER SOFT TISSUES: Nonacute. Severe aortoiliac calcifications. Bilateral nephrolithiasis, difficult to quantify due to motion. Stable solid LEFT lower pole 2 cm renal mass from 2019, however increased from 2012. DISC LEVELS: T12-L1: Retropulsed bony fragments resulting in mild canal stenosis. Moderate to severe neural foraminal narrowing. L1-2:  Small broad-based disc bulge, no canal stenosis. Mild neural foraminal narrowing. L2-3: Small broad-based disc bulge asymmetric to the RIGHT. No canal stenosis or neural foraminal narrowing. L3-4: Retropulsed bony fragments, disc bulge. Severe canal stenosis, AP dimension of the canal is 5 mm. Mild-to-moderate RIGHT, moderate LEFT neural foraminal narrowing. L4-5: Small broad-based disc osteophyte complex, mild facet arthropathy and ligamentum flavum redundancy without canal stenosis. Small LEFT extraforaminal disc protrusion. Moderate RIGHT and mild LEFT neural foraminal narrowing. L5-S1: Small broad-based disc osteophyte complex asymmetric to the RIGHT. No canal stenosis. Moderate RIGHT and mild LEFT neural foraminal narrowing. IMPRESSION: 1. No acute fracture or malalignment. 2. Severe osteopenia and multiple old compression fractures, severe at T11 and T12. 3. Severe canal stenosis L3-4. Multilevel neural foraminal narrowing: Moderate to severe at T12-L1. 4. Slowly enlarging solid LEFT renal mass. Recommend non emergent MRI renal mass protocol on non emergent basis. Aortic Atherosclerosis (ICD10-I70.0). Electronically Signed   By: Elon Alas M.D.   On: 11/15/2018 15:28   Ct Pelvis Wo Contrast  Result Date: 11/15/2018 CLINICAL DATA:  Multiple myeloma with leg pain. EXAM: CT PELVIS WITHOUT CONTRAST TECHNIQUE: Multidetector CT imaging of the pelvis was performed following the standard protocol without intravenous contrast. COMPARISON:  CT scan 05/19/2018 FINDINGS: Urinary Tract: Bladder is nondistended. There is a 3 mm stone in the right UVJ. A 5 x 6 mm stone is identified in the left UVJ. No distal hydroureter on either side.  Bowel: No small bowel or colonic dilatation within the visualized pelvis. Vascular/Lymphatic: Atherosclerotic calcification noted in the distal abdominal aorta and iliac arteries bilaterally. No pelvic sidewall lymphadenopathy. Reproductive:  Prostate gland is enlarged. Other:  No  intraperitoneal free fluid. Musculoskeletal: Right groin hernia contains only fat. 2.4 x 3.8 cm lucent lesion in the right iliac bone is stable since prior exam. SI joints are unremarkable. No evidence for femoral neck fracture. Sagittal view shows marked compression deformity at L4, stable. IMPRESSION: 1. 3 mm stone in the right UVJ. 5 x 6 mm stone is identified in the left UVJ. No associated distal hydroureter. 2. Stable lucent lesion in the right iliac bone. 3. Small right groin hernia contains only fat. Electronically Signed   By: Misty Stanley M.D.   On: 11/15/2018 15:13    ASSESSMENT & PLAN:   73 y.o. 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  K/L 95, No M spike on SPEP -- suggests light chain MM Cytogenetics - normal male chromosomes FISH- +11 and 13q-/-13  Patient is s/p 1 cycle of Vd and Serum free kappa LC has decreased from 700 to 203.6 with improvement in his K/L ratio from 94.59 to 29. Completed 2nd cycle of treatment with Vd + Cytoxan (261m/m2) Completed 3rd cycle of treatment with Vd + Cytoxan (2072mm2) - cytoxan held D15 due to thrombocytopenia Completed 4th of VCd Discontinued VCd after C5D8 due to intolerance with diarrhea and increasing neuropathy and fatigue with borderline functional status. -Patient did not tolerate maintenance Revlimid 10 mg by mouth daily due to grade 2-3 diarrhea. This was discontinued and his diarrhea has resolved.  Currently on  maintenance Ninlaro M spike absent   PLAN:  -Discussed pt labwork today, 11/22/18; blood counts and chemistries are stable. Vitamin D is pending. 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  -11/22/18 SFLC is improving. Last available SFLC from 09/27/18 revealed Kappa decreased to 58.3 and K:L ratio decreased to 5.61 -Discussed the 11/15/18 CT Pelvic which revealed  3 mm stone in the right UVJ. 5 x 6 mm stone is identified in the left UVJ. No associated distal hydroureter. 2. Stable lucent lesion in the right iliac bone. 3. Small right groin hernia contains only fat. -Discussed the 11/15/18 CT Lumbar which revealed  No acute fracture or malalignment. 2. Severe osteopenia and multiple old compression fractures, severe at T11 and T12. 3. Severe canal stenosis L3-4. Multilevel neural foraminal narrowing: Moderate to severe at T12-L1. 4. Slowly enlarging solid LEFT renal mass. Recommend non emergent MRI renal mass protocol on non emergent basis -No obvious new myeloma involvement seen in most recent CT imaging. -Continue Vitamin D replacement and Aredia every 8 weeks -Discussed the option for an orthopedic referral, which the pt prefers -Discussed the left renal mass: Possibly Complex cyst? Recommend PCP Dr. YuSandi Mariscalonsider urology referral. Pt notes he sees his PCP once a month. -The pt has no prohibitive toxicities from continuing 13m64minlaro on D1, D8 and D15 at this time.   -Advised that the pt not lift anything heavy  -  Continue B12 replacement -Continue acyclovir for shingles prophylaxis. -Continue aspirin -Will see the pt back in 2 months           2) CAD s/p prox LAD DES PCI on 07/31/2016 with ischemic cardiomyopathy ejection fraction of 25-35%. Rpt ECHO on 10/03/2016 shows improvement in EF to 55-60% with no RWMABN.  3) Macrocytic Anemia with moderate thrombocytopenia - due to MM and and Ninlaro. Stable.  4) Thrombocytopenia - due to MM and treatment --improved today  108k. --will monitor  5) B12 deficiency - Plan -cont SL B12 1014mg po daily  6) h/o Afib with RVR on coumadin Some bradycardia with BB -continue mx per PCP and cardiology  7) Back pain -- multiple chronic compression fractures with some worsening -on bisphosphonates already -Ergocalciferol 50k units weekly -Advised that the pt not lift anything heavy  -Discussed the 11/15/18 CT imaging, as noted above, will refer pt to orthopedist   -Orthopedics referral for back pain/spinal stenosis and multilevel compression fractures -RTC with Dr KIrene Limboin 2 months with labs and next dose of Pamidronate as ordered   All of the patients questions were answered with apparent satisfaction. The patient knows to call the clinic with any problems, questions or concerns.  The total time spent in the appt was 25 minutes and more than 50% was on counseling and direct patient cares.   GSullivan LoneMD MAuberryAAHIVMS SRegency Hospital Of Mpls LLCCHolton Community HospitalHematology/Oncology Physician [facility] (Office):       [phone](Work cell):  [phone](Fax):           [phone] I, SBaldwin [place] am acting as a scribe for Dr. GSullivan Lone   .I have reviewed the above documentation for accuracy and completeness, and I agree with the above. .Brunetta GeneraMD

## 2018-11-22 ENCOUNTER — Inpatient Hospital Stay: Payer: Medicare HMO | Attending: Hematology

## 2018-11-22 ENCOUNTER — Inpatient Hospital Stay (HOSPITAL_BASED_OUTPATIENT_CLINIC_OR_DEPARTMENT_OTHER): Payer: Medicare HMO | Admitting: Hematology

## 2018-11-22 ENCOUNTER — Inpatient Hospital Stay: Payer: Medicare HMO

## 2018-11-22 ENCOUNTER — Other Ambulatory Visit: Payer: Medicare HMO

## 2018-11-22 ENCOUNTER — Telehealth: Payer: Self-pay

## 2018-11-22 VITALS — BP 136/83

## 2018-11-22 VITALS — BP 122/101 | HR 66 | Temp 98.0°F | Resp 18 | Ht 69.0 in | Wt 214.9 lb

## 2018-11-22 DIAGNOSIS — E7800 Pure hypercholesterolemia, unspecified: Secondary | ICD-10-CM

## 2018-11-22 DIAGNOSIS — C9000 Multiple myeloma not having achieved remission: Secondary | ICD-10-CM

## 2018-11-22 DIAGNOSIS — R202 Paresthesia of skin: Secondary | ICD-10-CM | POA: Insufficient documentation

## 2018-11-22 DIAGNOSIS — M1990 Unspecified osteoarthritis, unspecified site: Secondary | ICD-10-CM | POA: Diagnosis not present

## 2018-11-22 DIAGNOSIS — E538 Deficiency of other specified B group vitamins: Secondary | ICD-10-CM

## 2018-11-22 DIAGNOSIS — M8580 Other specified disorders of bone density and structure, unspecified site: Secondary | ICD-10-CM | POA: Insufficient documentation

## 2018-11-22 DIAGNOSIS — D6959 Other secondary thrombocytopenia: Secondary | ICD-10-CM | POA: Insufficient documentation

## 2018-11-22 DIAGNOSIS — I10 Essential (primary) hypertension: Secondary | ICD-10-CM | POA: Insufficient documentation

## 2018-11-22 DIAGNOSIS — Z794 Long term (current) use of insulin: Secondary | ICD-10-CM

## 2018-11-22 DIAGNOSIS — K219 Gastro-esophageal reflux disease without esophagitis: Secondary | ICD-10-CM | POA: Diagnosis not present

## 2018-11-22 DIAGNOSIS — M4854XS Collapsed vertebra, not elsewhere classified, thoracic region, sequela of fracture: Secondary | ICD-10-CM | POA: Insufficient documentation

## 2018-11-22 DIAGNOSIS — M545 Low back pain, unspecified: Secondary | ICD-10-CM

## 2018-11-22 DIAGNOSIS — M6281 Muscle weakness (generalized): Secondary | ICD-10-CM

## 2018-11-22 DIAGNOSIS — E119 Type 2 diabetes mellitus without complications: Secondary | ICD-10-CM | POA: Insufficient documentation

## 2018-11-22 DIAGNOSIS — Z87311 Personal history of (healed) other pathological fracture: Secondary | ICD-10-CM

## 2018-11-22 DIAGNOSIS — I2510 Atherosclerotic heart disease of native coronary artery without angina pectoris: Secondary | ICD-10-CM

## 2018-11-22 DIAGNOSIS — D649 Anemia, unspecified: Secondary | ICD-10-CM | POA: Insufficient documentation

## 2018-11-22 DIAGNOSIS — Z79899 Other long term (current) drug therapy: Secondary | ICD-10-CM | POA: Diagnosis not present

## 2018-11-22 DIAGNOSIS — D696 Thrombocytopenia, unspecified: Secondary | ICD-10-CM

## 2018-11-22 DIAGNOSIS — Z7189 Other specified counseling: Secondary | ICD-10-CM

## 2018-11-22 LAB — CMP (CANCER CENTER ONLY)
ALT: 17 U/L (ref 0–44)
AST: 23 U/L (ref 15–41)
Albumin: 4 g/dL (ref 3.5–5.0)
Alkaline Phosphatase: 83 U/L (ref 38–126)
Anion gap: 10 (ref 5–15)
BUN: 30 mg/dL — ABNORMAL HIGH (ref 8–23)
CHLORIDE: 110 mmol/L (ref 98–111)
CO2: 25 mmol/L (ref 22–32)
Calcium: 9.1 mg/dL (ref 8.9–10.3)
Creatinine: 1.47 mg/dL — ABNORMAL HIGH (ref 0.61–1.24)
GFR, Est AFR Am: 55 mL/min — ABNORMAL LOW (ref >60)
GFR, Estimated: 47 mL/min — ABNORMAL LOW (ref >60)
Glucose, Bld: 97 mg/dL (ref 70–99)
Potassium: 4 mmol/L (ref 3.5–5.1)
Sodium: 145 mmol/L (ref 135–145)
Total Bilirubin: 1 mg/dL (ref 0.3–1.2)
Total Protein: 6.8 g/dL (ref 6.5–8.1)

## 2018-11-22 LAB — CBC WITH DIFFERENTIAL/PLATELET
Abs Immature Granulocytes: 0.03 10*3/uL (ref 0.00–0.07)
Basophils Absolute: 0 10*3/uL (ref 0.0–0.1)
Basophils Relative: 0 %
Eosinophils Absolute: 0.1 10*3/uL (ref 0.0–0.5)
Eosinophils Relative: 2 %
HCT: 38.2 % — ABNORMAL LOW (ref 39.0–52.0)
Hemoglobin: 12.1 g/dL — ABNORMAL LOW (ref 13.0–17.0)
Immature Granulocytes: 1 %
LYMPHS PCT: 13 %
Lymphs Abs: 0.7 10*3/uL (ref 0.7–4.0)
MCH: 30.2 pg (ref 26.0–34.0)
MCHC: 31.7 g/dL (ref 30.0–36.0)
MCV: 95.3 fL (ref 80.0–100.0)
Monocytes Absolute: 0.6 10*3/uL (ref 0.1–1.0)
Monocytes Relative: 10 %
Neutro Abs: 4 10*3/uL (ref 1.7–7.7)
Neutrophils Relative %: 74 %
Platelets: 97 10*3/uL — ABNORMAL LOW (ref 150–400)
RBC: 4.01 MIL/uL — ABNORMAL LOW (ref 4.22–5.81)
RDW: 15.6 % — ABNORMAL HIGH (ref 11.5–15.5)
WBC: 5.4 10*3/uL (ref 4.0–10.5)
nRBC: 0 % (ref 0.0–0.2)

## 2018-11-22 MED ORDER — SODIUM CHLORIDE 0.9 % IV SOLN
INTRAVENOUS | Status: DC
  Administered 2018-11-22: 10:00:00 via INTRAVENOUS
  Filled 2018-11-22 (×2): qty 250

## 2018-11-22 MED ORDER — SODIUM CHLORIDE 0.9 % IV SOLN
60.0000 mg | Freq: Once | INTRAVENOUS | Status: AC
  Administered 2018-11-22: 60 mg via INTRAVENOUS
  Filled 2018-11-22: qty 20

## 2018-11-22 NOTE — Patient Instructions (Signed)
Pamidronate injection What is this medicine? PAMIDRONATE (pa mi DROE nate) slows calcium loss from bones. It is used to treat high calcium blood levels from cancer or Paget's disease. It is also used to treat bone pain and prevent fractures from certain cancers that have spread to the bone. This medicine may be used for other purposes; ask your health care provider or pharmacist if you have questions. COMMON BRAND NAME(S): Aredia What should I tell my health care provider before I take this medicine? They need to know if you have any of these conditions: -aspirin-sensitive asthma -dental disease -kidney disease -an unusual or allergic reaction to pamidronate, other medicines, foods, dyes, or preservatives -pregnant or trying to get pregnant -breast-feeding How should I use this medicine? This medicine is for infusion into a vein. It is given by a health care professional in a hospital or clinic setting. Talk to your pediatrician regarding the use of this medicine in children. This medicine is not approved for use in children. Overdosage: If you think you have taken too much of this medicine contact a poison control center or emergency room at once. NOTE: This medicine is only for you. Do not share this medicine with others. What if I miss a dose? This does not apply. What may interact with this medicine? -certain antibiotics given by injection -medicines for inflammation or pain like ibuprofen, naproxen -some diuretics like bumetanide, furosemide -cyclosporine -parathyroid hormone -tacrolimus -teriparatide -thalidomide This list may not describe all possible interactions. Give your health care provider a list of all the medicines, herbs, non-prescription drugs, or dietary supplements you use. Also tell them if you smoke, drink alcohol, or use illegal drugs. Some items may interact with your medicine. What should I watch for while using this medicine? Visit your doctor or health care  professional for regular checkups. It may be some time before you see the benefit from this medicine. Do not stop taking your medicine unless your doctor tells you to. Your doctor may order blood tests or other tests to see how you are doing. Women should inform their doctor if they wish to become pregnant or think they might be pregnant. There is a potential for serious side effects to an unborn child. Talk to your health care professional or pharmacist for more information. You should make sure that you get enough calcium and vitamin D while you are taking this medicine. Discuss the foods you eat and the vitamins you take with your health care professional. Some people who take this medicine have severe bone, joint, and/or muscle pain. This medicine may also increase your risk for a broken thigh bone. Tell your doctor right away if you have pain in your upper leg or groin. Tell your doctor if you have any pain that does not go away or that gets worse. What side effects may I notice from receiving this medicine? Side effects that you should report to your doctor or health care professional as soon as possible: -allergic reactions like skin rash, itching or hives, swelling of the face, lips, or tongue -black or tarry stools -changes in vision -eye inflammation, pain -high blood pressure -jaw pain, especially burning or cramping -muscle weakness -numb, tingling pain -swelling of feet or hands -trouble passing urine or change in the amount of urine -unable to move easily Side effects that usually do not require medical attention (report to your doctor or health care professional if they continue or are bothersome): -bone, joint, or muscle pain -constipation -dizzy, drowsy -  trouble passing urine or change in the amount of urine  -unable to move easily  Side effects that usually do not require medical attention (report to your doctor or health care professional if they continue or are bothersome):  -bone, joint, or muscle pain  -constipation  -dizzy, drowsy  -fever  -headache  -loss of appetite  -nausea, vomiting  -pain at site where injected  This list may not describe all possible side effects. Call your doctor for medical advice about side effects. You may report side effects to  FDA at 1-800-FDA-1088.  Where should I keep my medicine?  This drug is given in a hospital or clinic and will not be stored at home.  NOTE: This sheet is a summary. It may not cover all possible information. If you have questions about this medicine, talk to your doctor, pharmacist, or health care provider.   2019 Elsevier/Gold Standard (2011-04-04 08:49:49)

## 2018-11-22 NOTE — Patient Instructions (Signed)
Thank you for choosing [facility] to provide your oncology and hematology care.  To afford each patient quality time with our providers, please arrive 30 minutes before your scheduled appointment time.  If you arrive late for your appointment, you may be asked to reschedule.  We strive to give you quality time with our providers, and arriving late affects you and other patients whose appointments are after yours.    If you are a no show for multiple scheduled visits, you may be dismissed from the clinic at the providers discretion.     Again, thank you for choosing [facility], our hope is that these requests will decrease the amount of time that you wait before being seen by our physicians.  ______________________________________________________________________   Should you have questions after your visit to the [facility], please contact our office at [phone] between the hours of 8:30 and 4:30 p.m.    Voicemails left after 4:30p.m will not be returned until the following business day.     For prescription refill requests, please have your pharmacy contact us directly.  Please also try to allow 48 hours for prescription requests.     Please contact the scheduling department for questions regarding scheduling.  For scheduling of procedures such as PET scans, CT scans, MRI, Ultrasound, etc please contact central scheduling at [phone].     Resources For Cancer Patients and Caregivers:    Oncolink.org:  A wonderful resource for patients and healthcare providers for information regarding your disease, ways to tract your treatment, what to expect, etc.      American Cancer Society:  800-227-2345  Can help patients locate various types of support and financial assistance   Cancer Care: 1-800-813-HOPE (4673) Provides financial assistance, online support groups, medication/co-pay assistance.     [place] DSS:  336-641-3447 Where to apply  for food stamps, Medicaid, and utility assistance   Medicare Rights Center: 800-333-4114 Helps people with Medicare understand their rights and benefits, navigate the Medicare system, and secure the quality healthcare they deserve   SCAT: 336-333-6589 [place] [employer]'s shared-ride transportation service for eligible riders who have a disability that prevents them from riding the fixed route bus.     For additional information on assistance programs please contact our social worker:   Richard Vang:  [phone]  

## 2018-11-22 NOTE — Telephone Encounter (Signed)
Error opening  

## 2018-11-23 LAB — KAPPA/LAMBDA LIGHT CHAINS
Kappa free light chain: 54.1 mg/L — ABNORMAL HIGH (ref 3.3–19.4)
Kappa, lambda light chain ratio: 4.96 — ABNORMAL HIGH (ref 0.26–1.65)
LAMDA FREE LIGHT CHAINS: 10.9 mg/L (ref 5.7–26.3)

## 2018-11-23 LAB — VITAMIN D 25 HYDROXY (VIT D DEFICIENCY, FRACTURES): VIT D 25 HYDROXY: 36.7 ng/mL (ref 30.0–100.0)

## 2018-11-24 LAB — MULTIPLE MYELOMA PANEL, SERUM
ALPHA2 GLOB SERPL ELPH-MCNC: 0.8 g/dL (ref 0.4–1.0)
Albumin SerPl Elph-Mcnc: 3.6 g/dL (ref 2.9–4.4)
Albumin/Glob SerPl: 1.3 (ref 0.7–1.7)
Alpha 1: 0.2 g/dL (ref 0.0–0.4)
B-Globulin SerPl Elph-Mcnc: 0.9 g/dL (ref 0.7–1.3)
Gamma Glob SerPl Elph-Mcnc: 0.9 g/dL (ref 0.4–1.8)
Globulin, Total: 2.8 g/dL (ref 2.2–3.9)
IGG (IMMUNOGLOBIN G), SERUM: 983 mg/dL (ref 700–1600)
IgA: 43 mg/dL — ABNORMAL LOW (ref 61–437)
IgM (Immunoglobulin M), Srm: 27 mg/dL (ref 15–143)
Total Protein ELP: 6.4 g/dL (ref 6.0–8.5)

## 2018-11-25 MED FILL — NINLARO 3 MG CAPSULE: 3 | 28 days supply | Qty: 3 | Fill #1

## 2018-12-15 ENCOUNTER — Encounter (INDEPENDENT_AMBULATORY_CARE_PROVIDER_SITE_OTHER): Payer: Self-pay | Admitting: Orthopaedic Surgery

## 2018-12-15 ENCOUNTER — Ambulatory Visit (INDEPENDENT_AMBULATORY_CARE_PROVIDER_SITE_OTHER): Payer: Medicare HMO | Admitting: Orthopaedic Surgery

## 2018-12-15 VITALS — BP 159/110 | Temp 97.3°F | Ht 69.0 in | Wt 214.9 lb

## 2018-12-15 DIAGNOSIS — M48062 Spinal stenosis, lumbar region with neurogenic claudication: Secondary | ICD-10-CM | POA: Diagnosis not present

## 2018-12-15 DIAGNOSIS — C9000 Multiple myeloma not having achieved remission: Secondary | ICD-10-CM | POA: Diagnosis not present

## 2018-12-15 DIAGNOSIS — T07XXXA Unspecified multiple injuries, initial encounter: Secondary | ICD-10-CM
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
## 2018-12-17 ENCOUNTER — Other Ambulatory Visit: Payer: Self-pay | Admitting: Hematology

## 2018-12-17 DIAGNOSIS — C9000 Multiple myeloma not having achieved remission: Secondary | ICD-10-CM

## 2018-12-23 ENCOUNTER — Other Ambulatory Visit: Payer: Self-pay | Admitting: Hematology

## 2018-12-23 DIAGNOSIS — C9000 Multiple myeloma not having achieved remission: Secondary | ICD-10-CM

## 2018-12-23 MED FILL — NINLARO 3 MG CAPSULE: 3 | 28 days supply | Qty: 3 | Fill #0

## 2019-01-14 ENCOUNTER — Telehealth: Payer: Self-pay | Admitting: *Deleted

## 2019-01-14 NOTE — Telephone Encounter (Signed)
Patient's spouse contacted office to verify appt for lab/MD/infusion on Monday. Times confirmed. Advised her that visitors are restricted and that patient will be screened for physical symptoms at door. She verbalized understanding.

## 2019-01-14 NOTE — Progress Notes (Signed)
[name]    HEMATOLOGY/ONCOLOGY CLINIC NOTE  Date of Service: 01/17/19    Patient Care Team: Sandi Mariscal, MD as PCP - General (Internal Medicine)  CHIEF COMPLAINTS/PURPOSE OF CONSULTATION:   F/u for continued management of Myeloma   HISTORY OF PRESENTING ILLNESS:  plz see previous note for details on HPI  INTERVAL HISTORY   Mr. Gilkey is here for his scheduled followup for multiple myeloma. The patient's last visit with [name] was on 11/22/18. The pt reports that he is doing well overall.   The pt reports that his feet have continued to feel numb, his lower legs hurt, lower back continues to hurt, and is now following with orthopedics regarding his back pains and possible radiculopathy. He denies any worsened, or new, symptoms.   The pt notes that otherwise he "feels great," and has avoided crowds, going into public, and individuals with infections.  The pt notes that his right eye has been watering recently, which he believes could be from his seasonal environmental allergies. He notes that he is following up with his ophthalmologist regarding this.   The pt denies any problems taking Ninlaro at this time.   Lab results today (01/17/19) of CBC w/diff and CMP is as follows: all values are WNL except for HGB at 12.8, PLT at 89k, BUN at 35, Creatinine at 1.61, GFR at 42. 01/17/19 MMP and SFLC are pending  On review of systems, pt reports good energy levels, eating well, stable numbness in feet, stable back pain, and denies concerns for infections, new bone pains, and any other symptoms.  MEDICAL HISTORY:  Past Medical History:  Diagnosis Date  . Arthritis    "left shoulder" (07/31/2016)  . Coronary artery disease   . GERD (gastroesophageal reflux disease)   . Heart murmur   . High cholesterol   . Hypertension   . Multiple myeloma ([place])   . Sleep apnea    "probably; having test in November" (07/31/2016)  . Type II diabetes mellitus ([place])     SURGICAL HISTORY: Past Surgical History:   Procedure Laterality Date  . CARDIAC CATHETERIZATION N/A 07/31/2016   Procedure: Left Heart Cath and Coronary Angiography;  Surgeon: Lorretta Harp, MD;  Location: [facility];  Service: Cardiovascular;  Laterality: N/A;  . CARDIAC CATHETERIZATION N/A 07/31/2016   Procedure: Coronary Stent Intervention;  Surgeon: Lorretta Harp, MD;  Location: [facility];  Service: Cardiovascular;  Laterality: N/A;  . CORONARY ANGIOPLASTY    . OPEN REDUCTION INTERNAL FIXATION (ORIF) METACARPAL Left 05/21/2018   Procedure: OPEN REDUCTION INTERNAL FIXATION (ORIF) 2ND METACARPAL;  Surgeon: Shona Needles, MD;  Location: [facility];  Service: Orthopedics;  Laterality: Left;  . ORIF CLAVICULAR FRACTURE Right 05/21/2018   Procedure: OPEN REDUCTION INTERNAL FIXATION (ORIF) CLAVICULAR FRACTURE;  Surgeon: Shona Needles, MD;  Location: [facility];  Service: Orthopedics;  Laterality: Right;  . SHOULDER SURGERY Left 1973   "put pin in it where it had separated"   . TONSILLECTOMY  ~ 1956    SOCIAL HISTORY: Social History   Socioeconomic History  . Marital status: Divorced    Spouse name: Not on file  . Number of children: Not on file  . Years of education: Not on file  . Highest education level: Not on file  Occupational History  . Not on file  Social Needs  . Financial resource strain: Not on file  . Food insecurity:    Worry: Not on file    Inability: Not  on file  . Transportation needs:    Medical: Not on file    Non-medical: Not on file  Tobacco Use  . Smoking status: Never Smoker  . Smokeless tobacco: Never Used  Substance and Sexual Activity  . Alcohol use: Yes    Comment: 07/31/2016 "nothing since 2002"  . Drug use: No  . Sexual activity: Not Currently  Lifestyle  . Physical activity:    Days per week: Not on file    Minutes per session: Not on file  . Stress: Not on file  Relationships  . Social connections:    Talks on phone: Not on file    Gets together: Not on file    Attends  religious service: Not on file    Active member of club or organization: Not on file    Attends meetings of clubs or organizations: Not on file    Relationship status: Not on file  . Intimate partner violence:    Fear of current or ex partner: Not on file    Emotionally abused: Not on file    Physically abused: Not on file    Forced sexual activity: Not on file  Other Topics Concern  . Not on file  Social History Narrative  . Not on file    FAMILY HISTORY: Family History  Problem Relation Age of Onset  . Hypertension Other     ALLERGIES:  has No Known Allergies.  MEDICATIONS:  Current Outpatient Medications  Medication Sig Dispense Refill  . acyclovir (ZOVIRAX) 400 MG tablet Take 1 tablet by mouth twice daily 60 tablet 0  . amLODipine (NORVASC) 5 MG tablet Take 5 mg by mouth daily.    [name] atorvastatin (LIPITOR) 40 MG tablet Take 1 tablet (40 mg total) by mouth at bedtime. 30 tablet 12  . carvedilol (COREG) 12.5 MG tablet Take 12.5 mg by mouth 2 (two) times daily with a meal.    . clopidogrel (PLAVIX) 75 MG tablet Take 1 tablet (75 mg total) by mouth daily with breakfast. 30 tablet 12  . Cyanocobalamin (VITAMIN B-12) 2500 MCG SUBL Place 1 tablet under the tongue daily with breakfast.    . cyclobenzaprine (FLEXERIL) 10 MG tablet Take 10 mg by mouth daily as needed for pain.  2  . furosemide (LASIX) 40 MG tablet Take 40 mg by mouth daily with breakfast.     . insulin aspart (NOVOLOG) 100 UNIT/ML injection Inject 0-9 Units into the skin every 4 (four) hours. 10 mL 11  . NINLARO 3 MG capsule TAKE 1 CAPSULE (3 MG TOTAL) BY MOUTH ONCE A WEEK. ON DAY 1,8,15 EVERY 28 DAYS. TAKE ON AN EMPTY STOMACH 1HR BEFORE OR 2HRS AFTER FOOD. 3 capsule 1  . nitroGLYCERIN (NITROSTAT) 0.3 MG SL tablet Place 0.3 mg under the tongue every 5 (five) minutes as needed for chest pain.     [name] omeprazole (PRILOSEC) 40 MG capsule Take 40 mg by mouth daily with breakfast.     . ondansetron (ZOFRAN) 8 MG tablet TAKE 1  TABLET BY MOUTH TWICE DAILY AS NEEDED FOR  REFRACTORY NAUSEA/VOMITING. STARTING ON DAYS  4 AND 11 NOT ON DAY 8 30 tablet 1  . oxyCODONE-acetaminophen (PERCOCET) 10-325 MG tablet Take 1 tablet by mouth 2 (two) times daily. 10 tablet 0  . potassium chloride SA (K-DUR,KLOR-CON) 20 MEQ tablet Take 1 tablet (20 mEq total) by mouth daily. (Patient taking differently: Take 20 mEq by mouth daily with breakfast. )    . prochlorperazine (COMPAZINE) 10  MG tablet Take 1 tablet (10 mg total) by mouth every 6 (six) hours as needed (Nausea or vomiting). (Patient taking differently: Take 10 mg by mouth every 6 (six) hours as needed for nausea or vomiting. ) 30 tablet 1  . tamsulosin (FLOMAX) 0.4 MG CAPS capsule Take 1 capsule (0.4 mg total) by mouth daily after supper. 30 capsule 0  . Vitamin D, Ergocalciferol, (DRISDOL) 50000 units CAPS capsule TAKE 1 CAPSULE BY MOUTH ONCE A WEEK 12 capsule 1  . warfarin (COUMADIN) 4 MG tablet Take 1.5 tablets (6 mg total) by mouth daily. 30 tablet 0   No current facility-administered medications for this visit.    Facility-Administered Medications Ordered in Other Visits  Medication Dose Route Frequency Provider Last Rate Last Dose  . 0.9 %  sodium chloride infusion   Intravenous Continuous Brunetta Genera, MD 10 mL/hr at 07/09/17 1423      REVIEW OF SYSTEMS:    A 10+ POINT REVIEW OF SYSTEMS WAS OBTAINED including neurology, dermatology, psychiatry, cardiac, respiratory, lymph, extremities, GI, GU, Musculoskeletal, constitutional, breasts, reproductive, HEENT.  All pertinent positives are noted in the HPI.  All others are negative.   PHYSICAL EXAMINATION:  ECOG PERFORMANCE STATUS: 2 VS reviewed  GENERAL:alert, in no acute distress and comfortable SKIN: no acute rashes, no significant lesions EYES: conjunctiva are pink and non-injected, sclera anicteric OROPHARYNX: MMM, no exudates, no oropharyngeal erythema or ulceration NECK: supple, no JVD LYMPH:  no palpable  lymphadenopathy in the cervical, axillary or inguinal regions LUNGS: clear to auscultation b/l with normal respiratory effort HEART: regular rate & rhythm ABDOMEN:  normoactive bowel sounds , non tender, not distended. No palpable hepatosplenomegaly.  Extremity: no pedal edema PSYCH: alert & oriented x 3 with fluent speech NEURO: no focal motor/sensory deficits   LABORATORY DATA:  I have reviewed the data as listed . CBC Latest Ref Rng & Units 01/17/2019 11/22/2018 09/27/2018  WBC 4.0 - 10.5 K/uL 5.4 5.4 4.4  Hemoglobin 13.0 - 17.0 g/dL 12.8(L) 12.1(L) 12.7(L)  Hematocrit 39.0 - 52.0 % 41.6 38.2(L) 40.4  Platelets 150 - 400 K/uL 89(L) 97(L) 82(L)   . CBC    Component Value Date/Time   WBC 5.4 01/17/2019 0954   RBC 4.32 01/17/2019 0954   HGB 12.8 (L) 01/17/2019 0954   HGB 10.5 (L) 02/25/2018 1219   HGB 9.2 (L) 10/01/2017 1112   HCT 41.6 01/17/2019 0954   HCT 29.3 (L) 10/01/2017 1112   PLT 89 (L) 01/17/2019 0954   PLT 132 (L) 02/25/2018 1219   PLT 170 10/01/2017 1112   MCV 96.3 01/17/2019 0954   MCV 88.5 10/01/2017 1112   MCH 29.6 01/17/2019 0954   MCHC 30.8 01/17/2019 0954   RDW 15.2 01/17/2019 0954   RDW 15.3 (H) 10/01/2017 1112   LYMPHSABS 0.7 01/17/2019 0954   LYMPHSABS 0.5 (L) 10/01/2017 1112   MONOABS 0.6 01/17/2019 0954   MONOABS 0.5 10/01/2017 1112   EOSABS 0.2 01/17/2019 0954   EOSABS 0.1 10/01/2017 1112   BASOSABS 0.0 01/17/2019 0954   BASOSABS 0.0 10/01/2017 1112    . CMP Latest Ref Rng & Units 01/17/2019 11/22/2018 09/27/2018  Glucose 70 - 99 mg/dL 96 97 109(H)  BUN 8 - 23 mg/dL 35(H) 30(H) 30(H)  Creatinine 0.61 - 1.24 mg/dL 1.61(H) 1.47(H) 1.54(H)  Sodium 135 - 145 mmol/L 144 145 142  Potassium 3.5 - 5.1 mmol/L 3.8 4.0 4.3  Chloride 98 - 111 mmol/L 110 110 108  CO2 22 - 32 mmol/L 24  25 24  Calcium 8.9 - 10.3 mg/dL 9.2 9.1 9.0  Total Protein 6.5 - 8.1 g/dL 6.9 6.8 6.9  Total Bilirubin 0.3 - 1.2 mg/dL 0.9 1.0 0.7  Alkaline Phos 38 - 126 U/L 91 83 114  AST  15 - 41 U/L 26 23 33  ALT 0 - 44 U/L _0 Component     Latest Ref Rng & Units 01/17/2019  IgG (Immunoglobin G), Serum     603 - 1,613 mg/dL 953  IgA     61 - 437 mg/dL 54 (L)  IgM (Immunoglobulin M), Srm     15 - 143 mg/dL 28  Total Protein ELP     6.0 - 8.5 g/dL 6.2  Albumin SerPl Elph-Mcnc     2.9 - 4.4 g/dL 3.6  Alpha 1     0.0 - 0.4 g/dL 0.2  Alpha2 Glob SerPl Elph-Mcnc     0.4 - 1.0 g/dL 0.7  B-Globulin SerPl Elph-Mcnc     0.7 - 1.3 g/dL 0.9  Gamma Glob SerPl Elph-Mcnc     0.4 - 1.8 g/dL 0.9  M Protein SerPl Elph-Mcnc     Not Observed g/dL Not Observed  Globulin, Total     2.2 - 3.9 g/dL 2.6  Albumin/Glob SerPl     0.7 - 1.7 1.4  IFE 1      Comment  Please Note (HCV):      Comment  Kappa free light chain     3.3 - 19.4 mg/L 54.5 (H)  Lamda free light chains     5.7 - 26.3 mg/L 12.7  Kappa, lamda light chain ratio     0.26 - 1.65 4.29 (H)    RADIOGRAPHIC STUDIES: I have personally reviewed the radiological images as listed and agreed with the findings in the report. No results found.  ASSESSMENT & PLAN:   73 y.o. 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  K/L 95, No M spike on SPEP -- suggests light chain MM Cytogenetics - normal male chromosomes FISH- +11 and 13q-/-13  Patient is s/p 1 cycle of Vd and Serum free kappa LC has decreased from 700 to 203.6 with improvement in his K/L ratio from 94.59 to 29. Completed 2nd cycle of  treatment with Vd + Cytoxan (249m/m2) Completed 3rd cycle of treatment with Vd + Cytoxan (2015mm2) - cytoxan held D15 due to thrombocytopenia Completed 4th of VCd Discontinued VCd after C5D8 due to intolerance with diarrhea and increasing neuropathy and fatigue with borderline functional status. -Patient did not tolerate maintenance Revlimid 10 mg by mouth daily due to grade 2-3 diarrhea. This was discontinued and his diarrhea has resolved.  Currently on maintenance Ninlaro M spike absent   11/15/18 CT Pelvic revealed  3 mm stone in the right UVJ. 5 x 6 mm stone is identified in the left UVJ. No associated distal hydroureter. 2. Stable lucent lesion in the right iliac bone. 3. Small right groin hernia contains only fat. 11/15/18 CT Lumbar revealed  No acute fracture or malalignment. 2. Severe osteopenia and  multiple old compression fractures, severe at T11 and T12. 3. Severe canal stenosis L3-4. Multilevel neural foraminal narrowing: Moderate to severe at T12-L1. 4. Slowly enlarging solid LEFT renal mass. Recommend non emergent MRI renal mass protocol on non emergent basis -No obvious new myeloma involvement seen in most recent CT imaging.  PLAN:  -Discussed pt labwork today, 01/17/19; blood counts and chemistries are pending -01/17/19 MMP and SFLC stable. -The pt has no prohibitive toxicities from continuing 38m Ninlaro on D1, D8, and D15, at this time. -Advised that the pt not lift anything heavy and continue follow up with orthopedics -Discussed the left renal mass: Possibly Complex cyst? Recommend PCP Dr. YSandi Mariscalconsider urology referral. Pt notes he sees his PCP once a month. -Continue Vitamin D replacement  -Continue Aredia every 8 weeks -Continue B12 replacement -Continue acyclovir for shingles prophylaxis. -Continue aspirin -Will see the pt back in 2 months          2) CAD s/p prox LAD DES PCI on 07/31/2016 with ischemic cardiomyopathy ejection fraction of 25-35%. Rpt ECHO on  10/03/2016 shows improvement in EF to 55-60% with no RWMABN.  3) Macrocytic Anemia with moderate thrombocytopenia - due to MM and and Ninlaro. Stable.  4) Thrombocytopenia - due to MM and treatment --improved today 108k. --will monitor  5) B12 deficiency - Plan -cont SL B12 10021m po daily  6) h/o Afib with RVR on coumadin Some bradycardia with BB -continue mx per PCP and cardiology  7) Back pain -- multiple chronic compression fractures with some worsening -on bisphosphonates already -Ergocalciferol 50k units weekly -Advised that the pt not lift anything heavy  -Discussed the 11/15/18 CT imaging, as noted above, will refer pt to orthopedist   Plz schedule next dose of Pamidronate, labs and MD visit in 2 months   All of the patients questions were answered with apparent satisfaction. The patient knows to call the clinic with any problems, questions or concerns.  The total time spent in the appt was 20 minutes and more than 50% was on counseling and direct patient cares.   GaSullivan LoneD MSRanchos Penitas WestAHIVMS SCVa Medical Center - Battle CreekTAntelope Valley Surgery Center LPematology/Oncology Physician CoPawnee Valley Community Hospital(Office):       [phone] cell):  [phone]):           [phone], ScBaldwin Jamaicaam acting as a scribe for Dr. GaSullivan Lone  .I have reviewed the above documentation for accuracy and completeness, and I agree with the above. .GBrunetta GeneraD

## 2019-01-17 ENCOUNTER — Inpatient Hospital Stay: Payer: Medicare HMO

## 2019-01-17 ENCOUNTER — Other Ambulatory Visit: Payer: Self-pay

## 2019-01-17 ENCOUNTER — Inpatient Hospital Stay: Payer: Medicare HMO | Attending: Hematology | Admitting: Hematology

## 2019-01-17 ENCOUNTER — Telehealth: Payer: Self-pay | Admitting: Hematology

## 2019-01-17 VITALS — BP 92/55 | HR 66 | Temp 98.3°F | Resp 20 | Ht 69.0 in | Wt 218.4 lb

## 2019-01-17 VITALS — BP 133/62

## 2019-01-17 DIAGNOSIS — R001 Bradycardia, unspecified: Secondary | ICD-10-CM | POA: Insufficient documentation

## 2019-01-17 DIAGNOSIS — Z7189 Other specified counseling: Secondary | ICD-10-CM

## 2019-01-17 DIAGNOSIS — I4891 Unspecified atrial fibrillation: Secondary | ICD-10-CM | POA: Diagnosis not present

## 2019-01-17 DIAGNOSIS — D6959 Other secondary thrombocytopenia: Secondary | ICD-10-CM | POA: Diagnosis not present

## 2019-01-17 DIAGNOSIS — C9000 Multiple myeloma not having achieved remission: Secondary | ICD-10-CM

## 2019-01-17 DIAGNOSIS — M549 Dorsalgia, unspecified: Secondary | ICD-10-CM | POA: Diagnosis not present

## 2019-01-17 DIAGNOSIS — Z79899 Other long term (current) drug therapy: Secondary | ICD-10-CM | POA: Insufficient documentation

## 2019-01-17 DIAGNOSIS — Z794 Long term (current) use of insulin: Secondary | ICD-10-CM | POA: Insufficient documentation

## 2019-01-17 DIAGNOSIS — E119 Type 2 diabetes mellitus without complications: Secondary | ICD-10-CM | POA: Diagnosis not present

## 2019-01-17 DIAGNOSIS — Z7901 Long term (current) use of anticoagulants: Secondary | ICD-10-CM | POA: Diagnosis not present

## 2019-01-17 DIAGNOSIS — E538 Deficiency of other specified B group vitamins: Secondary | ICD-10-CM | POA: Insufficient documentation

## 2019-01-17 DIAGNOSIS — I10 Essential (primary) hypertension: Secondary | ICD-10-CM | POA: Diagnosis not present

## 2019-01-17 DIAGNOSIS — D649 Anemia, unspecified: Secondary | ICD-10-CM

## 2019-01-17 LAB — CMP (CANCER CENTER ONLY)
ALT: 24 U/L (ref 0–44)
AST: 26 U/L (ref 15–41)
Albumin: 3.8 g/dL (ref 3.5–5.0)
Alkaline Phosphatase: 91 U/L (ref 38–126)
Anion gap: 10 (ref 5–15)
BILIRUBIN TOTAL: 0.9 mg/dL (ref 0.3–1.2)
BUN: 35 mg/dL — ABNORMAL HIGH (ref 8–23)
CO2: 24 mmol/L (ref 22–32)
Calcium: 9.2 mg/dL (ref 8.9–10.3)
Chloride: 110 mmol/L (ref 98–111)
Creatinine: 1.61 mg/dL — ABNORMAL HIGH (ref 0.61–1.24)
GFR, EST NON AFRICAN AMERICAN: 42 mL/min — AB (ref >60)
GFR, Est AFR Am: 49 mL/min — ABNORMAL LOW (ref >60)
Glucose, Bld: 96 mg/dL (ref 70–99)
POTASSIUM: 3.8 mmol/L (ref 3.5–5.1)
Sodium: 144 mmol/L (ref 135–145)
Total Protein: 6.9 g/dL (ref 6.5–8.1)

## 2019-01-17 LAB — CBC WITH DIFFERENTIAL/PLATELET
Abs Immature Granulocytes: 0.02 10*3/uL (ref 0.00–0.07)
Basophils Absolute: 0 10*3/uL (ref 0.0–0.1)
Basophils Relative: 1 %
EOS ABS: 0.2 10*3/uL (ref 0.0–0.5)
Eosinophils Relative: 3 %
HCT: 41.6 % (ref 39.0–52.0)
Hemoglobin: 12.8 g/dL — ABNORMAL LOW (ref 13.0–17.0)
Immature Granulocytes: 0 %
LYMPHS ABS: 0.7 10*3/uL (ref 0.7–4.0)
Lymphocytes Relative: 13 %
MCH: 29.6 pg (ref 26.0–34.0)
MCHC: 30.8 g/dL (ref 30.0–36.0)
MCV: 96.3 fL (ref 80.0–100.0)
Monocytes Absolute: 0.6 10*3/uL (ref 0.1–1.0)
Monocytes Relative: 11 %
Neutro Abs: 3.9 10*3/uL (ref 1.7–7.7)
Neutrophils Relative %: 72 %
Platelets: 89 10*3/uL — ABNORMAL LOW (ref 150–400)
RBC: 4.32 MIL/uL (ref 4.22–5.81)
RDW: 15.2 % (ref 11.5–15.5)
WBC: 5.4 10*3/uL (ref 4.0–10.5)
nRBC: 0 % (ref 0.0–0.2)

## 2019-01-17 MED ORDER — SODIUM CHLORIDE 0.9 % IV SOLN
60.0000 mg | Freq: Once | INTRAVENOUS | Status: DC
  Administered 2019-01-17: 60 mg via INTRAVENOUS
  Filled 2019-01-17: qty 20

## 2019-01-17 MED ORDER — SODIUM CHLORIDE 0.9 % IV SOLN
INTRAVENOUS | Status: DC
  Administered 2019-01-17: 12:00:00 via INTRAVENOUS
  Filled 2019-01-17: qty 250

## 2019-01-17 NOTE — Patient Instructions (Signed)
Pamidronate injection What is this medicine? PAMIDRONATE (pa mi DROE nate) slows calcium loss from bones. It is used to treat high calcium blood levels from cancer or Paget's disease. It is also used to treat bone pain and prevent fractures from certain cancers that have spread to the bone. This medicine may be used for other purposes; ask your health care provider or pharmacist if you have questions. COMMON BRAND NAME(S): Aredia What should I tell my health care provider before I take this medicine? They need to know if you have any of these conditions: -aspirin-sensitive asthma -dental disease -kidney disease -an unusual or allergic reaction to pamidronate, other medicines, foods, dyes, or preservatives -pregnant or trying to get pregnant -breast-feeding How should I use this medicine? This medicine is for infusion into a vein. It is given by a health care professional in a hospital or clinic setting. Talk to your pediatrician regarding the use of this medicine in children. This medicine is not approved for use in children. Overdosage: If you think you have taken too much of this medicine contact a poison control center or emergency room at once. NOTE: This medicine is only for you. Do not share this medicine with others. What if I miss a dose? This does not apply. What may interact with this medicine? -certain antibiotics given by injection -medicines for inflammation or pain like ibuprofen, naproxen -some diuretics like bumetanide, furosemide -cyclosporine -parathyroid hormone -tacrolimus -teriparatide -thalidomide This list may not describe all possible interactions. Give your health care provider a list of all the medicines, herbs, non-prescription drugs, or dietary supplements you use. Also tell them if you smoke, drink alcohol, or use illegal drugs. Some items may interact with your medicine. What should I watch for while using this medicine? Visit your doctor or health care  professional for regular checkups. It may be some time before you see the benefit from this medicine. Do not stop taking your medicine unless your doctor tells you to. Your doctor may order blood tests or other tests to see how you are doing. Women should inform their doctor if they wish to become pregnant or think they might be pregnant. There is a potential for serious side effects to an unborn child. Talk to your health care professional or pharmacist for more information. You should make sure that you get enough calcium and vitamin D while you are taking this medicine. Discuss the foods you eat and the vitamins you take with your health care professional. Some people who take this medicine have severe bone, joint, and/or muscle pain. This medicine may also increase your risk for a broken thigh bone. Tell your doctor right away if you have pain in your upper leg or groin. Tell your doctor if you have any pain that does not go away or that gets worse. What side effects may I notice from receiving this medicine? Side effects that you should report to your doctor or health care professional as soon as possible: -allergic reactions like skin rash, itching or hives, swelling of the face, lips, or tongue -black or tarry stools -changes in vision -eye inflammation, pain -high blood pressure -jaw pain, especially burning or cramping -muscle weakness -numb, tingling pain -swelling of feet or hands -trouble passing urine or change in the amount of urine -unable to move easily Side effects that usually do not require medical attention (report to your doctor or health care professional if they continue or are bothersome): -bone, joint, or muscle pain -constipation -dizzy, drowsy -  trouble passing urine or change in the amount of urine  -unable to move easily  Side effects that usually do not require medical attention (report to your doctor or health care professional if they continue or are bothersome):  -bone, joint, or muscle pain  -constipation  -dizzy, drowsy  -fever  -headache  -loss of appetite  -nausea, vomiting  -pain at site where injected  This list may not describe all possible side effects. Call your doctor for medical advice about side effects. You may report side effects to  FDA at 1-800-FDA-1088.  Where should I keep my medicine?  This drug is given in a hospital or clinic and will not be stored at home.  NOTE: This sheet is a summary. It may not cover all possible information. If you have questions about this medicine, talk to your doctor, pharmacist, or health care provider.   2019 Elsevier/Gold Standard (2011-04-04 08:49:49)

## 2019-01-17 NOTE — Telephone Encounter (Signed)
Scheduled appt per 3/30 los.  Printed and mailed calendar and avs.

## 2019-01-18 LAB — MULTIPLE MYELOMA PANEL, SERUM
Albumin SerPl Elph-Mcnc: 3.6 g/dL (ref 2.9–4.4)
Albumin/Glob SerPl: 1.4 (ref 0.7–1.7)
Alpha 1: 0.2 g/dL (ref 0.0–0.4)
Alpha2 Glob SerPl Elph-Mcnc: 0.7 g/dL (ref 0.4–1.0)
B-Globulin SerPl Elph-Mcnc: 0.9 g/dL (ref 0.7–1.3)
Gamma Glob SerPl Elph-Mcnc: 0.9 g/dL (ref 0.4–1.8)
Globulin, Total: 2.6 g/dL (ref 2.2–3.9)
IGA: 54 mg/dL — AB (ref 61–437)
IgG (Immunoglobin G), Serum: 953 mg/dL (ref 603–1613)
IgM (Immunoglobulin M), Srm: 28 mg/dL (ref 15–143)
Total Protein ELP: 6.2 g/dL (ref 6.0–8.5)

## 2019-01-18 LAB — KAPPA/LAMBDA LIGHT CHAINS
Kappa free light chain: 54.5 mg/L — ABNORMAL HIGH (ref 3.3–19.4)
Kappa, lambda light chain ratio: 4.29 — ABNORMAL HIGH (ref 0.26–1.65)
Lambda free light chains: 12.7 mg/L (ref 5.7–26.3)

## 2019-01-18 MED FILL — NINLARO 3 MG CAPSULE: 3 | 28 days supply | Qty: 3 | Fill #1

## 2019-01-26 ENCOUNTER — Other Ambulatory Visit: Payer: Self-pay | Admitting: Hematology

## 2019-01-26 DIAGNOSIS — C9000 Multiple myeloma not having achieved remission: Secondary | ICD-10-CM

## 2019-02-05 ENCOUNTER — Other Ambulatory Visit: Payer: Self-pay | Admitting: Hematology

## 2019-02-05 DIAGNOSIS — C9000 Multiple myeloma not having achieved remission: Secondary | ICD-10-CM

## 2019-02-16 MED FILL — NINLARO 3 MG CAPSULE: 3 | 28 days supply | Qty: 3 | Fill #0

## 2019-02-23 ENCOUNTER — Other Ambulatory Visit: Payer: Self-pay | Admitting: Hematology

## 2019-02-23 DIAGNOSIS — C9000 Multiple myeloma not having achieved remission: Secondary | ICD-10-CM

## 2019-03-15 ENCOUNTER — Other Ambulatory Visit: Payer: Self-pay

## 2019-03-15 ENCOUNTER — Ambulatory Visit: Payer: Medicare HMO | Admitting: Orthopaedic Surgery

## 2019-03-15 ENCOUNTER — Encounter: Payer: Self-pay | Admitting: Orthopaedic Surgery

## 2019-03-15 VITALS — Ht 70.0 in | Wt 218.0 lb

## 2019-03-15 DIAGNOSIS — T07XXXA Unspecified multiple injuries, initial encounter: Secondary | ICD-10-CM | POA: Diagnosis not present

## 2019-03-15 DIAGNOSIS — M48062 Spinal stenosis, lumbar region with neurogenic claudication: Secondary | ICD-10-CM

## 2019-03-15 DIAGNOSIS — C9000 Multiple myeloma not having achieved remission: Secondary | ICD-10-CM | POA: Diagnosis not present
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
## 2019-03-17 MED FILL — NINLARO 3 MG CAPSULE: 3 | 28 days supply | Qty: 3 | Fill #1

## 2019-03-17 NOTE — Progress Notes (Signed)
Richard Kitchen    HEMATOLOGY/ONCOLOGY CLINIC NOTE  Date of Service: 03/18/19    Patient Care Team: Sandi Mariscal, MD as PCP - General (Internal Medicine)  CHIEF COMPLAINTS/PURPOSE OF CONSULTATION:   F/u for continued management of Myeloma   HISTORY OF PRESENTING ILLNESS:  plz see previous note for details on HPI  INTERVAL HISTORY   Richard Vang is here for his scheduled followup for multiple myeloma. The patient's last visit with [name] was on 03/15/19. The pt reports that he is doing well overall.  The pt reports that he followed up with orthopedist earlier this week for his lower back pain, and will follow up again in 6 months. The pt notes that he did not receive any steroid injections or other interventions. The pt notes that he continues to have some intermittent numbness in his feet which he denies having changed since our last visit. He denies leg or ankle swelling. The pt notes that he has been isolating at home during the pandemic and is walking around his home each day. He denies concerns for infections. He denies new bone pains or dental concerns at this time.  Lab results today (03/18/19) of CBC w/diff and CMP is as follows: all values are WNL except for HGB at 12Vang5, PLT at 120k, Lymphs abs at 600, Glucose at 119, BUN at 46, Creatinine at 1Vang73, GFR at 39. 03/18/19 MMP and SFLC are pending 03/18/19 Vitamin D25 hydroxy is pending  On review of systems, pt reports stable intermittent feet numbness, stable lower back pain, staying somewhat active, stable energy levels, moving his bowels well, and denies leg swelling, ankle swelling, new bone pains, concerns for infections, dental concerns, and any other symptoms.    MEDICAL HISTORY:  Past Medical History:  Diagnosis Date   Arthritis    "left shoulder" (07/31/2016)   Coronary artery disease    GERD (gastroesophageal reflux disease)    Heart murmur    High cholesterol    Hypertension    Multiple myeloma ([place])    Sleep apnea    "probably;  having test in November" (07/31/2016)   Type II diabetes mellitus ([place])     SURGICAL HISTORY: Past Surgical History:  Procedure Laterality Date   CARDIAC CATHETERIZATION N/A 07/31/2016   Procedure: Left Heart Cath and Coronary Angiography;  Surgeon: Lorretta Harp, MD;  Location: [facility];  Service: Cardiovascular;  Laterality: N/A;   CARDIAC CATHETERIZATION N/A 07/31/2016   Procedure: Coronary Stent Intervention;  Surgeon: Lorretta Harp, MD;  Location: [facility];  Service: Cardiovascular;  Laterality: N/A;   CORONARY ANGIOPLASTY     OPEN REDUCTION INTERNAL FIXATION (ORIF) METACARPAL Left 05/21/2018   Procedure: OPEN REDUCTION INTERNAL FIXATION (ORIF) 2ND METACARPAL;  Surgeon: Shona Needles, MD;  Location: [facility];  Service: Orthopedics;  Laterality: Left;   ORIF CLAVICULAR FRACTURE Right 05/21/2018   Procedure: OPEN REDUCTION INTERNAL FIXATION (ORIF) CLAVICULAR FRACTURE;  Surgeon: Shona Needles, MD;  Location: [facility];  Service: Orthopedics;  Laterality: Right;   SHOULDER SURGERY Left 1973   "put pin in it where it had separated"    TONSILLECTOMY  ~ 1956    SOCIAL HISTORY: Social History   Socioeconomic History   Marital status: Divorced    Spouse name: Not on file   Number of children: Not on file   Years of education: Not on file   Highest education level: Not on file  Occupational History   Not on file  Social Needs  Financial resource strain: Not on file   Food insecurity:    Worry: Not on file    Inability: Not on file   Transportation needs:    Medical: Not on file    Non-medical: Not on file  Tobacco Use   Smoking status: Never Smoker   Smokeless tobacco: Never Used  Substance and Sexual Activity   Alcohol use: Yes    Comment: 07/31/2016 "nothing since 2002"   Drug use: No   Sexual activity: Not Currently  Lifestyle   Physical activity:    Days per week: Not on file    Minutes per session: Not on file   Stress:  Not on file  Relationships   Social connections:    Talks on phone: Not on file    Gets together: Not on file    Attends religious service: Not on file    Active member of club or organization: Not on file    Attends meetings of clubs or organizations: Not on file    Relationship status: Not on file   Intimate partner violence:    Fear of current or ex partner: Not on file    Emotionally abused: Not on file    Physically abused: Not on file    Forced sexual activity: Not on file  Other Topics Concern   Not on file  Social History Narrative   Not on file    FAMILY HISTORY: Family History  Problem Relation Age of Onset   Hypertension Other     ALLERGIES:  has No Known Allergies.  MEDICATIONS:  Current Outpatient Medications  Medication Sig Dispense Refill   acyclovir (ZOVIRAX) 400 MG tablet Take 1 tablet by mouth twice daily 60 tablet 0   amLODipine (NORVASC) 5 MG tablet Take 5 mg by mouth daily.     atorvastatin (LIPITOR) 40 MG tablet Take 1 tablet (40 mg total) by mouth at bedtime. 30 tablet 12   carvedilol (COREG) 12Vang5 MG tablet Take 12Vang5 mg by mouth 2 (two) times daily with a meal.     clopidogrel (PLAVIX) 75 MG tablet Take 1 tablet (75 mg total) by mouth daily with breakfast. 30 tablet 12   Cyanocobalamin (VITAMIN B-12) 2500 MCG SUBL Place 1 tablet under the tongue daily with breakfast.     cyclobenzaprine (FLEXERIL) 10 MG tablet Take 10 mg by mouth daily as needed for pain.  2   erythromycin ophthalmic ointment      furosemide (LASIX) 40 MG tablet Take 40 mg by mouth daily with breakfast.      insulin aspart (NOVOLOG) 100 UNIT/ML injection Inject 0-9 Units into the skin every 4 (four) hours. 10 mL 11   montelukast (SINGULAIR) 10 MG tablet Take 10 mg by mouth daily.     NINLARO 3 MG capsule TAKE 1 CAPSULE (3 MG TOTAL) BY MOUTH ONCE A WEEK. ON DAY 1,8,15 EVERY 28 DAYS. TAKE ON AN EMPTY STOMACH 1HR BEFORE OR 2HRS AFTER FOOD. 3 capsule 1   nitroGLYCERIN  (NITROSTAT) 0Vang3 MG SL tablet Place 0Vang3 mg under the tongue every 5 (five) minutes as needed for chest pain.      omeprazole (PRILOSEC) 40 MG capsule Take 40 mg by mouth daily with breakfast.      ondansetron (ZOFRAN) 8 MG tablet TAKE 1 TABLET BY MOUTH TWICE DAILY AS NEEDED FOR  REFRACTORY NAUSEA/VOMITING. STARTING ON DAYS  4 AND 11 NOT ON DAY 8 30 tablet 1   oxyCODONE-acetaminophen (PERCOCET) 10-325 MG tablet Take 1 tablet by  mouth 2 (two) times daily. 10 tablet 0   potassium chloride SA (K-DUR,KLOR-CON) 20 MEQ tablet Take 1 tablet (20 mEq total) by mouth daily. (Patient taking differently: Take 20 mEq by mouth daily with breakfast. )     prochlorperazine (COMPAZINE) 10 MG tablet Take 1 tablet (10 mg total) by mouth every 6 (six) hours as needed (Nausea or vomiting). (Patient taking differently: Take 10 mg by mouth every 6 (six) hours as needed for nausea or vomiting. ) 30 tablet 1   tamsulosin (FLOMAX) 0Vang4 MG CAPS capsule Take 1 capsule (0Vang4 mg total) by mouth daily after supper. 30 capsule 0   Vitamin D, Ergocalciferol, (DRISDOL) 1Vang25 MG (50000 UT) CAPS capsule Take 1 capsule by mouth once a week 12 capsule 0   warfarin (COUMADIN) 4 MG tablet Take 1Vang5 tablets (6 mg total) by mouth daily. 30 tablet 0   No current facility-administered medications for this visit.    Facility-Administered Medications Ordered in Other Visits  Medication Dose Route Frequency Provider Last Rate Last Dose   0Vang9 %  sodium chloride infusion   Intravenous Continuous Brunetta Genera, MD 10 mL/hr at 07/09/17 1423      REVIEW OF SYSTEMS:    A 10+ POINT REVIEW OF SYSTEMS WAS OBTAINED including neurology, dermatology, psychiatry, cardiac, respiratory, lymph, extremities, GI, GU, Musculoskeletal, constitutional, breasts, reproductive, HEENT.  All pertinent positives are noted in the HPI.  All others are negative.   PHYSICAL EXAMINATION:  ECOG PERFORMANCE STATUS: 2 VS reviewed  GENERAL:alert, in no acute  distress and comfortable SKIN: no acute rashes, no significant lesions EYES: conjunctiva are pink and non-injected, sclera anicteric OROPHARYNX: MMM, no exudates, no oropharyngeal erythema or ulceration NECK: supple, no JVD LYMPH:  no palpable lymphadenopathy in the cervical, axillary or inguinal regions LUNGS: clear to auscultation b/l with normal respiratory effort HEART: regular rate & rhythm ABDOMEN:  normoactive bowel sounds , non tender, not distended. No palpable hepatosplenomegaly.  Extremity: no pedal edema PSYCH: alert & oriented x 3 with fluent speech NEURO: no focal motor/sensory deficits   LABORATORY DATA:  I have reviewed the data as listed . CBC Latest Ref Rng & Units 03/18/2019 01/17/2019 11/22/2018  WBC 4Vang0 - 10Vang5 K/uL 6Vang3 5Vang4 5Vang4  Hemoglobin 13Vang0 - 17Vang0 g/dL 12Vang5(L) 12Vang8(L) 12Vang1(L)  Hematocrit 39Vang0 - 52Vang0 % 40Vang4 41Vang6 38Vang2(L)  Platelets 150 - 400 K/uL 120(L) 89(L) 97(L)   . CBC    Component Value Date/Time   WBC 6Vang3 03/18/2019 0941   RBC 4Vang28 03/18/2019 0941   HGB 12Vang5 (L) 03/18/2019 0941   HGB 10Vang5 (L) 02/25/2018 1219   HGB 9Vang2 (L) 10/01/2017 1112   HCT 40Vang4 03/18/2019 0941   HCT 29Vang3 (L) 10/01/2017 1112   PLT 120 (L) 03/18/2019 0941   PLT 132 (L) 02/25/2018 1219   PLT 170 10/01/2017 1112   MCV 94Vang4 03/18/2019 0941   MCV 88Vang5 10/01/2017 1112   MCH 29Vang2 03/18/2019 0941   MCHC 30Vang9 03/18/2019 0941   RDW 15Vang0 03/18/2019 0941   RDW 15Vang3 (H) 10/01/2017 1112   LYMPHSABS 0Vang6 (L) 03/18/2019 0941   LYMPHSABS 0Vang5 (L) 10/01/2017 1112   MONOABS 0Vang5 03/18/2019 0941   MONOABS 0Vang5 10/01/2017 1112   EOSABS 0Vang1 03/18/2019 0941   EOSABS 0Vang1 10/01/2017 1112   BASOSABS 0Vang0 03/18/2019 0941   BASOSABS 0Vang0 10/01/2017 1112    . CMP Latest Ref Rng & Units 03/18/2019 01/17/2019 11/22/2018  Glucose 70 - 99 mg/dL 119(H) 96 97  BUN 8 -  23 mg/dL 46(H) 35(H) 30(H)  Creatinine 0Vang61 - 1Vang24 mg/dL 1Vang73(H) 1Vang61(H) 1Vang47(H)  Sodium 135 - 145 mmol/L 142 144 145  Potassium 3Vang5 - 5Vang1  mmol/L 4Vang6 3Vang8 4Vang0  Chloride 98 - 111 mmol/L 110 110 110  CO2 22 - 32 mmol/L _0 Calcium 8Vang9 - 10Vang3 mg/dL 8Vang9 9Vang2 9Vang1  Total Protein 6Vang5 - 8Vang1 g/dL 6Vang8 6Vang9 6Vang8  Total Bilirubin 0Vang3 - 1Vang2 mg/dL 0Vang8 0Vang9 1Vang0  Alkaline Phos 38 - 126 U/L 88 91 83  AST 15 - 41 U/L _1 ALT 0 - 44 U/L _2 Component     Latest Ref Rng & Units 01/17/2019  IgG (Immunoglobin G), Serum     603 - 1,613 mg/dL 953  IgA     61 - 437 mg/dL 54 (L)  IgM (Immunoglobulin M), Srm     15 - 143 mg/dL 28  Total Protein ELP     6Vang0 - 8Vang5 g/dL 6Vang2  Albumin SerPl Elph-Mcnc     2Vang9 - 4Vang4 g/dL 3Vang6  Alpha 1     0Vang0 - 0Vang4 g/dL 0Vang2  Alpha2 Glob SerPl Elph-Mcnc     0Vang4 - 1Vang0 g/dL 0Vang7  B-Globulin SerPl Elph-Mcnc     0Vang7 - 1Vang3 g/dL 0Vang9  Gamma Glob SerPl Elph-Mcnc     0Vang4 - 1Vang8 g/dL 0Vang9  M Protein SerPl Elph-Mcnc     Not Observed g/dL Not Observed  Globulin, Total     2Vang2 - 3Vang9 g/dL 2Vang6  Albumin/Glob SerPl     0Vang7 - 1Vang7 1Vang4  IFE 1      Comment  Please Note (HCV):      Comment  Kappa free light chain     3Vang3 - 19Vang4 mg/L 54Vang5 (H)  Lamda free light chains     5Vang7 - 26Vang3 mg/L 12Vang7  Kappa, lamda light chain ratio     0Vang26 - 1Vang65 4Vang29 (H)    RADIOGRAPHIC STUDIES: I have personally reviewed the radiological images as listed and agreed with the findings in the report. No results found.  ASSESSMENT & PLAN:   73 yVango. 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  K/L 95, No M spike on SPEP  -- suggests light chain MM Cytogenetics - normal male chromosomes FISH- +11 and 13q-/-13  Patient is s/p 1 cycle of Vd and Serum free kappa LC has decreased from 700 to 203Vang6 with improvement in his K/L ratio from 94Vang59 to 29. Completed 2nd cycle of treatment with Vd + Cytoxan (252m/m2) Completed 3rd cycle of treatment with Vd + Cytoxan (2034mm2) - cytoxan held D15 due to thrombocytopenia Completed 4th of VCd Discontinued VCd after C5D8 due to intolerance with diarrhea and increasing neuropathy and fatigue with borderline functional status. -Patient did not tolerate maintenance Revlimid 10 mg by mouth daily due to grade 2-3 diarrhea. This was discontinued and his diarrhea has resolved.  Currently on maintenance Ninlaro M spike absent   11/15/18 CT Pelvic revealed  3 mm stone in the  right UVJ. 5 x 6 mm stone is identified in the left UVJ. No associated distal hydroureter. 2. Stable lucent lesion in the right iliac bone. 3. Small right groin hernia contains only fat. 11/15/18 CT Lumbar revealed  No acute fracture or malalignment. 2. Severe osteopenia and multiple old compression fractures, severe at T11 and T12. 3. Severe canal stenosis L3-4. Multilevel neural foraminal narrowing: Moderate to severe at T12-L1. 4. Slowly enlarging solid LEFT renal mass. Recommend non emergent MRI renal mass protocol on non emergent basis -No obvious new myeloma involvement seen in most recent CT imaging.  PLAN:  -Discussed pt labwork today, 03/18/19; PLT improved to 120k, blood counts otherwise stable. Creatinine at 1Vang73 and BUN at 46, otherwise chemistries are stable -03/18/19 MMP and SFLC are pending. Last available SFLC fro 01/17/19 revealed Kappa light chains stable at 54Vang20m and K:L ratio improved to 4Vang29 -The pt has no prohibitive toxicities from continuing 359mNinlaro on D1, D8, and D15 at this time. -Recommend a Vitamin B complex and Vitamin B12 -Advised that the pt not lift anything heavy and continue  follow up with orthopedics -Discussed the left renal mass: Possibly Complex cyst? Recommend PCP Dr. YuSandi Mariscalonsider urology referral. Pt notes he sees his PCP once a month. -Continue Vitamin D replacement weekly -Continue Aredia every 8 weeks -Continue B12 replacement -Continue acyclovir for shingles prophylaxis. -Continue aspirin -Will see the pt back in 8 weeks          2) CAD s/p prox LAD DES PCI on 07/31/2016 with ischemic cardiomyopathy ejection fraction of 25-35%. Rpt ECHO on 10/03/2016 shows improvement in EF to 55-60% with no RWMABN.  3) Macrocytic Anemia with moderate thrombocytopenia - due to MM and and Ninlaro. Stable.  4) Thrombocytopenia - due to MM and treatment --improved today 108k. --will monitor  5) B12 deficiency - Plan -cont SL B12 100027mpo daily  6) h/o Afib with RVR on coumadin Some bradycardia with BB -continue mx per PCP and cardiology  7) Back pain -- multiple chronic compression fractures with some worsening -on bisphosphonates already -Ergocalciferol 50k units weekly -Advised that the pt not lift anything heavy  -Discussed the 11/15/18 CT imaging, as noted above, did refer pt to orthopedist   Plz schedule next dose of Pamidronate, labs and MD visit in 2 months.   All of the patients questions were answered with apparent satisfaction. The patient knows to call the clinic with any problems, questions or concerns.  The total time spent in the appt was 20 minutes and more than 50% was on counseling and direct patient cares.   GauSullivan Lone MS [place] HeightsHIVMS SCHMercy HospitalHVision Group Asc LLCmatology/Oncology Physician ConHosp Municipal De San Juan Dr Rafael Lopez NussaOffice):       [phone] cell):  [phone]):           [phone], SchBaldwin Jamaicam acting as a scribe for Dr. GauSullivan Lone .I have reviewed the above documentation for accuracy and completeness, and I agree with the above. .GaBrunetta Genera

## 2019-03-18 ENCOUNTER — Inpatient Hospital Stay (HOSPITAL_BASED_OUTPATIENT_CLINIC_OR_DEPARTMENT_OTHER): Payer: Medicare HMO | Admitting: Hematology

## 2019-03-18 ENCOUNTER — Other Ambulatory Visit: Payer: Self-pay

## 2019-03-18 ENCOUNTER — Inpatient Hospital Stay: Payer: Medicare HMO | Attending: Hematology

## 2019-03-18 ENCOUNTER — Inpatient Hospital Stay: Payer: Medicare HMO

## 2019-03-18 VITALS — BP 115/52 | HR 77 | Temp 97.8°F | Resp 18 | Ht 70.0 in | Wt 225.7 lb

## 2019-03-18 DIAGNOSIS — R200 Anesthesia of skin: Secondary | ICD-10-CM | POA: Diagnosis not present

## 2019-03-18 DIAGNOSIS — I10 Essential (primary) hypertension: Secondary | ICD-10-CM | POA: Diagnosis not present

## 2019-03-18 DIAGNOSIS — I4891 Unspecified atrial fibrillation: Secondary | ICD-10-CM | POA: Diagnosis not present

## 2019-03-18 DIAGNOSIS — Z794 Long term (current) use of insulin: Secondary | ICD-10-CM | POA: Diagnosis not present

## 2019-03-18 DIAGNOSIS — E538 Deficiency of other specified B group vitamins: Secondary | ICD-10-CM

## 2019-03-18 DIAGNOSIS — D6959 Other secondary thrombocytopenia: Secondary | ICD-10-CM | POA: Insufficient documentation

## 2019-03-18 DIAGNOSIS — Z7902 Long term (current) use of antithrombotics/antiplatelets: Secondary | ICD-10-CM | POA: Insufficient documentation

## 2019-03-18 DIAGNOSIS — M549 Dorsalgia, unspecified: Secondary | ICD-10-CM | POA: Insufficient documentation

## 2019-03-18 DIAGNOSIS — G4730 Sleep apnea, unspecified: Secondary | ICD-10-CM | POA: Diagnosis not present

## 2019-03-18 DIAGNOSIS — C9000 Multiple myeloma not having achieved remission: Secondary | ICD-10-CM | POA: Diagnosis present

## 2019-03-18 DIAGNOSIS — M1990 Unspecified osteoarthritis, unspecified site: Secondary | ICD-10-CM

## 2019-03-18 DIAGNOSIS — I2510 Atherosclerotic heart disease of native coronary artery without angina pectoris: Secondary | ICD-10-CM | POA: Diagnosis not present

## 2019-03-18 DIAGNOSIS — E119 Type 2 diabetes mellitus without complications: Secondary | ICD-10-CM | POA: Diagnosis not present

## 2019-03-18 DIAGNOSIS — G8929 Other chronic pain: Secondary | ICD-10-CM | POA: Insufficient documentation

## 2019-03-18 DIAGNOSIS — I255 Ischemic cardiomyopathy: Secondary | ICD-10-CM | POA: Diagnosis not present

## 2019-03-18 DIAGNOSIS — D696 Thrombocytopenia, unspecified: Secondary | ICD-10-CM

## 2019-03-18 DIAGNOSIS — E7800 Pure hypercholesterolemia, unspecified: Secondary | ICD-10-CM | POA: Diagnosis not present

## 2019-03-18 DIAGNOSIS — Z79899 Other long term (current) drug therapy: Secondary | ICD-10-CM

## 2019-03-18 DIAGNOSIS — K219 Gastro-esophageal reflux disease without esophagitis: Secondary | ICD-10-CM | POA: Insufficient documentation

## 2019-03-18 DIAGNOSIS — Z7189 Other specified counseling: Secondary | ICD-10-CM

## 2019-03-18 DIAGNOSIS — D469 Myelodysplastic syndrome, unspecified: Secondary | ICD-10-CM | POA: Diagnosis not present

## 2019-03-18 DIAGNOSIS — Z7901 Long term (current) use of anticoagulants: Secondary | ICD-10-CM | POA: Diagnosis not present

## 2019-03-18 LAB — CBC WITH DIFFERENTIAL/PLATELET
Abs Immature Granulocytes: 0.03 10*3/uL (ref 0.00–0.07)
Basophils Absolute: 0 10*3/uL (ref 0.0–0.1)
Basophils Relative: 0 %
Eosinophils Absolute: 0.1 10*3/uL (ref 0.0–0.5)
Eosinophils Relative: 2 %
HCT: 40.4 % (ref 39.0–52.0)
Hemoglobin: 12.5 g/dL — ABNORMAL LOW (ref 13.0–17.0)
Immature Granulocytes: 1 %
Lymphocytes Relative: 9 %
Lymphs Abs: 0.6 10*3/uL — ABNORMAL LOW (ref 0.7–4.0)
MCH: 29.2 pg (ref 26.0–34.0)
MCHC: 30.9 g/dL (ref 30.0–36.0)
MCV: 94.4 fL (ref 80.0–100.0)
Monocytes Absolute: 0.5 10*3/uL (ref 0.1–1.0)
Monocytes Relative: 9 %
Neutro Abs: 5 10*3/uL (ref 1.7–7.7)
Neutrophils Relative %: 79 %
Platelets: 120 10*3/uL — ABNORMAL LOW (ref 150–400)
RBC: 4.28 MIL/uL (ref 4.22–5.81)
RDW: 15 % (ref 11.5–15.5)
WBC: 6.3 10*3/uL (ref 4.0–10.5)
nRBC: 0 % (ref 0.0–0.2)

## 2019-03-18 LAB — CMP (CANCER CENTER ONLY)
ALT: 19 U/L (ref 0–44)
AST: 24 U/L (ref 15–41)
Albumin: 3.8 g/dL (ref 3.5–5.0)
Alkaline Phosphatase: 88 U/L (ref 38–126)
Anion gap: 9 (ref 5–15)
BUN: 46 mg/dL — ABNORMAL HIGH (ref 8–23)
CO2: 23 mmol/L (ref 22–32)
Calcium: 8.9 mg/dL (ref 8.9–10.3)
Chloride: 110 mmol/L (ref 98–111)
Creatinine: 1.73 mg/dL — ABNORMAL HIGH (ref 0.61–1.24)
GFR, Est AFR Am: 45 mL/min — ABNORMAL LOW (ref >60)
GFR, Estimated: 39 mL/min — ABNORMAL LOW (ref >60)
Glucose, Bld: 119 mg/dL — ABNORMAL HIGH (ref 70–99)
Potassium: 4.6 mmol/L (ref 3.5–5.1)
Sodium: 142 mmol/L (ref 135–145)
Total Bilirubin: 0.8 mg/dL (ref 0.3–1.2)
Total Protein: 6.8 g/dL (ref 6.5–8.1)

## 2019-03-18 MED ORDER — SODIUM CHLORIDE 0.9 % IV SOLN
INTRAVENOUS | Status: DC
  Administered 2019-03-18: 13:00:00 via INTRAVENOUS
  Filled 2019-03-18: qty 250

## 2019-03-18 MED ORDER — SODIUM CHLORIDE 0.9 % IV SOLN
60.0000 mg | Freq: Once | INTRAVENOUS | Status: AC
  Administered 2019-03-18: 60 mg via INTRAVENOUS
  Filled 2019-03-18: qty 20

## 2019-03-18 NOTE — Patient Instructions (Signed)
Pamidronate injection What is this medicine? PAMIDRONATE (pa mi DROE nate) slows calcium loss from bones. It is used to treat high calcium blood levels from cancer or Paget's disease. It is also used to treat bone pain and prevent fractures from certain cancers that have spread to the bone. This medicine may be used for other purposes; ask your health care provider or pharmacist if you have questions. COMMON BRAND NAME(S): Aredia What should I tell my health care provider before I take this medicine? They need to know if you have any of these conditions: -aspirin-sensitive asthma -dental disease -kidney disease -an unusual or allergic reaction to pamidronate, other medicines, foods, dyes, or preservatives -pregnant or trying to get pregnant -breast-feeding How should I use this medicine? This medicine is for infusion into a vein. It is given by a health care professional in a hospital or clinic setting. Talk to your pediatrician regarding the use of this medicine in children. This medicine is not approved for use in children. Overdosage: If you think you have taken too much of this medicine contact a poison control center or emergency room at once. NOTE: This medicine is only for you. Do not share this medicine with others. What if I miss a dose? This does not apply. What may interact with this medicine? -certain antibiotics given by injection -medicines for inflammation or pain like ibuprofen, naproxen -some diuretics like bumetanide, furosemide -cyclosporine -parathyroid hormone -tacrolimus -teriparatide -thalidomide This list may not describe all possible interactions. Give your health care provider a list of all the medicines, herbs, non-prescription drugs, or dietary supplements you use. Also tell them if you smoke, drink alcohol, or use illegal drugs. Some items may interact with your medicine. What should I watch for while using this medicine? Visit your doctor or health care  professional for regular checkups. It may be some time before you see the benefit from this medicine. Do not stop taking your medicine unless your doctor tells you to. Your doctor may order blood tests or other tests to see how you are doing. Women should inform their doctor if they wish to become pregnant or think they might be pregnant. There is a potential for serious side effects to an unborn child. Talk to your health care professional or pharmacist for more information. You should make sure that you get enough calcium and vitamin D while you are taking this medicine. Discuss the foods you eat and the vitamins you take with your health care professional. Some people who take this medicine have severe bone, joint, and/or muscle pain. This medicine may also increase your risk for a broken thigh bone. Tell your doctor right away if you have pain in your upper leg or groin. Tell your doctor if you have any pain that does not go away or that gets worse. What side effects may I notice from receiving this medicine? Side effects that you should report to your doctor or health care professional as soon as possible: -allergic reactions like skin rash, itching or hives, swelling of the face, lips, or tongue -black or tarry stools -changes in vision -eye inflammation, pain -high blood pressure -jaw pain, especially burning or cramping -muscle weakness -numb, tingling pain -swelling of feet or hands -trouble passing urine or change in the amount of urine -unable to move easily Side effects that usually do not require medical attention (report to your doctor or health care professional if they continue or are bothersome): -bone, joint, or muscle pain -constipation -dizzy, drowsy -  fever -headache -loss of appetite -nausea, vomiting -pain at site where injected This list may not describe all possible side effects. Call your doctor for medical advice about side effects. You may report side effects to  FDA at 1-800-FDA-1088. Where should I keep my medicine? This drug is given in a hospital or clinic and will not be stored at home. NOTE: This sheet is a summary. It may not cover all possible information. If you have questions about this medicine, talk to your doctor, pharmacist, or health care provider.  2019 Elsevier/Gold Standard (2011-04-04 08:49:49)

## 2019-03-19 ENCOUNTER — Telehealth: Payer: Self-pay | Admitting: Cardiology

## 2019-03-19 DIAGNOSIS — Z20822 Contact with and (suspected) exposure to covid-19: Secondary | ICD-10-CM

## 2019-03-19 LAB — VITAMIN D 25 HYDROXY (VIT D DEFICIENCY, FRACTURES): Vit D, 25-Hydroxy: 35.2 ng/mL (ref 30.0–100.0)

## 2019-03-19 NOTE — Telephone Encounter (Signed)
Spoke with wife [name], informed her that patient was potentially exposed to Covid on his recent office visit 5/26, Covid order place.  Appt 5/31 Sunday 3:45

## 2019-03-20 ENCOUNTER — Other Ambulatory Visit: Payer: Medicare HMO

## 2019-03-20 DIAGNOSIS — Z20822 Contact with and (suspected) exposure to covid-19: Secondary | ICD-10-CM

## 2019-03-21 ENCOUNTER — Telehealth: Payer: Self-pay | Admitting: Hematology

## 2019-03-21 LAB — MULTIPLE MYELOMA PANEL, SERUM
Albumin SerPl Elph-Mcnc: 3.8 g/dL (ref 2.9–4.4)
Albumin/Glob SerPl: 1.6 (ref 0.7–1.7)
Alpha 1: 0.2 g/dL (ref 0.0–0.4)
Alpha2 Glob SerPl Elph-Mcnc: 0.7 g/dL (ref 0.4–1.0)
B-Globulin SerPl Elph-Mcnc: 0.8 g/dL (ref 0.7–1.3)
Gamma Glob SerPl Elph-Mcnc: 0.8 g/dL (ref 0.4–1.8)
Globulin, Total: 2.5 g/dL (ref 2.2–3.9)
IgA: 51 mg/dL — ABNORMAL LOW (ref 61–437)
IgG (Immunoglobin G), Serum: 1012 mg/dL (ref 603–1613)
IgM (Immunoglobulin M), Srm: 27 mg/dL (ref 15–143)
Total Protein ELP: 6.3 g/dL (ref 6.0–8.5)

## 2019-03-21 LAB — KAPPA/LAMBDA LIGHT CHAINS
Kappa free light chain: 40.1 mg/L — ABNORMAL HIGH (ref 3.3–19.4)
Kappa, lambda light chain ratio: 3.37 — ABNORMAL HIGH (ref 0.26–1.65)
Lambda free light chains: 11.9 mg/L (ref 5.7–26.3)

## 2019-03-21 LAB — NOVEL CORONAVIRUS, NAA: SARS-CoV-2, NAA: NOT DETECTED

## 2019-03-21 NOTE — Telephone Encounter (Signed)
Scheduled appt per 5/29 los.  A calendar will be mailed out.

## 2019-03-28 ENCOUNTER — Telehealth: Payer: Self-pay

## 2019-03-28 ENCOUNTER — Telehealth: Payer: Self-pay | Admitting: Orthopaedic Surgery

## 2019-03-28 NOTE — Telephone Encounter (Signed)
Patient called needing test results from Covid test. Can't return to work without the results.  Please call patient asap.[phone]

## 2019-03-28 NOTE — Telephone Encounter (Signed)
I called and gave [facility] number to patient's wife. Patient's grandson has not been able to resume work because he has been in contact with his grandfather and they do not have results. Patient's wife will call me back if there are any issues receiving results.

## 2019-03-28 NOTE — Telephone Encounter (Signed)
Patient's wife [name], on [place], called to receive the covid test results, advised as noted in chart not detected, she verbalized understanding and ask for a copy to be mailed to the home, address correct and verified.

## 2019-03-28 NOTE — Telephone Encounter (Signed)
COVID 19 test result prepared to be mailed out to patient.

## 2019-03-30 ENCOUNTER — Other Ambulatory Visit: Payer: Self-pay | Admitting: Hematology

## 2019-03-30 DIAGNOSIS — C9000 Multiple myeloma not having achieved remission: Secondary | ICD-10-CM

## 2019-04-07 ENCOUNTER — Other Ambulatory Visit: Payer: Self-pay | Admitting: Hematology

## 2019-04-07 DIAGNOSIS — C9000 Multiple myeloma not having achieved remission: Secondary | ICD-10-CM

## 2019-04-14 MED FILL — NINLARO 3 MG CAPSULE: 3 | 28 days supply | Qty: 3 | Fill #0

## 2019-04-27 ENCOUNTER — Other Ambulatory Visit: Payer: Self-pay | Admitting: Hematology

## 2019-04-27 DIAGNOSIS — C9000 Multiple myeloma not having achieved remission: Secondary | ICD-10-CM

## 2019-05-09 ENCOUNTER — Telehealth: Payer: Self-pay | Admitting: Hematology

## 2019-05-09 NOTE — Telephone Encounter (Signed)
[facility] 7/30 appointments moved from 7/30 to 8/7 per [place]. Left message. Schedule mailed.

## 2019-05-12 MED FILL — NINLARO 3 MG CAPSULE: 3 | 28 days supply | Qty: 3 | Fill #1

## 2019-05-19 ENCOUNTER — Ambulatory Visit: Payer: Medicare HMO | Admitting: Hematology

## 2019-05-19 ENCOUNTER — Other Ambulatory Visit: Payer: Medicare HMO

## 2019-05-19 ENCOUNTER — Ambulatory Visit: Payer: Medicare HMO

## 2019-05-20 ENCOUNTER — Telehealth: Payer: Self-pay | Admitting: Hematology

## 2019-05-20 NOTE — Telephone Encounter (Signed)
[facility] 8/7 all appointments moved to 8/5. Left message. Schedule mailed.

## 2019-05-23 NOTE — Progress Notes (Signed)
[name]    HEMATOLOGY/ONCOLOGY CLINIC NOTE  Date of Service: 05/25/19    Patient Care Team: Sandi Mariscal, MD as PCP - General (Internal Medicine)  CHIEF COMPLAINTS/PURPOSE OF CONSULTATION:   F/u for continued management of Myeloma   HISTORY OF PRESENTING ILLNESS:  plz see previous note for details on HPI  INTERVAL HISTORY   Richard Vang is here for his scheduled followup for multiple myeloma. The patient's last visit with [name] was on 03/18/2019. The pt reports that he is doing well overall.  The pt reports fatigue, dizziness, and worsening back pain. He is eating well. He also reports feeling that "his shoes are too tight when he is not wearing shoes." He had diarrhea last week about 3-4x daily, which has mostly gone away. Denies blood and mucus in the stool. He has some belly pain. Denies dysuria. He has had no problems with Ninlaro and is eating well.   Lab results today (05/25/2019) of CBC w/diff and CMP is as follows: all values are WNL except for HGB at 12.3, PLT at 104k, lymphs abs at 0.6, glucose bld at 114, BUN at 34, Creatinine at 1.53, GFR at 45. 05/25/2019 MMP is pending 05/25/2019 K/L light chains is pending  On review of systems, pt reports eating well, belly pain, back pain, fatigue, dizziness, diarrhea last week, and denies dysuria, blood or mucus in the stool, and any other symptoms.   MEDICAL HISTORY:  Past Medical History:  Diagnosis Date  . Arthritis    "left shoulder" (07/31/2016)  . Coronary artery disease   . GERD (gastroesophageal reflux disease)   . Heart murmur   . High cholesterol   . Hypertension   . Multiple myeloma ([place])   . Sleep apnea    "probably; having test in November" (07/31/2016)  . Type II diabetes mellitus ([place])     SURGICAL HISTORY: Past Surgical History:  Procedure Laterality Date  . CARDIAC CATHETERIZATION N/A 07/31/2016   Procedure: Left Heart Cath and Coronary Angiography;  Surgeon: Lorretta Harp, MD;  Location: [facility];   Service: Cardiovascular;  Laterality: N/A;  . CARDIAC CATHETERIZATION N/A 07/31/2016   Procedure: Coronary Stent Intervention;  Surgeon: Lorretta Harp, MD;  Location: [facility];  Service: Cardiovascular;  Laterality: N/A;  . CORONARY ANGIOPLASTY    . OPEN REDUCTION INTERNAL FIXATION (ORIF) METACARPAL Left 05/21/2018   Procedure: OPEN REDUCTION INTERNAL FIXATION (ORIF) 2ND METACARPAL;  Surgeon: Shona Needles, MD;  Location: [facility];  Service: Orthopedics;  Laterality: Left;  . ORIF CLAVICULAR FRACTURE Right 05/21/2018   Procedure: OPEN REDUCTION INTERNAL FIXATION (ORIF) CLAVICULAR FRACTURE;  Surgeon: Shona Needles, MD;  Location: [facility];  Service: Orthopedics;  Laterality: Right;  . SHOULDER SURGERY Left 1973   "put pin in it where it had separated"   . TONSILLECTOMY  ~ 1956    SOCIAL HISTORY: Social History   Socioeconomic History  . Marital status: Divorced    Spouse name: Not on file  . Number of children: Not on file  . Years of education: Not on file  . Highest education level: Not on file  Occupational History  . Not on file  Social Needs  . Financial resource strain: Not on file  . Food insecurity    Worry: Not on file    Inability: Not on file  . Transportation needs    Medical: Not on file    Non-medical: Not on file  Tobacco Use  . Smoking status: Never  Smoker  . Smokeless tobacco: Never Used  Substance and Sexual Activity  . Alcohol use: Yes    Comment: 07/31/2016 "nothing since 2002"  . Drug use: No  . Sexual activity: Not Currently  Lifestyle  . Physical activity    Days per week: Not on file    Minutes per session: Not on file  . Stress: Not on file  Relationships  . Social Herbalist on phone: Not on file    Gets together: Not on file    Attends religious service: Not on file    Active member of club or organization: Not on file    Attends meetings of clubs or organizations: Not on file    Relationship status: Not on file  .  Intimate partner violence    Fear of current or ex partner: Not on file    Emotionally abused: Not on file    Physically abused: Not on file    Forced sexual activity: Not on file  Other Topics Concern  . Not on file  Social History Narrative  . Not on file    FAMILY HISTORY: Family History  Problem Relation Age of Onset  . Hypertension Other     ALLERGIES:  has No Known Allergies.  MEDICATIONS:  Current Outpatient Medications  Medication Sig Dispense Refill  . acyclovir (ZOVIRAX) 400 MG tablet Take 1 tablet by mouth twice daily 60 tablet 0  . amLODipine (NORVASC) 5 MG tablet Take 5 mg by mouth daily.    [name] atorvastatin (LIPITOR) 40 MG tablet Take 1 tablet (40 mg total) by mouth at bedtime. 30 tablet 12  . carvedilol (COREG) 12.5 MG tablet Take 12.5 mg by mouth 2 (two) times daily with a meal.    . clopidogrel (PLAVIX) 75 MG tablet Take 1 tablet (75 mg total) by mouth daily with breakfast. 30 tablet 12  . Cyanocobalamin (VITAMIN B-12) 2500 MCG SUBL Place 1 tablet under the tongue daily with breakfast.    . cyclobenzaprine (FLEXERIL) 10 MG tablet Take 10 mg by mouth daily as needed for pain.  2  . erythromycin ophthalmic ointment     . furosemide (LASIX) 40 MG tablet Take 40 mg by mouth daily with breakfast.     . insulin aspart (NOVOLOG) 100 UNIT/ML injection Inject 0-9 Units into the skin every 4 (four) hours. 10 mL 11  . montelukast (SINGULAIR) 10 MG tablet Take 10 mg by mouth daily.    [name] NINLARO 3 MG capsule TAKE 1 CAPSULE (3 MG TOTAL) BY MOUTH ONCE A WEEK. ON DAY 1,8,15 EVERY 28 DAYS. TAKE ON AN EMPTY STOMACH 1HR BEFORE OR 2HRS AFTER FOOD. 3 capsule 1  . nitroGLYCERIN (NITROSTAT) 0.3 MG SL tablet Place 0.3 mg under the tongue every 5 (five) minutes as needed for chest pain.     [name] omeprazole (PRILOSEC) 40 MG capsule Take 40 mg by mouth daily with breakfast.     . ondansetron (ZOFRAN) 8 MG tablet TAKE 1 TABLET BY MOUTH TWICE DAILY AS NEEDED FOR  REFRACTORY NAUSEA/VOMITING.  STARTING ON DAYS  4 AND 11 NOT ON DAY 8 30 tablet 1  . oxyCODONE-acetaminophen (PERCOCET) 10-325 MG tablet Take 1 tablet by mouth 2 (two) times daily. 10 tablet 0  . potassium chloride SA (K-DUR,KLOR-CON) 20 MEQ tablet Take 1 tablet (20 mEq total) by mouth daily. (Patient taking differently: Take 20 mEq by mouth daily with breakfast. )    . prochlorperazine (COMPAZINE) 10 MG tablet Take 1 tablet (  10 mg total) by mouth every 6 (six) hours as needed (Nausea or vomiting). (Patient taking differently: Take 10 mg by mouth every 6 (six) hours as needed for nausea or vomiting. ) 30 tablet 1  . tamsulosin (FLOMAX) 0.4 MG CAPS capsule Take 1 capsule (0.4 mg total) by mouth daily after supper. 30 capsule 0  . Vitamin D, Ergocalciferol, (DRISDOL) 1.25 MG (50000 UT) CAPS capsule Take 1 capsule by mouth once a week 12 capsule 0  . warfarin (COUMADIN) 4 MG tablet Take 1.5 tablets (6 mg total) by mouth daily. 30 tablet 0   Current Facility-Administered Medications  Medication Dose Route Frequency Provider Last Rate Last Dose  . 0.9 %  sodium chloride infusion   Intravenous Continuous Brunetta Genera, MD       Facility-Administered Medications Ordered in Other Visits  Medication Dose Route Frequency Provider Last Rate Last Dose  . 0.9 %  sodium chloride infusion   Intravenous Continuous Brunetta Genera, MD 10 mL/hr at 07/09/17 1423      REVIEW OF SYSTEMS:    A 10+ POINT REVIEW OF SYSTEMS WAS OBTAINED including neurology, dermatology, psychiatry, cardiac, respiratory, lymph, extremities, GI, GU, Musculoskeletal, constitutional, breasts, reproductive, HEENT.  All pertinent positives are noted in the HPI.  All others are negative.   PHYSICAL EXAMINATION:  ECOG PERFORMANCE STATUS: 2  GENERAL:alert, in no acute distress and comfortable SKIN: no acute rashes, no significant lesions EYES: conjunctiva are pink and non-injected, sclera anicteric OROPHARYNX: MMM, no exudates, no oropharyngeal erythema or  ulceration NECK: supple, no JVD LYMPH:  no palpable lymphadenopathy in the cervical, axillary or inguinal regions LUNGS: clear to auscultation b/l with normal respiratory effort HEART: regular rate & rhythm ABDOMEN:  normoactive bowel sounds , non tender, not distended. No palpable hepatosplenomegaly.  Extremity: no pedal edema PSYCH: alert & oriented x 3 with fluent speech NEURO: no focal motor/sensory deficits   LABORATORY DATA:  I have reviewed the data as listed . CBC Latest Ref Rng & Units 05/30/2019 05/25/2019 03/18/2019  WBC 4.0 - 10.5 K/uL 5.6 5.5 6.3  Hemoglobin 13.0 - 17.0 g/dL 11.6(L) 12.3(L) 12.5(L)  Hematocrit 39.0 - 52.0 % 39.1 39.7 40.4  Platelets 150 - 400 K/uL 80(L) 104(L) 120(L)   . CBC    Component Value Date/Time   WBC 5.6 05/30/2019 1253   RBC 4.09 (L) 05/30/2019 1253   HGB 11.6 (L) 05/30/2019 1253   HGB 10.5 (L) 02/25/2018 1219   HGB 9.2 (L) 10/01/2017 1112   HCT 39.1 05/30/2019 1253   HCT 29.3 (L) 10/01/2017 1112   PLT 80 (L) 05/30/2019 1253   PLT 132 (L) 02/25/2018 1219   PLT 170 10/01/2017 1112   MCV 95.6 05/30/2019 1253   MCV 88.5 10/01/2017 1112   MCH 28.4 05/30/2019 1253   MCHC 29.7 (L) 05/30/2019 1253   RDW 15.8 (H) 05/30/2019 1253   RDW 15.3 (H) 10/01/2017 1112   LYMPHSABS 0.6 (L) 05/30/2019 1253   LYMPHSABS 0.5 (L) 10/01/2017 1112   MONOABS 0.6 05/30/2019 1253   MONOABS 0.5 10/01/2017 1112   EOSABS 0.2 05/30/2019 1253   EOSABS 0.1 10/01/2017 1112   BASOSABS 0.0 05/30/2019 1253   BASOSABS 0.0 10/01/2017 1112    . CMP Latest Ref Rng & Units 05/30/2019 05/25/2019 03/18/2019  Glucose 70 - 99 mg/dL 111(H) 114(H) 119(H)  BUN 8 - 23 mg/dL 34(H) 34(H) 46(H)  Creatinine 0.61 - 1.24 mg/dL 1.46(H) 1.53(H) 1.73(H)  Sodium 135 - 145 mmol/L 141 143 142  Potassium 3.5 - 5.1 mmol/L 4.3 4.5 4.6  Chloride 98 - 111 mmol/L 107 108 110  CO2 22 - 32 mmol/L _0 Calcium 8.9 - 10.3 mg/dL 8.8(L) 9.3 8.9  Total Protein 6.5 - 8.1 g/dL 7.2 6.9 6.8  Total  Bilirubin 0.3 - 1.2 mg/dL 1.2 1.1 0.8  Alkaline Phos 38 - 126 U/L 78 85 88  AST 15 - 41 U/L 35 37 24  ALT 0 - 44 U/L _1 03/18/2019 MMP: Component     Latest Ref Rng & Units 03/18/2019  IgG (Immunoglobin G), Serum     603 - 1,613 mg/dL 1,012  IgA     61 - 437 mg/dL 51 (L)  IgM (Immunoglobulin M), Srm     15 - 143 mg/dL 27  Total Protein ELP     6.0 - 8.5 g/dL 6.3  Albumin SerPl Elph-Mcnc     2.9 - 4.4 g/dL 3.8  Alpha 1     0.0 - 0.4 g/dL 0.2  Alpha2 Glob SerPl Elph-Mcnc     0.4 - 1.0 g/dL 0.7  B-Globulin SerPl Elph-Mcnc     0.7 - 1.3 g/dL 0.8  Gamma Glob SerPl Elph-Mcnc     0.4 - 1.8 g/dL 0.8  M Protein SerPl Elph-Mcnc     Not Observed g/dL Not Observed  Globulin, Total     2.2 - 3.9 g/dL 2.5  Albumin/Glob SerPl     0.7 - 1.7 1.6  IFE 1      Comment  Please Note (HCV):      Comment    RADIOGRAPHIC STUDIES: I have personally reviewed the radiological images as listed and agreed with the findings in the report. No results found.  ASSESSMENT & PLAN:   73 y.o. 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  K/L 95, No M spike on SPEP -- suggests light chain MM Cytogenetics - normal male chromosomes FISH- +11 and 13q-/-13  Patient is s/p 1 cycle of Vd and Serum free kappa LC has decreased from 700 to 203.6 with improvement in his K/L ratio from 94.59 to 29. Completed 2nd cycle of treatment with Vd + Cytoxan (231m/m2) Completed 3rd cycle of  treatment with Vd + Cytoxan (2060mm2) - cytoxan held D15 due to thrombocytopenia Completed 4th of VCd Discontinued VCd after C5D8 due to intolerance with diarrhea and increasing neuropathy and fatigue with borderline functional status. -Patient did not tolerate maintenance Revlimid 10 mg by mouth daily due to grade 2-3 diarrhea. This was discontinued and his diarrhea has resolved.  Currently on maintenance Ninlaro M spike absent   11/15/18 CT Pelvic revealed  3 mm stone in the right UVJ. 5 x 6 mm stone is identified in the left UVJ. No associated distal hydroureter. 2. Stable lucent lesion in the right iliac bone. 3. Small right groin hernia contains only fat. 11/15/18 CT Lumbar revealed  No acute fracture or malalignment. 2. Severe osteopenia and multiple old compression fractures, severe at T11 and T12. 3. Severe canal stenosis L3-4. Multilevel neural foraminal narrowing:  Moderate to severe at T12-L1. 4. Slowly enlarging solid LEFT renal mass. Recommend non emergent MRI renal mass protocol on non emergent basis -No obvious new myeloma involvement seen in most recent CT imaging.  PLAN:  -Discussed pt labwork today, 05/25/2019 -The pt has no prohibitive toxicities from continuing 16m Ninlaro on D1, D8, and D15 at this time. 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 -Continue Vitamin D replacement weekly -Continue Aredia every 8 weeks -Continue B12 replacement -Continue acyclovir for shingles prophylaxis. -Continue aspirin -F/U with orthopedic doctor for back pain as scheduled -Recommended that the pt continue to eat well, drink at least 48-64 oz of water each day, and walk 20-30 minutes each day.  -Will order 0.5L IV fluids today -Will order stool testing to rule out infection  -Will see the pt back in 2 months          2) CAD s/p prox LAD DES PCI on 07/31/2016 with ischemic cardiomyopathy ejection fraction of 25-35%. Rpt ECHO on 10/03/2016 shows improvement in EF to 55-60% with  no RWMABN.  3) Macrocytic Anemia with moderate thrombocytopenia - due to MM and and Ninlaro. Stable.  4) Thrombocytopenia - due to MM and treatment --improved today 108k. --will monitor  5) B12 deficiency - Plan -cont SL B12 10034m po daily  6) h/o Afib with RVR on coumadin Some bradycardia with BB -continue mx per PCP and cardiology  7) Back pain -- multiple chronic compression fractures with some worsening -On bisphosphonates already -Ergocalciferol 50k units weekly -Advised that the pt not lift anything heavy  -Discussed the 11/15/18 CT imaging, as noted above, did refer pt to orthopedist -Pt will be following up with orthopedist in December 2020   -Plz schedule next dose of Pamidronate, labs and MD visit in 2 months.   All of the patients questions were answered with apparent satisfaction. The patient knows to call the clinic with any problems, questions or concerns.  The total time spent in the appt was 20 minutes and more than 50% was on counseling and direct patient cares.  GaSullivan LoneD MSWestphaliaAHIVMS SCWills Eye Surgery Center At Plymoth MeetingTCarroll County Memorial Hospitalematology/Oncology Physician CoSelect Long Term Care Hospital-Color[facility](Office):       [phone] cell):  [phone]):           [phone], EmDe Burrsam acting as a scribe for Dr. KaIrene Limbo.I have reviewed the above documentation for accuracy and completeness, and I agree with the above. .GBrunetta GeneraD

## 2019-05-25 ENCOUNTER — Telehealth: Payer: Self-pay | Admitting: Hematology

## 2019-05-25 ENCOUNTER — Inpatient Hospital Stay: Payer: Medicare HMO

## 2019-05-25 ENCOUNTER — Inpatient Hospital Stay: Payer: Medicare HMO | Attending: Hematology

## 2019-05-25 ENCOUNTER — Other Ambulatory Visit: Payer: Self-pay

## 2019-05-25 ENCOUNTER — Inpatient Hospital Stay (HOSPITAL_BASED_OUTPATIENT_CLINIC_OR_DEPARTMENT_OTHER): Payer: Medicare HMO | Admitting: Hematology

## 2019-05-25 VITALS — BP 131/84 | HR 67 | Temp 98.5°F | Resp 18 | Ht 70.0 in | Wt 232.3 lb

## 2019-05-25 VITALS — BP 132/82 | HR 64 | Temp 98.1°F | Resp 18

## 2019-05-25 DIAGNOSIS — M549 Dorsalgia, unspecified: Secondary | ICD-10-CM | POA: Diagnosis not present

## 2019-05-25 DIAGNOSIS — E7800 Pure hypercholesterolemia, unspecified: Secondary | ICD-10-CM | POA: Diagnosis not present

## 2019-05-25 DIAGNOSIS — I2510 Atherosclerotic heart disease of native coronary artery without angina pectoris: Secondary | ICD-10-CM | POA: Diagnosis not present

## 2019-05-25 DIAGNOSIS — Z7189 Other specified counseling: Secondary | ICD-10-CM

## 2019-05-25 DIAGNOSIS — I10 Essential (primary) hypertension: Secondary | ICD-10-CM | POA: Insufficient documentation

## 2019-05-25 DIAGNOSIS — Z7901 Long term (current) use of anticoagulants: Secondary | ICD-10-CM | POA: Insufficient documentation

## 2019-05-25 DIAGNOSIS — E860 Dehydration: Secondary | ICD-10-CM | POA: Insufficient documentation

## 2019-05-25 DIAGNOSIS — R197 Diarrhea, unspecified: Secondary | ICD-10-CM | POA: Diagnosis not present

## 2019-05-25 DIAGNOSIS — E538 Deficiency of other specified B group vitamins: Secondary | ICD-10-CM | POA: Insufficient documentation

## 2019-05-25 DIAGNOSIS — R42 Dizziness and giddiness: Secondary | ICD-10-CM | POA: Diagnosis not present

## 2019-05-25 DIAGNOSIS — R109 Unspecified abdominal pain: Secondary | ICD-10-CM | POA: Insufficient documentation

## 2019-05-25 DIAGNOSIS — K219 Gastro-esophageal reflux disease without esophagitis: Secondary | ICD-10-CM | POA: Insufficient documentation

## 2019-05-25 DIAGNOSIS — M1990 Unspecified osteoarthritis, unspecified site: Secondary | ICD-10-CM | POA: Insufficient documentation

## 2019-05-25 DIAGNOSIS — Z794 Long term (current) use of insulin: Secondary | ICD-10-CM | POA: Diagnosis not present

## 2019-05-25 DIAGNOSIS — G629 Polyneuropathy, unspecified: Secondary | ICD-10-CM | POA: Diagnosis not present

## 2019-05-25 DIAGNOSIS — C9000 Multiple myeloma not having achieved remission: Secondary | ICD-10-CM

## 2019-05-25 DIAGNOSIS — D6959 Other secondary thrombocytopenia: Secondary | ICD-10-CM | POA: Diagnosis not present

## 2019-05-25 DIAGNOSIS — I4891 Unspecified atrial fibrillation: Secondary | ICD-10-CM | POA: Insufficient documentation

## 2019-05-25 DIAGNOSIS — Z7902 Long term (current) use of antithrombotics/antiplatelets: Secondary | ICD-10-CM | POA: Insufficient documentation

## 2019-05-25 DIAGNOSIS — D539 Nutritional anemia, unspecified: Secondary | ICD-10-CM | POA: Insufficient documentation

## 2019-05-25 DIAGNOSIS — Z79899 Other long term (current) drug therapy: Secondary | ICD-10-CM | POA: Insufficient documentation

## 2019-05-25 LAB — CMP (CANCER CENTER ONLY)
ALT: 27 U/L (ref 0–44)
AST: 37 U/L (ref 15–41)
Albumin: 4.1 g/dL (ref 3.5–5.0)
Alkaline Phosphatase: 85 U/L (ref 38–126)
Anion gap: 10 (ref 5–15)
BUN: 34 mg/dL — ABNORMAL HIGH (ref 8–23)
CO2: 25 mmol/L (ref 22–32)
Calcium: 9.3 mg/dL (ref 8.9–10.3)
Chloride: 108 mmol/L (ref 98–111)
Creatinine: 1.53 mg/dL — ABNORMAL HIGH (ref 0.61–1.24)
GFR, Est AFR Am: 52 mL/min — ABNORMAL LOW (ref >60)
GFR, Estimated: 45 mL/min — ABNORMAL LOW (ref >60)
Glucose, Bld: 114 mg/dL — ABNORMAL HIGH (ref 70–99)
Potassium: 4.5 mmol/L (ref 3.5–5.1)
Sodium: 143 mmol/L (ref 135–145)
Total Bilirubin: 1.1 mg/dL (ref 0.3–1.2)
Total Protein: 6.9 g/dL (ref 6.5–8.1)

## 2019-05-25 LAB — CBC WITH DIFFERENTIAL/PLATELET
Abs Immature Granulocytes: 0.03 10*3/uL (ref 0.00–0.07)
Basophils Absolute: 0 10*3/uL (ref 0.0–0.1)
Basophils Relative: 0 %
Eosinophils Absolute: 0.1 10*3/uL (ref 0.0–0.5)
Eosinophils Relative: 2 %
HCT: 39.7 % (ref 39.0–52.0)
Hemoglobin: 12.3 g/dL — ABNORMAL LOW (ref 13.0–17.0)
Immature Granulocytes: 1 %
Lymphocytes Relative: 11 %
Lymphs Abs: 0.6 10*3/uL — ABNORMAL LOW (ref 0.7–4.0)
MCH: 28.9 pg (ref 26.0–34.0)
MCHC: 31 g/dL (ref 30.0–36.0)
MCV: 93.2 fL (ref 80.0–100.0)
Monocytes Absolute: 0.6 10*3/uL (ref 0.1–1.0)
Monocytes Relative: 11 %
Neutro Abs: 4.1 10*3/uL (ref 1.7–7.7)
Neutrophils Relative %: 75 %
Platelets: 104 10*3/uL — ABNORMAL LOW (ref 150–400)
RBC: 4.26 MIL/uL (ref 4.22–5.81)
RDW: 15.2 % (ref 11.5–15.5)
WBC: 5.5 10*3/uL (ref 4.0–10.5)
nRBC: 0 % (ref 0.0–0.2)

## 2019-05-25 MED ORDER — SODIUM CHLORIDE 0.9 % IV SOLN
INTRAVENOUS | Status: DC
  Administered 2019-05-25: 12:00:00 via INTRAVENOUS
  Filled 2019-05-25: qty 250

## 2019-05-25 MED ORDER — SODIUM CHLORIDE 0.9 % IV SOLN
INTRAVENOUS | Status: AC
  Administered 2019-05-25: 13:00:00 via INTRAVENOUS
  Filled 2019-05-25 (×2): qty 250

## 2019-05-25 MED ORDER — SODIUM CHLORIDE 0.9 % IV SOLN
60.0000 mg | Freq: Once | INTRAVENOUS | Status: AC
  Administered 2019-05-25: 13:00:00 60 mg via INTRAVENOUS
  Filled 2019-05-25: qty 20

## 2019-05-25 NOTE — Progress Notes (Signed)
Pamidronate 60mg /74mL vial used x 1, [facility]:[id number] AC, Exp:01/18/2020  NS 530mL, [facility]:[id number] Exp: Aug 2021

## 2019-05-25 NOTE — Telephone Encounter (Signed)
Scheduled appt per 8/5 los. °

## 2019-05-25 NOTE — Patient Instructions (Signed)
Pamidronate injection What is this medicine? PAMIDRONATE (pa mi DROE nate) slows calcium loss from bones. It is used to treat high calcium blood levels from cancer or Paget's disease. It is also used to treat bone pain and prevent fractures from certain cancers that have spread to the bone. This medicine may be used for other purposes; ask your health care provider or pharmacist if you have questions. COMMON BRAND NAME(S): Aredia What should I tell my health care provider before I take this medicine? They need to know if you have any of these conditions:  aspirin-sensitive asthma  dental disease  kidney disease  an unusual or allergic reaction to pamidronate, other medicines, foods, dyes, or preservatives  pregnant or trying to get pregnant  breast-feeding How should I use this medicine? This medicine is for infusion into a vein. It is given by a health care professional in a hospital or clinic setting. Talk to your pediatrician regarding the use of this medicine in children. This medicine is not approved for use in children. Overdosage: If you think you have taken too much of this medicine contact a poison control center or emergency room at once. NOTE: This medicine is only for you. Do not share this medicine with others. What if I miss a dose? This does not apply. What may interact with this medicine?  certain antibiotics given by injection  medicines for inflammation or pain like ibuprofen, naproxen  some diuretics like bumetanide, furosemide  cyclosporine  parathyroid hormone  tacrolimus  teriparatide  thalidomide This list may not describe all possible interactions. Give your health care provider a list of all the medicines, herbs, non-prescription drugs, or dietary supplements you use. Also tell them if you smoke, drink alcohol, or use illegal drugs. Some items may interact with your medicine. What should I watch for while using this medicine? Visit your doctor or  health care professional for regular checkups. It may be some time before you see the benefit from this medicine. Do not stop taking your medicine unless your doctor tells you to. Your doctor may order blood tests or other tests to see how you are doing. Women should inform their doctor if they wish to become pregnant or think they might be pregnant. There is a potential for serious side effects to an unborn child. Talk to your health care professional or pharmacist for more information. You should make sure that you get enough calcium and vitamin D while you are taking this medicine. Discuss the foods you eat and the vitamins you take with your health care professional. Some people who take this medicine have severe bone, joint, and/or muscle pain. This medicine may also increase your risk for a broken thigh bone. Tell your doctor right away if you have pain in your upper leg or groin. Tell your doctor if you have any pain that does not go away or that gets worse. What side effects may I notice from receiving this medicine? Side effects that you should report to your doctor or health care professional as soon as possible:  allergic reactions like skin rash, itching or hives, swelling of the face, lips, or tongue  black or tarry stools  changes in vision  eye inflammation, pain  high blood pressure  jaw pain, especially burning or cramping  muscle weakness  numb, tingling pain  swelling of feet or hands  trouble passing urine or change in the amount of urine  unable to move easily Side effects that usually do not  require medical attention (report to your doctor or health care professional if they continue or are bothersome):  bone, joint, or muscle pain  constipation  dizzy, drowsy  fever  headache  loss of appetite  nausea, vomiting  pain at site where injected This list may not describe all possible side effects. Call your doctor for medical advice about side effects.  You may report side effects to FDA at 1-800-FDA-1088. Where should I keep my medicine? This drug is given in a hospital or clinic and will not be stored at home. NOTE: This sheet is a summary. It may not cover all possible information. If you have questions about this medicine, talk to your doctor, pharmacist, or health care provider.  2020 Elsevier/Gold Standard (2011-04-04 08:49:49)   Rehydration, Adult Rehydration is the replacement of body fluids and salts and minerals (electrolytes) that are lost during dehydration. Dehydration is when there is not enough fluid or water in the body. This happens when you lose more fluids than you take in. Common causes of dehydration include:  Vomiting.  Diarrhea.  Excessive sweating, such as from heat exposure or exercise.  Taking medicines that cause the body to lose excess fluid (diuretics).  Impaired kidney function.  Not drinking enough fluid.  Certain illnesses or infections.  Certain poorly controlled long-term (chronic) illnesses, such as diabetes, heart disease, and kidney disease.  Symptoms of mild dehydration may include thirst, dry lips and mouth, dry skin, and dizziness. Symptoms of severe dehydration may include increased heart rate, confusion, fainting, and not urinating. You can rehydrate by drinking certain fluids or getting fluids through an IV tube, as told by your health care provider. What are the risks? Generally, rehydration is safe. However, one problem that can happen is taking in too much fluid (overhydration). This is rare. If overhydration happens, it can cause an electrolyte imbalance, kidney failure, or a decrease in salt (sodium) levels in the body. How to rehydrate Follow instructions from your health care provider for rehydration. The kind of fluid you should drink and the amount you should drink depend on your condition.  If directed by your health care provider, drink an oral rehydration solution (ORS). This  is a drink designed to treat dehydration that is found in pharmacies and retail stores. ? Make an ORS by following instructions on the package. ? Start by drinking small amounts, about  cup (120 mL) every 5-10 minutes. ? Slowly increase how much you drink until you have taken the amount recommended by your health care provider.  Drink enough clear fluids to keep your urine clear or pale yellow. If you were instructed to drink an ORS, finish the ORS first, then start slowly drinking other clear fluids. Drink fluids such as: ? Water. Do not drink only water. Doing that can lead to having too little sodium in your body (hyponatremia). ? Ice chips. ? Fruit juice that you have added water to (diluted juice). ? Low-calorie sports drinks.  If you are severely dehydrated, your health care provider may recommend that you receive fluids through an IV tube in the hospital.  Do not take sodium tablets. Doing that can lead to the condition of having too much sodium in your body (hypernatremia). Eating while you rehydrate Follow instructions from your health care provider about what to eat while you rehydrate. Your health care provider may recommend that you slowly begin eating regular foods in small amounts.  Eat foods that contain a healthy balance of electrolytes, such as bananas,  oranges, potatoes, tomatoes, and spinach.  Avoid foods that are greasy or contain a lot of fat or sugar.  In some cases, you may get nutrition through a feeding tube that is passed through your nose and into your stomach (nasogastric tube, or NG tube). This may be done if you have uncontrolled vomiting or diarrhea. Beverages to avoid Certain beverages may make dehydration worse. While you rehydrate, avoid:  Alcohol.  Caffeine.  Drinks that contain a lot of sugar. These include: ? High-calorie sports drinks. ? Fruit juice that is not diluted. ? Soda.  Check nutrition labels to see how much sugar or caffeine a  beverage contains. Signs of dehydration recovery You may be recovering from dehydration if:  You are urinating more often than before you started rehydrating.  Your urine is clear or pale yellow.  Your energy level improves.  You vomit less frequently.  You have diarrhea less frequently.  Your appetite improves or returns to normal.  You feel less dizzy or less light-headed.  Your skin tone and color start to look more normal. Contact a health care provider if:  You continue to have symptoms of mild dehydration, such as: ? Thirst. ? Dry lips. ? Slightly dry mouth. ? Dry, warm skin. ? Dizziness.  You continue to vomit or have diarrhea. Get help right away if:  You have symptoms of dehydration that get worse.  You feel: ? Confused. ? Weak. ? Like you are going to faint.  You have not urinated in 6-8 hours.  You have very dark urine.  You have trouble breathing.  Your heart rate while sitting still is over 100 beats a minute.  You cannot drink fluids without vomiting.  You have vomiting or diarrhea that: ? Gets worse. ? Does not go away.  You have a fever. This information is not intended to replace advice given to you by your health care provider. Make sure you discuss any questions you have with your health care provider. Document Released: 12/29/2011 Document Revised: 09/18/2017 Document Reviewed: 11/30/2015 Elsevier Patient Education  2020 [place] (COVID-19) Are you at risk?  Are you at risk for the Coronavirus (COVID-19)?  To be considered HIGH RISK for Coronavirus (COVID-19), you have to meet the following criteria:  . Traveled to [place], [place], [place], [place] or [place]; or in the [place] to [place], [place], [place], or [place]; and have fever, cough, and shortness of breath within the last 2 weeks of travel OR . Been in close contact with a person diagnosed with COVID-19 within the last 2 weeks and have fever,  cough, and shortness of breath . IF YOU DO NOT MEET THESE CRITERIA, YOU ARE CONSIDERED LOW RISK FOR COVID-19.  What to do if you are HIGH RISK for COVID-19?  [name] If you are having a medical emergency, call 911. . Seek medical care right away. Before you go to a doctor's office, urgent care or emergency department, call ahead and tell them about your recent travel, contact with someone diagnosed with COVID-19, and your symptoms. You should receive instructions from your physician's office regarding next steps of care.  . When you arrive at healthcare provider, tell the healthcare staff immediately you have returned from visiting [place], [place], [place], [place] or [place]; or traveled in the [place] to [place], [place], [place], or [place]; in the last two weeks or you have been in close contact with a person diagnosed with COVID-19 in  the last 2 weeks.   . Tell the health care staff about your symptoms: fever, cough and shortness of breath. . After you have been seen by a medical provider, you will be either: o Tested for (COVID-19) and discharged home on quarantine except to seek medical care if symptoms worsen, and asked to  - Stay home and avoid contact with others until you get your results (4-5 days)  - Avoid travel on public transportation if possible (such as bus, train, or airplane) or o Sent to the Emergency Department by EMS for evaluation, COVID-19 testing, and possible admission depending on your condition and test results.  What to do if you are LOW RISK for COVID-19?  Reduce your risk of any infection by using the same precautions used for avoiding the common cold or flu:  [name] Wash your hands often with soap and warm water for at least 20 seconds.  If soap and water are not readily available, use an alcohol-based hand sanitizer with at least 60% alcohol.  . If coughing or sneezing, cover your mouth and nose by coughing or sneezing into the elbow areas of your shirt or coat,  into a tissue or into your sleeve (not your hands). . Avoid shaking hands with others and consider head nods or verbal greetings only. . Avoid touching your eyes, nose, or mouth with unwashed hands.  . Avoid close contact with people who are sick. . Avoid places or events with large numbers of people in one location, like concerts or sporting events. . Carefully consider travel plans you have or are making. . If you are planning any travel outside or inside the [name], visit the CDC's Travelers' Health webpage for the latest health notices. . If you have some symptoms but not all symptoms, continue to monitor at home and seek medical attention if your symptoms worsen. . If you are having a medical emergency, call 911.   [place] / e-Visit: [email or web address]         [facility]: [facility]: [phone]                   [facility]: [phone]

## 2019-05-26 ENCOUNTER — Other Ambulatory Visit: Payer: Self-pay | Admitting: Hematology

## 2019-05-26 DIAGNOSIS — C9000 Multiple myeloma not having achieved remission: Secondary | ICD-10-CM

## 2019-05-26 LAB — KAPPA/LAMBDA LIGHT CHAINS
Kappa free light chain: 46.1 mg/L — ABNORMAL HIGH (ref 3.3–19.4)
Kappa, lambda light chain ratio: 3.34 — ABNORMAL HIGH (ref 0.26–1.65)
Lambda free light chains: 13.8 mg/L (ref 5.7–26.3)

## 2019-05-26 NOTE — Telephone Encounter (Signed)
Next scheduled F/U 07-25-2019.

## 2019-05-27 ENCOUNTER — Ambulatory Visit: Payer: Medicare HMO | Admitting: Hematology

## 2019-05-27 ENCOUNTER — Ambulatory Visit: Payer: Medicare HMO

## 2019-05-27 ENCOUNTER — Other Ambulatory Visit: Payer: Medicare HMO

## 2019-05-27 LAB — MULTIPLE MYELOMA PANEL, SERUM
Albumin SerPl Elph-Mcnc: 3.7 g/dL (ref 2.9–4.4)
Albumin/Glob SerPl: 1.5 (ref 0.7–1.7)
Alpha 1: 0.2 g/dL (ref 0.0–0.4)
Alpha2 Glob SerPl Elph-Mcnc: 0.6 g/dL (ref 0.4–1.0)
B-Globulin SerPl Elph-Mcnc: 0.9 g/dL (ref 0.7–1.3)
Gamma Glob SerPl Elph-Mcnc: 0.8 g/dL (ref 0.4–1.8)
Globulin, Total: 2.5 g/dL (ref 2.2–3.9)
IgA: 50 mg/dL — ABNORMAL LOW (ref 61–437)
IgG (Immunoglobin G), Serum: 996 mg/dL (ref 603–1613)
IgM (Immunoglobulin M), Srm: 27 mg/dL (ref 15–143)
Total Protein ELP: 6.2 g/dL (ref 6.0–8.5)

## 2019-05-30 ENCOUNTER — Emergency Department (HOSPITAL_COMMUNITY): Payer: Medicare HMO

## 2019-05-30 ENCOUNTER — Other Ambulatory Visit: Payer: Self-pay

## 2019-05-30 ENCOUNTER — Observation Stay (HOSPITAL_COMMUNITY)
Admission: EM | Admit: 2019-05-30 | Discharge: 2019-05-31 | Disposition: A | Discharge status: Home / Self Care | Payer: Medicare HMO | Attending: Internal Medicine | Admitting: Internal Medicine

## 2019-05-30 ENCOUNTER — Encounter (HOSPITAL_COMMUNITY): Payer: Self-pay | Admitting: Emergency Medicine

## 2019-05-30 DIAGNOSIS — N183 Chronic kidney disease, stage 3 unspecified: Secondary | ICD-10-CM | POA: Diagnosis present

## 2019-05-30 DIAGNOSIS — J45909 Unspecified asthma, uncomplicated: Secondary | ICD-10-CM | POA: Insufficient documentation

## 2019-05-30 DIAGNOSIS — Z955 Presence of coronary angioplasty implant and graft: Secondary | ICD-10-CM | POA: Insufficient documentation

## 2019-05-30 DIAGNOSIS — E1142 Type 2 diabetes mellitus with diabetic polyneuropathy: Secondary | ICD-10-CM | POA: Diagnosis not present

## 2019-05-30 DIAGNOSIS — R2681 Unsteadiness on feet: Secondary | ICD-10-CM | POA: Diagnosis not present

## 2019-05-30 DIAGNOSIS — E1122 Type 2 diabetes mellitus with diabetic chronic kidney disease: Secondary | ICD-10-CM | POA: Insufficient documentation

## 2019-05-30 DIAGNOSIS — Z7902 Long term (current) use of antithrombotics/antiplatelets: Secondary | ICD-10-CM | POA: Diagnosis not present

## 2019-05-30 DIAGNOSIS — R0609 Other forms of dyspnea: Secondary | ICD-10-CM | POA: Diagnosis present

## 2019-05-30 DIAGNOSIS — C9000 Multiple myeloma not having achieved remission: Secondary | ICD-10-CM | POA: Diagnosis present

## 2019-05-30 DIAGNOSIS — F329 Major depressive disorder, single episode, unspecified: Secondary | ICD-10-CM | POA: Diagnosis not present

## 2019-05-30 DIAGNOSIS — E785 Hyperlipidemia, unspecified: Secondary | ICD-10-CM | POA: Diagnosis not present

## 2019-05-30 DIAGNOSIS — R0902 Hypoxemia: Secondary | ICD-10-CM | POA: Insufficient documentation

## 2019-05-30 DIAGNOSIS — I5033 Acute on chronic diastolic (congestive) heart failure: Secondary | ICD-10-CM | POA: Insufficient documentation

## 2019-05-30 DIAGNOSIS — F419 Anxiety disorder, unspecified: Secondary | ICD-10-CM | POA: Insufficient documentation

## 2019-05-30 DIAGNOSIS — I2510 Atherosclerotic heart disease of native coronary artery without angina pectoris: Secondary | ICD-10-CM | POA: Diagnosis not present

## 2019-05-30 DIAGNOSIS — Z7951 Long term (current) use of inhaled steroids: Secondary | ICD-10-CM | POA: Insufficient documentation

## 2019-05-30 DIAGNOSIS — Z79899 Other long term (current) drug therapy: Secondary | ICD-10-CM | POA: Insufficient documentation

## 2019-05-30 DIAGNOSIS — Z20828 Contact with and (suspected) exposure to other viral communicable diseases: Secondary | ICD-10-CM | POA: Insufficient documentation

## 2019-05-30 DIAGNOSIS — I482 Chronic atrial fibrillation, unspecified: Secondary | ICD-10-CM | POA: Diagnosis not present

## 2019-05-30 DIAGNOSIS — I5032 Chronic diastolic (congestive) heart failure: Secondary | ICD-10-CM | POA: Diagnosis present

## 2019-05-30 DIAGNOSIS — Z7901 Long term (current) use of anticoagulants: Secondary | ICD-10-CM

## 2019-05-30 DIAGNOSIS — I130 Hypertensive heart and chronic kidney disease with heart failure and stage 1 through stage 4 chronic kidney disease, or unspecified chronic kidney disease: Secondary | ICD-10-CM | POA: Diagnosis not present

## 2019-05-30 DIAGNOSIS — E119 Type 2 diabetes mellitus without complications: Secondary | ICD-10-CM

## 2019-05-30 DIAGNOSIS — I509 Heart failure, unspecified: Secondary | ICD-10-CM

## 2019-05-30 DIAGNOSIS — Z9861 Coronary angioplasty status: Secondary | ICD-10-CM

## 2019-05-30 DIAGNOSIS — I10 Essential (primary) hypertension: Secondary | ICD-10-CM | POA: Diagnosis present

## 2019-05-30 LAB — COMPREHENSIVE METABOLIC PANEL
ALT: 26 U/L (ref 0–44)
AST: 35 U/L (ref 15–41)
Albumin: 4.2 g/dL (ref 3.5–5.0)
Alkaline Phosphatase: 78 U/L (ref 38–126)
Anion gap: 10 (ref 5–15)
BUN: 34 mg/dL — ABNORMAL HIGH (ref 8–23)
CO2: 24 mmol/L (ref 22–32)
Calcium: 8.8 mg/dL — ABNORMAL LOW (ref 8.9–10.3)
Chloride: 107 mmol/L (ref 98–111)
Creatinine, Ser: 1.46 mg/dL — ABNORMAL HIGH (ref 0.61–1.24)
GFR calc Af Amer: 55 mL/min — ABNORMAL LOW (ref >60)
GFR calc non Af Amer: 48 mL/min — ABNORMAL LOW (ref >60)
Glucose, Bld: 111 mg/dL — ABNORMAL HIGH (ref 70–99)
Potassium: 4.3 mmol/L (ref 3.5–5.1)
Sodium: 141 mmol/L (ref 135–145)
Total Bilirubin: 1.2 mg/dL (ref 0.3–1.2)
Total Protein: 7.2 g/dL (ref 6.5–8.1)

## 2019-05-30 LAB — CBC WITH DIFFERENTIAL/PLATELET
Abs Immature Granulocytes: 0.04 10*3/uL (ref 0.00–0.07)
Basophils Absolute: 0 10*3/uL (ref 0.0–0.1)
Basophils Relative: 1 %
Eosinophils Absolute: 0.2 10*3/uL (ref 0.0–0.5)
Eosinophils Relative: 3 %
HCT: 39.1 % (ref 39.0–52.0)
Hemoglobin: 11.6 g/dL — ABNORMAL LOW (ref 13.0–17.0)
Immature Granulocytes: 1 %
Lymphocytes Relative: 11 %
Lymphs Abs: 0.6 10*3/uL — ABNORMAL LOW (ref 0.7–4.0)
MCH: 28.4 pg (ref 26.0–34.0)
MCHC: 29.7 g/dL — ABNORMAL LOW (ref 30.0–36.0)
MCV: 95.6 fL (ref 80.0–100.0)
Monocytes Absolute: 0.6 10*3/uL (ref 0.1–1.0)
Monocytes Relative: 10 %
Neutro Abs: 4.2 10*3/uL (ref 1.7–7.7)
Neutrophils Relative %: 74 %
Platelets: 80 10*3/uL — ABNORMAL LOW (ref 150–400)
RBC: 4.09 MIL/uL — ABNORMAL LOW (ref 4.22–5.81)
RDW: 15.8 % — ABNORMAL HIGH (ref 11.5–15.5)
WBC: 5.6 10*3/uL (ref 4.0–10.5)
nRBC: 0 % (ref 0.0–0.2)

## 2019-05-30 LAB — TROPONIN I (HIGH SENSITIVITY)
Troponin I (High Sensitivity): 23 ng/L — ABNORMAL HIGH (ref <18)
Troponin I (High Sensitivity): 23 ng/L — ABNORMAL HIGH (ref <18)

## 2019-05-30 LAB — PROTIME-INR
INR: 2.4 — ABNORMAL HIGH (ref 0.8–1.2)
Prothrombin Time: 25.7 seconds — ABNORMAL HIGH (ref 11.4–15.2)

## 2019-05-30 LAB — SARS CORONAVIRUS 2 BY RT PCR (HOSPITAL ORDER, PERFORMED IN ~~LOC~~ HOSPITAL LAB): SARS Coronavirus 2: NEGATIVE

## 2019-05-30 LAB — BRAIN NATRIURETIC PEPTIDE: B Natriuretic Peptide: 526 pg/mL — ABNORMAL HIGH (ref 0.0–100.0)

## 2019-05-30 LAB — D-DIMER, QUANTITATIVE: D-Dimer, Quant: 0.35 ug/mL-FEU (ref 0.00–0.50)

## 2019-05-30 MED ORDER — MOMETASONE FURO-FORMOTEROL FUM 200-5 MCG/ACT IN AERO
2.0000 | INHALATION_SPRAY | Freq: Two times a day (BID) | RESPIRATORY_TRACT | Status: DC
  Administered 2019-05-30 – 2019-05-31 (×2): 2 via RESPIRATORY_TRACT
  Filled 2019-05-30: qty 8.8

## 2019-05-30 MED ORDER — CLOPIDOGREL BISULFATE 75 MG PO TABS
75.0000 mg | ORAL_TABLET | Freq: Every day | ORAL | Status: DC
  Administered 2019-05-31: 75 mg via ORAL
  Filled 2019-05-30: qty 1

## 2019-05-30 MED ORDER — POTASSIUM CHLORIDE CRYS ER 20 MEQ PO TBCR
20.0000 meq | EXTENDED_RELEASE_TABLET | Freq: Every day | ORAL | Status: DC
  Administered 2019-05-31: 20 meq via ORAL
  Filled 2019-05-30: qty 1

## 2019-05-30 MED ORDER — WARFARIN - PHYSICIAN DOSING INPATIENT
Freq: Every day | Status: DC
  Administered 2019-05-31: 11:00:00

## 2019-05-30 MED ORDER — CARVEDILOL 12.5 MG PO TABS
12.5000 mg | ORAL_TABLET | Freq: Two times a day (BID) | ORAL | Status: DC
  Administered 2019-05-31 (×2): 12.5 mg via ORAL
  Filled 2019-05-30 (×2): qty 1

## 2019-05-30 MED ORDER — ONDANSETRON HCL 4 MG/2ML IJ SOLN: 4.0000 mg | Freq: Four times a day (QID) | INTRAMUSCULAR | Status: DC | PRN

## 2019-05-30 MED ORDER — SODIUM CHLORIDE 0.9% FLUSH: 3.0000 mL | INTRAVENOUS | Status: DC | PRN

## 2019-05-30 MED ORDER — PANTOPRAZOLE SODIUM 40 MG PO TBEC
40.0000 mg | DELAYED_RELEASE_TABLET | Freq: Every day | ORAL | Status: DC
  Administered 2019-05-31: 40 mg via ORAL
  Filled 2019-05-30: qty 1

## 2019-05-30 MED ORDER — FUROSEMIDE 10 MG/ML IJ SOLN
60.0000 mg | Freq: Two times a day (BID) | INTRAMUSCULAR | Status: DC
  Administered 2019-05-30 – 2019-05-31 (×3): 60 mg via INTRAVENOUS
  Filled 2019-05-30 (×3): qty 6

## 2019-05-30 MED ORDER — CYCLOBENZAPRINE HCL 10 MG PO TABS: 10.0000 mg | ORAL_TABLET | Freq: Every day | ORAL | Status: DC | PRN

## 2019-05-30 MED ORDER — ONDANSETRON HCL 4 MG PO TABS: 8.0000 mg | ORAL_TABLET | Freq: Two times a day (BID) | ORAL | Status: DC | PRN

## 2019-05-30 MED ORDER — SODIUM CHLORIDE 0.9 % IV SOLN: 250.0000 mL | INTRAVENOUS | Status: DC | PRN

## 2019-05-30 MED ORDER — AMLODIPINE BESYLATE 5 MG PO TABS
5.0000 mg | ORAL_TABLET | Freq: Every day | ORAL | Status: DC
  Administered 2019-05-31: 5 mg via ORAL
  Filled 2019-05-30: qty 1

## 2019-05-30 MED ORDER — OXYCODONE-ACETAMINOPHEN 5-325 MG PO TABS
1.0000 | ORAL_TABLET | Freq: Two times a day (BID) | ORAL | Status: DC
  Administered 2019-05-30 – 2019-05-31 (×2): 1 via ORAL
  Filled 2019-05-30 (×2): qty 1

## 2019-05-30 MED ORDER — IXAZOMIB CITRATE 3 MG PO CAPS
3.0000 mg | ORAL_CAPSULE | Freq: Once | ORAL | Status: AC
  Administered 2019-05-31: 3 mg via ORAL

## 2019-05-30 MED ORDER — SODIUM CHLORIDE 0.9% FLUSH
3.0000 mL | Freq: Two times a day (BID) | INTRAVENOUS | Status: DC
  Administered 2019-05-30 – 2019-05-31 (×2): 3 mL via INTRAVENOUS

## 2019-05-30 MED ORDER — ACYCLOVIR 400 MG PO TABS
400.0000 mg | ORAL_TABLET | Freq: Two times a day (BID) | ORAL | Status: DC
  Administered 2019-05-30 – 2019-05-31 (×2): 400 mg via ORAL
  Filled 2019-05-30 (×2): qty 1

## 2019-05-30 MED ORDER — ATORVASTATIN CALCIUM 40 MG PO TABS
40.0000 mg | ORAL_TABLET | Freq: Every day | ORAL | Status: DC
  Administered 2019-05-30: 40 mg via ORAL
  Filled 2019-05-30: qty 1

## 2019-05-30 MED ORDER — OXYCODONE-ACETAMINOPHEN 10-325 MG PO TABS: 1.0000 | ORAL_TABLET | Freq: Two times a day (BID) | ORAL | Status: DC

## 2019-05-30 MED ORDER — ACETAMINOPHEN 325 MG PO TABS: 650.0000 mg | ORAL_TABLET | ORAL | Status: DC | PRN

## 2019-05-30 MED ORDER — OXYCODONE HCL 5 MG PO TABS
5.0000 mg | ORAL_TABLET | Freq: Two times a day (BID) | ORAL | Status: DC
  Administered 2019-05-30 – 2019-05-31 (×2): 5 mg via ORAL
  Filled 2019-05-30 (×2): qty 1

## 2019-05-30 MED ORDER — WARFARIN SODIUM 5 MG PO TABS
5.0000 mg | ORAL_TABLET | Freq: Every day | ORAL | Status: DC
  Administered 2019-05-31: 11:00:00 5 mg via ORAL
  Filled 2019-05-30: qty 1

## 2019-05-30 MED ORDER — LEVOFLOXACIN IN D5W 500 MG/100ML IV SOLN
500.0000 mg | Freq: Once | INTRAVENOUS | Status: AC
  Administered 2019-05-30: 15:00:00 500 mg via INTRAVENOUS
  Filled 2019-05-30: qty 100

## 2019-05-30 MED ORDER — MONTELUKAST SODIUM 10 MG PO TABS
10.0000 mg | ORAL_TABLET | Freq: Every day | ORAL | Status: DC
  Administered 2019-05-31: 10 mg via ORAL
  Filled 2019-05-30: qty 1

## 2019-05-30 MED ORDER — ESCITALOPRAM OXALATE 10 MG PO TABS
10.0000 mg | ORAL_TABLET | Freq: Every day | ORAL | Status: DC
  Administered 2019-05-31: 11:00:00 10 mg via ORAL
  Filled 2019-05-30: qty 1

## 2019-05-30 NOTE — ED Notes (Signed)
Report called to [place], [place]
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
## 2019-05-30 NOTE — Progress Notes (Signed)
Received pt from ED in stable condition, tele placed and verified, VS noted, O2 on 2l sat 98%. SOB noted moving from stretcher to bed. Orders reviewed, Lasix given per MD order. Urinals at bedside, bed alarm activated. SRP, RN

## 2019-05-30 NOTE — ED Notes (Addendum)
Pt ambulated while on Pulse Ox. SpO2 dropped from 98% 3L to 88% RA while ambulating. Steady & rapid gait with walker. Audible wheezing noted. Will continue to monitor.

## 2019-05-30 NOTE — ED Notes (Signed)
Pt is alert and oriented x 4 and is verbally responsive. Pt ambulated back to room with walker and was SOB with exertion, expiratory wheezes were noted, and some labored breathing. Pt is able to talk in full sentence and o2 saturations at 99-100, gave pt 2 lpm via [place] of O2 therapy for comfort.

## 2019-05-30 NOTE — ED Triage Notes (Signed)
Pt reports had SOB for couple weeks that has gotten worse over past couple days. Reports had xray today and was told by Oncologist to go to ED for fluid on lungs and enlarged heart. Pt not on O2 at home. Pt has copy of images on CD with him. Pt c/o back pains from cancer.
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
## 2019-05-30 NOTE — ED Notes (Signed)
ED TO INPATIENT HANDOFF REPORT  ED Nurse Name and Phone #: [phone]/ Clent [place] Name/Age/Gender Richard Vang 73 y.o. male Room/Bed: WA07/WA07  Code Status   Code Status: Full Code  Home/SNF/Other Home Patient oriented to: self, place, time and situation Is this baseline? Yes     Triage Complete: Triage complete  Chief Complaint fluid on lungs  Triage Note Pt reports had SOB for couple weeks that has gotten worse over past couple days. Reports had xray today and was told by Oncologist to go to ED for fluid on lungs and enlarged heart. Pt not on O2 at home. Pt has copy of images on CD with him. Pt c/o back pains from cancer.    Allergies No Known Allergies  Level of Care/Admitting Diagnosis ED Disposition    ED Disposition Condition Comment   Admit  Hospital Area: [facility] [[id number]]  Level of Care: Telemetry [5]  Admit to tele based on following criteria: Acute CHF  Covid Evaluation: Asymptomatic Screening Protocol (No Symptoms)  Diagnosis: Acute on chronic diastolic (congestive) heart failure [facility]) [[id number]]  Admitting Physician: [place], [place]  Attending Physician: Roney Jaffe [[id number]]  PT Class (Do Not Modify): Observation [104]  PT Acc Code (Do Not Modify): Observation [10022]       B Medical/Surgery History Past Medical History:  Diagnosis Date  . Arthritis    "left shoulder" (07/31/2016)  . Coronary artery disease   . GERD (gastroesophageal reflux disease)   . Heart murmur   . High cholesterol   . Hypertension   . Multiple myeloma ([place])   . Sleep apnea    "probably; having test in November" (07/31/2016)  . Type II diabetes mellitus ([place])    Past Surgical History:  Procedure Laterality Date  . CARDIAC CATHETERIZATION N/A 07/31/2016   Procedure: Left Heart Cath and Coronary Angiography;  Surgeon: Lorretta Harp, MD;  Location: [facility];  Service: Cardiovascular;  Laterality: N/A;  . CARDIAC  CATHETERIZATION N/A 07/31/2016   Procedure: Coronary Stent Intervention;  Surgeon: Lorretta Harp, MD;  Location: [facility];  Service: Cardiovascular;  Laterality: N/A;  . CORONARY ANGIOPLASTY    . OPEN REDUCTION INTERNAL FIXATION (ORIF) METACARPAL Left 05/21/2018   Procedure: OPEN REDUCTION INTERNAL FIXATION (ORIF) 2ND METACARPAL;  Surgeon: Shona Needles, MD;  Location: [facility];  Service: Orthopedics;  Laterality: Left;  . ORIF CLAVICULAR FRACTURE Right 05/21/2018   Procedure: OPEN REDUCTION INTERNAL FIXATION (ORIF) CLAVICULAR FRACTURE;  Surgeon: Shona Needles, MD;  Location: [facility];  Service: Orthopedics;  Laterality: Right;  . SHOULDER SURGERY Left 1973   "put pin in it where it had separated"   . TONSILLECTOMY  ~ 1956     A IV Location/Drains/Wounds Patient Lines/Drains/Airways Status   Active Line/Drains/Airways    Name:   Placement date:   Placement time:   Site:   Days:   Peripheral IV 05/30/19 Left Wrist   05/30/19    1240    Wrist   less than 1   Incision (Closed) 10/03/16 Hip Right   10/03/16    1043     969   Incision (Closed) 05/21/18 Shoulder Right   05/21/18    1617     374   Incision (Closed) 05/21/18 Hand Left   05/21/18    1650     374          Intake/Output Last 24 hours  Intake/Output Summary (Last 24 hours) at 05/30/2019  [place] filed at 05/30/2019 1743 Gross per 24 hour  Intake 100 ml  Output -  Net 100 ml    Labs/Imaging Results for orders placed or performed during the hospital encounter of 05/30/19 (from the past 48 hour(s))  CBC with Differential/Platelet     Status: Abnormal   Collection Time: 05/30/19 12:53 PM  Result Value Ref Range   WBC 5.6 4.0 - 10.5 K/uL   RBC 4.09 (L) 4.22 - 5.81 MIL/uL   Hemoglobin 11.6 (L) 13.0 - 17.0 g/dL   HCT 39.1 39.0 - 52.0 %   MCV 95.6 80.0 - 100.0 fL   MCH 28.4 26.0 - 34.0 pg   MCHC 29.7 (L) 30.0 - 36.0 g/dL   RDW 15.8 (H) 11.5 - 15.5 %   Platelets 80 (L) 150 - 400 K/uL    Comment: REPEATED TO  VERIFY PLATELET COUNT CONFIRMED BY SMEAR SPECIMEN CHECKED FOR CLOTS Immature Platelet Fraction may be clinically indicated, consider ordering this additional test [id number]    nRBC 0.0 0.0 - 0.2 %   Neutrophils Relative % 74 %   Neutro Abs 4.2 1.7 - 7.7 K/uL   Lymphocytes Relative 11 %   Lymphs Abs 0.6 (L) 0.7 - 4.0 K/uL   Monocytes Relative 10 %   Monocytes Absolute 0.6 0.1 - 1.0 K/uL   Eosinophils Relative 3 %   Eosinophils Absolute 0.2 0.0 - 0.5 K/uL   Basophils Relative 1 %   Basophils Absolute 0.0 0.0 - 0.1 K/uL   Immature Granulocytes 1 %   Abs Immature Granulocytes 0.04 0.00 - 0.07 K/uL    Comment: Performed at [facility], [place] [address], [place], [place] [address]  Comprehensive metabolic panel     Status: Abnormal   Collection Time: 05/30/19 12:53 PM  Result Value Ref Range   Sodium 141 135 - 145 mmol/L   Potassium 4.3 3.5 - 5.1 mmol/L   Chloride 107 98 - 111 mmol/L   CO2 24 22 - 32 mmol/L   Glucose, Bld 111 (H) 70 - 99 mg/dL   BUN 34 (H) 8 - 23 mg/dL   Creatinine, Ser 1.46 (H) 0.61 - 1.24 mg/dL   Calcium 8.8 (L) 8.9 - 10.3 mg/dL   Total Protein 7.2 6.5 - 8.1 g/dL   Albumin 4.2 3.5 - 5.0 g/dL   AST 35 15 - 41 U/L   ALT 26 0 - 44 U/L   Alkaline Phosphatase 78 38 - 126 U/L   Total Bilirubin 1.2 0.3 - 1.2 mg/dL   GFR calc non Af Amer 48 (L) >60 mL/min   GFR calc Af Amer 55 (L) >60 mL/min   Anion gap 10 5 - 15    Comment: Performed at [facility], [place] [address] [place], [place] [address]  Troponin I (High Sensitivity)     Status: Abnormal   Collection Time: 05/30/19 12:53 PM  Result Value Ref Range   Troponin I (High Sensitivity) 23 (H) <18 ng/L    Comment: (NOTE) Elevated high sensitivity troponin I (hsTnI) values and significant  changes across serial measurements may suggest ACS but many other  chronic and acute conditions are known to elevate hsTnI results.  Refer to the "Links" section for chest pain algorithms and  additional  guidance. Performed at [facility], [place] [address] [place], [place] [address]   Brain natriuretic peptide     Status: Abnormal   Collection Time: 05/30/19 12:54 PM  Result Value Ref Range  B Natriuretic Peptide 526.0 (H) 0.0 - 100.0 pg/mL    Comment: Performed at [facility], [place] [address], [place], [place] [address]  Troponin I (High Sensitivity)     Status: Abnormal   Collection Time: 05/30/19  2:35 PM  Result Value Ref Range   Troponin I (High Sensitivity) 23 (H) <18 ng/L    Comment: (NOTE) Elevated high sensitivity troponin I (hsTnI) values and significant  changes across serial measurements may suggest ACS but many other  chronic and acute conditions are known to elevate hsTnI results.  Refer to the "Links" section for chest pain algorithms and additional  guidance. Performed at [facility], [place] [address], [place], [place] [address]   D-dimer, quantitative (not at [facility])     Status: None   Collection Time: 05/30/19  2:35 PM  Result Value Ref Range   D-Dimer, Quant 0.35 0.00 - 0.50 ug/mL-FEU    Comment: (NOTE) At the manufacturer cut-off of 0.50 ug/mL FEU, this assay has been documented to exclude PE with a sensitivity and negative predictive value of 97 to 99%.  At this time, this assay has not been approved by the FDA to exclude DVT/VTE. Results should be correlated with clinical presentation. Performed at [facility], [place] [address], [place], [place] [address]   Protime-INR     Status: Abnormal   Collection Time: 05/30/19  2:35 PM  Result Value Ref Range   Prothrombin Time 25.7 (H) 11.4 - 15.2 seconds   INR 2.4 (H) 0.8 - 1.2    Comment: (NOTE) INR goal varies based on device and disease states. Performed at [facility], [place] [address], [place], [place] [address]    Dg Chest 2 View  Result Date: 05/30/2019 CLINICAL DATA:  Shortness of breath.   History of multiple myeloma EXAM: CHEST - 2 VIEW COMPARISON:  May 24, 2018 FINDINGS: There is subtle ill-defined opacity in the right upper lobe which may represent earliest changes of pneumonia. Lungs elsewhere are clear. Heart is enlarged with mild pulmonary venous hypertension. No adenopathy evident. There are postoperative changes in the right clavicle and upper left humerus. Multiple prior rib fractures on the left noted. IMPRESSION: Suspect early pneumonia right upper lobe. Pulmonary vascular congestion.  No appreciable interstitial edema. No adenopathy. Multiple prior fractures of left ribs. Electronically Signed   By: Lowella Grip III M.D.   On: 05/30/2019 13:08    Pending Labs Unresulted Labs (From admission, onward)    Start     Ordered   05/31/19 5797  Basic metabolic panel  Daily,   R     05/30/19 1741   05/30/19 1739  SARS CORONAVIRUS 2 Nasal Swab Aptima Multi Swab  (Asymptomatic/Tier 2 Patients Labs)  Once,   STAT    Question Answer Comment  Is this test for diagnosis or screening Screening   Symptomatic for COVID-19 as defined by CDC No   Hospitalized for COVID-19 No   Admitted to ICU for COVID-19 No   Previously tested for COVID-19 Yes   Resident in a congregate (group) care setting No   Employed in healthcare setting No      05/30/19 1738          Vitals/Pain Today's Vitals   05/30/19 1646 05/30/19 1730 05/30/19 1745 05/30/19 1821  BP: (!) 147/102 134/73    Pulse: 91  64   Resp: '14 13 20   ' Temp:      TempSrc:  SpO2: 96%  96%   PainSc:    0-No pain    Isolation Precautions No active isolations  Medications Medications  sodium chloride flush (NS) 0.9 % injection 3 mL (has no administration in time range)  sodium chloride flush (NS) 0.9 % injection 3 mL (has no administration in time range)  0.9 %  sodium chloride infusion (has no administration in time range)  acetaminophen (TYLENOL) tablet 650 mg (has no administration in time range)   ondansetron (ZOFRAN) injection 4 mg (has no administration in time range)  ixazomib citrate (NINLARO) capsule 3 mg (has no administration in time range)  levofloxacin (LEVAQUIN) IVPB 500 mg (0 mg Intravenous Stopped 05/30/19 1743)    Mobility walks with device Moderate fall risk   Focused Assessments Pulmonary Assessment Handoff:  Lung sounds:   O2 Device: Nasal Cannula O2 Flow Rate (L/min): 1.5 L/min      R Recommendations: See Admitting Provider Note  Report given to:   Additional Notes:

## 2019-05-31 ENCOUNTER — Observation Stay (HOSPITAL_BASED_OUTPATIENT_CLINIC_OR_DEPARTMENT_OTHER): Payer: Medicare HMO

## 2019-05-31 DIAGNOSIS — I361 Nonrheumatic tricuspid (valve) insufficiency: Secondary | ICD-10-CM

## 2019-05-31 DIAGNOSIS — I5033 Acute on chronic diastolic (congestive) heart failure: Secondary | ICD-10-CM | POA: Diagnosis not present

## 2019-05-31 LAB — BASIC METABOLIC PANEL
Anion gap: 13 (ref 5–15)
BUN: 34 mg/dL — ABNORMAL HIGH (ref 8–23)
CO2: 25 mmol/L (ref 22–32)
Calcium: 9.1 mg/dL (ref 8.9–10.3)
Chloride: 102 mmol/L (ref 98–111)
Creatinine, Ser: 1.46 mg/dL — ABNORMAL HIGH (ref 0.61–1.24)
GFR calc Af Amer: 55 mL/min — ABNORMAL LOW (ref >60)
GFR calc non Af Amer: 48 mL/min — ABNORMAL LOW (ref >60)
Glucose, Bld: 118 mg/dL — ABNORMAL HIGH (ref 70–99)
Potassium: 3.6 mmol/L (ref 3.5–5.1)
Sodium: 140 mmol/L (ref 135–145)

## 2019-05-31 LAB — PROTIME-INR
INR: 2.3 — ABNORMAL HIGH (ref 0.8–1.2)
Prothrombin Time: 24.6 seconds — ABNORMAL HIGH (ref 11.4–15.2)

## 2019-05-31 LAB — ECHOCARDIOGRAM COMPLETE
Height: 70 in
Weight: 3601.6 oz

## 2019-05-31 LAB — GLUCOSE, CAPILLARY: Glucose-Capillary: 121 mg/dL — ABNORMAL HIGH (ref 70–99)

## 2019-05-31 MED ORDER — INSULIN ASPART 100 UNIT/ML ~~LOC~~ SOLN: 0.0000 [IU] | Freq: Three times a day (TID) | SUBCUTANEOUS | Status: DC

## 2019-05-31 MED ORDER — FUROSEMIDE 40 MG PO TABS: 40.0000 mg | ORAL_TABLET | Freq: Two times a day (BID) | ORAL | 0 refills | Status: DC

## 2019-05-31 MED ORDER — POTASSIUM CHLORIDE ER 8 MEQ PO CPCR: 1.0000 | ORAL_CAPSULE | Freq: Two times a day (BID) | ORAL | 0 refills | Status: DC

## 2019-05-31 NOTE — TOC Progression Note (Signed)
Transition of Care [facility]) - Progression Note    Patient Details  Name: Richard Vang MRN: [id number] Date of Birth: December 24, 1945  Transition of Care [facility]) CM/SW Contact  Purcell Mouton, RN Phone Number: 05/31/2019, 3:25 PM  Clinical Narrative:   Spoke with pt concerning Observation status and CHF. Pt states that he will not need HHRN.  Encouraged, pt check his weight  And call his PCP for an appointment. Spoke with pt wife concerning CHF and him being observation status. Wife will pick pt up a little after 5 PM.        Expected Discharge Plan and Services           Expected Discharge Date: 05/31/19                                     Social Determinants of Health (SDOH) Interventions    Readmission Risk Interventions Readmission Risk Prevention Plan 05/27/2018 05/27/2018  Transportation Screening - Complete  PCP or Specialist Appt within 3-5 Days Not Complete -  Home Care Screening - Complete  HRI or Home Care Consult - Complete  Palliative Care Screening - Not Complete  Palliative Care Screening Not Complete Comments - N/A- pt independent wants to return to prior level of function  Medication Review (RN Care Manager) - Complete  Some recent data might be hidden

## 2019-05-31 NOTE — Evaluation (Signed)
One Time Occupational Therapy Evaluation Patient Details Name: Richard Vang MRN: [id number] DOB: 02/21/1946 Today's Date: 05/31/2019    History of Present Illness Richard Vang is a 73 y.o. male w/ SOB x 1-2 weeks , worse over past few days.  Seen by oncologist and had CXR today, told to go to ED for "fliud on lungs". Not on O2 at home. Hx CM / EF 30%.  Hx multiple myeloma. In ED CXR read out as "RUL infiltrate" and pt got IV abx x 1 for possible PNA. Asked to see for admission.   Clinical Impression   Pt plans home today.  He mostly needs set up/supervision for adls and min guard for ambulating to bathroom.  Reviewed energy conservation and educated on pursed lip breathing.  Pt's sats at least 90 but work of breathing was increased.  No follow up OT is needed    Follow Up Recommendations  No OT follow up;Supervision/Assistance - 24 hour    Equipment Recommendations  None recommended by OT    Recommendations for Other Services       Precautions / Restrictions Precautions Precautions: Other (comment) Precaution Comments: dyspnea Restrictions Weight Bearing Restrictions: No      Mobility Bed Mobility Overal bed mobility: Modified Independent                Transfers Overall transfer level: Needs assistance Equipment used: Rolling walker (2 wheeled) Transfers: Sit to/from [employer] Transfers Sit to Stand: Supervision        General transfer comment: cues for hand placement    Balance Overall balance assessment: Mild deficits observed, not formally tested                                         ADL either performed or assessed with clinical judgement   ADL Overall ADL's : Needs assistance/impaired                         Toilet Transfer: Min guard;Ambulation;Comfort height toilet;BSC   Toileting- Clothing Manipulation and Hygiene: Supervision/safety;Sit to/from stand         General ADL Comments: pt can perform ADL  with set up/ supervision.  Pt tends to walk quickly and pull up on walker:  cues given for safety. He is able to cross legs for LB adls.  Reviewed energy conservation and pt states he does these things anyway (rest breaks/ sitting when possible/ prioritizing) since he was dx'd with CA.  Educated on pursed lip breathing     Vision         Perception     Praxis      Pertinent Vitals/Pain Pain Assessment: No/denies pain Pain Score: 2  Pain Location: back Pain Descriptors / Indicators: Discomfort Pain Intervention(s): Limited activity within patient's tolerance;Monitored during session     Hand Dominance Right   Extremity/Trunk Assessment Upper Extremity Assessment Upper Extremity Assessment: Overall WFL for tasks assessed      Cervical / Trunk Assessment Cervical / Trunk Assessment: Kyphotic   Communication Communication Communication: No difficulties   Cognition Arousal/Alertness: Awake/alert Behavior During Therapy: WFL for tasks assessed/performed Overall Cognitive Status: Within Functional Limits for tasks assessed  General Comments   initial 02 sats 94% on 1 liter.  90 after returning to bed with 2/4 dyspnea.  Increased to 92% with pursed lip breathing     Exercises     Shoulder Instructions      Home Living Family/patient expects to be discharged to:: Private residence Living Arrangements: Spouse/significant other Available Help at Discharge: Family Type of Home: House Home Access: Stairs to enter Technical brewer of Steps: 4 Entrance Stairs-Rails: Right Home Layout: One level     Bathroom Shower/Tub: Teacher, early years/pre: Handicapped height     Home Equipment: Shower seat;Bedside commode          Prior Functioning/Environment Level of Independence: Independent with assistive device(s)                 OT Problem List:        OT Treatment/Interventions:      OT  Goals(Current goals can be found in the care plan section) Acute Rehab OT Goals Patient Stated Goal: To go home today OT Goal Formulation: All assessment and education complete, DC therapy  OT Frequency:     Barriers to D/C:            Co-evaluation              AM-PAC OT "6 Clicks" Daily Activity     Outcome Measure Help from another person eating meals?: None Help from another person taking care of personal grooming?: A Little Help from another person toileting, which includes using toliet, bedpan, or urinal?: A Little Help from another person bathing (including washing, rinsing, drying)?: A Little Help from another person to put on and taking off regular upper body clothing?: A Little Help from another person to put on and taking off regular lower body clothing?: A Little 6 Click Score: 19   End of Session Nurse Communication: Mobility status  Activity Tolerance: Patient tolerated treatment well Patient left: in bed;with call bell/phone within reach  OT Visit Diagnosis: Unsteadiness on feet (R26.81)                Time: 7841-2820 OT Time Calculation (min): 15 min Charges:  OT General Charges $OT Visit: 1 Visit OT Evaluation $OT Eval Low Complexity: [place], OTR/L Acute Rehabilitation Services [phone] [facility] pager [phone] office 05/31/2019  [place] 05/31/2019, 3:01 PM

## 2019-05-31 NOTE — Progress Notes (Signed)
  Echocardiogram 2D Echocardiogram has been performed.  Richard Vang 05/31/2019, 1:52 PM

## 2019-05-31 NOTE — Care Management Obs Status (Signed)
[facility] NOTIFICATION   Patient Details  Name: Richard Vang MRN: [id number] Date of Birth: Mar 06, 1946   Medicare Observation Status Notification Given:  Yes    Purcell Mouton, RN 05/31/2019, 3:41 PM

## 2019-05-31 NOTE — Plan of Care (Addendum)
Pt ambulated around the [place] side and maintained O2 SAT between 92-94 on RA. Pt states he already feels better and less SOB.  Wt loss from admit to floor at 1930 and this am at 0530 was 2.9 kg. Urine OP for shift was 2300 mls and pt had a BM.

## 2019-05-31 NOTE — Evaluation (Signed)
Physical Therapy Evaluation Patient Details Name: Richard Vang MRN: [id number] DOB: 1946-05-09 Today's Date: 05/31/2019   History of Present Illness  Richard Vang is a 73 y.o. male w/ SOB x 1-2 weeks , worse over past few days.  Seen by oncologist and had CXR today, told to go to ED for "fliud on lungs". Not on O2 at home. Hx CM / EF 30%.  Hx multiple myeloma. In ED CXR read out as "RUL infiltrate" and pt got IV abx x 1 for possible PNA. Asked to see for admission.  Clinical Impression  Pt presents with SOB with activity. O2 sats remained between 92-96% on RA with gait. Pt did have SOB and wheezing during gait. Pt is min guard assist with gait secondary to needing some verbal cues for walker safety. Pt is pending d/c home today. Pt verbalized he will increase his walking distance at home with a RW and will follow the energy conservation techniques given to him. Pt will have his wife assisting as needed and is cleared to d/c home from a PT perspective.     Follow Up Recommendations No PT follow up    Equipment Recommendations  None recommended by PT    Recommendations for Other Services       Precautions / Restrictions Precautions Precautions: Other (comment) Precaution Comments: SOB Restrictions Weight Bearing Restrictions: No      Mobility  Bed Mobility Overal bed mobility: Modified Independent                Transfers Overall transfer level: Needs assistance Equipment used: Rolling walker (2 wheeled) Transfers: Sit to/from [employer] Transfers Sit to Stand: Supervision Stand pivot transfers: Min guard       General transfer comment: cues for hand placement for increased safety, tends to get RW too far forward and doesn't back up all the way to safely sit  Ambulation/Gait Ambulation/Gait assistance: Min guard Gait Distance (Feet): 225 Feet Assistive device: Rolling walker (2 wheeled) Gait Pattern/deviations: Step-through pattern;Decreased stride  length;Trunk flexed Gait velocity: decreased   General Gait Details: cues for more upright posture and to stay inside RW  Stairs            Wheelchair Mobility    Modified Rankin (Stroke Patients Only)       Balance Overall balance assessment: Mild deficits observed, not formally tested                                           Pertinent Vitals/Pain Pain Assessment: 0-10 Pain Score: 2  Pain Location: back Pain Descriptors / Indicators: Discomfort Pain Intervention(s): Limited activity within patient's tolerance;Monitored during session    Home Living Family/patient expects to be discharged to:: Private residence Living Arrangements: Spouse/significant other Available Help at Discharge: Family Type of Home: House Home Access: Stairs to enter Entrance Stairs-Rails: Right Entrance Stairs-Number of Steps: 4 Home Layout: One level Home Equipment: Environmental consultant - 2 wheels;Shower seat      Prior Function Level of Independence: Independent with assistive device(s)               Hand Dominance        Extremity/Trunk Assessment   Upper Extremity Assessment Upper Extremity Assessment: Defer to OT evaluation    Lower Extremity Assessment Lower Extremity Assessment: Overall WFL for tasks assessed    Cervical / Trunk Assessment Cervical / Trunk Assessment:  Kyphotic  Communication   Communication: No difficulties  Cognition Arousal/Alertness: Awake/alert Behavior During Therapy: WFL for tasks assessed/performed Overall Cognitive Status: Within Functional Limits for tasks assessed                                        General Comments      Exercises     Assessment/Plan    PT Assessment Patent does not need any further PT services  PT Problem List         PT Treatment Interventions      PT Goals (Current goals can be found in the Care Plan section)  Acute Rehab PT Goals Patient Stated Goal: To go home today     Frequency     Barriers to discharge        Co-evaluation               AM-PAC PT "6 Clicks" Mobility  Outcome Measure Help needed turning from your back to your side while in a flat bed without using bedrails?: None Help needed moving from lying on your back to sitting on the side of a flat bed without using bedrails?: None Help needed moving to and from a bed to a chair (including a wheelchair)?: None Help needed standing up from a chair using your arms (e.g., wheelchair or bedside chair)?: None Help needed to walk in hospital room?: A Little Help needed climbing 3-5 steps with a railing? : A Little 6 Click Score: 22    End of Session Equipment Utilized During Treatment: Gait belt Activity Tolerance: Patient tolerated treatment well;No increased pain Patient left: in bed;with call bell/phone within reach Nurse Communication: Mobility status PT Visit Diagnosis: Unsteadiness on feet (R26.81)    Time: 1504-1364 PT Time Calculation (min) (ACUTE ONLY): 35 min   Charges:   PT Evaluation $PT Eval Low Complexity: 1 Low PT Treatments $Gait Training: 8-22 mins        Theodoro Grist, PT  Lelon Mast 05/31/2019, 1:02 PM
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
## 2019-05-31 NOTE — Progress Notes (Signed)
OT Cancellation Note  Patient Details Name: Richard Vang MRN: [id number] DOB: Jul 06, 1946   Cancelled Treatment:    Reason Eval/Treat Not Completed: Patient at procedure or test/ unavailable. Will check back.  [place] 05/31/2019, 1:21 PM  Lesle Chris, OTR/L Acute Rehabilitation Services [phone] [facility] pager [phone] office 05/31/2019

## 2019-05-31 NOTE — Progress Notes (Signed)
Pt discharged to home instructions reviewed with patient, acknowledged understanding. SRP, RN

## 2019-05-31 NOTE — Discharge Instructions (Signed)
Pulmonary Edema Pulmonary edema is unusual buildup of fluid in the lungs. It makes it hard for a person to breathe. This condition is an emergency. Follow these instructions at home: Medicines  Take over-the-counter and prescription medicines only as told by your doctor.  If you were prescribed an antibiotic medicine, take it as told by your doctor. Do not stop taking the antibiotic even if you start to feel better.  Ask your doctor to help you write a plan with information about each medicine you take. This may include: ? Why you are taking it. ? Possible side effects. ? Best time of day to take it. ? Foods to take with it, or foods to avoid. ? When to stop taking it.  Make a list of each medicine, vitamin, or herbal supplement you take. ? Keep the list with you at all times. ? Show the list to your doctor at each visit and before starting a new medicine. ? Keep the list up to date. Lifestyle   Do regular exercise as told by your doctor. It is important to do it safely. You can do this by: ? Asking your doctor what exercises and activities are good and safe for you to do. ? Pacing your activities. This will prevent shortness of breath or chest pain. ? Resting for at least 1 hour before and after meals. ? Asking about cardiac rehabilitation programs. These may include: ? Education. ? Exercise plans. ? Counseling.  Eat a heart-healthy diet that is low in salt, saturated fat, and cholesterol. Your doctor may suggest foods that are high in fiber, such as: ? Fresh fruits and vegetables. ? Whole grains. ? Beans.  Do not use any products that contain nicotine or tobacco, such as cigarettes and e-cigarettes. If you need help quitting, ask your doctor. General instructions  Keep a record of your weight. ? Record your hospital or clinic weight. When you get home, compare it to your scale and record your weight. ? Weigh yourself first thing in the morning each day, and record the  weights. You should weigh yourself every morning after you pee and before you eat breakfast. Wear the same amount of clothing each time you weigh yourself. ? Share your weight record with your doctor. These can help your doctor see if your body is holding extra fluid. ? Tell your doctor right away if you have gained weight quickly, or if you have gained weight as told by your doctor. Your medicines may need to be adjusted.  Check your blood pressure as often as told by your doctor. ? Buy a home blood pressure cuff at your drugstore. ? Record your blood pressure readings. Bring them with you for your clinic visits.  Stay at a healthy weight. Ask your doctor what weight is healthy for you.  Think about doing therapy or being a part of a support group.  Keep all follow-up visits as told by your doctor. This is important. Contact a doctor if:  You have questions about your medicines.  You miss a dose of your medicine. Get help right away if:  You have very bad chest pain, especially if the pain is crushing or pressure-like and spreads to the arms, back, neck, or jaw.  You have more swelling in your hands, feet, ankles, or belly (abdomen).  You feel sick to your stomach (nauseous).  You have strange sweating.  Your skin turns blue or pale.  Your shortness of breath gets worse.  You feel dizzy or   unsteady.  Your vision is blurry.  You have a headache.  You cough up bloody split.  You cannot sleep because it is hard to breathe.  You start to feel a "jumping" or "fluttering" sensation (palpitations) in the chest that is unusual for you.  You feel like you cannot get enough air.  You gain weight rapidly. These symptoms may be an emergency. Do not wait to see if the symptoms will go away. Get medical help right away. Call your local emergency services (911 in the [place] Do not drive yourself to the hospital. Summary  Pulmonary edema is unusual fluid buildup in the lungs that  can make it hard to breathe.  This condition is an emergency.  Keep a record of your weight. Call your doctor if you gain weight rapidly. This information is not intended to replace advice given to you by your health care provider. Make sure you discuss any questions you have with your health care provider. Document Released: 09/24/2009 Document Revised: 09/18/2017 Document Reviewed: 01/06/2017 Elsevier Patient Education  2020 Elsevier Inc.  

## 2019-06-09 ENCOUNTER — Other Ambulatory Visit: Payer: Self-pay | Admitting: Hematology

## 2019-06-09 DIAGNOSIS — C9000 Multiple myeloma not having achieved remission: Secondary | ICD-10-CM

## 2019-06-10 MED FILL — NINLARO 3 MG CAPSULE: 3 | 28 days supply | Qty: 3 | Fill #0

## 2019-06-19 ENCOUNTER — Other Ambulatory Visit: Payer: Self-pay | Admitting: Hematology

## 2019-06-22 ENCOUNTER — Encounter: Payer: Self-pay | Admitting: Cardiology

## 2019-06-22 ENCOUNTER — Other Ambulatory Visit: Payer: Self-pay

## 2019-06-22 ENCOUNTER — Ambulatory Visit (INDEPENDENT_AMBULATORY_CARE_PROVIDER_SITE_OTHER): Payer: Medicare HMO | Admitting: Cardiology

## 2019-06-22 VITALS — BP 138/78 | HR 62 | Temp 97.2°F | Ht 70.0 in | Wt 233.0 lb

## 2019-06-22 DIAGNOSIS — N183 Chronic kidney disease, stage 3 unspecified: Secondary | ICD-10-CM

## 2019-06-22 DIAGNOSIS — I10 Essential (primary) hypertension: Secondary | ICD-10-CM | POA: Diagnosis not present

## 2019-06-22 DIAGNOSIS — E785 Hyperlipidemia, unspecified: Secondary | ICD-10-CM

## 2019-06-22 DIAGNOSIS — E119 Type 2 diabetes mellitus without complications: Secondary | ICD-10-CM

## 2019-06-22 DIAGNOSIS — Z7901 Long term (current) use of anticoagulants: Secondary | ICD-10-CM

## 2019-06-22 DIAGNOSIS — I482 Chronic atrial fibrillation, unspecified: Secondary | ICD-10-CM

## 2019-06-22 DIAGNOSIS — I5033 Acute on chronic diastolic (congestive) heart failure: Secondary | ICD-10-CM | POA: Diagnosis not present

## 2019-06-22 DIAGNOSIS — I2510 Atherosclerotic heart disease of native coronary artery without angina pectoris: Secondary | ICD-10-CM | POA: Diagnosis not present

## 2019-06-22 DIAGNOSIS — Z9861 Coronary angioplasty status: Secondary | ICD-10-CM

## 2019-06-22 DIAGNOSIS — C9000 Multiple myeloma not having achieved remission: Secondary | ICD-10-CM

## 2019-06-22 NOTE — Assessment & Plan Note (Signed)
Controlled.  

## 2019-06-22 NOTE — Assessment & Plan Note (Signed)
CAF- rate controlled 

## 2019-06-22 NOTE — Assessment & Plan Note (Signed)
GFR 48 

## 2019-06-22 NOTE — Assessment & Plan Note (Signed)
Followed by PCP

## 2019-06-22 NOTE — Assessment & Plan Note (Signed)
Coumadin Rx- [facility] VASC= 4-Dr Kale follows 

## 2019-06-22 NOTE — Assessment & Plan Note (Signed)
LAD PCI with DES after abnormal Myoview Oct 2017 (Dr Rossario pt) 

## 2019-06-22 NOTE — Patient Instructions (Signed)
Medication Instructions:  Your physician recommends that you continue on your current medications as directed. Please refer to the Current Medication list given to you today. If you need a refill on your cardiac medications before your next appointment, please call your pharmacy.   Lab work: None  If you have labs (blood work) drawn today and your tests are completely normal, you will receive your results only by: [name] MyChart Message (if you have MyChart) OR . A paper copy in the mail If you have any lab test that is abnormal or we need to change your treatment, we will call you to review the results.  Testing/Procedures: None   Follow-Up: At [facility], you and your health needs are our priority.  As part of our continuing mission to provide you with exceptional heart care, we have created designated Provider Care Teams.  These Care Teams include your primary Cardiologist (physician) and Advanced Practice Providers (APPs -  Physician Assistants and Nurse Practitioners) who all work together to provide you with the care you need, when you need it. You will need a follow up appointment in 2-3 months.  Please call our office 2 months in advance to schedule this appointment.  You may see Dr Quay Burow or one of the following Advanced Practice Providers on your designated Care Team:   Kerin Ransom, PA-C Roby Lofts, [place] . Sande Rives, PA-C  Any Other Special Instructions Will Be Listed Below (If Applicable).
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
## 2019-06-22 NOTE — Assessment & Plan Note (Signed)
Admitted to [facility] 8/10-8/08/2019- 7 lb diuresis EF 25-30% in 2017 pre PCI- EF 50-55% 05/31/2019 by echo

## 2019-07-07 MED FILL — NINLARO 3 MG CAPSULE: 3 | 28 days supply | Qty: 3 | Fill #1

## 2019-07-13 ENCOUNTER — Telehealth: Payer: Self-pay

## 2019-07-13 NOTE — Telephone Encounter (Signed)
Oral Oncology Patient Advocate Encounter  Was successful in securing patient a $11000 grant from [employer] to provide copayment coverage for [employer]. This will keep the out of pocket expense at $0.   I have spoken with the patients caregiver, [name].   The billing information is as follows and has been shared with [place].   Member ID: [id number] Group ID: [id number] RxBin: [id number] Dates of Eligibility: 06/13/19 through 06/11/20  [place] Patient [place] Phone [phone] Fax [phone] 07/13/2019    10:20 AM

## 2019-07-23 NOTE — Progress Notes (Signed)
Richard Kitchen    HEMATOLOGY/ONCOLOGY CLINIC NOTE  Date of Service: 07/25/19    Patient Care Team: Sandi Mariscal, MD as PCP - General (Internal Medicine) Lorretta Harp, MD as PCP - Cardiology (Cardiology)  CHIEF COMPLAINTS/PURPOSE OF CONSULTATION:   F/u for continued management of Myeloma   HISTORY OF PRESENTING ILLNESS:  plz see previous note for details on HPI  INTERVAL HISTORY   Richard Vang is here for his scheduled followup for multiple myeloma. The patient's last visit with [name] was on 05/25/2019. The pt reports that he is doing well overall.  The pt reports no new concerns. He does have some chronic back pain that radiates to his abdomen and ongoing fatigue. 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 Pt reports that he is tolerating his Ninlaro well. Pt denies taking any OTC pain medication, only OTC Vitamin B12. He is trying to get around as much as possible and is utilizing his walker regularly. Pt had a recent hospital visit where they had to remove fluid from his lungs but hasn't had any issues breathing since. He has been eating well and paying close attention to his salt intake.   Lab results today (07/25/19) of CBC w/diff and CMP is as follows: all values are WNL except for Hgb at 12.0, HCT at 38.8, RDW at 15.9, PLTs at 85K, Glucose at 130, BUN at 40, Creatinine at 1.57, Total Protein at 6.4, GFR Est Non Af Am at 43 . 07/25/2019 K/L light chains in progress  07/25/2019 MMP in progress  On review of systems, pt reports fatigue, back pain and denies abdominal pain, diarrhea, infection issues, leg swelling, SOB, worsening neuropathy and any other symptoms.   MEDICAL HISTORY:  Past Medical History:  Diagnosis Date  . Arthritis    "left shoulder" (07/31/2016)  . Coronary artery disease   . GERD (gastroesophageal reflux disease)   . Heart murmur   . High cholesterol   . Hypertension   . Multiple myeloma ([place])   . Sleep apnea    "probably; having test in November"  (07/31/2016)  . Type II diabetes mellitus ([place])     SURGICAL HISTORY: Past Surgical History:  Procedure Laterality Date  . CARDIAC CATHETERIZATION N/A 07/31/2016   Procedure: Left Heart Cath and Coronary Angiography;  Surgeon: Lorretta Harp, MD;  Location: [facility];  Service: Cardiovascular;  Laterality: N/A;  . CARDIAC CATHETERIZATION N/A 07/31/2016   Procedure: Coronary Stent Intervention;  Surgeon: Lorretta Harp, MD;  Location: [facility];  Service: Cardiovascular;  Laterality: N/A;  . CORONARY ANGIOPLASTY    . OPEN REDUCTION INTERNAL FIXATION (ORIF) METACARPAL Left 05/21/2018   Procedure: OPEN REDUCTION INTERNAL FIXATION (ORIF) 2ND METACARPAL;  Surgeon: Shona Needles, MD;  Location: [facility];  Service: Orthopedics;  Laterality: Left;  . ORIF CLAVICULAR FRACTURE Right 05/21/2018   Procedure: OPEN REDUCTION INTERNAL FIXATION (ORIF) CLAVICULAR FRACTURE;  Surgeon: Shona Needles, MD;  Location: [facility];  Service: Orthopedics;  Laterality: Right;  . SHOULDER SURGERY Left 1973   "put pin in it where it had separated"   . TONSILLECTOMY  ~ 1956    SOCIAL HISTORY: Social History   Socioeconomic History  . Marital status: Divorced    Spouse name: Not on file  . Number of children: Not on file  . Years of education: Not on file  . Highest education level: Not on file  Occupational History  . Not on file  Social Needs  .  Financial resource strain: Not on file  . Food insecurity    Worry: Not on file    Inability: Not on file  . Transportation needs    Medical: Not on file    Non-medical: Not on file  Tobacco Use  . Smoking status: Never Smoker  . Smokeless tobacco: Never Used  Substance and Sexual Activity  . Alcohol use: Yes    Comment: 07/31/2016 "nothing since 2002"  . Drug use: No  . Sexual activity: Not Currently  Lifestyle  . Physical activity    Days per week: Not on file    Minutes per session: Not on file  . Stress: Not on file  Relationships   . Social Herbalist on phone: Not on file    Gets together: Not on file    Attends religious service: Not on file    Active member of club or organization: Not on file    Attends meetings of clubs or organizations: Not on file    Relationship status: Not on file  . Intimate partner violence    Fear of current or ex partner: Not on file    Emotionally abused: Not on file    Physically abused: Not on file    Forced sexual activity: Not on file  Other Topics Concern  . Not on file  Social History Narrative  . Not on file    FAMILY HISTORY: Family History  Problem Relation Age of Onset  . Hypertension Other     ALLERGIES:  has No Known Allergies.  MEDICATIONS:  Current Outpatient Medications  Medication Sig Dispense Refill  . acyclovir (ZOVIRAX) 400 MG tablet Take 1 tablet by mouth twice daily (Patient taking differently: Take 400 mg by mouth 2 (two) times daily. ) 60 tablet 1  . amLODipine (NORVASC) 5 MG tablet Take 5 mg by mouth daily.    Richard Kitchen atorvastatin (LIPITOR) 40 MG tablet Take 1 tablet (40 mg total) by mouth at bedtime. 30 tablet 12  . carvedilol (COREG) 12.5 MG tablet Take 12.5 mg by mouth 2 (two) times daily with a meal.    . clopidogrel (PLAVIX) 75 MG tablet Take 1 tablet (75 mg total) by mouth daily with breakfast. 30 tablet 12  . cyclobenzaprine (FLEXERIL) 10 MG tablet Take 10 mg by mouth daily as needed for muscle spasms.   2  . escitalopram (LEXAPRO) 10 MG tablet Take 10 mg by mouth daily.    . furosemide (LASIX) 40 MG tablet Take 40 mg by mouth.    . montelukast (SINGULAIR) 10 MG tablet Take 10 mg by mouth daily.    Richard Kitchen NINLARO 3 MG capsule TAKE 1 CAPSULE (3 MG TOTAL) BY MOUTH ONCE A WEEK. ON DAY 1,8,15 EVERY 28 DAYS. TAKE ON AN EMPTY STOMACH 1HR BEFORE OR 2HRS AFTER FOOD. 3 capsule 1  . omeprazole (PRILOSEC) 40 MG capsule Take 40 mg by mouth daily with breakfast.     . oxyCODONE-acetaminophen (PERCOCET) 10-325 MG tablet Take 1 tablet by mouth 2 (two) times  daily. 10 tablet 0  . Potassium Chloride CR (MICRO-K) 8 MEQ CPCR capsule CR Take 8 mEq by mouth daily.    . SYMBICORT 160-4.5 MCG/ACT inhaler Inhale 2 puffs into the lungs 2 (two) times daily as needed (shortness of breath).     . Vitamin D, Ergocalciferol, (DRISDOL) 1.25 MG (50000 UT) CAPS capsule Take 1 capsule by mouth once a week 12 capsule 0  . warfarin (COUMADIN) 5 MG tablet Take  5 mg by mouth daily.     No current facility-administered medications for this visit.    Facility-Administered Medications Ordered in Other Visits  Medication Dose Route Frequency Provider Last Rate Last Dose  . 0.9 %  sodium chloride infusion   Intravenous Continuous Brunetta Genera, MD 10 mL/hr at 07/09/17 1423    . pamidronate (AREDIA) 60 mg in sodium chloride 0.9 % 500 mL IVPB  60 mg Intravenous Once Brunetta Genera, MD 127.5 mL/hr at 07/25/19 1123 60 mg at 07/25/19 1123    REVIEW OF SYSTEMS:    A 10+ POINT REVIEW OF SYSTEMS WAS OBTAINED including neurology, dermatology, psychiatry, cardiac, respiratory, lymph, extremities, GI, GU, Musculoskeletal, constitutional, breasts, reproductive, HEENT.  All pertinent positives are noted in the HPI.  All others are negative.   PHYSICAL EXAMINATION:  ECOG PERFORMANCE STATUS: 2 .BP (!) 145/86 (BP Location: Left Arm, Patient Position: Sitting) Comment: Dr Irene Limbo aware of bp  Pulse 100   Temp 98.6 F (37 C) (Oral)   Resp 18   Ht '5\' 10"'  (1.778 m)   Wt 234 lb 14.4 oz (106.5 kg)   SpO2 98%   BMI 33.70 kg/m   GENERAL:alert, in no acute distress and comfortable SKIN: no acute rashes, no significant lesions EYES: conjunctiva are pink and non-injected, sclera anicteric OROPHARYNX: MMM, no exudates, no oropharyngeal erythema or ulceration NECK: supple, no JVD LYMPH:  no palpable lymphadenopathy in the cervical, axillary or inguinal regions LUNGS: clear to auscultation b/l with normal respiratory effort HEART: regular rate & rhythm ABDOMEN:  normoactive  bowel sounds , non tender, not distended. No palpable hepatosplenomegaly.  Extremity: no pedal edema PSYCH: alert & oriented x 3 with fluent speech NEURO: no focal motor/sensory deficits  LABORATORY DATA:  I have reviewed the data as listed . CBC Latest Ref Rng & Units 07/25/2019 05/30/2019 05/25/2019  WBC 4.0 - 10.5 K/uL 6.6 5.6 5.5  Hemoglobin 13.0 - 17.0 g/dL 12.0(L) 11.6(L) 12.3(L)  Hematocrit 39.0 - 52.0 % 38.8(L) 39.1 39.7  Platelets 150 - 400 K/uL 85(L) 80(L) 104(L)   . CBC    Component Value Date/Time   WBC 6.6 07/25/2019 0918   RBC 4.22 07/25/2019 0918   HGB 12.0 (L) 07/25/2019 0918   HGB 10.5 (L) 02/25/2018 1219   HGB 9.2 (L) 10/01/2017 1112   HCT 38.8 (L) 07/25/2019 0918   HCT 29.3 (L) 10/01/2017 1112   PLT 85 (L) 07/25/2019 0918   PLT 132 (L) 02/25/2018 1219   PLT 170 10/01/2017 1112   MCV 91.9 07/25/2019 0918   MCV 88.5 10/01/2017 1112   MCH 28.4 07/25/2019 0918   MCHC 30.9 07/25/2019 0918   RDW 15.9 (H) 07/25/2019 0918   RDW 15.3 (H) 10/01/2017 1112   LYMPHSABS 0.7 07/25/2019 0918   LYMPHSABS 0.5 (L) 10/01/2017 1112   MONOABS 0.6 07/25/2019 0918   MONOABS 0.5 10/01/2017 1112   EOSABS 0.2 07/25/2019 0918   EOSABS 0.1 10/01/2017 1112   BASOSABS 0.0 07/25/2019 0918   BASOSABS 0.0 10/01/2017 1112    . CMP Latest Ref Rng & Units 07/25/2019 05/31/2019 05/30/2019  Glucose 70 - 99 mg/dL 130(H) 118(H) 111(H)  BUN 8 - 23 mg/dL 40(H) 34(H) 34(H)  Creatinine 0.61 - 1.24 mg/dL 1.57(H) 1.46(H) 1.46(H)  Sodium 135 - 145 mmol/L 142 140 141  Potassium 3.5 - 5.1 mmol/L 4.0 3.6 4.3  Chloride 98 - 111 mmol/L 110 102 107  CO2 22 - 32 mmol/L '24 25 24  ' Calcium 8.9 - 10.3  mg/dL 9.0 9.1 8.8(L)  Total Protein 6.5 - 8.1 g/dL 6.4(L) - 7.2  Total Bilirubin 0.3 - 1.2 mg/dL 0.8 - 1.2  Alkaline Phos 38 - 126 U/L 81 - 78  AST 15 - 41 U/L 25 - 35  ALT 0 - 44 U/L 21 - 26   03/18/2019 MMP: Component     Latest Ref Rng & Units 03/18/2019  IgG (Immunoglobin G), Serum     603 - 1,613  mg/dL 1,012  IgA     61 - 437 mg/dL 51 (L)  IgM (Immunoglobulin M), Srm     15 - 143 mg/dL 27  Total Protein ELP     6.0 - 8.5 g/dL 6.3  Albumin SerPl Elph-Mcnc     2.9 - 4.4 g/dL 3.8  Alpha 1     0.0 - 0.4 g/dL 0.2  Alpha2 Glob SerPl Elph-Mcnc     0.4 - 1.0 g/dL 0.7  B-Globulin SerPl Elph-Mcnc     0.7 - 1.3 g/dL 0.8  Gamma Glob SerPl Elph-Mcnc     0.4 - 1.8 g/dL 0.8  M Protein SerPl Elph-Mcnc     Not Observed g/dL Not Observed  Globulin, Total     2.2 - 3.9 g/dL 2.5  Albumin/Glob SerPl     0.7 - 1.7 1.6  IFE 1      Comment  Please Note (HCV):      Comment    RADIOGRAPHIC STUDIES: I have personally reviewed the radiological images as listed and agreed with the findings in the report. No results found.  ASSESSMENT & PLAN:   73 y.o. 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  K/L 95, No M spike on SPEP -- suggests light chain MM Cytogenetics - normal male chromosomes FISH- +11 and 13q-/-13  Patient is s/p 1 cycle of Vd and Serum free kappa LC has decreased from 700 to 203.6 with improvement in his K/L ratio from 94.59 to 29. Completed 2nd cycle of treatment with Vd + Cytoxan (238m/m2) Completed 3rd cycle of treatment with Vd + Cytoxan (2086mm2) - cytoxan held D15 due to thrombocytopenia Completed 4th of VCd Discontinued VCd after C5D8 due to intolerance with diarrhea and increasing neuropathy and fatigue with borderline functional status. -Patient did  not tolerate maintenance Revlimid 10 mg by mouth daily due to grade 2-3 diarrhea. This was discontinued and his diarrhea has resolved.  Currently on maintenance Ninlaro M spike absent   11/15/18 CT Pelvic revealed  3 mm stone in the right UVJ. 5 x 6 mm stone is identified in the left UVJ. No associated distal hydroureter. 2. Stable lucent lesion in the right iliac bone. 3. Small right groin hernia contains only fat. 11/15/18 CT Lumbar revealed  No acute fracture or malalignment. 2. Severe osteopenia and multiple old compression fractures, severe at T11 and T12. 3. Severe canal stenosis L3-4. Multilevel neural foraminal narrowing: Moderate to severe at T12-L1. 4. Slowly enlarging solid LEFT renal mass. Recommend non emergent MRI renal mass protocol on non emergent basis -No obvious new myeloma involvement seen in most  recent CT imaging.  PLAN:  -Discussed pt labwork today, 07/25/19; all values are WNL except for Hgb at 12.0, HCT at 38.8, RDW at 15.9, PLTs at 85K, Glucose at 130, BUN at 40, Creatinine at 1.57, Total Protein at 6.4, GFR Est Non Af Am at 43 . -Discussed 07/25/2019 K/L light chains in progress  -Discussed 07/25/2019 MMP in progress -Discussed 05/25/2019 MMP is as follows: all values are WNL except for IgA at 50, IFE 1 shows " The immunofixation pattern appears unremarkable. Evidence of monoclonal protein is not apparent." -Discussed 05/25/2019 K/L light chains is as follows: all values are WNL except for Kappa free light chain at 46.1, Lamda free light chains at 13.8, K/L light chain ratio at 3.34 -The pt has no prohibitive toxicities from continuing 41m Ninlaro on D1, D8, and D15 at this time. 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 -Continue Vitamin D replacement weekly -Continue Aredia every 8 weeks -Continue B12 replacement -Continue acyclovir for shingles prophylaxis. -Continue aspirin -Will see back in 2 months with labs          2) CAD s/p prox LAD DES PCI on  07/31/2016 with ischemic cardiomyopathy ejection fraction of 25-35%. Rpt ECHO on 10/03/2016 shows improvement in EF to 55-60% with no RWMABN.  3) Macrocytic Anemia with moderate thrombocytopenia - due to MM and and Ninlaro. Stable.  4) Thrombocytopenia - due to MM and treatment --improved today 108k. --will monitor  5) B12 deficiency - Plan -cont SL B12 10059m po daily  6) h/o Afib with RVR on coumadin Some bradycardia with BB -continue mx per PCP and cardiology  7) Back pain -- multiple chronic compression fractures with some worsening -On bisphosphonates already -Ergocalciferol 50k units weekly -Advised that the pt not lift anything heavy  -Discussed the 11/15/18 CT imaging, as noted above, did refer pt to orthopedist -Pt will be following up with orthopedist in December 2020  FOLLOW UP: -Plz schedule next dose of Pamidronate, labs and MD visit in 2 months.   The total time spent in the appt was 25 minutes and more than 50% was on counseling and direct patient cares.  All of the patient's questions were answered with apparent satisfaction. The patient knows to call the clinic with any problems, questions or concerns.  GaSullivan LoneD MSLeonardAHIVMS SCSt Joseph'S Hospital SouthTCapitola Surgery Centerematology/Oncology Physician CoSt. Landry Extended Care Hospital(Office):       [phone] cell):  [phone]):           [phone], JaYevette Edwardsam acting as a scribe for Dr. GaSullivan Lone  .I have reviewed the above documentation for accuracy and completeness, and I agree with the above. .GBrunetta GeneraD

## 2019-07-25 ENCOUNTER — Inpatient Hospital Stay: Payer: Medicare HMO | Attending: Hematology

## 2019-07-25 ENCOUNTER — Inpatient Hospital Stay: Payer: Medicare HMO

## 2019-07-25 ENCOUNTER — Other Ambulatory Visit: Payer: Self-pay

## 2019-07-25 ENCOUNTER — Inpatient Hospital Stay: Payer: Medicare HMO | Admitting: Hematology

## 2019-07-25 VITALS — BP 145/86 | HR 100 | Temp 98.6°F | Resp 18 | Ht 70.0 in | Wt 234.9 lb

## 2019-07-25 DIAGNOSIS — G8929 Other chronic pain: Secondary | ICD-10-CM | POA: Insufficient documentation

## 2019-07-25 DIAGNOSIS — Z7901 Long term (current) use of anticoagulants: Secondary | ICD-10-CM | POA: Insufficient documentation

## 2019-07-25 DIAGNOSIS — M1990 Unspecified osteoarthritis, unspecified site: Secondary | ICD-10-CM | POA: Insufficient documentation

## 2019-07-25 DIAGNOSIS — D696 Thrombocytopenia, unspecified: Secondary | ICD-10-CM

## 2019-07-25 DIAGNOSIS — I10 Essential (primary) hypertension: Secondary | ICD-10-CM | POA: Diagnosis not present

## 2019-07-25 DIAGNOSIS — Z7902 Long term (current) use of antithrombotics/antiplatelets: Secondary | ICD-10-CM | POA: Insufficient documentation

## 2019-07-25 DIAGNOSIS — Z7951 Long term (current) use of inhaled steroids: Secondary | ICD-10-CM | POA: Diagnosis not present

## 2019-07-25 DIAGNOSIS — G4730 Sleep apnea, unspecified: Secondary | ICD-10-CM | POA: Diagnosis not present

## 2019-07-25 DIAGNOSIS — I2510 Atherosclerotic heart disease of native coronary artery without angina pectoris: Secondary | ICD-10-CM | POA: Diagnosis not present

## 2019-07-25 DIAGNOSIS — E1140 Type 2 diabetes mellitus with diabetic neuropathy, unspecified: Secondary | ICD-10-CM | POA: Diagnosis not present

## 2019-07-25 DIAGNOSIS — C9000 Multiple myeloma not having achieved remission: Secondary | ICD-10-CM | POA: Diagnosis present

## 2019-07-25 DIAGNOSIS — Z79899 Other long term (current) drug therapy: Secondary | ICD-10-CM | POA: Diagnosis not present

## 2019-07-25 DIAGNOSIS — M549 Dorsalgia, unspecified: Secondary | ICD-10-CM | POA: Insufficient documentation

## 2019-07-25 DIAGNOSIS — Z7189 Other specified counseling: Secondary | ICD-10-CM

## 2019-07-25 DIAGNOSIS — K219 Gastro-esophageal reflux disease without esophagitis: Secondary | ICD-10-CM | POA: Diagnosis not present

## 2019-07-25 LAB — CBC WITH DIFFERENTIAL/PLATELET
Abs Immature Granulocytes: 0.02 10*3/uL (ref 0.00–0.07)
Basophils Absolute: 0 10*3/uL (ref 0.0–0.1)
Basophils Relative: 0 %
Eosinophils Absolute: 0.2 10*3/uL (ref 0.0–0.5)
Eosinophils Relative: 2 %
HCT: 38.8 % — ABNORMAL LOW (ref 39.0–52.0)
Hemoglobin: 12 g/dL — ABNORMAL LOW (ref 13.0–17.0)
Immature Granulocytes: 0 %
Lymphocytes Relative: 11 %
Lymphs Abs: 0.7 10*3/uL (ref 0.7–4.0)
MCH: 28.4 pg (ref 26.0–34.0)
MCHC: 30.9 g/dL (ref 30.0–36.0)
MCV: 91.9 fL (ref 80.0–100.0)
Monocytes Absolute: 0.6 10*3/uL (ref 0.1–1.0)
Monocytes Relative: 10 %
Neutro Abs: 5.1 10*3/uL (ref 1.7–7.7)
Neutrophils Relative %: 77 %
Platelets: 85 10*3/uL — ABNORMAL LOW (ref 150–400)
RBC: 4.22 MIL/uL (ref 4.22–5.81)
RDW: 15.9 % — ABNORMAL HIGH (ref 11.5–15.5)
WBC: 6.6 10*3/uL (ref 4.0–10.5)
nRBC: 0 % (ref 0.0–0.2)

## 2019-07-25 LAB — CMP (CANCER CENTER ONLY)
ALT: 21 U/L (ref 0–44)
AST: 25 U/L (ref 15–41)
Albumin: 3.8 g/dL (ref 3.5–5.0)
Alkaline Phosphatase: 81 U/L (ref 38–126)
Anion gap: 8 (ref 5–15)
BUN: 40 mg/dL — ABNORMAL HIGH (ref 8–23)
CO2: 24 mmol/L (ref 22–32)
Calcium: 9 mg/dL (ref 8.9–10.3)
Chloride: 110 mmol/L (ref 98–111)
Creatinine: 1.57 mg/dL — ABNORMAL HIGH (ref 0.61–1.24)
GFR, Est AFR Am: 50 mL/min — ABNORMAL LOW (ref >60)
GFR, Estimated: 43 mL/min — ABNORMAL LOW (ref >60)
Glucose, Bld: 130 mg/dL — ABNORMAL HIGH (ref 70–99)
Potassium: 4 mmol/L (ref 3.5–5.1)
Sodium: 142 mmol/L (ref 135–145)
Total Bilirubin: 0.8 mg/dL (ref 0.3–1.2)
Total Protein: 6.4 g/dL — ABNORMAL LOW (ref 6.5–8.1)

## 2019-07-25 MED ORDER — SODIUM CHLORIDE 0.9 % IV SOLN
60.0000 mg | Freq: Once | INTRAVENOUS | Status: AC
  Administered 2019-07-25: 11:00:00 60 mg via INTRAVENOUS
  Filled 2019-07-25: qty 10

## 2019-07-25 NOTE — Patient Instructions (Signed)
Pamidronate injection What is this medicine? PAMIDRONATE (pa mi DROE nate) slows calcium loss from bones. It is used to treat high calcium blood levels from cancer or Paget's disease. It is also used to treat bone pain and prevent fractures from certain cancers that have spread to the bone. This medicine may be used for other purposes; ask your health care provider or pharmacist if you have questions. COMMON BRAND NAME(S): Aredia What should I tell my health care provider before I take this medicine? They need to know if you have any of these conditions:  aspirin-sensitive asthma  dental disease  kidney disease  an unusual or allergic reaction to pamidronate, other medicines, foods, dyes, or preservatives  pregnant or trying to get pregnant  breast-feeding How should I use this medicine? This medicine is for infusion into a vein. It is given by a health care professional in a hospital or clinic setting. Talk to your pediatrician regarding the use of this medicine in children. This medicine is not approved for use in children. Overdosage: If you think you have taken too much of this medicine contact a poison control center or emergency room at once. NOTE: This medicine is only for you. Do not share this medicine with others. What if I miss a dose? This does not apply. What may interact with this medicine?  certain antibiotics given by injection  medicines for inflammation or pain like ibuprofen, naproxen  some diuretics like bumetanide, furosemide  cyclosporine  parathyroid hormone  tacrolimus  teriparatide  thalidomide This list may not describe all possible interactions. Give your health care provider a list of all the medicines, herbs, non-prescription drugs, or dietary supplements you use. Also tell them if you smoke, drink alcohol, or use illegal drugs. Some items may interact with your medicine. What should I watch for while using this medicine? Visit your doctor or  health care professional for regular checkups. It may be some time before you see the benefit from this medicine. Do not stop taking your medicine unless your doctor tells you to. Your doctor may order blood tests or other tests to see how you are doing. Women should inform their doctor if they wish to become pregnant or think they might be pregnant. There is a potential for serious side effects to an unborn child. Talk to your health care professional or pharmacist for more information. You should make sure that you get enough calcium and vitamin D while you are taking this medicine. Discuss the foods you eat and the vitamins you take with your health care professional. Some people who take this medicine have severe bone, joint, and/or muscle pain. This medicine may also increase your risk for a broken thigh bone. Tell your doctor right away if you have pain in your upper leg or groin. Tell your doctor if you have any pain that does not go away or that gets worse. What side effects may I notice from receiving this medicine? Side effects that you should report to your doctor or health care professional as soon as possible:  allergic reactions like skin rash, itching or hives, swelling of the face, lips, or tongue  black or tarry stools  changes in vision  eye inflammation, pain  high blood pressure  jaw pain, especially burning or cramping  muscle weakness  numb, tingling pain  swelling of feet or hands  trouble passing urine or change in the amount of urine  unable to move easily Side effects that usually do not  require medical attention (report to your doctor or health care professional if they continue or are bothersome):  bone, joint, or muscle pain  constipation  dizzy, drowsy  fever  headache  loss of appetite  nausea, vomiting  pain at site where injected This list may not describe all possible side effects. Call your doctor for medical advice about side effects.  You may report side effects to FDA at 1-800-FDA-1088. Where should I keep my medicine? This drug is given in a hospital or clinic and will not be stored at home. NOTE: This sheet is a summary. It may not cover all possible information. If you have questions about this medicine, talk to your doctor, pharmacist, or health care provider.  2020 Elsevier/Gold Standard (2011-04-04 08:49:49)   Rehydration, Adult Rehydration is the replacement of body fluids and salts and minerals (electrolytes) that are lost during dehydration. Dehydration is when there is not enough fluid or water in the body. This happens when you lose more fluids than you take in. Common causes of dehydration include:  Vomiting.  Diarrhea.  Excessive sweating, such as from heat exposure or exercise.  Taking medicines that cause the body to lose excess fluid (diuretics).  Impaired kidney function.  Not drinking enough fluid.  Certain illnesses or infections.  Certain poorly controlled long-term (chronic) illnesses, such as diabetes, heart disease, and kidney disease.  Symptoms of mild dehydration may include thirst, dry lips and mouth, dry skin, and dizziness. Symptoms of severe dehydration may include increased heart rate, confusion, fainting, and not urinating. You can rehydrate by drinking certain fluids or getting fluids through an IV tube, as told by your health care provider. What are the risks? Generally, rehydration is safe. However, one problem that can happen is taking in too much fluid (overhydration). This is rare. If overhydration happens, it can cause an electrolyte imbalance, kidney failure, or a decrease in salt (sodium) levels in the body. How to rehydrate Follow instructions from your health care provider for rehydration. The kind of fluid you should drink and the amount you should drink depend on your condition.  If directed by your health care provider, drink an oral rehydration solution (ORS). This  is a drink designed to treat dehydration that is found in pharmacies and retail stores. ? Make an ORS by following instructions on the package. ? Start by drinking small amounts, about  cup (120 mL) every 5-10 minutes. ? Slowly increase how much you drink until you have taken the amount recommended by your health care provider.  Drink enough clear fluids to keep your urine clear or pale yellow. If you were instructed to drink an ORS, finish the ORS first, then start slowly drinking other clear fluids. Drink fluids such as: ? Water. Do not drink only water. Doing that can lead to having too little sodium in your body (hyponatremia). ? Ice chips. ? Fruit juice that you have added water to (diluted juice). ? Low-calorie sports drinks.  If you are severely dehydrated, your health care provider may recommend that you receive fluids through an IV tube in the hospital.  Do not take sodium tablets. Doing that can lead to the condition of having too much sodium in your body (hypernatremia). Eating while you rehydrate Follow instructions from your health care provider about what to eat while you rehydrate. Your health care provider may recommend that you slowly begin eating regular foods in small amounts.  Eat foods that contain a healthy balance of electrolytes, such as bananas,  oranges, potatoes, tomatoes, and spinach.  Avoid foods that are greasy or contain a lot of fat or sugar.  In some cases, you may get nutrition through a feeding tube that is passed through your nose and into your stomach (nasogastric tube, or NG tube). This may be done if you have uncontrolled vomiting or diarrhea. Beverages to avoid Certain beverages may make dehydration worse. While you rehydrate, avoid:  Alcohol.  Caffeine.  Drinks that contain a lot of sugar. These include: ? High-calorie sports drinks. ? Fruit juice that is not diluted. ? Soda.  Check nutrition labels to see how much sugar or caffeine a  beverage contains. Signs of dehydration recovery You may be recovering from dehydration if:  You are urinating more often than before you started rehydrating.  Your urine is clear or pale yellow.  Your energy level improves.  You vomit less frequently.  You have diarrhea less frequently.  Your appetite improves or returns to normal.  You feel less dizzy or less light-headed.  Your skin tone and color start to look more normal. Contact a health care provider if:  You continue to have symptoms of mild dehydration, such as: ? Thirst. ? Dry lips. ? Slightly dry mouth. ? Dry, warm skin. ? Dizziness.  You continue to vomit or have diarrhea. Get help right away if:  You have symptoms of dehydration that get worse.  You feel: ? Confused. ? Weak. ? Like you are going to faint.  You have not urinated in 6-8 hours.  You have very dark urine.  You have trouble breathing.  Your heart rate while sitting still is over 100 beats a minute.  You cannot drink fluids without vomiting.  You have vomiting or diarrhea that: ? Gets worse. ? Does not go away.  You have a fever. This information is not intended to replace advice given to you by your health care provider. Make sure you discuss any questions you have with your health care provider. Document Released: 12/29/2011 Document Revised: 09/18/2017 Document Reviewed: 11/30/2015 Elsevier Patient Education  2020 Elsevier Inc.  Coronavirus (COVID-19) Are you at risk?  Are you at risk for the Coronavirus (COVID-19)?  To be considered HIGH RISK for Coronavirus (COVID-19), you have to meet the following criteria:  . Traveled to [place], [place], [name], [place] or [place]; or in the [place] to [place], [place], [place], or [place]; and have fever, cough, and shortness of breath within the last 2 weeks of travel OR . Been in close contact with a person diagnosed with COVID-19 within the last 2 weeks and have fever,  cough, and shortness of breath . IF YOU DO NOT MEET THESE CRITERIA, YOU ARE CONSIDERED LOW RISK FOR COVID-19.  What to do if you are HIGH RISK for COVID-19?  . If you are having a medical emergency, call 911. . Seek medical care right away. Before you go to a doctor's office, urgent care or emergency department, call ahead and tell them about your recent travel, contact with someone diagnosed with COVID-19, and your symptoms. You should receive instructions from your physician's office regarding next steps of care.  . When you arrive at healthcare provider, tell the healthcare staff immediately you have returned from visiting [place], [place], [place], [place] or [name]; or traveled in the [place] to [place], [place], [place], or [place]; in the last two weeks or you have been in close contact with a person diagnosed with COVID-19 in   the last 2 weeks.   . Tell the health care staff about your symptoms: fever, cough and shortness of breath. . After you have been seen by a medical provider, you will be either: o Tested for (COVID-19) and discharged home on quarantine except to seek medical care if symptoms worsen, and asked to  - Stay home and avoid contact with others until you get your results (4-5 days)  - Avoid travel on public transportation if possible (such as bus, train, or airplane) or o Sent to the Emergency Department by EMS for evaluation, COVID-19 testing, and possible admission depending on your condition and test results.  What to do if you are LOW RISK for COVID-19?  Reduce your risk of any infection by using the same precautions used for avoiding the common cold or flu:  . Wash your hands often with soap and warm water for at least 20 seconds.  If soap and water are not readily available, use an alcohol-based hand sanitizer with at least 60% alcohol.  . If coughing or sneezing, cover your mouth and nose by coughing or sneezing into the elbow areas of your shirt or coat,  into a tissue or into your sleeve (not your hands). . Avoid shaking hands with others and consider head nods or verbal greetings only. . Avoid touching your eyes, nose, or mouth with unwashed hands.  . Avoid close contact with people who are sick. . Avoid places or events with large numbers of people in one location, like concerts or sporting events. . Carefully consider travel plans you have or are making. . If you are planning any travel outside or inside the [place], visit the CDC's Travelers' Health webpage for the latest health notices. . If you have some symptoms but not all symptoms, continue to monitor at home and seek medical attention if your symptoms worsen. . If you are having a medical emergency, call 911.   ADDITIONAL HEALTHCARE OPTIONS FOR PATIENTS  [facility] / e-Visit: [email or web address]         [facility]: [phone]  [facility]: [phone]                   [facility]: [phone]   

## 2019-07-25 NOTE — Progress Notes (Signed)
Okay to treat today with Creat. 1.57 and plts 85, per Dr. Irene Limbo.

## 2019-07-26 ENCOUNTER — Telehealth: Payer: Self-pay | Admitting: Hematology

## 2019-07-26 LAB — KAPPA/LAMBDA LIGHT CHAINS
Kappa free light chain: 50.9 mg/L — ABNORMAL HIGH (ref 3.3–19.4)
Kappa, lambda light chain ratio: 3.64 — ABNORMAL HIGH (ref 0.26–1.65)
Lambda free light chains: 14 mg/L (ref 5.7–26.3)

## 2019-07-26 NOTE — Telephone Encounter (Signed)
Scheduled appt per 10/5 los.  Spoke with patient wife and she is aware of the appt date and time.

## 2019-07-27 LAB — MULTIPLE MYELOMA PANEL, SERUM
Albumin SerPl Elph-Mcnc: 3.6 g/dL (ref 2.9–4.4)
Albumin/Glob SerPl: 1.4 (ref 0.7–1.7)
Alpha 1: 0.2 g/dL (ref 0.0–0.4)
Alpha2 Glob SerPl Elph-Mcnc: 0.7 g/dL (ref 0.4–1.0)
B-Globulin SerPl Elph-Mcnc: 0.8 g/dL (ref 0.7–1.3)
Gamma Glob SerPl Elph-Mcnc: 0.9 g/dL (ref 0.4–1.8)
Globulin, Total: 2.6 g/dL (ref 2.2–3.9)
IgA: 53 mg/dL — ABNORMAL LOW (ref 61–437)
IgG (Immunoglobin G), Serum: 950 mg/dL (ref 603–1613)
IgM (Immunoglobulin M), Srm: 24 mg/dL (ref 15–143)
Total Protein ELP: 6.2 g/dL (ref 6.0–8.5)

## 2019-08-01 ENCOUNTER — Other Ambulatory Visit: Payer: Self-pay | Admitting: Hematology

## 2019-08-01 DIAGNOSIS — C9000 Multiple myeloma not having achieved remission: Secondary | ICD-10-CM

## 2019-08-02 ENCOUNTER — Other Ambulatory Visit: Payer: Self-pay | Admitting: Hematology

## 2019-08-02 DIAGNOSIS — C9000 Multiple myeloma not having achieved remission: Secondary | ICD-10-CM

## 2019-08-04 MED FILL — NINLARO 3 MG CAPSULE: 3 | 28 days supply | Qty: 3 | Fill #0

## 2019-08-23 ENCOUNTER — Encounter: Payer: Self-pay | Admitting: Cardiovascular Disease

## 2019-08-23 ENCOUNTER — Other Ambulatory Visit: Payer: Self-pay

## 2019-08-23 ENCOUNTER — Ambulatory Visit (INDEPENDENT_AMBULATORY_CARE_PROVIDER_SITE_OTHER): Payer: Medicare HMO | Admitting: Cardiovascular Disease

## 2019-08-23 VITALS — BP 107/63 | HR 70 | Ht 70.0 in | Wt 229.8 lb

## 2019-08-23 DIAGNOSIS — I10 Essential (primary) hypertension: Secondary | ICD-10-CM

## 2019-08-23 DIAGNOSIS — E785 Hyperlipidemia, unspecified: Secondary | ICD-10-CM | POA: Diagnosis not present

## 2019-08-23 DIAGNOSIS — Z9861 Coronary angioplasty status: Secondary | ICD-10-CM

## 2019-08-23 DIAGNOSIS — I5033 Acute on chronic diastolic (congestive) heart failure: Secondary | ICD-10-CM

## 2019-08-23 DIAGNOSIS — I2510 Atherosclerotic heart disease of native coronary artery without angina pectoris: Secondary | ICD-10-CM

## 2019-08-23 DIAGNOSIS — E119 Type 2 diabetes mellitus without complications: Secondary | ICD-10-CM | POA: Diagnosis not present

## 2019-08-23 DIAGNOSIS — I482 Chronic atrial fibrillation, unspecified: Secondary | ICD-10-CM | POA: Diagnosis not present

## 2019-08-23 LAB — LIPID PANEL
Chol/HDL Ratio: 2.2 ratio (ref 0.0–5.0)
Cholesterol, Total: 100 mg/dL (ref 100–199)
HDL: 46 mg/dL (ref >39)
LDL Chol Calc (NIH): 37 mg/dL (ref 0–99)
Triglycerides: 82 mg/dL (ref 0–149)
VLDL Cholesterol Cal: 17 mg/dL (ref 5–40)

## 2019-08-23 LAB — HEPATIC FUNCTION PANEL
ALT: 17 IU/L (ref 0–44)
AST: 27 IU/L (ref 0–40)
Albumin: 3.9 g/dL (ref 3.7–4.7)
Alkaline Phosphatase: 95 IU/L (ref 39–117)
Bilirubin Total: 0.7 mg/dL (ref 0.0–1.2)
Bilirubin, Direct: 0.22 mg/dL (ref 0.00–0.40)
Total Protein: 6.5 g/dL (ref 6.0–8.5)

## 2019-08-23 NOTE — Assessment & Plan Note (Signed)
Patient was admitted for 2 days and August for shortness of breath and was found at the end diastolic heart failure.  His EF by 2D echo performed 05/30/2019 was normal.  Diastolic function could not be evaluated.  He was diuresed and sent home on twice daily Lasix for a week and transition to daily Lasix.  He did admit to dietary indiscretion prior to admission and currently is watching his salt intake.  He feels clinically improved.

## 2019-08-23 NOTE — Assessment & Plan Note (Signed)
History of CAD status post LAD PCI and drug-eluting stenting by myself 07/31/2016.  The time his EF was 25 to 35% by visual estimation.  This was done after an abnormal Myoview stress test and symptoms of dyspnea.  He was referred initially by Dr. Claudie Leach.  He currently denies chest pain or shortness of breath.  He is on clopidogrel.

## 2019-08-23 NOTE — Patient Instructions (Signed)
Medication Instructions:  Your physician recommends that you continue on your current medications as directed. Please refer to the Current Medication list given to you today.  If you need a refill on your cardiac medications before your next appointment, please call your pharmacy.   Lab work: Lipids and Hepatic Function If you have labs (blood work) drawn today and your tests are completely normal, you will receive your results only by: [facility] (if you have MyChart) OR A paper copy in the mail If you have any lab test that is abnormal or we need to change your treatment, we will call you to review the results.  Testing/Procedures: NONE  Follow-Up: At [facility], you and your health needs are our priority.  As part of our continuing mission to provide you with exceptional heart care, we have created designated Provider Care Teams.  These Care Teams include your primary Cardiologist (physician) and Advanced Practice Providers (APPs -  Physician Assistants and Nurse Practitioners) who all work together to provide you with the care you need, when you need it. You may see Quay Burow, MD or one of the following Advanced Practice Providers on your designated Care Team:    Kerin Ransom, PA-C  [place], [place]  Coletta Memos, [place]  Your physician wants you to follow-up in: 6 months with Kerin Ransom and Dr Gwenlyn Found in 1 year. You will receive a reminder letter in the mail two months in advance. If you don't receive a letter, please call our office to schedule the follow-up appointment.

## 2019-08-23 NOTE — Progress Notes (Signed)
08/23/2019 Richard Vang   [birth year]  AC:[id number]  Primary Physician Sandi Mariscal, MD Primary Cardiologist: Lorretta Harp MD [name], [place]  HPI:  Richard Vang is a 73 y.o.  moderate to severely overweight married Caucasian male father of 2 living children (1 deceased), grandfather of 5 grandchildren referred by Dr. Claudie Leach initially for cardiac catheterization. He is retired working part-time at [employer] .  I last saw him in the office 07/18/2016.  His risk factors include treated hypertension, diabetes and hyperlipidemia. His father did die of a myocardial infarction at age 11. He has never had a heart attack or stroke.  He was having increasing dyspnea on exertion for the last 4-6 week prior to seeing me.  A Myoview stress test showed LV dysfunction with an EF of 40% and mild anterior ischemia.  I performed cardiac catheterization on him 07/31/2016 revealing a high-grade proximal LAD stenosis which had stented using a drug-eluting stent.  His symptoms ultimately improved although I never saw him back after that.  He also has chronic atrial fibrillation on Coumadin anticoagulation.  He was admitted in August with diastolic heart failure was diuresed.  He is now on daily Lasix and is watching his salt intake.  He feels clinically improved, denies chest pain or shortness of breath.   Current Meds  Medication Sig  . acyclovir (ZOVIRAX) 400 MG tablet Take 1 tablet by mouth twice daily  . amLODipine (NORVASC) 5 MG tablet Take 5 mg by mouth daily.  [name] atorvastatin (LIPITOR) 40 MG tablet Take 1 tablet (40 mg total) by mouth at bedtime.  . carvedilol (COREG) 12.5 MG tablet Take 12.5 mg by mouth 2 (two) times daily with a meal.  . clopidogrel (PLAVIX) 75 MG tablet Take 1 tablet (75 mg total) by mouth daily with breakfast.  . cyclobenzaprine (FLEXERIL) 10 MG tablet Take 10 mg by mouth daily as needed for muscle spasms.   [name] escitalopram (LEXAPRO) 10 MG tablet Take 10 mg by mouth daily.   . furosemide (LASIX) 40 MG tablet Take 40 mg by mouth.  . montelukast (SINGULAIR) 10 MG tablet Take 10 mg by mouth daily.  [name] NINLARO 3 MG capsule TAKE 1 CAPSULE (3 MG TOTAL) BY MOUTH ONCE A WEEK. ON DAY 1,8,15 EVERY 28 DAYS. TAKE ON AN EMPTY STOMACH 1HR BEFORE OR 2HRS AFTER FOOD.  [name] omeprazole (PRILOSEC) 40 MG capsule Take 40 mg by mouth daily with breakfast.   . oxyCODONE-acetaminophen (PERCOCET) 10-325 MG tablet Take 1 tablet by mouth 2 (two) times daily.  . Potassium Chloride CR (MICRO-K) 8 MEQ CPCR capsule CR Take 8 mEq by mouth daily.  . SYMBICORT 160-4.5 MCG/ACT inhaler Inhale 2 puffs into the lungs 2 (two) times daily as needed (shortness of breath).   . Vitamin D, Ergocalciferol, (DRISDOL) 1.25 MG (50000 UT) CAPS capsule Take 1 capsule by mouth once a week  . warfarin (COUMADIN) 5 MG tablet Take 5 mg by mouth daily.     No Known Allergies  Social History   Socioeconomic History  . Marital status: Divorced    Spouse name: Not on file  . Number of children: Not on file  . Years of education: Not on file  . Highest education level: Not on file  Occupational History  . Not on file  Social Needs  . Financial resource strain: Not on file  . Food insecurity    Worry: Not on file    Inability: Not on file  .  Transportation needs    Medical: Not on file    Non-medical: Not on file  Tobacco Use  . Smoking status: Never Smoker  . Smokeless tobacco: Never Used  Substance and Sexual Activity  . Alcohol use: Yes    Comment: 07/31/2016 "nothing since 2002"  . Drug use: No  . Sexual activity: Not Currently  Lifestyle  . Physical activity    Days per week: Not on file    Minutes per session: Not on file  . Stress: Not on file  Relationships  . Social Herbalist on phone: Not on file    Gets together: Not on file    Attends religious service: Not on file    Active member of club or organization: Not on file    Attends meetings of clubs or organizations: Not on file     Relationship status: Not on file  . Intimate partner violence    Fear of current or ex partner: Not on file    Emotionally abused: Not on file    Physically abused: Not on file    Forced sexual activity: Not on file  Other Topics Concern  . Not on file  Social History Narrative  . Not on file     Review of Systems: General: negative for chills, fever, night sweats or weight changes.  Cardiovascular: negative for chest pain, dyspnea on exertion, edema, orthopnea, palpitations, paroxysmal nocturnal dyspnea or shortness of breath Dermatological: negative for rash Respiratory: negative for cough or wheezing Urologic: negative for hematuria Abdominal: negative for nausea, vomiting, diarrhea, bright red blood per rectum, melena, or hematemesis Neurologic: negative for visual changes, syncope, or dizziness All other systems reviewed and are otherwise negative except as noted above.    Blood pressure 107/63, pulse 70, height 5\' 10"  (1.778 m), weight 229 lb 12.8 oz (104.2 kg), SpO2 96 %.  General appearance: alert and no distress Neck: no adenopathy, no carotid bruit, no JVD, supple, symmetrical, trachea midline and thyroid not enlarged, symmetric, no tenderness/mass/nodules Lungs: clear to auscultation bilaterally Heart: irregularly irregular rhythm Extremities: extremities normal, atraumatic, no cyanosis or edema Pulses: 2+ and symmetric Skin: Skin color, texture, turgor normal. No rashes or lesions Neurologic: Alert and oriented X 3, normal strength and tone. Normal symmetric reflexes. Normal coordination and gait  EKG not performed today  ASSESSMENT AND PLAN:   Essential hypertension History of essential hypertension with blood pressure measured today 107/63.  He is on amlodipine, carvedilol.  Continue current meds at current dosing.  Chronic atrial fibrillation History of chronic A. fib rate controlled on Coumadin anticoagulation.  CAD S/P percutaneous coronary angioplasty  History of CAD status post LAD PCI and drug-eluting stenting by myself 07/31/2016.  The time his EF was 25 to 35% by visual estimation.  This was done after an abnormal Myoview stress test and symptoms of dyspnea.  He was referred initially by Dr. Claudie Leach.  He currently denies chest pain or shortness of breath.  He is on clopidogrel.  Dyslipidemia, goal LDL below 70 History of dyslipidemia on statin therapy.  We will check a lipid liver profile today  Acute on chronic diastolic CHF (congestive heart failure) ([facility]) Patient was admitted for 2 days and August for shortness of breath and was found at the end diastolic heart failure.  His EF by 2D echo performed 05/30/2019 was normal.  Diastolic function could not be evaluated.  He was diuresed and sent home on twice daily Lasix for a week and transition  to daily Lasix.  He did admit to dietary indiscretion prior to admission and currently is watching his salt intake.  He feels clinically improved.      Lorretta Harp MD FACP,FACC,FAHA, [facility] 08/23/2019 8:40 AM

## 2019-08-23 NOTE — Assessment & Plan Note (Signed)
History of dyslipidemia on statin therapy.  We will check a lipid liver profile today

## 2019-08-23 NOTE — Assessment & Plan Note (Signed)
History of essential hypertension with blood pressure measured today 107/63.  He is on amlodipine, carvedilol.  Continue current meds at current dosing.

## 2019-08-23 NOTE — Assessment & Plan Note (Signed)
History of chronic A. fib rate controlled on Coumadin anticoagulation. 

## 2019-08-24 ENCOUNTER — Encounter: Payer: Self-pay | Admitting: *Deleted

## 2019-08-30 ENCOUNTER — Other Ambulatory Visit: Payer: Self-pay | Admitting: Hematology

## 2019-08-30 DIAGNOSIS — C9000 Multiple myeloma not having achieved remission: Secondary | ICD-10-CM

## 2019-09-01 MED FILL — NINLARO 3 MG CAPSULE: 3 | 28 days supply | Qty: 3 | Fill #1

## 2019-09-20 ENCOUNTER — Ambulatory Visit: Payer: Medicare HMO | Admitting: Orthopaedic Surgery

## 2019-09-26 ENCOUNTER — Inpatient Hospital Stay (HOSPITAL_BASED_OUTPATIENT_CLINIC_OR_DEPARTMENT_OTHER): Payer: Medicare HMO | Admitting: Hematology

## 2019-09-26 ENCOUNTER — Inpatient Hospital Stay: Payer: Medicare HMO | Attending: Hematology

## 2019-09-26 ENCOUNTER — Other Ambulatory Visit: Payer: Self-pay | Admitting: Hematology

## 2019-09-26 ENCOUNTER — Inpatient Hospital Stay: Payer: Medicare HMO

## 2019-09-26 ENCOUNTER — Other Ambulatory Visit: Payer: Self-pay

## 2019-09-26 VITALS — BP 109/66 | HR 106 | Temp 98.3°F | Resp 17 | Ht 70.0 in | Wt 233.2 lb

## 2019-09-26 DIAGNOSIS — Z7951 Long term (current) use of inhaled steroids: Secondary | ICD-10-CM | POA: Insufficient documentation

## 2019-09-26 DIAGNOSIS — M1990 Unspecified osteoarthritis, unspecified site: Secondary | ICD-10-CM | POA: Insufficient documentation

## 2019-09-26 DIAGNOSIS — M549 Dorsalgia, unspecified: Secondary | ICD-10-CM | POA: Diagnosis not present

## 2019-09-26 DIAGNOSIS — K219 Gastro-esophageal reflux disease without esophagitis: Secondary | ICD-10-CM | POA: Insufficient documentation

## 2019-09-26 DIAGNOSIS — I10 Essential (primary) hypertension: Secondary | ICD-10-CM | POA: Insufficient documentation

## 2019-09-26 DIAGNOSIS — G4730 Sleep apnea, unspecified: Secondary | ICD-10-CM | POA: Diagnosis not present

## 2019-09-26 DIAGNOSIS — E538 Deficiency of other specified B group vitamins: Secondary | ICD-10-CM | POA: Insufficient documentation

## 2019-09-26 DIAGNOSIS — Z7901 Long term (current) use of anticoagulants: Secondary | ICD-10-CM | POA: Diagnosis not present

## 2019-09-26 DIAGNOSIS — E7800 Pure hypercholesterolemia, unspecified: Secondary | ICD-10-CM | POA: Insufficient documentation

## 2019-09-26 DIAGNOSIS — I2510 Atherosclerotic heart disease of native coronary artery without angina pectoris: Secondary | ICD-10-CM | POA: Diagnosis not present

## 2019-09-26 DIAGNOSIS — I255 Ischemic cardiomyopathy: Secondary | ICD-10-CM | POA: Diagnosis not present

## 2019-09-26 DIAGNOSIS — C9000 Multiple myeloma not having achieved remission: Secondary | ICD-10-CM

## 2019-09-26 DIAGNOSIS — Z79899 Other long term (current) drug therapy: Secondary | ICD-10-CM | POA: Diagnosis not present

## 2019-09-26 DIAGNOSIS — D649 Anemia, unspecified: Secondary | ICD-10-CM | POA: Diagnosis not present

## 2019-09-26 DIAGNOSIS — I4891 Unspecified atrial fibrillation: Secondary | ICD-10-CM | POA: Insufficient documentation

## 2019-09-26 DIAGNOSIS — D696 Thrombocytopenia, unspecified: Secondary | ICD-10-CM

## 2019-09-26 DIAGNOSIS — Z7902 Long term (current) use of antithrombotics/antiplatelets: Secondary | ICD-10-CM | POA: Insufficient documentation

## 2019-09-26 DIAGNOSIS — R197 Diarrhea, unspecified: Secondary | ICD-10-CM | POA: Diagnosis not present

## 2019-09-26 DIAGNOSIS — Z7189 Other specified counseling: Secondary | ICD-10-CM

## 2019-09-26 DIAGNOSIS — D6959 Other secondary thrombocytopenia: Secondary | ICD-10-CM | POA: Diagnosis not present

## 2019-09-26 DIAGNOSIS — E1140 Type 2 diabetes mellitus with diabetic neuropathy, unspecified: Secondary | ICD-10-CM | POA: Diagnosis not present

## 2019-09-26 LAB — CBC WITH DIFFERENTIAL/PLATELET
Abs Immature Granulocytes: 0.04 10*3/uL (ref 0.00–0.07)
Basophils Absolute: 0 10*3/uL (ref 0.0–0.1)
Basophils Relative: 1 %
Eosinophils Absolute: 0.1 10*3/uL (ref 0.0–0.5)
Eosinophils Relative: 3 %
HCT: 38.5 % — ABNORMAL LOW (ref 39.0–52.0)
Hemoglobin: 11.7 g/dL — ABNORMAL LOW (ref 13.0–17.0)
Immature Granulocytes: 1 %
Lymphocytes Relative: 11 %
Lymphs Abs: 0.6 10*3/uL — ABNORMAL LOW (ref 0.7–4.0)
MCH: 28.1 pg (ref 26.0–34.0)
MCHC: 30.4 g/dL (ref 30.0–36.0)
MCV: 92.5 fL (ref 80.0–100.0)
Monocytes Absolute: 0.5 10*3/uL (ref 0.1–1.0)
Monocytes Relative: 10 %
Neutro Abs: 4 10*3/uL (ref 1.7–7.7)
Neutrophils Relative %: 74 %
Platelets: 94 10*3/uL — ABNORMAL LOW (ref 150–400)
RBC: 4.16 MIL/uL — ABNORMAL LOW (ref 4.22–5.81)
RDW: 17 % — ABNORMAL HIGH (ref 11.5–15.5)
WBC: 5.3 10*3/uL (ref 4.0–10.5)
nRBC: 0 % (ref 0.0–0.2)

## 2019-09-26 LAB — CMP (CANCER CENTER ONLY)
ALT: 26 U/L (ref 0–44)
AST: 30 U/L (ref 15–41)
Albumin: 3.6 g/dL (ref 3.5–5.0)
Alkaline Phosphatase: 87 U/L (ref 38–126)
Anion gap: 8 (ref 5–15)
BUN: 29 mg/dL — ABNORMAL HIGH (ref 8–23)
CO2: 26 mmol/L (ref 22–32)
Calcium: 8.8 mg/dL — ABNORMAL LOW (ref 8.9–10.3)
Chloride: 110 mmol/L (ref 98–111)
Creatinine: 1.48 mg/dL — ABNORMAL HIGH (ref 0.61–1.24)
GFR, Est AFR Am: 54 mL/min — ABNORMAL LOW (ref >60)
GFR, Estimated: 47 mL/min — ABNORMAL LOW (ref >60)
Glucose, Bld: 126 mg/dL — ABNORMAL HIGH (ref 70–99)
Potassium: 4.2 mmol/L (ref 3.5–5.1)
Sodium: 144 mmol/L (ref 135–145)
Total Bilirubin: 0.8 mg/dL (ref 0.3–1.2)
Total Protein: 6.7 g/dL (ref 6.5–8.1)

## 2019-09-26 MED ORDER — SODIUM CHLORIDE 0.9 % IV SOLN
60.0000 mg | Freq: Once | INTRAVENOUS | Status: AC
  Administered 2019-09-26: 60 mg via INTRAVENOUS
  Filled 2019-09-26: qty 10

## 2019-09-26 MED ORDER — SODIUM CHLORIDE 0.9 % IV SOLN
INTRAVENOUS | Status: DC
  Administered 2019-09-26: 11:00:00 via INTRAVENOUS
  Filled 2019-09-26: qty 250

## 2019-09-26 NOTE — Patient Instructions (Signed)
Pamidronate injection What is this medicine? PAMIDRONATE (pa mi DROE nate) slows calcium loss from bones. It is used to treat high calcium blood levels from cancer or Paget's disease. It is also used to treat bone pain and prevent fractures from certain cancers that have spread to the bone. This medicine may be used for other purposes; ask your health care provider or pharmacist if you have questions. COMMON BRAND NAME(S): Aredia What should I tell my health care provider before I take this medicine? They need to know if you have any of these conditions:  aspirin-sensitive asthma  dental disease  kidney disease  an unusual or allergic reaction to pamidronate, other medicines, foods, dyes, or preservatives  pregnant or trying to get pregnant  breast-feeding How should I use this medicine? This medicine is for infusion into a vein. It is given by a health care professional in a hospital or clinic setting. Talk to your pediatrician regarding the use of this medicine in children. This medicine is not approved for use in children. Overdosage: If you think you have taken too much of this medicine contact a poison control center or emergency room at once. NOTE: This medicine is only for you. Do not share this medicine with others. What if I miss a dose? This does not apply. What may interact with this medicine?  certain antibiotics given by injection  medicines for inflammation or pain like ibuprofen, naproxen  some diuretics like bumetanide, furosemide  cyclosporine  parathyroid hormone  tacrolimus  teriparatide  thalidomide This list may not describe all possible interactions. Give your health care provider a list of all the medicines, herbs, non-prescription drugs, or dietary supplements you use. Also tell them if you smoke, drink alcohol, or use illegal drugs. Some items may interact with your medicine. What should I watch for while using this medicine? Visit your doctor or  health care professional for regular checkups. It may be some time before you see the benefit from this medicine. Do not stop taking your medicine unless your doctor tells you to. Your doctor may order blood tests or other tests to see how you are doing. Women should inform their doctor if they wish to become pregnant or think they might be pregnant. There is a potential for serious side effects to an unborn child. Talk to your health care professional or pharmacist for more information. You should make sure that you get enough calcium and vitamin D while you are taking this medicine. Discuss the foods you eat and the vitamins you take with your health care professional. Some people who take this medicine have severe bone, joint, and/or muscle pain. This medicine may also increase your risk for a broken thigh bone. Tell your doctor right away if you have pain in your upper leg or groin. Tell your doctor if you have any pain that does not go away or that gets worse. What side effects may I notice from receiving this medicine? Side effects that you should report to your doctor or health care professional as soon as possible:  allergic reactions like skin rash, itching or hives, swelling of the face, lips, or tongue  black or tarry stools  changes in vision  eye inflammation, pain  high blood pressure  jaw pain, especially burning or cramping  muscle weakness  numb, tingling pain  swelling of feet or hands  trouble passing urine or change in the amount of urine  unable to move easily Side effects that usually do not  require medical attention (report to your doctor or health care professional if they continue or are bothersome):  bone, joint, or muscle pain  constipation  dizzy, drowsy  fever  headache  loss of appetite  nausea, vomiting  pain at site where injected This list may not describe all possible side effects. Call your doctor for medical advice about side effects.  You may report side effects to FDA at 1-800-FDA-1088. Where should I keep my medicine? This drug is given in a hospital or clinic and will not be stored at home. NOTE: This sheet is a summary. It may not cover all possible information. If you have questions about this medicine, talk to your doctor, pharmacist, or health care provider.  2020 Elsevier/Gold Standard (2011-04-04 08:49:49)  

## 2019-09-26 NOTE — Progress Notes (Signed)
[name]    HEMATOLOGY/ONCOLOGY CLINIC NOTE  Date of Service: 09/26/19    Patient Care Team: Sandi Mariscal, MD as PCP - General (Internal Medicine) Lorretta Harp, MD as PCP - Cardiology (Cardiology)  CHIEF COMPLAINTS/PURPOSE OF CONSULTATION:   F/u for continued management of Myeloma   HISTORY OF PRESENTING ILLNESS:  plz see previous note for details on HPI  INTERVAL HISTORY  Richard Vang is here for his scheduled followup for multiple myeloma. The patient's last visit with [name] was on 07/25/2019. The pt reports that he is doing well overall.  The pt reports that he has been good in the interim and has had no new concerns. He is still experiencing relatively similar levels of back pain and numbness/tingling in his hands and feet. Pt reports that he walks a great deal around his home daily. He has continued to take his Ninlaro as prescribed and his Vitamin D as recommended. He visited his Cardiologist last month and there were no new concerns. He is interested in having several teeth removed and having dentures placed in the near future. Pt has experienced diarrhea in the interim, up to 4 weeks at a time. It has since resolved.   Lab results today (09/26/19) of CBC w/diff and CMP is as follows: all values are WNL except for RBC at 4.16, Hgb at 11.7, HCT at 38.5, RDW at 17.0, PLTs at 94K, Lymphs Abs at 0.6K, Glucose at 126, BUN at 29, Creatinine at 1.48, Calcium at 8.8, GFR Est Non Af Am at 47. 09/26/2019 MMP is in progress  09/26/2019 K/L light chains is in progress   On review of systems, pt reports back pain, neuropathy in hands/feet, resolved diarrhea and denies leg swelling, dental concerns, bowel movement issues, unexpected weight loss and any other symptoms.    MEDICAL HISTORY:  Past Medical History:  Diagnosis Date  . Arthritis    "left shoulder" (07/31/2016)  . Coronary artery disease   . GERD (gastroesophageal reflux disease)   . Heart murmur   . High cholesterol   . Hypertension    . Multiple myeloma ([place])   . Sleep apnea    "probably; having test in November" (07/31/2016)  . Type II diabetes mellitus ([place])     SURGICAL HISTORY: Past Surgical History:  Procedure Laterality Date  . CARDIAC CATHETERIZATION N/A 07/31/2016   Procedure: Left Heart Cath and Coronary Angiography;  Surgeon: Lorretta Harp, MD;  Location: [facility];  Service: Cardiovascular;  Laterality: N/A;  . CARDIAC CATHETERIZATION N/A 07/31/2016   Procedure: Coronary Stent Intervention;  Surgeon: Lorretta Harp, MD;  Location: [facility];  Service: Cardiovascular;  Laterality: N/A;  . CORONARY ANGIOPLASTY    . OPEN REDUCTION INTERNAL FIXATION (ORIF) METACARPAL Left 05/21/2018   Procedure: OPEN REDUCTION INTERNAL FIXATION (ORIF) 2ND METACARPAL;  Surgeon: Shona Needles, MD;  Location: [facility];  Service: Orthopedics;  Laterality: Left;  . ORIF CLAVICULAR FRACTURE Right 05/21/2018   Procedure: OPEN REDUCTION INTERNAL FIXATION (ORIF) CLAVICULAR FRACTURE;  Surgeon: Shona Needles, MD;  Location: [facility];  Service: Orthopedics;  Laterality: Right;  . SHOULDER SURGERY Left 1973   "put pin in it where it had separated"   . TONSILLECTOMY  ~ 1956    SOCIAL HISTORY: Social History   Socioeconomic History  . Marital status: Divorced    Spouse name: Not on file  . Number of children: Not on file  . Years of education: Not on file  . Highest education  level: Not on file  Occupational History  . Not on file  Social Needs  . Financial resource strain: Not on file  . Food insecurity    Worry: Not on file    Inability: Not on file  . Transportation needs    Medical: Not on file    Non-medical: Not on file  Tobacco Use  . Smoking status: Never Smoker  . Smokeless tobacco: Never Used  Substance and Sexual Activity  . Alcohol use: Yes    Comment: 07/31/2016 "nothing since 2002"  . Drug use: No  . Sexual activity: Not Currently  Lifestyle  . Physical activity    Days per week: Not on  file    Minutes per session: Not on file  . Stress: Not on file  Relationships  . Social Herbalist on phone: Not on file    Gets together: Not on file    Attends religious service: Not on file    Active member of club or organization: Not on file    Attends meetings of clubs or organizations: Not on file    Relationship status: Not on file  . Intimate partner violence    Fear of current or ex partner: Not on file    Emotionally abused: Not on file    Physically abused: Not on file    Forced sexual activity: Not on file  Other Topics Concern  . Not on file  Social History Narrative  . Not on file    FAMILY HISTORY: Family History  Problem Relation Age of Onset  . Hypertension Other     ALLERGIES:  has No Known Allergies.  MEDICATIONS:  Current Outpatient Medications  Medication Sig Dispense Refill  . acyclovir (ZOVIRAX) 400 MG tablet Take 1 tablet by mouth twice daily 60 tablet 0  . amLODipine (NORVASC) 5 MG tablet Take 5 mg by mouth daily.    [name] atorvastatin (LIPITOR) 40 MG tablet Take 1 tablet (40 mg total) by mouth at bedtime. 30 tablet 12  . carvedilol (COREG) 12.5 MG tablet Take 12.5 mg by mouth 2 (two) times daily with a meal.    . clopidogrel (PLAVIX) 75 MG tablet Take 1 tablet (75 mg total) by mouth daily with breakfast. 30 tablet 12  . cyclobenzaprine (FLEXERIL) 10 MG tablet Take 10 mg by mouth daily as needed for muscle spasms.   2  . escitalopram (LEXAPRO) 10 MG tablet Take 10 mg by mouth daily.    . furosemide (LASIX) 40 MG tablet Take 40 mg by mouth.    . montelukast (SINGULAIR) 10 MG tablet Take 10 mg by mouth daily.    [name] omeprazole (PRILOSEC) 40 MG capsule Take 40 mg by mouth daily with breakfast.     . oxyCODONE-acetaminophen (PERCOCET) 10-325 MG tablet Take 1 tablet by mouth 2 (two) times daily. 10 tablet 0  . Potassium Chloride CR (MICRO-K) 8 MEQ CPCR capsule CR Take 8 mEq by mouth daily.    . SYMBICORT 160-4.5 MCG/ACT inhaler Inhale 2 puffs  into the lungs 2 (two) times daily as needed (shortness of breath).     . Vitamin D, Ergocalciferol, (DRISDOL) 1.25 MG (50000 UT) CAPS capsule Take 1 capsule by mouth once a week 12 capsule 0  . warfarin (COUMADIN) 5 MG tablet Take 5 mg by mouth daily.    [name] NINLARO 3 MG capsule TAKE 1 CAPSULE (3 MG TOTAL) BY MOUTH ONCE A WEEK. ON DAY 1,8,15 EVERY 28 DAYS. TAKE ON AN  EMPTY STOMACH 1HR BEFORE OR 2HRS AFTER FOOD. 3 capsule 1   No current facility-administered medications for this visit.    Facility-Administered Medications Ordered in Other Visits  Medication Dose Route Frequency Provider Last Rate Last Dose  . 0.9 %  sodium chloride infusion   Intravenous Continuous Brunetta Genera, MD 10 mL/hr at 07/09/17 1423    . 0.9 %  sodium chloride infusion   Intravenous Continuous Brunetta Genera, MD   Stopped at 09/26/19 1531    REVIEW OF SYSTEMS:   A 10+ POINT REVIEW OF SYSTEMS WAS OBTAINED including neurology, dermatology, psychiatry, cardiac, respiratory, lymph, extremities, GI, GU, Musculoskeletal, constitutional, breasts, reproductive, HEENT.  All pertinent positives are noted in the HPI.  All others are negative.   PHYSICAL EXAMINATION:  ECOG PERFORMANCE STATUS: 2 .BP 109/66 (BP Location: Left Arm, Patient Position: Sitting)   Pulse (!) 106   Temp 98.3 F (36.8 C) (Temporal)   Resp 17   Ht '5\' 10"'  (1.778 m)   Wt 233 lb 3.2 oz (105.8 kg)   SpO2 100%   BMI 33.46 kg/m   Exam was given in a chair   GENERAL:alert, in no acute distress and comfortable SKIN: no acute rashes, no significant lesions EYES: conjunctiva are pink and non-injected, sclera anicteric OROPHARYNX: MMM, no exudates, no oropharyngeal erythema or ulceration NECK: supple, no JVD LYMPH:  no palpable lymphadenopathy in the cervical, axillary or inguinal regions LUNGS: clear to auscultation b/l with normal respiratory effort HEART: regular rate & rhythm ABDOMEN:  normoactive bowel sounds , non tender, not  distended. No palpable hepatosplenomegaly.  Extremity: no pedal edema PSYCH: alert & oriented x 3 with fluent speech NEURO: no focal motor/sensory deficits  LABORATORY DATA:  I have reviewed the data as listed . CBC Latest Ref Rng & Units 09/26/2019 07/25/2019 05/30/2019  WBC 4.0 - 10.5 K/uL 5.3 6.6 5.6  Hemoglobin 13.0 - 17.0 g/dL 11.7(L) 12.0(L) 11.6(L)  Hematocrit 39.0 - 52.0 % 38.5(L) 38.8(L) 39.1  Platelets 150 - 400 K/uL 94(L) 85(L) 80(L)   . CBC    Component Value Date/Time   WBC 5.3 09/26/2019 0804   RBC 4.16 (L) 09/26/2019 0804   HGB 11.7 (L) 09/26/2019 0804   HGB 10.5 (L) 02/25/2018 1219   HGB 9.2 (L) 10/01/2017 1112   HCT 38.5 (L) 09/26/2019 0804   HCT 29.3 (L) 10/01/2017 1112   PLT 94 (L) 09/26/2019 0804   PLT 132 (L) 02/25/2018 1219   PLT 170 10/01/2017 1112   MCV 92.5 09/26/2019 0804   MCV 88.5 10/01/2017 1112   MCH 28.1 09/26/2019 0804   MCHC 30.4 09/26/2019 0804   RDW 17.0 (H) 09/26/2019 0804   RDW 15.3 (H) 10/01/2017 1112   LYMPHSABS 0.6 (L) 09/26/2019 0804   LYMPHSABS 0.5 (L) 10/01/2017 1112   MONOABS 0.5 09/26/2019 0804   MONOABS 0.5 10/01/2017 1112   EOSABS 0.1 09/26/2019 0804   EOSABS 0.1 10/01/2017 1112   BASOSABS 0.0 09/26/2019 0804   BASOSABS 0.0 10/01/2017 1112    . CMP Latest Ref Rng & Units 09/26/2019 08/23/2019 07/25/2019  Glucose 70 - 99 mg/dL 126(H) - 130(H)  BUN 8 - 23 mg/dL 29(H) - 40(H)  Creatinine 0.61 - 1.24 mg/dL 1.48(H) - 1.57(H)  Sodium 135 - 145 mmol/L 144 - 142  Potassium 3.5 - 5.1 mmol/L 4.2 - 4.0  Chloride 98 - 111 mmol/L 110 - 110  CO2 22 - 32 mmol/L 26 - 24  Calcium 8.9 - 10.3 mg/dL 8.8(L) -  9.0  Total Protein 6.5 - 8.1 g/dL 6.7 6.5 6.4(L)  Total Bilirubin 0.3 - 1.2 mg/dL 0.8 0.7 0.8  Alkaline Phos 38 - 126 U/L 87 95 81  AST 15 - 41 U/L '30 27 25  ' ALT 0 - 44 U/L '26 17 21   ' 03/18/2019 MMP: Component     Latest Ref Rng & Units 03/18/2019  IgG (Immunoglobin G), Serum     603 - 1,613 mg/dL 1,012  IgA     61 - 437 mg/dL  51 (L)  IgM (Immunoglobulin M), Srm     15 - 143 mg/dL 27  Total Protein ELP     6.0 - 8.5 g/dL 6.3  Albumin SerPl Elph-Mcnc     2.9 - 4.4 g/dL 3.8  Alpha 1     0.0 - 0.4 g/dL 0.2  Alpha2 Glob SerPl Elph-Mcnc     0.4 - 1.0 g/dL 0.7  B-Globulin SerPl Elph-Mcnc     0.7 - 1.3 g/dL 0.8  Gamma Glob SerPl Elph-Mcnc     0.4 - 1.8 g/dL 0.8  M Protein SerPl Elph-Mcnc     Not Observed g/dL Not Observed  Globulin, Total     2.2 - 3.9 g/dL 2.5  Albumin/Glob SerPl     0.7 - 1.7 1.6  IFE 1      Comment  Please Note (HCV):      Comment    RADIOGRAPHIC STUDIES: I have personally reviewed the radiological images as listed and agreed with the findings in the report. No results found.  ASSESSMENT & PLAN:   73 y.o. 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  K/L 95, No M spike on SPEP -- suggests light chain MM Cytogenetics - normal male chromosomes FISH- +11 and 13q-/-13  Patient is s/p 1 cycle of Vd and Serum free kappa LC has decreased from 700 to 203.6 with improvement in his K/L ratio from 94.59 to 29. Completed 2nd cycle of treatment with Vd + Cytoxan (219m/m2) Completed 3rd cycle of treatment with Vd + Cytoxan (2070mm2) - cytoxan held D15 due to thrombocytopenia Completed 4th of VCd Discontinued VCd after C5D8 due to intolerance with diarrhea and increasing neuropathy and fatigue with borderline functional status. -Patient did not tolerate maintenance Revlimid 10  mg by mouth daily due to grade 2-3 diarrhea. This was discontinued and his diarrhea has resolved.  Currently on maintenance Ninlaro M spike absent   11/15/18 CT Pelvic revealed  3 mm stone in the right UVJ. 5 x 6 mm stone is identified in the left UVJ. No associated distal hydroureter. 2. Stable lucent lesion in the right iliac bone. 3. Small right groin hernia contains only fat. 11/15/18 CT Lumbar revealed  No acute fracture or malalignment. 2. Severe osteopenia and multiple old compression fractures, severe at T11 and T12. 3. Severe canal stenosis L3-4. Multilevel neural foraminal narrowing: Moderate to severe at T12-L1. 4. Slowly enlarging solid LEFT renal mass. Recommend non emergent MRI renal mass protocol on non emergent basis -No obvious new myeloma involvement seen in most recent CT imaging.  PLAN:  -Discussed pt labwork today, 09/26/19; blood counts look good, blood counts are stable -Discussed 09/26/2019 K/L light chains and MMP are in progress  -Discussed 07/25/2019 K/L light chains shows stable Kappa free light chain at 50.9 -Discussed 07/25/2019 MMP continues to show no M protein -The pt has no prohibitive toxicities from continuing 50m Ninlaro on D1, D8, and D15 at this time. 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 -Advised pt to inform u[place]efore he has any dental procedures so that we can hold Aredia  -Continue Vitamin D replacement weekly -Continue Aredia every 8 weeks -Continue B12 replacement -Continue acyclovir for shingles prophylaxis. -Continue aspirin -Will see back in 2 months with labs           2) CAD s/p prox LAD DES PCI on 07/31/2016 with ischemic cardiomyopathy ejection fraction of 25-35%. Rpt ECHO on 10/03/2016 shows improvement in EF to 55-60% with no RWMABN.  3) Macrocytic Anemia with moderate thrombocytopenia - due to MM and and Ninlaro. Stable.  4) Thrombocytopenia - due to MM and treatment --improved today 108k. --will monitor  5) B12  deficiency - Plan -cont SL B12 1005m po daily  6) h/o Afib with RVR on coumadin Some bradycardia with BB -continue mx per PCP and cardiology  7) Back pain -- multiple chronic compression fractures with some worsening -On bisphosphonates already -Ergocalciferol 50k units weekly -Advised that the pt not lift anything heavy  -Discussed the 11/15/18 CT imaging, as noted above, did refer pt to orthopedist -Pt will be following up with orthopedist in December 2020  FOLLOW UP: -Plz schedule next dose of Pamidronate, labs and MD visit in 2 months.  The total time spent in the appt was 20 minutes and more than 50% was on counseling and direct patient cares.  All of the patient's questions were answered with apparent satisfaction. The patient knows to call the clinic with any problems, questions or concerns.  GaSullivan LoneD MSArrowsmithAHIVMS SCSouth Jersey Health Care CenterTLos Angeles Community Hospitalematology/Oncology Physician CoSaratoga Schenectady [facility] LLC(Office):       [phone] cell):  [phone]):           [phone], JaYevette Edwardsam acting as a scribe for Dr. GaSullivan Lone  .I have reviewed the above documentation for accuracy and completeness, and I agree with the above. .GBrunetta GeneraD

## 2019-09-27 ENCOUNTER — Telehealth: Payer: Self-pay | Admitting: Hematology

## 2019-09-27 LAB — KAPPA/LAMBDA LIGHT CHAINS
Kappa free light chain: 58.7 mg/L — ABNORMAL HIGH (ref 3.3–19.4)
Kappa, lambda light chain ratio: 2.91 — ABNORMAL HIGH (ref 0.26–1.65)
Lambda free light chains: 20.2 mg/L (ref 5.7–26.3)

## 2019-09-27 NOTE — Telephone Encounter (Signed)
Scheduled appt per 12/7 los.  Spoke with pt and his wife and they are aware of the appt date and time.

## 2019-09-28 LAB — MULTIPLE MYELOMA PANEL, SERUM
Albumin SerPl Elph-Mcnc: 3.5 g/dL (ref 2.9–4.4)
Albumin/Glob SerPl: 1.3 (ref 0.7–1.7)
Alpha 1: 0.2 g/dL (ref 0.0–0.4)
Alpha2 Glob SerPl Elph-Mcnc: 0.6 g/dL (ref 0.4–1.0)
B-Globulin SerPl Elph-Mcnc: 0.8 g/dL (ref 0.7–1.3)
Gamma Glob SerPl Elph-Mcnc: 1 g/dL (ref 0.4–1.8)
Globulin, Total: 2.7 g/dL (ref 2.2–3.9)
IgA: 80 mg/dL (ref 61–437)
IgG (Immunoglobin G), Serum: 1065 mg/dL (ref 603–1613)
IgM (Immunoglobulin M), Srm: 73 mg/dL (ref 15–143)
Total Protein ELP: 6.2 g/dL (ref 6.0–8.5)

## 2019-09-29 MED FILL — NINLARO 3 MG CAPSULE: 3 | 28 days supply | Qty: 3 | Fill #0

## 2019-10-02 ENCOUNTER — Other Ambulatory Visit: Payer: Self-pay | Admitting: Hematology

## 2019-10-02 DIAGNOSIS — C9000 Multiple myeloma not having achieved remission: Secondary | ICD-10-CM

## 2019-10-26 MED FILL — NINLARO 3 MG CAPS: 3 | 28 days supply | Qty: 3 | Fill #1

## 2019-10-30 ENCOUNTER — Other Ambulatory Visit: Payer: Self-pay | Admitting: Hematology

## 2019-10-30 DIAGNOSIS — C9000 Multiple myeloma not having achieved remission: Secondary | ICD-10-CM

## 2019-11-23 ENCOUNTER — Other Ambulatory Visit: Payer: Self-pay | Admitting: Hematology

## 2019-11-23 DIAGNOSIS — C9000 Multiple myeloma not having achieved remission: Secondary | ICD-10-CM

## 2019-11-24 ENCOUNTER — Other Ambulatory Visit: Payer: Self-pay | Admitting: *Deleted

## 2019-11-24 DIAGNOSIS — C9000 Multiple myeloma not having achieved remission: Secondary | ICD-10-CM

## 2019-11-24 MED FILL — NINLARO 3 MG CAPS: 3 | 28 days supply | Qty: 3 | Fill #0

## 2019-11-25 ENCOUNTER — Other Ambulatory Visit: Payer: Self-pay

## 2019-11-25 ENCOUNTER — Inpatient Hospital Stay (HOSPITAL_BASED_OUTPATIENT_CLINIC_OR_DEPARTMENT_OTHER): Payer: Medicare HMO | Admitting: Hematology

## 2019-11-25 ENCOUNTER — Inpatient Hospital Stay: Payer: Medicare HMO | Attending: Hematology

## 2019-11-25 ENCOUNTER — Inpatient Hospital Stay: Payer: Medicare HMO

## 2019-11-25 VITALS — BP 112/82 | HR 54 | Temp 98.2°F | Resp 18 | Ht 70.0 in | Wt 231.8 lb

## 2019-11-25 DIAGNOSIS — M8580 Other specified disorders of bone density and structure, unspecified site: Secondary | ICD-10-CM | POA: Diagnosis not present

## 2019-11-25 DIAGNOSIS — I2510 Atherosclerotic heart disease of native coronary artery without angina pectoris: Secondary | ICD-10-CM | POA: Insufficient documentation

## 2019-11-25 DIAGNOSIS — G4730 Sleep apnea, unspecified: Secondary | ICD-10-CM | POA: Diagnosis not present

## 2019-11-25 DIAGNOSIS — I255 Ischemic cardiomyopathy: Secondary | ICD-10-CM | POA: Diagnosis not present

## 2019-11-25 DIAGNOSIS — Z7951 Long term (current) use of inhaled steroids: Secondary | ICD-10-CM | POA: Diagnosis not present

## 2019-11-25 DIAGNOSIS — R0602 Shortness of breath: Secondary | ICD-10-CM | POA: Insufficient documentation

## 2019-11-25 DIAGNOSIS — Z7901 Long term (current) use of anticoagulants: Secondary | ICD-10-CM | POA: Insufficient documentation

## 2019-11-25 DIAGNOSIS — E538 Deficiency of other specified B group vitamins: Secondary | ICD-10-CM | POA: Diagnosis not present

## 2019-11-25 DIAGNOSIS — K219 Gastro-esophageal reflux disease without esophagitis: Secondary | ICD-10-CM | POA: Diagnosis not present

## 2019-11-25 DIAGNOSIS — E1140 Type 2 diabetes mellitus with diabetic neuropathy, unspecified: Secondary | ICD-10-CM | POA: Diagnosis not present

## 2019-11-25 DIAGNOSIS — E7800 Pure hypercholesterolemia, unspecified: Secondary | ICD-10-CM | POA: Diagnosis not present

## 2019-11-25 DIAGNOSIS — M1990 Unspecified osteoarthritis, unspecified site: Secondary | ICD-10-CM | POA: Diagnosis not present

## 2019-11-25 DIAGNOSIS — C9000 Multiple myeloma not having achieved remission: Secondary | ICD-10-CM

## 2019-11-25 DIAGNOSIS — D6959 Other secondary thrombocytopenia: Secondary | ICD-10-CM | POA: Insufficient documentation

## 2019-11-25 DIAGNOSIS — Z7189 Other specified counseling: Secondary | ICD-10-CM

## 2019-11-25 DIAGNOSIS — Z7902 Long term (current) use of antithrombotics/antiplatelets: Secondary | ICD-10-CM | POA: Insufficient documentation

## 2019-11-25 DIAGNOSIS — R5383 Other fatigue: Secondary | ICD-10-CM | POA: Diagnosis not present

## 2019-11-25 DIAGNOSIS — I10 Essential (primary) hypertension: Secondary | ICD-10-CM | POA: Diagnosis not present

## 2019-11-25 DIAGNOSIS — Z79899 Other long term (current) drug therapy: Secondary | ICD-10-CM | POA: Insufficient documentation

## 2019-11-25 LAB — CMP (CANCER CENTER ONLY)
ALT: 27 U/L (ref 0–44)
AST: 32 U/L (ref 15–41)
Albumin: 3.9 g/dL (ref 3.5–5.0)
Alkaline Phosphatase: 86 U/L (ref 38–126)
Anion gap: 9 (ref 5–15)
BUN: 36 mg/dL — ABNORMAL HIGH (ref 8–23)
CO2: 25 mmol/L (ref 22–32)
Calcium: 9 mg/dL (ref 8.9–10.3)
Chloride: 110 mmol/L (ref 98–111)
Creatinine: 1.49 mg/dL — ABNORMAL HIGH (ref 0.61–1.24)
GFR, Est AFR Am: 54 mL/min — ABNORMAL LOW (ref >60)
GFR, Estimated: 46 mL/min — ABNORMAL LOW (ref >60)
Glucose, Bld: 106 mg/dL — ABNORMAL HIGH (ref 70–99)
Potassium: 4.7 mmol/L (ref 3.5–5.1)
Sodium: 144 mmol/L (ref 135–145)
Total Bilirubin: 0.8 mg/dL (ref 0.3–1.2)
Total Protein: 7.1 g/dL (ref 6.5–8.1)

## 2019-11-25 LAB — CBC WITH DIFFERENTIAL (CANCER CENTER ONLY)
Abs Immature Granulocytes: 0.02 10*3/uL (ref 0.00–0.07)
Basophils Absolute: 0 10*3/uL (ref 0.0–0.1)
Basophils Relative: 0 %
Eosinophils Absolute: 0.1 10*3/uL (ref 0.0–0.5)
Eosinophils Relative: 1 %
HCT: 40.4 % (ref 39.0–52.0)
Hemoglobin: 12.3 g/dL — ABNORMAL LOW (ref 13.0–17.0)
Immature Granulocytes: 0 %
Lymphocytes Relative: 10 %
Lymphs Abs: 0.5 10*3/uL — ABNORMAL LOW (ref 0.7–4.0)
MCH: 27.8 pg (ref 26.0–34.0)
MCHC: 30.4 g/dL (ref 30.0–36.0)
MCV: 91.2 fL (ref 80.0–100.0)
Monocytes Absolute: 0.5 10*3/uL (ref 0.1–1.0)
Monocytes Relative: 8 %
Neutro Abs: 4.3 10*3/uL (ref 1.7–7.7)
Neutrophils Relative %: 81 %
Platelet Count: 122 10*3/uL — ABNORMAL LOW (ref 150–400)
RBC: 4.43 MIL/uL (ref 4.22–5.81)
RDW: 16.1 % — ABNORMAL HIGH (ref 11.5–15.5)
WBC Count: 5.4 10*3/uL (ref 4.0–10.5)
nRBC: 0 % (ref 0.0–0.2)

## 2019-11-25 MED ORDER — SODIUM CHLORIDE 0.9 % IV SOLN
60.0000 mg | Freq: Once | INTRAVENOUS | Status: AC
  Administered 2019-11-25: 12:00:00 60 mg via INTRAVENOUS
  Filled 2019-11-25: qty 10
  Filled 2019-11-25: qty 20

## 2019-11-25 MED ORDER — SODIUM CHLORIDE 0.9 % IV SOLN
INTRAVENOUS | Status: DC
  Filled 2019-11-25: qty 250

## 2019-11-25 NOTE — Patient Instructions (Signed)
Pamidronate injection What is this medicine? PAMIDRONATE (pa mi DROE nate) slows calcium loss from bones. It is used to treat high calcium blood levels from cancer or Paget's disease. It is also used to treat bone pain and prevent fractures from certain cancers that have spread to the bone. This medicine may be used for other purposes; ask your health care provider or pharmacist if you have questions. COMMON BRAND NAME(S): Aredia What should I tell my health care provider before I take this medicine? They need to know if you have any of these conditions:  aspirin-sensitive asthma  dental disease  kidney disease  an unusual or allergic reaction to pamidronate, other medicines, foods, dyes, or preservatives  pregnant or trying to get pregnant  breast-feeding How should I use this medicine? This medicine is for infusion into a vein. It is given by a health care professional in a hospital or clinic setting. Talk to your pediatrician regarding the use of this medicine in children. This medicine is not approved for use in children. Overdosage: If you think you have taken too much of this medicine contact a poison control center or emergency room at once. NOTE: This medicine is only for you. Do not share this medicine with others. What if I miss a dose? This does not apply. What may interact with this medicine?  certain antibiotics given by injection  medicines for inflammation or pain like ibuprofen, naproxen  some diuretics like bumetanide, furosemide  cyclosporine  parathyroid hormone  tacrolimus  teriparatide  thalidomide This list may not describe all possible interactions. Give your health care provider a list of all the medicines, herbs, non-prescription drugs, or dietary supplements you use. Also tell them if you smoke, drink alcohol, or use illegal drugs. Some items may interact with your medicine. What should I watch for while using this medicine? Visit your doctor or  health care professional for regular checkups. It may be some time before you see the benefit from this medicine. Do not stop taking your medicine unless your doctor tells you to. Your doctor may order blood tests or other tests to see how you are doing. Women should inform their doctor if they wish to become pregnant or think they might be pregnant. There is a potential for serious side effects to an unborn child. Talk to your health care professional or pharmacist for more information. You should make sure that you get enough calcium and vitamin D while you are taking this medicine. Discuss the foods you eat and the vitamins you take with your health care professional. Some people who take this medicine have severe bone, joint, and/or muscle pain. This medicine may also increase your risk for a broken thigh bone. Tell your doctor right away if you have pain in your upper leg or groin. Tell your doctor if you have any pain that does not go away or that gets worse. What side effects may I notice from receiving this medicine? Side effects that you should report to your doctor or health care professional as soon as possible:  allergic reactions like skin rash, itching or hives, swelling of the face, lips, or tongue  black or tarry stools  changes in vision  eye inflammation, pain  high blood pressure  jaw pain, especially burning or cramping  muscle weakness  numb, tingling pain  swelling of feet or hands  trouble passing urine or change in the amount of urine  unable to move easily Side effects that usually do not  require medical attention (report to your doctor or health care professional if they continue or are bothersome):  bone, joint, or muscle pain  constipation  dizzy, drowsy  fever  headache  loss of appetite  nausea, vomiting  pain at site where injected This list may not describe all possible side effects. Call your doctor for medical advice about side effects.  You may report side effects to FDA at 1-800-FDA-1088. Where should I keep my medicine? This drug is given in a hospital or clinic and will not be stored at home. NOTE: This sheet is a summary. It may not cover all possible information. If you have questions about this medicine, talk to your doctor, pharmacist, or health care provider.  2020 Elsevier/Gold Standard (2011-04-04 08:49:49)  

## 2019-11-25 NOTE — Progress Notes (Signed)
Richard Vang    HEMATOLOGY/ONCOLOGY CLINIC NOTE  Date of Service: 11/25/19    Patient Care Team: Richard Mariscal, MD as PCP - General (Internal Medicine) Richard Harp, MD as PCP - Cardiology (Cardiology)  CHIEF COMPLAINTS/PURPOSE OF CONSULTATION:   F/u for continued management of Myeloma   HISTORY OF PRESENTING ILLNESS:  plz see previous note for details on HPI  INTERVAL HISTORY   Richard Vang is here for his scheduled followup for multiple myeloma. The patient's last visit with [name] was on 09/26/2019. The pt reports that he is doing well overall.  The pt reports some shortness of breath and some fatigue.   When he walks too far he experiences difficulty breathing  Neuropathy stable. No new prohibitive toxicities from Ninlaro.  Lab results today (11/25/19) of CBC w/diff and CMP is as follows: all values are WNL except for Glucose Bld at 106, BUN at 36, Creatinine at 1.49, GFR Est Non Af Am at 46, GFR Est AFR Am at 54, Hemoglobin at 12.3, RDW at 16.1, Platelet Count at 122, Lymphs Abs at 0.5.  On review of systems, pt denies falls, fevers, chills, infection issues,leg swelling, changes in neuropathy and any other symptoms.     MEDICAL HISTORY:  Past Medical History:  Diagnosis Date  . Arthritis    "left shoulder" (07/31/2016)  . Coronary artery disease   . GERD (gastroesophageal reflux disease)   . Heart murmur   . High cholesterol   . Hypertension   . Multiple myeloma ([place])   . Sleep apnea    "probably; having test in November" (07/31/2016)  . Type II diabetes mellitus ([place])     SURGICAL HISTORY: Past Surgical History:  Procedure Laterality Date  . CARDIAC CATHETERIZATION N/A 07/31/2016   Procedure: Left Heart Cath and Coronary Angiography;  Surgeon: Richard Harp, MD;  Location: [place] [facility];  Service: Cardiovascular;  Laterality: N/A;  . CARDIAC CATHETERIZATION N/A 07/31/2016   Procedure: Coronary Stent Intervention;  Surgeon: Richard Harp, MD;  Location: [place] [facility];  Service: Cardiovascular;  Laterality: N/A;  . CORONARY ANGIOPLASTY    . OPEN REDUCTION INTERNAL FIXATION (ORIF) METACARPAL Left 05/21/2018   Procedure: OPEN REDUCTION INTERNAL FIXATION (ORIF) 2ND METACARPAL;  Surgeon: Shona Needles, MD;  Location: [place];  Service: Orthopedics;  Laterality: Left;  . ORIF CLAVICULAR FRACTURE Right 05/21/2018   Procedure: OPEN REDUCTION INTERNAL FIXATION (ORIF) CLAVICULAR FRACTURE;  Surgeon: Shona Needles, MD;  Location: [place];  Service: Orthopedics;  Laterality: Right;  . SHOULDER SURGERY Left 1973   "put pin in it where it had separated"   . TONSILLECTOMY  ~ 1956    SOCIAL HISTORY: Social History   Socioeconomic History  . Marital status: Divorced    Spouse name: Not on file  . Number of children: Not on file  . Years of education: Not on file  . Highest education level: Not on file  Occupational History  . Not on file  Tobacco Use  . Smoking status: Never Smoker  . Smokeless tobacco: Never Used  Substance and Sexual Activity  . Alcohol use: Yes    Comment: 07/31/2016 "nothing since 2002"  . Drug use: No  . Sexual activity: Not Currently  Other Topics Concern  . Not on file  Social History Narrative  . Not on file   Social Determinants of Health   Financial Resource Strain:   . Difficulty of Paying Living Expenses: Not on file  Food Insecurity:   .  Worried About Charity fundraiser in the Last Year: Not on file  . Ran Out of Food in the Last Year: Not on file  Transportation Needs:   . Lack of Transportation (Medical): Not on file  . Lack of Transportation (Non-Medical): Not on file  Physical Activity:   . Days of Exercise per Week: Not on file  . Minutes of Exercise per Session: Not on file  Stress:   . Feeling of Stress : Not on file  Social Connections:   . Frequency of Communication with Friends and Family: Not on file  . Frequency of Social Gatherings with Friends and Family: Not on file  . Attends Religious  Services: Not on file  . Active Member of Clubs or Organizations: Not on file  . Attends Archivist Meetings: Not on file  . Marital Status: Not on file  Intimate Partner Violence:   . Fear of Current or Ex-Partner: Not on file  . Emotionally Abused: Not on file  . Physically Abused: Not on file  . Sexually Abused: Not on file    FAMILY HISTORY: Family History  Problem Relation Age of Onset  . Hypertension Other     ALLERGIES:  has No Known Allergies.  MEDICATIONS:  Current Outpatient Medications  Medication Sig Dispense Refill  . acyclovir (ZOVIRAX) 400 MG tablet Take 1 tablet by mouth twice daily 60 tablet 0  . amLODipine (NORVASC) 5 MG tablet Take 5 mg by mouth daily.    Richard Vang atorvastatin (LIPITOR) 40 MG tablet Take 1 tablet (40 mg total) by mouth at bedtime. 30 tablet 12  . carvedilol (COREG) 12.5 MG tablet Take 12.5 mg by mouth 2 (two) times daily with a meal.    . clopidogrel (PLAVIX) 75 MG tablet Take 1 tablet (75 mg total) by mouth daily with breakfast. 30 tablet 12  . cyclobenzaprine (FLEXERIL) 10 MG tablet Take 10 mg by mouth daily as needed for muscle spasms.   2  . escitalopram (LEXAPRO) 10 MG tablet Take 10 mg by mouth daily.    . furosemide (LASIX) 40 MG tablet Take 40 mg by mouth.    . montelukast (SINGULAIR) 10 MG tablet Take 10 mg by mouth daily.    Richard Vang NINLARO 3 MG capsule TAKE 1 CAPSULE (3 MG TOTAL) BY MOUTH ONCE A WEEK. ON DAY 1,8,15 EVERY 28 DAYS. TAKE ON AN EMPTY STOMACH 1HR BEFORE OR 2HRS AFTER FOOD. 3 capsule 1  . omeprazole (PRILOSEC) 40 MG capsule Take 40 mg by mouth daily with breakfast.     . oxyCODONE-acetaminophen (PERCOCET) 10-325 MG tablet Take 1 tablet by mouth 2 (two) times daily. 10 tablet 0  . Potassium Chloride CR (MICRO-K) 8 MEQ CPCR capsule CR Take 8 mEq by mouth daily.    . SYMBICORT 160-4.5 MCG/ACT inhaler Inhale 2 puffs into the lungs 2 (two) times daily as needed (shortness of breath).     . Vitamin D, Ergocalciferol, (DRISDOL) 1.25  MG (50000 UT) CAPS capsule Take 1 capsule by mouth once a week 12 capsule 0  . warfarin (COUMADIN) 5 MG tablet Take 5 mg by mouth daily.     No current facility-administered medications for this visit.   Facility-Administered Medications Ordered in Other Visits  Medication Dose Route Frequency Provider Last Rate Last Admin  . 0.9 %  sodium chloride infusion   Intravenous Continuous Brunetta Genera, MD 10 mL/hr at 07/09/17 1423 New Bag at 07/09/17 1423    REVIEW OF SYSTEMS:  A 10+ POINT REVIEW OF SYSTEMS WAS OBTAINED including neurology, dermatology, psychiatry, cardiac, respiratory, lymph, extremities, GI, GU, Musculoskeletal, constitutional, breasts, reproductive, HEENT.  All pertinent positives are noted in the HPI.  All others are negative.    PHYSICAL EXAMINATION:  ECOG FS:2 - Symptomatic, <50% confined to bed  Vitals:   11/25/19 1043  BP: 112/82  Pulse: (!) 54  Resp: 18  Temp: 98.2 F (36.8 C)  SpO2: 95%   Wt Readings from Last 3 Encounters:  11/25/19 231 lb 12.8 oz (105.1 kg)  09/26/19 233 lb 3.2 oz (105.8 kg)  08/23/19 229 lb 12.8 oz (104.2 kg)   Body mass index is 33.26 kg/m.    GENERAL:alert, in no acute distress and comfortable SKIN: no acute rashes, no significant lesions EYES: conjunctiva are pink and non-injected, sclera anicteric OROPHARYNX: MMM, no exudates, no oropharyngeal erythema or ulceration NECK: supple, no JVD LYMPH:  no palpable lymphadenopathy in the cervical, axillary or inguinal regions LUNGS: clear to auscultation b/l with normal respiratory effort HEART: regular rate & rhythm ABDOMEN:  normoactive bowel sounds , non tender, not distended. Extremity: no pedal edema PSYCH: alert & oriented x 3 with fluent speech NEURO: no focal motor/sensory deficits  LABORATORY DATA:  I have reviewed the data as listed . CBC Latest Ref Rng & Units 11/25/2019 09/26/2019 07/25/2019  WBC 4.0 - 10.5 K/uL 5.4 5.3 6.6  Hemoglobin 13.0 - 17.0 g/dL 12.3(L)  11.7(L) 12.0(L)  Hematocrit 39.0 - 52.0 % 40.4 38.5(L) 38.8(L)  Platelets 150 - 400 K/uL 122(L) 94(L) 85(L)   . CBC    Component Value Date/Time   WBC 5.4 11/25/2019 0914   WBC 5.3 09/26/2019 0804   RBC 4.43 11/25/2019 0914   HGB 12.3 (L) 11/25/2019 0914   HGB 9.2 (L) 10/01/2017 1112   HCT 40.4 11/25/2019 0914   HCT 29.3 (L) 10/01/2017 1112   PLT 122 (L) 11/25/2019 0914   PLT 170 10/01/2017 1112   MCV 91.2 11/25/2019 0914   MCV 88.5 10/01/2017 1112   MCH 27.8 11/25/2019 0914   MCHC 30.4 11/25/2019 0914   RDW 16.1 (H) 11/25/2019 0914   RDW 15.3 (H) 10/01/2017 1112   LYMPHSABS 0.5 (L) 11/25/2019 0914   LYMPHSABS 0.5 (L) 10/01/2017 1112   MONOABS 0.5 11/25/2019 0914   MONOABS 0.5 10/01/2017 1112   EOSABS 0.1 11/25/2019 0914   EOSABS 0.1 10/01/2017 1112   BASOSABS 0.0 11/25/2019 0914   BASOSABS 0.0 10/01/2017 1112    . CMP Latest Ref Rng & Units 09/26/2019 08/23/2019 07/25/2019  Glucose 70 - 99 mg/dL 126(H) - 130(H)  BUN 8 - 23 mg/dL 29(H) - 40(H)  Creatinine 0.61 - 1.24 mg/dL 1.48(H) - 1.57(H)  Sodium 135 - 145 mmol/L 144 - 142  Potassium 3.5 - 5.1 mmol/L 4.2 - 4.0  Chloride 98 - 111 mmol/L 110 - 110  CO2 22 - 32 mmol/L 26 - 24  Calcium 8.9 - 10.3 mg/dL 8.8(L) - 9.0  Total Protein 6.5 - 8.1 g/dL 6.7 6.5 6.4(L)  Total Bilirubin 0.3 - 1.2 mg/dL 0.8 0.7 0.8  Alkaline Phos 38 - 126 U/L 87 95 81  AST 15 - 41 U/L '30 27 25  ' ALT 0 - 44 U/L '26 17 21   ' 03/18/2019 MMP: Component     Latest Ref Rng & Units 03/18/2019  IgG (Immunoglobin G), Serum     603 - 1,613 mg/dL 1,012  IgA     61 - 437 mg/dL 51 (L)  IgM (Immunoglobulin M),  Srm     15 - 143 mg/dL 27  Total Protein ELP     6.0 - 8.5 g/dL 6.3  Albumin SerPl Elph-Mcnc     2.9 - 4.4 g/dL 3.8  Alpha 1     0.0 - 0.4 g/dL 0.2  Alpha2 Glob SerPl Elph-Mcnc     0.4 - 1.0 g/dL 0.7  B-Globulin SerPl Elph-Mcnc     0.7 - 1.3 g/dL 0.8  Gamma Glob SerPl Elph-Mcnc     0.4 - 1.8 g/dL 0.8  M Protein SerPl Elph-Mcnc     Not  Observed g/dL Not Observed  Globulin, Total     2.2 - 3.9 g/dL 2.5  Albumin/Glob SerPl     0.7 - 1.7 1.6  IFE 1      Comment  Please Note (HCV):      Comment    RADIOGRAPHIC STUDIES: I have personally reviewed the radiological images as listed and agreed with the findings in the report. No results found.  ASSESSMENT & PLAN:   74 y.o. 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  K/L 95, No M spike on SPEP -- suggests light chain MM Cytogenetics - normal male chromosomes FISH- +11 and 13q-/-13  Patient is s/p 1 cycle of Vd and Serum free kappa LC has decreased from 700 to 203.6 with improvement in his K/L ratio from 94.59 to 29. Completed 2nd cycle of treatment with Vd + Cytoxan (264m/m2) Completed 3rd cycle of treatment with Vd + Cytoxan (2088mm2) - cytoxan held D15 due to thrombocytopenia Completed 4th of VCd Discontinued VCd after C5D8 due to intolerance with diarrhea and increasing neuropathy and fatigue with borderline functional status. -Patient did not tolerate maintenance Revlimid 10 mg by mouth daily due to grade 2-3 diarrhea. This was discontinued and his diarrhea has resolved.  Currently on maintenance Ninlaro M spike absent   11/15/18 CT Pelvic revealed  3 mm stone in the right UVJ. 5 x 6 mm stone is identified in the left UVJ. No associated distal hydroureter. 2. Stable lucent lesion in the right iliac bone. 3. Small right groin hernia contains only  fat. 11/15/18 CT Lumbar revealed  No acute fracture or malalignment. 2. Severe osteopenia and multiple old compression fractures, severe at T11 and T12. 3. Severe canal stenosis L3-4. Multilevel neural foraminal narrowing: Moderate to severe at T12-L1. 4. Slowly enlarging solid LEFT renal mass. Recommend non emergent MRI renal mass protocol on non emergent basis -No obvious new myeloma involvement seen in most recent CT imaging.         2) CAD s/p prox LAD DES PCI on 07/31/2016 with ischemic cardiomyopathy ejection fraction of 25-35%. Rpt ECHO on 10/03/2016 shows improvement in EF to 55-60% with no RWMABN.  3) Macrocytic Anemia with moderate thrombocytopenia - due to MM and and Ninlaro. Stable.  4) Thrombocytopenia - due to MM and treatment --improved today 108k. --will monitor  5) B12 deficiency - Plan -cont SL B12 100036mpo daily  6) h/o Afib with RVR on coumadin Some bradycardia with BB -continue mx per PCP  and cardiology  7) Back pain -- multiple chronic compression fractures with some worsening -On bisphosphonates already -Ergocalciferol 50k units weekly -Advised that the pt not lift anything heavy  -Discussed the 11/15/18 CT imaging, as noted above, did refer pt to orthopedist -Pt will be following up with orthopedist in December 2020  PLAN:  -Discussed pt labwork today, 11/25/19;  all values are WNL except for Glucose Bld at 106, BUN at 36, Creatinine at 1.49, GFR Est Non Af Am at 46, GFR Est AFR Am at 54, Hemoglobin at 12.3, RDW at 16.1, Platelet Count at 122, Lymphs Abs at 0.5. -Continue Aredia every 8 weeks -The pt has no prohibitive toxicities from continuing Ninlaro. 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 -no signs of myeloma progression at this time.   FOLLOW UP: -Plz schedule next 2 doses of Pamidronate, labs and MD visit every 2 months  The total time spent in the appt was 20 minutes and more than 50% was on counseling and direct patient  cares.  All of the patient's questions were answered with apparent satisfaction. The patient knows to call the clinic with any problems, questions or concerns.   Sullivan Lone MD [place] AAHIVMS [facility] Hematology/Oncology Physician [facility]  (Office):       [phone] (Work cell):  [phone] (Fax):           [phone]  I, Scot Dock, am acting as a scribe for Dr. Sullivan Lone.   .I have reviewed the above documentation for accuracy and completeness, and I agree with the above. Brunetta Genera MD

## 2019-11-28 ENCOUNTER — Other Ambulatory Visit: Payer: Self-pay | Admitting: Hematology

## 2019-12-06 ENCOUNTER — Other Ambulatory Visit: Payer: Self-pay | Admitting: Hematology

## 2019-12-06 DIAGNOSIS — C9000 Multiple myeloma not having achieved remission: Secondary | ICD-10-CM

## 2019-12-06 NOTE — Telephone Encounter (Signed)
Refill request

## 2019-12-18 ENCOUNTER — Ambulatory Visit: Payer: Medicare HMO | Attending: Internal Medicine

## 2019-12-18 DIAGNOSIS — Z23 Encounter for immunization: Secondary | ICD-10-CM | POA: Insufficient documentation

## 2019-12-18 NOTE — Progress Notes (Signed)
   Covid-19 Vaccination Clinic  Name:  Richard Vang    MRN: [id number] DOB: Oct 04, 1946  12/18/2019  [name] was observed post Covid-19 immunization for 15 minutes without incidence. He was provided with Vaccine Information Sheet and instruction to access the V-Safe system.   [name] was instructed to call 911 with any severe reactions post vaccine: [name] Difficulty breathing  . Swelling of your face and throat  . A fast heartbeat  . A bad rash all over your body  . Dizziness and weakness    Immunizations Administered    Name Date Dose VIS Date Route   Pfizer COVID-19 Vaccine 12/18/2019  1:06 PM 0.3 mL 09/30/2019 Intramuscular   Manufacturer: [place]   Lot: [id number]   [place]: [id number]

## 2019-12-22 MED FILL — NINLARO 3 MG CAPS: 3 | 28 days supply | Qty: 3 | Fill #1

## 2020-01-02 ENCOUNTER — Other Ambulatory Visit: Payer: Self-pay | Admitting: Hematology

## 2020-01-02 DIAGNOSIS — C9000 Multiple myeloma not having achieved remission: Secondary | ICD-10-CM

## 2020-01-17 ENCOUNTER — Other Ambulatory Visit: Payer: Self-pay | Admitting: Hematology

## 2020-01-17 ENCOUNTER — Ambulatory Visit: Payer: Medicare HMO | Attending: Internal Medicine

## 2020-01-17 DIAGNOSIS — C9000 Multiple myeloma not having achieved remission: Secondary | ICD-10-CM

## 2020-01-17 DIAGNOSIS — Z23 Encounter for immunization: Secondary | ICD-10-CM

## 2020-01-17 NOTE — Progress Notes (Signed)
   Covid-19 Vaccination Clinic  Name:  Richard Vang    MRN: [id number] DOB: July 10, 1946  01/17/2020  Richard Vang was observed post Covid-19 immunization for 15 minutes without incident. He was provided with Vaccine Information Sheet and instruction to access the V-Safe system.   Richard Vang was instructed to call 911 with any severe reactions post vaccine: [name] Difficulty breathing  . Swelling of face and throat  . A fast heartbeat  . A bad rash all over body  . Dizziness and weakness   Immunizations Administered    Name Date Dose VIS Date Route   Pfizer COVID-19 Vaccine 01/17/2020  8:34 AM 0.3 mL 09/30/2019 Intramuscular   Manufacturer: [place]   Lot: [id number]   [place]: [id number]

## 2020-01-19 MED FILL — NINLARO 3 MG CAPS: 3 | 28 days supply | Qty: 3 | Fill #0

## 2020-01-24 ENCOUNTER — Other Ambulatory Visit: Payer: Self-pay | Admitting: *Deleted

## 2020-01-24 DIAGNOSIS — C9000 Multiple myeloma not having achieved remission: Secondary | ICD-10-CM

## 2020-01-24 NOTE — Progress Notes (Signed)
HEMATOLOGY/ONCOLOGY CLINIC NOTE  Date of Service: 01/25/20    Patient Care Team: Sandi Mariscal, MD as PCP - General (Internal Medicine) Lorretta Harp, MD as PCP - Cardiology (Cardiology)  CHIEF COMPLAINTS/PURPOSE OF CONSULTATION:   F/u for continued management of Myeloma   HISTORY OF PRESENTING ILLNESS:  plz see previous note for details on HPI  INTERVAL HISTORY   [name] is here for his scheduled followup for multiple myeloma. The patient's last visit with [name] was on 11/25/2019. The pt reports that he is doing well overall.  The pt reports that his legs have continued to be tingly and numb. His back pain has also been fairly steady. Pt has been having more SOB and is scheduled to see his Cardiologist in June. He sleeps on his side and never sleeps directly on his back. He has had both doses of the COVID19 vaccine and tolerated them fairly well. Pt continues to walk as much as possible.   Lab results today (01/25/20) of CBC w/diff and CMP is as follows: all values are WNL except for RBC at 4.10, Hgb at 11.3, HCT at 38.0, MCHC at 29.7, RDW at 17.2, PLT at 147K, Lymphs Abs at 0.6K, Sodium at 146, Glucose at 121, BUN at 32, Creatinine at 1.46, GFR Est Non Af Am at 47. 01/25/2020 MMP is in progress 01/25/2020 K/L light chains is in progress  On review of systems, pt reports SOB, numbness/tingling of legs, chronic back pain and denies fevers, chills, cough, leg swelling, unexpected weight loss, constipation, diarrhea and any other symptoms.   MEDICAL HISTORY:  Past Medical History:  Diagnosis Date  . Arthritis    "left shoulder" (07/31/2016)  . Coronary artery disease   . GERD (gastroesophageal reflux disease)   . Heart murmur   . High cholesterol   . Hypertension   . Multiple myeloma ([place])   . Sleep apnea    "probably; having test in November" (07/31/2016)  . Type II diabetes mellitus ([place])     SURGICAL HISTORY: Past Surgical History:  Procedure Laterality Date  .  CARDIAC CATHETERIZATION N/A 07/31/2016   Procedure: Left Heart Cath and Coronary Angiography;  Surgeon: Lorretta Harp, MD;  Location: [facility];  Service: Cardiovascular;  Laterality: N/A;  . CARDIAC CATHETERIZATION N/A 07/31/2016   Procedure: Coronary Stent Intervention;  Surgeon: Lorretta Harp, MD;  Location: [facility];  Service: Cardiovascular;  Laterality: N/A;  . CORONARY ANGIOPLASTY    . OPEN REDUCTION INTERNAL FIXATION (ORIF) METACARPAL Left 05/21/2018   Procedure: OPEN REDUCTION INTERNAL FIXATION (ORIF) 2ND METACARPAL;  Surgeon: Shona Needles, MD;  Location: [facility];  Service: Orthopedics;  Laterality: Left;  . ORIF CLAVICULAR FRACTURE Right 05/21/2018   Procedure: OPEN REDUCTION INTERNAL FIXATION (ORIF) CLAVICULAR FRACTURE;  Surgeon: Shona Needles, MD;  Location: [facility];  Service: Orthopedics;  Laterality: Right;  . SHOULDER SURGERY Left 1973   "put pin in it where it had separated"   . TONSILLECTOMY  ~ 1956    SOCIAL HISTORY: Social History   Socioeconomic History  . Marital status: Divorced    Spouse name: Not on file  . Number of children: Not on file  . Years of education: Not on file  . Highest education level: Not on file  Occupational History  . Not on file  Tobacco Use  . Smoking status: Never Smoker  . Smokeless tobacco: Never Used  Substance and Sexual Activity  . Alcohol use: Yes  Comment: 07/31/2016 "nothing since 2002"  . Drug use: No  . Sexual activity: Not Currently  Other Topics Concern  . Not on file  Social History Narrative  . Not on file   Social Determinants of Health   Financial Resource Strain:   . Difficulty of Paying Living Expenses:   Food Insecurity:   . Worried About Charity fundraiser in the Last Year:   . Arboriculturist in the Last Year:   Transportation Needs:   . Film/video editor (Medical):   [name] Lack of Transportation (Non-Medical):   Physical Activity:   . Days of Exercise per Week:   . Minutes  of Exercise per Session:   Stress:   . Feeling of Stress :   Social Connections:   . Frequency of Communication with Friends and Family:   . Frequency of Social Gatherings with Friends and Family:   . Attends Religious Services:   . Active Member of Clubs or Organizations:   . Attends Archivist Meetings:   [name] Marital Status:   Intimate Partner Violence:   . Fear of Current or Ex-Partner:   . Emotionally Abused:   [name] Physically Abused:   . Sexually Abused:     FAMILY HISTORY: Family History  Problem Relation Age of Onset  . Hypertension Other     ALLERGIES:  has No Known Allergies.  MEDICATIONS:  Current Outpatient Medications  Medication Sig Dispense Refill  . acyclovir (ZOVIRAX) 400 MG tablet Take 1 tablet by mouth twice daily 60 tablet 0  . amLODipine (NORVASC) 5 MG tablet Take 5 mg by mouth daily.    [name] atorvastatin (LIPITOR) 40 MG tablet Take 1 tablet (40 mg total) by mouth at bedtime. 30 tablet 12  . carvedilol (COREG) 12.5 MG tablet Take 12.5 mg by mouth 2 (two) times daily with a meal.    . clopidogrel (PLAVIX) 75 MG tablet Take 1 tablet (75 mg total) by mouth daily with breakfast. 30 tablet 12  . cyclobenzaprine (FLEXERIL) 10 MG tablet Take 10 mg by mouth daily as needed for muscle spasms.   2  . escitalopram (LEXAPRO) 10 MG tablet Take 10 mg by mouth daily.    . furosemide (LASIX) 40 MG tablet Take 40 mg by mouth.    . montelukast (SINGULAIR) 10 MG tablet Take 10 mg by mouth daily.    [name] NINLARO 3 MG capsule TAKE 1 CAPSULE (3 MG TOTAL) BY MOUTH ONCE A WEEK. ON DAY 1,8,15 EVERY 28 DAYS. TAKE ON AN EMPTY STOMACH 1HR BEFORE OR 2HRS AFTER FOOD. 3 capsule 1  . omeprazole (PRILOSEC) 40 MG capsule Take 40 mg by mouth daily with breakfast.     . oxyCODONE-acetaminophen (PERCOCET) 10-325 MG tablet Take 1 tablet by mouth 2 (two) times daily. 10 tablet 0  . Potassium Chloride CR (MICRO-K) 8 MEQ CPCR capsule CR Take 8 mEq by mouth daily.    . SYMBICORT 160-4.5 MCG/ACT  inhaler Inhale 2 puffs into the lungs 2 (two) times daily as needed (shortness of breath).     . Vitamin D, Ergocalciferol, (DRISDOL) 1.25 MG (50000 UNIT) CAPS capsule Take 1 capsule by mouth once a week 12 capsule 0  . warfarin (COUMADIN) 5 MG tablet Take 5 mg by mouth daily.     No current facility-administered medications for this visit.   Facility-Administered Medications Ordered in Other Visits  Medication Dose Route Frequency Provider Last Rate Last Admin  . 0.9 %  sodium chloride infusion  Intravenous Continuous Brunetta Genera, MD 10 mL/hr at 07/09/17 1423 New Bag at 07/09/17 1423    REVIEW OF SYSTEMS:   A 10+ POINT REVIEW OF SYSTEMS WAS OBTAINED including neurology, dermatology, psychiatry, cardiac, respiratory, lymph, extremities, GI, GU, Musculoskeletal, constitutional, breasts, reproductive, HEENT.  All pertinent positives are noted in the HPI.  All others are negative.   PHYSICAL EXAMINATION:  ECOG FS:2 - Symptomatic, <50% confined to bed  Vitals:   01/25/20 1027  BP: 104/67  Pulse: 65  Resp: 20  Temp: 98.2 F (36.8 C)  SpO2: 97%   Wt Readings from Last 3 Encounters:  01/25/20 231 lb 14.4 oz (105.2 kg)  11/25/19 231 lb 12.8 oz (105.1 kg)  09/26/19 233 lb 3.2 oz (105.8 kg)   Body mass index is 33.27 kg/m.    Exam was given in a chair   GENERAL:alert, in no acute distress and comfortable SKIN: no acute rashes, no significant lesions EYES: conjunctiva are pink and non-injected, sclera anicteric OROPHARYNX: MMM, no exudates, no oropharyngeal erythema or ulceration NECK: supple, no JVD LYMPH:  no palpable lymphadenopathy in the cervical, axillary or inguinal regions LUNGS: clear to auscultation b/l with normal respiratory effort HEART: regular rate & rhythm ABDOMEN:  normoactive bowel sounds , non tender, not distended. No palpable hepatosplenomegaly.  Extremity: no pedal edema PSYCH: alert & oriented x 3 with fluent speech NEURO: no focal motor/sensory  deficits  LABORATORY DATA:  I have reviewed the data as listed . CBC Latest Ref Rng & Units 01/25/2020 11/25/2019 09/26/2019  WBC 4.0 - 10.5 K/uL 5.6 5.4 5.3  Hemoglobin 13.0 - 17.0 g/dL 11.3(L) 12.3(L) 11.7(L)  Hematocrit 39.0 - 52.0 % 38.0(L) 40.4 38.5(L)  Platelets 150 - 400 K/uL 147(L) 122(L) 94(L)   . CBC    Component Value Date/Time   WBC 5.6 01/25/2020 1000   WBC 5.3 09/26/2019 0804   RBC 4.10 (L) 01/25/2020 1000   HGB 11.3 (L) 01/25/2020 1000   HGB 9.2 (L) 10/01/2017 1112   HCT 38.0 (L) 01/25/2020 1000   HCT 29.3 (L) 10/01/2017 1112   PLT 147 (L) 01/25/2020 1000   PLT 170 10/01/2017 1112   MCV 92.7 01/25/2020 1000   MCV 88.5 10/01/2017 1112   MCH 27.6 01/25/2020 1000   MCHC 29.7 (L) 01/25/2020 1000   RDW 17.2 (H) 01/25/2020 1000   RDW 15.3 (H) 10/01/2017 1112   LYMPHSABS 0.6 (L) 01/25/2020 1000   LYMPHSABS 0.5 (L) 10/01/2017 1112   MONOABS 0.5 01/25/2020 1000   MONOABS 0.5 10/01/2017 1112   EOSABS 0.1 01/25/2020 1000   EOSABS 0.1 10/01/2017 1112   BASOSABS 0.0 01/25/2020 1000   BASOSABS 0.0 10/01/2017 1112    . CMP Latest Ref Rng & Units 01/25/2020 11/25/2019 09/26/2019  Glucose 70 - 99 mg/dL 121(H) 106(H) 126(H)  BUN 8 - 23 mg/dL 32(H) 36(H) 29(H)  Creatinine 0.61 - 1.24 mg/dL 1.46(H) 1.49(H) 1.48(H)  Sodium 135 - 145 mmol/L 146(H) 144 144  Potassium 3.5 - 5.1 mmol/L 3.9 4.7 4.2  Chloride 98 - 111 mmol/L 110 110 110  CO2 22 - 32 mmol/L _0 Calcium 8.9 - 10.3 mg/dL 9.1 9.0 8.8(L)  Total Protein 6.5 - 8.1 g/dL 7.0 7.1 6.7  Total Bilirubin 0.3 - 1.2 mg/dL 0.9 0.8 0.8  Alkaline Phos 38 - 126 U/L 83 86 87  AST 15 - 41 U/L 30 32 30  ALT 0 - 44 U/L _1 03/18/2019 MMP: Component  Latest Ref Rng & Units 03/18/2019  IgG (Immunoglobin G), Serum     603 - 1,613 mg/dL 1,012  IgA     61 - 437 mg/dL 51 (L)  IgM (Immunoglobulin M), Srm     15 - 143 mg/dL 27  Total Protein ELP     6.0 - 8.5 g/dL 6.3  Albumin SerPl Elph-Mcnc     2.9 - 4.4 g/dL 3.8    Alpha 1     0.0 - 0.4 g/dL 0.2  Alpha2 Glob SerPl Elph-Mcnc     0.4 - 1.0 g/dL 0.7  B-Globulin SerPl Elph-Mcnc     0.7 - 1.3 g/dL 0.8  Gamma Glob SerPl Elph-Mcnc     0.4 - 1.8 g/dL 0.8  M Protein SerPl Elph-Mcnc     Not Observed g/dL Not Observed  Globulin, Total     2.2 - 3.9 g/dL 2.5  Albumin/Glob SerPl     0.7 - 1.7 1.6  IFE 1      Comment  Please Note (HCV):      Comment    RADIOGRAPHIC STUDIES: I have personally reviewed the radiological images as listed and agreed with the findings in the report. No results found.  ASSESSMENT & PLAN:   74 y.o. 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  K/L 95, No M spike on SPEP -- suggests light chain MM Cytogenetics - normal male chromosomes FISH- +11 and 13q-/-13  Patient is s/p 1 cycle of Vd and Serum free kappa LC has decreased from 700 to 203.6 with improvement in his K/L ratio from 94.59 to 29. Completed 2nd cycle of treatment with Vd + Cytoxan (222m/m2) Completed 3rd cycle of treatment with Vd + Cytoxan (2032mm2) - cytoxan held D15 due to thrombocytopenia Completed 4th of VCd Discontinued VCd after C5D8 due to intolerance with diarrhea and increasing neuropathy and fatigue with borderline functional status. -Patient did not tolerate maintenance Revlimid 10 mg by mouth daily due to grade 2-3 diarrhea. This was discontinued and his diarrhea has resolved.  Currently on maintenance Ninlaro M spike absent    11/15/18 CT Pelvic revealed  3 mm stone in the right UVJ. 5 x 6 mm stone is identified in the left UVJ. No associated distal hydroureter. 2. Stable lucent lesion in the right iliac bone. 3. Small right groin hernia contains only fat. 11/15/18 CT Lumbar revealed  No acute fracture or malalignment. 2. Severe osteopenia and multiple old compression fractures, severe at T11 and T12. 3. Severe canal stenosis L3-4. Multilevel neural foraminal narrowing: Moderate to severe at T12-L1. 4. Slowly enlarging solid LEFT renal mass. Recommend non emergent MRI renal mass protocol on non emergent basis -No obvious new myeloma involvement seen in most recent CT imaging.         2) CAD s/p prox LAD DES PCI on 07/31/2016 with ischemic cardiomyopathy ejection fraction of 25-35%. Rpt ECHO on 10/03/2016 shows improvement in EF to 55-60% with no RWMABN.  3) Macrocytic Anemia with moderate thrombocytopenia - due to MM and and Ninlaro. Stable.  4) Thrombocytopenia - due  to MM and treatment --improved today 108k. --will monitor  5) B12 deficiency - Plan -cont SL B12 1043mg po daily  6) h/o Afib with RVR on coumadin Some bradycardia with BB -continue mx per PCP and cardiology  7) Back pain -- multiple chronic compression fractures with some worsening -On bisphosphonates already -Ergocalciferol 50k units weekly -Advised that the pt not lift anything heavy  -Discussed the 11/15/18 CT imaging, as noted above, did refer pt to orthopedist -Pt will be following up with orthopedist in December 2020  PLAN:  -Discussed pt labwork today, 01/25/20; anemia, PLT have improved, stable kidney function, liver enzymes and electrolytes are good.  -Discussed 01/25/2020 MMP is in progress, last M Protein was "Not Observed" (09/26/19) -Discussed 01/25/2020 K/L light chains is in progress, last K/L light chains were improved at 2.91 (09/26/19) -The pt has no prohibitive toxicities from continuing Ninlaro. No change in his grade 1  neuropathy vs radiculopathy. -The pt shows no lab or clinical evidence of myeloma progression at this time. -Recommend pt visit Cardiologist as soon as possible for worsening SOB -Continue Aredia every 8 weeks -Will see back in 8 weeks with labs   FOLLOW UP: -Plz schedule next 2 doses of Pamidronate, labs and MD visit every 2 months   The total time spent in the appt was 20 minutes and more than 50% was on counseling and direct patient cares.  All of the patient's questions were answered with apparent satisfaction. The patient knows to call the clinic with any problems, questions or concerns.   GSullivan LoneMD [email or web address] SBrooke Glen Behavioral HospitalCMemorial Hospital For Cancer And Allied DiseasesHematology/Oncology Physician CE[facility] (Office):       [phone](Work cell):  [phone](Fax):           [phone] I, JYevette Edwards am acting as a scribe for Dr. GSullivan Lone   .I have reviewed the above documentation for accuracy and completeness, and I agree with the above. .Brunetta GeneraMD

## 2020-01-25 ENCOUNTER — Inpatient Hospital Stay: Payer: Medicare HMO | Admitting: Hematology

## 2020-01-25 ENCOUNTER — Inpatient Hospital Stay: Payer: Medicare HMO | Attending: Hematology

## 2020-01-25 ENCOUNTER — Other Ambulatory Visit: Payer: Self-pay

## 2020-01-25 ENCOUNTER — Inpatient Hospital Stay (HOSPITAL_BASED_OUTPATIENT_CLINIC_OR_DEPARTMENT_OTHER): Payer: Medicare HMO | Admitting: Medical

## 2020-01-25 ENCOUNTER — Inpatient Hospital Stay: Payer: Medicare HMO

## 2020-01-25 VITALS — BP 104/67 | HR 65 | Temp 98.2°F | Resp 20 | Ht 70.0 in | Wt 231.9 lb

## 2020-01-25 DIAGNOSIS — R0602 Shortness of breath: Secondary | ICD-10-CM | POA: Diagnosis not present

## 2020-01-25 DIAGNOSIS — E538 Deficiency of other specified B group vitamins: Secondary | ICD-10-CM | POA: Diagnosis not present

## 2020-01-25 DIAGNOSIS — D696 Thrombocytopenia, unspecified: Secondary | ICD-10-CM | POA: Diagnosis not present

## 2020-01-25 DIAGNOSIS — I255 Ischemic cardiomyopathy: Secondary | ICD-10-CM | POA: Diagnosis not present

## 2020-01-25 DIAGNOSIS — C9000 Multiple myeloma not having achieved remission: Secondary | ICD-10-CM | POA: Insufficient documentation

## 2020-01-25 DIAGNOSIS — I10 Essential (primary) hypertension: Secondary | ICD-10-CM | POA: Diagnosis not present

## 2020-01-25 DIAGNOSIS — D6959 Other secondary thrombocytopenia: Secondary | ICD-10-CM | POA: Insufficient documentation

## 2020-01-25 DIAGNOSIS — E1140 Type 2 diabetes mellitus with diabetic neuropathy, unspecified: Secondary | ICD-10-CM | POA: Diagnosis not present

## 2020-01-25 DIAGNOSIS — I2510 Atherosclerotic heart disease of native coronary artery without angina pectoris: Secondary | ICD-10-CM | POA: Diagnosis not present

## 2020-01-25 DIAGNOSIS — Z7901 Long term (current) use of anticoagulants: Secondary | ICD-10-CM | POA: Diagnosis not present

## 2020-01-25 DIAGNOSIS — Z7951 Long term (current) use of inhaled steroids: Secondary | ICD-10-CM | POA: Diagnosis not present

## 2020-01-25 DIAGNOSIS — G8929 Other chronic pain: Secondary | ICD-10-CM | POA: Insufficient documentation

## 2020-01-25 DIAGNOSIS — Z79899 Other long term (current) drug therapy: Secondary | ICD-10-CM | POA: Diagnosis not present

## 2020-01-25 DIAGNOSIS — Z7189 Other specified counseling: Secondary | ICD-10-CM

## 2020-01-25 DIAGNOSIS — G4730 Sleep apnea, unspecified: Secondary | ICD-10-CM | POA: Insufficient documentation

## 2020-01-25 DIAGNOSIS — M549 Dorsalgia, unspecified: Secondary | ICD-10-CM | POA: Diagnosis not present

## 2020-01-25 DIAGNOSIS — E7800 Pure hypercholesterolemia, unspecified: Secondary | ICD-10-CM | POA: Insufficient documentation

## 2020-01-25 DIAGNOSIS — I4891 Unspecified atrial fibrillation: Secondary | ICD-10-CM | POA: Insufficient documentation

## 2020-01-25 DIAGNOSIS — K219 Gastro-esophageal reflux disease without esophagitis: Secondary | ICD-10-CM | POA: Diagnosis not present

## 2020-01-25 DIAGNOSIS — M1990 Unspecified osteoarthritis, unspecified site: Secondary | ICD-10-CM | POA: Insufficient documentation

## 2020-01-25 LAB — CBC WITH DIFFERENTIAL (CANCER CENTER ONLY)
Abs Immature Granulocytes: 0.04 10*3/uL (ref 0.00–0.07)
Basophils Absolute: 0 10*3/uL (ref 0.0–0.1)
Basophils Relative: 1 %
Eosinophils Absolute: 0.1 10*3/uL (ref 0.0–0.5)
Eosinophils Relative: 1 %
HCT: 38 % — ABNORMAL LOW (ref 39.0–52.0)
Hemoglobin: 11.3 g/dL — ABNORMAL LOW (ref 13.0–17.0)
Immature Granulocytes: 1 %
Lymphocytes Relative: 11 %
Lymphs Abs: 0.6 10*3/uL — ABNORMAL LOW (ref 0.7–4.0)
MCH: 27.6 pg (ref 26.0–34.0)
MCHC: 29.7 g/dL — ABNORMAL LOW (ref 30.0–36.0)
MCV: 92.7 fL (ref 80.0–100.0)
Monocytes Absolute: 0.5 10*3/uL (ref 0.1–1.0)
Monocytes Relative: 9 %
Neutro Abs: 4.3 10*3/uL (ref 1.7–7.7)
Neutrophils Relative %: 77 %
Platelet Count: 147 10*3/uL — ABNORMAL LOW (ref 150–400)
RBC: 4.1 MIL/uL — ABNORMAL LOW (ref 4.22–5.81)
RDW: 17.2 % — ABNORMAL HIGH (ref 11.5–15.5)
WBC Count: 5.6 10*3/uL (ref 4.0–10.5)
nRBC: 0 % (ref 0.0–0.2)

## 2020-01-25 LAB — CMP (CANCER CENTER ONLY)
ALT: 22 U/L (ref 0–44)
AST: 30 U/L (ref 15–41)
Albumin: 3.5 g/dL (ref 3.5–5.0)
Alkaline Phosphatase: 83 U/L (ref 38–126)
Anion gap: 10 (ref 5–15)
BUN: 32 mg/dL — ABNORMAL HIGH (ref 8–23)
CO2: 26 mmol/L (ref 22–32)
Calcium: 9.1 mg/dL (ref 8.9–10.3)
Chloride: 110 mmol/L (ref 98–111)
Creatinine: 1.46 mg/dL — ABNORMAL HIGH (ref 0.61–1.24)
GFR, Est AFR Am: 55 mL/min — ABNORMAL LOW (ref >60)
GFR, Estimated: 47 mL/min — ABNORMAL LOW (ref >60)
Glucose, Bld: 121 mg/dL — ABNORMAL HIGH (ref 70–99)
Potassium: 3.9 mmol/L (ref 3.5–5.1)
Sodium: 146 mmol/L — ABNORMAL HIGH (ref 135–145)
Total Bilirubin: 0.9 mg/dL (ref 0.3–1.2)
Total Protein: 7 g/dL (ref 6.5–8.1)

## 2020-01-25 MED ORDER — SODIUM CHLORIDE 0.9 % IV SOLN
Freq: Once | INTRAVENOUS | Status: AC
  Filled 2020-01-25: qty 250

## 2020-01-25 MED ORDER — SODIUM CHLORIDE 0.9 % IV SOLN
60.0000 mg | Freq: Once | INTRAVENOUS | Status: AC
  Administered 2020-01-25: 60 mg via INTRAVENOUS
  Filled 2020-01-25: qty 10

## 2020-01-25 NOTE — Patient Instructions (Signed)
Pamidronate injection What is this medicine? PAMIDRONATE (pa mi DROE nate) slows calcium loss from bones. It is used to treat high calcium blood levels from cancer or Paget's disease. It is also used to treat bone pain and prevent fractures from certain cancers that have spread to the bone. This medicine may be used for other purposes; ask your health care provider or pharmacist if you have questions. COMMON BRAND NAME(S): Aredia What should I tell my health care provider before I take this medicine? They need to know if you have any of these conditions:  aspirin-sensitive asthma  dental disease  kidney disease  an unusual or allergic reaction to pamidronate, other medicines, foods, dyes, or preservatives  pregnant or trying to get pregnant  breast-feeding How should I use this medicine? This medicine is for infusion into a vein. It is given by a health care professional in a hospital or clinic setting. Talk to your pediatrician regarding the use of this medicine in children. This medicine is not approved for use in children. Overdosage: If you think you have taken too much of this medicine contact a poison control center or emergency room at once. NOTE: This medicine is only for you. Do not share this medicine with others. What if I miss a dose? This does not apply. What may interact with this medicine?  certain antibiotics given by injection  medicines for inflammation or pain like ibuprofen, naproxen  some diuretics like bumetanide, furosemide  cyclosporine  parathyroid hormone  tacrolimus  teriparatide  thalidomide This list may not describe all possible interactions. Give your health care provider a list of all the medicines, herbs, non-prescription drugs, or dietary supplements you use. Also tell them if you smoke, drink alcohol, or use illegal drugs. Some items may interact with your medicine. What should I watch for while using this medicine? Visit your doctor or  health care professional for regular checkups. It may be some time before you see the benefit from this medicine. Do not stop taking your medicine unless your doctor tells you to. Your doctor may order blood tests or other tests to see how you are doing. Women should inform their doctor if they wish to become pregnant or think they might be pregnant. There is a potential for serious side effects to an unborn child. Talk to your health care professional or pharmacist for more information. You should make sure that you get enough calcium and vitamin D while you are taking this medicine. Discuss the foods you eat and the vitamins you take with your health care professional. Some people who take this medicine have severe bone, joint, and/or muscle pain. This medicine may also increase your risk for a broken thigh bone. Tell your doctor right away if you have pain in your upper leg or groin. Tell your doctor if you have any pain that does not go away or that gets worse. What side effects may I notice from receiving this medicine? Side effects that you should report to your doctor or health care professional as soon as possible:  allergic reactions like skin rash, itching or hives, swelling of the face, lips, or tongue  black or tarry stools  changes in vision  eye inflammation, pain  high blood pressure  jaw pain, especially burning or cramping  muscle weakness  numb, tingling pain  swelling of feet or hands  trouble passing urine or change in the amount of urine  unable to move easily Side effects that usually do not  require medical attention (report to your doctor or health care professional if they continue or are bothersome):  bone, joint, or muscle pain  constipation  dizzy, drowsy  fever  headache  loss of appetite  nausea, vomiting  pain at site where injected This list may not describe all possible side effects. Call your doctor for medical advice about side effects.  You may report side effects to FDA at 1-800-FDA-1088. Where should I keep my medicine? This drug is given in a hospital or clinic and will not be stored at home. NOTE: This sheet is a summary. It may not cover all possible information. If you have questions about this medicine, talk to your doctor, pharmacist, or health care provider.  2020 Elsevier/Gold Standard (2011-04-04 08:49:49)  

## 2020-01-25 NOTE — Progress Notes (Signed)
The patient was seen at the reception area for secondary COVID-19 screening. He reported a slightly sore and scratchy throat but had an otherwise negative review of systems. He was cleared to proceed with his visit today.  Sandi Mealy, MHS, PA-C Physician Assistant

## 2020-01-26 LAB — KAPPA/LAMBDA LIGHT CHAINS
Kappa free light chain: 66.9 mg/L — ABNORMAL HIGH (ref 3.3–19.4)
Kappa, lambda light chain ratio: 3.03 — ABNORMAL HIGH (ref 0.26–1.65)
Lambda free light chains: 22.1 mg/L (ref 5.7–26.3)

## 2020-01-27 ENCOUNTER — Telehealth: Payer: Self-pay | Admitting: Hematology and Oncology

## 2020-01-27 NOTE — Telephone Encounter (Signed)
Scheduled per los, spoke with patient's relative and patient will be notified of upcoming appointments.

## 2020-01-30 LAB — MULTIPLE MYELOMA PANEL, SERUM
Albumin SerPl Elph-Mcnc: 3.4 g/dL (ref 2.9–4.4)
Albumin/Glob SerPl: 1.1 (ref 0.7–1.7)
Alpha 1: 0.2 g/dL (ref 0.0–0.4)
Alpha2 Glob SerPl Elph-Mcnc: 0.7 g/dL (ref 0.4–1.0)
B-Globulin SerPl Elph-Mcnc: 0.9 g/dL (ref 0.7–1.3)
Gamma Glob SerPl Elph-Mcnc: 1.2 g/dL (ref 0.4–1.8)
Globulin, Total: 3.1 g/dL (ref 2.2–3.9)
IgA: 72 mg/dL (ref 61–437)
IgG (Immunoglobin G), Serum: 1219 mg/dL (ref 603–1613)
IgM (Immunoglobulin M), Srm: 37 mg/dL (ref 15–143)
Total Protein ELP: 6.5 g/dL (ref 6.0–8.5)

## 2020-02-06 ENCOUNTER — Other Ambulatory Visit: Payer: Self-pay | Admitting: Hematology

## 2020-02-06 DIAGNOSIS — C9000 Multiple myeloma not having achieved remission: Secondary | ICD-10-CM

## 2020-02-06 NOTE — Telephone Encounter (Signed)
Patient request refill

## 2020-02-15 MED FILL — NINLARO 3 MG CAPS: 3 | 28 days supply | Qty: 3 | Fill #1

## 2020-02-23 ENCOUNTER — Ambulatory Visit: Payer: Medicare HMO | Admitting: Cardiology

## 2020-02-23 ENCOUNTER — Telehealth: Payer: Self-pay | Admitting: Hematology

## 2020-02-23 ENCOUNTER — Ambulatory Visit
Admission: RE | Admit: 2020-02-23 | Discharge: 2020-02-23 | Disposition: A | Discharge status: Home / Self Care | Payer: Medicare HMO | Source: Ambulatory Visit | Attending: Cardiology | Admitting: Cardiology

## 2020-02-23 ENCOUNTER — Encounter: Payer: Self-pay | Admitting: Cardiology

## 2020-02-23 ENCOUNTER — Telehealth: Payer: Self-pay | Admitting: *Deleted

## 2020-02-23 ENCOUNTER — Other Ambulatory Visit: Payer: Self-pay

## 2020-02-23 VITALS — BP 114/70 | HR 73 | Ht 70.0 in | Wt 227.8 lb

## 2020-02-23 DIAGNOSIS — I2510 Atherosclerotic heart disease of native coronary artery without angina pectoris: Secondary | ICD-10-CM

## 2020-02-23 DIAGNOSIS — C9000 Multiple myeloma not having achieved remission: Secondary | ICD-10-CM

## 2020-02-23 DIAGNOSIS — I5032 Chronic diastolic (congestive) heart failure: Secondary | ICD-10-CM

## 2020-02-23 DIAGNOSIS — R0609 Other forms of dyspnea: Secondary | ICD-10-CM

## 2020-02-23 DIAGNOSIS — E785 Hyperlipidemia, unspecified: Secondary | ICD-10-CM

## 2020-02-23 DIAGNOSIS — E119 Type 2 diabetes mellitus without complications: Secondary | ICD-10-CM

## 2020-02-23 DIAGNOSIS — I10 Essential (primary) hypertension: Secondary | ICD-10-CM

## 2020-02-23 DIAGNOSIS — R0600 Dyspnea, unspecified: Secondary | ICD-10-CM

## 2020-02-23 DIAGNOSIS — N1831 Chronic kidney disease, stage 3a: Secondary | ICD-10-CM

## 2020-02-23 DIAGNOSIS — I482 Chronic atrial fibrillation, unspecified: Secondary | ICD-10-CM | POA: Diagnosis not present

## 2020-02-23 DIAGNOSIS — Z7901 Long term (current) use of anticoagulants: Secondary | ICD-10-CM

## 2020-02-23 DIAGNOSIS — Z9861 Coronary angioplasty status: Secondary | ICD-10-CM

## 2020-02-23 NOTE — Telephone Encounter (Signed)
Received Telephone Advice fax from after hours [facility]: Patient needs to reschedule appt Contacted wife/patient. Needs to reschedule current appointments on 6/8 (lab/Dr. Kale/Infusion). Patient will be out of town that week  Schedule message sent.

## 2020-02-23 NOTE — Assessment & Plan Note (Signed)
CAF- rate controlled

## 2020-02-23 NOTE — Assessment & Plan Note (Signed)
Admitted to [facility] 8/10-8/08/2019- 7 lb diuresis. DC weight 225 lbs. EF 25-30% in 2017 pre PCI- EF 50-55% 05/31/2019 by echo-diuretic added 

## 2020-02-23 NOTE — Assessment & Plan Note (Signed)
Followed by Dr Kale 

## 2020-02-23 NOTE — Assessment & Plan Note (Signed)
Coumadin Rx- [facility] VASC= 4-Dr Irene Limbo follows

## 2020-02-23 NOTE — Assessment & Plan Note (Signed)
Controlled.  

## 2020-02-23 NOTE — Assessment & Plan Note (Signed)
On statin Rx 

## 2020-02-23 NOTE — Patient Instructions (Signed)
Medication Instructions:  No changes *If you need a refill on your cardiac medications before your next appointment, please call your pharmacy*   Lab Work: Your provider would like for you to have the following labs today: BNP  If you have labs (blood work) drawn today and your tests are completely normal, you will receive your results only by: [name] MyChart Message (if you have MyChart) OR . A paper copy in the mail If you have any lab test that is abnormal or we need to change your treatment, we will call you to review the results.   Testing/Procedures: A chest x-ray takes a picture of the organs and structures inside the chest, including the heart, lungs, and blood vessels. This test can show several things, including, whether the heart is enlarges; whether fluid is building up in the lungs; and whether pacemaker / defibrillator leads are still in place.  This can be done today at [facility] at [address]    Follow-Up: At [facility], you and your health needs are our priority.  As part of our continuing mission to provide you with exceptional heart care, we have created designated Provider Care Teams.  These Care Teams include your primary Cardiologist (physician) and Advanced Practice Providers (APPs -  Physician Assistants and Nurse Practitioners) who all work together to provide you with the care you need, when you need it.  We recommend signing up for the patient portal called "MyChart".  Sign up information is provided on this After Visit Summary.  MyChart is used to connect with patients for Virtual Visits (Telemedicine).  Patients are able to view lab/test results, encounter notes, upcoming appointments, etc.  Non-urgent messages can be sent to your provider as well.   To learn more about what you can do with MyChart, go to [email or web address].    Your next appointment:   1 week(s)  The format for your next appointment:   Virtual Visit   Provider:    Kerin Ransom, [place]

## 2020-02-23 NOTE — Assessment & Plan Note (Signed)
GFR 47, SCr 1.46

## 2020-02-23 NOTE — Telephone Encounter (Signed)
R/s appt per 5/6 sch message - pt aware of appt date and time

## 2020-02-23 NOTE — Assessment & Plan Note (Signed)
Pt has chronic DOE - r/o recurrent DCHF
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
## 2020-02-23 NOTE — Assessment & Plan Note (Signed)
LAD PCI with DES after abnormal Myoview Oct 2017 (Dr Rossario pt) 

## 2020-02-24 LAB — BRAIN NATRIURETIC PEPTIDE: BNP: 306.9 pg/mL — ABNORMAL HIGH (ref 0.0–100.0)

## 2020-03-01 ENCOUNTER — Telehealth (INDEPENDENT_AMBULATORY_CARE_PROVIDER_SITE_OTHER): Payer: Medicare HMO | Admitting: Cardiology

## 2020-03-01 ENCOUNTER — Telehealth: Payer: Medicare HMO | Admitting: Cardiology

## 2020-03-01 ENCOUNTER — Encounter: Payer: Self-pay | Admitting: Cardiology

## 2020-03-01 ENCOUNTER — Telehealth: Payer: Self-pay

## 2020-03-01 DIAGNOSIS — Z9861 Coronary angioplasty status: Secondary | ICD-10-CM

## 2020-03-01 DIAGNOSIS — I10 Essential (primary) hypertension: Secondary | ICD-10-CM | POA: Diagnosis not present

## 2020-03-01 DIAGNOSIS — I2510 Atherosclerotic heart disease of native coronary artery without angina pectoris: Secondary | ICD-10-CM | POA: Diagnosis not present

## 2020-03-01 DIAGNOSIS — N1831 Chronic kidney disease, stage 3a: Secondary | ICD-10-CM

## 2020-03-01 DIAGNOSIS — R0609 Other forms of dyspnea: Secondary | ICD-10-CM

## 2020-03-01 DIAGNOSIS — I482 Chronic atrial fibrillation, unspecified: Secondary | ICD-10-CM | POA: Diagnosis not present

## 2020-03-01 DIAGNOSIS — R0600 Dyspnea, unspecified: Secondary | ICD-10-CM

## 2020-03-01 DIAGNOSIS — I5032 Chronic diastolic (congestive) heart failure: Secondary | ICD-10-CM

## 2020-03-01 DIAGNOSIS — Z7901 Long term (current) use of anticoagulants: Secondary | ICD-10-CM

## 2020-03-01 NOTE — Telephone Encounter (Signed)
  Patient Consent for Virtual Visit         Richard Vang has provided verbal consent on 03/01/2020 for a virtual visit (video or telephone).   CONSENT FOR VIRTUAL VISIT FOR:  Richard Vang  By participating in this virtual visit I agree to the following:  I hereby voluntarily request, consent and authorize [facility] and its employed or contracted physicians, physician assistants, nurse practitioners or other licensed health care professionals (the Practitioner), to provide me with telemedicine health care services (the "Services") as deemed necessary by the treating Practitioner. I acknowledge and consent to receive the Services by the Practitioner via telemedicine. I understand that the telemedicine visit will involve communicating with the Practitioner through live audiovisual communication technology and the disclosure of certain medical information by electronic transmission. I acknowledge that I have been given the opportunity to request an in-person assessment or other available alternative prior to the telemedicine visit and am voluntarily participating in the telemedicine visit.  I understand that I have the right to withhold or withdraw my consent to the use of telemedicine in the course of my care at any time, without affecting my right to future care or treatment, and that the Practitioner or I may terminate the telemedicine visit at any time. I understand that I have the right to inspect all information obtained and/or recorded in the course of the telemedicine visit and may receive copies of available information for a reasonable fee.  I understand that some of the potential risks of receiving the Services via telemedicine include:  [name] Delay or interruption in medical evaluation due to technological equipment failure or disruption; . Information transmitted may not be sufficient (e.g. poor resolution of images) to allow for appropriate medical decision making by the Practitioner;  and/or  . In rare instances, security protocols could fail, causing a breach of personal health information.  Furthermore, I acknowledge that it is my responsibility to provide information about my medical history, conditions and care that is complete and accurate to the best of my ability. I acknowledge that Practitioner's advice, recommendations, and/or decision may be based on factors not within their control, such as incomplete or inaccurate data provided by me or distortions of diagnostic images or specimens that may result from electronic transmissions. I understand that the practice of medicine is not an exact science and that Practitioner makes no warranties or guarantees regarding treatment outcomes. I acknowledge that a copy of this consent can be made available to me via my patient portal ([facility]), or I can request a printed copy by calling the office of [facility].    I understand that my insurance will be billed for this visit.   I have read or had this consent read to me. . I understand the contents of this consent, which adequately explains the benefits and risks of the Services being provided via telemedicine.  . I have been provided ample opportunity to ask questions regarding this consent and the Services and have had my questions answered to my satisfaction. . I give my informed consent for the services to be provided through the use of telemedicine in my medical care

## 2020-03-01 NOTE — Patient Instructions (Addendum)
Medication Instructions:  The current medical regimen is effective;  continue present plan and medications as directed. Please refer to the Current Medication list given to you today. *If you need a refill on your cardiac medications before your next appointment, please call your pharmacy*  Follow-Up: Your next appointment:  6 month(s) Please call our office 2 months in advance to schedule this appointment In Person with You may see Quay Burow, MD or one of the following Advanced Practice Providers on your designated Care Team:  Kerin Ransom, PA-C  [place], [place]  Coletta Memos, [place], you and your health needs are our priority.  As part of our continuing mission to provide you with exceptional heart care, we have created designated Provider Care Teams.  These Care Teams include your primary Cardiologist (physician) and Advanced Practice Providers (APPs -  Physician Assistants and Nurse Practitioners) who all work together to provide you with the care you need, when you need it.
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
## 2020-03-05 ENCOUNTER — Other Ambulatory Visit: Payer: Self-pay | Admitting: Hematology

## 2020-03-05 DIAGNOSIS — C9000 Multiple myeloma not having achieved remission: Secondary | ICD-10-CM

## 2020-03-12 ENCOUNTER — Other Ambulatory Visit: Payer: Self-pay | Admitting: Hematology

## 2020-03-12 DIAGNOSIS — C9000 Multiple myeloma not having achieved remission: Secondary | ICD-10-CM

## 2020-03-14 MED FILL — NINLARO 3 MG CAPS: 3 | 28 days supply | Qty: 3 | Fill #0

## 2020-03-27 ENCOUNTER — Ambulatory Visit: Payer: Medicare HMO | Admitting: Hematology

## 2020-03-27 ENCOUNTER — Other Ambulatory Visit: Payer: Medicare HMO

## 2020-03-27 ENCOUNTER — Ambulatory Visit: Payer: Medicare HMO

## 2020-04-02 ENCOUNTER — Other Ambulatory Visit: Payer: Self-pay

## 2020-04-02 ENCOUNTER — Inpatient Hospital Stay: Payer: Medicare HMO | Admitting: Hematology

## 2020-04-02 ENCOUNTER — Inpatient Hospital Stay: Payer: Medicare HMO

## 2020-04-02 ENCOUNTER — Inpatient Hospital Stay: Payer: Medicare HMO | Attending: Hematology

## 2020-04-02 VITALS — BP 96/65 | HR 68 | Temp 97.9°F | Resp 20 | Ht 70.0 in | Wt 232.7 lb

## 2020-04-02 DIAGNOSIS — C9000 Multiple myeloma not having achieved remission: Secondary | ICD-10-CM

## 2020-04-02 DIAGNOSIS — D696 Thrombocytopenia, unspecified: Secondary | ICD-10-CM

## 2020-04-02 DIAGNOSIS — Z7189 Other specified counseling: Secondary | ICD-10-CM

## 2020-04-02 LAB — CBC WITH DIFFERENTIAL/PLATELET
Abs Immature Granulocytes: 0.02 10*3/uL (ref 0.00–0.07)
Basophils Absolute: 0 10*3/uL (ref 0.0–0.1)
Basophils Relative: 0 %
Eosinophils Absolute: 0.1 10*3/uL (ref 0.0–0.5)
Eosinophils Relative: 2 %
HCT: 37.4 % — ABNORMAL LOW (ref 39.0–52.0)
Hemoglobin: 11.1 g/dL — ABNORMAL LOW (ref 13.0–17.0)
Immature Granulocytes: 1 %
Lymphocytes Relative: 12 %
Lymphs Abs: 0.5 10*3/uL — ABNORMAL LOW (ref 0.7–4.0)
MCH: 27.9 pg (ref 26.0–34.0)
MCHC: 29.7 g/dL — ABNORMAL LOW (ref 30.0–36.0)
MCV: 94 fL (ref 80.0–100.0)
Monocytes Absolute: 0.5 10*3/uL (ref 0.1–1.0)
Monocytes Relative: 12 %
Neutro Abs: 2.9 10*3/uL (ref 1.7–7.7)
Neutrophils Relative %: 73 %
Platelets: 73 10*3/uL — ABNORMAL LOW (ref 150–400)
RBC: 3.98 MIL/uL — ABNORMAL LOW (ref 4.22–5.81)
RDW: 17.4 % — ABNORMAL HIGH (ref 11.5–15.5)
WBC: 3.9 10*3/uL — ABNORMAL LOW (ref 4.0–10.5)
nRBC: 0 % (ref 0.0–0.2)

## 2020-04-02 LAB — CMP (CANCER CENTER ONLY)
ALT: 25 U/L (ref 0–44)
AST: 32 U/L (ref 15–41)
Albumin: 3.8 g/dL (ref 3.5–5.0)
Alkaline Phosphatase: 81 U/L (ref 38–126)
Anion gap: 12 (ref 5–15)
BUN: 31 mg/dL — ABNORMAL HIGH (ref 8–23)
CO2: 24 mmol/L (ref 22–32)
Calcium: 9 mg/dL (ref 8.9–10.3)
Chloride: 109 mmol/L (ref 98–111)
Creatinine: 1.56 mg/dL — ABNORMAL HIGH (ref 0.61–1.24)
GFR, Est AFR Am: 51 mL/min — ABNORMAL LOW (ref >60)
GFR, Estimated: 44 mL/min — ABNORMAL LOW (ref >60)
Glucose, Bld: 128 mg/dL — ABNORMAL HIGH (ref 70–99)
Potassium: 4.3 mmol/L (ref 3.5–5.1)
Sodium: 145 mmol/L (ref 135–145)
Total Bilirubin: 1.1 mg/dL (ref 0.3–1.2)
Total Protein: 7.1 g/dL (ref 6.5–8.1)

## 2020-04-02 LAB — VITAMIN D 25 HYDROXY (VIT D DEFICIENCY, FRACTURES): Vit D, 25-Hydroxy: 48.87 ng/mL (ref 30–100)

## 2020-04-02 MED ORDER — SODIUM CHLORIDE 0.9 % IV SOLN
Freq: Once | INTRAVENOUS | Status: AC
  Filled 2020-04-02: qty 250

## 2020-04-02 MED ORDER — SODIUM CHLORIDE 0.9 % IV SOLN
60.0000 mg | Freq: Once | INTRAVENOUS | Status: AC
  Administered 2020-04-02: 60 mg via INTRAVENOUS
  Filled 2020-04-02: qty 10

## 2020-04-02 MED ORDER — SODIUM CHLORIDE 0.9 % IV SOLN
60.0000 mg | Freq: Once | INTRAVENOUS | Status: DC
  Filled 2020-04-02: qty 20

## 2020-04-02 NOTE — Progress Notes (Signed)
HEMATOLOGY/ONCOLOGY CLINIC NOTE  Date of Service: 04/02/20    Patient Care Team: Sandi Mariscal, MD as PCP - General (Internal Medicine) Lorretta Harp, MD as PCP - Cardiology (Cardiology)  CHIEF COMPLAINTS/PURPOSE OF CONSULTATION:   F/u for continued management of Myeloma   HISTORY OF PRESENTING ILLNESS:  plz see previous note for details on HPI  INTERVAL HISTORY   Richard Vang is here for his scheduled followup for multiple myeloma. The patient's last visit with [name] was on 01/25/2020. The pt reports that he is doing well overall.  The pt reports that he has been feeling short of breath and is following closely with his Cardiologist. Pt denies any leg swelling. His back pain is intermittent, but continues to be quite bothersome.   Lab results today (04/02/20) of CBC w/diff and CMP is as follows: all values are WNL except for WBC at 3.9K, RBC at 3.98, Hgb at 11.1, HCT at 37.4, MCHC at 29.7, RDW at 17.4, PLT at 73K, Lymphs Abs at 0.5K, Glucose at 128, BUN at 31, Creatinine at 1.56, GFR Est Non Af Am at 44. 04/02/2020 Vitamin D 25 (OH) at 48.87 04/02/2020 K/L light chains stable  On review of systems, pt reports SOB, chronic back pain, abdominal pain and denies leg swelling, fevers, chills, night sweats, abnormal/excessive bleeding, worsening neuropathy, constipation, diarrhea and any other symptoms.    MEDICAL HISTORY:  Past Medical History:  Diagnosis Date  . Arthritis    "left shoulder" (07/31/2016)  . Coronary artery disease   . GERD (gastroesophageal reflux disease)   . Heart murmur   . High cholesterol   . Hypertension   . Multiple myeloma ([place])   . Sleep apnea    "probably; having test in November" (07/31/2016)  . Type II diabetes mellitus ([place])     SURGICAL HISTORY: Past Surgical History:  Procedure Laterality Date  . CARDIAC CATHETERIZATION N/A 07/31/2016   Procedure: Left Heart Cath and Coronary Angiography;  Surgeon: Lorretta Harp, MD;  Location: [facility];  Service: Cardiovascular;  Laterality: N/A;  . CARDIAC CATHETERIZATION N/A 07/31/2016   Procedure: Coronary Stent Intervention;  Surgeon: Lorretta Harp, MD;  Location: [facility];  Service: Cardiovascular;  Laterality: N/A;  . CORONARY ANGIOPLASTY    . OPEN REDUCTION INTERNAL FIXATION (ORIF) METACARPAL Left 05/21/2018   Procedure: OPEN REDUCTION INTERNAL FIXATION (ORIF) 2ND METACARPAL;  Surgeon: Shona Needles, MD;  Location: [facility];  Service: Orthopedics;  Laterality: Left;  . ORIF CLAVICULAR FRACTURE Right 05/21/2018   Procedure: OPEN REDUCTION INTERNAL FIXATION (ORIF) CLAVICULAR FRACTURE;  Surgeon: Shona Needles, MD;  Location: [facility];  Service: Orthopedics;  Laterality: Right;  . SHOULDER SURGERY Left 1973   "put pin in it where it had separated"   . TONSILLECTOMY  ~ 1956    SOCIAL HISTORY: Social History   Socioeconomic History  . Marital status: Divorced    Spouse name: Not on file  . Number of children: Not on file  . Years of education: Not on file  . Highest education level: Not on file  Occupational History  . Not on file  Tobacco Use  . Smoking status: Never Smoker  . Smokeless tobacco: Never Used  Vaping Use  . Vaping Use: Never used  Substance and Sexual Activity  . Alcohol use: Yes    Comment: 07/31/2016 "nothing since 2002"  . Drug use: No  . Sexual activity: Not Currently  Other Topics Concern  .  Not on file  Social History Narrative  . Not on file   Social Determinants of Health   Financial Resource Strain:   . Difficulty of Paying Living Expenses:   Food Insecurity:   . Worried About Charity fundraiser in the Last Year:   . Arboriculturist in the Last Year:   Transportation Needs:   . Film/video editor (Medical):   [name] Lack of Transportation (Non-Medical):   Physical Activity:   . Days of Exercise per Week:   . Minutes of Exercise per Session:   Stress:   . Feeling of Stress :   Social Connections:   . Frequency of  Communication with Friends and Family:   . Frequency of Social Gatherings with Friends and Family:   . Attends Religious Services:   . Active Member of Clubs or Organizations:   . Attends Archivist Meetings:   [name] Marital Status:   Intimate Partner Violence:   . Fear of Current or Ex-Partner:   . Emotionally Abused:   [name] Physically Abused:   . Sexually Abused:     FAMILY HISTORY: Family History  Problem Relation Age of Onset  . Hypertension Other     ALLERGIES:  has No Known Allergies.  MEDICATIONS:  Current Outpatient Medications  Medication Sig Dispense Refill  . acyclovir (ZOVIRAX) 400 MG tablet Take 1 tablet by mouth twice daily 60 tablet 0  . amLODipine (NORVASC) 5 MG tablet Take 5 mg by mouth daily.    [name] atorvastatin (LIPITOR) 40 MG tablet Take 1 tablet (40 mg total) by mouth at bedtime. 30 tablet 12  . carvedilol (COREG) 12.5 MG tablet Take 12.5 mg by mouth 2 (two) times daily with a meal.    . clopidogrel (PLAVIX) 75 MG tablet Take 1 tablet (75 mg total) by mouth daily with breakfast. 30 tablet 12  . cyclobenzaprine (FLEXERIL) 10 MG tablet Take 10 mg by mouth daily as needed for muscle spasms.   2  . escitalopram (LEXAPRO) 10 MG tablet Take 10 mg by mouth daily.    . furosemide (LASIX) 40 MG tablet Take 40 mg by mouth.    . montelukast (SINGULAIR) 10 MG tablet Take 10 mg by mouth daily.    [name] NINLARO 3 MG capsule TAKE 1 CAPSULE (3 MG TOTAL) BY MOUTH ONCE A WEEK. ON DAY 1,8,15 EVERY 28 DAYS. TAKE ON AN EMPTY STOMACH 1HR BEFORE OR 2HRS AFTER FOOD. 3 capsule 1  . omeprazole (PRILOSEC) 40 MG capsule Take 40 mg by mouth daily with breakfast.     . oxyCODONE-acetaminophen (PERCOCET) 10-325 MG tablet Take 1 tablet by mouth 2 (two) times daily. 10 tablet 0  . Potassium Chloride CR (MICRO-K) 8 MEQ CPCR capsule CR Take 8 mEq by mouth daily.    . SYMBICORT 160-4.5 MCG/ACT inhaler Inhale 2 puffs into the lungs 2 (two) times daily as needed (shortness of breath).     . Vitamin  D, Ergocalciferol, (DRISDOL) 1.25 MG (50000 UNIT) CAPS capsule Take 1 capsule by mouth once a week 12 capsule 0  . warfarin (COUMADIN) 5 MG tablet Take 5 mg by mouth daily.     No current facility-administered medications for this visit.   Facility-Administered Medications Ordered in Other Visits  Medication Dose Route Frequency Provider Last Rate Last Admin  . 0.9 %  sodium chloride infusion   Intravenous Continuous Brunetta Genera, MD 10 mL/hr at 07/09/17 1423 New Bag at 07/09/17 1423  . pamidronate (AREDIA)  60 mg in sodium chloride 0.9 % 500 mL IVPB  60 mg Intravenous Once Brunetta Genera, MD 127.5 mL/hr at 04/02/20 1154 60 mg at 04/02/20 1154    REVIEW OF SYSTEMS:   A 10+ POINT REVIEW OF SYSTEMS WAS OBTAINED including neurology, dermatology, psychiatry, cardiac, respiratory, lymph, extremities, GI, GU, Musculoskeletal, constitutional, breasts, reproductive, HEENT.  All pertinent positives are noted in the HPI.  All others are negative.   PHYSICAL EXAMINATION:  ECOG FS:2 - Symptomatic, <50% confined to bed  Vitals:   04/02/20 0958  BP: 96/65  Pulse: 68  Resp: 20  Temp: 97.9 F (36.6 C)  SpO2: 100%   Wt Readings from Last 3 Encounters:  04/02/20 232 lb 11.2 oz (105.6 kg)  02/23/20 227 lb 12.8 oz (103.3 kg)  01/25/20 231 lb 14.4 oz (105.2 kg)   Body mass index is 33.39 kg/m.    Exam was given in a chair   GENERAL:alert, in no acute distress and comfortable SKIN: no acute rashes, no significant lesions EYES: conjunctiva are pink and non-injected, sclera anicteric OROPHARYNX: MMM, no exudates, no oropharyngeal erythema or ulceration NECK: supple, no JVD LYMPH:  no palpable lymphadenopathy in the cervical, axillary or inguinal regions LUNGS: clear to auscultation b/l with normal respiratory effort HEART: regular rate & rhythm ABDOMEN:  normoactive bowel sounds , non tender, not distended. No palpable hepatosplenomegaly.  Extremity: no pedal edema PSYCH: alert &  oriented x 3 with fluent speech NEURO: no focal motor/sensory deficits  LABORATORY DATA:  I have reviewed the data as listed . CBC Latest Ref Rng & Units 04/02/2020 01/25/2020 11/25/2019  WBC 4.0 - 10.5 K/uL 3.9(L) 5.6 5.4  Hemoglobin 13.0 - 17.0 g/dL 11.1(L) 11.3(L) 12.3(L)  Hematocrit 39 - 52 % 37.4(L) 38.0(L) 40.4  Platelets 150 - 400 K/uL 73(L) 147(L) 122(L)   . CBC    Component Value Date/Time   WBC 3.9 (L) 04/02/2020 0851   RBC 3.98 (L) 04/02/2020 0851   HGB 11.1 (L) 04/02/2020 0851   HGB 11.3 (L) 01/25/2020 1000   HGB 9.2 (L) 10/01/2017 1112   HCT 37.4 (L) 04/02/2020 0851   HCT 29.3 (L) 10/01/2017 1112   PLT 73 (L) 04/02/2020 0851   PLT 147 (L) 01/25/2020 1000   PLT 170 10/01/2017 1112   MCV 94.0 04/02/2020 0851   MCV 88.5 10/01/2017 1112   MCH 27.9 04/02/2020 0851   MCHC 29.7 (L) 04/02/2020 0851   RDW 17.4 (H) 04/02/2020 0851   RDW 15.3 (H) 10/01/2017 1112   LYMPHSABS 0.5 (L) 04/02/2020 0851   LYMPHSABS 0.5 (L) 10/01/2017 1112   MONOABS 0.5 04/02/2020 0851   MONOABS 0.5 10/01/2017 1112   EOSABS 0.1 04/02/2020 0851   EOSABS 0.1 10/01/2017 1112   BASOSABS 0.0 04/02/2020 0851   BASOSABS 0.0 10/01/2017 1112    . CMP Latest Ref Rng & Units 04/02/2020 01/25/2020 11/25/2019  Glucose 70 - 99 mg/dL 128(H) 121(H) 106(H)  BUN 8 - 23 mg/dL 31(H) 32(H) 36(H)  Creatinine 0.61 - 1.24 mg/dL 1.56(H) 1.46(H) 1.49(H)  Sodium 135 - 145 mmol/L 145 146(H) 144  Potassium 3.5 - 5.1 mmol/L 4.3 3.9 4.7  Chloride 98 - 111 mmol/L 109 110 110  CO2 22 - 32 mmol/L '24 26 25  ' Calcium 8.9 - 10.3 mg/dL 9.0 9.1 9.0  Total Protein 6.5 - 8.1 g/dL 7.1 7.0 7.1  Total Bilirubin 0.3 - 1.2 mg/dL 1.1 0.9 0.8  Alkaline Phos 38 - 126 U/L 81 83 86  AST 15 -  41 U/L 32 30 32  ALT 0 - 44 U/L '25 22 27   ' 03/18/2019 MMP: Component     Latest Ref Rng & Units 03/18/2019  IgG (Immunoglobin G), Serum     603 - 1,613 mg/dL 1,012  IgA     61 - 437 mg/dL 51 (L)  IgM (Immunoglobulin M), Srm     15 - 143 mg/dL 27    Total Protein ELP     6.0 - 8.5 g/dL 6.3  Albumin SerPl Elph-Mcnc     2.9 - 4.4 g/dL 3.8  Alpha 1     0.0 - 0.4 g/dL 0.2  Alpha2 Glob SerPl Elph-Mcnc     0.4 - 1.0 g/dL 0.7  B-Globulin SerPl Elph-Mcnc     0.7 - 1.3 g/dL 0.8  Gamma Glob SerPl Elph-Mcnc     0.4 - 1.8 g/dL 0.8  M Protein SerPl Elph-Mcnc     Not Observed g/dL Not Observed  Globulin, Total     2.2 - 3.9 g/dL 2.5  Albumin/Glob SerPl     0.7 - 1.7 1.6  IFE 1      Comment  Please Note ([facility]):      Comment    RADIOGRAPHIC STUDIES: I have personally reviewed the radiological images as listed and agreed with the findings in the report. No results found.  ASSESSMENT & PLAN:   74 y.o. 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  K/L 95, No M spike on SPEP -- suggests light chain MM Cytogenetics - normal male chromosomes FISH- +11 and 13q-/-13  Patient is s/p 1 cycle of Vd and Serum free kappa LC has decreased from 700 to 203.6 with improvement in his K/L ratio from 94.59 to 29. Completed 2nd cycle of treatment with Vd + Cytoxan (270m/m2) Completed 3rd cycle of treatment with Vd + Cytoxan (2035mm2) - cytoxan held D15 due to thrombocytopenia Completed 4th of VCd Discontinued VCd after C5D8 due to intolerance with diarrhea and increasing neuropathy and fatigue with borderline functional status. -Patient did not tolerate maintenance Revlimid 10 mg by mouth daily due to grade 2-3 diarrhea. This was  discontinued and his diarrhea has resolved.  Currently on maintenance Ninlaro M spike absent   11/15/18 CT Pelvic revealed  3 mm stone in the right UVJ. 5 x 6 mm stone is identified in the left UVJ. No associated distal hydroureter. 2. Stable lucent lesion in the right iliac bone. 3. Small right groin hernia contains only fat. 11/15/18 CT Lumbar revealed  No acute fracture or malalignment. 2. Severe osteopenia and multiple old compression fractures, severe at T11 and T12. 3. Severe canal stenosis L3-4. Multilevel neural foraminal narrowing: Moderate to severe at T12-L1. 4. Slowly enlarging solid LEFT renal mass. Recommend non emergent MRI renal mass protocol on non emergent basis -No obvious new myeloma involvement seen in most recent CT imaging.         2) CAD s/p prox LAD DES PCI on 07/31/2016 with ischemic cardiomyopathy ejection fraction of 25-35%. Rpt ECHO on 10/03/2016 shows improvement in EF to 55-60%  with no RWMABN.  3) Macrocytic Anemia with moderate thrombocytopenia - due to MM and and Ninlaro. Stable.  4) Thrombocytopenia - due to MM and treatment --improved today 108k. --will monitor  5) B12 deficiency - Plan -cont SL B12 1049mg po daily  6) h/o Afib with RVR on coumadin Some bradycardia with BB -continue mx per PCP and cardiology  7) Back pain -- multiple chronic compression fractures with some worsening -On bisphosphonates already -Ergocalciferol 50k units weekly -Advised that the pt not lift anything heavy  -Discussed the 11/15/18 CT imaging, as noted above, did refer pt to orthopedist -Pt will be following up with orthopedist in December 2020  PLAN:  -Discussed pt labwork today, 04/02/20; Hgb & WBC are stable, PLT is low, Kidney function is stable, Vitamin D 25 (OH) is WNL, K/L light chains is in progress -Discussed 01/25/2020 MMP shows M Protein "Not observed" -Discussed 01/25/2020 K/L light chains is stable - K/L light chain ratio at 3.03 -The pt has no  prohibitive toxicities from continuing Ninlaro. 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 -The pt shows no lab or clinical evidence of myeloma progression at this time. -Will continue to monitor PLT -Continue Aredia every 8 weeks -Continue weekly Ergocalciferol  -Continue to f/u with Dr. BGwenlyn Foundfor SOB evaluation -Will see back in 2 months, sooner if any new concerns    FOLLOW UP: -Plz schedule next 2 doses of Pamidronate, labs and MD visit every 2 months    The total time spent in the appt was 20 minutes and more than 50% was on counseling and direct patient cares.  All of the patient's questions were answered with apparent satisfaction. The patient knows to call the clinic with any problems, questions or concerns.   GSullivan LoneMD MLocust ForkAAHIVMS SEncompass Health Emerald Coast Rehabilitation Of Panama CityCWest Feliciana Parish HospitalHematology/Oncology Physician CBeaumont Hospital [facility]born (Office):       [phone](Work cell):  [phone](Fax):           [phone] I, JYevette Edwards am acting as a scribe for Dr. GSullivan Lone   .I have reviewed the above documentation for accuracy and completeness, and I agree with the above. .Brunetta GeneraMD

## 2020-04-02 NOTE — Patient Instructions (Signed)
Pamidronate injection What is this medicine? PAMIDRONATE (pa mi DROE nate) slows calcium loss from bones. It is used to treat high calcium blood levels from cancer or Paget's disease. It is also used to treat bone pain and prevent fractures from certain cancers that have spread to the bone. This medicine may be used for other purposes; ask your health care provider or pharmacist if you have questions. COMMON BRAND NAME(S): Aredia What should I tell my health care provider before I take this medicine? They need to know if you have any of these conditions:  aspirin-sensitive asthma  dental disease  kidney disease  an unusual or allergic reaction to pamidronate, other medicines, foods, dyes, or preservatives  pregnant or trying to get pregnant  breast-feeding How should I use this medicine? This medicine is for infusion into a vein. It is given by a health care professional in a hospital or clinic setting. Talk to your pediatrician regarding the use of this medicine in children. This medicine is not approved for use in children. Overdosage: If you think you have taken too much of this medicine contact a poison control center or emergency room at once. NOTE: This medicine is only for you. Do not share this medicine with others. What if I miss a dose? This does not apply. What may interact with this medicine?  certain antibiotics given by injection  medicines for inflammation or pain like ibuprofen, naproxen  some diuretics like bumetanide, furosemide  cyclosporine  parathyroid hormone  tacrolimus  teriparatide  thalidomide This list may not describe all possible interactions. Give your health care provider a list of all the medicines, herbs, non-prescription drugs, or dietary supplements you use. Also tell them if you smoke, drink alcohol, or use illegal drugs. Some items may interact with your medicine. What should I watch for while using this medicine? Visit your doctor or  health care professional for regular checkups. It may be some time before you see the benefit from this medicine. Do not stop taking your medicine unless your doctor tells you to. Your doctor may order blood tests or other tests to see how you are doing. Women should inform their doctor if they wish to become pregnant or think they might be pregnant. There is a potential for serious side effects to an unborn child. Talk to your health care professional or pharmacist for more information. You should make sure that you get enough calcium and vitamin D while you are taking this medicine. Discuss the foods you eat and the vitamins you take with your health care professional. Some people who take this medicine have severe bone, joint, and/or muscle pain. This medicine may also increase your risk for a broken thigh bone. Tell your doctor right away if you have pain in your upper leg or groin. Tell your doctor if you have any pain that does not go away or that gets worse. What side effects may I notice from receiving this medicine? Side effects that you should report to your doctor or health care professional as soon as possible:  allergic reactions like skin rash, itching or hives, swelling of the face, lips, or tongue  black or tarry stools  changes in vision  eye inflammation, pain  high blood pressure  jaw pain, especially burning or cramping  muscle weakness  numb, tingling pain  swelling of feet or hands  trouble passing urine or change in the amount of urine  unable to move easily Side effects that usually do not  require medical attention (report to your doctor or health care professional if they continue or are bothersome):  bone, joint, or muscle pain  constipation  dizzy, drowsy  fever  headache  loss of appetite  nausea, vomiting  pain at site where injected This list may not describe all possible side effects. Call your doctor for medical advice about side effects.  You may report side effects to FDA at 1-800-FDA-1088. Where should I keep my medicine? This drug is given in a hospital or clinic and will not be stored at home. NOTE: This sheet is a summary. It may not cover all possible information. If you have questions about this medicine, talk to your doctor, pharmacist, or health care provider.  2020 Elsevier/Gold Standard (2011-04-04 08:49:49)  

## 2020-04-03 LAB — KAPPA/LAMBDA LIGHT CHAINS
Kappa free light chain: 73.1 mg/L — ABNORMAL HIGH (ref 3.3–19.4)
Kappa, lambda light chain ratio: 3.93 — ABNORMAL HIGH (ref 0.26–1.65)
Lambda free light chains: 18.6 mg/L (ref 5.7–26.3)

## 2020-04-05 ENCOUNTER — Other Ambulatory Visit: Payer: Self-pay | Admitting: Hematology

## 2020-04-05 DIAGNOSIS — C9000 Multiple myeloma not having achieved remission: Secondary | ICD-10-CM

## 2020-04-12 MED FILL — NINLARO 3 MG CAPS: 3 | 28 days supply | Qty: 3 | Fill #1

## 2020-05-08 ENCOUNTER — Other Ambulatory Visit: Payer: Self-pay | Admitting: Hematology

## 2020-05-08 DIAGNOSIS — C9000 Multiple myeloma not having achieved remission: Secondary | ICD-10-CM

## 2020-05-09 ENCOUNTER — Other Ambulatory Visit: Payer: Self-pay | Admitting: Hematology

## 2020-05-09 DIAGNOSIS — C9000 Multiple myeloma not having achieved remission: Secondary | ICD-10-CM

## 2020-05-09 MED FILL — NINLARO 3 MG CAPS: 3 | 28 days supply | Qty: 3 | Fill #0

## 2020-05-25 ENCOUNTER — Other Ambulatory Visit: Payer: Self-pay | Admitting: Internal Medicine

## 2020-05-25 DIAGNOSIS — N183 Chronic kidney disease, stage 3 unspecified: Secondary | ICD-10-CM

## 2020-05-28 ENCOUNTER — Other Ambulatory Visit: Payer: Self-pay | Admitting: Hematology

## 2020-05-28 DIAGNOSIS — C9000 Multiple myeloma not having achieved remission: Secondary | ICD-10-CM

## 2020-05-28 NOTE — Progress Notes (Unsigned)
Cbc

## 2020-05-29 ENCOUNTER — Inpatient Hospital Stay: Payer: Medicare HMO | Admitting: Hematology

## 2020-05-29 ENCOUNTER — Inpatient Hospital Stay: Payer: Medicare HMO | Attending: Hematology

## 2020-05-29 ENCOUNTER — Other Ambulatory Visit: Payer: Self-pay

## 2020-05-29 ENCOUNTER — Other Ambulatory Visit: Payer: Medicare HMO

## 2020-05-29 ENCOUNTER — Ambulatory Visit: Payer: Medicare HMO | Admitting: Hematology and Oncology

## 2020-05-29 ENCOUNTER — Inpatient Hospital Stay: Payer: Medicare HMO

## 2020-05-29 ENCOUNTER — Ambulatory Visit: Payer: Medicare HMO

## 2020-05-29 VITALS — BP 124/56 | HR 72 | Temp 97.1°F | Resp 18 | Ht 70.0 in | Wt 228.8 lb

## 2020-05-29 DIAGNOSIS — I4891 Unspecified atrial fibrillation: Secondary | ICD-10-CM | POA: Diagnosis not present

## 2020-05-29 DIAGNOSIS — D696 Thrombocytopenia, unspecified: Secondary | ICD-10-CM | POA: Diagnosis not present

## 2020-05-29 DIAGNOSIS — I10 Essential (primary) hypertension: Secondary | ICD-10-CM | POA: Insufficient documentation

## 2020-05-29 DIAGNOSIS — D539 Nutritional anemia, unspecified: Secondary | ICD-10-CM | POA: Diagnosis not present

## 2020-05-29 DIAGNOSIS — M8580 Other specified disorders of bone density and structure, unspecified site: Secondary | ICD-10-CM | POA: Diagnosis not present

## 2020-05-29 DIAGNOSIS — E1140 Type 2 diabetes mellitus with diabetic neuropathy, unspecified: Secondary | ICD-10-CM | POA: Diagnosis not present

## 2020-05-29 DIAGNOSIS — E538 Deficiency of other specified B group vitamins: Secondary | ICD-10-CM | POA: Insufficient documentation

## 2020-05-29 DIAGNOSIS — G4730 Sleep apnea, unspecified: Secondary | ICD-10-CM | POA: Diagnosis not present

## 2020-05-29 DIAGNOSIS — Z8249 Family history of ischemic heart disease and other diseases of the circulatory system: Secondary | ICD-10-CM | POA: Diagnosis not present

## 2020-05-29 DIAGNOSIS — I2510 Atherosclerotic heart disease of native coronary artery without angina pectoris: Secondary | ICD-10-CM | POA: Diagnosis not present

## 2020-05-29 DIAGNOSIS — E7800 Pure hypercholesterolemia, unspecified: Secondary | ICD-10-CM | POA: Insufficient documentation

## 2020-05-29 DIAGNOSIS — D6959 Other secondary thrombocytopenia: Secondary | ICD-10-CM | POA: Insufficient documentation

## 2020-05-29 DIAGNOSIS — C9000 Multiple myeloma not having achieved remission: Secondary | ICD-10-CM

## 2020-05-29 DIAGNOSIS — Z7189 Other specified counseling: Secondary | ICD-10-CM

## 2020-05-29 DIAGNOSIS — K219 Gastro-esophageal reflux disease without esophagitis: Secondary | ICD-10-CM | POA: Diagnosis not present

## 2020-05-29 DIAGNOSIS — Z79899 Other long term (current) drug therapy: Secondary | ICD-10-CM | POA: Insufficient documentation

## 2020-05-29 DIAGNOSIS — Z7901 Long term (current) use of anticoagulants: Secondary | ICD-10-CM | POA: Insufficient documentation

## 2020-05-29 DIAGNOSIS — E785 Hyperlipidemia, unspecified: Secondary | ICD-10-CM | POA: Diagnosis not present

## 2020-05-29 DIAGNOSIS — Z7951 Long term (current) use of inhaled steroids: Secondary | ICD-10-CM | POA: Diagnosis not present

## 2020-05-29 LAB — CMP (CANCER CENTER ONLY)
ALT: 13 U/L (ref 0–44)
AST: 24 U/L (ref 15–41)
Albumin: 3.7 g/dL (ref 3.5–5.0)
Alkaline Phosphatase: 81 U/L (ref 38–126)
Anion gap: 10 (ref 5–15)
BUN: 34 mg/dL — ABNORMAL HIGH (ref 8–23)
CO2: 23 mmol/L (ref 22–32)
Calcium: 9.5 mg/dL (ref 8.9–10.3)
Chloride: 110 mmol/L (ref 98–111)
Creatinine: 1.72 mg/dL — ABNORMAL HIGH (ref 0.61–1.24)
GFR, Est AFR Am: 45 mL/min — ABNORMAL LOW (ref >60)
GFR, Estimated: 39 mL/min — ABNORMAL LOW (ref >60)
Glucose, Bld: 104 mg/dL — ABNORMAL HIGH (ref 70–99)
Potassium: 4.1 mmol/L (ref 3.5–5.1)
Sodium: 143 mmol/L (ref 135–145)
Total Bilirubin: 0.9 mg/dL (ref 0.3–1.2)
Total Protein: 7.1 g/dL (ref 6.5–8.1)

## 2020-05-29 LAB — CBC WITH DIFFERENTIAL/PLATELET
Abs Immature Granulocytes: 0.03 10*3/uL (ref 0.00–0.07)
Basophils Absolute: 0 10*3/uL (ref 0.0–0.1)
Basophils Relative: 1 %
Eosinophils Absolute: 0.1 10*3/uL (ref 0.0–0.5)
Eosinophils Relative: 2 %
HCT: 37.6 % — ABNORMAL LOW (ref 39.0–52.0)
Hemoglobin: 11.4 g/dL — ABNORMAL LOW (ref 13.0–17.0)
Immature Granulocytes: 1 %
Lymphocytes Relative: 15 %
Lymphs Abs: 0.5 10*3/uL — ABNORMAL LOW (ref 0.7–4.0)
MCH: 27.7 pg (ref 26.0–34.0)
MCHC: 30.3 g/dL (ref 30.0–36.0)
MCV: 91.5 fL (ref 80.0–100.0)
Monocytes Absolute: 0.4 10*3/uL (ref 0.1–1.0)
Monocytes Relative: 11 %
Neutro Abs: 2.5 10*3/uL (ref 1.7–7.7)
Neutrophils Relative %: 70 %
Platelets: 86 10*3/uL — ABNORMAL LOW (ref 150–400)
RBC: 4.11 MIL/uL — ABNORMAL LOW (ref 4.22–5.81)
RDW: 17.2 % — ABNORMAL HIGH (ref 11.5–15.5)
WBC: 3.5 10*3/uL — ABNORMAL LOW (ref 4.0–10.5)
nRBC: 0 % (ref 0.0–0.2)

## 2020-05-29 LAB — VITAMIN D 25 HYDROXY (VIT D DEFICIENCY, FRACTURES): Vit D, 25-Hydroxy: 41.58 ng/mL (ref 30–100)

## 2020-05-29 MED ORDER — SODIUM CHLORIDE 0.9 % IV SOLN
60.0000 mg | Freq: Once | INTRAVENOUS | Status: AC
  Administered 2020-05-29: 60 mg via INTRAVENOUS
  Filled 2020-05-29: qty 10

## 2020-05-29 MED ORDER — SODIUM CHLORIDE 0.9 % IV SOLN
Freq: Once | INTRAVENOUS | Status: AC
  Filled 2020-05-29: qty 250

## 2020-05-29 NOTE — Patient Instructions (Signed)
Pamidronate injection What is this medicine? PAMIDRONATE (pa mi DROE nate) slows calcium loss from bones. It is used to treat high calcium blood levels from cancer or Paget's disease. It is also used to treat bone pain and prevent fractures from certain cancers that have spread to the bone. This medicine may be used for other purposes; ask your health care provider or pharmacist if you have questions. COMMON BRAND NAME(S): Aredia What should I tell my health care provider before I take this medicine? They need to know if you have any of these conditions:  aspirin-sensitive asthma  dental disease  kidney disease  an unusual or allergic reaction to pamidronate, other medicines, foods, dyes, or preservatives  pregnant or trying to get pregnant  breast-feeding How should I use this medicine? This medicine is for infusion into a vein. It is given by a health care professional in a hospital or clinic setting. Talk to your pediatrician regarding the use of this medicine in children. This medicine is not approved for use in children. Overdosage: If you think you have taken too much of this medicine contact a poison control center or emergency room at once. NOTE: This medicine is only for you. Do not share this medicine with others. What if I miss a dose? This does not apply. What may interact with this medicine?  certain antibiotics given by injection  medicines for inflammation or pain like ibuprofen, naproxen  some diuretics like bumetanide, furosemide  cyclosporine  parathyroid hormone  tacrolimus  teriparatide  thalidomide This list may not describe all possible interactions. Give your health care provider a list of all the medicines, herbs, non-prescription drugs, or dietary supplements you use. Also tell them if you smoke, drink alcohol, or use illegal drugs. Some items may interact with your medicine. What should I watch for while using this medicine? Visit your doctor or  health care professional for regular checkups. It may be some time before you see the benefit from this medicine. Do not stop taking your medicine unless your doctor tells you to. Your doctor may order blood tests or other tests to see how you are doing. Women should inform their doctor if they wish to become pregnant or think they might be pregnant. There is a potential for serious side effects to an unborn child. Talk to your health care professional or pharmacist for more information. You should make sure that you get enough calcium and vitamin D while you are taking this medicine. Discuss the foods you eat and the vitamins you take with your health care professional. Some people who take this medicine have severe bone, joint, and/or muscle pain. This medicine may also increase your risk for a broken thigh bone. Tell your doctor right away if you have pain in your upper leg or groin. Tell your doctor if you have any pain that does not go away or that gets worse. What side effects may I notice from receiving this medicine? Side effects that you should report to your doctor or health care professional as soon as possible:  allergic reactions like skin rash, itching or hives, swelling of the face, lips, or tongue  black or tarry stools  changes in vision  eye inflammation, pain  high blood pressure  jaw pain, especially burning or cramping  muscle weakness  numb, tingling pain  swelling of feet or hands  trouble passing urine or change in the amount of urine  unable to move easily Side effects that usually do not  require medical attention (report to your doctor or health care professional if they continue or are bothersome):  bone, joint, or muscle pain  constipation  dizzy, drowsy  fever  headache  loss of appetite  nausea, vomiting  pain at site where injected This list may not describe all possible side effects. Call your doctor for medical advice about side effects.  You may report side effects to FDA at 1-800-FDA-1088. Where should I keep my medicine? This drug is given in a hospital or clinic and will not be stored at home. NOTE: This sheet is a summary. It may not cover all possible information. If you have questions about this medicine, talk to your doctor, pharmacist, or health care provider.  2020 Elsevier/Gold Standard (2011-04-04 08:49:49)  

## 2020-05-29 NOTE — Progress Notes (Signed)
Verbal order per Dr. Irene Limbo: Madaline Brilliant to receive pamidronate today with Serum Creatinine 1.72 and Plt 86

## 2020-05-29 NOTE — Progress Notes (Signed)
HEMATOLOGY/ONCOLOGY CLINIC NOTE  Date of Service: 05/29/20    Patient Care Team: Sandi Mariscal, MD as PCP - General (Internal Medicine) Lorretta Harp, MD as PCP - Cardiology (Cardiology)  CHIEF COMPLAINTS/PURPOSE OF CONSULTATION:   F/u for continued management of Myeloma   HISTORY OF PRESENTING ILLNESS:  plz see previous note for details on HPI  INTERVAL HISTORY   [name] is here for his scheduled followup for multiple myeloma. The patient's last visit with [name] was on 04/02/2020. The pt reports that he is doing well overall.  The pt reports that his neuropathy has been stable and denies leg swelling. He continues to walk regularly using the assistance of his walker. He often becomes short of breath while walking. Pt is planning on following with Cardiology later this month and is monitoring his dietary sodium intake. Pt has no issues taking Ninlaro at this time.   Lab results today (05/29/20) of CBC w/diff and CMP is as follows: all values are WNL except for WBC at 3.5K, RBC at 4.11, Hgb at 11.4, HCT at 37.6, RDW at 17.2, PLT at 86K, Lymphs Abs at 0.5K, Glucose at 104, BUN at 34, Creatinine at 1.72, GFR Est Non Af Am at 39. 05/29/2020 Vitamin D 25 (OH) at 41.58 05/29/2020 K/L light chains ratio WNL  On review of systems, pt reports neuropathy, back pain, SOB and denies rash, fevers, chills, night sweats, leg swelling, constipation and any other symptoms.    MEDICAL HISTORY:  Past Medical History:  Diagnosis Date  . Arthritis    "left shoulder" (07/31/2016)  . Coronary artery disease   . GERD (gastroesophageal reflux disease)   . Heart murmur   . High cholesterol   . Hypertension   . Multiple myeloma ([place])   . Sleep apnea    "probably; having test in November" (07/31/2016)  . Type II diabetes mellitus ([place])     SURGICAL HISTORY: Past Surgical History:  Procedure Laterality Date  . CARDIAC CATHETERIZATION N/A 07/31/2016   Procedure: Left Heart Cath and Coronary  Angiography;  Surgeon: Lorretta Harp, MD;  Location: [facility];  Service: Cardiovascular;  Laterality: N/A;  . CARDIAC CATHETERIZATION N/A 07/31/2016   Procedure: Coronary Stent Intervention;  Surgeon: Lorretta Harp, MD;  Location: [facility];  Service: Cardiovascular;  Laterality: N/A;  . CORONARY ANGIOPLASTY    . OPEN REDUCTION INTERNAL FIXATION (ORIF) METACARPAL Left 05/21/2018   Procedure: OPEN REDUCTION INTERNAL FIXATION (ORIF) 2ND METACARPAL;  Surgeon: Shona Needles, MD;  Location: [facility];  Service: Orthopedics;  Laterality: Left;  . ORIF CLAVICULAR FRACTURE Right 05/21/2018   Procedure: OPEN REDUCTION INTERNAL FIXATION (ORIF) CLAVICULAR FRACTURE;  Surgeon: Shona Needles, MD;  Location: [facility];  Service: Orthopedics;  Laterality: Right;  . SHOULDER SURGERY Left 1973   "put pin in it where it had separated"   . TONSILLECTOMY  ~ 1956    SOCIAL HISTORY: Social History   Socioeconomic History  . Marital status: Divorced    Spouse name: Not on file  . Number of children: Not on file  . Years of education: Not on file  . Highest education level: Not on file  Occupational History  . Not on file  Tobacco Use  . Smoking status: Never Smoker  . Smokeless tobacco: Never Used  Vaping Use  . Vaping Use: Never used  Substance and Sexual Activity  . Alcohol use: Yes    Comment: 07/31/2016 "nothing since 2002"  .  Drug use: No  . Sexual activity: Not Currently  Other Topics Concern  . Not on file  Social History Narrative  . Not on file   Social Determinants of Health   Financial Resource Strain:   . Difficulty of Paying Living Expenses:   Food Insecurity:   . Worried About Charity fundraiser in the Last Year:   . Arboriculturist in the Last Year:   Transportation Needs:   . Film/video editor (Medical):   [name] Lack of Transportation (Non-Medical):   Physical Activity:   . Days of Exercise per Week:   . Minutes of Exercise per Session:   Stress:   .  Feeling of Stress :   Social Connections:   . Frequency of Communication with Friends and Family:   . Frequency of Social Gatherings with Friends and Family:   . Attends Religious Services:   . Active Member of Clubs or Organizations:   . Attends Archivist Meetings:   [name] Marital Status:   Intimate Partner Violence:   . Fear of Current or Ex-Partner:   . Emotionally Abused:   [name] Physically Abused:   . Sexually Abused:     FAMILY HISTORY: Family History  Problem Relation Age of Onset  . Hypertension Other     ALLERGIES:  has No Known Allergies.  MEDICATIONS:  Current Outpatient Medications  Medication Sig Dispense Refill  . acyclovir (ZOVIRAX) 400 MG tablet Take 1 tablet by mouth twice daily 60 tablet 0  . amLODipine (NORVASC) 5 MG tablet Take 5 mg by mouth daily.    [name] atorvastatin (LIPITOR) 40 MG tablet Take 1 tablet (40 mg total) by mouth at bedtime. 30 tablet 12  . carvedilol (COREG) 12.5 MG tablet Take 12.5 mg by mouth 2 (two) times daily with a meal.    . clopidogrel (PLAVIX) 75 MG tablet Take 1 tablet (75 mg total) by mouth daily with breakfast. 30 tablet 12  . cyclobenzaprine (FLEXERIL) 10 MG tablet Take 10 mg by mouth daily as needed for muscle spasms.   2  . escitalopram (LEXAPRO) 10 MG tablet Take 10 mg by mouth daily.    . furosemide (LASIX) 40 MG tablet Take 40 mg by mouth.    . montelukast (SINGULAIR) 10 MG tablet Take 10 mg by mouth daily.    [name] NINLARO 3 MG capsule TAKE 1 CAPSULE (3 MG TOTAL) BY MOUTH ONCE A WEEK. ON DAY 1,8,15 EVERY 28 DAYS. TAKE ON AN EMPTY STOMACH 1HR BEFORE OR 2HRS AFTER FOOD. 3 capsule 1  . omeprazole (PRILOSEC) 40 MG capsule Take 40 mg by mouth daily with breakfast.     . oxyCODONE-acetaminophen (PERCOCET) 10-325 MG tablet Take 1 tablet by mouth 2 (two) times daily. 10 tablet 0  . Potassium Chloride CR (MICRO-K) 8 MEQ CPCR capsule CR Take 8 mEq by mouth daily.    . SYMBICORT 160-4.5 MCG/ACT inhaler Inhale 2 puffs into the lungs 2  (two) times daily as needed (shortness of breath).     . Vitamin D, Ergocalciferol, (DRISDOL) 1.25 MG (50000 UNIT) CAPS capsule Take 1 capsule by mouth once a week 12 capsule 0  . warfarin (COUMADIN) 5 MG tablet Take 5 mg by mouth daily.     No current facility-administered medications for this visit.   Facility-Administered Medications Ordered in Other Visits  Medication Dose Route Frequency Provider Last Rate Last Admin  . 0.9 %  sodium chloride infusion   Intravenous Continuous Brunetta Genera,  MD 10 mL/hr at 07/09/17 1423 New Bag at 07/09/17 1423  . pamidronate (AREDIA) 60 mg in sodium chloride 0.9 % 500 mL IVPB  60 mg Intravenous Once Brunetta Genera, MD 127.5 mL/hr at 05/29/20 1051 60 mg at 05/29/20 1051    REVIEW OF SYSTEMS:   A 10+ POINT REVIEW OF SYSTEMS WAS OBTAINED including neurology, dermatology, psychiatry, cardiac, respiratory, lymph, extremities, GI, GU, Musculoskeletal, constitutional, breasts, reproductive, HEENT.  All pertinent positives are noted in the HPI.  All others are negative.   PHYSICAL EXAMINATION:  ECOG FS:2 - Symptomatic, <50% confined to bed  Vitals:   05/29/20 0901  BP: (!) 124/56  Pulse: 72  Resp: 18  Temp: (!) 97.1 F (36.2 C)  SpO2: 97%   Wt Readings from Last 3 Encounters:  05/29/20 228 lb 12.8 oz (103.8 kg)  04/02/20 232 lb 11.2 oz (105.6 kg)  02/23/20 227 lb 12.8 oz (103.3 kg)   Body mass index is 32.83 kg/m.    Exam was given in a chair   GENERAL:alert, in no acute distress and comfortable SKIN: no acute rashes, no significant lesions EYES: conjunctiva are pink and non-injected, sclera anicteric OROPHARYNX: MMM, no exudates, no oropharyngeal erythema or ulceration NECK: supple, no JVD LYMPH:  no palpable lymphadenopathy in the cervical, axillary or inguinal regions LUNGS: clear to auscultation b/l with normal respiratory effort HEART: regular rate & rhythm ABDOMEN:  normoactive bowel sounds , non tender, not distended. No  palpable hepatosplenomegaly.  Extremity: no pedal edema PSYCH: alert & oriented x 3 with fluent speech NEURO: no focal motor/sensory deficits  LABORATORY DATA:  I have reviewed the data as listed . CBC Latest Ref Rng & Units 05/29/2020 04/02/2020 01/25/2020  WBC 4.0 - 10.5 K/uL 3.5(L) 3.9(L) 5.6  Hemoglobin 13.0 - 17.0 g/dL 11.4(L) 11.1(L) 11.3(L)  Hematocrit 39 - 52 % 37.6(L) 37.4(L) 38.0(L)  Platelets 150 - 400 K/uL 86(L) 73(L) 147(L)   . CBC    Component Value Date/Time   WBC 3.5 (L) 05/29/2020 0837   RBC 4.11 (L) 05/29/2020 0837   HGB 11.4 (L) 05/29/2020 0837   HGB 11.3 (L) 01/25/2020 1000   HGB 9.2 (L) 10/01/2017 1112   HCT 37.6 (L) 05/29/2020 0837   HCT 29.3 (L) 10/01/2017 1112   PLT 86 (L) 05/29/2020 0837   PLT 147 (L) 01/25/2020 1000   PLT 170 10/01/2017 1112   MCV 91.5 05/29/2020 0837   MCV 88.5 10/01/2017 1112   MCH 27.7 05/29/2020 0837   MCHC 30.3 05/29/2020 0837   RDW 17.2 (H) 05/29/2020 0837   RDW 15.3 (H) 10/01/2017 1112   LYMPHSABS 0.5 (L) 05/29/2020 0837   LYMPHSABS 0.5 (L) 10/01/2017 1112   MONOABS 0.4 05/29/2020 0837   MONOABS 0.5 10/01/2017 1112   EOSABS 0.1 05/29/2020 0837   EOSABS 0.1 10/01/2017 1112   BASOSABS 0.0 05/29/2020 0837   BASOSABS 0.0 10/01/2017 1112    . CMP Latest Ref Rng & Units 05/29/2020 04/02/2020 01/25/2020  Glucose 70 - 99 mg/dL 104(H) 128(H) 121(H)  BUN 8 - 23 mg/dL 34(H) 31(H) 32(H)  Creatinine 0.61 - 1.24 mg/dL 1.72(H) 1.56(H) 1.46(H)  Sodium 135 - 145 mmol/L 143 145 146(H)  Potassium 3.5 - 5.1 mmol/L 4.1 4.3 3.9  Chloride 98 - 111 mmol/L 110 109 110  CO2 22 - 32 mmol/L '23 24 26  ' Calcium 8.9 - 10.3 mg/dL 9.5 9.0 9.1  Total Protein 6.5 - 8.1 g/dL 7.1 7.1 7.0  Total Bilirubin 0.3 - 1.2 mg/dL 0.9  1.1 0.9  Alkaline Phos 38 - 126 U/L 81 81 83  AST 15 - 41 U/L 24 32 30  ALT 0 - 44 U/L '13 25 22   ' 03/18/2019 MMP: Component     Latest Ref Rng & Units 03/18/2019  IgG (Immunoglobin G), Serum     603 - 1,613 mg/dL 1,012  IgA      61 - 437 mg/dL 51 (L)  IgM (Immunoglobulin M), Srm     15 - 143 mg/dL 27  Total Protein ELP     6.0 - 8.5 g/dL 6.3  Albumin SerPl Elph-Mcnc     2.9 - 4.4 g/dL 3.8  Alpha 1     0.0 - 0.4 g/dL 0.2  Alpha2 Glob SerPl Elph-Mcnc     0.4 - 1.0 g/dL 0.7  B-Globulin SerPl Elph-Mcnc     0.7 - 1.3 g/dL 0.8  Gamma Glob SerPl Elph-Mcnc     0.4 - 1.8 g/dL 0.8  M Protein SerPl Elph-Mcnc     Not Observed g/dL Not Observed  Globulin, Total     2.2 - 3.9 g/dL 2.5  Albumin/Glob SerPl     0.7 - 1.7 1.6  IFE 1      Comment  Please Note (HCV):      Comment    RADIOGRAPHIC STUDIES: I have personally reviewed the radiological images as listed and agreed with the findings in the report. No results found.  ASSESSMENT & PLAN:   74 y.o. 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  K/L 95, No M spike on SPEP -- suggests light chain MM Cytogenetics - normal male chromosomes FISH- +11 and 13q-/-13  Patient is s/p 1 cycle of Vd and Serum free kappa LC has decreased from 700 to 203.6 with improvement in his K/L ratio from 94.59 to 29. Completed 2nd cycle of treatment with Vd + Cytoxan (245m/m2) Completed 3rd cycle of treatment with Vd + Cytoxan (2016mm2) - cytoxan held D15 due to thrombocytopenia Completed 4th of VCd Discontinued VCd after C5D8 due to intolerance with diarrhea and increasing neuropathy and fatigue with borderline functional status. -Patient did not tolerate  maintenance Revlimid 10 mg by mouth daily due to grade 2-3 diarrhea. This was discontinued and his diarrhea has resolved.  Currently on maintenance Ninlaro M spike absent   11/15/18 CT Pelvic revealed  3 mm stone in the right UVJ. 5 x 6 mm stone is identified in the left UVJ. No associated distal hydroureter. 2. Stable lucent lesion in the right iliac bone. 3. Small right groin hernia contains only fat. 11/15/18 CT Lumbar revealed  No acute fracture or malalignment. 2. Severe osteopenia and multiple old compression fractures, severe at T11 and T12. 3. Severe canal stenosis L3-4. Multilevel neural foraminal narrowing: Moderate to severe at T12-L1. 4. Slowly enlarging solid LEFT renal mass. Recommend non emergent MRI renal mass protocol on non emergent basis -No obvious new myeloma involvement seen in most recent CT imaging.         2) CAD s/p prox LAD DES PCI on 07/31/2016 with ischemic  cardiomyopathy ejection fraction of 25-35%. Rpt ECHO on 10/03/2016 shows improvement in EF to 55-60% with no RWMABN.  3) Macrocytic Anemia with moderate thrombocytopenia - due to MM and and Ninlaro. Stable.  4) Thrombocytopenia - due to MM and treatment --improved today 108k. --will monitor  5) B12 deficiency - Plan -cont SL B12 107mg po daily  6) h/o Afib with RVR on coumadin Some bradycardia with BB -continue mx per PCP and cardiology  7) Back pain -- multiple chronic compression fractures with some worsening -On bisphosphonates already -Ergocalciferol 50k units weekly -Advised that the pt not lift anything heavy  -Discussed the 11/15/18 CT imaging, as noted above, did refer pt to orthopedist -Pt has been evaluated by orthopedist in December 2020  PLAN:  -Discussed pt labwork today, 05/29/20; blood counts stable, mild thrombocytopenia (likely from [place]ast [place], blood chemistries are stable  -Discussed 05/29/2020 Vitamin D 25 (OH) is steady -Discussed 05/29/2020 K/L light chains ratio wnl -The pt has  no prohibitive toxicities from continuing Ninlaro. 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 -The pt shows no lab or clinical evidence of myeloma progression at this time. -Will continue to monitor PLT -Continue Aredia every 8 weeks -Continue weekly Ergocalciferol  -Recommend pt f/u with Cardiology as scheduled -Will see back in 2 months with labs     FOLLOW UP: F/u as per currently scheduled appointment on 07/31/2020   The total time spent in the appt was 30 minutes and more than 50% was on counseling and direct patient cares.  All of the patient's questions were answered with apparent satisfaction. The patient knows to call the clinic with any problems, questions or concerns.   GSullivan LoneMD MSilver LakeAAHIVMS SMercer County Surgery Center LLCCAscension Providence HospitalHematology/Oncology Physician CNorthwest [facility] (Office):       [phone](Work cell):  [phone](Fax):           [phone] I, JYevette Edwards am acting as a scribe for Dr. GSullivan Lone   .I have reviewed the above documentation for accuracy and completeness, and I agree with the above. .Brunetta GeneraMD

## 2020-05-31 LAB — KAPPA/LAMBDA LIGHT CHAINS
Kappa free light chain: 74.8 mg/L — ABNORMAL HIGH (ref 3.3–19.4)
Kappa, lambda light chain ratio: 3.2 — ABNORMAL HIGH (ref 0.26–1.65)
Lambda free light chains: 23.4 mg/L (ref 5.7–26.3)

## 2020-06-05 ENCOUNTER — Other Ambulatory Visit: Payer: Self-pay | Admitting: Hematology

## 2020-06-05 DIAGNOSIS — C9000 Multiple myeloma not having achieved remission: Secondary | ICD-10-CM

## 2020-06-05 NOTE — Telephone Encounter (Signed)
Please review for refill.  

## 2020-06-07 MED FILL — NINLARO 3 MG CAPS: 3 | 28 days supply | Qty: 3 | Fill #1

## 2020-07-02 ENCOUNTER — Other Ambulatory Visit: Payer: Self-pay | Admitting: Hematology

## 2020-07-02 DIAGNOSIS — C9000 Multiple myeloma not having achieved remission: Secondary | ICD-10-CM

## 2020-07-03 ENCOUNTER — Other Ambulatory Visit: Payer: Self-pay | Admitting: Hematology

## 2020-07-03 DIAGNOSIS — C9000 Multiple myeloma not having achieved remission: Secondary | ICD-10-CM

## 2020-07-05 ENCOUNTER — Telehealth: Payer: Self-pay

## 2020-07-05 MED FILL — NINLARO 3 MG CAPS: 3 | 28 days supply | Qty: 3 | Fill #0

## 2020-07-05 NOTE — Telephone Encounter (Signed)
Oral Oncology Patient Advocate Encounter   Was successful in securing patient a $12000 grant from [employer] [place] (PAF) to provide copayment coverage for Ninlaro.  This will keep the out of pocket expense at $0.     I have spoken with the patient.    The billing information is as follows and has been shared with [place]: [id number] PCN:  [id number] Member ID: [id number] Group ID: [id number] Dates of Eligibility: 07/05/20 through 07/05/21  [place] [facility] [place] Phone [phone] Fax [phone] 07/05/2020 8:20 AM

## 2020-07-12 ENCOUNTER — Telehealth: Payer: Self-pay | Admitting: *Deleted

## 2020-07-12 NOTE — Telephone Encounter (Signed)
A message was left, re: his follow up visit. 

## 2020-07-30 NOTE — Progress Notes (Signed)
HEMATOLOGY/ONCOLOGY CLINIC NOTE  Date of Service: 07/31/20    Patient Care Team: Sandi Mariscal, MD as PCP - General (Internal Medicine) Lorretta Harp, MD as PCP - Cardiology (Cardiology)  CHIEF COMPLAINTS/PURPOSE OF CONSULTATION:   F/u for continued management of Myeloma   HISTORY OF PRESENTING ILLNESS:  plz see previous note for details on HPI  INTERVAL HISTORY: [name] is here for his scheduled followup for multiple myeloma. The patient's last visit with [name] was on 05/29/2020. The pt reports that he is doing well overall.  The pt reports that he continues to experience back pain that radiates around his side. He notes an increased shortness of breath with movement. His shortness of breath has been worsening over the last 6-12 months and has had difficulty getting around his house for the last 6 months. Pt has an appointment with his Cardiologist, Dr. Gwenlyn Found, at the end of December and an appointment with his PCP on Thursday. Pt is using Symbicort irregularly and does not have a short-term inhaler. Pt denies any abnormal or excessive blood loss. He also denies any recent falls.  Lab results today (07/31/20) of CBC w/diff and CMP is as follows: all values are WNL except for WBC at 3.5K, RBC at 3.68, Hgb at 10.3, HCT at 34.0, RDW at 17.3, PLT at 72K, Lymphs Abs at 0.4K, Glucose at 103, BUN at 33, Creatinine at 1.57, GFR Est at 43. 07/31/2020 K/L light chains is in progress  On review of systems, pt reports SOB, chronic back pain and denies leg swelling, abdominal pain, fevers, chills, night sweats, nose bleeds, gum bleeds, bloody/black stools, hematuria, chest pain and any other symptoms.   MEDICAL HISTORY:  Past Medical History:  Diagnosis Date  . Arthritis    "left shoulder" (07/31/2016)  . Coronary artery disease   . GERD (gastroesophageal reflux disease)   . Heart murmur   . High cholesterol   . Hypertension   . Multiple myeloma ([employer])   . Sleep apnea    "probably; having  test in November" (07/31/2016)  . Type II diabetes mellitus ([place])     SURGICAL HISTORY: Past Surgical History:  Procedure Laterality Date  . CARDIAC CATHETERIZATION N/A 07/31/2016   Procedure: Left Heart Cath and Coronary Angiography;  Surgeon: Lorretta Harp, MD;  Location: [facility];  Service: Cardiovascular;  Laterality: N/A;  . CARDIAC CATHETERIZATION N/A 07/31/2016   Procedure: Coronary Stent Intervention;  Surgeon: Lorretta Harp, MD;  Location: [facility];  Service: Cardiovascular;  Laterality: N/A;  . CORONARY ANGIOPLASTY    . OPEN REDUCTION INTERNAL FIXATION (ORIF) METACARPAL Left 05/21/2018   Procedure: OPEN REDUCTION INTERNAL FIXATION (ORIF) 2ND METACARPAL;  Surgeon: Shona Needles, MD;  Location: [facility];  Service: Orthopedics;  Laterality: Left;  . ORIF CLAVICULAR FRACTURE Right 05/21/2018   Procedure: OPEN REDUCTION INTERNAL FIXATION (ORIF) CLAVICULAR FRACTURE;  Surgeon: Shona Needles, MD;  Location: [facility];  Service: Orthopedics;  Laterality: Right;  . SHOULDER SURGERY Left 1973   "put pin in it where it had separated"   . TONSILLECTOMY  ~ 1956    SOCIAL HISTORY: Social History   Socioeconomic History  . Marital status: Divorced    Spouse name: Not on file  . Number of children: Not on file  . Years of education: Not on file  . Highest education level: Not on file  Occupational History  . Not on file  Tobacco Use  . Smoking status: Never  Smoker  . Smokeless tobacco: Never Used  Vaping Use  . Vaping Use: Never used  Substance and Sexual Activity  . Alcohol use: Yes    Comment: 07/31/2016 "nothing since 2002"  . Drug use: No  . Sexual activity: Not Currently  Other Topics Concern  . Not on file  Social History Narrative  . Not on file   Social Determinants of Health   Financial Resource Strain:   . Difficulty of Paying Living Expenses: Not on file  Food Insecurity:   . Worried About Charity fundraiser in the Last Year: Not on file  .  Ran Out of Food in the Last Year: Not on file  Transportation Needs:   . Lack of Transportation (Medical): Not on file  . Lack of Transportation (Non-Medical): Not on file  Physical Activity:   . Days of Exercise per Week: Not on file  . Minutes of Exercise per Session: Not on file  Stress:   . Feeling of Stress : Not on file  Social Connections:   . Frequency of Communication with Friends and Family: Not on file  . Frequency of Social Gatherings with Friends and Family: Not on file  . Attends Religious Services: Not on file  . Active Member of Clubs or Organizations: Not on file  . Attends Archivist Meetings: Not on file  . Marital Status: Not on file  Intimate Partner Violence:   . Fear of Current or Ex-Partner: Not on file  . Emotionally Abused: Not on file  . Physically Abused: Not on file  . Sexually Abused: Not on file    FAMILY HISTORY: Family History  Problem Relation Age of Onset  . Hypertension Other     ALLERGIES:  has No Known Allergies.  MEDICATIONS:  Current Outpatient Medications  Medication Sig Dispense Refill  . acyclovir (ZOVIRAX) 400 MG tablet Take 1 tablet by mouth twice daily 60 tablet 0  . amLODipine (NORVASC) 5 MG tablet Take 5 mg by mouth daily.    [name] atorvastatin (LIPITOR) 40 MG tablet Take 1 tablet (40 mg total) by mouth at bedtime. 30 tablet 12  . carvedilol (COREG) 12.5 MG tablet Take 12.5 mg by mouth 2 (two) times daily with a meal.    . clopidogrel (PLAVIX) 75 MG tablet Take 1 tablet (75 mg total) by mouth daily with breakfast. 30 tablet 12  . cyclobenzaprine (FLEXERIL) 10 MG tablet Take 10 mg by mouth daily as needed for muscle spasms.   2  . escitalopram (LEXAPRO) 10 MG tablet Take 10 mg by mouth daily.    . furosemide (LASIX) 40 MG tablet Take 40 mg by mouth.    . montelukast (SINGULAIR) 10 MG tablet Take 10 mg by mouth daily.    [name] NINLARO 3 MG capsule TAKE 1 CAPSULE (3 MG TOTAL) BY MOUTH ONCE A WEEK. ON DAY 1,8,15 EVERY 28 DAYS.  TAKE ON AN EMPTY STOMACH 1HR BEFORE OR 2HRS AFTER FOOD. 3 capsule 1  . omeprazole (PRILOSEC) 40 MG capsule Take 40 mg by mouth daily with breakfast.     . oxyCODONE-acetaminophen (PERCOCET) 10-325 MG tablet Take 1 tablet by mouth 2 (two) times daily. 10 tablet 0  . Potassium Chloride CR (MICRO-K) 8 MEQ CPCR capsule CR Take 8 mEq by mouth daily.    . SYMBICORT 160-4.5 MCG/ACT inhaler Inhale 2 puffs into the lungs 2 (two) times daily as needed (shortness of breath).     . Vitamin D, Ergocalciferol, (DRISDOL) 1.25 MG (  50000 UNIT) CAPS capsule Take 1 capsule by mouth once a week 12 capsule 0  . warfarin (COUMADIN) 5 MG tablet Take 5 mg by mouth daily.     No current facility-administered medications for this visit.   Facility-Administered Medications Ordered in Other Visits  Medication Dose Route Frequency Provider Last Rate Last Admin  . 0.9 %  sodium chloride infusion   Intravenous Continuous Brunetta Genera, MD 10 mL/hr at 07/09/17 1423 New Bag at 07/09/17 1423    REVIEW OF SYSTEMS:   A 10+ POINT REVIEW OF SYSTEMS WAS OBTAINED including neurology, dermatology, psychiatry, cardiac, respiratory, lymph, extremities, GI, GU, Musculoskeletal, constitutional, breasts, reproductive, HEENT.  All pertinent positives are noted in the HPI.  All others are negative.   PHYSICAL EXAMINATION:  ECOG FS:2 - Symptomatic, <50% confined to bed  Vitals:   07/31/20 1115  BP: 117/75  Pulse: 65  Resp: 18  Temp: (!) 97.5 F (36.4 C)  SpO2: 100%   Wt Readings from Last 3 Encounters:  07/31/20 228 lb 8 oz (103.6 kg)  05/29/20 228 lb 12.8 oz (103.8 kg)  04/02/20 232 lb 11.2 oz (105.6 kg)   Body mass index is 32.79 kg/m.    GENERAL:alert, in no acute distress and comfortable SKIN: no acute rashes, no significant lesions EYES: conjunctiva are pink and non-injected, sclera anicteric OROPHARYNX: MMM, no exudates, no oropharyngeal erythema or ulceration NECK: supple, no JVD LYMPH:  no palpable  lymphadenopathy in the cervical, axillary or inguinal regions LUNGS: clear to auscultation b/l with normal respiratory effort HEART: regular rate & rhythm ABDOMEN:  normoactive bowel sounds , non tender, not distended. No palpable hepatosplenomegaly.  Extremity: no pedal edema PSYCH: alert & oriented x 3 with fluent speech NEURO: no focal motor/sensory deficits  LABORATORY DATA:  I have reviewed the data as listed . CBC Latest Ref Rng & Units 07/31/2020 05/29/2020 04/02/2020  WBC 4.0 - 10.5 K/uL 3.5(L) 3.5(L) 3.9(L)  Hemoglobin 13.0 - 17.0 g/dL 10.3(L) 11.4(L) 11.1(L)  Hematocrit 39 - 52 % 34.0(L) 37.6(L) 37.4(L)  Platelets 150 - 400 K/uL 72(L) 86(L) 73(L)   . CBC    Component Value Date/Time   WBC 3.5 (L) 07/31/2020 0957   RBC 3.68 (L) 07/31/2020 0957   HGB 10.3 (L) 07/31/2020 0957   HGB 11.3 (L) 01/25/2020 1000   HGB 9.2 (L) 10/01/2017 1112   HCT 34.0 (L) 07/31/2020 0957   HCT 29.3 (L) 10/01/2017 1112   PLT 72 (L) 07/31/2020 0957   PLT 147 (L) 01/25/2020 1000   PLT 170 10/01/2017 1112   MCV 92.4 07/31/2020 0957   MCV 88.5 10/01/2017 1112   MCH 28.0 07/31/2020 0957   MCHC 30.3 07/31/2020 0957   RDW 17.3 (H) 07/31/2020 0957   RDW 15.3 (H) 10/01/2017 1112   LYMPHSABS 0.4 (L) 07/31/2020 0957   LYMPHSABS 0.5 (L) 10/01/2017 1112   MONOABS 0.4 07/31/2020 0957   MONOABS 0.5 10/01/2017 1112   EOSABS 0.1 07/31/2020 0957   EOSABS 0.1 10/01/2017 1112   BASOSABS 0.0 07/31/2020 0957   BASOSABS 0.0 10/01/2017 1112    . CMP Latest Ref Rng & Units 07/31/2020 05/29/2020 04/02/2020  Glucose 70 - 99 mg/dL 103(H) 104(H) 128(H)  BUN 8 - 23 mg/dL 33(H) 34(H) 31(H)  Creatinine 0.61 - 1.24 mg/dL 1.57(H) 1.72(H) 1.56(H)  Sodium 135 - 145 mmol/L 144 143 145  Potassium 3.5 - 5.1 mmol/L 4.1 4.1 4.3  Chloride 98 - 111 mmol/L 110 110 109  CO2 22 - 32 mmol/L  '27 23 24  ' Calcium 8.9 - 10.3 mg/dL 9.2 9.5 9.0  Total Protein 6.5 - 8.1 g/dL 6.9 7.1 7.1  Total Bilirubin 0.3 - 1.2 mg/dL 0.9 0.9 1.1   Alkaline Phos 38 - 126 U/L 84 81 81  AST 15 - 41 U/L 21 24 32  ALT 0 - 44 U/L '13 13 25   ' 03/18/2019 MMP: Component     Latest Ref Rng & Units 03/18/2019  IgG (Immunoglobin G), Serum     603 - 1,613 mg/dL 1,012  IgA     61 - 437 mg/dL 51 (L)  IgM (Immunoglobulin M), Srm     15 - 143 mg/dL 27  Total Protein ELP     6.0 - 8.5 g/dL 6.3  Albumin SerPl Elph-Mcnc     2.9 - 4.4 g/dL 3.8  Alpha 1     0.0 - 0.4 g/dL 0.2  Alpha2 Glob SerPl Elph-Mcnc     0.4 - 1.0 g/dL 0.7  B-Globulin SerPl Elph-Mcnc     0.7 - 1.3 g/dL 0.8  Gamma Glob SerPl Elph-Mcnc     0.4 - 1.8 g/dL 0.8  M Protein SerPl Elph-Mcnc     Not Observed g/dL Not Observed  Globulin, Total     2.2 - 3.9 g/dL 2.5  Albumin/Glob SerPl     0.7 - 1.7 1.6  IFE 1      Comment  Please Note (HCV):      Comment    RADIOGRAPHIC STUDIES: I have personally reviewed the radiological images as listed and agreed with the findings in the report. No results found.  ASSESSMENT & PLAN:   74 y.o. 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  K/L 95, No M spike on SPEP -- suggests light chain MM Cytogenetics - normal male chromosomes FISH- +11 and 13q-/-13  Patient is s/p 1 cycle of Vd and Serum free kappa LC has decreased from 700 to 203.6 with improvement in his K/L ratio from 94.59 to 29. Completed 2nd cycle of treatment with Vd + Cytoxan (281m/m2) Completed 3rd cycle of treatment with Vd + Cytoxan (2024mm2) -  cytoxan held D15 due to thrombocytopenia Completed 4th of VCd Discontinued VCd after C5D8 due to intolerance with diarrhea and increasing neuropathy and fatigue with borderline functional status. -Patient did not tolerate maintenance Revlimid 10 mg by mouth daily due to grade 2-3 diarrhea. This was discontinued and his diarrhea has resolved.  Currently on maintenance Ninlaro M spike absent   11/15/18 CT Pelvic revealed  3 mm stone in the right UVJ. 5 x 6 mm stone is identified in the left UVJ. No associated distal hydroureter. 2. Stable lucent lesion in the right iliac bone. 3. Small right groin hernia contains only fat. 11/15/18 CT Lumbar revealed  No acute fracture or malalignment. 2. Severe osteopenia and multiple old compression fractures, severe at T11 and T12. 3. Severe canal stenosis L3-4. Multilevel neural foraminal narrowing: Moderate to severe at T12-L1. 4. Slowly enlarging solid LEFT renal mass. Recommend non emergent MRI renal mass protocol on non emergent basis -  No obvious new myeloma involvement seen in most recent CT imaging.         2) CAD s/p prox LAD DES PCI on 07/31/2016 with ischemic cardiomyopathy ejection fraction of 25-35%. Rpt ECHO on 10/03/2016 shows improvement in EF to 55-60% with no RWMABN.  3) Macrocytic Anemia with moderate thrombocytopenia - due to MM and and Ninlaro. Stable.  4) Thrombocytopenia - due to MM and treatment --improved today 108k. --will monitor  5) B12 deficiency - Plan -cont SL B12 104mg po daily  6) h/o Afib with RVR on coumadin Some bradycardia with BB -continue mx per PCP and cardiology  7) Back pain -- multiple chronic compression fractures with some worsening -On bisphosphonates already -Ergocalciferol 50k units weekly -Advised that the pt not lift anything heavy  -Discussed the 11/15/18 CT imaging, as noted above, did refer pt to orthopedist -Pt has been evaluated by orthopedist in December 2020  PLAN:  -Discussed pt labwork  today, 07/31/20; all values are WNL except for WBC at 3.5K, RBC at 3.68, Hgb at 10.3, HCT at 34.0, RDW at 17.3, PLT at 72K, Lymphs Abs at 0.4K, Glucose at 103, BUN at 33, Creatinine at 1.57, GFR Est at 43. -Discussed 07/31/2020 K/L light chains is in progress, 05/29/2020 K/L light chains are stable. -The pt has no prohibitive toxicities from continuing Ninlaro at this time. -No lab or clinical evidence of myeloma progression at this time. -Recommend pt weigh himself at home regularly to keep track of fluid retention.  -Advised pt that his mild anemia may be contributing to his SOB, but wouldn't be the primary cause. -Recommend pt f/u with PCP as scheduled for SOB evaluation. Advised pt that if it worsens acutely he should go to the ED.  -Recommend pt f/u with Cardiologist as per PCP recommendation. -Will hold Aredia today. Plan to restart at next visit. -Continue weekly Ergocalciferol  -Will see back in 2 months with labs   FOLLOW UP: Hold Aredia today RTC with Dr KIrene Limbowith labs and for next dose of Aredia in 2 months   The total time spent in the appt was 20 minutes and more than 50% was on counseling and direct patient cares.  All of the patient's questions were answered with apparent satisfaction. The patient knows to call the clinic with any problems, questions or concerns.   GSullivan LoneMD MCloudAAHIVMS SSouthwest Colorado Surgical Center LLCCPacific Grove HospitalHematology/Oncology Physician CAtlantic Coastal Surgery [facility] (Office):       [phone](Work cell):  [phone](Fax):           [phone] I, JYevette Edwards am acting as a scribe for Dr. GSullivan Lone   .I have reviewed the above documentation for accuracy and completeness, and I agree with the above. .Brunetta GeneraMD

## 2020-07-31 ENCOUNTER — Other Ambulatory Visit: Payer: Medicare HMO

## 2020-07-31 ENCOUNTER — Other Ambulatory Visit: Payer: Self-pay

## 2020-07-31 ENCOUNTER — Inpatient Hospital Stay (HOSPITAL_BASED_OUTPATIENT_CLINIC_OR_DEPARTMENT_OTHER): Payer: Medicare HMO | Admitting: Hematology

## 2020-07-31 ENCOUNTER — Ambulatory Visit: Payer: Medicare HMO

## 2020-07-31 ENCOUNTER — Ambulatory Visit: Payer: Medicare HMO | Admitting: Hematology and Oncology

## 2020-07-31 ENCOUNTER — Inpatient Hospital Stay: Payer: Medicare HMO

## 2020-07-31 ENCOUNTER — Inpatient Hospital Stay: Payer: Medicare HMO | Attending: Hematology

## 2020-07-31 VITALS — BP 117/75 | HR 65 | Temp 97.5°F | Resp 18 | Ht 70.0 in | Wt 228.5 lb

## 2020-07-31 DIAGNOSIS — E1140 Type 2 diabetes mellitus with diabetic neuropathy, unspecified: Secondary | ICD-10-CM | POA: Diagnosis not present

## 2020-07-31 DIAGNOSIS — M549 Dorsalgia, unspecified: Secondary | ICD-10-CM | POA: Diagnosis not present

## 2020-07-31 DIAGNOSIS — C9000 Multiple myeloma not having achieved remission: Secondary | ICD-10-CM | POA: Diagnosis present

## 2020-07-31 DIAGNOSIS — K219 Gastro-esophageal reflux disease without esophagitis: Secondary | ICD-10-CM | POA: Insufficient documentation

## 2020-07-31 DIAGNOSIS — E7800 Pure hypercholesterolemia, unspecified: Secondary | ICD-10-CM | POA: Insufficient documentation

## 2020-07-31 DIAGNOSIS — I10 Essential (primary) hypertension: Secondary | ICD-10-CM | POA: Diagnosis not present

## 2020-07-31 DIAGNOSIS — I4891 Unspecified atrial fibrillation: Secondary | ICD-10-CM | POA: Diagnosis not present

## 2020-07-31 DIAGNOSIS — R001 Bradycardia, unspecified: Secondary | ICD-10-CM | POA: Diagnosis not present

## 2020-07-31 DIAGNOSIS — Z79899 Other long term (current) drug therapy: Secondary | ICD-10-CM | POA: Diagnosis not present

## 2020-07-31 DIAGNOSIS — I2510 Atherosclerotic heart disease of native coronary artery without angina pectoris: Secondary | ICD-10-CM | POA: Diagnosis not present

## 2020-07-31 DIAGNOSIS — Z7901 Long term (current) use of anticoagulants: Secondary | ICD-10-CM | POA: Diagnosis not present

## 2020-07-31 DIAGNOSIS — D696 Thrombocytopenia, unspecified: Secondary | ICD-10-CM

## 2020-07-31 DIAGNOSIS — G8929 Other chronic pain: Secondary | ICD-10-CM | POA: Insufficient documentation

## 2020-07-31 DIAGNOSIS — E538 Deficiency of other specified B group vitamins: Secondary | ICD-10-CM | POA: Diagnosis not present

## 2020-07-31 DIAGNOSIS — G4730 Sleep apnea, unspecified: Secondary | ICD-10-CM | POA: Insufficient documentation

## 2020-07-31 DIAGNOSIS — D6959 Other secondary thrombocytopenia: Secondary | ICD-10-CM | POA: Insufficient documentation

## 2020-07-31 DIAGNOSIS — D649 Anemia, unspecified: Secondary | ICD-10-CM | POA: Diagnosis not present

## 2020-07-31 DIAGNOSIS — Z7951 Long term (current) use of inhaled steroids: Secondary | ICD-10-CM | POA: Insufficient documentation

## 2020-07-31 DIAGNOSIS — M1990 Unspecified osteoarthritis, unspecified site: Secondary | ICD-10-CM | POA: Insufficient documentation

## 2020-07-31 LAB — CMP (CANCER CENTER ONLY)
ALT: 13 U/L (ref 0–44)
AST: 21 U/L (ref 15–41)
Albumin: 3.6 g/dL (ref 3.5–5.0)
Alkaline Phosphatase: 84 U/L (ref 38–126)
Anion gap: 7 (ref 5–15)
BUN: 33 mg/dL — ABNORMAL HIGH (ref 8–23)
CO2: 27 mmol/L (ref 22–32)
Calcium: 9.2 mg/dL (ref 8.9–10.3)
Chloride: 110 mmol/L (ref 98–111)
Creatinine: 1.57 mg/dL — ABNORMAL HIGH (ref 0.61–1.24)
GFR, Estimated: 43 mL/min — ABNORMAL LOW (ref >60)
Glucose, Bld: 103 mg/dL — ABNORMAL HIGH (ref 70–99)
Potassium: 4.1 mmol/L (ref 3.5–5.1)
Sodium: 144 mmol/L (ref 135–145)
Total Bilirubin: 0.9 mg/dL (ref 0.3–1.2)
Total Protein: 6.9 g/dL (ref 6.5–8.1)

## 2020-07-31 LAB — CBC WITH DIFFERENTIAL/PLATELET
Abs Immature Granulocytes: 0.02 10*3/uL (ref 0.00–0.07)
Basophils Absolute: 0 10*3/uL (ref 0.0–0.1)
Basophils Relative: 0 %
Eosinophils Absolute: 0.1 10*3/uL (ref 0.0–0.5)
Eosinophils Relative: 1 %
HCT: 34 % — ABNORMAL LOW (ref 39.0–52.0)
Hemoglobin: 10.3 g/dL — ABNORMAL LOW (ref 13.0–17.0)
Immature Granulocytes: 1 %
Lymphocytes Relative: 12 %
Lymphs Abs: 0.4 10*3/uL — ABNORMAL LOW (ref 0.7–4.0)
MCH: 28 pg (ref 26.0–34.0)
MCHC: 30.3 g/dL (ref 30.0–36.0)
MCV: 92.4 fL (ref 80.0–100.0)
Monocytes Absolute: 0.4 10*3/uL (ref 0.1–1.0)
Monocytes Relative: 10 %
Neutro Abs: 2.7 10*3/uL (ref 1.7–7.7)
Neutrophils Relative %: 76 %
Platelets: 72 10*3/uL — ABNORMAL LOW (ref 150–400)
RBC: 3.68 MIL/uL — ABNORMAL LOW (ref 4.22–5.81)
RDW: 17.3 % — ABNORMAL HIGH (ref 11.5–15.5)
WBC: 3.5 10*3/uL — ABNORMAL LOW (ref 4.0–10.5)
nRBC: 0 % (ref 0.0–0.2)

## 2020-08-01 ENCOUNTER — Other Ambulatory Visit: Payer: Self-pay | Admitting: Hematology

## 2020-08-01 DIAGNOSIS — C9000 Multiple myeloma not having achieved remission: Secondary | ICD-10-CM

## 2020-08-01 LAB — KAPPA/LAMBDA LIGHT CHAINS
Kappa free light chain: 95.9 mg/L — ABNORMAL HIGH (ref 3.3–19.4)
Kappa, lambda light chain ratio: 4.77 — ABNORMAL HIGH (ref 0.26–1.65)
Lambda free light chains: 20.1 mg/L (ref 5.7–26.3)

## 2020-08-02 MED FILL — NINLARO 3 MG CAPS: 3 | 28 days supply | Qty: 3 | Fill #1

## 2020-08-02 NOTE — Telephone Encounter (Signed)
Please review for refill.  

## 2020-08-15 ENCOUNTER — Telehealth: Payer: Self-pay | Admitting: Medical Oncology

## 2020-08-15 NOTE — Telephone Encounter (Signed)
err

## 2020-08-27 ENCOUNTER — Other Ambulatory Visit: Payer: Self-pay | Admitting: Hematology

## 2020-08-27 DIAGNOSIS — C9000 Multiple myeloma not having achieved remission: Secondary | ICD-10-CM

## 2020-08-30 MED FILL — NINLARO 3 MG CAPS: 3 | 28 days supply | Qty: 3 | Fill #0

## 2020-09-06 ENCOUNTER — Other Ambulatory Visit: Payer: Self-pay | Admitting: Hematology

## 2020-09-06 DIAGNOSIS — C9000 Multiple myeloma not having achieved remission: Secondary | ICD-10-CM

## 2020-09-28 ENCOUNTER — Other Ambulatory Visit: Payer: Self-pay

## 2020-09-28 ENCOUNTER — Ambulatory Visit: Payer: Medicare HMO | Admitting: Cardiology

## 2020-09-28 ENCOUNTER — Encounter: Payer: Self-pay | Admitting: Cardiology

## 2020-09-28 VITALS — BP 128/80 | HR 71 | Ht 70.0 in | Wt 223.4 lb

## 2020-09-28 DIAGNOSIS — I2510 Atherosclerotic heart disease of native coronary artery without angina pectoris: Secondary | ICD-10-CM | POA: Diagnosis not present

## 2020-09-28 DIAGNOSIS — I482 Chronic atrial fibrillation, unspecified: Secondary | ICD-10-CM | POA: Diagnosis not present

## 2020-09-28 DIAGNOSIS — R0600 Dyspnea, unspecified: Secondary | ICD-10-CM

## 2020-09-28 DIAGNOSIS — Z9861 Coronary angioplasty status: Secondary | ICD-10-CM

## 2020-09-28 DIAGNOSIS — N1832 Chronic kidney disease, stage 3b: Secondary | ICD-10-CM

## 2020-09-28 DIAGNOSIS — I10 Essential (primary) hypertension: Secondary | ICD-10-CM

## 2020-09-28 DIAGNOSIS — R0609 Other forms of dyspnea: Secondary | ICD-10-CM

## 2020-09-28 DIAGNOSIS — C9000 Multiple myeloma not having achieved remission: Secondary | ICD-10-CM

## 2020-09-28 DIAGNOSIS — I5032 Chronic diastolic (congestive) heart failure: Secondary | ICD-10-CM

## 2020-09-28 DIAGNOSIS — E785 Hyperlipidemia, unspecified: Secondary | ICD-10-CM

## 2020-09-28 DIAGNOSIS — Z7901 Long term (current) use of anticoagulants: Secondary | ICD-10-CM

## 2020-09-28 MED ORDER — ISOSORBIDE MONONITRATE ER 30 MG PO TB24: 30.0000 mg | ORAL_TABLET | Freq: Every day | ORAL | 3 refills | Status: DC

## 2020-09-28 MED FILL — NINLARO 3 MG CAPS: 3 | 28 days supply | Qty: 3 | Fill #1
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
## 2020-09-28 NOTE — Assessment & Plan Note (Signed)
Admitted to [facility] 8/10-8/08/2019- 7 lb diuresis. DC weight 225 lbs. EF 25-30% in 2017 pre PCI- EF 50-55% 05/31/2019 by echo-diuretic added

## 2020-09-28 NOTE — Assessment & Plan Note (Signed)
On statin Rx 

## 2020-09-28 NOTE — Assessment & Plan Note (Signed)
GFR 35- SCr 1.5-1.7

## 2020-09-28 NOTE — Assessment & Plan Note (Signed)
This is his primary complaint- similar to pre PCI symptoms which improved after PCI (no history of chest pain)

## 2020-09-28 NOTE — Assessment & Plan Note (Signed)
Followed by Dr Kale-multiple compression fractures

## 2020-09-28 NOTE — Assessment & Plan Note (Signed)
LAD PCI with DES after abnormal Myoview Oct 2017 (Dr Shanda Howells pt)

## 2020-09-28 NOTE — Assessment & Plan Note (Signed)
Coumadin Rx- CHADS VASC= 4

## 2020-09-28 NOTE — Patient Instructions (Signed)
Medication Instructions:  START- Isosorbide 30 mg by mouth daily  *If you need a refill on your cardiac medications before your next appointment, please call your pharmacy*   Lab Work: None Ordered   Testing/Procedures: Your physician has requested that you have a lexiscan myoview. For further information please visit [email or web address]. Please follow instruction sheet, as given.  Your physician has requested that you have an echocardiogram. Echocardiography is a painless test that uses sound waves to create images of your heart. It provides your doctor with information about the size and shape of your heart and how well your heart's chambers and valves are working. This procedure takes approximately one hour. There are no restrictions for this procedure.   Follow-Up: At [facility], you and your health needs are our priority.  As part of our continuing mission to provide you with exceptional heart care, we have created designated Provider Care Teams.  These Care Teams include your primary Cardiologist (physician) and Advanced Practice Providers (APPs -  Physician Assistants and Nurse Practitioners) who all work together to provide you with the care you need, when you need it.  We recommend signing up for the patient portal called "MyChart".  Sign up information is provided on this After Visit Summary.  MyChart is used to connect with patients for Virtual Visits (Telemedicine).  Patients are able to view lab/test results, encounter notes, upcoming appointments, etc.  Non-urgent messages can be sent to your provider as well.   To learn more about what you can do with MyChart, go to [email or web address].    Your next appointment:   4 week(s)  The format for your next appointment:   In Person  Provider:   You may see Quay Burow, MD or one of the following Advanced Practice Providers on your designated Care Team:    Kerin Ransom, PA-C  [place], [place]  Coletta Memos,  [place]

## 2020-09-28 NOTE — Assessment & Plan Note (Signed)
Controlled.  

## 2020-09-28 NOTE — Assessment & Plan Note (Signed)
CAF- rate controlled-Coumadin per PCP

## 2020-10-01 ENCOUNTER — Inpatient Hospital Stay: Payer: Medicare HMO

## 2020-10-01 ENCOUNTER — Other Ambulatory Visit: Payer: Self-pay

## 2020-10-01 ENCOUNTER — Inpatient Hospital Stay: Payer: Medicare HMO | Attending: Hematology

## 2020-10-01 ENCOUNTER — Inpatient Hospital Stay (HOSPITAL_BASED_OUTPATIENT_CLINIC_OR_DEPARTMENT_OTHER): Payer: Medicare HMO | Admitting: Hematology

## 2020-10-01 VITALS — BP 115/67 | HR 61 | Temp 97.3°F | Resp 18 | Ht 70.0 in | Wt 226.7 lb

## 2020-10-01 DIAGNOSIS — E1140 Type 2 diabetes mellitus with diabetic neuropathy, unspecified: Secondary | ICD-10-CM | POA: Diagnosis not present

## 2020-10-01 DIAGNOSIS — E7800 Pure hypercholesterolemia, unspecified: Secondary | ICD-10-CM | POA: Diagnosis not present

## 2020-10-01 DIAGNOSIS — I2510 Atherosclerotic heart disease of native coronary artery without angina pectoris: Secondary | ICD-10-CM | POA: Diagnosis not present

## 2020-10-01 DIAGNOSIS — C9000 Multiple myeloma not having achieved remission: Secondary | ICD-10-CM

## 2020-10-01 DIAGNOSIS — Z79899 Other long term (current) drug therapy: Secondary | ICD-10-CM | POA: Diagnosis not present

## 2020-10-01 DIAGNOSIS — Z7951 Long term (current) use of inhaled steroids: Secondary | ICD-10-CM | POA: Insufficient documentation

## 2020-10-01 DIAGNOSIS — E538 Deficiency of other specified B group vitamins: Secondary | ICD-10-CM | POA: Diagnosis not present

## 2020-10-01 DIAGNOSIS — D649 Anemia, unspecified: Secondary | ICD-10-CM | POA: Diagnosis not present

## 2020-10-01 DIAGNOSIS — Z7901 Long term (current) use of anticoagulants: Secondary | ICD-10-CM | POA: Insufficient documentation

## 2020-10-01 DIAGNOSIS — R0602 Shortness of breath: Secondary | ICD-10-CM | POA: Diagnosis not present

## 2020-10-01 DIAGNOSIS — M549 Dorsalgia, unspecified: Secondary | ICD-10-CM | POA: Diagnosis not present

## 2020-10-01 DIAGNOSIS — I10 Essential (primary) hypertension: Secondary | ICD-10-CM | POA: Insufficient documentation

## 2020-10-01 DIAGNOSIS — I4891 Unspecified atrial fibrillation: Secondary | ICD-10-CM | POA: Insufficient documentation

## 2020-10-01 DIAGNOSIS — D6959 Other secondary thrombocytopenia: Secondary | ICD-10-CM | POA: Diagnosis not present

## 2020-10-01 DIAGNOSIS — Z87311 Personal history of (healed) other pathological fracture: Secondary | ICD-10-CM | POA: Insufficient documentation

## 2020-10-01 DIAGNOSIS — D696 Thrombocytopenia, unspecified: Secondary | ICD-10-CM

## 2020-10-01 LAB — CMP (CANCER CENTER ONLY)
ALT: 16 U/L (ref 0–44)
AST: 26 U/L (ref 15–41)
Albumin: 3.6 g/dL (ref 3.5–5.0)
Alkaline Phosphatase: 76 U/L (ref 38–126)
Anion gap: 8 (ref 5–15)
BUN: 32 mg/dL — ABNORMAL HIGH (ref 8–23)
CO2: 22 mmol/L (ref 22–32)
Calcium: 9.2 mg/dL (ref 8.9–10.3)
Chloride: 113 mmol/L — ABNORMAL HIGH (ref 98–111)
Creatinine: 1.76 mg/dL — ABNORMAL HIGH (ref 0.61–1.24)
GFR, Estimated: 40 mL/min — ABNORMAL LOW (ref >60)
Glucose, Bld: 115 mg/dL — ABNORMAL HIGH (ref 70–99)
Potassium: 4.1 mmol/L (ref 3.5–5.1)
Sodium: 143 mmol/L (ref 135–145)
Total Bilirubin: 0.9 mg/dL (ref 0.3–1.2)
Total Protein: 6.9 g/dL (ref 6.5–8.1)

## 2020-10-01 LAB — CBC WITH DIFFERENTIAL/PLATELET
Abs Immature Granulocytes: 0.02 10*3/uL (ref 0.00–0.07)
Basophils Absolute: 0 10*3/uL (ref 0.0–0.1)
Basophils Relative: 0 %
Eosinophils Absolute: 0.2 10*3/uL (ref 0.0–0.5)
Eosinophils Relative: 4 %
HCT: 33.8 % — ABNORMAL LOW (ref 39.0–52.0)
Hemoglobin: 10.4 g/dL — ABNORMAL LOW (ref 13.0–17.0)
Immature Granulocytes: 0 %
Lymphocytes Relative: 9 %
Lymphs Abs: 0.5 10*3/uL — ABNORMAL LOW (ref 0.7–4.0)
MCH: 27.9 pg (ref 26.0–34.0)
MCHC: 30.8 g/dL (ref 30.0–36.0)
MCV: 90.6 fL (ref 80.0–100.0)
Monocytes Absolute: 0.4 10*3/uL (ref 0.1–1.0)
Monocytes Relative: 8 %
Neutro Abs: 3.8 10*3/uL (ref 1.7–7.7)
Neutrophils Relative %: 79 %
Platelets: 136 10*3/uL — ABNORMAL LOW (ref 150–400)
RBC: 3.73 MIL/uL — ABNORMAL LOW (ref 4.22–5.81)
RDW: 16.9 % — ABNORMAL HIGH (ref 11.5–15.5)
WBC: 4.9 10*3/uL (ref 4.0–10.5)
nRBC: 0 % (ref 0.0–0.2)

## 2020-10-01 NOTE — Progress Notes (Signed)
HEMATOLOGY/ONCOLOGY CLINIC NOTE  Date of Service: 10/01/20    Patient Care Team: Sandi Mariscal, MD as PCP - General (Internal Medicine) Lorretta Harp, MD as PCP - Cardiology (Cardiology)  CHIEF COMPLAINTS/PURPOSE OF CONSULTATION:   F/u for continued management of Myeloma   HISTORY OF PRESENTING ILLNESS:  plz see previous note for details on HPI  INTERVAL HISTORY: [name] is here for his scheduled followup for multiple myeloma. The patient's last visit with [name] was on 07/31/2020. The pt reports that he is doing well overall.  The pt reports that he has been more short of breath lately. These symptoms came on slowly. Pt had a follow up with his Cardiologist who found that the pt's oxygen saturation dropped into the 80's while walking around the office. His Cardiologist started him on 30 mg Imdur and will complete a Myoview and ECHO. They will also consider a Pulmonology referral based on the results of the tests.  Lab results today (10/01/20) of CBC w/diff and CMP is as follows: all values are WNL except for RBC at 3.73, Hgb at 10.4, HCT at 33.8, RDW at 16.9, PLT at 136K, Lymphs Abs at 0.5K, Chloride a 113, Glucose at 115, BUN at 32, Creatinine at 1.76, GFR Est at 40. 10/01/2020 MMP is in progress 10/01/2020 K/L light chains is in progress  On review of systems, pt reports SOB and denies low appetite, new bone pain and any other symptoms.    MEDICAL HISTORY:  Past Medical History:  Diagnosis Date  . Arthritis    "left shoulder" (07/31/2016)  . Coronary artery disease   . GERD (gastroesophageal reflux disease)   . Heart murmur   . High cholesterol   . Hypertension   . Multiple myeloma ([place])   . Sleep apnea    "probably; having test in November" (07/31/2016)  . Type II diabetes mellitus ([place])     SURGICAL HISTORY: Past Surgical History:  Procedure Laterality Date  . CARDIAC CATHETERIZATION N/A 07/31/2016   Procedure: Left Heart Cath and Coronary Angiography;   Surgeon: Lorretta Harp, MD;  Location: [facility];  Service: Cardiovascular;  Laterality: N/A;  . CARDIAC CATHETERIZATION N/A 07/31/2016   Procedure: Coronary Stent Intervention;  Surgeon: Lorretta Harp, MD;  Location: [facility];  Service: Cardiovascular;  Laterality: N/A;  . CORONARY ANGIOPLASTY    . OPEN REDUCTION INTERNAL FIXATION (ORIF) METACARPAL Left 05/21/2018   Procedure: OPEN REDUCTION INTERNAL FIXATION (ORIF) 2ND METACARPAL;  Surgeon: Shona Needles, MD;  Location: [facility];  Service: Orthopedics;  Laterality: Left;  . ORIF CLAVICULAR FRACTURE Right 05/21/2018   Procedure: OPEN REDUCTION INTERNAL FIXATION (ORIF) CLAVICULAR FRACTURE;  Surgeon: Shona Needles, MD;  Location: [facility];  Service: Orthopedics;  Laterality: Right;  . SHOULDER SURGERY Left 1973   "put pin in it where it had separated"   . TONSILLECTOMY  ~ 1956    SOCIAL HISTORY: Social History   Socioeconomic History  . Marital status: Divorced    Spouse name: Not on file  . Number of children: Not on file  . Years of education: Not on file  . Highest education level: Not on file  Occupational History  . Not on file  Tobacco Use  . Smoking status: Never Smoker  . Smokeless tobacco: Never Used  Vaping Use  . Vaping Use: Never used  Substance and Sexual Activity  . Alcohol use: Yes    Comment: 07/31/2016 "nothing since 2002"  .  Drug use: No  . Sexual activity: Not Currently  Other Topics Concern  . Not on file  Social History Narrative  . Not on file   Social Determinants of Health   Financial Resource Strain: Not on file  Food Insecurity: Not on file  Transportation Needs: Not on file  Physical Activity: Not on file  Stress: Not on file  Social Connections: Not on file  Intimate Partner Violence: Not on file    FAMILY HISTORY: Family History  Problem Relation Age of Onset  . Hypertension Other     ALLERGIES:  has No Known Allergies.  MEDICATIONS:  Current Outpatient  Medications  Medication Sig Dispense Refill  . acyclovir (ZOVIRAX) 400 MG tablet Take 1 tablet by mouth twice daily 60 tablet 0  . amLODipine (NORVASC) 5 MG tablet Take 5 mg by mouth daily.    [name] atorvastatin (LIPITOR) 40 MG tablet Take 1 tablet (40 mg total) by mouth at bedtime. 30 tablet 12  . carvedilol (COREG) 12.5 MG tablet Take 12.5 mg by mouth 2 (two) times daily with a meal.    . clopidogrel (PLAVIX) 75 MG tablet Take 1 tablet (75 mg total) by mouth daily with breakfast. 30 tablet 12  . cyclobenzaprine (FLEXERIL) 10 MG tablet Take 10 mg by mouth daily as needed for muscle spasms.   2  . escitalopram (LEXAPRO) 10 MG tablet Take 10 mg by mouth daily.    . furosemide (LASIX) 40 MG tablet Take 40 mg by mouth.    . isosorbide mononitrate (IMDUR) 30 MG 24 hr tablet Take 1 tablet (30 mg total) by mouth daily. 90 tablet 3  . montelukast (SINGULAIR) 10 MG tablet Take 10 mg by mouth daily.    [name] NINLARO 3 MG capsule TAKE 1 CAPSULE (3 MG TOTAL) BY MOUTH ONCE A WEEK. ON DAY 1,8,15 EVERY 28 DAYS. TAKE ON AN EMPTY STOMACH 1HR BEFORE OR 2HRS AFTER FOOD. 3 capsule 1  . omeprazole (PRILOSEC) 40 MG capsule Take 40 mg by mouth daily with breakfast.     . oxyCODONE-acetaminophen (PERCOCET) 10-325 MG tablet Take 1 tablet by mouth 2 (two) times daily. 10 tablet 0  . Potassium Chloride CR (MICRO-K) 8 MEQ CPCR capsule CR Take 8 mEq by mouth daily.    . SYMBICORT 160-4.5 MCG/ACT inhaler Inhale 2 puffs into the lungs 2 (two) times daily as needed (shortness of breath).     . Vitamin D, Ergocalciferol, (DRISDOL) 1.25 MG (50000 UNIT) CAPS capsule Take 1 capsule by mouth once a week 12 capsule 0  . warfarin (COUMADIN) 5 MG tablet Take 5 mg by mouth daily.     No current facility-administered medications for this visit.   Facility-Administered Medications Ordered in Other Visits  Medication Dose Route Frequency Provider Last Rate Last Admin  . 0.9 %  sodium chloride infusion   Intravenous Continuous Brunetta Genera, MD 10 mL/hr at 07/09/17 1423 New Bag at 07/09/17 1423    REVIEW OF SYSTEMS:   A 10+ POINT REVIEW OF SYSTEMS WAS OBTAINED including neurology, dermatology, psychiatry, cardiac, respiratory, lymph, extremities, GI, GU, Musculoskeletal, constitutional, breasts, reproductive, HEENT.  All pertinent positives are noted in the HPI.  All others are negative.   PHYSICAL EXAMINATION:  ECOG FS:2 - Symptomatic, <50% confined to bed  Vitals:   10/01/20 1127  BP: 115/67  Pulse: 61  Resp: 18  Temp: (!) 97.3 F (36.3 C)  SpO2: 99%   Wt Readings from Last 3 Encounters:  10/01/20 226  lb 11.2 oz (102.8 kg)  09/28/20 223 lb 6.4 oz (101.3 kg)  07/31/20 228 lb 8 oz (103.6 kg)   Body mass index is 32.53 kg/m.    Exam was given in a chair   GENERAL:alert, in no acute distress and comfortable SKIN: no acute rashes, no significant lesions EYES: conjunctiva are pink and non-injected, sclera anicteric OROPHARYNX: MMM, no exudates, no oropharyngeal erythema or ulceration NECK: supple, no JVD LYMPH:  no palpable lymphadenopathy in the cervical, axillary or inguinal regions LUNGS: clear to auscultation b/l with normal respiratory effort HEART: regular rate & rhythm ABDOMEN:  normoactive bowel sounds , non tender, not distended. No palpable hepatosplenomegaly.  Extremity: no pedal edema PSYCH: alert & oriented x 3 with fluent speech NEURO: no focal motor/sensory deficits  LABORATORY DATA:  I have reviewed the data as listed . CBC Latest Ref Rng & Units 10/01/2020 07/31/2020 05/29/2020  WBC 4.0 - 10.5 K/uL 4.9 3.5(L) 3.5(L)  Hemoglobin 13.0 - 17.0 g/dL 10.4(L) 10.3(L) 11.4(L)  Hematocrit 39.0 - 52.0 % 33.8(L) 34.0(L) 37.6(L)  Platelets 150 - 400 K/uL 136(L) 72(L) 86(L)   . CBC    Component Value Date/Time   WBC 4.9 10/01/2020 1007   RBC 3.73 (L) 10/01/2020 1007   HGB 10.4 (L) 10/01/2020 1007   HGB 11.3 (L) 01/25/2020 1000   HGB 9.2 (L) 10/01/2017 1112   HCT 33.8 (L) 10/01/2020 1007    HCT 29.3 (L) 10/01/2017 1112   PLT 136 (L) 10/01/2020 1007   PLT 147 (L) 01/25/2020 1000   PLT 170 10/01/2017 1112   MCV 90.6 10/01/2020 1007   MCV 88.5 10/01/2017 1112   MCH 27.9 10/01/2020 1007   MCHC 30.8 10/01/2020 1007   RDW 16.9 (H) 10/01/2020 1007   RDW 15.3 (H) 10/01/2017 1112   LYMPHSABS 0.5 (L) 10/01/2020 1007   LYMPHSABS 0.5 (L) 10/01/2017 1112   MONOABS 0.4 10/01/2020 1007   MONOABS 0.5 10/01/2017 1112   EOSABS 0.2 10/01/2020 1007   EOSABS 0.1 10/01/2017 1112   BASOSABS 0.0 10/01/2020 1007   BASOSABS 0.0 10/01/2017 1112    . CMP Latest Ref Rng & Units 10/01/2020 07/31/2020 05/29/2020  Glucose 70 - 99 mg/dL 115(H) 103(H) 104(H)  BUN 8 - 23 mg/dL 32(H) 33(H) 34(H)  Creatinine 0.61 - 1.24 mg/dL 1.76(H) 1.57(H) 1.72(H)  Sodium 135 - 145 mmol/L 143 144 143  Potassium 3.5 - 5.1 mmol/L 4.1 4.1 4.1  Chloride 98 - 111 mmol/L 113(H) 110 110  CO2 22 - 32 mmol/L _0 Calcium 8.9 - 10.3 mg/dL 9.2 9.2 9.5  Total Protein 6.5 - 8.1 g/dL 6.9 6.9 7.1  Total Bilirubin 0.3 - 1.2 mg/dL 0.9 0.9 0.9  Alkaline Phos 38 - 126 U/L 76 84 81  AST 15 - 41 U/L _1 ALT 0 - 44 U/L _2 03/18/2019 MMP: Component     Latest Ref Rng & Units 03/18/2019  IgG (Immunoglobin G), Serum     603 - 1,613 mg/dL 1,012  IgA     61 - 437 mg/dL 51 (L)  IgM (Immunoglobulin M), Srm     15 - 143 mg/dL 27  Total Protein ELP     6.0 - 8.5 g/dL 6.3  Albumin SerPl Elph-Mcnc     2.9 - 4.4 g/dL 3.8  Alpha 1     0.0 - 0.4 g/dL 0.2  Alpha2 Glob SerPl Elph-Mcnc     0.4 - 1.0 g/dL 0.7  B-Globulin SerPl  Elph-Mcnc     0.7 - 1.3 g/dL 0.8  Gamma Glob SerPl Elph-Mcnc     0.4 - 1.8 g/dL 0.8  M Protein SerPl Elph-Mcnc     Not Observed g/dL Not Observed  Globulin, Total     2.2 - 3.9 g/dL 2.5  Albumin/Glob SerPl     0.7 - 1.7 1.6  IFE 1      Comment  Please Note (HCV):      Comment    RADIOGRAPHIC STUDIES: I have personally reviewed the radiological images as listed and agreed with the  findings in the report. No results found.  ASSESSMENT & PLAN:   74 y.o. 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  K/L 95, No M spike on SPEP -- suggests light chain MM Cytogenetics - normal male chromosomes FISH- +11 and 13q-/-13  Patient is s/p 1 cycle of Vd and Serum free kappa LC has decreased from 700 to 203.6 with improvement in his K/L ratio from 94.59 to 29. Completed 2nd cycle of treatment with Vd + Cytoxan (275m/m2) Completed 3rd cycle of treatment with Vd + Cytoxan (2076mm2) - cytoxan held D15 due to thrombocytopenia Completed 4th of VCd Discontinued VCd after C5D8 due to intolerance with diarrhea and increasing neuropathy and fatigue with borderline functional status. -Patient did not tolerate maintenance Revlimid 10 mg by mouth daily due to grade 2-3 diarrhea. This was discontinued and his diarrhea has resolved.  Currently on maintenance Ninlaro M spike absent   11/15/18 CT Pelvic revealed  3 mm stone in the right UVJ. 5 x 6 mm stone is identified in the left UVJ. No associated distal hydroureter. 2. Stable lucent lesion in the right iliac bone. 3. Small right groin hernia contains only fat. 11/15/18 CT Lumbar revealed  No acute fracture or malalignment. 2. Severe osteopenia and multiple old compression fractures, severe at T11 and T12. 3. Severe canal stenosis L3-4. Multilevel neural foraminal narrowing: Moderate to severe at T12-L1. 4. Slowly enlarging solid  LEFT renal mass. Recommend non emergent MRI renal mass protocol on non emergent basis -No obvious new myeloma involvement seen in most recent CT imaging.         2) CAD s/p prox LAD DES PCI on 07/31/2016 with ischemic cardiomyopathy ejection fraction of 25-35%. Rpt ECHO on 10/03/2016 shows improvement in EF to 55-60% with no RWMABN.  3) Macrocytic Anemia with moderate thrombocytopenia - due to MM and and Ninlaro. Stable.  4) Thrombocytopenia - due to MM and treatment --improved today 108k. --will monitor  5) B12 deficiency - Plan -cont SL B12 100055mpo daily  6) h/o Afib with RVR on coumadin Some bradycardia with BB -continue mx per PCP and cardiology  7) Back pain -- multiple chronic compression fractures with some worsening -On bisphosphonates already -Ergocalciferol 50k units weekly -Advised that the pt not lift anything heavy  -Discussed the 11/15/18 CT imaging, as noted above, did refer pt to orthopedist -Pt has been evaluated by orthopedist in December 2020  PLAN:  -Discussed pt labwork today, 10/01/20; blood counts are nml, PLT  have improved, kidney function is steady.  -Discussed 10/01/20 MMP & K/L light chains are in progress. 07/31/2020 K/L light chain ratio continues to trend up at 4.77. -The pt has no prohibitive toxicities from continuing Ninlaro at this time. -If light chains continue to increase may consider adding in Cytoxan.  -Will continue to balance Myeloma treatment with pt's other medical conditions.  -Will hold Aredia today to prevent excess fluid collection. Plan to restart at next visit. -Recommend pt f/u Cardiology & associated tests as scheduled. -Continue weekly Ergocalciferol  -Will get PET/CT in 6 weeks -Will see back in 2 months with labs    FOLLOW UP: Hold Pamidronate infusion today PET/CT in 6 weeks RTC with Dr Irene Limbo with labs and next Pamidronate infusion in 2 months   The total time spent in the appt was 20 minutes and more than 50% was  on counseling and direct patient cares.  All of the patient's questions were answered with apparent satisfaction. The patient knows to call the clinic with any problems, questions or concerns.   Sullivan Lone MD Silt AAHIVMS [facility] Hematology/Oncology Physician [facility]  (Office):       [phone] (Work cell):  [phone] (Fax):           [phone]  I, Yevette Edwards, am acting as a scribe for Dr. Sullivan Lone.   .I have reviewed the above documentation for accuracy and completeness, and I agree with the above. Brunetta Genera MD

## 2020-10-02 LAB — KAPPA/LAMBDA LIGHT CHAINS
Kappa free light chain: 76.3 mg/L — ABNORMAL HIGH (ref 3.3–19.4)
Kappa, lambda light chain ratio: 3.89 — ABNORMAL HIGH (ref 0.26–1.65)
Lambda free light chains: 19.6 mg/L (ref 5.7–26.3)

## 2020-10-03 ENCOUNTER — Other Ambulatory Visit: Payer: Self-pay | Admitting: Hematology

## 2020-10-03 DIAGNOSIS — C9000 Multiple myeloma not having achieved remission: Secondary | ICD-10-CM

## 2020-10-04 LAB — MULTIPLE MYELOMA PANEL, SERUM
Albumin SerPl Elph-Mcnc: 3.6 g/dL (ref 2.9–4.4)
Albumin/Glob SerPl: 1.3 (ref 0.7–1.7)
Alpha 1: 0.2 g/dL (ref 0.0–0.4)
Alpha2 Glob SerPl Elph-Mcnc: 0.6 g/dL (ref 0.4–1.0)
B-Globulin SerPl Elph-Mcnc: 0.9 g/dL (ref 0.7–1.3)
Gamma Glob SerPl Elph-Mcnc: 1.2 g/dL (ref 0.4–1.8)
Globulin, Total: 2.8 g/dL (ref 2.2–3.9)
IgA: 69 mg/dL (ref 61–437)
IgG (Immunoglobin G), Serum: 1296 mg/dL (ref 603–1613)
IgM (Immunoglobulin M), Srm: 29 mg/dL (ref 15–143)
Total Protein ELP: 6.4 g/dL (ref 6.0–8.5)

## 2020-10-04 NOTE — Telephone Encounter (Signed)
Please review for refill.  

## 2020-10-23 ENCOUNTER — Other Ambulatory Visit: Payer: Self-pay | Admitting: Hematology

## 2020-10-23 DIAGNOSIS — C9000 Multiple myeloma not having achieved remission: Secondary | ICD-10-CM

## 2020-10-24 ENCOUNTER — Other Ambulatory Visit: Payer: Self-pay

## 2020-10-24 ENCOUNTER — Ambulatory Visit (HOSPITAL_COMMUNITY): Payer: Medicare HMO | Attending: Cardiology

## 2020-10-24 DIAGNOSIS — R0609 Other forms of dyspnea: Secondary | ICD-10-CM

## 2020-10-24 DIAGNOSIS — R0600 Dyspnea, unspecified: Secondary | ICD-10-CM | POA: Diagnosis not present

## 2020-10-24 DIAGNOSIS — Z9861 Coronary angioplasty status: Secondary | ICD-10-CM | POA: Insufficient documentation

## 2020-10-24 DIAGNOSIS — I2510 Atherosclerotic heart disease of native coronary artery without angina pectoris: Secondary | ICD-10-CM | POA: Diagnosis present

## 2020-10-24 LAB — ECHOCARDIOGRAM COMPLETE
Area-P 1/2: 5.02 cm2
MV M vel: 4.67 m/s
MV Peak grad: 87 mmHg
P 1/2 time: 731 msec
S' Lateral: 3.9 cm

## 2020-10-25 MED FILL — NINLARO 3 MG CAPS: 3 | 28 days supply | Qty: 3 | Fill #0

## 2020-10-26 ENCOUNTER — Telehealth (HOSPITAL_COMMUNITY): Payer: Self-pay | Admitting: *Deleted

## 2020-10-26 NOTE — Telephone Encounter (Signed)
Close encounter 

## 2020-10-30 ENCOUNTER — Ambulatory Visit (HOSPITAL_COMMUNITY)
Admission: RE | Admit: 2020-10-30 | Discharge: 2020-10-30 | Disposition: A | Discharge status: Home / Self Care | Payer: Medicare HMO | Source: Ambulatory Visit | Attending: Cardiovascular Disease | Admitting: Cardiovascular Disease

## 2020-10-30 ENCOUNTER — Other Ambulatory Visit: Payer: Self-pay

## 2020-10-30 DIAGNOSIS — Z9861 Coronary angioplasty status: Secondary | ICD-10-CM

## 2020-10-30 DIAGNOSIS — R0600 Dyspnea, unspecified: Secondary | ICD-10-CM | POA: Diagnosis not present

## 2020-10-30 DIAGNOSIS — R0609 Other forms of dyspnea: Secondary | ICD-10-CM

## 2020-10-30 DIAGNOSIS — I2510 Atherosclerotic heart disease of native coronary artery without angina pectoris: Secondary | ICD-10-CM | POA: Insufficient documentation

## 2020-10-30 LAB — MYOCARDIAL PERFUSION IMAGING
Peak HR: 75 {beats}/min
Rest HR: 68 {beats}/min
SDS: 0
SRS: 2
SSS: 2
TID: 1.14

## 2020-10-30 MED ORDER — AMINOPHYLLINE 25 MG/ML IV SOLN: 75.0000 mg | Freq: Once | INTRAVENOUS | Status: DC

## 2020-10-30 MED ORDER — REGADENOSON 0.4 MG/5ML IV SOLN
0.4000 mg | Freq: Once | INTRAVENOUS | Status: AC
  Administered 2020-10-30: 0.4 mg via INTRAVENOUS

## 2020-10-30 MED ORDER — TECHNETIUM TC 99M TETROFOSMIN IV KIT
9.8000 | PACK | Freq: Once | INTRAVENOUS | Status: AC | PRN
  Administered 2020-10-30: 9.8 via INTRAVENOUS
  Filled 2020-10-30: qty 10

## 2020-10-30 MED ORDER — TECHNETIUM TC 99M TETROFOSMIN IV KIT
29.8000 | PACK | Freq: Once | INTRAVENOUS | Status: AC | PRN
  Administered 2020-10-30: 29.8 via INTRAVENOUS
  Filled 2020-10-30: qty 30

## 2020-11-08 ENCOUNTER — Ambulatory Visit: Payer: Medicare HMO | Admitting: Cardiology

## 2020-11-08 ENCOUNTER — Other Ambulatory Visit: Payer: Self-pay

## 2020-11-08 ENCOUNTER — Encounter: Payer: Self-pay | Admitting: Cardiology

## 2020-11-08 VITALS — BP 132/84 | HR 69 | Ht 69.0 in | Wt 224.8 lb

## 2020-11-08 DIAGNOSIS — I2510 Atherosclerotic heart disease of native coronary artery without angina pectoris: Secondary | ICD-10-CM

## 2020-11-08 DIAGNOSIS — I482 Chronic atrial fibrillation, unspecified: Secondary | ICD-10-CM

## 2020-11-08 DIAGNOSIS — R0600 Dyspnea, unspecified: Secondary | ICD-10-CM

## 2020-11-08 DIAGNOSIS — R0609 Other forms of dyspnea: Secondary | ICD-10-CM

## 2020-11-08 DIAGNOSIS — I5032 Chronic diastolic (congestive) heart failure: Secondary | ICD-10-CM | POA: Diagnosis not present

## 2020-11-08 DIAGNOSIS — I10 Essential (primary) hypertension: Secondary | ICD-10-CM

## 2020-11-08 DIAGNOSIS — Z7901 Long term (current) use of anticoagulants: Secondary | ICD-10-CM

## 2020-11-08 DIAGNOSIS — Z9861 Coronary angioplasty status: Secondary | ICD-10-CM

## 2020-11-08 DIAGNOSIS — N1832 Chronic kidney disease, stage 3b: Secondary | ICD-10-CM

## 2020-11-08 DIAGNOSIS — C9000 Multiple myeloma not having achieved remission: Secondary | ICD-10-CM

## 2020-11-08 MED ORDER — FUROSEMIDE 80 MG PO TABS: 80.0000 mg | ORAL_TABLET | Freq: Every day | ORAL | 3 refills | Status: DC
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
## 2020-11-08 NOTE — Patient Instructions (Signed)
Medication Instructions:  Start Furosemide 80 mg ( 1 Tablet on Monday, Wednesday, and Friday *If you need a refill on your cardiac medications before your next appointment, please call your pharmacy*   Lab Work: BMP (To be done with PCP Please Fax results to our office) If you have labs (blood work) drawn today and your tests are completely normal, you will receive your results only by: [name] MyChart Message (if you have MyChart) OR . A paper copy in the mail If you have any lab test that is abnormal or we need to change your treatment, we will call you to review the results.   Testing/Procedures: No Testing   Follow-Up: At [facility], you and your health needs are our priority.  As part of our continuing mission to provide you with exceptional heart care, we have created designated Provider Care Teams.  These Care Teams include your primary Cardiologist (physician) and Advanced Practice Providers (APPs -  Physician Assistants and Nurse Practitioners) who all work together to provide you with the care you need, when you need it.  Your next appointment:   1 month(s)  The format for your next appointment:   In Person  Provider:   Quay Burow, MD

## 2020-11-12 ENCOUNTER — Other Ambulatory Visit: Payer: Self-pay | Admitting: Hematology

## 2020-11-12 DIAGNOSIS — C9000 Multiple myeloma not having achieved remission: Secondary | ICD-10-CM

## 2020-11-12 NOTE — Telephone Encounter (Signed)
Please review for refill.  

## 2020-11-19 ENCOUNTER — Encounter (HOSPITAL_COMMUNITY)
Admission: RE | Admit: 2020-11-19 | Discharge: 2020-11-19 | Disposition: A | Discharge status: Home / Self Care | Payer: Medicare HMO | Source: Ambulatory Visit | Attending: Hematology | Admitting: Hematology

## 2020-11-19 ENCOUNTER — Other Ambulatory Visit: Payer: Self-pay

## 2020-11-19 DIAGNOSIS — N200 Calculus of kidney: Secondary | ICD-10-CM | POA: Insufficient documentation

## 2020-11-19 DIAGNOSIS — N400 Enlarged prostate without lower urinary tract symptoms: Secondary | ICD-10-CM | POA: Diagnosis not present

## 2020-11-19 DIAGNOSIS — I2510 Atherosclerotic heart disease of native coronary artery without angina pectoris: Secondary | ICD-10-CM | POA: Insufficient documentation

## 2020-11-19 DIAGNOSIS — N210 Calculus in bladder: Secondary | ICD-10-CM | POA: Insufficient documentation

## 2020-11-19 DIAGNOSIS — K8020 Calculus of gallbladder without cholecystitis without obstruction: Secondary | ICD-10-CM | POA: Insufficient documentation

## 2020-11-19 DIAGNOSIS — I700 Atherosclerosis of aorta: Secondary | ICD-10-CM | POA: Diagnosis not present

## 2020-11-19 DIAGNOSIS — C9000 Multiple myeloma not having achieved remission: Secondary | ICD-10-CM | POA: Diagnosis not present

## 2020-11-19 LAB — GLUCOSE, CAPILLARY: Glucose-Capillary: 103 mg/dL — ABNORMAL HIGH (ref 70–99)

## 2020-11-19 MED ORDER — FLUDEOXYGLUCOSE F - 18 (FDG) INJECTION
11.1800 | Freq: Once | INTRAVENOUS | Status: AC
  Administered 2020-11-19: 11.18 via INTRAVENOUS

## 2020-11-21 MED FILL — NINLARO 3 MG CAPS: 3 | 28 days supply | Qty: 3 | Fill #1

## 2020-11-26 ENCOUNTER — Telehealth: Payer: Self-pay | Admitting: Hematology

## 2020-11-26 NOTE — Telephone Encounter (Signed)
Called patient regarding upcoming 02/14 appointment, spoke with patient's wife and he will be notified.

## 2020-12-02 NOTE — Progress Notes (Signed)
HEMATOLOGY/ONCOLOGY CLINIC NOTE  Date of Service: 12/02/20    Patient Care Team: Sandi Mariscal, MD as PCP - General (Internal Medicine) Lorretta Harp, MD as PCP - Cardiology (Cardiology)  CHIEF COMPLAINTS/PURPOSE OF CONSULTATION:   F/u for continued management of Myeloma   HISTORY OF PRESENTING ILLNESS:  plz see previous note for details on HPI  INTERVAL HISTORY: Richard Vang is here for his scheduled followup for multiple myeloma. The patient's last visit with [name] was on 10/01/2020. The pt reports that he is doing well overall.  The pt reports no new symptoms or concerns. He notes that he no longer has leg swelling, but feels as though it is swelling. The pt visited his Cardiologist, who conducted a stress test and ECHO that showed some dysfunction in his right heart. He has a f/u on March 4. The pt is currently on 80 mg Lasix daily and notes he pees frequently each day. This has helped his breathing. The pt reports he has fatty tissue buildup on his right eye that he is following up with his eye doctor about. He has a nodule on the bottom of his right eye.  He believes they told him it was a pterygium.  Of note since the patient's last visit, pt has had NM PET Whole Body ([id number]) on 11/19/2020, which revealed "1. Intensely hypermetabolic lytic lesion in the left iliac wing, consistent with the given history of multiple myeloma. 2. Mildly hypermetabolic old bilateral rib fractures, some of which are slightly expansile in appearance and may have been pathologic in etiology. 3. Liver appears cirrhotic. 4. Cholelithiasis. 5. Bilateral nephrolithiasis.  Bladder stone. 6. Enlarged prostate. 7. Aortic atherosclerosis (ICD10-I70.0). Coronary artery calcification."  Lab results today 12/03/2020 of CBC w/diff and CMP is as follows: all values are WNL except for Hgb of 11.6, MCHC of 29.7, RDW of 17.5, Plt of 106K, Lymphs Abs of 0.5K, BUN of 31, Creatinine of 1.93, GFR est of 36. 12/03/2020 Light  chains in progress.  On review of systems, pt reports right eye nodule, back pain and denies SOB, leg swelling abdominal pain, eye pain and any other symptoms.  MEDICAL HISTORY:  Past Medical History:  Diagnosis Date  . Arthritis    "left shoulder" (07/31/2016)  . Coronary artery disease   . GERD (gastroesophageal reflux disease)   . Heart murmur   . High cholesterol   . Hypertension   . Multiple myeloma ([place])   . Sleep apnea    "probably; having test in November" (07/31/2016)  . Type II diabetes mellitus ([place])     SURGICAL HISTORY: Past Surgical History:  Procedure Laterality Date  . CARDIAC CATHETERIZATION N/A 07/31/2016   Procedure: Left Heart Cath and Coronary Angiography;  Surgeon: Lorretta Harp, MD;  Location: [facility];  Service: Cardiovascular;  Laterality: N/A;  . CARDIAC CATHETERIZATION N/A 07/31/2016   Procedure: Coronary Stent Intervention;  Surgeon: Lorretta Harp, MD;  Location: [facility];  Service: Cardiovascular;  Laterality: N/A;  . CORONARY ANGIOPLASTY    . OPEN REDUCTION INTERNAL FIXATION (ORIF) METACARPAL Left 05/21/2018   Procedure: OPEN REDUCTION INTERNAL FIXATION (ORIF) 2ND METACARPAL;  Surgeon: Shona Needles, MD;  Location: [facility];  Service: Orthopedics;  Laterality: Left;  . ORIF CLAVICULAR FRACTURE Right 05/21/2018   Procedure: OPEN REDUCTION INTERNAL FIXATION (ORIF) CLAVICULAR FRACTURE;  Surgeon: Shona Needles, MD;  Location: [facility];  Service: Orthopedics;  Laterality: Right;  . SHOULDER SURGERY Left 1973   "  put pin in it where it had separated"   . TONSILLECTOMY  ~ 1956    SOCIAL HISTORY: Social History   Socioeconomic History  . Marital status: Divorced    Spouse name: Not on file  . Number of children: Not on file  . Years of education: Not on file  . Highest education level: Not on file  Occupational History  . Not on file  Tobacco Use  . Smoking status: Never Smoker  . Smokeless tobacco: Never Used  Vaping Use  .  Vaping Use: Never used  Substance and Sexual Activity  . Alcohol use: Yes    Comment: 07/31/2016 "nothing since 2002"  . Drug use: No  . Sexual activity: Not Currently  Other Topics Concern  . Not on file  Social History Narrative  . Not on file   Social Determinants of Health   Financial Resource Strain: Not on file  Food Insecurity: Not on file  Transportation Needs: Not on file  Physical Activity: Not on file  Stress: Not on file  Social Connections: Not on file  Intimate Partner Violence: Not on file    FAMILY HISTORY: Family History  Problem Relation Age of Onset  . Hypertension Other     ALLERGIES:  has No Known Allergies.  MEDICATIONS:  Current Outpatient Medications  Medication Sig Dispense Refill  . acyclovir (ZOVIRAX) 400 MG tablet Take 1 tablet by mouth twice daily 60 tablet 0  . amLODipine (NORVASC) 5 MG tablet Take 5 mg by mouth daily.    [name] atorvastatin (LIPITOR) 40 MG tablet Take 1 tablet (40 mg total) by mouth at bedtime. 30 tablet 12  . carvedilol (COREG) 12.5 MG tablet Take 12.5 mg by mouth 2 (two) times daily with a meal.    . clopidogrel (PLAVIX) 75 MG tablet Take 1 tablet (75 mg total) by mouth daily with breakfast. 30 tablet 12  . cyclobenzaprine (FLEXERIL) 10 MG tablet Take 10 mg by mouth daily as needed for muscle spasms.   2  . escitalopram (LEXAPRO) 10 MG tablet Take 10 mg by mouth daily.    . furosemide (LASIX) 80 MG tablet Take 1 tablet (80 mg total) by mouth daily. Take 1 Tablet on Monday,Wendsday, and Friday 90 tablet 3  . isosorbide mononitrate (IMDUR) 30 MG 24 hr tablet Take 1 tablet (30 mg total) by mouth daily. 90 tablet 3  . montelukast (SINGULAIR) 10 MG tablet Take 10 mg by mouth daily.    [name] NINLARO 3 MG capsule TAKE 1 CAPSULE (3 MG TOTAL) BY MOUTH ONCE A WEEK. ON DAY 1,8,15 EVERY 28 DAYS. TAKE ON AN EMPTY STOMACH 1HR BEFORE OR 2HRS AFTER FOOD. 3 capsule 1  . omeprazole (PRILOSEC) 40 MG capsule Take 40 mg by mouth daily with breakfast.      . oxyCODONE-acetaminophen (PERCOCET) 10-325 MG tablet Take 1 tablet by mouth 2 (two) times daily. 10 tablet 0  . Potassium Chloride CR (MICRO-K) 8 MEQ CPCR capsule CR Take 8 mEq by mouth daily.    . SYMBICORT 160-4.5 MCG/ACT inhaler Inhale 2 puffs into the lungs 2 (two) times daily as needed (shortness of breath).     . Vitamin D, Ergocalciferol, (DRISDOL) 1.25 MG (50000 UNIT) CAPS capsule Take 1 capsule by mouth once a week 12 capsule 0  . warfarin (COUMADIN) 5 MG tablet Take 5 mg by mouth daily.     No current facility-administered medications for this visit.   Facility-Administered Medications Ordered in Other Visits  Medication Dose  Route Frequency Provider Last Rate Last Admin  . 0.9 %  sodium chloride infusion   Intravenous Continuous Brunetta Genera, MD 10 mL/hr at 07/09/17 1423 New Bag at 07/09/17 1423    REVIEW OF SYSTEMS:   10 Point review of Systems was done is negative except as noted above.  PHYSICAL EXAMINATION:  ECOG FS:2 - Symptomatic, <50% confined to bed  There were no vitals filed for this visit. Wt Readings from Last 3 Encounters:  11/08/20 224 lb 12.8 oz (102 kg)  10/30/20 226 lb (102.5 kg)  10/01/20 226 lb 11.2 oz (102.8 kg)   There is no height or weight on file to calculate BMI.    Exam was given in a chair.  GENERAL:alert, in no acute distress and comfortable SKIN: no acute rashes, no significant lesions EYES: conjunctiva are pink and non-injected, sclera anicteric OROPHARYNX: MMM, no exudates, no oropharyngeal erythema or ulceration NECK: supple, no JVD LYMPH:  no palpable lymphadenopathy in the cervical, axillary or inguinal regions LUNGS: clear to auscultation b/l with normal respiratory effort HEART: regular rate & rhythm ABDOMEN:  normoactive bowel sounds , non tender, not distended. Extremity: no pedal edema PSYCH: alert & oriented x 3 with fluent speech NEURO: no focal motor/sensory deficits  LABORATORY DATA:  I have reviewed the data  as listed  CBC Latest Ref Rng & Units 10/01/2020 07/31/2020 05/29/2020  WBC 4.0 - 10.5 K/uL 4.9 3.5(L) 3.5(L)  Hemoglobin 13.0 - 17.0 g/dL 10.4(L) 10.3(L) 11.4(L)  Hematocrit 39.0 - 52.0 % 33.8(L) 34.0(L) 37.6(L)  Platelets 150 - 400 K/uL 136(L) 72(L) 86(L)    CBC    Component Value Date/Time   WBC 4.9 10/01/2020 1007   RBC 3.73 (L) 10/01/2020 1007   HGB 10.4 (L) 10/01/2020 1007   HGB 11.3 (L) 01/25/2020 1000   HGB 9.2 (L) 10/01/2017 1112   HCT 33.8 (L) 10/01/2020 1007   HCT 29.3 (L) 10/01/2017 1112   PLT 136 (L) 10/01/2020 1007   PLT 147 (L) 01/25/2020 1000   PLT 170 10/01/2017 1112   MCV 90.6 10/01/2020 1007   MCV 88.5 10/01/2017 1112   MCH 27.9 10/01/2020 1007   MCHC 30.8 10/01/2020 1007   RDW 16.9 (H) 10/01/2020 1007   RDW 15.3 (H) 10/01/2017 1112   LYMPHSABS 0.5 (L) 10/01/2020 1007   LYMPHSABS 0.5 (L) 10/01/2017 1112   MONOABS 0.4 10/01/2020 1007   MONOABS 0.5 10/01/2017 1112   EOSABS 0.2 10/01/2020 1007   EOSABS 0.1 10/01/2017 1112   BASOSABS 0.0 10/01/2020 1007   BASOSABS 0.0 10/01/2017 1112    . CMP Latest Ref Rng & Units 10/01/2020 07/31/2020 05/29/2020  Glucose 70 - 99 mg/dL 115(H) 103(H) 104(H)  BUN 8 - 23 mg/dL 32(H) 33(H) 34(H)  Creatinine 0.61 - 1.24 mg/dL 1.76(H) 1.57(H) 1.72(H)  Sodium 135 - 145 mmol/L 143 144 143  Potassium 3.5 - 5.1 mmol/L 4.1 4.1 4.1  Chloride 98 - 111 mmol/L 113(H) 110 110  CO2 22 - 32 mmol/L '22 27 23  ' Calcium 8.9 - 10.3 mg/dL 9.2 9.2 9.5  Total Protein 6.5 - 8.1 g/dL 6.9 6.9 7.1  Total Bilirubin 0.3 - 1.2 mg/dL 0.9 0.9 0.9  Alkaline Phos 38 - 126 U/L 76 84 81  AST 15 - 41 U/L '26 21 24  ' ALT 0 - 44 U/L '16 13 13   ' 03/18/2019 MMP: Component     Latest Ref Rng & Units 03/18/2019  IgG (Immunoglobin G), Serum     603 - 1,613 mg/dL  1,012  IgA     61 - 437 mg/dL 51 (L)  IgM (Immunoglobulin M), Srm     15 - 143 mg/dL 27  Total Protein ELP     6.0 - 8.5 g/dL 6.3  Albumin SerPl Elph-Mcnc     2.9 - 4.4 g/dL 3.8  Alpha 1     0.0  - 0.4 g/dL 0.2  Alpha2 Glob SerPl Elph-Mcnc     0.4 - 1.0 g/dL 0.7  B-Globulin SerPl Elph-Mcnc     0.7 - 1.3 g/dL 0.8  Gamma Glob SerPl Elph-Mcnc     0.4 - 1.8 g/dL 0.8  M Protein SerPl Elph-Mcnc     Not Observed g/dL Not Observed  Globulin, Total     2.2 - 3.9 g/dL 2.5  Albumin/Glob SerPl     0.7 - 1.7 1.6  IFE 1      Comment  Please Note (HCV):      Comment    RADIOGRAPHIC STUDIES: I have personally reviewed the radiological images as listed and agreed with the findings in the report. NM PET Image Restage (PS) Whole Body  Result Date: 11/19/2020 CLINICAL DATA:  Initial treatment strategy for multiple myeloma. EXAM: NUCLEAR MEDICINE PET WHOLE BODY TECHNIQUE: 11.2 mCi F-18 FDG was injected intravenously. Full-ring PET imaging was performed from the head to foot after the radiotracer. CT data was obtained and used for attenuation correction and anatomic localization. Fasting blood glucose: 103 mg/dl COMPARISON:  CT lumbar spine and pelvis 11/15/2018, CT chest abdomen pelvis 05/19/2018. FINDINGS: Mediastinal blood pool activity: SUV max 3.4 HEAD/NECK: No abnormal hypermetabolism. Incidental CT findings: Mucosal thickening in the right ethmoid air cells. CHEST: No hypermetabolic mediastinal, hilar or axillary lymph nodes. No hypermetabolic pulmonary nodules. Incidental CT findings: Atherosclerotic calcification of the aorta, aortic valve and coronary arteries. Heart is enlarged. No pericardial or pleural effusion. Given image degradation by respiratory motion, lungs are grossly clear. ABDOMEN/PELVIS: No abnormal hypermetabolism in the liver, adrenal glands, spleen or pancreas. No hypermetabolic lymph nodes. Incidental CT findings: Liver margin is slightly irregular. Stones are seen in the gallbladder. Adrenal glands are unremarkable. Stones are seen in the kidneys bilaterally. A 1.5 cm low-attenuation lesion in the interpolar left kidney and 2.4 cm hyperdense lesion off the lower pole left kidney  are difficult to further characterize without post-contrast imaging. Visualized portions of the spleen, pancreas, stomach and bowel are unremarkable. Duodenal diverticula are incidentally noted. Coarse calcification in the small bowel mesentery (4/163), unchanged from 05/19/2018. Atherosclerotic calcification of the aorta. Prostate is enlarged. A 9 mm calcification is seen in the right posterolateral bladder. SKELETON: Intense hypermetabolism is seen within a lytic lesion in the inferomedial left iliac wing, measuring 2.2 cm, SUV max 28.4. Lesion is new from 11/15/2018. 3.9 cm lucent lesion in the right iliac wing is well-circumscribed, unchanged from 11/15/2018 and shows no hypermetabolism. Multiple mildly hypermetabolic old bilateral anterolateral rib fractures, some of which have an expansile appearance and may have been pathologic in etiology. Mild degenerative uptake in the spine. Incidental CT findings: Degenerative changes in the spine. Old right clavicle fracture repair. A screw is seen in the proximal left humerus. EXTREMITIES: No abnormal hypermetabolism. Incidental CT findings: None. IMPRESSION: 1. Intensely hypermetabolic lytic lesion in the left iliac wing, consistent with the given history of multiple myeloma. 2. Mildly hypermetabolic old bilateral rib fractures, some of which are slightly expansile in appearance and may have been pathologic in etiology. 3. Liver appears cirrhotic. 4. Cholelithiasis. 5. Bilateral nephrolithiasis.  Bladder  stone. 6. Enlarged prostate. 7. Aortic atherosclerosis (ICD10-I70.0). Coronary artery calcification. Electronically Signed   By: Lorin Picket M.D.   On: 11/19/2020 11:33    ASSESSMENT & PLAN:   75 y.o. male with multiple medical co-morbidities including hypertension, diabetes, dyslipidemia, coronary artery disease status post drug-eluting PCI on 07/31/2016 and newly noted possibly ischemic cardiomyopathy ejection fraction 25-35% (rpt ECHO improvement to  55-60%) with   1) Light chain (kappa) Multiple myeloma with Lytic lesion with aggressive features in the right iliac bone and possibly other lytic lesions in the L spine , anemia, hypercalcemia and renal insuff (diagnosed in 09/2016) bone marrow bx -- shows 67%-80 %plasma cells consistent with multiple myeloma.  K/L 95, No M spike on SPEP -- suggests light chain MM Cytogenetics - normal male chromosomes FISH- +11 and 13q-/-13  Patient is s/p 1 cycle of Vd and Serum free kappa LC has decreased from 700 to 203.6 with improvement in his K/L ratio from 94.59 to 29. Completed 2nd cycle of treatment with Vd + Cytoxan (228m/m2) Completed 3rd cycle of treatment with Vd + Cytoxan (2058mm2) - cytoxan held D15 due to thrombocytopenia Completed 4th of VCd Discontinued VCd after C5D8 due to intolerance with diarrhea and increasing neuropathy and fatigue with borderline functional status. -Patient did not tolerate maintenance Revlimid 10 mg by mouth daily due to grade 2-3 diarrhea. This was discontinued and his diarrhea has resolved.  Currently on maintenance Ninlaro M spike absent   11/15/18 CT Pelvic revealed  3 mm stone in the right UVJ. 5 x 6 mm stone is identified in the left UVJ. No associated distal hydroureter. 2. Stable lucent lesion in the right iliac bone. 3. Small right groin hernia contains only fat. 11/15/18 CT Lumbar revealed  No acute fracture or malalignment. 2. Severe osteopenia and multiple old compression fractures, severe at T11 and T12. 3. Severe canal stenosis L3-4. Multilevel neural foraminal narrowing: Moderate to severe at T12-L1. 4. Slowly enlarging solid LEFT renal mass. Recommend non emergent MRI renal mass protocol on non emergent basis -No obvious new myeloma involvement seen in most recent CT imaging.         2) CAD s/p prox LAD DES PCI on 07/31/2016 with ischemic cardiomyopathy ejection fraction of 25-35%. Rpt ECHO on 10/03/2016 shows improvement in EF to 55-60% with no  RWMABN.  3) Macrocytic Anemia with moderate thrombocytopenia - due to MM and and Ninlaro. Stable.  4) Thrombocytopenia - due to MM and treatment --improved today 108k. --will monitor  5) B12 deficiency - Plan -cont SL B12 100039mpo daily  6) h/o Afib with RVR on coumadin Some bradycardia with BB -continue mx per PCP and cardiology  7) Back pain -- multiple chronic compression fractures with some worsening -On bisphosphonates already -Ergocalciferol 50k units weekly -Advised that the pt not lift anything heavy  -Discussed the 11/15/18 CT imaging, as noted above, did refer pt to orthopedist -Pt has been evaluated by orthopedist in December 2020  PLAN:  -Discussed pt labwork today, 12/03/2020; mild thrombocytopenia, blood counts stable, chemistries stable. -Discussed pt last light chains in December 2021; stable.  -Discussed pt NM PET Whole Body ([id number] 11/19/2020; new bone lesion in the left iliac. -Advised pt e can send referral for radiating spot on left iliac/back pain. The pt wishes to consider this option. -Recommended pt f/u with eye doctor ASAP regarding nodule and blurriness in right eye. -The pt has no prohibitive toxicities from continuing Ninlaro at this time. -Will restart Aredia today. -  Continue weekly Ergocalciferol. -Will see back in 2 months with labs.   FOLLOW UP: -Refer to radiation oncology for possible palliative RT to left iliac bone:FDG avid myeloma lesion -Please schedule next 3 cycles of pamidronate 2 months -RTC with Dr Irene Limbo with labs and next Pamidronate infusion in 2 months   The total time spent in the appointment was 20 minutes and more than 50% was on counseling and direct patient cares.  All of the patient's questions were answered with apparent satisfaction. The patient knows to call the clinic with any problems, questions or concerns.   Sullivan Lone MD [place] AAHIVMS [facility] Hematology/Oncology Physician [facility]  (Office):       [phone] (Work cell):  [phone] (Fax):           [phone]  I, Reinaldo Raddle, am acting as scribe for Dr. Sullivan Lone, MD.   .I have reviewed the above documentation for accuracy and completeness, and I agree with the above. Brunetta Genera MD

## 2020-12-03 ENCOUNTER — Other Ambulatory Visit: Payer: Self-pay

## 2020-12-03 ENCOUNTER — Inpatient Hospital Stay: Payer: Medicare HMO

## 2020-12-03 ENCOUNTER — Inpatient Hospital Stay: Payer: Medicare HMO | Admitting: Hematology

## 2020-12-03 ENCOUNTER — Inpatient Hospital Stay: Payer: Medicare HMO | Attending: Hematology

## 2020-12-03 VITALS — BP 99/65 | HR 64 | Temp 97.0°F | Resp 18 | Ht 69.0 in | Wt 217.7 lb

## 2020-12-03 DIAGNOSIS — I2510 Atherosclerotic heart disease of native coronary artery without angina pectoris: Secondary | ICD-10-CM | POA: Diagnosis not present

## 2020-12-03 DIAGNOSIS — E7800 Pure hypercholesterolemia, unspecified: Secondary | ICD-10-CM | POA: Insufficient documentation

## 2020-12-03 DIAGNOSIS — D539 Nutritional anemia, unspecified: Secondary | ICD-10-CM | POA: Diagnosis not present

## 2020-12-03 DIAGNOSIS — Z7951 Long term (current) use of inhaled steroids: Secondary | ICD-10-CM | POA: Diagnosis not present

## 2020-12-03 DIAGNOSIS — Z8249 Family history of ischemic heart disease and other diseases of the circulatory system: Secondary | ICD-10-CM | POA: Insufficient documentation

## 2020-12-03 DIAGNOSIS — E538 Deficiency of other specified B group vitamins: Secondary | ICD-10-CM | POA: Diagnosis not present

## 2020-12-03 DIAGNOSIS — G4730 Sleep apnea, unspecified: Secondary | ICD-10-CM | POA: Diagnosis not present

## 2020-12-03 DIAGNOSIS — Z79899 Other long term (current) drug therapy: Secondary | ICD-10-CM | POA: Insufficient documentation

## 2020-12-03 DIAGNOSIS — C7951 Secondary malignant neoplasm of bone: Secondary | ICD-10-CM

## 2020-12-03 DIAGNOSIS — N400 Enlarged prostate without lower urinary tract symptoms: Secondary | ICD-10-CM | POA: Insufficient documentation

## 2020-12-03 DIAGNOSIS — C9000 Multiple myeloma not having achieved remission: Secondary | ICD-10-CM

## 2020-12-03 DIAGNOSIS — Z7901 Long term (current) use of anticoagulants: Secondary | ICD-10-CM | POA: Insufficient documentation

## 2020-12-03 DIAGNOSIS — D6959 Other secondary thrombocytopenia: Secondary | ICD-10-CM | POA: Diagnosis not present

## 2020-12-03 DIAGNOSIS — I4891 Unspecified atrial fibrillation: Secondary | ICD-10-CM | POA: Insufficient documentation

## 2020-12-03 DIAGNOSIS — E785 Hyperlipidemia, unspecified: Secondary | ICD-10-CM | POA: Insufficient documentation

## 2020-12-03 DIAGNOSIS — I119 Hypertensive heart disease without heart failure: Secondary | ICD-10-CM | POA: Diagnosis not present

## 2020-12-03 DIAGNOSIS — M8580 Other specified disorders of bone density and structure, unspecified site: Secondary | ICD-10-CM | POA: Insufficient documentation

## 2020-12-03 DIAGNOSIS — D696 Thrombocytopenia, unspecified: Secondary | ICD-10-CM

## 2020-12-03 DIAGNOSIS — Z7189 Other specified counseling: Secondary | ICD-10-CM

## 2020-12-03 LAB — CBC WITH DIFFERENTIAL/PLATELET
Abs Immature Granulocytes: 0.01 10*3/uL (ref 0.00–0.07)
Basophils Absolute: 0 10*3/uL (ref 0.0–0.1)
Basophils Relative: 0 %
Eosinophils Absolute: 0.1 10*3/uL (ref 0.0–0.5)
Eosinophils Relative: 2 %
HCT: 39.1 % (ref 39.0–52.0)
Hemoglobin: 11.6 g/dL — ABNORMAL LOW (ref 13.0–17.0)
Immature Granulocytes: 0 %
Lymphocytes Relative: 11 %
Lymphs Abs: 0.5 10*3/uL — ABNORMAL LOW (ref 0.7–4.0)
MCH: 27.5 pg (ref 26.0–34.0)
MCHC: 29.7 g/dL — ABNORMAL LOW (ref 30.0–36.0)
MCV: 92.7 fL (ref 80.0–100.0)
Monocytes Absolute: 0.5 10*3/uL (ref 0.1–1.0)
Monocytes Relative: 11 %
Neutro Abs: 3.7 10*3/uL (ref 1.7–7.7)
Neutrophils Relative %: 76 %
Platelets: 106 10*3/uL — ABNORMAL LOW (ref 150–400)
RBC: 4.22 MIL/uL (ref 4.22–5.81)
RDW: 17.5 % — ABNORMAL HIGH (ref 11.5–15.5)
WBC: 4.9 10*3/uL (ref 4.0–10.5)
nRBC: 0 % (ref 0.0–0.2)

## 2020-12-03 LAB — CMP (CANCER CENTER ONLY)
ALT: 12 U/L (ref 0–44)
AST: 18 U/L (ref 15–41)
Albumin: 3.8 g/dL (ref 3.5–5.0)
Alkaline Phosphatase: 85 U/L (ref 38–126)
Anion gap: 10 (ref 5–15)
BUN: 31 mg/dL — ABNORMAL HIGH (ref 8–23)
CO2: 25 mmol/L (ref 22–32)
Calcium: 9.4 mg/dL (ref 8.9–10.3)
Chloride: 106 mmol/L (ref 98–111)
Creatinine: 1.93 mg/dL — ABNORMAL HIGH (ref 0.61–1.24)
GFR, Estimated: 36 mL/min — ABNORMAL LOW (ref >60)
Glucose, Bld: 87 mg/dL (ref 70–99)
Potassium: 4 mmol/L (ref 3.5–5.1)
Sodium: 141 mmol/L (ref 135–145)
Total Bilirubin: 1.1 mg/dL (ref 0.3–1.2)
Total Protein: 7.6 g/dL (ref 6.5–8.1)

## 2020-12-03 MED ORDER — SODIUM CHLORIDE 0.9 % IV SOLN
Freq: Once | INTRAVENOUS | Status: AC
  Filled 2020-12-03: qty 250

## 2020-12-03 MED ORDER — SODIUM CHLORIDE 0.9 % IV SOLN
60.0000 mg | Freq: Once | INTRAVENOUS | Status: AC
  Administered 2020-12-03: 60 mg via INTRAVENOUS
  Filled 2020-12-03: qty 10

## 2020-12-03 NOTE — Patient Instructions (Signed)
Pamidronate Injection What is this medicine? PAMIDRONATE (pa mi DROE nate) slows calcium loss from bones. It treats Paget's disease and high calcium levels in the blood from some kinds of cancer. It may be used in other people at risk for bone loss. This medicine may be used for other purposes; ask your health care provider or pharmacist if you have questions. COMMON BRAND NAME(S): Aredia What should I tell my health care provider before I take this medicine? They need to know if you have any of these conditions:  bleeding disorder  cancer  dental disease  kidney disease  low levels of calcium or other minerals in the blood  low red blood cell counts  receiving steroids like dexamethasone or prednisone  an unusual or allergic reaction to pamidronate, other drugs, foods, dyes or preservatives  pregnant or trying to get pregnant  breast-feeding How should I use this medicine? This drug is injected into a vein. It is given by a health care provider in a hospital or clinic setting. Talk to your health care provider about the use of this drug in children. Special care may be needed. Overdosage: If you think you have taken too much of this medicine contact a poison control center or emergency room at once. NOTE: This medicine is only for you. Do not share this medicine with others. What if I miss a dose? Keep appointments for follow-up doses. It is important not to miss your dose. Call your health care provider if you are unable to keep an appointment. What may interact with this medicine?  certain antibiotics given by injection  medicines for inflammation or pain like ibuprofen, naproxen  some diuretics like bumetanide, furosemide  cyclosporine  parathyroid hormone  tacrolimus  teriparatide  thalidomide This list may not describe all possible interactions. Give your health care provider a list of all the medicines, herbs, non-prescription drugs, or dietary supplements  you use. Also tell them if you smoke, drink alcohol, or use illegal drugs. Some items may interact with your medicine. What should I watch for while using this medicine? Visit your health care provider for regular checks on your progress. It may be some time before you see the benefit from this drug. Some people who take this drug have severe bone, joint, or muscle pain. This drug may also increase your risk for jaw problems or a broken thigh bone. Tell your health care provider right away if you have severe pain in your jaw, bones, joints, or muscles. Tell you health care provider if you have any pain that does not go away or that gets worse. Tell your dentist and dental surgeon that you are taking this drug. You should not have major dental surgery while on this drug. See your dentist to have a dental exam and fix any dental problems before starting this drug. Take good care of your teeth while on this drug. Make sure you see your dentist for regular follow-up appointments. You should make sure you get enough calcium and vitamin D while you are taking this drug. Discuss the foods you eat and the vitamins you take with your health care provider. You may need blood work done while you are taking this drug. Do not become pregnant while taking this drug. Women should inform their health care provider if they wish to become pregnant or think they might be pregnant. There is potential for serious harm to an unborn child. Talk to your health care provider for more information. What side effects   may I notice from receiving this medicine? Side effects that you should report to your doctor or health care provider as soon as possible:  allergic reactions (skin rash, itching or hives; swelling of the face, lips, or tongue)  bleeding (bloody or black, tarry stools; red or dark brown urine; spitting up blood or brown material that looks like coffee grounds; red spots on the skin; unusual bruising or bleeding from  the eyes, gums, or nose)  bone pain  increased thirst  infection (fever, chills, cough, sore throat, pain or trouble passing urine)  jaw pain, especially after dental work  joint pain  kidney injury (trouble passing urine or change in the amount of urine)  low calcium levels (fast heartbeat; muscle cramps or pain; pain, tingling, or numbness in the hands or feet; seizures)  low magnesium levels (fast, irregular heartbeat; muscle cramp or pain; muscle weakness; tremors; seizures)  low potassium levels (trouble breathing; chest pain; dizziness; fast, irregular heartbeat; feeling faint or lightheaded, falls; muscle cramps or pain)  muscle pain  pain, redness, or irritation at site where injected  redness, blistering, peeling, or loosening of the skin, including inside the mouth  severe diarrhea  unusual sweating Side effects that usually do not require medical attention (report to your doctor or health care provider if they continue or are bothersome):  constipation  eye irritation, itching, or pain  fever  headache  increase in blood pressure  loss of appetite  nausea  stomach pain  unusually weak or tired  vomiting This list may not describe all possible side effects. Call your doctor for medical advice about side effects. You may report side effects to FDA at 1-800-FDA-1088. Where should I keep my medicine? This drug is given in a hospital or clinic. It will not be stored at home. NOTE: This sheet is a summary. It may not cover all possible information. If you have questions about this medicine, talk to your doctor, pharmacist, or health care provider.  2021 Elsevier/Gold Standard (2019-08-29 12:19:52)  

## 2020-12-04 LAB — KAPPA/LAMBDA LIGHT CHAINS
Kappa free light chain: 98 mg/L — ABNORMAL HIGH (ref 3.3–19.4)
Kappa, lambda light chain ratio: 4.43 — ABNORMAL HIGH (ref 0.26–1.65)
Lambda free light chains: 22.1 mg/L (ref 5.7–26.3)

## 2020-12-10 ENCOUNTER — Other Ambulatory Visit: Payer: Self-pay | Admitting: Hematology

## 2020-12-10 DIAGNOSIS — C9000 Multiple myeloma not having achieved remission: Secondary | ICD-10-CM

## 2020-12-10 NOTE — Progress Notes (Incomplete)
Histology and Location of Primary Cancer: Multiple Myeloma  Sites of Visceral and Bony Metastatic Disease: Left Iliac wing  PET 11/19/2020: Intensely hypermetabolic lytic lesion in the left iliac wing, consistent with the given history of multiple myeloma. 2. Mildly hypermetabolic old bilateral rib fractures, some of which are slightly expansile in appearance and may have been pathologic in etiology.  CT Pelvis 11/15/2020: 3 mm stone in the right UVJ.  5x6 mm stone is identified in the left UVJ.  No associated distal hydroureter.  Stable lucent lesion in the right iliac bone.  Small right groin hernia contains fat only.  CT L Spine 11/15/2020: No acute fracture or malalignment.  Severe osteopenia and multiple old compression fractures, severe at T11 and T12.  Severe canal stenosis L3-4.  Multilevel neural foraminal narrowing.  Moderate to severe at T12-L1.  Slowly enlarging renal mass.  Past/Anticipated chemotherapy by medical oncology, if any:  Dr. Irene Limbo 12/03/2020 -Discussed pt last light chains in December 2021; stable.  -Discussed pt NM PET Whole Body ([id number]) on 11/19/2020; new bone lesion in the left iliac. -Advised pt e can send referral for radiating spot on left iliac/back pain. The pt wishes to consider this option. -Recommended pt f/u with eye doctor ASAP regarding nodule and blurriness in right eye. -The pt has no prohibitive toxicities from continuing Ninlaro at this time. -Will restart Aredia today.   Pain on a scale of 0-10 is: Lower back and left hip   Ambulatory status? Walker? Wheelchair?:   SAFETY ISSUES:  Prior radiation?  Pacemaker/ICD?   Possible current pregnancy? n/a  Is the patient on methotrexate?   Current Complaints / other details:

## 2020-12-11 ENCOUNTER — Ambulatory Visit: Payer: Medicare HMO

## 2020-12-11 ENCOUNTER — Ambulatory Visit: Admission: RE | Admit: 2020-12-11 | Payer: Medicare HMO | Source: Ambulatory Visit | Admitting: Radiation Oncology

## 2020-12-12 NOTE — Progress Notes (Signed)
Histology and Location of Primary Cancer: Multiple Myeloma  Sites of Visceral and Bony Metastatic Disease: Left Iliac wing  PET 11/19/2020: Intensely hypermetabolic lytic lesion in the left iliac wing, consistent with the given history of multiple myeloma. 2. Mildly hypermetabolic old bilateral rib fractures, some of which are slightly expansile in appearance and may have been pathologic in etiology.  CT Pelvis 11/15/2020: 3 mm stone in the right UVJ.  5x6 mm stone is identified in the left UVJ.  No associated distal hydroureter.  Stable lucent lesion in the right iliac bone.  Small right groin hernia contains fat only.  CT L Spine 11/15/2020: No acute fracture or malalignment.  Severe osteopenia and multiple old compression fractures, severe at T11 and T12.  Severe canal stenosis L3-4.  Multilevel neural foraminal narrowing.  Moderate to severe at T12-L1.  Slowly enlarging renal mass.  Past/Anticipated chemotherapy by medical oncology, if any:  Dr. Irene Limbo 12/03/2020 -Discussed pt last light chains in December 2021; stable.  -Discussed pt NM PET Whole Body ([id number]) on 11/19/2020; new bone lesion in the left iliac. -Advised pt we can send referral for radiating spot on left iliac/back pain. The pt wishes to consider this option. -Recommended pt f/u with eye doctor ASAP regarding nodule and blurriness in right eye. -The pt has no prohibitive toxicities from continuing Ninlaro at this time. -Will restart Aredia today.   Pain on a scale of 0-10 is: Lower back pains.  No pains noted in left hip, states bilateral legs feel numb.   Ambulatory status? Walker? Wheelchair?: Ambulatory with walker and cane  SAFETY ISSUES:  Prior radiation? No  Pacemaker/ICD? No  Possible current pregnancy? n/a  Is the patient on methotrexate? No  Current Complaints / other details:

## 2020-12-13 ENCOUNTER — Ambulatory Visit
Admission: RE | Admit: 2020-12-13 | Discharge: 2020-12-13 | Disposition: A | Discharge status: Home / Self Care | Payer: Medicare HMO | Source: Ambulatory Visit | Attending: Radiation Oncology | Admitting: Radiation Oncology

## 2020-12-13 ENCOUNTER — Encounter: Payer: Self-pay | Admitting: Radiation Oncology

## 2020-12-13 ENCOUNTER — Other Ambulatory Visit: Payer: Self-pay

## 2020-12-13 VITALS — Ht 69.0 in | Wt 217.0 lb

## 2020-12-13 DIAGNOSIS — C7951 Secondary malignant neoplasm of bone: Secondary | ICD-10-CM

## 2020-12-13 DIAGNOSIS — C9000 Multiple myeloma not having achieved remission: Secondary | ICD-10-CM

## 2020-12-13 NOTE — Progress Notes (Signed)
Radiation Oncology         (336) [phone] ________________________________  Initial Outpatient Consultation - Conducted via telephone due to current COVID-19 concerns for limiting patient exposure  I spoke with the patient to conduct this consult visit via telephone to spare the patient unnecessary potential exposure in the healthcare setting during the current COVID-19 pandemic. The patient was notified in advance and was offered a [place] meeting to allow for face to face communication but unfortunately reported that they did not have the appropriate resources/technology to support such a visit and instead preferred to proceed with a telephone consult.    Name: Richard Vang        MRN: [id number]  Date of Service: 12/13/2020 DOB: 02-18-1946  CC:Sun, Mikeal Hawthorne, MD  Brunetta Genera, MD     REFERRING PHYSICIAN: Brunetta Genera, MD   DIAGNOSIS: The primary encounter diagnosis was Secondary malignant neoplasm of bone [facility]). A diagnosis of Multiple myeloma without remission (HCC) was also pertinent to this visit.   HISTORY OF PRESENT ILLNESS: Richard Vang is a 75 y.o. male seen at the request of Dr. Irene Limbo for a history of multiple myeloma originally diagnosed in 2017.  He has had previous systemic treatments but has also had some difficulty in tolerating them, but has remained on Ninlaro and has been followed with stable light chains since.  A restaging whole-body PET scan on 11/19/2020 however did show a new lesion in the left  iliac wing  measuring 2.2 cm  with an SUV of 28.4, a second right iliac wing lesion measuring 3.9 cm was unchanged from 2020 without hypermetabolism. The paitent has been experiencing pain in this left hip/low back location, and for this reason is seen to discuss options of palliative radiotherapy.      PREVIOUS RADIATION THERAPY: No   PAST MEDICAL HISTORY:  Past Medical History:  Diagnosis Date   Arthritis    "left shoulder" (07/31/2016)   Coronary artery  disease    GERD (gastroesophageal reflux disease)    Heart murmur    High cholesterol    Hypertension    Multiple myeloma ([place])    Sleep apnea    "probably; having test in November" (07/31/2016)   Type II diabetes mellitus ([place])        PAST SURGICAL HISTORY: Past Surgical History:  Procedure Laterality Date   CARDIAC CATHETERIZATION N/A 07/31/2016   Procedure: Left Heart Cath and Coronary Angiography;  Surgeon: Lorretta Harp, MD;  Location: [place] [facility];  Service: Cardiovascular;  Laterality: N/A;   CARDIAC CATHETERIZATION N/A 07/31/2016   Procedure: Coronary Stent Intervention;  Surgeon: Lorretta Harp, MD;  Location: [place] [facility];  Service: Cardiovascular;  Laterality: N/A;   CORONARY ANGIOPLASTY     OPEN REDUCTION INTERNAL FIXATION (ORIF) METACARPAL Left 05/21/2018   Procedure: OPEN REDUCTION INTERNAL FIXATION (ORIF) 2ND METACARPAL;  Surgeon: Shona Needles, MD;  Location: [place];  Service: Orthopedics;  Laterality: Left;   ORIF CLAVICULAR FRACTURE Right 05/21/2018   Procedure: OPEN REDUCTION INTERNAL FIXATION (ORIF) CLAVICULAR FRACTURE;  Surgeon: Shona Needles, MD;  Location: [facility];  Service: Orthopedics;  Laterality: Right;   SHOULDER SURGERY Left 1973   "put pin in it where it had separated"    TONSILLECTOMY  ~ 1956     FAMILY HISTORY:  Family History  Problem Relation Age of Onset   Hypertension Other    Lung cancer Mother      SOCIAL HISTORY:  reports  that he has never smoked. He has never used smokeless tobacco. He reports previous alcohol use. He reports that he does not use drugs. The patient is married and lives in [place].    ALLERGIES: Patient has no known allergies.   MEDICATIONS:  Current Outpatient Medications  Medication Sig Dispense Refill   acyclovir (ZOVIRAX) 400 MG tablet Take 1 tablet by mouth twice daily 60 tablet 1   amLODipine (NORVASC) 5 MG tablet Take 5 mg by mouth daily.     atorvastatin (LIPITOR) 40  MG tablet Take 1 tablet (40 mg total) by mouth at bedtime. 30 tablet 12   carvedilol (COREG) 12.5 MG tablet Take 12.5 mg by mouth 2 (two) times daily with a meal.     clopidogrel (PLAVIX) 75 MG tablet Take 1 tablet (75 mg total) by mouth daily with breakfast. 30 tablet 12   cyclobenzaprine (FLEXERIL) 10 MG tablet Take 10 mg by mouth daily as needed for muscle spasms.   2   escitalopram (LEXAPRO) 10 MG tablet Take 10 mg by mouth daily.     furosemide (LASIX) 80 MG tablet Take 1 tablet (80 mg total) by mouth daily. Take 1 Tablet on Monday,Wendsday, and Friday 90 tablet 3   isosorbide mononitrate (IMDUR) 30 MG 24 hr tablet Take 1 tablet (30 mg total) by mouth daily. 90 tablet 3   montelukast (SINGULAIR) 10 MG tablet Take 10 mg by mouth daily.     NINLARO 3 MG capsule TAKE 1 CAPSULE (3 MG TOTAL) BY MOUTH ONCE A WEEK. ON DAY 1,8,15 EVERY 28 DAYS. TAKE ON AN EMPTY STOMACH 1HR BEFORE OR 2HRS AFTER FOOD. 3 capsule 1   omeprazole (PRILOSEC) 40 MG capsule Take 40 mg by mouth daily with breakfast.      oxyCODONE-acetaminophen (PERCOCET) 10-325 MG tablet Take 1 tablet by mouth 2 (two) times daily. 10 tablet 0   Potassium Chloride CR (MICRO-K) 8 MEQ CPCR capsule CR Take 8 mEq by mouth daily.     SYMBICORT 160-4.5 MCG/ACT inhaler Inhale 2 puffs into the lungs 2 (two) times daily as needed (shortness of breath).      Vitamin D, Ergocalciferol, (DRISDOL) 1.25 MG (50000 UNIT) CAPS capsule Take 1 capsule by mouth once a week 12 capsule 0   warfarin (COUMADIN) 5 MG tablet Take 5 mg by mouth daily.     No current facility-administered medications for this encounter.   Facility-Administered Medications Ordered in Other Encounters  Medication Dose Route Frequency Provider Last Rate Last Admin   0.9 %  sodium chloride infusion   Intravenous Continuous Brunetta Genera, MD 10 mL/hr at 07/09/17 1423 New Bag at 07/09/17 1423     REVIEW OF SYSTEMS: On review of systems, the patient reports that h has  had peripheral neuropathy per other providers for many years but states he's not sure why since he doesn't have severe diabetes and reports he doesn't recall a time he hasn't had a normal blood sugar. He reports however that he is having pain in the low back, left greater than right that can shoot into the back of his lower legs. He denies difficulty with walking as a result of loss of control but for a long time has had trouble with initiating steps from his neuropathy. He denies loss of control of bowel or bladder activity, and denies numbness or tingling or weakness elsewhere including the saddle. No other complaints are noted.     PHYSICAL EXAM:  Wt Readings from Last 3 Encounters:  12/13/20 217 lb (98.4 kg)  12/03/20 217 lb 11.2 oz (98.7 kg)  11/08/20 224 lb 12.8 oz (102 kg)  unable to assess due to encounter type.  ECOG = 1  0 - Asymptomatic (Fully active, able to carry on all predisease activities without restriction)  1 - Symptomatic but completely ambulatory (Restricted in physically strenuous activity but ambulatory and able to carry out work of a light or sedentary nature. For example, light housework, office work)  2 - Symptomatic, <50% in bed during the day (Ambulatory and capable of all self care but unable to carry out any work activities. Up and about more than 50% of waking hours)  3 - Symptomatic, >50% in bed, but not bedbound (Capable of only limited self-care, confined to bed or chair 50% or more of waking hours)  4 - Bedbound (Completely disabled. Cannot carry on any self-care. Totally confined to bed or chair)  5 - Death   Eustace Pen MM, Creech RH, Tormey DC, et al. [phone]). "Toxicity and response criteria of the [facility]". [place] Oncol. 5 (6): 649-55    LABORATORY DATA:  Lab Results  Component Value Date   WBC 4.9 12/03/2020   HGB 11.6 (L) 12/03/2020   HCT 39.1 12/03/2020   MCV 92.7 12/03/2020   PLT 106 (L) 12/03/2020   Lab Results   Component Value Date   NA 141 12/03/2020   K 4.0 12/03/2020   CL 106 12/03/2020   CO2 25 12/03/2020   Lab Results  Component Value Date   ALT 12 12/03/2020   AST 18 12/03/2020   ALKPHOS 85 12/03/2020   BILITOT 1.1 12/03/2020      RADIOGRAPHY: NM PET Image Restage (PS) Whole Body  Result Date: 11/19/2020 CLINICAL DATA:  Initial treatment strategy for multiple myeloma. EXAM: NUCLEAR MEDICINE PET WHOLE BODY TECHNIQUE: 11.2 mCi F-18 FDG was injected intravenously. Full-ring PET imaging was performed from the head to foot after the radiotracer. CT data was obtained and used for attenuation correction and anatomic localization. Fasting blood glucose: 103 mg/dl COMPARISON:  CT lumbar spine and pelvis 11/15/2018, CT chest abdomen pelvis 05/19/2018. FINDINGS: Mediastinal blood pool activity: SUV max 3.4 HEAD/NECK: No abnormal hypermetabolism. Incidental CT findings: Mucosal thickening in the right ethmoid air cells. CHEST: No hypermetabolic mediastinal, hilar or axillary lymph nodes. No hypermetabolic pulmonary nodules. Incidental CT findings: Atherosclerotic calcification of the aorta, aortic valve and coronary arteries. Heart is enlarged. No pericardial or pleural effusion. Given image degradation by respiratory motion, lungs are grossly clear. ABDOMEN/PELVIS: No abnormal hypermetabolism in the liver, adrenal glands, spleen or pancreas. No hypermetabolic lymph nodes. Incidental CT findings: Liver margin is slightly irregular. Stones are seen in the gallbladder. Adrenal glands are unremarkable. Stones are seen in the kidneys bilaterally. A 1.5 cm low-attenuation lesion in the interpolar left kidney and 2.4 cm hyperdense lesion off the lower pole left kidney are difficult to further characterize without post-contrast imaging. Visualized portions of the spleen, pancreas, stomach and bowel are unremarkable. Duodenal diverticula are incidentally noted. Coarse calcification in the small bowel mesentery  (4/163), unchanged from 05/19/2018. Atherosclerotic calcification of the aorta. Prostate is enlarged. A 9 mm calcification is seen in the right posterolateral bladder. SKELETON: Intense hypermetabolism is seen within a lytic lesion in the inferomedial left iliac wing, measuring 2.2 cm, SUV max 28.4. Lesion is new from 11/15/2018. 3.9 cm lucent lesion in the right iliac wing is well-circumscribed, unchanged from 11/15/2018 and shows no hypermetabolism. Multiple mildly hypermetabolic old  bilateral anterolateral rib fractures, some of which have an expansile appearance and may have been pathologic in etiology. Mild degenerative uptake in the spine. Incidental CT findings: Degenerative changes in the spine. Old right clavicle fracture repair. A screw is seen in the proximal left humerus. EXTREMITIES: No abnormal hypermetabolism. Incidental CT findings: None. IMPRESSION: 1. Intensely hypermetabolic lytic lesion in the left iliac wing, consistent with the given history of multiple myeloma. 2. Mildly hypermetabolic old bilateral rib fractures, some of which are slightly expansile in appearance and may have been pathologic in etiology. 3. Liver appears cirrhotic. 4. Cholelithiasis. 5. Bilateral nephrolithiasis.  Bladder stone. 6. Enlarged prostate. 7. Aortic atherosclerosis (ICD10-I70.0). Coronary artery calcification. Electronically Signed   By: Lorin Picket M.D.   On: 11/19/2020 11:33       IMPRESSION/PLAN: 1. Multiple myeloma not having achieved remission. Dr. Lisbeth Renshaw discusses the pathology findings and reviews the nature of myelomatous disease and the rationale for palliative radiotherapy for painful lesions of the bone.. We discussed the risks, benefits, short, and long term effects of radiotherapy, as well as the curative intent, and the patient is interested in proceeding. Dr. Lisbeth Renshaw discusses the delivery and logistics of radiotherapy and anticipates a course of 2 weeks of radiotherapy to the left iliac  region.  We will simulate on Friday next week at which time he will sign written consent to proceed.     Given current concerns for patient exposure during the COVID-19 pandemic, this encounter was conducted via telephone.  The patient has provided two factor identification and has given verbal consent for this type of encounter and has been advised to only accept a meeting of this type in a secure network environment. The time spent during this encounter was 46mnutes including preparation, discussion, and coordination of the patient's care. The attendants for this meeting include LBlenda Nicely RN, Dr. MLisbeth Renshaw AHayden Pedro and RJuliette Alcideand his wife Richard Vang.  During the encounter,  LBlenda Nicely RN, Dr. MLisbeth Renshaw and AHayden Pedrowere located at [facility].  Richard TADDEIwas located at home with his wife Richard Vang   The above documentation reflects my direct findings during this shared patient visit. Please see the separate note by Dr. MLisbeth Renshawon this date for the remainder of the patient's plan of care.    ACarola Rhine PParam[facility]  **Disclaimer: This note was dictated with voice recognition software. Similar sounding words can inadvertently be transcribed and this note may contain transcription errors which may not have been corrected upon publication of note.**

## 2020-12-14 ENCOUNTER — Encounter: Payer: Self-pay | Admitting: General Practice

## 2020-12-14 NOTE — Progress Notes (Addendum)
[place] Psychosocial Distress Screening Clinical Social Work  Clinical Social Work was referred by distress screening protocol.  The patient scored a 6 on the Psychosocial Distress Thermometer which indicates moderate distress. Clinical Social Worker contacted patient by phone to assess for distress and other psychosocial needs, spoke w significant other who is his caregiver. "This has totally upside down his life."  Pain, fatigue, difficulty walking have limited his previously active lifestyle.  Hopes radiation tx will help w pain.  Only leaves the house in order to go to doctor, uses walker or cane all the time.  Now hunched over.  Prior to diagnosis, he was retired but worked part time.  Pandemic has restricted activity. CSW and caregiver discussed common feeling and emotions when being diagnosed with cancer, and the importance of support during treatment. CSW informed patient of the support team and support services at [facility]. CSW provided contact information and encouraged patient to call with any questions or concerns.  ONCBCN DISTRESS SCREENING 12/13/2020  Screening Type Initial Screening  Distress experienced in past week (1-10) 6  Emotional problem type Adjusting to appearance changes  Information Concerns Type Lack of info about treatment  Physical Problem type Pain    Clinical Social Worker follow up needed: No.  If yes, follow up plan:  Beverely Pace, [place], LCSW Clinical Social Worker Phone:  [phone]

## 2020-12-17 ENCOUNTER — Other Ambulatory Visit: Payer: Self-pay | Admitting: Hematology

## 2020-12-17 DIAGNOSIS — C9000 Multiple myeloma not having achieved remission: Secondary | ICD-10-CM

## 2020-12-20 MED FILL — NINLARO 3 MG CAPS: 3 | 28 days supply | Qty: 3 | Fill #0

## 2020-12-21 ENCOUNTER — Other Ambulatory Visit: Payer: Self-pay

## 2020-12-21 ENCOUNTER — Ambulatory Visit: Payer: Medicare HMO | Admitting: Cardiovascular Disease

## 2020-12-21 ENCOUNTER — Encounter: Payer: Self-pay | Admitting: Cardiovascular Disease

## 2020-12-21 ENCOUNTER — Ambulatory Visit
Admission: RE | Admit: 2020-12-21 | Discharge: 2020-12-21 | Disposition: A | Discharge status: Home / Self Care | Payer: Medicare HMO | Source: Ambulatory Visit | Attending: Radiation Oncology | Admitting: Radiation Oncology

## 2020-12-21 DIAGNOSIS — C9000 Multiple myeloma not having achieved remission: Secondary | ICD-10-CM | POA: Insufficient documentation

## 2020-12-21 DIAGNOSIS — E785 Hyperlipidemia, unspecified: Secondary | ICD-10-CM

## 2020-12-21 DIAGNOSIS — Z510 Encounter for antineoplastic radiation therapy: Secondary | ICD-10-CM | POA: Diagnosis present

## 2020-12-21 DIAGNOSIS — I482 Chronic atrial fibrillation, unspecified: Secondary | ICD-10-CM

## 2020-12-21 DIAGNOSIS — C7951 Secondary malignant neoplasm of bone: Secondary | ICD-10-CM | POA: Insufficient documentation

## 2020-12-21 DIAGNOSIS — I10 Essential (primary) hypertension: Secondary | ICD-10-CM

## 2020-12-21 DIAGNOSIS — R0600 Dyspnea, unspecified: Secondary | ICD-10-CM

## 2020-12-21 DIAGNOSIS — I712 Thoracic aortic aneurysm, without rupture, unspecified: Secondary | ICD-10-CM

## 2020-12-21 DIAGNOSIS — I2510 Atherosclerotic heart disease of native coronary artery without angina pectoris: Secondary | ICD-10-CM

## 2020-12-21 DIAGNOSIS — R0609 Other forms of dyspnea: Secondary | ICD-10-CM

## 2020-12-21 DIAGNOSIS — Z9861 Coronary angioplasty status: Secondary | ICD-10-CM

## 2020-12-21 NOTE — Assessment & Plan Note (Signed)
History of CAD status post proximal LAD PCI and drug-eluting stenting by myself 07/31/2016 after being referred to me by Dr. Elenora Fender.  He did have a Myoview stress test prior to his calf that showed ischemia in the end here wall.  He felt clinically improved after that.

## 2020-12-21 NOTE — Assessment & Plan Note (Signed)
History of chronic A. fib rate controlled on warfarin oral anticoagulation.  He may be a better candidate for DOAC.

## 2020-12-21 NOTE — Patient Instructions (Addendum)
Medication Instructions:  Your physician recommends that you continue on your current medications as directed. Please refer to the Current Medication list given to you today.  *If you need a refill on your cardiac medications before your next appointment, please call your pharmacy*   Testing/Procedures: Your physician has requested that you have an echocardiogram. Echocardiography is a painless test that uses sound waves to create images of your heart. It provides your doctor with information about the size and shape of your heart and how well your heart's chambers and valves are working. This procedure takes approximately one hour. There are no restrictions for this procedure. This procedure is done at [address]rd [address]. To be done Jan of 2023    Follow-Up: At [facility], you and your health needs are our priority.  As part of our continuing mission to provide you with exceptional heart care, we have created designated Provider Care Teams.  These Care Teams include your primary Cardiologist (physician) and Advanced Practice Providers (APPs -  Physician Assistants and Nurse Practitioners) who all work together to provide you with the care you need, when you need it.  We recommend signing up for the patient portal called "MyChart".  Sign up information is provided on this After Visit Summary.  MyChart is used to connect with patients for Virtual Visits (Telemedicine).  Patients are able to view lab/test results, encounter notes, upcoming appointments, etc.  Non-urgent messages can be sent to your provider as well.   To learn more about what you can do with MyChart, go to [email or web address].    Your next appointment:   6 month(s)  The format for your next appointment:   In Person  Provider:   You will see one of the following Advanced Practice Providers on your designated Care Team:    Sande Rives, PA-C  Coletta Memos, FNP  Then, Quay Burow, MD will plan to see  you again in 12 month(s).

## 2020-12-21 NOTE — Assessment & Plan Note (Signed)
History of dyslipidemia on statin therapy with lipid profile performed 08/23/2019 revealing total cholesterol 100, LDL 37 and HDL 46. °

## 2020-12-21 NOTE — Assessment & Plan Note (Signed)
Recent 2D echo performed 10/24/2020 showed a ascending thoracic aortic dimension of 44 mm.  This will be repeated in 1 year.

## 2020-12-21 NOTE — Progress Notes (Signed)
12/21/2020 Richard Vang   10/18/1946  [id number]  Primary Physician Richard Mariscal, MD Primary Cardiologist: Richard Harp MD Richard Vang, [place]  HPI:  Richard Vang is a 76 y.o.  moderate to severely overweight married Caucasian male father of 2 living children (1 deceased), grandfather of 5 grandchildren referred by Dr. Claudie Vang initially for cardiac catheterization. He is retired working part-time at [employer] .  I last saw him in the office 08/23/2019.  He is accompanied by his wife Richard Vang today. His risk factors include treated hypertension, diabetes and hyperlipidemia. His father did die of a myocardial infarction at age 48. He has never had a heart attack or stroke.  He was having increasing dyspnea on exertion for the last 4-6 week prior to seeing me.  A Myoview stress test showed LV dysfunction with an EF of 40% and mild anterior ischemia.  I performed cardiac catheterization on him 07/31/2016 revealing a high-grade proximal LAD stenosis which had stented using a drug-eluting stent.  His symptoms ultimately improved although I never saw him back after that.  He also has chronic atrial fibrillation on Coumadin anticoagulation.  He was admitted in August 3557 with diastolic heart failure was diuresed.  He is now on daily Lasix and is watching his salt intake.    He saw Richard Vang in the office 11/08/2020 after he had a 2D echo that revealed preserved LV function and a Myoview stress test that showed no ischemia.  His Lasix was uptitrated and he was clinically improved.  Since I saw him a year and a half ago he continues to deny chest pain.  I believe his shortness of breath is related to diastolic dysfunction.  He does have a history of cardiomyopathy with an EF that was originally in the 40% range however most recently his 2D echo performed 10/24/2020 showed normal LV systolic function.  Current Meds  Medication Sig  . acyclovir (ZOVIRAX) 400 MG tablet Take 1 tablet by mouth twice  daily  . amLODipine (NORVASC) 5 MG tablet Take 5 mg by mouth daily.  [name] atorvastatin (LIPITOR) 40 MG tablet Take 1 tablet (40 mg total) by mouth at bedtime.  . carvedilol (COREG) 12.5 MG tablet Take 12.5 mg by mouth 2 (two) times daily with a meal.  . clopidogrel (PLAVIX) 75 MG tablet Take 1 tablet (75 mg total) by mouth daily with breakfast.  . cyclobenzaprine (FLEXERIL) 10 MG tablet Take 10 mg by mouth daily as needed for muscle spasms.   [name] escitalopram (LEXAPRO) 10 MG tablet Take 10 mg by mouth daily.  . furosemide (LASIX) 80 MG tablet Take 80 mg by mouth daily.  . isosorbide mononitrate (IMDUR) 30 MG 24 hr tablet Take 1 tablet (30 mg total) by mouth daily.  . montelukast (SINGULAIR) 10 MG tablet Take 10 mg by mouth daily.  [name] NINLARO 3 MG capsule TAKE 1 CAPSULE (3 MG TOTAL) BY MOUTH ONCE A WEEK. ON DAY 1,8,15 EVERY 28 DAYS. TAKE ON AN EMPTY STOMACH 1HR BEFORE OR 2HRS AFTER FOOD.  [name] omeprazole (PRILOSEC) 40 MG capsule Take 40 mg by mouth daily with breakfast.   . oxyCODONE-acetaminophen (PERCOCET) 10-325 MG tablet Take 1 tablet by mouth 2 (two) times daily.  . Potassium Chloride CR (MICRO-K) 8 MEQ CPCR capsule CR Take 8 mEq by mouth daily.  . SYMBICORT 160-4.5 MCG/ACT inhaler Inhale 2 puffs into the lungs 2 (two) times daily as needed (shortness of breath).   . Vitamin  D, Ergocalciferol, (DRISDOL) 1.25 MG (50000 UNIT) CAPS capsule Take 1 capsule by mouth once a week  . warfarin (COUMADIN) 5 MG tablet Take 5 mg by mouth daily.     No Known Allergies  Social History   Socioeconomic History  . Marital status: Divorced    Spouse name: Not on file  . Number of children: Not on file  . Years of education: Not on file  . Highest education level: Not on file  Occupational History  . Not on file  Tobacco Use  . Smoking status: Never Smoker  . Smokeless tobacco: Never Used  Vaping Use  . Vaping Use: Never used  Substance and Sexual Activity  . Alcohol use: Not Currently    Comment:  07/31/2016 "nothing since 2002"  . Drug use: No  . Sexual activity: Not Currently  Other Topics Concern  . Not on file  Social History Narrative  . Not on file   Social Determinants of Health   Financial Resource Strain: Low Risk   . Difficulty of Paying Living Expenses: Not hard at all  Food Insecurity: No Food Insecurity  . Worried About Charity fundraiser in the Last Year: Never true  . Ran Out of Food in the Last Year: Never true  Transportation Needs: No Transportation Needs  . Lack of Transportation (Medical): No  . Lack of Transportation (Non-Medical): No  Physical Activity: Inactive  . Days of Exercise per Week: 0 days  . Minutes of Exercise per Session: 0 min  Stress: Not on file  Social Connections: Moderately Integrated  . Frequency of Communication with Friends and Family: More than three times a week  . Frequency of Social Gatherings with Friends and Family: More than three times a week  . Attends Religious Services: Never  . Active Member of Clubs or Organizations: Yes  . Attends Archivist Meetings: Never  . Marital Status: Living with partner  Intimate Partner Violence: Not At Risk  . Fear of Current or Ex-Partner: No  . Emotionally Abused: No  . Physically Abused: No  . Sexually Abused: No     Review of Systems: General: negative for chills, fever, night sweats or weight changes.  Cardiovascular: negative for chest pain, dyspnea on exertion, edema, orthopnea, palpitations, paroxysmal nocturnal dyspnea or shortness of breath Dermatological: negative for rash Respiratory: negative for cough or wheezing Urologic: negative for hematuria Abdominal: negative for nausea, vomiting, diarrhea, bright red blood per rectum, melena, or hematemesis Neurologic: negative for visual changes, syncope, or dizziness All other systems reviewed and are otherwise negative except as noted above.    Blood pressure 126/68, pulse 67, height 5\' 9"  (1.753 m), weight 216  lb 12.8 oz (98.3 kg), SpO2 98 %.  General appearance: alert and no distress Neck: no adenopathy, no carotid bruit, no JVD, supple, symmetrical, trachea midline and thyroid not enlarged, symmetric, no tenderness/mass/nodules Lungs: clear to auscultation bilaterally Heart: irregularly irregular rhythm Extremities: extremities normal, atraumatic, no cyanosis or edema Pulses: 2+ and symmetric Skin: Skin color, texture, turgor normal. No rashes or lesions Neurologic: Alert and oriented X 3, normal strength and tone. Normal symmetric reflexes. Normal coordination and gait  EKG A. fib with ventricular sponsor 67 and septal Q waves.  I personally reviewed this EKG.  ASSESSMENT AND PLAN:   Essential hypertension History of essential hypertension a blood pressure measured today 126/68.  He is on amlodipine and carvedilol.  Chronic atrial fibrillation History of chronic A. fib rate controlled on warfarin  oral anticoagulation.  He may be a better candidate for DOAC.  Dyspnea on exertion Dyspnea on exertion, probably multifactorial.  He recently had his furosemide increased which resulted in marked improvement in his breathing.  CAD S/P percutaneous coronary angioplasty History of CAD status post proximal LAD PCI and drug-eluting stenting by myself 07/31/2016 after being referred to me by Dr. Elenora Fender.  He did have a Myoview stress test prior to his calf that showed ischemia in the end here wall.  He felt clinically improved after that.  Dyslipidemia, goal LDL below 70 History of dyslipidemia on statin therapy with lipid profile performed 08/23/2019 revealing total cholesterol 100, LDL 37 and HDL 46.  Thoracic aortic aneurysm ([place]) Recent 2D echo performed 10/24/2020 showed a ascending thoracic aortic dimension of 44 mm.  This will be repeated in 1 year.      Richard Harp MD [place], [facility] 12/21/2020 3:26 PM

## 2020-12-21 NOTE — Progress Notes (Addendum)
Heart and Vascular Care Navigation  12/25/2020  Richard Vang 12/19/1945 [id number]  Reason for Referral:  Extra Help Program                                                                                                     Assessment:               LCSW contacted by pharmacist Erasmo Downer regarding Extra Help program to see if Richard Vang would be eligible.  I met with Richard Vang and his ex-spouse, they raise their grandchildren together and she helps support Richard Vang as needed. LCSW completed assessment for Extra Help and per the screening tool Richard Vang may qualify for copay assistance due to income. We completed the full application and submitted it on Richard Vang behalf. Richard Vang re-entry number at [phone]. Richard Vang and Richard Vang also given my number and card and contact information for [employer]. Richard Vang and Richard Vang ex encouraged to call if any additional questions or concerns. They should receive a letter w/ determination in the mail in the next few weeks. They were given printouts with all information concerning the application.                         HRT/VAS Care Coordination    Patients Home Cardiology Office [place] Team Social Worker   Social Worker Name: Margarito Liner [place], [phone]   Living arrangements for the past 2 months Single Family Home   Lives with: Other (Comment)  ex-Vang   Patient Current Insurance Coverage Managed Medicare   Patient Has Concern With Paying Medical Bills No   Does Patient Have Prescription Coverage? Yes   Patient Prescription Assistance Programs Other   Other Assistance Programs Medications assisted Richard Vang w/ Extra Help program application   Home Assistive Devices/Equipment Cane (specify quad or straight)   DME [place]      Social History:                                                                             SDOH Screenings   Alcohol Screen: Not on file  Depression (PHQ2-9): Not on  file  Financial Resource Strain: Low Risk   . Difficulty of Paying Living Expenses: Not hard at all  Food Insecurity: No Food Insecurity  . Worried About Charity fundraiser in the Last Year: Never true  . Ran Out of Food in the Last Year: Never true  Housing: Low Risk   . Last Housing Risk Score: 0  Physical Activity: Inactive  . Days of Exercise per Week: 0 days  . Minutes of Exercise per Session: 0 min  Social Connections: Moderately Integrated  . Frequency of  Communication with Friends and Family: More than three times a week  . Frequency of Social Gatherings with Friends and Family: More than three times a week  . Attends Religious Services: Never  . Active Member of Clubs or Organizations: Yes  . Attends Archivist Meetings: Never  . Marital Status: Living with partner  Stress: Not on file  Tobacco Use: Low Risk   . Smoking Tobacco Use: Never Smoker  . Smokeless Tobacco Use: Never Used  Transportation Needs: No Transportation Needs  . Lack of Transportation (Medical): No  . Lack of Transportation (Non-Medical): No    SDOH Interventions: Financial Resources:  Financial Strain Interventions: Other (Comment) (assisted w/ Extra Help Application)     Other Care Navigation Interventions:     Provided Pharmacy assistance resources Other- Extra Help application   Follow-up plan:   LCSW will f/u in 1-2 weeks to see if Richard Vang has received any additional information from Extra Help Program. Richard Vang has my card for any additional questions/concerns.

## 2020-12-21 NOTE — Assessment & Plan Note (Signed)
Dyspnea on exertion, probably multifactorial.  He recently had his furosemide increased which resulted in marked improvement in his breathing.

## 2020-12-21 NOTE — Assessment & Plan Note (Signed)
History of essential hypertension a blood pressure measured today 126/68.  He is on amlodipine and carvedilol.

## 2020-12-25 ENCOUNTER — Ambulatory Visit
Admission: RE | Admit: 2020-12-25 | Discharge: 2020-12-25 | Disposition: A | Discharge status: Home / Self Care | Payer: Medicare HMO | Source: Ambulatory Visit | Attending: Radiation Oncology | Admitting: Radiation Oncology

## 2020-12-25 DIAGNOSIS — Z510 Encounter for antineoplastic radiation therapy: Secondary | ICD-10-CM | POA: Diagnosis not present

## 2020-12-26 ENCOUNTER — Other Ambulatory Visit: Payer: Self-pay

## 2020-12-26 ENCOUNTER — Ambulatory Visit
Admission: RE | Admit: 2020-12-26 | Discharge: 2020-12-26 | Disposition: A | Discharge status: Home / Self Care | Payer: Medicare HMO | Source: Ambulatory Visit | Attending: Radiation Oncology | Admitting: Radiation Oncology

## 2020-12-26 DIAGNOSIS — Z510 Encounter for antineoplastic radiation therapy: Secondary | ICD-10-CM | POA: Diagnosis not present

## 2020-12-27 ENCOUNTER — Ambulatory Visit
Admission: RE | Admit: 2020-12-27 | Discharge: 2020-12-27 | Disposition: A | Discharge status: Home / Self Care | Payer: Medicare HMO | Source: Ambulatory Visit | Attending: Radiation Oncology | Admitting: Radiation Oncology

## 2020-12-27 DIAGNOSIS — Z510 Encounter for antineoplastic radiation therapy: Secondary | ICD-10-CM | POA: Diagnosis not present

## 2020-12-28 ENCOUNTER — Ambulatory Visit
Admission: RE | Admit: 2020-12-28 | Discharge: 2020-12-28 | Disposition: A | Discharge status: Home / Self Care | Payer: Medicare HMO | Source: Ambulatory Visit | Attending: Radiation Oncology | Admitting: Radiation Oncology

## 2020-12-28 DIAGNOSIS — Z510 Encounter for antineoplastic radiation therapy: Secondary | ICD-10-CM | POA: Diagnosis not present

## 2020-12-31 ENCOUNTER — Ambulatory Visit
Admission: RE | Admit: 2020-12-31 | Discharge: 2020-12-31 | Disposition: A | Discharge status: Home / Self Care | Payer: Medicare HMO | Source: Ambulatory Visit | Attending: Radiation Oncology | Admitting: Radiation Oncology

## 2020-12-31 ENCOUNTER — Other Ambulatory Visit: Payer: Self-pay

## 2020-12-31 DIAGNOSIS — Z510 Encounter for antineoplastic radiation therapy: Secondary | ICD-10-CM | POA: Diagnosis not present

## 2021-01-01 ENCOUNTER — Ambulatory Visit
Admission: RE | Admit: 2021-01-01 | Discharge: 2021-01-01 | Disposition: A | Discharge status: Home / Self Care | Payer: Medicare HMO | Source: Ambulatory Visit | Attending: Radiation Oncology | Admitting: Radiation Oncology

## 2021-01-01 ENCOUNTER — Other Ambulatory Visit: Payer: Self-pay

## 2021-01-01 DIAGNOSIS — Z510 Encounter for antineoplastic radiation therapy: Secondary | ICD-10-CM | POA: Diagnosis not present

## 2021-01-02 ENCOUNTER — Ambulatory Visit
Admission: RE | Admit: 2021-01-02 | Discharge: 2021-01-02 | Disposition: A | Discharge status: Home / Self Care | Payer: Medicare HMO | Source: Ambulatory Visit | Attending: Radiation Oncology | Admitting: Radiation Oncology

## 2021-01-02 DIAGNOSIS — Z510 Encounter for antineoplastic radiation therapy: Secondary | ICD-10-CM | POA: Diagnosis not present

## 2021-01-03 ENCOUNTER — Other Ambulatory Visit: Payer: Self-pay

## 2021-01-03 ENCOUNTER — Ambulatory Visit
Admission: RE | Admit: 2021-01-03 | Discharge: 2021-01-03 | Disposition: A | Discharge status: Home / Self Care | Payer: Medicare HMO | Source: Ambulatory Visit | Attending: Radiation Oncology | Admitting: Radiation Oncology

## 2021-01-03 DIAGNOSIS — Z510 Encounter for antineoplastic radiation therapy: Secondary | ICD-10-CM | POA: Diagnosis not present

## 2021-01-04 ENCOUNTER — Ambulatory Visit
Admission: RE | Admit: 2021-01-04 | Discharge: 2021-01-04 | Disposition: A | Discharge status: Home / Self Care | Payer: Medicare HMO | Source: Ambulatory Visit | Attending: Radiation Oncology | Admitting: Radiation Oncology

## 2021-01-04 DIAGNOSIS — Z510 Encounter for antineoplastic radiation therapy: Secondary | ICD-10-CM | POA: Diagnosis not present

## 2021-01-07 ENCOUNTER — Other Ambulatory Visit: Payer: Self-pay

## 2021-01-07 ENCOUNTER — Encounter: Payer: Self-pay | Admitting: Radiation Oncology

## 2021-01-07 ENCOUNTER — Ambulatory Visit
Admission: RE | Admit: 2021-01-07 | Discharge: 2021-01-07 | Disposition: A | Discharge status: Home / Self Care | Payer: Medicare HMO | Source: Ambulatory Visit | Attending: Radiation Oncology | Admitting: Radiation Oncology

## 2021-01-07 DIAGNOSIS — Z510 Encounter for antineoplastic radiation therapy: Secondary | ICD-10-CM | POA: Diagnosis not present

## 2021-01-09 NOTE — Progress Notes (Signed)
  Patient Name: Richard Vang MRN: [id number] DOB: Feb 13, 1946 Referring Physician: Sullivan Lone (Profile Not Attached) Date of Service: 01/07/2021  [facility] [place]                                                        End Of Treatment Note  Diagnoses: C79.51-Secondary malignant neoplasm of bone  Cancer Staging: Multiple myeloma not having achieved remission  Intent: Palliative  Radiation Treatment Dates: 12/25/2020 through 01/07/2021 Site Technique Total Dose (Gy) Dose per Fx (Gy) Completed Fx Beam Energies  Pelvis: Pelvis 3D 25/25 2.5 10/10 10X, 15X   Narrative: The patient tolerated radiation therapy relatively well. He did have some looser stools at the completion of radiotherapy.  Plan:The patient will receive a call in about one month from the radiation oncology department. He will continue follow up with Dr. Irene Limbo as well.  ________________________________________________    Carola Rhine, [facility]

## 2021-01-10 ENCOUNTER — Telehealth: Payer: Self-pay | Admitting: Licensed Clinical Social Worker

## 2021-01-10 NOTE — Telephone Encounter (Signed)
LCSW has f/u with pt regarding Extra Help application completed with this writer to see if he has gotten any additional documentation or determination sent to him. No answer, HIPAA compliant message left for pt on [phone].   Westley Hummer, MSW, [place]  [phone]

## 2021-01-14 ENCOUNTER — Telehealth: Payer: Self-pay | Admitting: Licensed Clinical Social Worker

## 2021-01-14 NOTE — Telephone Encounter (Signed)
Received a call back from pt ex-wife, she shares they did get some paperwork from Extra Help program, she plans on filling it out and sending back in. I let her know that I can assist with that as needed (fax/mail document). She will call if interested in assistance w/ sending paperwork back.   Westley Hummer, MSW, [place]  [phone]

## 2021-01-15 ENCOUNTER — Telehealth: Payer: Self-pay | Admitting: Licensed Clinical Social Worker

## 2021-01-15 ENCOUNTER — Other Ambulatory Visit (HOSPITAL_COMMUNITY): Payer: Self-pay

## 2021-01-15 ENCOUNTER — Telehealth: Payer: Self-pay | Admitting: Hematology

## 2021-01-15 NOTE — Telephone Encounter (Signed)
R/s appts per 3/25 sch msg. Per MD okay to move appts to end of June. Called pt, no answer. Left vm with updated appts date and times.

## 2021-01-15 NOTE — Telephone Encounter (Signed)
Received additional call from pt ex wife [name] [phone]) sharing that they are unsure how to get billing statements requested by Extra Help program. LCSW shared that pt could request those from billing office and provided her with that number. Encouraged her to contact caseworker and let her know about delay with obtaining documents. She will call me if there are any additional questions/concerns.   Westley Hummer, MSW, [place]  [phone]

## 2021-01-17 MED FILL — NINLARO 3 MG CAPS: 3 | 28 days supply | Qty: 3 | Fill #1

## 2021-01-28 ENCOUNTER — Telehealth: Payer: Self-pay | Admitting: Licensed Clinical Social Worker

## 2021-01-28 NOTE — Progress Notes (Signed)
HEMATOLOGY/ONCOLOGY CLINIC NOTE  Date of Service: 01/29/21    Patient Care Team: Sandi Mariscal, MD as PCP - General (Internal Medicine) Lorretta Harp, MD as PCP - Cardiology (Cardiology)  CHIEF COMPLAINTS/PURPOSE OF CONSULTATION:   F/u for continued management of Myeloma   HISTORY OF PRESENTING ILLNESS:  plz see previous note for details on HPI  INTERVAL HISTORY: Richard Vang is here for his scheduled followup for multiple myeloma. Richard patient's last visit with [name] was on 12/03/2020. Richard Vang reports that he is doing well overall.  Richard Vang reports that he is done with radiation, but still has Richard back pains. Richard Vang notes everything is very stable recently. Richard Vang notes that he cannot see out of his right eye due to redness and eye issues. Richard Vang notes this has been a couple months and more gradual. Richard Vang notes that he has some fatty tissue growing that is covering his eye. Richard Vang notes that he believes he has cataracts, as Richard fatty tissue is to Richard outer area of his eye. This has been getting progressively worse. Richard Vang notes no issues tolerating Richard continued Ninlaro.  Lab results today 01/29/2021 of CBC w/diff and CMP is as follows: all values are WNL except for RBC of 3.84, Hgb of 11.0, HCT of 35.4, RDW of 18.4, Plt of 92K, Lymphs Abs of 0.5K, Glucose of 117, BUN of 46, Creatinine of 1.99, GFR est of 35. 01/29/2021 MMP - no M spike 01/29/2021 Light Chain - stable.  On review of systems, Vang reports blurred vision, fatty tissue in eye, vision problems, chronic back pain and denies abdominal pain, leg swelling, SOB, changes in breathing, and any other symptoms.  MEDICAL HISTORY:  Past Medical History:  Diagnosis Date  . Arthritis    "left shoulder" (07/31/2016)  . Coronary artery disease   . GERD (gastroesophageal reflux disease)   . Heart murmur   . High cholesterol   . Hypertension   . Multiple myeloma ([place])   . Sleep apnea    "probably; having test in November" (07/31/2016)   . Type II diabetes mellitus ([place])     SURGICAL HISTORY: Past Surgical History:  Procedure Laterality Date  . CARDIAC CATHETERIZATION N/A 07/31/2016   Procedure: Left Heart Cath and Coronary Angiography;  Surgeon: Lorretta Harp, MD;  Location: [facility];  Service: Cardiovascular;  Laterality: N/A;  . CARDIAC CATHETERIZATION N/A 07/31/2016   Procedure: Coronary Stent Intervention;  Surgeon: Lorretta Harp, MD;  Location: [facility];  Service: Cardiovascular;  Laterality: N/A;  . CORONARY ANGIOPLASTY    . OPEN REDUCTION INTERNAL FIXATION (ORIF) METACARPAL Left 05/21/2018   Procedure: OPEN REDUCTION INTERNAL FIXATION (ORIF) 2ND METACARPAL;  Surgeon: Shona Needles, MD;  Location: [facility];  Service: Orthopedics;  Laterality: Left;  . ORIF CLAVICULAR FRACTURE Right 05/21/2018   Procedure: OPEN REDUCTION INTERNAL FIXATION (ORIF) CLAVICULAR FRACTURE;  Surgeon: Shona Needles, MD;  Location: [facility];  Service: Orthopedics;  Laterality: Right;  . SHOULDER SURGERY Left 1973   "put pin in it where it had separated"   . TONSILLECTOMY  ~ 1956    SOCIAL HISTORY: Social History   Socioeconomic History  . Marital status: Divorced    Spouse name: Not on file  . Number of children: Not on file  . Years of education: Not on file  . Highest education level: Not on file  Occupational History  . Not on file  Tobacco Use  .  Smoking status: Never Smoker  . Smokeless tobacco: Never Used  Vaping Use  . Vaping Use: Never used  Substance and Sexual Activity  . Alcohol use: Not Currently    Comment: 07/31/2016 "nothing since 2002"  . Drug use: No  . Sexual activity: Not Currently  Other Topics Concern  . Not on file  Social History Narrative  . Not on file   Social Determinants of Health   Financial Resource Strain: Low Risk   . Difficulty of Paying Living Expenses: Not hard at all  Food Insecurity: No Food Insecurity  . Worried About Charity fundraiser in Richard Last Year: Never  true  . Ran Out of Food in Richard Last Year: Never true  Transportation Needs: No Transportation Needs  . Lack of Transportation (Medical): No  . Lack of Transportation (Non-Medical): No  Physical Activity: Inactive  . Days of Exercise per Week: 0 days  . Minutes of Exercise per Session: 0 min  Stress: Not on file  Social Connections: Moderately Integrated  . Frequency of Communication with Friends and Family: More than three times a week  . Frequency of Social Gatherings with Friends and Family: More than three times a week  . Attends Religious Services: Never  . Active Member of Clubs or Organizations: Yes  . Attends Archivist Meetings: Never  . Marital Status: Living with partner  Intimate Partner Violence: Not At Risk  . Fear of Current or Ex-Partner: No  . Emotionally Abused: No  . Physically Abused: No  . Sexually Abused: No    FAMILY HISTORY: Family History  Problem Relation Age of Onset  . Hypertension Other   . Lung cancer Mother     ALLERGIES:  has No Known Allergies.  MEDICATIONS:  Current Outpatient Medications  Medication Sig Dispense Refill  . acyclovir (ZOVIRAX) 400 MG tablet Take 1 tablet by mouth twice daily 60 tablet 1  . amLODipine (NORVASC) 5 MG tablet Take 5 mg by mouth daily.    [name] atorvastatin (LIPITOR) 40 MG tablet Take 1 tablet (40 mg total) by mouth at bedtime. 30 tablet 12  . carvedilol (COREG) 12.5 MG tablet Take 12.5 mg by mouth 2 (two) times daily with a meal.    . clopidogrel (PLAVIX) 75 MG tablet Take 1 tablet (75 mg total) by mouth daily with breakfast. 30 tablet 12  . cyclobenzaprine (FLEXERIL) 10 MG tablet Take 10 mg by mouth daily as needed for muscle spasms.   2  . escitalopram (LEXAPRO) 10 MG tablet Take 10 mg by mouth daily.    . furosemide (LASIX) 80 MG tablet Take 80 mg by mouth daily.    . isosorbide mononitrate (IMDUR) 30 MG 24 hr tablet Take 1 tablet (30 mg total) by mouth daily. 90 tablet 3  . ixazomib citrate (NINLARO) 3  MG capsule TAKE 1 CAPSULE (3 MG TOTAL) BY MOUTH ONCE A WEEK. ON DAY 1,8,15 EVERY 28 DAYS. TAKE ON AN EMPTY STOMACH 1HR BEFORE OR 2HRS AFTER FOOD. 3 capsule 1  . montelukast (SINGULAIR) 10 MG tablet Take 10 mg by mouth daily.    [name] omeprazole (PRILOSEC) 40 MG capsule Take 40 mg by mouth daily with breakfast.     . oxyCODONE-acetaminophen (PERCOCET) 10-325 MG tablet Take 1 tablet by mouth 2 (two) times daily. 10 tablet 0  . Potassium Chloride CR (MICRO-K) 8 MEQ CPCR capsule CR Take 8 mEq by mouth daily.    . SYMBICORT 160-4.5 MCG/ACT inhaler Inhale 2 puffs into  Richard lungs 2 (two) times daily as needed (shortness of breath).     . Vitamin D, Ergocalciferol, (DRISDOL) 1.25 MG (50000 UNIT) CAPS capsule Take 1 capsule by mouth once a week 12 capsule 0  . warfarin (COUMADIN) 5 MG tablet Take 5 mg by mouth daily.     No current facility-administered medications for this visit.   Facility-Administered Medications Ordered in Other Visits  Medication Dose Route Frequency Provider Last Rate Last Admin  . 0.9 %  sodium chloride infusion   Intravenous Continuous Brunetta Genera, MD 10 mL/hr at 07/09/17 1423 New Bag at 07/09/17 1423    REVIEW OF SYSTEMS:   10 Point review of Systems was done is negative except as noted above.  PHYSICAL EXAMINATION:  ECOG FS:2 - Symptomatic, <50% confined to bed  Vitals:   01/29/21 0947  BP: 135/88  Pulse: 74  Resp: 19  Temp: 98.4 F (36.9 C)  SpO2: 96%   Wt Readings from Last 3 Encounters:  01/29/21 224 lb 8 oz (101.8 kg)  12/21/20 216 lb 12.8 oz (98.3 kg)  12/13/20 217 lb (98.4 kg)   Body mass index is 33.15 kg/m.    Exam was given in a chair. GENERAL:alert, in no acute distress and comfortable SKIN: no acute rashes, no significant lesions EYES: fatty tissue in right eye with redness and swelling OROPHARYNX: MMM, no exudates, no oropharyngeal erythema or ulceration NECK: supple, no JVD LYMPH:  no palpable lymphadenopathy in Richard cervical, axillary or  inguinal regions LUNGS: clear to auscultation b/l with normal respiratory effort HEART: regular rate & rhythm ABDOMEN:  normoactive bowel sounds , non tender, not distended. Extremity: no pedal edema PSYCH: alert & oriented x 3 with fluent speech NEURO: no focal motor/sensory deficits  LABORATORY DATA:  I have reviewed Richard data as listed  CBC Latest Ref Rng & Units 01/29/2021 12/03/2020 10/01/2020  WBC 4.0 - 10.5 K/uL 4.2 4.9 4.9  Hemoglobin 13.0 - 17.0 g/dL 11.0(L) 11.6(L) 10.4(L)  Hematocrit 39.0 - 52.0 % 35.4(L) 39.1 33.8(L)  Platelets 150 - 400 K/uL 92(L) 106(L) 136(L)    CBC    Component Value Date/Time   WBC 4.2 01/29/2021 0857   RBC 3.84 (L) 01/29/2021 0857   HGB 11.0 (L) 01/29/2021 0857   HGB 11.3 (L) 01/25/2020 1000   HGB 9.2 (L) 10/01/2017 1112   HCT 35.4 (L) 01/29/2021 0857   HCT 29.3 (L) 10/01/2017 1112   PLT 92 (L) 01/29/2021 0857   PLT 147 (L) 01/25/2020 1000   PLT 170 10/01/2017 1112   MCV 92.2 01/29/2021 0857   MCV 88.5 10/01/2017 1112   MCH 28.6 01/29/2021 0857   MCHC 31.1 01/29/2021 0857   RDW 18.4 (H) 01/29/2021 0857   RDW 15.3 (H) 10/01/2017 1112   LYMPHSABS 0.5 (L) 01/29/2021 0857   LYMPHSABS 0.5 (L) 10/01/2017 1112   MONOABS 0.4 01/29/2021 0857   MONOABS 0.5 10/01/2017 1112   EOSABS 0.2 01/29/2021 0857   EOSABS 0.1 10/01/2017 1112   BASOSABS 0.0 01/29/2021 0857   BASOSABS 0.0 10/01/2017 1112    . CMP Latest Ref Rng & Units 01/29/2021 12/03/2020 10/01/2020  Glucose 70 - 99 mg/dL 117(H) 87 115(H)  BUN 8 - 23 mg/dL 46(H) 31(H) 32(H)  Creatinine 0.61 - 1.24 mg/dL 1.99(H) 1.93(H) 1.76(H)  Sodium 135 - 145 mmol/L 143 141 143  Potassium 3.5 - 5.1 mmol/L 4.5 4.0 4.1  Chloride 98 - 111 mmol/L 109 106 113(H)  CO2 22 - 32 mmol/L 22 25 22  Calcium 8.9 - 10.3 mg/dL 9.1 9.4 9.2  Total Protein 6.5 - 8.1 g/dL 7.3 7.6 6.9  Total Bilirubin 0.3 - 1.2 mg/dL 1.0 1.1 0.9  Alkaline Phos 38 - 126 U/L 82 85 76  AST 15 - 41 U/L _0 ALT 0 - 44 U/L _1 03/18/2019 MMP: Component     Latest Ref Rng & Units 03/18/2019  IgG (Immunoglobin G), Serum     603 - 1,613 mg/dL 1,012  IgA     61 - 437 mg/dL 51 (L)  IgM (Immunoglobulin M), Srm     15 - 143 mg/dL 27  Total Protein ELP     6.0 - 8.5 g/dL 6.3  Albumin SerPl Elph-Mcnc     2.9 - 4.4 g/dL 3.8  Alpha 1     0.0 - 0.4 g/dL 0.2  Alpha2 Glob SerPl Elph-Mcnc     0.4 - 1.0 g/dL 0.7  B-Globulin SerPl Elph-Mcnc     0.7 - 1.3 g/dL 0.8  Gamma Glob SerPl Elph-Mcnc     0.4 - 1.8 g/dL 0.8  M Protein SerPl Elph-Mcnc     Not Observed g/dL Not Observed  Globulin, Total     2.2 - 3.9 g/dL 2.5  Albumin/Glob SerPl     0.7 - 1.7 1.6  IFE 1      Comment  Please Note (HCV):      Comment    RADIOGRAPHIC STUDIES: I have personally reviewed Richard radiological images as listed and agreed with Richard findings in Richard report. No results found.  ASSESSMENT & PLAN:   75 y.o. male with multiple medical co-morbidities including hypertension, diabetes, dyslipidemia, coronary artery disease status post drug-eluting PCI on 07/31/2016 and newly noted possibly ischemic cardiomyopathy ejection fraction 25-35% (rpt ECHO improvement to 55-60%) with   1) Light chain (kappa) Multiple myeloma with Lytic lesion with aggressive features in Richard right iliac bone and possibly other lytic lesions in Richard L spine , anemia, hypercalcemia and renal insuff (diagnosed in 09/2016) bone marrow bx -- shows 67%-80 %plasma cells consistent with multiple myeloma.  K/L 95, No M spike on SPEP -- suggests light chain MM Cytogenetics - normal male chromosomes FISH- +11 and 13q-/-13  Patient is s/p 1 cycle of Vd and Serum free kappa LC has decreased from 700 to 203.6 with improvement in his K/L ratio from 94.59 to 29. Completed 2nd cycle of treatment with Vd + Cytoxan (230m/m2) Completed 3rd cycle of treatment with Vd + Cytoxan (2015mm2) - cytoxan held D15 due to thrombocytopenia Completed 4th of VCd Discontinued VCd after C5D8 due  to intolerance with diarrhea and increasing neuropathy and fatigue with borderline functional status. -Patient did not tolerate maintenance Revlimid 10 mg by mouth daily due to grade 2-3 diarrhea. This was discontinued and his diarrhea has resolved.  Currently on maintenance Ninlaro M spike absent   11/15/18 CT Pelvic revealed  3 mm stone in Richard right UVJ. 5 x 6 mm stone is identified in Richard left UVJ. No associated distal hydroureter. 2. Stable lucent lesion in Richard right iliac bone. 3. Small right groin hernia contains only fat. 11/15/18 CT Lumbar revealed  No acute fracture or malalignment. 2. Severe osteopenia and multiple old compression fractures, severe at T11 and T12. 3. Severe canal stenosis L3-4. Multilevel neural foraminal narrowing: Moderate to severe at T12-L1. 4. Slowly enlarging solid LEFT renal mass. Recommend non emergent MRI renal mass protocol on non emergent basis -No obvious new myeloma  involvement seen in most recent CT imaging.         2) CAD s/p prox LAD DES PCI on 07/31/2016 with ischemic cardiomyopathy ejection fraction of 25-35%. Rpt ECHO on 10/03/2016 shows improvement in EF to 55-60% with no RWMABN.  3) Macrocytic Anemia with moderate thrombocytopenia - due to MM and and Ninlaro. Stable.  4) Thrombocytopenia - due to MM and treatment --improved today 108k. --will monitor  5) B12 deficiency - Plan -cont SL B12 101mg po daily  6) h/o Afib with RVR on coumadin Some bradycardia with BB -continue mx per PCP and cardiology  7) Back pain -- multiple chronic compression fractures with some worsening -On bisphosphonates already -Ergocalciferol 50k units weekly -Advised that Richard Vang not lift anything heavy  -Discussed Richard 11/15/18 CT imaging, as noted above, did refer Vang to orthopedist -Vang has been evaluated by orthopedist in December 2020  PLAN:  -Discussed Vang labwork today, 01/29/2021; Hgb stable, creatinine normal, Plt stable. Stable light chains -Recommended Vang  f/u w Optometrist ASAP regarding blurred vision and redness in right eye. -Recommended Vang take a family member with him to eye appointment to ask questions as well. -Richard Vang has no prohibitive toxicities from continuing Ninlaro at this time. -Continue Aredia q8weeks. -Continue weekly Ergocalciferol. -Will see back in 2 months with labs as scheduled.   FOLLOW UP: Return to clinic as per scheduled appointments on 04/15/2021   Richard total time spent in Richard appointment was 20 minutes and more than 50% was on counseling and direct patient cares.  All of Richard patient's questions were answered with apparent satisfaction. Richard patient knows to call Richard clinic with any problems, questions or concerns.   GSullivan LoneMD MPlymouthAAHIVMS SPromise Hospital Of [facility] Rouge, [facility].CUniversity Medical Center Of El PasoHematology/Oncology Physician CParview [facility] (Office):       [phone](Work cell):  [phone](Fax):           [phone] I, RReinaldo Raddle am acting as scribe for Dr. GSullivan Lone MD.   .I have reviewed Richard above documentation for accuracy and completeness, and I agree with Richard above.   .Brunetta GeneraMD

## 2021-01-28 NOTE — Telephone Encounter (Signed)
Received letter of denial for Extra Help program which pt spouse had faxed to this office. LCSW provided this to pharmacy in case it was needed for additional patient assistance programs, if not then can be scanned into pt chart per medical records.   Westley Hummer, MSW, [place]  [phone]

## 2021-01-29 ENCOUNTER — Other Ambulatory Visit: Payer: Self-pay

## 2021-01-29 ENCOUNTER — Inpatient Hospital Stay: Payer: Medicare HMO

## 2021-01-29 ENCOUNTER — Inpatient Hospital Stay: Payer: Medicare HMO | Admitting: Hematology

## 2021-01-29 ENCOUNTER — Inpatient Hospital Stay: Payer: Medicare HMO | Attending: Hematology

## 2021-01-29 VITALS — BP 135/88 | HR 74 | Temp 98.4°F | Resp 19 | Ht 69.0 in | Wt 224.5 lb

## 2021-01-29 DIAGNOSIS — E1140 Type 2 diabetes mellitus with diabetic neuropathy, unspecified: Secondary | ICD-10-CM | POA: Insufficient documentation

## 2021-01-29 DIAGNOSIS — D696 Thrombocytopenia, unspecified: Secondary | ICD-10-CM | POA: Diagnosis not present

## 2021-01-29 DIAGNOSIS — I255 Ischemic cardiomyopathy: Secondary | ICD-10-CM | POA: Diagnosis not present

## 2021-01-29 DIAGNOSIS — K219 Gastro-esophageal reflux disease without esophagitis: Secondary | ICD-10-CM | POA: Diagnosis not present

## 2021-01-29 DIAGNOSIS — R001 Bradycardia, unspecified: Secondary | ICD-10-CM | POA: Diagnosis not present

## 2021-01-29 DIAGNOSIS — Z923 Personal history of irradiation: Secondary | ICD-10-CM | POA: Diagnosis not present

## 2021-01-29 DIAGNOSIS — H538 Other visual disturbances: Secondary | ICD-10-CM | POA: Insufficient documentation

## 2021-01-29 DIAGNOSIS — D6959 Other secondary thrombocytopenia: Secondary | ICD-10-CM | POA: Diagnosis not present

## 2021-01-29 DIAGNOSIS — C7951 Secondary malignant neoplasm of bone: Secondary | ICD-10-CM

## 2021-01-29 DIAGNOSIS — M549 Dorsalgia, unspecified: Secondary | ICD-10-CM | POA: Diagnosis not present

## 2021-01-29 DIAGNOSIS — Z7901 Long term (current) use of anticoagulants: Secondary | ICD-10-CM | POA: Insufficient documentation

## 2021-01-29 DIAGNOSIS — C9000 Multiple myeloma not having achieved remission: Secondary | ICD-10-CM

## 2021-01-29 DIAGNOSIS — Z7189 Other specified counseling: Secondary | ICD-10-CM

## 2021-01-29 DIAGNOSIS — E7800 Pure hypercholesterolemia, unspecified: Secondary | ICD-10-CM | POA: Insufficient documentation

## 2021-01-29 DIAGNOSIS — M1990 Unspecified osteoarthritis, unspecified site: Secondary | ICD-10-CM | POA: Insufficient documentation

## 2021-01-29 DIAGNOSIS — D539 Nutritional anemia, unspecified: Secondary | ICD-10-CM | POA: Insufficient documentation

## 2021-01-29 DIAGNOSIS — Z79899 Other long term (current) drug therapy: Secondary | ICD-10-CM | POA: Insufficient documentation

## 2021-01-29 DIAGNOSIS — Z7951 Long term (current) use of inhaled steroids: Secondary | ICD-10-CM | POA: Insufficient documentation

## 2021-01-29 DIAGNOSIS — G8929 Other chronic pain: Secondary | ICD-10-CM | POA: Diagnosis not present

## 2021-01-29 DIAGNOSIS — I2510 Atherosclerotic heart disease of native coronary artery without angina pectoris: Secondary | ICD-10-CM | POA: Insufficient documentation

## 2021-01-29 DIAGNOSIS — E538 Deficiency of other specified B group vitamins: Secondary | ICD-10-CM | POA: Diagnosis not present

## 2021-01-29 DIAGNOSIS — I4891 Unspecified atrial fibrillation: Secondary | ICD-10-CM | POA: Diagnosis not present

## 2021-01-29 DIAGNOSIS — I10 Essential (primary) hypertension: Secondary | ICD-10-CM | POA: Insufficient documentation

## 2021-01-29 LAB — CBC WITH DIFFERENTIAL/PLATELET
Abs Immature Granulocytes: 0.01 10*3/uL (ref 0.00–0.07)
Basophils Absolute: 0 10*3/uL (ref 0.0–0.1)
Basophils Relative: 1 %
Eosinophils Absolute: 0.2 10*3/uL (ref 0.0–0.5)
Eosinophils Relative: 4 %
HCT: 35.4 % — ABNORMAL LOW (ref 39.0–52.0)
Hemoglobin: 11 g/dL — ABNORMAL LOW (ref 13.0–17.0)
Immature Granulocytes: 0 %
Lymphocytes Relative: 13 %
Lymphs Abs: 0.5 10*3/uL — ABNORMAL LOW (ref 0.7–4.0)
MCH: 28.6 pg (ref 26.0–34.0)
MCHC: 31.1 g/dL (ref 30.0–36.0)
MCV: 92.2 fL (ref 80.0–100.0)
Monocytes Absolute: 0.4 10*3/uL (ref 0.1–1.0)
Monocytes Relative: 10 %
Neutro Abs: 3.1 10*3/uL (ref 1.7–7.7)
Neutrophils Relative %: 72 %
Platelets: 92 10*3/uL — ABNORMAL LOW (ref 150–400)
RBC: 3.84 MIL/uL — ABNORMAL LOW (ref 4.22–5.81)
RDW: 18.4 % — ABNORMAL HIGH (ref 11.5–15.5)
WBC: 4.2 10*3/uL (ref 4.0–10.5)
nRBC: 0 % (ref 0.0–0.2)

## 2021-01-29 LAB — CMP (CANCER CENTER ONLY)
ALT: 11 U/L (ref 0–44)
AST: 22 U/L (ref 15–41)
Albumin: 4 g/dL (ref 3.5–5.0)
Alkaline Phosphatase: 82 U/L (ref 38–126)
Anion gap: 12 (ref 5–15)
BUN: 46 mg/dL — ABNORMAL HIGH (ref 8–23)
CO2: 22 mmol/L (ref 22–32)
Calcium: 9.1 mg/dL (ref 8.9–10.3)
Chloride: 109 mmol/L (ref 98–111)
Creatinine: 1.99 mg/dL — ABNORMAL HIGH (ref 0.61–1.24)
GFR, Estimated: 35 mL/min — ABNORMAL LOW (ref >60)
Glucose, Bld: 117 mg/dL — ABNORMAL HIGH (ref 70–99)
Potassium: 4.5 mmol/L (ref 3.5–5.1)
Sodium: 143 mmol/L (ref 135–145)
Total Bilirubin: 1 mg/dL (ref 0.3–1.2)
Total Protein: 7.3 g/dL (ref 6.5–8.1)

## 2021-01-29 MED ORDER — SODIUM CHLORIDE 0.9 % IV SOLN
INTRAVENOUS | Status: DC
  Filled 2021-01-29: qty 250

## 2021-01-29 MED ORDER — SODIUM CHLORIDE 0.9 % IV SOLN
60.0000 mg | Freq: Once | INTRAVENOUS | Status: AC
  Administered 2021-01-29: 60 mg via INTRAVENOUS
  Filled 2021-01-29: qty 10

## 2021-01-29 NOTE — Patient Instructions (Signed)
Pamidronate Injection What is this medicine? PAMIDRONATE (pa mi DROE nate) slows calcium loss from bones. It treats Paget's disease and high calcium levels in the blood from some kinds of cancer. It may be used in other people at risk for bone loss. This medicine may be used for other purposes; ask your health care provider or pharmacist if you have questions. COMMON BRAND NAME(S): Aredia What should I tell my health care provider before I take this medicine? They need to know if you have any of these conditions:  bleeding disorder  cancer  dental disease  kidney disease  low levels of calcium or other minerals in the blood  low red blood cell counts  receiving steroids like dexamethasone or prednisone  an unusual or allergic reaction to pamidronate, other drugs, foods, dyes or preservatives  pregnant or trying to get pregnant  breast-feeding How should I use this medicine? This drug is injected into a vein. It is given by a health care provider in a hospital or clinic setting. Talk to your health care provider about the use of this drug in children. Special care may be needed. Overdosage: If you think you have taken too much of this medicine contact a poison control center or emergency room at once. NOTE: This medicine is only for you. Do not share this medicine with others. What if I miss a dose? Keep appointments for follow-up doses. It is important not to miss your dose. Call your health care provider if you are unable to keep an appointment. What may interact with this medicine?  certain antibiotics given by injection  medicines for inflammation or pain like ibuprofen, naproxen  some diuretics like bumetanide, furosemide  cyclosporine  parathyroid hormone  tacrolimus  teriparatide  thalidomide This list may not describe all possible interactions. Give your health care provider a list of all the medicines, herbs, non-prescription drugs, or dietary supplements  you use. Also tell them if you smoke, drink alcohol, or use illegal drugs. Some items may interact with your medicine. What should I watch for while using this medicine? Visit your health care provider for regular checks on your progress. It may be some time before you see the benefit from this drug. Some people who take this drug have severe bone, joint, or muscle pain. This drug may also increase your risk for jaw problems or a broken thigh bone. Tell your health care provider right away if you have severe pain in your jaw, bones, joints, or muscles. Tell you health care provider if you have any pain that does not go away or that gets worse. Tell your dentist and dental surgeon that you are taking this drug. You should not have major dental surgery while on this drug. See your dentist to have a dental exam and fix any dental problems before starting this drug. Take good care of your teeth while on this drug. Make sure you see your dentist for regular follow-up appointments. You should make sure you get enough calcium and vitamin D while you are taking this drug. Discuss the foods you eat and the vitamins you take with your health care provider. You may need blood work done while you are taking this drug. Do not become pregnant while taking this drug. Women should inform their health care provider if they wish to become pregnant or think they might be pregnant. There is potential for serious harm to an unborn child. Talk to your health care provider for more information. What side effects   may I notice from receiving this medicine? Side effects that you should report to your doctor or health care provider as soon as possible:  allergic reactions (skin rash, itching or hives; swelling of the face, lips, or tongue)  bleeding (bloody or black, tarry stools; red or dark brown urine; spitting up blood or brown material that looks like coffee grounds; red spots on the skin; unusual bruising or bleeding from  the eyes, gums, or nose)  bone pain  increased thirst  infection (fever, chills, cough, sore throat, pain or trouble passing urine)  jaw pain, especially after dental work  joint pain  kidney injury (trouble passing urine or change in the amount of urine)  low calcium levels (fast heartbeat; muscle cramps or pain; pain, tingling, or numbness in the hands or feet; seizures)  low magnesium levels (fast, irregular heartbeat; muscle cramp or pain; muscle weakness; tremors; seizures)  low potassium levels (trouble breathing; chest pain; dizziness; fast, irregular heartbeat; feeling faint or lightheaded, falls; muscle cramps or pain)  muscle pain  pain, redness, or irritation at site where injected  redness, blistering, peeling, or loosening of the skin, including inside the mouth  severe diarrhea  unusual sweating Side effects that usually do not require medical attention (report to your doctor or health care provider if they continue or are bothersome):  constipation  eye irritation, itching, or pain  fever  headache  increase in blood pressure  loss of appetite  nausea  stomach pain  unusually weak or tired  vomiting This list may not describe all possible side effects. Call your doctor for medical advice about side effects. You may report side effects to FDA at 1-800-FDA-1088. Where should I keep my medicine? This drug is given in a hospital or clinic. It will not be stored at home. NOTE: This sheet is a summary. It may not cover all possible information. If you have questions about this medicine, talk to your doctor, pharmacist, or health care provider.  2021 Elsevier/Gold Standard (2019-08-29 12:19:52)  

## 2021-01-30 LAB — KAPPA/LAMBDA LIGHT CHAINS
Kappa free light chain: 85.5 mg/L — ABNORMAL HIGH (ref 3.3–19.4)
Kappa, lambda light chain ratio: 4.25 — ABNORMAL HIGH (ref 0.26–1.65)
Lambda free light chains: 20.1 mg/L (ref 5.7–26.3)

## 2021-01-31 LAB — MULTIPLE MYELOMA PANEL, SERUM
Albumin SerPl Elph-Mcnc: 4 g/dL (ref 2.9–4.4)
Albumin/Glob SerPl: 1.3 (ref 0.7–1.7)
Alpha 1: 0.2 g/dL (ref 0.0–0.4)
Alpha2 Glob SerPl Elph-Mcnc: 0.8 g/dL (ref 0.4–1.0)
B-Globulin SerPl Elph-Mcnc: 0.9 g/dL (ref 0.7–1.3)
Gamma Glob SerPl Elph-Mcnc: 1.3 g/dL (ref 0.4–1.8)
Globulin, Total: 3.2 g/dL (ref 2.2–3.9)
IgA: 79 mg/dL (ref 61–437)
IgG (Immunoglobin G), Serum: 1316 mg/dL (ref 603–1613)
IgM (Immunoglobulin M), Srm: 30 mg/dL (ref 15–143)
Total Protein ELP: 7.2 g/dL (ref 6.0–8.5)

## 2021-02-11 ENCOUNTER — Other Ambulatory Visit: Payer: Self-pay | Admitting: Hematology

## 2021-02-11 DIAGNOSIS — C9000 Multiple myeloma not having achieved remission: Secondary | ICD-10-CM

## 2021-02-12 ENCOUNTER — Other Ambulatory Visit (HOSPITAL_COMMUNITY): Payer: Self-pay

## 2021-02-12 ENCOUNTER — Other Ambulatory Visit: Payer: Self-pay | Admitting: Hematology

## 2021-02-12 DIAGNOSIS — C9000 Multiple myeloma not having achieved remission: Secondary | ICD-10-CM

## 2021-02-12 MED ORDER — IXAZOMIB CITRATE 3 MG PO CAPS
ORAL_CAPSULE | ORAL | 1 refills | Status: DC
  Filled 2021-02-12: qty 3, 28d supply, fill #0
  Filled 2021-03-12: qty 3, 28d supply, fill #1

## 2021-02-14 ENCOUNTER — Other Ambulatory Visit (HOSPITAL_COMMUNITY): Payer: Self-pay

## 2021-02-18 ENCOUNTER — Other Ambulatory Visit (HOSPITAL_COMMUNITY): Payer: Self-pay

## 2021-02-25 ENCOUNTER — Other Ambulatory Visit (HOSPITAL_COMMUNITY): Payer: Self-pay

## 2021-03-11 ENCOUNTER — Ambulatory Visit
Admission: RE | Admit: 2021-03-11 | Discharge: 2021-03-11 | Disposition: A | Discharge status: Home / Self Care | Payer: Medicare HMO | Source: Ambulatory Visit | Attending: Radiation Oncology | Admitting: Radiation Oncology

## 2021-03-11 DIAGNOSIS — C9000 Multiple myeloma not having achieved remission: Secondary | ICD-10-CM

## 2021-03-11 DIAGNOSIS — C7951 Secondary malignant neoplasm of bone: Secondary | ICD-10-CM | POA: Insufficient documentation

## 2021-03-11 DIAGNOSIS — Z510 Encounter for antineoplastic radiation therapy: Secondary | ICD-10-CM | POA: Insufficient documentation

## 2021-03-12 ENCOUNTER — Other Ambulatory Visit (HOSPITAL_COMMUNITY): Payer: Self-pay

## 2021-03-12 ENCOUNTER — Ambulatory Visit: Payer: Medicare HMO | Admitting: Cardiovascular Disease

## 2021-03-12 ENCOUNTER — Encounter: Payer: Self-pay | Admitting: Cardiovascular Disease

## 2021-03-12 ENCOUNTER — Other Ambulatory Visit: Payer: Self-pay | Admitting: Hematology

## 2021-03-12 ENCOUNTER — Other Ambulatory Visit: Payer: Self-pay

## 2021-03-12 DIAGNOSIS — I482 Chronic atrial fibrillation, unspecified: Secondary | ICD-10-CM

## 2021-03-12 DIAGNOSIS — I712 Thoracic aortic aneurysm, without rupture, unspecified: Secondary | ICD-10-CM

## 2021-03-12 DIAGNOSIS — I10 Essential (primary) hypertension: Secondary | ICD-10-CM | POA: Diagnosis not present

## 2021-03-12 DIAGNOSIS — E785 Hyperlipidemia, unspecified: Secondary | ICD-10-CM

## 2021-03-12 DIAGNOSIS — R0609 Other forms of dyspnea: Secondary | ICD-10-CM

## 2021-03-12 DIAGNOSIS — Z9861 Coronary angioplasty status: Secondary | ICD-10-CM

## 2021-03-12 DIAGNOSIS — R0600 Dyspnea, unspecified: Secondary | ICD-10-CM

## 2021-03-12 DIAGNOSIS — I2510 Atherosclerotic heart disease of native coronary artery without angina pectoris: Secondary | ICD-10-CM | POA: Diagnosis not present

## 2021-03-12 DIAGNOSIS — C9000 Multiple myeloma not having achieved remission: Secondary | ICD-10-CM

## 2021-03-12 LAB — BASIC METABOLIC PANEL
BUN/Creatinine Ratio: 21 (ref 10–24)
BUN: 39 mg/dL — ABNORMAL HIGH (ref 8–27)
CO2: 22 mmol/L (ref 20–29)
Calcium: 9.2 mg/dL (ref 8.6–10.2)
Chloride: 106 mmol/L (ref 96–106)
Creatinine, Ser: 1.86 mg/dL — ABNORMAL HIGH (ref 0.76–1.27)
Glucose: 105 mg/dL — ABNORMAL HIGH (ref 65–99)
Potassium: 4.3 mmol/L (ref 3.5–5.2)
Sodium: 143 mmol/L (ref 134–144)
eGFR: 38 mL/min/{1.73_m2} — ABNORMAL LOW (ref >59)

## 2021-03-12 MED ORDER — FUROSEMIDE 80 MG PO TABS: ORAL_TABLET | ORAL | 3 refills | Status: AC

## 2021-03-12 NOTE — Assessment & Plan Note (Signed)
History of CAD status post LAD PCI drug-eluting stenting by myself 07/31/2016 after being referred by Dr. Claudie Leach.  He did have a Myoview stress test prior to that that showed ischemia in the anterior wall.  Recent Myoview performed 10/30/2020 showed no evidence of ischemia.

## 2021-03-12 NOTE — Patient Instructions (Addendum)
Medication Instructions:   -Increase furosemide (lasix) 80mg  in the morning and 40mg  in the evening.   *If you need a refill on your cardiac medications before your next appointment, please call your pharmacy*   Lab Work: Your physician recommends that you have labs drawn today: BMET  Your physician recommends that you return for lab work in: 2 weeks for BMET   If you have labs (blood work) drawn today and your tests are completely normal, you will receive your results only by: [name] MyChart Message (if you have MyChart) OR . A paper copy in the mail If you have any lab test that is abnormal or we need to change your treatment, we will call you to review the results.   Testing/Procedures: Your physician has requested that you have an echocardiogram. Echocardiography is a painless test that uses sound waves to create images of your heart. It provides your doctor with information about the size and shape of your heart and how well your heart's chambers and valves are working. This procedure takes approximately one hour. There are no restrictions for this procedure. This procedure is done at [address]rd [address]. To be done in January 2023.     Follow-Up: At [facility], you and your health needs are our priority.  As part of our continuing mission to provide you with exceptional heart care, we have created designated Provider Care Teams.  These Care Teams include your primary Cardiologist (physician) and Advanced Practice Providers (APPs -  Physician Assistants and Nurse Practitioners) who all work together to provide you with the care you need, when you need it.  We recommend signing up for the patient portal called "MyChart".  Sign up information is provided on this After Visit Summary.  MyChart is used to connect with patients for Virtual Visits (Telemedicine).  Patients are able to view lab/test results, encounter notes, upcoming appointments, etc.  Non-urgent messages can be  sent to your provider as well.   To learn more about what you can do with MyChart, go to [email or web address].    Your next appointment:   4-6 week(s)  The format for your next appointment:   In Person  Provider:   You will see one of the following Advanced Practice Providers on your designated Care Team:    Sande Rives, PA-C  Coletta Memos, FNP  Then, Quay Burow, MD will plan to see you again in 6 month(s).

## 2021-03-12 NOTE — Progress Notes (Signed)
03/12/2021 Richard Vang   June 09, 1946  [id number]  Primary Physician Sandi Mariscal, MD Primary Cardiologist: Lorretta Harp MD [name], [place]  HPI:  Richard Vang is a 75 y.o.  moderate to severely overweight married Caucasian male father of 2 living children (1 deceased),grandfather of5 grandchildren referred by Dr. Sinclair Ship cardiac catheterization. He is retired working part-time at [employer] .I last saw him in the office  12/21/2020.  He is accompanied by his wife Richard Vang today.His risk factors include treated hypertension, diabetes and hyperlipidemia. His father did die of a myocardial infarction at age 50. He has never had a heart attack or stroke.He was having increasing dyspnea on exertion for the last 4-6 weekprior to seeing me. AMyoview stress test showed LV dysfunction with an EF of 40% and mild anterior ischemia.I performed cardiac catheterization on him 07/31/2016 revealing a high-grade proximal LAD stenosis which had stented using a drug-eluting stent. His symptoms ultimately improved although I never saw him back after that. He also has chronic atrial fibrillation on Coumadin anticoagulation.  He was admitted in August 6962 with diastolic heart failure was diuresed. He is now on daily Lasix and is watching his salt intake.   He saw Kerin Ransom in the office 11/08/2020 after he had a 2D echo that revealed preserved LV function and a Myoview stress test that showed no ischemia.  His Lasix was uptitrated and he was clinically improved.  Since I saw him in the office 3 months ago he has remained stable.  He continues to complain of chronic shortness of breath which I believe is related to diastolic dysfunction.  He denies chest pain.  His serum creatinine is in the 2 range on furosemide for diastolic dysfunction.   Current Meds  Medication Sig  . acyclovir (ZOVIRAX) 400 MG tablet Take 1 tablet by mouth twice daily  . amLODipine (NORVASC) 5 MG  tablet Take 5 mg by mouth daily.  [name] atorvastatin (LIPITOR) 40 MG tablet Take 1 tablet (40 mg total) by mouth at bedtime.  . carvedilol (COREG) 12.5 MG tablet Take 12.5 mg by mouth 2 (two) times daily with a meal.  . clopidogrel (PLAVIX) 75 MG tablet Take 1 tablet (75 mg total) by mouth daily with breakfast.  . cyclobenzaprine (FLEXERIL) 10 MG tablet Take 10 mg by mouth daily as needed for muscle spasms.   [name] escitalopram (LEXAPRO) 10 MG tablet Take 10 mg by mouth daily.  . furosemide (LASIX) 80 MG tablet Take 80 mg by mouth daily.  . ixazomib citrate (NINLARO) 3 MG capsule TAKE 1 CAPSULE (3 MG TOTAL) BY MOUTH ONCE A WEEK. ON DAY 1,8,15 EVERY 28 DAYS. TAKE ON AN EMPTY STOMACH 1HR BEFORE OR 2HRS AFTER FOOD. (Patient taking differently: TAKE 1 CAPSULE (3 MG TOTAL) BY MOUTH ONCE A WEEK. ON DAY 1,8,15 EVERY 28 DAYS. TAKE ON AN EMPTY STOMACH 1HR BEFORE OR 2HRS AFTER FOOD.)  . montelukast (SINGULAIR) 10 MG tablet Take 10 mg by mouth daily.  [name] omeprazole (PRILOSEC) 40 MG capsule Take 40 mg by mouth daily with breakfast.   . oxyCODONE-acetaminophen (PERCOCET) 10-325 MG tablet Take 1 tablet by mouth 2 (two) times daily.  . Potassium Chloride CR (MICRO-K) 8 MEQ CPCR capsule CR Take 8 mEq by mouth daily.  . SYMBICORT 160-4.5 MCG/ACT inhaler Inhale 2 puffs into the lungs 2 (two) times daily as needed (shortness of breath).   . Vitamin D, Ergocalciferol, (DRISDOL) 1.25 MG (50000 UNIT) CAPS capsule Take 1  capsule by mouth once a week  . warfarin (COUMADIN) 5 MG tablet Take 5 mg by mouth daily.     No Known Allergies  Social History   Socioeconomic History  . Marital status: Divorced    Spouse name: Not on file  . Number of children: Not on file  . Years of education: Not on file  . Highest education level: Not on file  Occupational History  . Not on file  Tobacco Use  . Smoking status: Never Smoker  . Smokeless tobacco: Never Used  Vaping Use  . Vaping Use: Never used  Substance and Sexual Activity   . Alcohol use: Not Currently    Comment: 07/31/2016 "nothing since 2002"  . Drug use: No  . Sexual activity: Not Currently  Other Topics Concern  . Not on file  Social History Narrative  . Not on file   Social Determinants of Health   Financial Resource Strain: Low Risk   . Difficulty of Paying Living Expenses: Not hard at all  Food Insecurity: No Food Insecurity  . Worried About Charity fundraiser in the Last Year: Never true  . Ran Out of Food in the Last Year: Never true  Transportation Needs: No Transportation Needs  . Lack of Transportation (Medical): No  . Lack of Transportation (Non-Medical): No  Physical Activity: Inactive  . Days of Exercise per Week: 0 days  . Minutes of Exercise per Session: 0 min  Stress: Not on file  Social Connections: Moderately Integrated  . Frequency of Communication with Friends and Family: More than three times a week  . Frequency of Social Gatherings with Friends and Family: More than three times a week  . Attends Religious Services: Never  . Active Member of Clubs or Organizations: Yes  . Attends Archivist Meetings: Never  . Marital Status: Living with partner  Intimate Partner Violence: Not At Risk  . Fear of Current or Ex-Partner: No  . Emotionally Abused: No  . Physically Abused: No  . Sexually Abused: No     Review of Systems: General: negative for chills, fever, night sweats or weight changes.  Cardiovascular: negative for chest pain, dyspnea on exertion, edema, orthopnea, palpitations, paroxysmal nocturnal dyspnea or shortness of breath Dermatological: negative for rash Respiratory: negative for cough or wheezing Urologic: negative for hematuria Abdominal: negative for nausea, vomiting, diarrhea, bright red blood per rectum, melena, or hematemesis Neurologic: negative for visual changes, syncope, or dizziness All other systems reviewed and are otherwise negative except as noted above.    Blood pressure 136/74,  pulse (!) 54, height 5\' 9"  (1.753 m), weight 225 lb (102.1 kg).  General appearance: alert and no distress Neck: no adenopathy, no carotid bruit, no JVD, supple, symmetrical, trachea midline and thyroid not enlarged, symmetric, no tenderness/mass/nodules Lungs: clear to auscultation bilaterally Heart: irregularly irregular rhythm Extremities: extremities normal, atraumatic, no cyanosis or edema Pulses: 2+ and symmetric Skin: Skin color, texture, turgor normal. No rashes or lesions Neurologic: Alert and oriented X 3, normal strength and tone. Normal symmetric reflexes. Normal coordination and gait  EKG atrial fibrillation with ventricular sponsor 54, septal Q waves with low limb voltage.  I personally reviewed this EKG.  ASSESSMENT AND PLAN:   Essential hypertension History of essential hypertension blood pressure measured today 136/74.  He is on amlodipine and carvedilol.  Chronic atrial fibrillation History of chronic A. fib rate controlled on warfarin oral anticoagulation.  Dyspnea on exertion History of dyspnea on exertion most likely  related to diastolic heart failure.  He is on furosemide 80 mg a day.  He has no history of prior tobacco abuse.  He has chronic shortness of breath but on exam has no edema.  Serum creatinine is in the 2 range.  I am going to increase his furosemide to 80 mg in the morning and 40 mg in the evening.  We will check a basic metabolic panel today and again in 2 weeks.  CAD S/P percutaneous coronary angioplasty History of CAD status post LAD PCI drug-eluting stenting by myself 07/31/2016 after being referred by Dr. Claudie Leach.  He did have a Myoview stress test prior to that that showed ischemia in the anterior wall.  Recent Myoview performed 10/30/2020 showed no evidence of ischemia.  Dyslipidemia, goal LDL below 70 History of hyperlipidemia on statin therapy with lipid profile performed 08/23/2019 revealing a total cholesterol of 108, LDL of 37 and HDL  46.  Thoracic aortic aneurysm [facility]) History of thoracic aortic aneurysm measuring 44 mm by 2D echo 10/24/2020.  We will recheck this in January 2023.      Lorretta Harp MD FACP,FACC,FAHA, [facility] 03/12/2021 8:21 AM

## 2021-03-12 NOTE — Assessment & Plan Note (Signed)
History of chronic A. fib rate controlled on warfarin oral anticoagulation.

## 2021-03-12 NOTE — Assessment & Plan Note (Signed)
History of dyspnea on exertion most likely related to diastolic heart failure.  He is on furosemide 80 mg a day.  He has no history of prior tobacco abuse.  He has chronic shortness of breath but on exam has no edema.  Serum creatinine is in the 2 range.  I am going to increase his furosemide to 80 mg in the morning and 40 mg in the evening.  We will check a basic metabolic panel today and again in 2 weeks.

## 2021-03-12 NOTE — Assessment & Plan Note (Signed)
History of hyperlipidemia on statin therapy with lipid profile performed 08/23/2019 revealing a total cholesterol of 108, LDL of 37 and HDL 46.

## 2021-03-12 NOTE — Assessment & Plan Note (Signed)
History of essential hypertension blood pressure measured today 136/74.  He is on amlodipine and carvedilol.

## 2021-03-12 NOTE — Assessment & Plan Note (Signed)
History of thoracic aortic aneurysm measuring 44 mm by 2D echo 10/24/2020.  We will recheck this in January 2023.

## 2021-03-19 ENCOUNTER — Other Ambulatory Visit (HOSPITAL_COMMUNITY): Payer: Self-pay

## 2021-03-24 NOTE — Progress Notes (Signed)
  Radiation Oncology         [phone] ________________________________  Name: Richard Vang MRN: [id number]  Date of Service: 03/11/2021  DOB: 1946/02/10  Post Treatment Telephone Note  Diagnosis:   Multiple myeloma not having achieved remission  Interval Since Last Radiation:  9 weeks   12/25/2020 through 01/07/2021 Site Technique Total Dose (Gy) Dose per Fx (Gy) Completed Fx Beam Energies  Pelvis: Pelvis 3D 25/25 2.5 10/10 10X, 15X     Narrative:  The patient was contacted today for routine follow-up. During treatment he did very well with radiotherapy and did not have significant desquamation. I was unable to reach the patient by phone today.  Impression/Plan: 1. Multiple myeloma not having achieved remission. I was unable to reach the patient by phone and voicemail was not set up. However we would be happy to continue to follow him as needed, but he will also continue to follow up with Dr. Irene Limbo in medical oncology.      Carola Rhine, PAC

## 2021-03-25 ENCOUNTER — Ambulatory Visit: Payer: Medicare HMO

## 2021-03-25 ENCOUNTER — Other Ambulatory Visit: Payer: Medicare HMO

## 2021-03-25 ENCOUNTER — Ambulatory Visit: Payer: Medicare HMO | Admitting: Hematology

## 2021-04-08 NOTE — Progress Notes (Signed)
Cardiology Clinic Note   Patient Name: Richard Vang Date of Encounter: 04/09/2021  Primary Care Provider:  Sandi Mariscal, MD Primary Cardiologist:  Quay Burow, MD  Patient Profile    Richard Vang 75 year old male presents the clinic today for follow-up evaluation of his essential hypertension, chronic atrial fibrillation, and chronic diastolic CHF.  Past Medical History    Past Medical History:  Diagnosis Date   Arthritis    "left shoulder" (07/31/2016)   Coronary artery disease    GERD (gastroesophageal reflux disease)    Heart murmur    High cholesterol    Hypertension    Multiple myeloma ([place])    Sleep apnea    "probably; having test in November" (07/31/2016)   Type II diabetes mellitus ([place])    Past Surgical History:  Procedure Laterality Date   CARDIAC CATHETERIZATION N/A 07/31/2016   Procedure: Left Heart Cath and Coronary Angiography;  Surgeon: Lorretta Harp, MD;  Location: [place] [facility];  Service: Cardiovascular;  Laterality: N/A;   CARDIAC CATHETERIZATION N/A 07/31/2016   Procedure: Coronary Stent Intervention;  Surgeon: Lorretta Harp, MD;  Location: [place] [facility];  Service: Cardiovascular;  Laterality: N/A;   CORONARY ANGIOPLASTY     OPEN REDUCTION INTERNAL FIXATION (ORIF) METACARPAL Left 05/21/2018   Procedure: OPEN REDUCTION INTERNAL FIXATION (ORIF) 2ND METACARPAL;  Surgeon: Shona Needles, MD;  Location: [place];  Service: Orthopedics;  Laterality: Left;   ORIF CLAVICULAR FRACTURE Right 05/21/2018   Procedure: OPEN REDUCTION INTERNAL FIXATION (ORIF) CLAVICULAR FRACTURE;  Surgeon: Shona Needles, MD;  Location: [place];  Service: Orthopedics;  Laterality: Right;   SHOULDER SURGERY Left 1973   "put pin in it where it had separated"    TONSILLECTOMY  ~ 1956    Allergies  No Known Allergies  History of Present Illness    Richard Vang has a PMH of essential hypertension, chronic atrial fibrillation, chronic diastolic CHF, thoracic aortic  aneurysm, type 2 diabetes, CKD stage III, HLD, anemia, and coronary artery disease.  He underwent a nuclear stress test that showed LV dysfunction with an EF of 40% and mild anterior ischemia.  He underwent cardiac catheterization by Dr. Gwenlyn Found on 10/17 which showed proximal LAD stenosis.  He received DES x1.  He was admitted to the hospital 1/74 with diastolic CHF and was diuresed.  He followed up with Kerin Ransom, PA-C on 1/22.  His echocardiogram showed preserved LVEF.  A nuclear stress test at that time showed no ischemia.  His furosemide was uptitrated and he clinically improved.  He was last seen by Dr. Gwenlyn Found on 03/12/2021.  During that time he remained stable.  He complained of chronic shortness of breath which was felt to be related to his diastolic dysfunction.  He denied chest pain.  His serum creatinine remained stable in the 2 range.  He presents to the clinic today for follow-up evaluation states he feels well.  His breathing has improved.  He is limited in his physical activity due to his back pain from cancer.  He remains very sedentary and reports that he stays in bed most of the time.  His breathing is much better.  We discussed that his dyspnea is in part due to his severely decreased fitness level.  I have asked him to increase his physical activity as tolerated.  We discussed using lower extremity support stockings and I will give him the [place] support stocking sheet.  We will have him maintain his  low-sodium diet.  Weight reviewed his thoracic aortic aneurysm and diastolic heart failure.  We will repeat his echocardiogram 1/23 and have him follow-up after his echocardiogram.  Today he denies chest pain, shortness of breath, lower extremity edema, fatigue, palpitations, melena, hematuria, hemoptysis, diaphoresis, weakness, presyncope, syncope, orthopnea, and PND.   Home Medications    Prior to Admission medications   Medication Sig Start Date End Date Taking? Authorizing Provider   acyclovir (ZOVIRAX) 400 MG tablet Take 1 tablet by mouth twice daily 03/12/21   Brunetta Genera, MD  amLODipine (NORVASC) 5 MG tablet Take 5 mg by mouth daily.    [provider]  atorvastatin (LIPITOR) 40 MG tablet Take 1 tablet (40 mg total) by mouth at bedtime. 08/01/16   Arbutus Leas, NP  carvedilol (COREG) 12.5 MG tablet Take 12.5 mg by mouth 2 (two) times daily with a meal.    [provider]  clopidogrel (PLAVIX) 75 MG tablet Take 1 tablet (75 mg total) by mouth daily with breakfast. 08/02/16   Arbutus Leas, NP  cyclobenzaprine (FLEXERIL) 10 MG tablet Take 10 mg by mouth daily as needed for muscle spasms.  03/24/18   [provider]  escitalopram (LEXAPRO) 10 MG tablet Take 10 mg by mouth daily. 03/25/19   [provider]  furosemide (LASIX) 80 MG tablet Take 1 tablet (80 mg total) by mouth in the morning AND 0.5 tablets (40 mg total) every evening. 03/12/21   Lorretta Harp, MD  isosorbide mononitrate (IMDUR) 30 MG 24 hr tablet Take 1 tablet (30 mg total) by mouth daily. 09/28/20 12/27/20  Erlene Quan, PA-C  ixazomib citrate (NINLARO) 3 MG capsule TAKE 1 CAPSULE (3 MG TOTAL) BY MOUTH ONCE A WEEK. ON DAY 1,8,15 EVERY 28 DAYS. TAKE ON AN EMPTY STOMACH 1HR BEFORE OR 2HRS AFTER FOOD. Patient taking differently: TAKE 1 CAPSULE (3 MG TOTAL) BY MOUTH ONCE A WEEK. ON DAY 1,8,15 EVERY 28 DAYS. TAKE ON AN EMPTY STOMACH 1HR BEFORE OR 2HRS AFTER FOOD. 02/12/21 02/12/22  Brunetta Genera, MD  montelukast (SINGULAIR) 10 MG tablet Take 10 mg by mouth daily. 02/23/19   [provider]  omeprazole (PRILOSEC) 40 MG capsule Take 40 mg by mouth daily with breakfast.     [provider]  oxyCODONE-acetaminophen (PERCOCET) 10-325 MG tablet Take 1 tablet by mouth 2 (two) times daily. 05/26/18   Reyne Dumas, MD  Potassium Chloride CR (MICRO-K) 8 MEQ CPCR capsule CR Take 8 mEq by mouth daily.    [provider]  SYMBICORT 160-4.5 MCG/ACT inhaler  Inhale 2 puffs into the lungs 2 (two) times daily as needed (shortness of breath).  04/29/19   [provider]  Vitamin D, Ergocalciferol, (DRISDOL) 1.25 MG (50000 UNIT) CAPS capsule Take 1 capsule by mouth once a week 11/28/19   Brunetta Genera, MD  warfarin (COUMADIN) 5 MG tablet Take 5 mg by mouth daily. 05/05/19   [provider]    Family History    Family History  Problem Relation Age of Onset   Hypertension Other    Lung cancer Mother    He indicated that the status of his mother is unknown. He indicated that the status of his other is unknown.  Social History    Social History   Socioeconomic History   Marital status: Divorced    Spouse name: Not on file   Number of children: Not on file   Years of education: Not on file  Highest education level: Not on file  Occupational History   Not on file  Tobacco Use   Smoking status: Never   Smokeless tobacco: Never  Vaping Use   Vaping Use: Never used  Substance and Sexual Activity   Alcohol use: Not Currently    Comment: 07/31/2016 "nothing since 2002"   Drug use: No   Sexual activity: Not Currently  Other Topics Concern   Not on file  Social History Narrative   Not on file   Social Determinants of Health   Financial Resource Strain: Low Risk    Difficulty of Paying Living Expenses: Not hard at all  Food Insecurity: No Food Insecurity   Worried About Charity fundraiser in the Last Year: Never true   [place] in the Last Year: Never true  Transportation Needs: No Transportation Needs   Lack of Transportation (Medical): No   Lack of Transportation (Non-Medical): No  Physical Activity: Inactive   Days of Exercise per Week: 0 days   Minutes of Exercise per Session: 0 min  Stress: Not on file  Social Connections: Moderately Integrated   Frequency of Communication with Friends and Family: More than three times a week   Frequency of Social Gatherings with Friends and Family: More than  three times a week   Attends Religious Services: Never   Marine scientist or Organizations: Yes   Attends Archivist Meetings: Never   Marital Status: Living with partner  Intimate Partner Violence: Not At Risk   Fear of Current or Ex-Partner: No   Emotionally Abused: No   Physically Abused: No   Sexually Abused: No     Review of Systems    General:  No chills, fever, night sweats or weight changes.  Cardiovascular:  No chest pain, dyspnea on exertion, edema, orthopnea, palpitations, paroxysmal nocturnal dyspnea. Dermatological: No rash, lesions/masses Respiratory: No cough, dyspnea Urologic: No hematuria, dysuria Abdominal:   No nausea, vomiting, diarrhea, bright red blood per rectum, melena, or hematemesis Neurologic:  No visual changes, wkns, changes in mental status. All other systems reviewed and are otherwise negative except as noted above.  Physical Exam    VS:  BP 130/80 (BP Location: Left Arm)   Pulse 67   Ht '5\' 9"'  (1.753 m)   Wt 224 lb 3.2 oz (101.7 kg)   SpO2 97%   BMI 33.11 kg/m  , BMI Body mass index is 33.11 kg/m. GEN: Well nourished, well developed, in no acute distress. HEENT: normal. Neck: Supple, no JVD, carotid bruits, or masses. Cardiac: RRR, no murmurs, rubs, or gallops. No clubbing, cyanosis, edema.  Radials/DP/PT 2+ and equal bilaterally.  Respiratory:  Respirations regular and unlabored, clear to auscultation bilaterally. GI: Soft, nontender, nondistended, BS + x 4. MS: no deformity or atrophy. Skin: warm and dry, no rash. Neuro:  Strength and sensation are intact. Psych: Normal affect.  Accessory Clinical Findings    Recent Labs: 01/29/2021: ALT 11; Hemoglobin 11.0; Platelets 92 03/12/2021: BUN 39; Creatinine, Ser 1.86; Potassium 4.3; Sodium 143   Recent Lipid Panel    Component Value Date/Time   CHOL 100 08/23/2019 0853   TRIG 82 08/23/2019 0853   HDL 46 08/23/2019 0853   CHOLHDL 2.2 08/23/2019 0853   LDLCALC 37  08/23/2019 0853    ECG personally reviewed by me today-none today.  Cardiac catheterization 07/31/2016 Prox LAD lesion, 80 %stenosed. Post intervention, there is a 0% residual stenosis. A stent was successfully placed. There is moderate  to severe left ventricular systolic dysfunction. LV end diastolic pressure is moderately elevated. The left ventricular ejection fraction is 25-35% by visual estimate.   Richard Vang is a 75 y.o. male      [id number] LOCATION:  FACILITY: [facility] PHYSICIAN: Quay Burow, M.D. [birth year]  Echocardiogram 10/24/2020 IMPRESSIONS     1. Left ventricular ejection fraction, by estimation, is 50 to 55%. The  left ventricle has low normal function. The left ventricle has no regional  wall motion abnormalities. There is mild left ventricular hypertrophy.  Left ventricular diastolic  parameters are consistent with Grade II diastolic dysfunction  (pseudonormalization).   2. Right ventricular systolic function is normal. The right ventricular  size is moderately enlarged. There is moderately elevated pulmonary artery  systolic pressure. The estimated right ventricular systolic pressure is  19.4 mmHg.   3. Left atrial size was severely dilated.   4. Right atrial size was severely dilated.   5. The mitral valve is normal in structure. No evidence of mitral valve  regurgitation. No evidence of mitral stenosis.   6. Tricuspid valve regurgitation is moderate.   7. The aortic valve is calcified. Aortic valve regurgitation is mild.  Mild to moderate aortic valve sclerosis/calcification is present, without  any evidence of aortic stenosis. Aortic regurgitation PHT measures 731  msec.   8. Pulmonic valve regurgitation is moderate.   9. Aortic dilatation noted. There is mild dilatation at the level of the  sinuses of Valsalva, measuring 44 mm. There is borderline dilatation of  the ascending aorta, measuring 37 mm.  10. The inferior vena cava is dilated in size  with >50% respiratory  variability, suggesting right atrial pressure of 8 mmHg.   Nuclear stress test 10/30/2020 There was no ST segment deviation noted during stress. Defect 1: There is a small defect of mild severity present in the apex location. This is a low risk study.   Unable to assess wall motion/EF as study not gated due to atrial fibrillation. Small, mild fixed defect at apex, noted on prior report. No evidence of ischemia.  Assessment & Plan   1.  DOE-no increased work of breathing or activity intolerance.  Reports breathing much better since furosemide increased. Continue furosemide Heart healthy low-sodium diet-salty 6 given Increase physical activity as tolerated Repeat BMP [place] support stocking sheet given  Essential hypertension-BP today 130/80.  Well-controlled at home. Continue amlodipine, carvedilol, Imdur Heart healthy low-sodium diet-salty 6 given Increase physical activity as tolerated  Chronic atrial fibrillation-heart rate today 67.  Rate controlled. Continue carvedilol, warfarin Heart healthy low-sodium diet-salty 6 given Increase physical activity as tolerated Avoid triggers caffeine, chocolate, EtOH, dehydration etc.  Coronary artery disease-no chest pain today.  Underwent PCI with DES to his LAD 10/17.  Nuclear stress test 1/22 showed no ischemia. Continue continue carvedilol, amlodipine, atorvastatin, clopidogrel, Imdur Heart healthy low-sodium diet-salty 6 given Increase physical activity as tolerated  Hyperlipidemia-LDL 37 on 08/2019 Continue atorvastatin Heart healthy low-sodium high-fiber diet Increase physical activity as tolerated  Thoracic aortic aneurysm-denies recent episodes of chest or back pain.  Echo 10/24/2020 showed 44 mm aortic aneurysm. Repeat echocardiogram 1/23  Disposition: Follow-up with Dr. Gwenlyn Found or me in  6 months.  Jossie Ng. Kyleigha Markert NP-C    04/09/2021, 10:20 AM [place]  [address] Office [phone] Fax [phone]  Notice: This dictation was prepared with Dragon dictation along with smaller phrase technology. Any transcriptional errors that result from this process are unintentional and may  not be corrected upon review.  I spent 15 minutes examining this patient, reviewing medications, and using patient centered shared decision making involving her cardiac care.  Prior to her visit I spent greater than 20 minutes reviewing her past medical history,  medications, and prior cardiac tests.

## 2021-04-09 ENCOUNTER — Other Ambulatory Visit: Payer: Self-pay

## 2021-04-09 ENCOUNTER — Encounter: Payer: Self-pay | Admitting: General Practice

## 2021-04-09 ENCOUNTER — Other Ambulatory Visit: Payer: Self-pay | Admitting: Hematology

## 2021-04-09 ENCOUNTER — Ambulatory Visit (INDEPENDENT_AMBULATORY_CARE_PROVIDER_SITE_OTHER): Payer: Medicare HMO | Admitting: General Practice

## 2021-04-09 ENCOUNTER — Other Ambulatory Visit (HOSPITAL_COMMUNITY): Payer: Self-pay

## 2021-04-09 VITALS — BP 130/80 | HR 67 | Ht 69.0 in | Wt 224.2 lb

## 2021-04-09 DIAGNOSIS — I482 Chronic atrial fibrillation, unspecified: Secondary | ICD-10-CM | POA: Diagnosis not present

## 2021-04-09 DIAGNOSIS — I712 Thoracic aortic aneurysm, without rupture, unspecified: Secondary | ICD-10-CM

## 2021-04-09 DIAGNOSIS — Z79899 Other long term (current) drug therapy: Secondary | ICD-10-CM

## 2021-04-09 DIAGNOSIS — R0600 Dyspnea, unspecified: Secondary | ICD-10-CM

## 2021-04-09 DIAGNOSIS — I2510 Atherosclerotic heart disease of native coronary artery without angina pectoris: Secondary | ICD-10-CM

## 2021-04-09 DIAGNOSIS — Z9861 Coronary angioplasty status: Secondary | ICD-10-CM

## 2021-04-09 DIAGNOSIS — I10 Essential (primary) hypertension: Secondary | ICD-10-CM

## 2021-04-09 DIAGNOSIS — C9000 Multiple myeloma not having achieved remission: Secondary | ICD-10-CM

## 2021-04-09 DIAGNOSIS — R0609 Other forms of dyspnea: Secondary | ICD-10-CM

## 2021-04-09 DIAGNOSIS — E785 Hyperlipidemia, unspecified: Secondary | ICD-10-CM

## 2021-04-09 LAB — BASIC METABOLIC PANEL
BUN/Creatinine Ratio: 20 (ref 10–24)
BUN: 35 mg/dL — ABNORMAL HIGH (ref 8–27)
CO2: 22 mmol/L (ref 20–29)
Calcium: 9.3 mg/dL (ref 8.6–10.2)
Chloride: 108 mmol/L — ABNORMAL HIGH (ref 96–106)
Creatinine, Ser: 1.76 mg/dL — ABNORMAL HIGH (ref 0.76–1.27)
Glucose: 107 mg/dL — ABNORMAL HIGH (ref 65–99)
Potassium: 4.2 mmol/L (ref 3.5–5.2)
Sodium: 145 mmol/L — ABNORMAL HIGH (ref 134–144)
eGFR: 40 mL/min/{1.73_m2} — ABNORMAL LOW (ref >59)

## 2021-04-09 MED ORDER — IXAZOMIB CITRATE 3 MG PO CAPS
ORAL_CAPSULE | ORAL | 1 refills | Status: DC
  Filled 2021-04-09: qty 3, 28d supply, fill #0
  Filled 2021-05-07: qty 3, 28d supply, fill #1

## 2021-04-09 NOTE — Addendum Note (Signed)
Addended by: Waylan Rocher on: 04/09/2021 04:33 PM   Modules accepted: Orders

## 2021-04-09 NOTE — Patient Instructions (Signed)
Medication Instructions:  The current medical regimen is effective;  continue present plan and medications as directed. Please refer to the Current Medication list given to you today.  *If you need a refill on your cardiac medications before your next appointment, please call your pharmacy*  Lab Work: BMET TODAY If you have labs (blood work) drawn today and your tests are completely normal, you will receive your results only by:  [place] (if you have MyChart) OR A paper copy in the mail.  If you have any lab test that is abnormal or we need to change your treatment, we will call you to review the results. You may go to any [facility] that is convenient for you however, we do have a lab in our office that is able to assist you. You DO NOT need an appointment for our lab. The lab is open 8:00am and closes at 4:00pm. Lunch 12:45 - 1:45pm.  Testing/Procedures: Echocardiogram(IN January 2023) - Your physician has requested that you have an echocardiogram. Echocardiography is a painless test that uses sound waves to create images of your heart. It provides your doctor with information about the size and shape of your heart and how well your heart's chambers and valves are working. This procedure takes approximately one hour. There are no restrictions for this procedure. This will be performed at our [facility] location - [address].   Special Instructions PLEASE READ AND FOLLOW SALTY 6-ATTACHED-1,800 mg daily  PLEASE INCREASE PHYSICAL ACTIVITY AS TOLERATED  PLEASE PURCHASE AND WEAR COMPRESSION STOCKINGS DAILY AND TAKE OFF AT BEDTIME. Compression stockings are elastic socks that squeeze the legs. They help to increase blood flow to the legs and to decrease swelling in the legs from fluid retention, and reduce the chance of developing blood clots in the lower legs. Please put on in the AM when dressing and off at night when dressing for bed.  LET THEM KNOW THAT YOU NEED KNEE HIGH'S WITH  COMPRESSION OF 15-20 mmhg.  [employer]  [place] ([phone]); [place], [place] [phone]; [phone]  EMAIL   [email or web address].  PLEASE MAKE SURE TO ELEVATE YOUR FEET & LEGS WHILE SITTING, THIS WILL HELP WITH THE SWELLING ALSO.   Follow-Up: Your next appointment:  6 month(s) In Person with Quay Burow, MD ONLY  At [facility], you and your health needs are our priority.  As part of our continuing mission to provide you with exceptional heart care, we have created designated Provider Care Teams.  These Care Teams include your primary Cardiologist (physician) and Advanced Practice Providers (APPs -  Physician Assistants and Nurse Practitioners) who all work together to provide you with the care you need, when you need it.

## 2021-04-11 ENCOUNTER — Other Ambulatory Visit (HOSPITAL_COMMUNITY): Payer: Self-pay

## 2021-04-15 ENCOUNTER — Inpatient Hospital Stay: Payer: Medicare HMO

## 2021-04-15 ENCOUNTER — Other Ambulatory Visit: Payer: Self-pay

## 2021-04-15 ENCOUNTER — Inpatient Hospital Stay (HOSPITAL_BASED_OUTPATIENT_CLINIC_OR_DEPARTMENT_OTHER): Payer: Medicare HMO | Admitting: Physician Assistant

## 2021-04-15 ENCOUNTER — Inpatient Hospital Stay: Payer: Medicare HMO | Attending: Hematology

## 2021-04-15 VITALS — BP 116/69 | HR 61 | Temp 97.8°F | Resp 19 | Ht 69.0 in | Wt 224.6 lb

## 2021-04-15 DIAGNOSIS — E538 Deficiency of other specified B group vitamins: Secondary | ICD-10-CM | POA: Insufficient documentation

## 2021-04-15 DIAGNOSIS — I4891 Unspecified atrial fibrillation: Secondary | ICD-10-CM | POA: Diagnosis not present

## 2021-04-15 DIAGNOSIS — N400 Enlarged prostate without lower urinary tract symptoms: Secondary | ICD-10-CM | POA: Insufficient documentation

## 2021-04-15 DIAGNOSIS — Z7189 Other specified counseling: Secondary | ICD-10-CM

## 2021-04-15 DIAGNOSIS — Z7951 Long term (current) use of inhaled steroids: Secondary | ICD-10-CM | POA: Diagnosis not present

## 2021-04-15 DIAGNOSIS — Z79899 Other long term (current) drug therapy: Secondary | ICD-10-CM | POA: Insufficient documentation

## 2021-04-15 DIAGNOSIS — D6959 Other secondary thrombocytopenia: Secondary | ICD-10-CM | POA: Diagnosis not present

## 2021-04-15 DIAGNOSIS — C9000 Multiple myeloma not having achieved remission: Secondary | ICD-10-CM

## 2021-04-15 DIAGNOSIS — Z7901 Long term (current) use of anticoagulants: Secondary | ICD-10-CM | POA: Insufficient documentation

## 2021-04-15 DIAGNOSIS — I10 Essential (primary) hypertension: Secondary | ICD-10-CM | POA: Insufficient documentation

## 2021-04-15 DIAGNOSIS — D649 Anemia, unspecified: Secondary | ICD-10-CM | POA: Diagnosis not present

## 2021-04-15 DIAGNOSIS — E119 Type 2 diabetes mellitus without complications: Secondary | ICD-10-CM | POA: Insufficient documentation

## 2021-04-15 LAB — CMP (CANCER CENTER ONLY)
ALT: 14 U/L (ref 0–44)
AST: 23 U/L (ref 15–41)
Albumin: 3.6 g/dL (ref 3.5–5.0)
Alkaline Phosphatase: 76 U/L (ref 38–126)
Anion gap: 12 (ref 5–15)
BUN: 34 mg/dL — ABNORMAL HIGH (ref 8–23)
CO2: 24 mmol/L (ref 22–32)
Calcium: 9.1 mg/dL (ref 8.9–10.3)
Chloride: 108 mmol/L (ref 98–111)
Creatinine: 1.82 mg/dL — ABNORMAL HIGH (ref 0.61–1.24)
GFR, Estimated: 39 mL/min — ABNORMAL LOW (ref >60)
Glucose, Bld: 107 mg/dL — ABNORMAL HIGH (ref 70–99)
Potassium: 4 mmol/L (ref 3.5–5.1)
Sodium: 144 mmol/L (ref 135–145)
Total Bilirubin: 1 mg/dL (ref 0.3–1.2)
Total Protein: 6.7 g/dL (ref 6.5–8.1)

## 2021-04-15 LAB — CBC WITH DIFFERENTIAL (CANCER CENTER ONLY)
Abs Immature Granulocytes: 0.02 10*3/uL (ref 0.00–0.07)
Basophils Absolute: 0 10*3/uL (ref 0.0–0.1)
Basophils Relative: 0 %
Eosinophils Absolute: 0.1 10*3/uL (ref 0.0–0.5)
Eosinophils Relative: 2 %
HCT: 33.2 % — ABNORMAL LOW (ref 39.0–52.0)
Hemoglobin: 10.4 g/dL — ABNORMAL LOW (ref 13.0–17.0)
Immature Granulocytes: 1 %
Lymphocytes Relative: 12 %
Lymphs Abs: 0.4 10*3/uL — ABNORMAL LOW (ref 0.7–4.0)
MCH: 29.6 pg (ref 26.0–34.0)
MCHC: 31.3 g/dL (ref 30.0–36.0)
MCV: 94.6 fL (ref 80.0–100.0)
Monocytes Absolute: 0.4 10*3/uL (ref 0.1–1.0)
Monocytes Relative: 11 %
Neutro Abs: 2.7 10*3/uL (ref 1.7–7.7)
Neutrophils Relative %: 74 %
Platelet Count: 100 10*3/uL — ABNORMAL LOW (ref 150–400)
RBC: 3.51 MIL/uL — ABNORMAL LOW (ref 4.22–5.81)
RDW: 16.6 % — ABNORMAL HIGH (ref 11.5–15.5)
WBC Count: 3.6 10*3/uL — ABNORMAL LOW (ref 4.0–10.5)
nRBC: 0 % (ref 0.0–0.2)

## 2021-04-15 MED ORDER — SODIUM CHLORIDE 0.9 % IV SOLN
60.0000 mg | Freq: Once | INTRAVENOUS | Status: AC
  Administered 2021-04-15: 60 mg via INTRAVENOUS
  Filled 2021-04-15: qty 10

## 2021-04-15 MED ORDER — SODIUM CHLORIDE 0.9 % IV SOLN
INTRAVENOUS | Status: DC
  Filled 2021-04-15: qty 250

## 2021-04-15 NOTE — Progress Notes (Signed)
HEMATOLOGY/ONCOLOGY CLINIC NOTE  Date of Service: 04/15/21    Patient Care Team: Sandi Mariscal, MD as PCP - General (Internal Medicine) Lorretta Harp, MD as PCP - Cardiology (Cardiology)  CHIEF COMPLAINT: Sunday Corn chain (kappa) Multiple Myeloma  INTERVAL HISTORY: Richard Vang returns today for a follow up for multiple myeloma prior to Aredia infusion. Patient is accompanied by his wife for this visit. The patient's last visit with [name] was on 01/29/2021. Mr. Swigert reports that his energy levels are overall stable. He reports having fatigue that worsens intermittently which requires frequent resting. He is able to complete his ADLs on his own. He has a good appetite without any noticeable weight changes. Patient denies any nausea, vomiting or abdominal pain. He has intermittent episodes of diarrhea that resolves on its own. He does not take any antidiarrheals at this time. Patient denies easy bruising or signs of bleeding. He continues to have ongoing low back pain. The back pain is currently managed with Percocet 10-325 mg that he takes up to 4 times per day. Patient has chronic shortness of breath with exertion, but none at rest.  He denies any fevers, chills, night sweats, chest pain or cough. He has no other complaints. Rest of the 10 point ROS is below.   MEDICAL HISTORY:  Past Medical History:  Diagnosis Date   Arthritis    "left shoulder" (07/31/2016)   Coronary artery disease    GERD (gastroesophageal reflux disease)    Heart murmur    High cholesterol    Hypertension    Multiple myeloma ([place])    Sleep apnea    "probably; having test in November" (07/31/2016)   Type II diabetes mellitus ([place])     SURGICAL HISTORY: Past Surgical History:  Procedure Laterality Date   CARDIAC CATHETERIZATION N/A 07/31/2016   Procedure: Left Heart Cath and Coronary Angiography;  Surgeon: Lorretta Harp, MD;  Location: [place] [facility];  Service: Cardiovascular;  Laterality: N/A;   CARDIAC  CATHETERIZATION N/A 07/31/2016   Procedure: Coronary Stent Intervention;  Surgeon: Lorretta Harp, MD;  Location: [place] [facility];  Service: Cardiovascular;  Laterality: N/A;   CORONARY ANGIOPLASTY     OPEN REDUCTION INTERNAL FIXATION (ORIF) METACARPAL Left 05/21/2018   Procedure: OPEN REDUCTION INTERNAL FIXATION (ORIF) 2ND METACARPAL;  Surgeon: Shona Needles, MD;  Location: [facility];  Service: Orthopedics;  Laterality: Left;   ORIF CLAVICULAR FRACTURE Right 05/21/2018   Procedure: OPEN REDUCTION INTERNAL FIXATION (ORIF) CLAVICULAR FRACTURE;  Surgeon: Shona Needles, MD;  Location: [place];  Service: Orthopedics;  Laterality: Right;   SHOULDER SURGERY Left 1973   "put pin in it where it had separated"    TONSILLECTOMY  ~ 1956    SOCIAL HISTORY: Social History   Socioeconomic History   Marital status: Divorced    Spouse name: Not on file   Number of children: Not on file   Years of education: Not on file   Highest education level: Not on file  Occupational History   Not on file  Tobacco Use   Smoking status: Never   Smokeless tobacco: Never  Vaping Use   Vaping Use: Never used  Substance and Sexual Activity   Alcohol use: Not Currently    Comment: 07/31/2016 "nothing since 2002"   Drug use: No   Sexual activity: Not Currently  Other Topics Concern   Not on file  Social History Narrative   Not on file   Social Determinants of  Health   Financial Resource Strain: Low Risk    Difficulty of Paying Living Expenses: Not hard at all  Food Insecurity: No Food Insecurity   Worried About [place] in the Last Year: Never true   Ran Out of Food in the Last Year: Never true  Transportation Needs: No Transportation Needs   Lack of Transportation (Medical): No   Lack of Transportation (Non-Medical): No  Physical Activity: Inactive   Days of Exercise per Week: 0 days   Minutes of Exercise per Session: 0 min  Stress: Not on file  Social Connections: Moderately Integrated    Frequency of Communication with Friends and Family: More than three times a week   Frequency of Social Gatherings with Friends and Family: More than three times a week   Attends Religious Services: Never   Marine scientist or Organizations: Yes   Attends Archivist Meetings: Never   Marital Status: Living with partner  Intimate Partner Violence: Not At Risk   Fear of Current or Ex-Partner: No   Emotionally Abused: No   Physically Abused: No   Sexually Abused: No    FAMILY HISTORY: Family History  Problem Relation Age of Onset   Hypertension Other    Lung cancer Mother     ALLERGIES:  has No Known Allergies.  MEDICATIONS:  Current Outpatient Medications  Medication Sig Dispense Refill   acyclovir (ZOVIRAX) 400 MG tablet Take 1 tablet by mouth twice daily 60 tablet 0   amLODipine (NORVASC) 5 MG tablet Take 5 mg by mouth daily.     atorvastatin (LIPITOR) 40 MG tablet Take 1 tablet (40 mg total) by mouth at bedtime. 30 tablet 12   carvedilol (COREG) 12.5 MG tablet Take 12.5 mg by mouth 2 (two) times daily with a meal.     clopidogrel (PLAVIX) 75 MG tablet Take 1 tablet (75 mg total) by mouth daily with breakfast. 30 tablet 12   cyclobenzaprine (FLEXERIL) 10 MG tablet Take 10 mg by mouth daily as needed for muscle spasms.   2   escitalopram (LEXAPRO) 10 MG tablet Take 10 mg by mouth daily.     furosemide (LASIX) 80 MG tablet Take 1 tablet (80 mg total) by mouth in the morning AND 0.5 tablets (40 mg total) every evening. 135 tablet 3   ixazomib citrate (NINLARO) 3 MG capsule TAKE 1 CAPSULE (3 MG TOTAL) BY MOUTH ONCE A WEEK. ON DAY 1,8,15 EVERY 28 DAYS. TAKE ON AN EMPTY STOMACH 1HR BEFORE OR 2HRS AFTER FOOD. 3 capsule 1   montelukast (SINGULAIR) 10 MG tablet Take 10 mg by mouth daily.     omeprazole (PRILOSEC) 40 MG capsule Take 40 mg by mouth daily with breakfast.      oxyCODONE-acetaminophen (PERCOCET) 10-325 MG tablet Take 1 tablet by mouth 2 (two) times daily. 10  tablet 0   Potassium Chloride CR (MICRO-K) 8 MEQ CPCR capsule CR Take 8 mEq by mouth daily.     SYMBICORT 160-4.5 MCG/ACT inhaler Inhale 2 puffs into the lungs 2 (two) times daily as needed (shortness of breath).      Vitamin D, Ergocalciferol, (DRISDOL) 1.25 MG (50000 UNIT) CAPS capsule Take 1 capsule by mouth once a week 12 capsule 0   warfarin (COUMADIN) 5 MG tablet Take 5 mg by mouth daily.     isosorbide mononitrate (IMDUR) 30 MG 24 hr tablet Take 1 tablet (30 mg total) by mouth daily. 90 tablet 3   No current facility-administered medications  for this visit.   Facility-Administered Medications Ordered in Other Visits  Medication Dose Route Frequency Provider Last Rate Last Admin   0.9 %  sodium chloride infusion   Intravenous Continuous Brunetta Genera, MD 10 mL/hr at 07/09/17 1423 New Bag at 07/09/17 1423    REVIEW OF SYSTEMS:   Review of Systems  Constitutional:  Negative for chills, fever and weight loss.  HENT:  Negative for congestion, nosebleeds and sore throat.   Respiratory:  Negative for cough, hemoptysis, shortness of breath and wheezing.   Cardiovascular:  Positive for leg swelling. Negative for chest pain.  Gastrointestinal:  Negative for abdominal pain, blood in stool, constipation, diarrhea, melena, nausea and vomiting.  Musculoskeletal:  Positive for back pain.  Skin:  Negative for rash.  Neurological:  Negative for dizziness and headaches.  Endo/Heme/Allergies:  Does not bruise/bleed easily.    PHYSICAL EXAMINATION:  ECOG FS:1 - Symptomatic but completely ambulatory  Vitals:   04/15/21 0938  BP: 116/69  Pulse: 61  Resp: 19  Temp: 97.8 F (36.6 C)  SpO2: 97%   Wt Readings from Last 3 Encounters:  04/15/21 224 lb 9.6 oz (101.9 kg)  04/09/21 224 lb 3.2 oz (101.7 kg)  03/12/21 225 lb (102.1 kg)   Body mass index is 33.17 kg/m.    Exam was given in a chair. GENERAL:alert, in no acute distress and comfortable SKIN: no acute rashes, no significant  lesions EYES: fatty tissue in right eye with redness and swelling OROPHARYNX: MMM, no exudates, no oropharyngeal erythema or ulceration NECK: supple, no JVD LYMPH:  no palpable lymphadenopathy in the cervical, axillary or inguinal regions LUNGS: clear to auscultation b/l with normal respiratory effort HEART: regular rate & rhythm ABDOMEN:  normoactive bowel sounds , non tender, not distended. Extremity: mild edema right greater than left lower extremity.  PSYCH: alert & oriented x 3 with fluent speech NEURO: no focal motor/sensory deficits  LABORATORY DATA:  I have reviewed the data as listed  CBC Latest Ref Rng & Units 04/15/2021 01/29/2021 12/03/2020  WBC 4.0 - 10.5 K/uL 3.6(L) 4.2 4.9  Hemoglobin 13.0 - 17.0 g/dL 10.4(L) 11.0(L) 11.6(L)  Hematocrit 39.0 - 52.0 % 33.2(L) 35.4(L) 39.1  Platelets 150 - 400 K/uL 100(L) 92(L) 106(L)    CBC    Component Value Date/Time   WBC 3.6 (L) 04/15/2021 0911   WBC 4.2 01/29/2021 0857   RBC 3.51 (L) 04/15/2021 0911   HGB 10.4 (L) 04/15/2021 0911   HGB 9.2 (L) 10/01/2017 1112   HCT 33.2 (L) 04/15/2021 0911   HCT 29.3 (L) 10/01/2017 1112   PLT 100 (L) 04/15/2021 0911   PLT 170 10/01/2017 1112   MCV 94.6 04/15/2021 0911   MCV 88.5 10/01/2017 1112   MCH 29.6 04/15/2021 0911   MCHC 31.3 04/15/2021 0911   RDW 16.6 (H) 04/15/2021 0911   RDW 15.3 (H) 10/01/2017 1112   LYMPHSABS 0.4 (L) 04/15/2021 0911   LYMPHSABS 0.5 (L) 10/01/2017 1112   MONOABS 0.4 04/15/2021 0911   MONOABS 0.5 10/01/2017 1112   EOSABS 0.1 04/15/2021 0911   EOSABS 0.1 10/01/2017 1112   BASOSABS 0.0 04/15/2021 0911   BASOSABS 0.0 10/01/2017 1112   RADIOGRAPHIC STUDIES: No images were reviewed during this visit.   ASSESSMENT & PLAN:  1) Light chain (kappa) Multiple myeloma  -Diagnosed in 09/2016 with anemia, hypercalcemia and renal insufficiency, lytic lesions in the right iliac bone and possibly in the L spine. -Bone marrow bx -- shows 67%-80 %plasma cells consistent  with multiple myeloma.   K/L 95,No M spike on SPEP -- suggests light chain MM, Cytogenetics - normal male chromosomes, FISH- +11 and 13q-/-13 -Patient started treatment with Velcade + dexamethasone on 10/27/2016 along with monthly Pamidronate (Aredia) monthly.  -Added Cytoxan 200 mg/m2 starting Cycle 2 on 11/17/2016.Cytoxan held C3D15 due to thrombocytopenia. -Discontinued VCd after C5D8 due to intolerance with diarrhea and increasing neuropathy and fatigue with borderline functional status. -Patient did not tolerate maintenance Revlimid 10 mg by mouth daily due to grade 2-3 diarrhea. This was discontinued and his diarrhea resolved. -Initiated Ninlaro 3 mg once a week on Days 1, 8 and 15 in a 28 day cycle.  -Most recent PET scan from 11/19/2020 revealed intensely hypermetabolic lytic lesion in the left iliac wing,consistent with the given history of multiple myeloma. Mildly hypermetabolic old bilateral rib fractures, some of which are slightly expansile in appearance and may have been pathologic in etiology.Liver appears cirrhotic. Cholelithiasis.Bilateral nephrolithiasis.  Bladder stone. Enlarged prostate.Aortic atherosclerosis -Patient received palliative radiation to pelvis from 12/25/2020-01/07/2021. 25 Gy in 10 Fx.  -Most recent myeloma panel from 01/29/2021 was unremarkable without evidence of monoclonal protein.   -Patient returns today, 04/15/2021, for a follow up prior to Aredia injection and while on Ninlaro. Labs from today were reviewed that required no intervention. WBC, hemoglobin and platelet count are all stable and/or improved. Creatinine levels are elevated but stable. Recommend to proceed with treatment as planned. Patient will return to clinic on 05/20/2021 for a visit with Dr. Alean Kromer Limbo and monthly Aredia infusion.          2) CAD: -s/p prox LAD DES PCI on 07/31/2016 with ischemic cardiomyopathy ejection fraction of 25-35%.  -Rpt ECHO on 10/03/2016 shows improvement in EF to 55-60% with no  RWMABN.   3) Normocytic Anemia: -Secondary to MM and and Ninlaro.  -Stable today with hemoglobin at 10.4, continue to monitor.   4) Thrombocytopenia  -Secondary to MM and treatment - -improved today 100K, continue to monitor.    5) B12 deficiency - -Currently on SL B12 1043mg po daily   6) h/o Afib with RVR on coumadin Some bradycardia with BB -continue mx per PCP and cardiology  7) Back pain  - multiple chronic compression fractures with some worsening -On bisphosphonates already -Ergocalciferol 50k units weekly -Advised that the pt not lift anything heavy  -Discussed the 11/15/18 CT imaging, as noted above, did refer pt to orthopedist -Pt has been evaluated by orthopedist in December 2020    All of the patient's questions were answered with apparent satisfaction. The patient knows to call the clinic with any problems, questions or concerns.  I have spent a total of 30 minutes minutes of face-to-face and non-face-to-face time, preparing to see the patient, obtaining and/or reviewing separately obtained history, performing a medically appropriate examination, counseling and educating the patient,  documenting clinical information in the electronic health record, and care coordination.   ILincoln BrighamPA-C Hematology and Oncology CLifestream Behavioral CenterCancer CUticaP: [phone]

## 2021-04-15 NOTE — Patient Instructions (Signed)
Pamidronate Injection What is this medicine? PAMIDRONATE (pa mi DROE nate) slows calcium loss from bones. It treats Paget's disease and high calcium levels in the blood from some kinds of cancer. It may be used in other people at risk for bone loss. This medicine may be used for other purposes; ask your health care provider or pharmacist if you have questions. COMMON BRAND NAME(S): Aredia What should I tell my health care provider before I take this medicine? They need to know if you have any of these conditions:  bleeding disorder  cancer  dental disease  kidney disease  low levels of calcium or other minerals in the blood  low red blood cell counts  receiving steroids like dexamethasone or prednisone  an unusual or allergic reaction to pamidronate, other drugs, foods, dyes or preservatives  pregnant or trying to get pregnant  breast-feeding How should I use this medicine? This drug is injected into a vein. It is given by a health care provider in a hospital or clinic setting. Talk to your health care provider about the use of this drug in children. Special care may be needed. Overdosage: If you think you have taken too much of this medicine contact a poison control center or emergency room at once. NOTE: This medicine is only for you. Do not share this medicine with others. What if I miss a dose? Keep appointments for follow-up doses. It is important not to miss your dose. Call your health care provider if you are unable to keep an appointment. What may interact with this medicine?  certain antibiotics given by injection  medicines for inflammation or pain like ibuprofen, naproxen  some diuretics like bumetanide, furosemide  cyclosporine  parathyroid hormone  tacrolimus  teriparatide  thalidomide This list may not describe all possible interactions. Give your health care provider a list of all the medicines, herbs, non-prescription drugs, or dietary supplements  you use. Also tell them if you smoke, drink alcohol, or use illegal drugs. Some items may interact with your medicine. What should I watch for while using this medicine? Visit your health care provider for regular checks on your progress. It may be some time before you see the benefit from this drug. Some people who take this drug have severe bone, joint, or muscle pain. This drug may also increase your risk for jaw problems or a broken thigh bone. Tell your health care provider right away if you have severe pain in your jaw, bones, joints, or muscles. Tell you health care provider if you have any pain that does not go away or that gets worse. Tell your dentist and dental surgeon that you are taking this drug. You should not have major dental surgery while on this drug. See your dentist to have a dental exam and fix any dental problems before starting this drug. Take good care of your teeth while on this drug. Make sure you see your dentist for regular follow-up appointments. You should make sure you get enough calcium and vitamin D while you are taking this drug. Discuss the foods you eat and the vitamins you take with your health care provider. You may need blood work done while you are taking this drug. Do not become pregnant while taking this drug. Women should inform their health care provider if they wish to become pregnant or think they might be pregnant. There is potential for serious harm to an unborn child. Talk to your health care provider for more information. What side effects   may I notice from receiving this medicine? Side effects that you should report to your doctor or health care provider as soon as possible:  allergic reactions (skin rash, itching or hives; swelling of the face, lips, or tongue)  bleeding (bloody or black, tarry stools; red or dark brown urine; spitting up blood or brown material that looks like coffee grounds; red spots on the skin; unusual bruising or bleeding from  the eyes, gums, or nose)  bone pain  increased thirst  infection (fever, chills, cough, sore throat, pain or trouble passing urine)  jaw pain, especially after dental work  joint pain  kidney injury (trouble passing urine or change in the amount of urine)  low calcium levels (fast heartbeat; muscle cramps or pain; pain, tingling, or numbness in the hands or feet; seizures)  low magnesium levels (fast, irregular heartbeat; muscle cramp or pain; muscle weakness; tremors; seizures)  low potassium levels (trouble breathing; chest pain; dizziness; fast, irregular heartbeat; feeling faint or lightheaded, falls; muscle cramps or pain)  muscle pain  pain, redness, or irritation at site where injected  redness, blistering, peeling, or loosening of the skin, including inside the mouth  severe diarrhea  unusual sweating Side effects that usually do not require medical attention (report to your doctor or health care provider if they continue or are bothersome):  constipation  eye irritation, itching, or pain  fever  headache  increase in blood pressure  loss of appetite  nausea  stomach pain  unusually weak or tired  vomiting This list may not describe all possible side effects. Call your doctor for medical advice about side effects. You may report side effects to FDA at 1-800-FDA-1088. Where should I keep my medicine? This drug is given in a hospital or clinic. It will not be stored at home. NOTE: This sheet is a summary. It may not cover all possible information. If you have questions about this medicine, talk to your doctor, pharmacist, or health care provider.  2021 Elsevier/Gold Standard (2019-08-29 12:19:52)  

## 2021-04-16 ENCOUNTER — Other Ambulatory Visit: Payer: Self-pay | Admitting: Hematology

## 2021-04-16 DIAGNOSIS — C9000 Multiple myeloma not having achieved remission: Secondary | ICD-10-CM

## 2021-04-19 ENCOUNTER — Other Ambulatory Visit (HOSPITAL_COMMUNITY): Payer: Self-pay

## 2021-05-06 ENCOUNTER — Other Ambulatory Visit (HOSPITAL_COMMUNITY): Payer: Self-pay

## 2021-05-07 ENCOUNTER — Other Ambulatory Visit (HOSPITAL_COMMUNITY): Payer: Self-pay

## 2021-05-19 NOTE — Progress Notes (Signed)
HEMATOLOGY/ONCOLOGY CLINIC NOTE  Date of Service: 05/19/21    Patient Care Team: Sandi Mariscal, MD as PCP - General (Internal Medicine) Lorretta Harp, MD as PCP - Cardiology (Cardiology)  CHIEF COMPLAINTS/PURPOSE OF CONSULTATION:   F/u for continued management of Myeloma   HISTORY OF PRESENTING ILLNESS:  plz see previous note for details on HPI  INTERVAL HISTORY: Richard Vang is here for his scheduled followup for multiple myeloma. The patient's last visit with [name] was on 01/29/2021. The pt reports that he is doing well overall.  The pt reports persistent unchanged neuropathy and chronic back pain. No other acute new focal symptoms.  Lab results today 05/20/2021 of CBC w/diff - hgb stable at 10.7, plt 101k wbc count stable at 5k - ckd creatinne 2.24  -K/L light chains and ratio- stable-- no overt progression of myeloma. No reported toxicity from NInlaro maintenance at this time.  On review of systems, pt reports no other acute new symptoms.   MEDICAL HISTORY:  Past Medical History:  Diagnosis Date   Arthritis    "left shoulder" (07/31/2016)   Coronary artery disease    GERD (gastroesophageal reflux disease)    Heart murmur    High cholesterol    Hypertension    Multiple myeloma ([place])    Sleep apnea    "probably; having test in November" (07/31/2016)   Type II diabetes mellitus ([place])     SURGICAL HISTORY: Past Surgical History:  Procedure Laterality Date   CARDIAC CATHETERIZATION N/A 07/31/2016   Procedure: Left Heart Cath and Coronary Angiography;  Surgeon: Lorretta Harp, MD;  Location: [facility];  Service: Cardiovascular;  Laterality: N/A;   CARDIAC CATHETERIZATION N/A 07/31/2016   Procedure: Coronary Stent Intervention;  Surgeon: Lorretta Harp, MD;  Location: [place] [facility];  Service: Cardiovascular;  Laterality: N/A;   CORONARY ANGIOPLASTY     OPEN REDUCTION INTERNAL FIXATION (ORIF) METACARPAL Left 05/21/2018   Procedure: OPEN REDUCTION  INTERNAL FIXATION (ORIF) 2ND METACARPAL;  Surgeon: Shona Needles, MD;  Location: [facility];  Service: Orthopedics;  Laterality: Left;   ORIF CLAVICULAR FRACTURE Right 05/21/2018   Procedure: OPEN REDUCTION INTERNAL FIXATION (ORIF) CLAVICULAR FRACTURE;  Surgeon: Shona Needles, MD;  Location: [facility];  Service: Orthopedics;  Laterality: Right;   SHOULDER SURGERY Left 1973   "put pin in it where it had separated"    TONSILLECTOMY  ~ 1956    SOCIAL HISTORY: Social History   Socioeconomic History   Marital status: Divorced    Spouse name: Not on file   Number of children: Not on file   Years of education: Not on file   Highest education level: Not on file  Occupational History   Not on file  Tobacco Use   Smoking status: Never   Smokeless tobacco: Never  Vaping Use   Vaping Use: Never used  Substance and Sexual Activity   Alcohol use: Not Currently    Comment: 07/31/2016 "nothing since 2002"   Drug use: No   Sexual activity: Not Currently  Other Topics Concern   Not on file  Social History Narrative   Not on file   Social Determinants of Health   Financial Resource Strain: Low Risk    Difficulty of Paying Living Expenses: Not hard at all  Food Insecurity: No Food Insecurity   Worried About Charity fundraiser in the Last Year: Never true   [place] in the Last Year: Never true  Transportation Needs: No Data processing manager (Medical): No   Lack of Transportation (Non-Medical): No  Physical Activity: Inactive   Days of Exercise per Week: 0 days   Minutes of Exercise per Session: 0 min  Stress: Not on file  Social Connections: Moderately Integrated   Frequency of Communication with Friends and Family: More than three times a week   Frequency of Social Gatherings with Friends and Family: More than three times a week   Attends Religious Services: Never   Marine scientist or Organizations: Yes   Attends Archivist Meetings: Never    Marital Status: Living with partner  Intimate Partner Violence: Not At Risk   Fear of Current or Ex-Partner: No   Emotionally Abused: No   Physically Abused: No   Sexually Abused: No    FAMILY HISTORY: Family History  Problem Relation Age of Onset   Hypertension Other    Lung cancer Mother     ALLERGIES:  has No Known Allergies.  MEDICATIONS:  Current Outpatient Medications  Medication Sig Dispense Refill   acyclovir (ZOVIRAX) 400 MG tablet Take 1 tablet by mouth twice daily 60 tablet 0   amLODipine (NORVASC) 5 MG tablet Take 5 mg by mouth daily.     atorvastatin (LIPITOR) 40 MG tablet Take 1 tablet (40 mg total) by mouth at bedtime. 30 tablet 12   carvedilol (COREG) 12.5 MG tablet Take 12.5 mg by mouth 2 (two) times daily with a meal.     clopidogrel (PLAVIX) 75 MG tablet Take 1 tablet (75 mg total) by mouth daily with breakfast. 30 tablet 12   cyclobenzaprine (FLEXERIL) 10 MG tablet Take 10 mg by mouth daily as needed for muscle spasms.   2   escitalopram (LEXAPRO) 10 MG tablet Take 10 mg by mouth daily.     furosemide (LASIX) 80 MG tablet Take 1 tablet (80 mg total) by mouth in the morning AND 0.5 tablets (40 mg total) every evening. 135 tablet 3   isosorbide mononitrate (IMDUR) 30 MG 24 hr tablet Take 1 tablet (30 mg total) by mouth daily. 90 tablet 3   ixazomib citrate (NINLARO) 3 MG capsule TAKE 1 CAPSULE (3 MG TOTAL) BY MOUTH ONCE A WEEK. ON DAY 1,8,15 EVERY 28 DAYS. TAKE ON AN EMPTY STOMACH 1HR BEFORE OR 2HRS AFTER FOOD. 3 capsule 1   montelukast (SINGULAIR) 10 MG tablet Take 10 mg by mouth daily.     omeprazole (PRILOSEC) 40 MG capsule Take 40 mg by mouth daily with breakfast.      oxyCODONE-acetaminophen (PERCOCET) 10-325 MG tablet Take 1 tablet by mouth 2 (two) times daily. 10 tablet 0   Potassium Chloride CR (MICRO-K) 8 MEQ CPCR capsule CR Take 8 mEq by mouth daily.     SYMBICORT 160-4.5 MCG/ACT inhaler Inhale 2 puffs into the lungs 2 (two) times daily as needed  (shortness of breath).      Vitamin D, Ergocalciferol, (DRISDOL) 1.25 MG (50000 UNIT) CAPS capsule Take 1 capsule by mouth once a week 12 capsule 0   warfarin (COUMADIN) 5 MG tablet Take 5 mg by mouth daily.     No current facility-administered medications for this visit.   Facility-Administered Medications Ordered in Other Visits  Medication Dose Route Frequency Provider Last Rate Last Admin   0.9 %  sodium chloride infusion   Intravenous Continuous Brunetta Genera, MD 10 mL/hr at 07/09/17 1423 New Bag at 07/09/17 1423    REVIEW OF SYSTEMS:  10 Point review of Systems was done is negative except as noted above.  PHYSICAL EXAMINATION:  ECOG FS:2 - Symptomatic, <50% confined to bed  Vitals:   05/20/21 1030  BP: (!) 95/59  Pulse: 83  Resp: 19  Temp: 98.2 F (36.8 C)  SpO2: 96%    Wt Readings from Last 3 Encounters:  04/15/21 224 lb 9.6 oz (101.9 kg)  04/09/21 224 lb 3.2 oz (101.7 kg)  03/12/21 225 lb (102.1 kg)   There is no height or weight on file to calculate BMI.    NAD GENERAL:alert, in no acute distress and comfortable SKIN: no acute rashes, no significant lesions EYES: conjunctiva are pink and non-injected, sclera anicteric OROPHARYNX: MMM, no exudates, no oropharyngeal erythema or ulceration NECK: supple, no JVD LYMPH:  no palpable lymphadenopathy in the cervical, axillary or inguinal regions LUNGS: clear to auscultation b/l with normal respiratory effort HEART: regular rate & rhythm ABDOMEN:  normoactive bowel sounds , non tender, not distended. Extremity: no pedal edema PSYCH: alert & oriented x 3 with fluent speech NEURO: no focal motor/sensory deficits  LABORATORY DATA:  I have reviewed the data as listed  CBC Latest Ref Rng & Units 04/15/2021 01/29/2021 12/03/2020  WBC 4.0 - 10.5 K/uL 3.6(L) 4.2 4.9  Hemoglobin 13.0 - 17.0 g/dL 10.4(L) 11.0(L) 11.6(L)  Hematocrit 39.0 - 52.0 % 33.2(L) 35.4(L) 39.1  Platelets 150 - 400 K/uL 100(L) 92(L) 106(L)     CBC    Component Value Date/Time   WBC 3.6 (L) 04/15/2021 0911   WBC 4.2 01/29/2021 0857   RBC 3.51 (L) 04/15/2021 0911   HGB 10.4 (L) 04/15/2021 0911   HGB 9.2 (L) 10/01/2017 1112   HCT 33.2 (L) 04/15/2021 0911   HCT 29.3 (L) 10/01/2017 1112   PLT 100 (L) 04/15/2021 0911   PLT 170 10/01/2017 1112   MCV 94.6 04/15/2021 0911   MCV 88.5 10/01/2017 1112   MCH 29.6 04/15/2021 0911   MCHC 31.3 04/15/2021 0911   RDW 16.6 (H) 04/15/2021 0911   RDW 15.3 (H) 10/01/2017 1112   LYMPHSABS 0.4 (L) 04/15/2021 0911   LYMPHSABS 0.5 (L) 10/01/2017 1112   MONOABS 0.4 04/15/2021 0911   MONOABS 0.5 10/01/2017 1112   EOSABS 0.1 04/15/2021 0911   EOSABS 0.1 10/01/2017 1112   BASOSABS 0.0 04/15/2021 0911   BASOSABS 0.0 10/01/2017 1112    . CMP Latest Ref Rng & Units 04/15/2021 04/09/2021 03/12/2021  Glucose 70 - 99 mg/dL 107(H) 107(H) 105(H)  BUN 8 - 23 mg/dL 34(H) 35(H) 39(H)  Creatinine 0.61 - 1.24 mg/dL 1.82(H) 1.76(H) 1.86(H)  Sodium 135 - 145 mmol/L 144 145(H) 143  Potassium 3.5 - 5.1 mmol/L 4.0 4.2 4.3  Chloride 98 - 111 mmol/L 108 108(H) 106  CO2 22 - 32 mmol/L _0 Calcium 8.9 - 10.3 mg/dL 9.1 9.3 9.2  Total Protein 6.5 - 8.1 g/dL 6.7 - -  Total Bilirubin 0.3 - 1.2 mg/dL 1.0 - -  Alkaline Phos 38 - 126 U/L 76 - -  AST 15 - 41 U/L 23 - -  ALT 0 - 44 U/L 14 - -   03/18/2019 MMP: Component     Latest Ref Rng & Units 03/18/2019  IgG (Immunoglobin G), Serum     603 - 1,613 mg/dL 1,012  IgA     61 - 437 mg/dL 51 (L)  IgM (Immunoglobulin M), Srm     15 - 143 mg/dL 27  Total Protein ELP  6.0 - 8.5 g/dL 6.3  Albumin SerPl Elph-Mcnc     2.9 - 4.4 g/dL 3.8  Alpha 1     0.0 - 0.4 g/dL 0.2  Alpha2 Glob SerPl Elph-Mcnc     0.4 - 1.0 g/dL 0.7  B-Globulin SerPl Elph-Mcnc     0.7 - 1.3 g/dL 0.8  Gamma Glob SerPl Elph-Mcnc     0.4 - 1.8 g/dL 0.8  M Protein SerPl Elph-Mcnc     Not Observed g/dL Not Observed  Globulin, Total     2.2 - 3.9 g/dL 2.5  Albumin/Glob SerPl      0.7 - 1.7 1.6  IFE 1      Comment  Please Note (HCV):      Comment    RADIOGRAPHIC STUDIES: I have personally reviewed the radiological images as listed and agreed with the findings in the report. No results found.  ASSESSMENT & PLAN:   75 y.o. 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   K/L 95, No M spike on SPEP -- suggests light chain MM Cytogenetics - normal male chromosomes FISH- +11 and 13q-/-13  Patient is s/p 1 cycle of Vd and Serum free kappa LC has decreased from 700 to 203.6 with improvement in his K/L ratio from 94.59 to 29. Completed 2nd cycle of treatment with Vd + Cytoxan (243m/m2) Completed 3rd cycle of treatment with Vd + Cytoxan (2082mm2) - cytoxan held D15 due to thrombocytopenia Completed 4th of VCd Discontinued VCd after C5D8 due to intolerance with diarrhea and increasing neuropathy and fatigue with borderline functional status. -Patient did not tolerate maintenance Revlimid 10 mg by mouth daily due to grade 2-3 diarrhea. This was discontinued and his diarrhea has resolved.  Currently on maintenance Ninlaro M spike absent   11/15/18 CT Pelvic revealed  3 mm stone in the right UVJ. 5 x 6 mm stone is identified in the left UVJ. No associated distal hydroureter. 2. Stable lucent lesion in the right iliac bone. 3. Small right groin hernia contains only fat. 11/15/18 CT Lumbar revealed  No acute fracture or malalignment. 2. Severe osteopenia and  multiple old compression fractures, severe at T11 and T12. 3. Severe canal stenosis L3-4. Multilevel neural foraminal narrowing: Moderate to severe at T12-L1. 4. Slowly enlarging solid LEFT renal mass. Recommend non emergent MRI renal mass protocol on non emergent basis -No obvious new myeloma involvement seen in most recent CT imaging.         2) CAD s/p prox LAD DES PCI on 07/31/2016 with ischemic cardiomyopathy ejection fraction of 25-35%. Rpt ECHO on 10/03/2016 shows improvement in EF to 55-60% with no RWMABN.   3) Macrocytic Anemia with moderate thrombocytopenia - due to MM and and Ninlaro. Stable.  4) Thrombocytopenia - due to MM and treatment --improved today 108k. --will monitor   5) B12 deficiency - Plan -cont SL B12 100066mpo daily   6) h/o Afib with RVR on coumadin Some bradycardia with BB -continue mx per PCP and cardiology  7) Back pain -- multiple chronic compression fractures with some worsening -  On bisphosphonates already -Ergocalciferol 50k units weekly -Advised that the pt not lift anything heavy  -Discussed the 11/15/18 CT imaging, as noted above, did refer pt to orthopedist -Pt has been evaluated by orthopedist in December 2020  PLAN:  -Discussed pt labwork today, 05/20/2021; labs reivewed with patient. -no overt progression of myeloma based on clinical evaluation and labs -The pt has no prohibitive toxicities from continuing Ninlaro at this time. -Continue Aredia q8weeks. -Continue weekly Ergocalciferol. - discussed Evusheld risks/benefits/ alternatives -- patient opted to proceed and did receive Evusheld today.   FOLLOW UP: Additional labs today PET scan in 6 weeks Please schedule next dose of earlier with labs and MD visit in 2 months. Evusheld today   The total time spent in the appointment was 30 minutes and more than 50% was on counseling and direct patient cares.  All of the patient's questions were answered with apparent satisfaction. The patient  knows to call the clinic with any problems, questions or concerns.   Sullivan Lone MD [place] AAHIVMS [facility] Hematology/Oncology Physician [facility]  (Office):       [phone] (Work cell):  [phone] (Fax):           [phone]  I, Reinaldo Raddle, am acting as scribe for Dr. Sullivan Lone, MD.  .I have reviewed the above documentation for accuracy and completeness, and I agree with the above. Brunetta Genera MD

## 2021-05-20 ENCOUNTER — Ambulatory Visit: Payer: Medicare HMO

## 2021-05-20 ENCOUNTER — Other Ambulatory Visit: Payer: Self-pay

## 2021-05-20 ENCOUNTER — Inpatient Hospital Stay: Payer: Medicare HMO | Attending: Hematology

## 2021-05-20 ENCOUNTER — Inpatient Hospital Stay: Payer: Medicare HMO | Admitting: Hematology

## 2021-05-20 VITALS — BP 95/59 | HR 83 | Temp 98.2°F | Resp 19 | Ht 69.0 in | Wt 222.9 lb

## 2021-05-20 DIAGNOSIS — I10 Essential (primary) hypertension: Secondary | ICD-10-CM | POA: Insufficient documentation

## 2021-05-20 DIAGNOSIS — G4730 Sleep apnea, unspecified: Secondary | ICD-10-CM | POA: Insufficient documentation

## 2021-05-20 DIAGNOSIS — C7951 Secondary malignant neoplasm of bone: Secondary | ICD-10-CM | POA: Diagnosis not present

## 2021-05-20 DIAGNOSIS — C9000 Multiple myeloma not having achieved remission: Secondary | ICD-10-CM

## 2021-05-20 DIAGNOSIS — M8580 Other specified disorders of bone density and structure, unspecified site: Secondary | ICD-10-CM | POA: Diagnosis not present

## 2021-05-20 DIAGNOSIS — E538 Deficiency of other specified B group vitamins: Secondary | ICD-10-CM | POA: Insufficient documentation

## 2021-05-20 DIAGNOSIS — I4891 Unspecified atrial fibrillation: Secondary | ICD-10-CM | POA: Insufficient documentation

## 2021-05-20 DIAGNOSIS — E119 Type 2 diabetes mellitus without complications: Secondary | ICD-10-CM | POA: Diagnosis not present

## 2021-05-20 DIAGNOSIS — Z7951 Long term (current) use of inhaled steroids: Secondary | ICD-10-CM | POA: Diagnosis not present

## 2021-05-20 DIAGNOSIS — Z79899 Other long term (current) drug therapy: Secondary | ICD-10-CM | POA: Insufficient documentation

## 2021-05-20 DIAGNOSIS — Z7901 Long term (current) use of anticoagulants: Secondary | ICD-10-CM | POA: Diagnosis not present

## 2021-05-20 DIAGNOSIS — Z7189 Other specified counseling: Secondary | ICD-10-CM

## 2021-05-20 DIAGNOSIS — E7800 Pure hypercholesterolemia, unspecified: Secondary | ICD-10-CM | POA: Insufficient documentation

## 2021-05-20 LAB — CBC WITH DIFFERENTIAL (CANCER CENTER ONLY)
Abs Immature Granulocytes: 0.02 10*3/uL (ref 0.00–0.07)
Basophils Absolute: 0 10*3/uL (ref 0.0–0.1)
Basophils Relative: 0 %
Eosinophils Absolute: 0.1 10*3/uL (ref 0.0–0.5)
Eosinophils Relative: 1 %
HCT: 33.7 % — ABNORMAL LOW (ref 39.0–52.0)
Hemoglobin: 10.7 g/dL — ABNORMAL LOW (ref 13.0–17.0)
Immature Granulocytes: 0 %
Lymphocytes Relative: 9 %
Lymphs Abs: 0.5 10*3/uL — ABNORMAL LOW (ref 0.7–4.0)
MCH: 30.2 pg (ref 26.0–34.0)
MCHC: 31.8 g/dL (ref 30.0–36.0)
MCV: 95.2 fL (ref 80.0–100.0)
Monocytes Absolute: 0.6 10*3/uL (ref 0.1–1.0)
Monocytes Relative: 13 %
Neutro Abs: 3.8 10*3/uL (ref 1.7–7.7)
Neutrophils Relative %: 77 %
Platelet Count: 101 10*3/uL — ABNORMAL LOW (ref 150–400)
RBC: 3.54 MIL/uL — ABNORMAL LOW (ref 4.22–5.81)
RDW: 15.9 % — ABNORMAL HIGH (ref 11.5–15.5)
WBC Count: 5 10*3/uL (ref 4.0–10.5)
nRBC: 0 % (ref 0.0–0.2)

## 2021-05-20 LAB — CMP (CANCER CENTER ONLY)
ALT: 13 U/L (ref 0–44)
AST: 21 U/L (ref 15–41)
Albumin: 3.8 g/dL (ref 3.5–5.0)
Alkaline Phosphatase: 75 U/L (ref 38–126)
Anion gap: 8 (ref 5–15)
BUN: 43 mg/dL — ABNORMAL HIGH (ref 8–23)
CO2: 28 mmol/L (ref 22–32)
Calcium: 9.1 mg/dL (ref 8.9–10.3)
Chloride: 107 mmol/L (ref 98–111)
Creatinine: 2.24 mg/dL — ABNORMAL HIGH (ref 0.61–1.24)
GFR, Estimated: 30 mL/min — ABNORMAL LOW (ref >60)
Glucose, Bld: 116 mg/dL — ABNORMAL HIGH (ref 70–99)
Potassium: 3.9 mmol/L (ref 3.5–5.1)
Sodium: 143 mmol/L (ref 135–145)
Total Bilirubin: 0.9 mg/dL (ref 0.3–1.2)
Total Protein: 6.9 g/dL (ref 6.5–8.1)

## 2021-05-20 MED ORDER — METHYLPREDNISOLONE SODIUM SUCC 125 MG IJ SOLR: 125.0000 mg | Freq: Once | INTRAMUSCULAR | Status: DC | PRN

## 2021-05-20 MED ORDER — DIPHENHYDRAMINE HCL 50 MG/ML IJ SOLN: 50.0000 mg | Freq: Once | INTRAMUSCULAR | Status: DC | PRN

## 2021-05-20 MED ORDER — ALBUTEROL SULFATE HFA 108 (90 BASE) MCG/ACT IN AERS: 2.0000 | INHALATION_SPRAY | Freq: Once | RESPIRATORY_TRACT | Status: DC | PRN

## 2021-05-20 MED ORDER — TIXAGEVIMAB (PART OF EVUSHELD) INJECTION
300.0000 mg | Freq: Once | INTRAMUSCULAR | Status: AC
  Administered 2021-05-20: 300 mg via INTRAMUSCULAR
  Filled 2021-05-20: qty 3

## 2021-05-20 MED ORDER — SODIUM CHLORIDE 0.9 % IV SOLN
60.0000 mg | Freq: Once | INTRAVENOUS | Status: AC
  Administered 2021-05-20: 60 mg via INTRAVENOUS
  Filled 2021-05-20: qty 20

## 2021-05-20 MED ORDER — EPINEPHRINE 0.3 MG/0.3ML IJ SOAJ: 0.3000 mg | Freq: Once | INTRAMUSCULAR | Status: DC | PRN

## 2021-05-20 MED ORDER — CILGAVIMAB (PART OF EVUSHELD) INJECTION
300.0000 mg | Freq: Once | INTRAMUSCULAR | Status: AC
  Administered 2021-05-20: 300 mg via INTRAMUSCULAR
  Filled 2021-05-20: qty 3

## 2021-05-20 MED ORDER — TIXAGEVIMAB (PART OF EVUSHELD) INJECTION: 300.0000 mg | Freq: Once | INTRAMUSCULAR | Status: DC

## 2021-05-20 MED ORDER — CILGAVIMAB (PART OF EVUSHELD) INJECTION: 300.0000 mg | Freq: Once | INTRAMUSCULAR | Status: DC

## 2021-05-20 NOTE — Patient Instructions (Signed)
Pamidronate Injection What is this medication? PAMIDRONATE (pa mi DROE nate) slows calcium loss from bones. It treats Paget's disease and high calcium levels in the blood from some kinds of cancer. It maybe used in other people at risk for bone loss. This medicine may be used for other purposes; ask your health care provider orpharmacist if you have questions. COMMON BRAND NAME(S): Aredia What should I tell my care team before I take this medication? They need to know if you have any of these conditions: bleeding disorder cancer dental disease kidney disease low levels of calcium or other minerals in the blood low red blood cell counts receiving steroids like dexamethasone or prednisone an unusual or allergic reaction to pamidronate, other drugs, foods, dyes or preservatives pregnant or trying to get pregnant breast-feeding How should I use this medication? This drug is injected into a vein. It is given by a health care provider in [place] or clinic setting. Talk to your health care provider about the use of this drug in children.Special care may be needed. Overdosage: If you think you have taken too much of this medicine contact apoison control center or emergency room at once. NOTE: This medicine is only for you. Do not share this medicine with others. What if I miss a dose? Keep appointments for follow-up doses. It is important not to miss your dose.Call your health care provider if you are unable to keep an appointment. What may interact with this medication? certain antibiotics given by injection medicines for inflammation or pain like ibuprofen, naproxen some diuretics like bumetanide, furosemide cyclosporine parathyroid hormone tacrolimus teriparatide thalidomide This list may not describe all possible interactions. Give your health care provider a list of all the medicines, herbs, non-prescription drugs, or dietary supplements you use. Also tell them if you smoke, drink  alcohol, or use illegaldrugs. Some items may interact with your medicine. What should I watch for while using this medication? Visit your health care provider for regular checks on your progress. It may besome time before you see the benefit from this drug. Some people who take this drug have severe bone, joint, or muscle pain. This drug may also increase your risk for jaw problems or a broken thigh bone. Tell your health care provider right away if you have severe pain in your jaw, bones, joints, or muscles. Tell you health care provider if you have any painthat does not go away or that gets worse. Tell your dentist and dental surgeon that you are taking this drug. You should not have major dental surgery while on this drug. See your dentist to have a dental exam and fix any dental problems before starting this drug. Take good care of your teeth while on this drug. Make sure you see your dentist forregular follow-up appointments. You should make sure you get enough calcium and vitamin D while you are taking this drug. Discuss the foods you eat and the vitamins you take with your healthcare provider. You may need blood work done while you are taking this drug. Do not become pregnant while taking this drug. Women should inform their health care provider if they wish to become pregnant or think they might be pregnant. There is potential for serious harm to an unborn child. Talk to your healthcare provider for more information. What side effects may I notice from receiving this medication? Side effects that you should report to your doctor or health care provider assoon as possible: allergic reactions (skin rash, itching or hives; swelling  of the face, lips, or tongue) bleeding (bloody or black, tarry stools; red or dark brown urine; spitting up blood or brown material that looks like coffee grounds; red spots on the skin; unusual bruising or bleeding from the eyes, gums, or nose) bone pain increased  thirst infection (fever, chills, cough, sore throat, pain or trouble passing urine) jaw pain, especially after dental work joint pain kidney injury (trouble passing urine or change in the amount of urine) low calcium levels (fast heartbeat; muscle cramps or pain; pain, tingling, or numbness in the hands or feet; seizures) low magnesium levels (fast, irregular heartbeat; muscle cramp or pain; muscle weakness; tremors; seizures) low potassium levels (trouble breathing; chest pain; dizziness; fast, irregular heartbeat; feeling faint or lightheaded, falls; muscle cramps or pain) muscle pain pain, redness, or irritation at site where injected redness, blistering, peeling, or loosening of the skin, including inside the mouth severe diarrhea unusual sweating Side effects that usually do not require medical attention (report to yourdoctor or health care provider if they continue or are bothersome): constipation eye irritation, itching, or pain fever headache increase in blood pressure loss of appetite nausea stomach pain unusually weak or tired vomiting This list may not describe all possible side effects. Call your doctor for medical advice about side effects. You may report side effects to FDA at1-800-FDA-1088. Where should I keep my medication? This drug is given in a hospital or clinic. It will not be stored at home. NOTE: This sheet is a summary. It may not cover all possible information. If you have questions about this medicine, talk to your doctor, pharmacist, orhealth care provider.  2022 Elsevier/Gold Standard (2019-08-29 12:19:52)

## 2021-05-21 ENCOUNTER — Telehealth: Payer: Self-pay | Admitting: Hematology

## 2021-05-21 LAB — KAPPA/LAMBDA LIGHT CHAINS
Kappa free light chain: 84.8 mg/L — ABNORMAL HIGH (ref 3.3–19.4)
Kappa, lambda light chain ratio: 4.76 — ABNORMAL HIGH (ref 0.26–1.65)
Lambda free light chains: 17.8 mg/L (ref 5.7–26.3)

## 2021-05-21 NOTE — Telephone Encounter (Signed)
Left message with follow-up appointment per 8/1 los. 

## 2021-05-22 ENCOUNTER — Other Ambulatory Visit: Payer: Self-pay | Admitting: Hematology

## 2021-05-22 DIAGNOSIS — C9000 Multiple myeloma not having achieved remission: Secondary | ICD-10-CM

## 2021-05-22 LAB — MULTIPLE MYELOMA PANEL, SERUM
Albumin SerPl Elph-Mcnc: 3.6 g/dL (ref 2.9–4.4)
Albumin/Glob SerPl: 1.3 (ref 0.7–1.7)
Alpha 1: 0.2 g/dL (ref 0.0–0.4)
Alpha2 Glob SerPl Elph-Mcnc: 0.7 g/dL (ref 0.4–1.0)
B-Globulin SerPl Elph-Mcnc: 0.9 g/dL (ref 0.7–1.3)
Gamma Glob SerPl Elph-Mcnc: 1.1 g/dL (ref 0.4–1.8)
Globulin, Total: 2.8 g/dL (ref 2.2–3.9)
IgA: 68 mg/dL (ref 61–437)
IgG (Immunoglobin G), Serum: 1103 mg/dL (ref 603–1613)
IgM (Immunoglobulin M), Srm: 22 mg/dL (ref 15–143)
Total Protein ELP: 6.4 g/dL (ref 6.0–8.5)

## 2021-05-26 ENCOUNTER — Encounter: Payer: Self-pay | Admitting: Hematology

## 2021-06-04 ENCOUNTER — Other Ambulatory Visit: Payer: Self-pay | Admitting: Hematology

## 2021-06-04 ENCOUNTER — Other Ambulatory Visit (HOSPITAL_COMMUNITY): Payer: Self-pay

## 2021-06-04 DIAGNOSIS — C9000 Multiple myeloma not having achieved remission: Secondary | ICD-10-CM

## 2021-06-04 MED ORDER — IXAZOMIB CITRATE 3 MG PO CAPS
ORAL_CAPSULE | ORAL | 1 refills | Status: DC
  Filled 2021-06-04: qty 3, 28d supply, fill #0
  Filled 2021-07-01: qty 3, 28d supply, fill #1

## 2021-06-05 ENCOUNTER — Other Ambulatory Visit (HOSPITAL_COMMUNITY): Payer: Self-pay

## 2021-06-17 ENCOUNTER — Other Ambulatory Visit: Payer: Self-pay | Admitting: Hematology

## 2021-06-17 DIAGNOSIS — C9000 Multiple myeloma not having achieved remission: Secondary | ICD-10-CM

## 2021-07-01 ENCOUNTER — Other Ambulatory Visit (HOSPITAL_COMMUNITY): Payer: Self-pay

## 2021-07-03 ENCOUNTER — Other Ambulatory Visit: Payer: Self-pay

## 2021-07-03 ENCOUNTER — Ambulatory Visit (HOSPITAL_COMMUNITY)
Admission: RE | Admit: 2021-07-03 | Discharge: 2021-07-03 | Disposition: A | Discharge status: Home / Self Care | Payer: Medicare HMO | Source: Ambulatory Visit | Attending: Hematology | Admitting: Hematology

## 2021-07-03 DIAGNOSIS — I2510 Atherosclerotic heart disease of native coronary artery without angina pectoris: Secondary | ICD-10-CM | POA: Diagnosis not present

## 2021-07-03 DIAGNOSIS — J322 Chronic ethmoidal sinusitis: Secondary | ICD-10-CM | POA: Diagnosis not present

## 2021-07-03 DIAGNOSIS — C9000 Multiple myeloma not having achieved remission: Secondary | ICD-10-CM | POA: Insufficient documentation

## 2021-07-03 DIAGNOSIS — I517 Cardiomegaly: Secondary | ICD-10-CM | POA: Diagnosis not present

## 2021-07-03 DIAGNOSIS — N200 Calculus of kidney: Secondary | ICD-10-CM | POA: Diagnosis not present

## 2021-07-03 DIAGNOSIS — N210 Calculus in bladder: Secondary | ICD-10-CM | POA: Insufficient documentation

## 2021-07-03 DIAGNOSIS — I700 Atherosclerosis of aorta: Secondary | ICD-10-CM | POA: Diagnosis not present

## 2021-07-03 DIAGNOSIS — N400 Enlarged prostate without lower urinary tract symptoms: Secondary | ICD-10-CM | POA: Insufficient documentation

## 2021-07-03 DIAGNOSIS — K8020 Calculus of gallbladder without cholecystitis without obstruction: Secondary | ICD-10-CM | POA: Insufficient documentation

## 2021-07-03 LAB — GLUCOSE, CAPILLARY: Glucose-Capillary: 111 mg/dL — ABNORMAL HIGH (ref 70–99)

## 2021-07-03 MED ORDER — FLUDEOXYGLUCOSE F - 18 (FDG) INJECTION
11.0000 | Freq: Once | INTRAVENOUS | Status: AC
  Administered 2021-07-03: 11 via INTRAVENOUS

## 2021-07-04 ENCOUNTER — Other Ambulatory Visit (HOSPITAL_COMMUNITY): Payer: Self-pay

## 2021-07-15 ENCOUNTER — Inpatient Hospital Stay (HOSPITAL_BASED_OUTPATIENT_CLINIC_OR_DEPARTMENT_OTHER): Payer: Medicare HMO | Admitting: Hematology

## 2021-07-15 ENCOUNTER — Other Ambulatory Visit: Payer: Self-pay

## 2021-07-15 ENCOUNTER — Inpatient Hospital Stay: Payer: Medicare HMO | Attending: Hematology

## 2021-07-15 ENCOUNTER — Inpatient Hospital Stay: Payer: Medicare HMO

## 2021-07-15 VITALS — BP 130/77 | HR 75 | Temp 98.7°F | Resp 19 | Ht 69.0 in | Wt 223.3 lb

## 2021-07-15 DIAGNOSIS — Z7189 Other specified counseling: Secondary | ICD-10-CM

## 2021-07-15 DIAGNOSIS — D696 Thrombocytopenia, unspecified: Secondary | ICD-10-CM | POA: Diagnosis not present

## 2021-07-15 DIAGNOSIS — C9000 Multiple myeloma not having achieved remission: Secondary | ICD-10-CM | POA: Diagnosis present

## 2021-07-15 LAB — CMP (CANCER CENTER ONLY)
ALT: 14 U/L (ref 0–44)
AST: 23 U/L (ref 15–41)
Albumin: 3.9 g/dL (ref 3.5–5.0)
Alkaline Phosphatase: 75 U/L (ref 38–126)
Anion gap: 11 (ref 5–15)
BUN: 37 mg/dL — ABNORMAL HIGH (ref 8–23)
CO2: 24 mmol/L (ref 22–32)
Calcium: 9.5 mg/dL (ref 8.9–10.3)
Chloride: 111 mmol/L (ref 98–111)
Creatinine: 1.87 mg/dL — ABNORMAL HIGH (ref 0.61–1.24)
GFR, Estimated: 37 mL/min — ABNORMAL LOW (ref >60)
Glucose, Bld: 100 mg/dL — ABNORMAL HIGH (ref 70–99)
Potassium: 4.2 mmol/L (ref 3.5–5.1)
Sodium: 146 mmol/L — ABNORMAL HIGH (ref 135–145)
Total Bilirubin: 0.9 mg/dL (ref 0.3–1.2)
Total Protein: 6.8 g/dL (ref 6.5–8.1)

## 2021-07-15 LAB — CBC WITH DIFFERENTIAL/PLATELET
Abs Immature Granulocytes: 0.02 10*3/uL (ref 0.00–0.07)
Basophils Absolute: 0 10*3/uL (ref 0.0–0.1)
Basophils Relative: 1 %
Eosinophils Absolute: 0 10*3/uL (ref 0.0–0.5)
Eosinophils Relative: 1 %
HCT: 31.7 % — ABNORMAL LOW (ref 39.0–52.0)
Hemoglobin: 10.1 g/dL — ABNORMAL LOW (ref 13.0–17.0)
Immature Granulocytes: 1 %
Lymphocytes Relative: 11 %
Lymphs Abs: 0.5 10*3/uL — ABNORMAL LOW (ref 0.7–4.0)
MCH: 30.1 pg (ref 26.0–34.0)
MCHC: 31.9 g/dL (ref 30.0–36.0)
MCV: 94.6 fL (ref 80.0–100.0)
Monocytes Absolute: 0.6 10*3/uL (ref 0.1–1.0)
Monocytes Relative: 15 %
Neutro Abs: 3 10*3/uL (ref 1.7–7.7)
Neutrophils Relative %: 71 %
Platelets: 88 10*3/uL — ABNORMAL LOW (ref 150–400)
RBC: 3.35 MIL/uL — ABNORMAL LOW (ref 4.22–5.81)
RDW: 17.1 % — ABNORMAL HIGH (ref 11.5–15.5)
WBC: 4.2 10*3/uL (ref 4.0–10.5)
nRBC: 0 % (ref 0.0–0.2)

## 2021-07-15 MED ORDER — PAMIDRONATE DISODIUM 60 MG/10ML IV SOLN
60.0000 mg | Freq: Once | INTRAVENOUS | Status: AC
  Administered 2021-07-15: 60 mg via INTRAVENOUS
  Filled 2021-07-15: qty 10

## 2021-07-15 MED ORDER — SODIUM CHLORIDE 0.9 % IV SOLN: Freq: Once | INTRAVENOUS | Status: AC

## 2021-07-15 NOTE — Patient Instructions (Signed)
Pamidronate Injection What is this medication? PAMIDRONATE (pa mi DROE nate) slows calcium loss from bones. It treats Paget's disease and high calcium levels in the blood from some kinds of cancer. It maybe used in other people at risk for bone loss. This medicine may be used for other purposes; ask your health care provider orpharmacist if you have questions. COMMON BRAND NAME(S): Aredia What should I tell my care team before I take this medication? They need to know if you have any of these conditions: bleeding disorder cancer dental disease kidney disease low levels of calcium or other minerals in the blood low red blood cell counts receiving steroids like dexamethasone or prednisone an unusual or allergic reaction to pamidronate, other drugs, foods, dyes or preservatives pregnant or trying to get pregnant breast-feeding How should I use this medication? This drug is injected into a vein. It is given by a health care provider in [place] or clinic setting. Talk to your health care provider about the use of this drug in children.Special care may be needed. Overdosage: If you think you have taken too much of this medicine contact apoison control center or emergency room at once. NOTE: This medicine is only for you. Do not share this medicine with others. What if I miss a dose? Keep appointments for follow-up doses. It is important not to miss your dose.Call your health care provider if you are unable to keep an appointment. What may interact with this medication? certain antibiotics given by injection medicines for inflammation or pain like ibuprofen, naproxen some diuretics like bumetanide, furosemide cyclosporine parathyroid hormone tacrolimus teriparatide thalidomide This list may not describe all possible interactions. Give your health care provider a list of all the medicines, herbs, non-prescription drugs, or dietary supplements you use. Also tell them if you smoke, drink  alcohol, or use illegaldrugs. Some items may interact with your medicine. What should I watch for while using this medication? Visit your health care provider for regular checks on your progress. It may besome time before you see the benefit from this drug. Some people who take this drug have severe bone, joint, or muscle pain. This drug may also increase your risk for jaw problems or a broken thigh bone. Tell your health care provider right away if you have severe pain in your jaw, bones, joints, or muscles. Tell you health care provider if you have any painthat does not go away or that gets worse. Tell your dentist and dental surgeon that you are taking this drug. You should not have major dental surgery while on this drug. See your dentist to have a dental exam and fix any dental problems before starting this drug. Take good care of your teeth while on this drug. Make sure you see your dentist forregular follow-up appointments. You should make sure you get enough calcium and vitamin D while you are taking this drug. Discuss the foods you eat and the vitamins you take with your healthcare provider. You may need blood work done while you are taking this drug. Do not become pregnant while taking this drug. Women should inform their health care provider if they wish to become pregnant or think they might be pregnant. There is potential for serious harm to an unborn child. Talk to your healthcare provider for more information. What side effects may I notice from receiving this medication? Side effects that you should report to your doctor or health care provider assoon as possible: allergic reactions (skin rash, itching or hives; swelling  of the face, lips, or tongue) bleeding (bloody or black, tarry stools; red or dark brown urine; spitting up blood or brown material that looks like coffee grounds; red spots on the skin; unusual bruising or bleeding from the eyes, gums, or nose) bone pain increased  thirst infection (fever, chills, cough, sore throat, pain or trouble passing urine) jaw pain, especially after dental work joint pain kidney injury (trouble passing urine or change in the amount of urine) low calcium levels (fast heartbeat; muscle cramps or pain; pain, tingling, or numbness in the hands or feet; seizures) low magnesium levels (fast, irregular heartbeat; muscle cramp or pain; muscle weakness; tremors; seizures) low potassium levels (trouble breathing; chest pain; dizziness; fast, irregular heartbeat; feeling faint or lightheaded, falls; muscle cramps or pain) muscle pain pain, redness, or irritation at site where injected redness, blistering, peeling, or loosening of the skin, including inside the mouth severe diarrhea unusual sweating Side effects that usually do not require medical attention (report to yourdoctor or health care provider if they continue or are bothersome): constipation eye irritation, itching, or pain fever headache increase in blood pressure loss of appetite nausea stomach pain unusually weak or tired vomiting This list may not describe all possible side effects. Call your doctor for medical advice about side effects. You may report side effects to FDA at1-800-FDA-1088. Where should I keep my medication? This drug is given in a hospital or clinic. It will not be stored at home. NOTE: This sheet is a summary. It may not cover all possible information. If you have questions about this medicine, talk to your doctor, pharmacist, orhealth care provider.  2022 Elsevier/Gold Standard (2019-08-29 12:19:52)

## 2021-07-16 LAB — KAPPA/LAMBDA LIGHT CHAINS
Kappa free light chain: 104 mg/L — ABNORMAL HIGH (ref 3.3–19.4)
Kappa, lambda light chain ratio: 4.84 — ABNORMAL HIGH (ref 0.26–1.65)
Lambda free light chains: 21.5 mg/L (ref 5.7–26.3)

## 2021-07-17 ENCOUNTER — Other Ambulatory Visit: Payer: Self-pay | Admitting: Hematology

## 2021-07-17 DIAGNOSIS — C9000 Multiple myeloma not having achieved remission: Secondary | ICD-10-CM

## 2021-07-18 LAB — MULTIPLE MYELOMA PANEL, SERUM
Albumin SerPl Elph-Mcnc: 3.6 g/dL (ref 2.9–4.4)
Albumin/Glob SerPl: 1.4 (ref 0.7–1.7)
Alpha 1: 0.2 g/dL (ref 0.0–0.4)
Alpha2 Glob SerPl Elph-Mcnc: 0.6 g/dL (ref 0.4–1.0)
B-Globulin SerPl Elph-Mcnc: 0.9 g/dL (ref 0.7–1.3)
Gamma Glob SerPl Elph-Mcnc: 1.1 g/dL (ref 0.4–1.8)
Globulin, Total: 2.7 g/dL (ref 2.2–3.9)
IgA: 73 mg/dL (ref 61–437)
IgG (Immunoglobin G), Serum: 1113 mg/dL (ref 603–1613)
IgM (Immunoglobulin M), Srm: 21 mg/dL (ref 15–143)
Total Protein ELP: 6.3 g/dL (ref 6.0–8.5)

## 2021-07-21 ENCOUNTER — Encounter: Payer: Self-pay | Admitting: Hematology

## 2021-07-21 NOTE — Progress Notes (Signed)
HEMATOLOGY/ONCOLOGY CLINIC NOTE  Date of Service: .07/15/2021  Patient Care Team: Sandi Mariscal, MD as PCP - General (Internal Medicine) Lorretta Harp, MD as PCP - Cardiology (Cardiology)  CHIEF COMPLAINTS/PURPOSE OF CONSULTATION:   F/u for continued management of Myeloma   HISTORY OF PRESENTING ILLNESS:  plz see previous note for details on HPI  INTERVAL HISTORY: [name] is here for his scheduled followup for multiple myeloma. The patient's last visit with [name] was on 08/012022. The pt reports that he is doing well overall.  The pt reports persistent unchanged neuropathy and chronic back pain. No other acute new focal symptoms.  Lab results today 07/15/2021 -labs reviewed with the patient  PET CT scan done on 07/03/2021 shows:IMPRESSION: 1. The dominant previously intensely hypermetabolic lytic lesion in the left iliac bone adjacent to the SI joint is of similar size but markedly reduced activity, current SUV 3.2 versus previous SUV 28.4. 2. Scattered chronic rib fractures some of which may well be pathologic  in nature, with multifocal accentuated rib activity mostly similar to prior. One index rib lesion in the left lateral fifth rib has a maximum SUV of 4.4 (previously 3.5). 3. Other deformities such as sternal deformity and multiple thoracolumbar compression fractures may relate to the patient's myeloma but continued to have only moderate associated activity. 4. I do note a somewhat speckled lucency in the calvarium which could possibly be due to miniscule myeloma thus lesions, but not associated with substantial accentuated metabolic activity. 5. Other imaging findings of potential clinical significance: Possible early cirrhotic morphology of the liver. Chronic right ethmoid sinusitis. Aortic Atherosclerosis (ICD10-I70.0). Coronary atherosclerosis. Moderate cardiomegaly. Nonobstructive bilateral nephrolithiasis. Cholelithiasis. Bladder stone. Prostatomegaly. High density exophytic left kidney lower pole lesion is been present since 2012 and is likely a complex cyst.   No reported toxicity from NInlaro maintenance at this time.  On review of systems, pt reports no other acute new symptoms.   MEDICAL HISTORY:  Past Medical History:  Diagnosis Date   Arthritis    "left shoulder" (07/31/2016)   Coronary artery disease    GERD (gastroesophageal reflux disease)    Heart murmur    High cholesterol    Hypertension    Multiple myeloma ([place])    Sleep apnea    "probably; having test in November" (07/31/2016)   Type II diabetes mellitus ([place])     SURGICAL HISTORY: Past Surgical History:  Procedure Laterality Date   CARDIAC CATHETERIZATION N/A 07/31/2016   Procedure: Left Heart Cath and Coronary Angiography;  Surgeon: Lorretta Harp, MD;  Location: [facility];  Service: Cardiovascular;  Laterality: N/A;   CARDIAC CATHETERIZATION N/A 07/31/2016   Procedure: Coronary Stent Intervention;  Surgeon: Lorretta Harp, MD;  Location: [facility];  Service: Cardiovascular;   Laterality: N/A;   CORONARY ANGIOPLASTY     OPEN REDUCTION INTERNAL FIXATION (ORIF) METACARPAL Left 05/21/2018   Procedure: OPEN REDUCTION INTERNAL FIXATION (ORIF) 2ND METACARPAL;  Surgeon: Shona Needles, MD;  Location: [facility];  Service: Orthopedics;  Laterality: Left;   ORIF CLAVICULAR FRACTURE Right 05/21/2018   Procedure: OPEN REDUCTION INTERNAL FIXATION (ORIF) CLAVICULAR FRACTURE;  Surgeon: Shona Needles, MD;  Location: [facility];  Service: Orthopedics;  Laterality: Right;   SHOULDER SURGERY Left 1973   "put pin in it where it had separated"    TONSILLECTOMY  ~ 1956    SOCIAL HISTORY: Social History   Socioeconomic History   Marital status: Divorced  Spouse name: Not on file   Number of children: Not on file   Years of education: Not on file   Highest education level: Not on file  Occupational History   Not on file  Tobacco Use   Smoking status: Never   Smokeless tobacco: Never  Vaping Use   Vaping Use: Never used  Substance and Sexual Activity   Alcohol use: Not Currently    Comment: 07/31/2016 "nothing since 2002"   Drug use: No   Sexual activity: Not Currently  Other Topics Concern   Not on file  Social History Narrative   Not on file   Social Determinants of Health   Financial Resource Strain: Low Risk    Difficulty of Paying Living Expenses: Not hard at all  Food Insecurity: No Food Insecurity   Worried About Charity fundraiser in the Last Year: Never true   [place] in the Last Year: Never true  Transportation Needs: No Transportation Needs   Lack of Transportation (Medical): No   Lack of Transportation (Non-Medical): No  Physical Activity: Inactive   Days of Exercise per Week: 0 days   Minutes of Exercise per Session: 0 min  Stress: Not on file  Social Connections: Moderately Integrated   Frequency of Communication with Friends and Family: More than three times a week   Frequency of Social Gatherings with Friends and Family: More than three times a  week   Attends Religious Services: Never   Marine scientist or Organizations: Yes   Attends Archivist Meetings: Never   Marital Status: Living with partner  Intimate Partner Violence: Not At Risk   Fear of Current or Ex-Partner: No   Emotionally Abused: No   Physically Abused: No   Sexually Abused: No    FAMILY HISTORY: Family History  Problem Relation Age of Onset   Hypertension Other    Lung cancer Mother     ALLERGIES:  has No Known Allergies.  MEDICATIONS:  Current Outpatient Medications  Medication Sig Dispense Refill   acyclovir (ZOVIRAX) 400 MG tablet Take 1 tablet by mouth twice daily 60 tablet 0   amLODipine (NORVASC) 5 MG tablet Take 5 mg by mouth daily.     atorvastatin (LIPITOR) 40 MG tablet Take 1 tablet (40 mg total) by mouth at bedtime. 30 tablet 12   carvedilol (COREG) 12.5 MG tablet Take 12.5 mg by mouth 2 (two) times daily with a meal.     clopidogrel (PLAVIX) 75 MG tablet Take 1 tablet (75 mg total) by mouth daily with breakfast. 30 tablet 12   cyclobenzaprine (FLEXERIL) 10 MG tablet Take 10 mg by mouth daily as needed for muscle spasms.   2   escitalopram (LEXAPRO) 10 MG tablet Take 10 mg by mouth daily.     furosemide (LASIX) 80 MG tablet Take 1 tablet (80 mg total) by mouth in the morning AND 0.5 tablets (40 mg total) every evening. 135 tablet 3   isosorbide mononitrate (IMDUR) 30 MG 24 hr tablet Take 1 tablet (30 mg total) by mouth daily. 90 tablet 3   ixazomib citrate (NINLARO) 3 MG capsule TAKE 1 CAPSULE (3 MG TOTAL) BY MOUTH ONCE A WEEK. ON DAY 1,8,15 EVERY 28 DAYS. TAKE ON AN EMPTY STOMACH 1HR BEFORE OR 2HRS AFTER FOOD. 3 capsule 1   montelukast (SINGULAIR) 10 MG tablet Take 10 mg by mouth daily.     omeprazole (PRILOSEC) 40 MG capsule Take 40 mg by mouth daily  with breakfast.      oxyCODONE-acetaminophen (PERCOCET) 10-325 MG tablet Take 1 tablet by mouth 2 (two) times daily. 10 tablet 0   Potassium Chloride CR (MICRO-K) 8 MEQ CPCR  capsule CR Take 8 mEq by mouth daily.     SYMBICORT 160-4.5 MCG/ACT inhaler Inhale 2 puffs into the lungs 2 (two) times daily as needed (shortness of breath).      Vitamin D, Ergocalciferol, (DRISDOL) 1.25 MG (50000 UNIT) CAPS capsule Take 1 capsule by mouth once a week 12 capsule 0   warfarin (COUMADIN) 5 MG tablet Take 5 mg by mouth daily.     No current facility-administered medications for this visit.   Facility-Administered Medications Ordered in Other Visits  Medication Dose Route Frequency Provider Last Rate Last Admin   0.9 %  sodium chloride infusion   Intravenous Continuous Brunetta Genera, MD 10 mL/hr at 07/09/17 1423 New Bag at 07/09/17 1423    REVIEW OF SYSTEMS:   .10 Point review of Systems was done is negative except as noted above.   PHYSICAL EXAMINATION:  ECOG FS:2 - Symptomatic, <50% confined to bed  Vitals:   07/15/21 1043  BP: 130/77  Pulse: 75  Resp: 19  Temp: 98.7 F (37.1 C)  SpO2: 95%    Wt Readings from Last 3 Encounters:  07/15/21 223 lb 4.8 oz (101.3 kg)  05/20/21 222 lb 14.4 oz (101.1 kg)  04/15/21 224 lb 9.6 oz (101.9 kg)   Body mass index is 32.98 kg/m.    [name] GENERAL:alert, in no acute distress and comfortable SKIN: no acute rashes, no significant lesions EYES: conjunctiva are pink and non-injected, sclera anicteric OROPHARYNX: MMM, no exudates, no oropharyngeal erythema or ulceration NECK: supple, no JVD LYMPH:  no palpable lymphadenopathy in the cervical, axillary or inguinal regions LUNGS: clear to auscultation b/l with normal respiratory effort HEART: regular rate & rhythm ABDOMEN:  normoactive bowel sounds , non tender, not distended. Extremity: no pedal edema PSYCH: alert & oriented x 3 with fluent speech NEURO: no focal motor/sensory deficits   LABORATORY DATA:  I have reviewed the data as listed  CBC Latest Ref Rng & Units 07/15/2021 05/20/2021 04/15/2021  WBC 4.0 - 10.5 K/uL 4.2 5.0 3.6(L)  Hemoglobin 13.0 - 17.0 g/dL  10.1(L) 10.7(L) 10.4(L)  Hematocrit 39.0 - 52.0 % 31.7(L) 33.7(L) 33.2(L)  Platelets 150 - 400 K/uL 88(L) 101(L) 100(L)    CBC    Component Value Date/Time   WBC 4.2 07/15/2021 0942   RBC 3.35 (L) 07/15/2021 0942   HGB 10.1 (L) 07/15/2021 0942   HGB 10.7 (L) 05/20/2021 1017   HGB 9.2 (L) 10/01/2017 1112   HCT 31.7 (L) 07/15/2021 0942   HCT 29.3 (L) 10/01/2017 1112   PLT 88 (L) 07/15/2021 0942   PLT 101 (L) 05/20/2021 1017   PLT 170 10/01/2017 1112   MCV 94.6 07/15/2021 0942   MCV 88.5 10/01/2017 1112   MCH 30.1 07/15/2021 0942   MCHC 31.9 07/15/2021 0942   RDW 17.1 (H) 07/15/2021 0942   RDW 15.3 (H) 10/01/2017 1112   LYMPHSABS 0.5 (L) 07/15/2021 0942   LYMPHSABS 0.5 (L) 10/01/2017 1112   MONOABS 0.6 07/15/2021 0942   MONOABS 0.5 10/01/2017 1112   EOSABS 0.0 07/15/2021 0942   EOSABS 0.1 10/01/2017 1112   BASOSABS 0.0 07/15/2021 0942   BASOSABS 0.0 10/01/2017 1112    . CMP Latest Ref Rng & Units 07/15/2021 05/20/2021 04/15/2021  Glucose 70 - 99 mg/dL 100(H) 116(H) 107(H)  BUN  8 - 23 mg/dL 37(H) 43(H) 34(H)  Creatinine 0.61 - 1.24 mg/dL 1.87(H) 2.24(H) 1.82(H)  Sodium 135 - 145 mmol/L 146(H) 143 144  Potassium 3.5 - 5.1 mmol/L 4.2 3.9 4.0  Chloride 98 - 111 mmol/L 111 107 108  CO2 22 - 32 mmol/L '24 28 24  ' Calcium 8.9 - 10.3 mg/dL 9.5 9.1 9.1  Total Protein 6.5 - 8.1 g/dL 6.8 6.9 6.7  Total Bilirubin 0.3 - 1.2 mg/dL 0.9 0.9 1.0  Alkaline Phos 38 - 126 U/L 75 75 76  AST 15 - 41 U/L '23 21 23  ' ALT 0 - 44 U/L '14 13 14   ' 03/18/2019 MMP: Component     Latest Ref Rng & Units 03/18/2019  IgG (Immunoglobin G), Serum     603 - 1,613 mg/dL 1,012  IgA     61 - 437 mg/dL 51 (L)  IgM (Immunoglobulin M), Srm     15 - 143 mg/dL 27  Total Protein ELP     6.0 - 8.5 g/dL 6.3  Albumin SerPl Elph-Mcnc     2.9 - 4.4 g/dL 3.8  Alpha 1     0.0 - 0.4 g/dL 0.2  Alpha2 Glob SerPl Elph-Mcnc     0.4 - 1.0 g/dL 0.7  B-Globulin SerPl Elph-Mcnc     0.7 - 1.3 g/dL 0.8  Gamma Glob SerPl  Elph-Mcnc     0.4 - 1.8 g/dL 0.8  M Protein SerPl Elph-Mcnc     Not Observed g/dL Not Observed  Globulin, Total     2.2 - 3.9 g/dL 2.5  Albumin/Glob SerPl     0.7 - 1.7 1.6  IFE 1      Comment  Please Note (HCV):      Comment    RADIOGRAPHIC STUDIES: I have personally reviewed the radiological images as listed and agreed with the findings in the report. NM PET Image Restage (PS) Whole Body  Result Date: 07/04/2021 CLINICAL DATA:  Subsequent treatment strategy for multiple myeloma. EXAM: NUCLEAR MEDICINE PET WHOLE BODY TECHNIQUE: 11.1 mCi F-18 FDG was injected intravenously. Full-ring PET imaging was performed from the head to foot after the radiotracer. CT data was obtained and used for attenuation correction and anatomic localization. Fasting blood glucose: 111 mg/dl COMPARISON:  Multiple exams, including 11/19/2020 FINDINGS: Mediastinal blood pool activity: SUV max 3.2 HEAD/NECK: No significant abnormal hypermetabolic activity in this region. Incidental CT findings: Diffuse opacity in the right ethmoid air cells without well-defined separation of the air cells, similar to the prior exam, possibly from chronic ethmoid sinusitis or chronic postoperative findings in the region. Bilateral common carotid atherosclerotic calcification. CHEST: No significant abnormal hypermetabolic activity in this region. Incidental CT findings: Coronary, aortic arch, and branch vessel atherosclerotic vascular disease. Moderate cardiomegaly. ABDOMEN/PELVIS: Physiologic activity in bowel. Incidental CT findings: Mild lobularity of the liver margin, cannot exclude early cirrhosis. Substantial atherosclerosis including the abdominal aorta. Nonobstructive bilateral nephrolithiasis. Gallstones are observed in the gallbladder. Transverse duodenal diverticula without findings of inflammation. 1.0 cm bladder stone. Prostatomegaly. Homogeneous high density exophytic lesion from the left kidney lower pole measuring 2.3 by 2.0  cm, internal density 62 Hounsfield units, not appreciably hypermetabolic, probably but not definitively a complex cyst. This has been present on prior exams back through 11/03/2010. Other hypodense renal lesions are probably cysts. SKELETON: Index lesion in the left iliac bone adjacent to the SI joint measures 2.3 by 1.7 cm with maximum SUV of 3.2, this previously measured 2.3 by 1.5 cm with maximum SUV of  28.4. The lucent lesion in the right iliac wing is mostly fatty density and not hypermetabolic, measuring 4.0 by 2.3 cm on image 181 series 4. Speckled diffuse lucencies throughout the calvarium are not entirely specific but may be due to myeloma. Again noted are numerous mildly sclerotic bilateral rib fractures demonstrating hypermetabolic activity and some with a fairly elongated appearance, the index left lateral fifth rib expansion and deformity has a maximum SUV of 4.4 (previously 3.5). The overall distribution of rib lesions appears generally similar to prior. Sternal deformity and sclerosis in the sternal body from old fracture with similar morphology to prior, maximum SUV 4.3 (previously the same). Multiple lower thoracic and lumbar wedge compression fractures are similar to prior, with associated mostly low-grade metabolic activity. Activity at the L4 wedge compression has a maximum SUV of 6.5, previously 4.8. Activity along the joint capsules of the shoulders and right hip is likely reactive/physiologic. Incidental CT findings: Thoracic spondylosis. Postoperative findings in the right clavicle. Fragmented spur along the left medial clavicular head unchanged. Screw in the left proximal humerus. EXTREMITIES: Muscular activity in the vicinity of the right elbow, physiologic. Incidental CT findings: Atherosclerosis. Trace right knee joint effusion. IMPRESSION: 1. The dominant previously intensely hypermetabolic lytic lesion in the left iliac bone adjacent to the SI joint is of similar size but markedly  reduced activity, current SUV 3.2 versus previous SUV 28.4. 2. Scattered chronic rib fractures some of which may well be pathologic in nature, with multifocal accentuated rib activity mostly similar to prior. One index rib lesion in the left lateral fifth rib has a maximum SUV of 4.4 (previously 3.5). 3. Other deformities such as sternal deformity and multiple thoracolumbar compression fractures may relate to the patient's myeloma but continued to have only moderate associated activity. 4. I do note a somewhat speckled lucency in the calvarium which could possibly be due to miniscule myeloma thus lesions, but not associated with substantial accentuated metabolic activity. 5. Other imaging findings of potential clinical significance: Possible early cirrhotic morphology of the liver. Chronic right ethmoid sinusitis. Aortic Atherosclerosis (ICD10-I70.0). Coronary atherosclerosis. Moderate cardiomegaly. Nonobstructive bilateral nephrolithiasis. Cholelithiasis. Bladder stone. Prostatomegaly. High density exophytic left kidney lower pole lesion is been present since 2012 and is likely a complex cyst. Electronically Signed   By: Van Clines M.D.   On: 07/04/2021 10:29    ASSESSMENT & PLAN:   75 y.o. 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   K/L 95, No M spike on SPEP -- suggests light chain MM Cytogenetics - normal male chromosomes FISH- +11 and 13q-/-13  Patient is s/p 1 cycle of Vd  and Serum free kappa LC has decreased from 700 to 203.6 with improvement in his K/L ratio from 94.59 to 29. Completed 2nd cycle of treatment with Vd + Cytoxan (243m/m2) Completed 3rd cycle of treatment with Vd + Cytoxan (2080mm2) - cytoxan held D15 due to thrombocytopenia Completed 4th of VCd Discontinued VCd after C5D8 due to intolerance with diarrhea and increasing neuropathy and fatigue with borderline functional status. -Patient did not tolerate maintenance Revlimid  10 mg by mouth daily due to grade 2-3 diarrhea. This was discontinued and his diarrhea has resolved.  Currently on maintenance Ninlaro M spike absent   11/15/18 CT Pelvic revealed  3 mm stone in the right UVJ. 5 x 6 mm stone is identified in the left UVJ. No associated distal hydroureter. 2. Stable lucent lesion in the right iliac bone. 3. Small right groin hernia contains only fat. 11/15/18 CT Lumbar revealed  No acute fracture or malalignment. 2. Severe osteopenia and multiple old compression fractures, severe at T11 and T12. 3. Severe canal stenosis L3-4. Multilevel neural foraminal narrowing: Moderate to severe at T12-L1. 4. Slowly enlarging solid LEFT renal mass. Recommend non emergent MRI renal mass protocol on non emergent basis -No obvious new myeloma involvement seen in most recent CT imaging.         2) CAD s/p prox LAD DES PCI on 07/31/2016 with ischemic cardiomyopathy ejection fraction of 25-35%. Rpt ECHO on 10/03/2016 shows improvement in EF to 55-60% with no RWMABN.   3) Macrocytic Anemia with moderate thrombocytopenia - due to MM and and Ninlaro. Stable.  4) Thrombocytopenia - due to MM and treatment --improved today 108k. --will monitor   5) B12 deficiency - Plan -cont SL B12 1067mg po daily   6) h/o Afib with RVR on coumadin Some bradycardia with BB -continue mx per PCP and cardiology  7) Back pain -- multiple chronic compression fractures with some worsening -On bisphosphonates already -Ergocalciferol  50k units weekly -Advised that the pt not lift anything heavy  -Discussed the 11/15/18 CT imaging, as noted above, did refer pt to orthopedist -Pt has been evaluated by orthopedist in December 2020  PLAN:  -Discussed pt labwork today, 07/15/2021; labs reivewed with patient. -discussed PET/CT from 07/03/2021 -no acute new lesions.  Resolving previous lesions. -no overt progression of myeloma based on clinical evaluation and labs and PET CT scan -The pt has no prohibitive toxicities from continuing Ninlaro at this time. -Continue Aredia q8weeks. -Continue weekly Ergocalciferol.  FOLLOW UP: Please schedule next dose of Aredia with labs and MD visit in 2 months  . The total time spent in the appointment was 32 minutes and more than 50% was on counseling and direct patient cares., ordering and mx of chemotherapy.  All of the patient's questions were answered with apparent satisfaction. The patient knows to call the clinic with any problems, questions or concerns.   GSullivan LoneMD MSanfordAAHIVMS SRangely District HospitalCBaptist Health Surgery CenterHematology/Oncology Physician CSilver [facility]

## 2021-07-29 ENCOUNTER — Other Ambulatory Visit: Payer: Self-pay | Admitting: Hematology

## 2021-07-29 ENCOUNTER — Other Ambulatory Visit (HOSPITAL_COMMUNITY): Payer: Self-pay

## 2021-07-29 DIAGNOSIS — C9000 Multiple myeloma not having achieved remission: Secondary | ICD-10-CM

## 2021-07-30 ENCOUNTER — Encounter: Payer: Self-pay | Admitting: Hematology

## 2021-07-30 ENCOUNTER — Other Ambulatory Visit (HOSPITAL_COMMUNITY): Payer: Self-pay

## 2021-07-30 MED ORDER — IXAZOMIB CITRATE 3 MG PO CAPS
ORAL_CAPSULE | ORAL | 1 refills | Status: DC
  Filled 2021-07-30: qty 3, 28d supply, fill #0
  Filled 2021-08-27: qty 3, 28d supply, fill #1

## 2021-07-31 ENCOUNTER — Other Ambulatory Visit (HOSPITAL_COMMUNITY): Payer: Self-pay

## 2021-08-10 ENCOUNTER — Encounter (HOSPITAL_COMMUNITY): Payer: Self-pay | Admitting: Emergency Medicine

## 2021-08-10 ENCOUNTER — Emergency Department (HOSPITAL_COMMUNITY): Payer: Medicare HMO

## 2021-08-10 ENCOUNTER — Other Ambulatory Visit: Payer: Self-pay

## 2021-08-10 ENCOUNTER — Emergency Department (HOSPITAL_COMMUNITY)
Admission: EM | Admit: 2021-08-10 | Discharge: 2021-08-10 | Disposition: A | Discharge status: Home / Self Care | Payer: Medicare HMO | Attending: Emergency Medicine | Admitting: Emergency Medicine

## 2021-08-10 DIAGNOSIS — Z20822 Contact with and (suspected) exposure to covid-19: Secondary | ICD-10-CM | POA: Insufficient documentation

## 2021-08-10 DIAGNOSIS — I4891 Unspecified atrial fibrillation: Secondary | ICD-10-CM | POA: Insufficient documentation

## 2021-08-10 DIAGNOSIS — R1033 Periumbilical pain: Secondary | ICD-10-CM | POA: Diagnosis present

## 2021-08-10 DIAGNOSIS — M7981 Nontraumatic hematoma of soft tissue: Secondary | ICD-10-CM | POA: Insufficient documentation

## 2021-08-10 DIAGNOSIS — R0600 Dyspnea, unspecified: Secondary | ICD-10-CM | POA: Insufficient documentation

## 2021-08-10 DIAGNOSIS — Z7901 Long term (current) use of anticoagulants: Secondary | ICD-10-CM | POA: Insufficient documentation

## 2021-08-10 DIAGNOSIS — T148XXA Other injury of unspecified body region, initial encounter: Secondary | ICD-10-CM

## 2021-08-10 DIAGNOSIS — I10 Essential (primary) hypertension: Secondary | ICD-10-CM | POA: Diagnosis not present

## 2021-08-10 LAB — CBC WITH DIFFERENTIAL/PLATELET
Abs Immature Granulocytes: 0.02 10*3/uL (ref 0.00–0.07)
Basophils Absolute: 0 10*3/uL (ref 0.0–0.1)
Basophils Relative: 0 %
Eosinophils Absolute: 0 10*3/uL (ref 0.0–0.5)
Eosinophils Relative: 1 %
HCT: 32.5 % — ABNORMAL LOW (ref 39.0–52.0)
Hemoglobin: 10 g/dL — ABNORMAL LOW (ref 13.0–17.0)
Immature Granulocytes: 0 %
Lymphocytes Relative: 9 %
Lymphs Abs: 0.4 10*3/uL — ABNORMAL LOW (ref 0.7–4.0)
MCH: 29.6 pg (ref 26.0–34.0)
MCHC: 30.8 g/dL (ref 30.0–36.0)
MCV: 96.2 fL (ref 80.0–100.0)
Monocytes Absolute: 0.5 10*3/uL (ref 0.1–1.0)
Monocytes Relative: 11 %
Neutro Abs: 3.6 10*3/uL (ref 1.7–7.7)
Neutrophils Relative %: 79 %
Platelets: 94 10*3/uL — ABNORMAL LOW (ref 150–400)
RBC: 3.38 MIL/uL — ABNORMAL LOW (ref 4.22–5.81)
RDW: 17.2 % — ABNORMAL HIGH (ref 11.5–15.5)
WBC: 4.6 10*3/uL (ref 4.0–10.5)
nRBC: 0 % (ref 0.0–0.2)

## 2021-08-10 LAB — COMPREHENSIVE METABOLIC PANEL
ALT: 14 U/L (ref 0–44)
AST: 24 U/L (ref 15–41)
Albumin: 3.9 g/dL (ref 3.5–5.0)
Alkaline Phosphatase: 82 U/L (ref 38–126)
Anion gap: 7 (ref 5–15)
BUN: 35 mg/dL — ABNORMAL HIGH (ref 8–23)
CO2: 25 mmol/L (ref 22–32)
Calcium: 9 mg/dL (ref 8.9–10.3)
Chloride: 108 mmol/L (ref 98–111)
Creatinine, Ser: 1.67 mg/dL — ABNORMAL HIGH (ref 0.61–1.24)
GFR, Estimated: 43 mL/min — ABNORMAL LOW (ref >60)
Glucose, Bld: 115 mg/dL — ABNORMAL HIGH (ref 70–99)
Potassium: 4 mmol/L (ref 3.5–5.1)
Sodium: 140 mmol/L (ref 135–145)
Total Bilirubin: 0.9 mg/dL (ref 0.3–1.2)
Total Protein: 7.2 g/dL (ref 6.5–8.1)

## 2021-08-10 LAB — RESP PANEL BY RT-PCR (FLU A&B, COVID) ARPGX2
Influenza A by PCR: NEGATIVE
Influenza B by PCR: NEGATIVE
SARS Coronavirus 2 by RT PCR: NEGATIVE

## 2021-08-10 LAB — PROTIME-INR
INR: 2.5 — ABNORMAL HIGH (ref 0.8–1.2)
Prothrombin Time: 26.7 seconds — ABNORMAL HIGH (ref 11.4–15.2)

## 2021-08-10 LAB — LACTATE DEHYDROGENASE: LDH: 207 U/L — ABNORMAL HIGH (ref 98–192)

## 2021-08-10 LAB — BRAIN NATRIURETIC PEPTIDE: B Natriuretic Peptide: 846.2 pg/mL — ABNORMAL HIGH (ref 0.0–100.0)

## 2021-08-10 MED ORDER — IOHEXOL 350 MG/ML SOLN
80.0000 mL | Freq: Once | INTRAVENOUS | Status: AC | PRN
  Administered 2021-08-10: 60 mL via INTRAVENOUS

## 2021-08-10 NOTE — ED Triage Notes (Signed)
Patient complains of SOB that is worse than normal, has had new onset abdominal bruising and swelling. Gets SOB and dyspneic w/ any ambulation. No swelling in legs. On warfarin and plavix.

## 2021-08-10 NOTE — Discharge Instructions (Addendum)
Please follow up with cardiologist and primary care doctor early next week.
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
## 2021-08-26 ENCOUNTER — Other Ambulatory Visit: Payer: Self-pay | Admitting: Hematology

## 2021-08-26 DIAGNOSIS — C9000 Multiple myeloma not having achieved remission: Secondary | ICD-10-CM

## 2021-08-27 ENCOUNTER — Other Ambulatory Visit (HOSPITAL_COMMUNITY): Payer: Self-pay

## 2021-08-29 ENCOUNTER — Other Ambulatory Visit (HOSPITAL_COMMUNITY): Payer: Self-pay

## 2021-08-31 ENCOUNTER — Other Ambulatory Visit: Payer: Self-pay | Admitting: Hematology

## 2021-09-02 ENCOUNTER — Encounter: Payer: Self-pay | Admitting: Hematology

## 2021-09-11 ENCOUNTER — Other Ambulatory Visit: Payer: Self-pay

## 2021-09-11 DIAGNOSIS — C9000 Multiple myeloma not having achieved remission: Secondary | ICD-10-CM

## 2021-09-16 ENCOUNTER — Inpatient Hospital Stay: Payer: Medicare HMO

## 2021-09-16 ENCOUNTER — Other Ambulatory Visit: Payer: Self-pay

## 2021-09-16 ENCOUNTER — Inpatient Hospital Stay: Payer: Medicare HMO | Attending: Hematology

## 2021-09-16 ENCOUNTER — Inpatient Hospital Stay (HOSPITAL_BASED_OUTPATIENT_CLINIC_OR_DEPARTMENT_OTHER): Payer: Medicare HMO | Admitting: Hematology

## 2021-09-16 VITALS — BP 117/60 | HR 61 | Temp 97.2°F | Resp 18 | Wt 223.2 lb

## 2021-09-16 DIAGNOSIS — C9000 Multiple myeloma not having achieved remission: Secondary | ICD-10-CM

## 2021-09-16 DIAGNOSIS — Z7189 Other specified counseling: Secondary | ICD-10-CM

## 2021-09-16 LAB — CMP (CANCER CENTER ONLY)
ALT: 13 U/L (ref 0–44)
AST: 21 U/L (ref 15–41)
Albumin: 3.7 g/dL (ref 3.5–5.0)
Alkaline Phosphatase: 77 U/L (ref 38–126)
Anion gap: 10 (ref 5–15)
BUN: 30 mg/dL — ABNORMAL HIGH (ref 8–23)
CO2: 24 mmol/L (ref 22–32)
Calcium: 9.1 mg/dL (ref 8.9–10.3)
Chloride: 110 mmol/L (ref 98–111)
Creatinine: 1.77 mg/dL — ABNORMAL HIGH (ref 0.61–1.24)
GFR, Estimated: 40 mL/min — ABNORMAL LOW (ref >60)
Glucose, Bld: 99 mg/dL (ref 70–99)
Potassium: 3.7 mmol/L (ref 3.5–5.1)
Sodium: 144 mmol/L (ref 135–145)
Total Bilirubin: 1.1 mg/dL (ref 0.3–1.2)
Total Protein: 6.6 g/dL (ref 6.5–8.1)

## 2021-09-16 LAB — CBC WITH DIFFERENTIAL (CANCER CENTER ONLY)
Abs Immature Granulocytes: 0.02 10*3/uL (ref 0.00–0.07)
Basophils Absolute: 0 10*3/uL (ref 0.0–0.1)
Basophils Relative: 0 %
Eosinophils Absolute: 0 10*3/uL (ref 0.0–0.5)
Eosinophils Relative: 1 %
HCT: 31.1 % — ABNORMAL LOW (ref 39.0–52.0)
Hemoglobin: 9.4 g/dL — ABNORMAL LOW (ref 13.0–17.0)
Immature Granulocytes: 1 %
Lymphocytes Relative: 12 %
Lymphs Abs: 0.3 10*3/uL — ABNORMAL LOW (ref 0.7–4.0)
MCH: 29 pg (ref 26.0–34.0)
MCHC: 30.2 g/dL (ref 30.0–36.0)
MCV: 96 fL (ref 80.0–100.0)
Monocytes Absolute: 0.3 10*3/uL (ref 0.1–1.0)
Monocytes Relative: 13 %
Neutro Abs: 2 10*3/uL (ref 1.7–7.7)
Neutrophils Relative %: 73 %
Platelet Count: 50 10*3/uL — ABNORMAL LOW (ref 150–400)
RBC: 3.24 MIL/uL — ABNORMAL LOW (ref 4.22–5.81)
RDW: 17.2 % — ABNORMAL HIGH (ref 11.5–15.5)
WBC Count: 2.7 10*3/uL — ABNORMAL LOW (ref 4.0–10.5)
nRBC: 0 % (ref 0.0–0.2)

## 2021-09-16 MED ORDER — SODIUM CHLORIDE 0.9 % IV SOLN
60.0000 mg | Freq: Once | INTRAVENOUS | Status: AC
  Administered 2021-09-16: 12:00:00 60 mg via INTRAVENOUS
  Filled 2021-09-16: qty 10

## 2021-09-16 NOTE — Patient Instructions (Signed)
Pamidronate Injection ?What is this medication? ?PAMIDRONATE (pa mi DROE nate) slows calcium loss from bones. It treats Paget's disease and high calcium levels in the blood from some kinds of cancer. It may be used in other people at risk for bone loss. ?This medicine may be used for other purposes; ask your health care provider or pharmacist if you have questions. ?COMMON BRAND NAME(S): Aredia ?What should I tell my care team before I take this medication? ?They need to know if you have any of these conditions: ?bleeding disorder ?cancer ?dental disease ?kidney disease ?low levels of calcium or other minerals in the blood ?low red blood cell counts ?receiving steroids like dexamethasone or prednisone ?an unusual or allergic reaction to pamidronate, other drugs, foods, dyes or preservatives ?pregnant or trying to get pregnant ?breast-feeding ?How should I use this medication? ?This drug is injected into a vein. It is given by a health care provider in a hospital or clinic setting. ?Talk to your health care provider about the use of this drug in children. Special care may be needed. ?Overdosage: If you think you have taken too much of this medicine contact a poison control center or emergency room at once. ?NOTE: This medicine is only for you. Do not share this medicine with others. ?What if I miss a dose? ?Keep appointments for follow-up doses. It is important not to miss your dose. Call your health care provider if you are unable to keep an appointment. ?What may interact with this medication? ?certain antibiotics given by injection ?medicines for inflammation or pain like ibuprofen, naproxen ?some diuretics like bumetanide, furosemide ?cyclosporine ?parathyroid hormone ?tacrolimus ?teriparatide ?thalidomide ?This list may not describe all possible interactions. Give your health care provider a list of all the medicines, herbs, non-prescription drugs, or dietary supplements you use. Also tell them if you smoke,  drink alcohol, or use illegal drugs. Some items may interact with your medicine. ?What should I watch for while using this medication? ?Visit your health care provider for regular checks on your progress. It may be some time before you see the benefit from this drug. ?Some people who take this drug have severe bone, joint, or muscle pain. This drug may also increase your risk for jaw problems or a broken thigh bone. Tell your health care provider right away if you have severe pain in your jaw, bones, joints, or muscles. Tell you health care provider if you have any pain that does not go away or that gets worse. ?Tell your dentist and dental surgeon that you are taking this drug. You should not have major dental surgery while on this drug. See your dentist to have a dental exam and fix any dental problems before starting this drug. Take good care of your teeth while on this drug. Make sure you see your dentist for regular follow-up appointments. ?You should make sure you get enough calcium and vitamin D while you are taking this drug. Discuss the foods you eat and the vitamins you take with your health care provider. ?You may need blood work done while you are taking this drug. ?Do not become pregnant while taking this drug. Women should inform their health care provider if they wish to become pregnant or think they might be pregnant. There is potential for serious harm to an unborn child. Talk to your health care provider for more information. ?What side effects may I notice from receiving this medication? ?Side effects that you should report to your doctor or health care   provider as soon as possible: ?allergic reactions (skin rash, itching or hives; swelling of the face, lips, or tongue) ?bleeding (bloody or black, tarry stools; red or dark brown urine; spitting up blood or brown material that looks like coffee grounds; red spots on the skin; unusual bruising or bleeding from the eyes, gums, or nose) ?bone  pain ?increased thirst ?infection (fever, chills, cough, sore throat, pain or trouble passing urine) ?jaw pain, especially after dental work ?joint pain ?kidney injury (trouble passing urine or change in the amount of urine) ?low calcium levels (fast heartbeat; muscle cramps or pain; pain, tingling, or numbness in the hands or feet; seizures) ?low magnesium levels (fast, irregular heartbeat; muscle cramp or pain; muscle weakness; tremors; seizures) ?low potassium levels (trouble breathing; chest pain; dizziness; fast, irregular heartbeat; feeling faint or lightheaded, falls; muscle cramps or pain) ?muscle pain ?pain, redness, or irritation at site where injected ?redness, blistering, peeling, or loosening of the skin, including inside the mouth ?severe diarrhea ?unusual sweating ?Side effects that usually do not require medical attention (report to your doctor or health care provider if they continue or are bothersome): ?constipation ?eye irritation, itching, or pain ?fever ?headache ?increase in blood pressure ?loss of appetite ?nausea ?stomach pain ?unusually weak or tired ?vomiting ?This list may not describe all possible side effects. Call your doctor for medical advice about side effects. You may report side effects to FDA at 1-800-FDA-1088. ?Where should I keep my medication? ?This drug is given in a hospital or clinic. It will not be stored at home. ?NOTE: This sheet is a summary. It may not cover all possible information. If you have questions about this medicine, talk to your doctor, pharmacist, or health care provider. ?? 2022 Elsevier/Gold Standard (2021-06-25 00:00:00) ? ?

## 2021-09-16 NOTE — Progress Notes (Signed)
HEMATOLOGY/ONCOLOGY CLINIC NOTE  Date of Service: 09/16/2021  Patient Care Team: Sandi Mariscal, MD as PCP - General (Internal Medicine) Lorretta Harp, MD as PCP - Cardiology (Cardiology)  CHIEF COMPLAINTS/PURPOSE OF CONSULTATION:   F/u for continued management of Myeloma   HISTORY OF PRESENTING ILLNESS:  plz see previous note for details on HPI  INTERVAL HISTORY:  Richard Vang is here for follow-up of his multiple myeloma.  His last visit with [name] was on 07/15/2021. He reports chronic back pains without any localized focal new bone pains. No new dental issues. Continues to have chronic fatigue from his multiple medical issues. No leg swelling.  Patient notes no acute new changes or toxicities from his maintenance Ninlaro.  He reports he has been feeling very fatigued for the past 2 weeks. He had a hard time walking from the desk to the exam room. He has not had Covid-19 or the flu and has not changed any of his medications. He denies bleeding issues.  He recently started taking Vitamin D but did not start Vitamin B  complex.  Patient received evusheld injection on 05/20/2021.  There has been no changes in his neuropathy. He reports his back pain is still the same but his bones have been sore. He is not taking any OTC medications to manage the pain.  He is good from a cardiology standpoint and has an appointment next month.  He has been eating properly and has had no recent falls.  No new mouth sores/ulcers or skin rashes.  He uses his walker/cane when outside but ambulates at home without help.   MEDICAL HISTORY:  Past Medical History:  Diagnosis Date   Arthritis    "left shoulder" (07/31/2016)   Coronary artery disease    GERD (gastroesophageal reflux disease)    Heart murmur    High cholesterol    Hypertension    Multiple myeloma ([place])    Sleep apnea    "probably; having test in November" (07/31/2016)   Type II diabetes mellitus ([place])     SURGICAL HISTORY: Past Surgical History:  Procedure Laterality Date   CARDIAC CATHETERIZATION N/A 07/31/2016   Procedure: Left Heart Cath and Coronary Angiography;  Surgeon: Lorretta Harp, MD;  Location: [facility];  Service: Cardiovascular;  Laterality: N/A;   CARDIAC CATHETERIZATION N/A 07/31/2016   Procedure: Coronary Stent Intervention;  Surgeon: Lorretta Harp, MD;  Location: [facility];  Service: Cardiovascular;  Laterality: N/A;   CORONARY ANGIOPLASTY     OPEN REDUCTION INTERNAL FIXATION (ORIF) METACARPAL Left 05/21/2018   Procedure: OPEN REDUCTION INTERNAL FIXATION (ORIF) 2ND METACARPAL;  Surgeon: Shona Needles, MD;  Location: [facility];  Service: Orthopedics;  Laterality: Left;   ORIF CLAVICULAR FRACTURE Right 05/21/2018   Procedure: OPEN REDUCTION INTERNAL FIXATION (ORIF) CLAVICULAR FRACTURE;  Surgeon: Shona Needles, MD;  Location: [facility];  Service: Orthopedics;  Laterality: Right;   SHOULDER SURGERY Left 1973   "put pin in it where it had separated"    TONSILLECTOMY  ~ 1956    SOCIAL HISTORY: Social History    Socioeconomic History   Marital status: Divorced    Spouse name: Not on file   Number of children: Not on file   Years of education: Not on file   Highest education level: Not on file  Occupational History   Not on file  Tobacco Use   Smoking status: Never   Smokeless tobacco: Never  Vaping Use   Vaping  Use: Never used  Substance and Sexual Activity   Alcohol use: Not Currently    Comment: 07/31/2016 "nothing since 2002"   Drug use: No   Sexual activity: Not Currently  Other Topics Concern   Not on file  Social History Narrative   Not on file   Social Determinants of Health   Financial Resource Strain: Low Risk    Difficulty of Paying Living Expenses: Not hard at all  Food Insecurity: No Food Insecurity   Worried About Charity fundraiser in the Last Year: Never true   [place] in the Last Year: Never true  Transportation Needs: No Transportation Needs   Lack of Transportation (Medical): No   Lack of Transportation (Non-Medical): No  Physical Activity: Inactive   Days of Exercise per Week: 0 days   Minutes of Exercise per Session: 0 min  Stress: Not on file  Social Connections: Moderately Integrated   Frequency of Communication with Friends and Family: More than three times a week   Frequency of Social Gatherings with Friends and Family: More than three times a week   Attends Religious Services: Never   Marine scientist or Organizations: Yes   Attends Archivist Meetings: Never   Marital Status: Living with partner  Intimate Partner Violence: Not At Risk   Fear of Current or Ex-Partner: No   Emotionally Abused: No   Physically Abused: No   Sexually Abused: No    FAMILY HISTORY: Family History  Problem Relation Age of Onset   Hypertension Other    Lung cancer Mother     ALLERGIES:  has No Known Allergies.  MEDICATIONS:  Current Outpatient Medications  Medication Sig Dispense Refill   acyclovir (ZOVIRAX) 400 MG tablet Take 1  tablet by mouth twice daily 60 tablet 0   amLODipine (NORVASC) 5 MG tablet Take 5 mg by mouth daily.     atorvastatin (LIPITOR) 40 MG tablet Take 1 tablet (40 mg total) by mouth at bedtime. 30 tablet 12   carvedilol (COREG) 12.5 MG tablet Take 12.5 mg by mouth 2 (two) times daily with a meal.     clopidogrel (PLAVIX) 75 MG tablet Take 1 tablet (75 mg total) by mouth daily with breakfast. 30 tablet 12   cyclobenzaprine (FLEXERIL) 10 MG tablet Take 10 mg by mouth daily as needed for muscle spasms.   2   escitalopram (LEXAPRO) 10 MG tablet Take 10 mg by mouth daily.     furosemide (LASIX) 80 MG tablet Take 1 tablet (80 mg total) by mouth in the morning AND 0.5 tablets (40 mg total) every evening. 135 tablet 3   isosorbide mononitrate (IMDUR) 30 MG 24 hr tablet Take 1 tablet (30 mg total) by mouth daily. 90 tablet 3   ixazomib citrate (NINLARO) 3 MG capsule TAKE 1 CAPSULE (3 MG TOTAL) BY MOUTH ONCE A WEEK. ON DAY 1,8,15 EVERY 28 DAYS. TAKE ON AN EMPTY STOMACH 1HR BEFORE OR 2HRS AFTER FOOD. 3 capsule 1   montelukast (SINGULAIR) 10 MG tablet Take 10 mg by mouth daily.     omeprazole (PRILOSEC) 40 MG capsule Take 40 mg by mouth daily with breakfast.      oxyCODONE-acetaminophen (PERCOCET) 10-325 MG tablet Take 1 tablet by mouth 2 (two) times daily. 10 tablet 0   Potassium Chloride CR (MICRO-K) 8 MEQ CPCR capsule CR Take 8 mEq by mouth daily.     SYMBICORT 160-4.5 MCG/ACT inhaler Inhale 2 puffs into the lungs 2 (  two) times daily as needed (shortness of breath).      Vitamin D, Ergocalciferol, (DRISDOL) 1.25 MG (50000 UNIT) CAPS capsule Take 1 capsule by mouth once a week 12 capsule 0   warfarin (COUMADIN) 5 MG tablet Take 5 mg by mouth daily.     No current facility-administered medications for this visit.   Facility-Administered Medications Ordered in Other Visits  Medication Dose Route Frequency Provider Last Rate Last Admin   0.9 %  sodium chloride infusion   Intravenous Continuous Brunetta Genera, MD 10 mL/hr at 07/09/17 1423 New Bag at 07/09/17 1423    REVIEW OF SYSTEMS:   (+) Fatigue (+) Myalgias .10 Point review of Systems was done is negative except as noted above.   PHYSICAL EXAMINATION:  ECOG FS:2 - Symptomatic, <50% confined to bed  There were no vitals filed for this visit.   Wt Readings from Last 3 Encounters:  08/10/21 224 lb 13.9 oz (102 kg)  07/15/21 223 lb 4.8 oz (101.3 kg)  05/20/21 222 lb 14.4 oz (101.1 kg)   There is no height or weight on file to calculate BMI.    [name] GENERAL:alert, in no acute distress and comfortable SKIN: no acute rashes, no significant lesions EYES: conjunctiva are pink and non-injected, sclera anicteric OROPHARYNX: MMM, no exudates, no oropharyngeal erythema or ulceration NECK: supple, no JVD LYMPH:  no palpable lymphadenopathy in the cervical, axillary or inguinal regions LUNGS: clear to auscultation b/l with normal respiratory effort HEART: regular rate & rhythm ABDOMEN:  normoactive bowel sounds , non tender, not distended. Extremity: no pedal edema PSYCH: alert & oriented x 3 with fluent speech NEURO: no focal motor/sensory deficits   LABORATORY DATA:  I have reviewed the data as listed . CBC Latest Ref Rng & Units 09/16/2021 08/10/2021 07/15/2021  WBC 4.0 - 10.5 K/uL 2.7(L) 4.6 4.2  Hemoglobin 13.0 - 17.0 g/dL 9.4(L) 10.0(L) 10.1(L)  Hematocrit 39.0 - 52.0 % 31.1(L) 32.5(L) 31.7(L)  Platelets 150 - 400 K/uL 50(L) 94(L) 88(L)     . CMP Latest Ref Rng & Units 08/10/2021 07/15/2021 05/20/2021  Glucose 70 - 99 mg/dL 115(H) 100(H) 116(H)  BUN 8 - 23 mg/dL 35(H) 37(H) 43(H)  Creatinine 0.61 - 1.24 mg/dL 1.67(H) 1.87(H) 2.24(H)  Sodium 135 - 145 mmol/L 140 146(H) 143  Potassium 3.5 - 5.1 mmol/L 4.0 4.2 3.9  Chloride 98 - 111 mmol/L 108 111 107  CO2 22 - 32 mmol/L _0 Calcium 8.9 - 10.3 mg/dL 9.0 9.5 9.1  Total Protein 6.5 - 8.1 g/dL 7.2 6.8 6.9  Total Bilirubin 0.3 - 1.2 mg/dL 0.9 0.9 0.9  Alkaline Phos  38 - 126 U/L 82 75 75  AST 15 - 41 U/L _1 ALT 0 - 44 U/L _2 03/18/2019 MMP: Component     Latest Ref Rng & Units 03/18/2019  IgG (Immunoglobin G), Serum     603 - 1,613 mg/dL 1,012  IgA     61 - 437 mg/dL 51 (L)  IgM (Immunoglobulin M), Srm     15 - 143 mg/dL 27  Total Protein ELP     6.0 - 8.5 g/dL 6.3  Albumin SerPl Elph-Mcnc     2.9 - 4.4 g/dL 3.8  Alpha 1     0.0 - 0.4 g/dL 0.2  Alpha2 Glob SerPl Elph-Mcnc     0.4 - 1.0 g/dL 0.7  B-Globulin SerPl Elph-Mcnc     0.7 - 1.3 g/dL  0.8  Gamma Glob SerPl Elph-Mcnc     0.4 - 1.8 g/dL 0.8  M Protein SerPl Elph-Mcnc     Not Observed g/dL Not Observed  Globulin, Total     2.2 - 3.9 g/dL 2.5  Albumin/Glob SerPl     0.7 - 1.7 1.6  IFE 1      Comment  Please Note (HCV):      Comment    RADIOGRAPHIC STUDIES: I have personally reviewed the radiological images as listed and agreed with the findings in the report. No results found.  ASSESSMENT & PLAN:   75 y.o. 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   K/L 95, No M spike on SPEP -- suggests light chain MM Cytogenetics - normal male chromosomes FISH- +11 and 13q-/-13  Patient is s/p 1 cycle of Vd and Serum free kappa LC has decreased from 700 to 203.6 with improvement in his K/L ratio from 94.59 to 29. Completed 2nd cycle of treatment with Vd + Cytoxan ($RemoveBe'200mg'UvXCJILsC$ /m2) Completed 3rd cycle of treatment with Vd + Cytoxan ($RemoveBe'200mg'uthWFdxVI$ /m2) - cytoxan held D15 due  to thrombocytopenia Completed 4th of VCd Discontinued VCd after C5D8 due to intolerance with diarrhea and increasing neuropathy and fatigue with borderline functional status. -Patient did not tolerate maintenance Revlimid 10 mg by mouth daily due to grade 2-3 diarrhea. This was discontinued and his diarrhea has resolved.  Currently on maintenance Ninlaro M spike absent   11/15/18 CT Pelvic revealed  3 mm stone in the right UVJ. 5 x 6 mm stone is identified in the left UVJ. No associated distal hydroureter. 2. Stable lucent lesion in the right iliac bone. 3. Small right groin hernia contains only fat. 11/15/18 CT Lumbar revealed  No acute fracture or malalignment. 2. Severe osteopenia and multiple old compression fractures, severe at T11 and T12. 3. Severe canal stenosis L3-4. Multilevel neural foraminal narrowing: Moderate to severe at T12-L1. 4. Slowly enlarging solid LEFT renal mass. Recommend non emergent MRI renal mass protocol on non emergent basis -No obvious new myeloma involvement seen in most recent CT imaging.         2) CAD s/p prox LAD DES PCI on 07/31/2016 with ischemic cardiomyopathy ejection fraction of 25-35%. Rpt ECHO on 10/03/2016 shows improvement in EF to 55-60% with no RWMABN.   3) Macrocytic Anemia with moderate thrombocytopenia - due to MM and and Ninlaro. Stable.  4) Thrombocytopenia - due to MM and treatment -   5) B12 deficiency - Plan -cont SL B12 1044mcg po daily   6) h/o Afib with RVR on coumadin Some bradycardia with BB -continue mx per PCP and cardiology  7) Back pain -- multiple chronic compression fractures with some worsening -On bisphosphonates already -Ergocalciferol 50k units weekly -Advised that the pt not lift anything heavy  -Discussed the 11/15/18 CT imaging, as noted above, did refer pt to orthopedist -Pt has been evaluated by orthopedist in December 2020  PLAN:  -Discussed labs from 09/16/2021 with the patient.  CBC shows some increasing  anemia with hemoglobin down to 9.4, platelet  count is down to 50k and WBC count of 2.7k. CMP shows stable chronic kidney disease with a creatinine of 1.77 calcium is normal at 9.1 Myeloma panel shows no M spike and immunofixation pattern is unremarkable Serum free light chains show slight increase in kappa free light chains to 125 up from 104 with an increase in the kappa/lambda ratio from 4.8 up to 6.67 -We will need to repeat labs in about 2 weeks due to developing cytopenias to determine if any change in treatment is necessary -Continue Aredia q8weeks. -Continue weekly Ergocalciferol.  FOLLOW UP: Repeat labs in 12 to 14 days Phone visit with Dr. Irene Limbo in 16 days  . The total time spent in the appointment was 25 minutes and more than 50% was on counseling and direct patient cares.   All of the patient's questions were answered with apparent satisfaction. The patient knows to call the clinic with any problems, questions or concerns.   I,Zite Okoli,acting as a Education administrator for Sullivan Lone, MD.,have documented all relevant documentation on the behalf of Sullivan Lone, MD,as directed by  Sullivan Lone, MD while in the presence of Sullivan Lone, MD.    .I have reviewed the above documentation for accuracy and completeness, and I agree with the above.   Sullivan Lone MD [place] AAHIVMS [facility] Hematology/Oncology Physician [facility]

## 2021-09-17 LAB — KAPPA/LAMBDA LIGHT CHAINS
Kappa free light chain: 125.4 mg/L — ABNORMAL HIGH (ref 3.3–19.4)
Kappa, lambda light chain ratio: 6.67 — ABNORMAL HIGH (ref 0.26–1.65)
Lambda free light chains: 18.8 mg/L (ref 5.7–26.3)

## 2021-09-18 ENCOUNTER — Telehealth: Payer: Self-pay | Admitting: Hematology

## 2021-09-18 NOTE — Telephone Encounter (Signed)
Left message with follow-up appointments per 11/28 los. 

## 2021-09-19 ENCOUNTER — Other Ambulatory Visit: Payer: Self-pay | Admitting: Hematology

## 2021-09-19 ENCOUNTER — Other Ambulatory Visit (HOSPITAL_COMMUNITY): Payer: Self-pay

## 2021-09-19 DIAGNOSIS — C9000 Multiple myeloma not having achieved remission: Secondary | ICD-10-CM

## 2021-09-19 MED ORDER — IXAZOMIB CITRATE 3 MG PO CAPS
ORAL_CAPSULE | ORAL | 1 refills | Status: AC
  Filled 2021-09-19: qty 3, 28d supply, fill #0

## 2021-09-20 ENCOUNTER — Other Ambulatory Visit (HOSPITAL_COMMUNITY): Payer: Self-pay

## 2021-09-20 LAB — MULTIPLE MYELOMA PANEL, SERUM
Albumin SerPl Elph-Mcnc: 3.5 g/dL (ref 2.9–4.4)
Albumin/Glob SerPl: 1.4 (ref 0.7–1.7)
Alpha 1: 0.2 g/dL (ref 0.0–0.4)
Alpha2 Glob SerPl Elph-Mcnc: 0.6 g/dL (ref 0.4–1.0)
B-Globulin SerPl Elph-Mcnc: 0.8 g/dL (ref 0.7–1.3)
Gamma Glob SerPl Elph-Mcnc: 0.9 g/dL (ref 0.4–1.8)
Globulin, Total: 2.6 g/dL (ref 2.2–3.9)
IgA: 69 mg/dL (ref 61–437)
IgG (Immunoglobin G), Serum: 1079 mg/dL (ref 603–1613)
IgM (Immunoglobulin M), Srm: 18 mg/dL (ref 15–143)
Total Protein ELP: 6.1 g/dL (ref 6.0–8.5)

## 2021-09-22 ENCOUNTER — Encounter: Payer: Self-pay | Admitting: Hematology

## 2021-09-25 ENCOUNTER — Other Ambulatory Visit (HOSPITAL_COMMUNITY): Payer: Self-pay

## 2021-09-25 ENCOUNTER — Other Ambulatory Visit: Payer: Self-pay | Admitting: Hematology

## 2021-09-25 DIAGNOSIS — C9000 Multiple myeloma not having achieved remission: Secondary | ICD-10-CM

## 2021-10-01 ENCOUNTER — Other Ambulatory Visit: Payer: Self-pay

## 2021-10-01 ENCOUNTER — Inpatient Hospital Stay: Payer: Medicare HMO | Attending: Hematology

## 2021-10-01 DIAGNOSIS — E1122 Type 2 diabetes mellitus with diabetic chronic kidney disease: Secondary | ICD-10-CM | POA: Insufficient documentation

## 2021-10-01 DIAGNOSIS — N189 Chronic kidney disease, unspecified: Secondary | ICD-10-CM | POA: Insufficient documentation

## 2021-10-01 DIAGNOSIS — C9000 Multiple myeloma not having achieved remission: Secondary | ICD-10-CM | POA: Insufficient documentation

## 2021-10-01 DIAGNOSIS — I129 Hypertensive chronic kidney disease with stage 1 through stage 4 chronic kidney disease, or unspecified chronic kidney disease: Secondary | ICD-10-CM | POA: Insufficient documentation

## 2021-10-01 LAB — CBC WITH DIFFERENTIAL/PLATELET
Abs Immature Granulocytes: 0.02 10*3/uL (ref 0.00–0.07)
Basophils Absolute: 0 10*3/uL (ref 0.0–0.1)
Basophils Relative: 1 %
Eosinophils Absolute: 0 10*3/uL (ref 0.0–0.5)
Eosinophils Relative: 1 %
HCT: 31.1 % — ABNORMAL LOW (ref 39.0–52.0)
Hemoglobin: 9.8 g/dL — ABNORMAL LOW (ref 13.0–17.0)
Immature Granulocytes: 1 %
Lymphocytes Relative: 11 %
Lymphs Abs: 0.4 10*3/uL — ABNORMAL LOW (ref 0.7–4.0)
MCH: 29.6 pg (ref 26.0–34.0)
MCHC: 31.5 g/dL (ref 30.0–36.0)
MCV: 94 fL (ref 80.0–100.0)
Monocytes Absolute: 0.4 10*3/uL (ref 0.1–1.0)
Monocytes Relative: 11 %
Neutro Abs: 2.6 10*3/uL (ref 1.7–7.7)
Neutrophils Relative %: 75 %
Platelets: 79 10*3/uL — ABNORMAL LOW (ref 150–400)
RBC: 3.31 MIL/uL — ABNORMAL LOW (ref 4.22–5.81)
RDW: 17 % — ABNORMAL HIGH (ref 11.5–15.5)
WBC: 3.4 10*3/uL — ABNORMAL LOW (ref 4.0–10.5)
nRBC: 0 % (ref 0.0–0.2)

## 2021-10-01 LAB — CMP (CANCER CENTER ONLY)
ALT: 12 U/L (ref 0–44)
AST: 20 U/L (ref 15–41)
Albumin: 3.8 g/dL (ref 3.5–5.0)
Alkaline Phosphatase: 82 U/L (ref 38–126)
Anion gap: 8 (ref 5–15)
BUN: 35 mg/dL — ABNORMAL HIGH (ref 8–23)
CO2: 25 mmol/L (ref 22–32)
Calcium: 9 mg/dL (ref 8.9–10.3)
Chloride: 110 mmol/L (ref 98–111)
Creatinine: 1.72 mg/dL — ABNORMAL HIGH (ref 0.61–1.24)
GFR, Estimated: 41 mL/min — ABNORMAL LOW (ref >60)
Glucose, Bld: 99 mg/dL (ref 70–99)
Potassium: 4.4 mmol/L (ref 3.5–5.1)
Sodium: 143 mmol/L (ref 135–145)
Total Bilirubin: 0.9 mg/dL (ref 0.3–1.2)
Total Protein: 6.7 g/dL (ref 6.5–8.1)

## 2021-10-01 LAB — IRON AND TIBC
Iron: 70 ug/dL (ref 42–163)
Saturation Ratios: 17 % — ABNORMAL LOW (ref 20–55)
TIBC: 409 ug/dL (ref 202–409)
UIBC: 339 ug/dL (ref 117–376)

## 2021-10-01 LAB — FERRITIN: Ferritin: 49 ng/mL (ref 24–336)

## 2021-10-01 LAB — VITAMIN B12: Vitamin B-12: 515 pg/mL (ref 180–914)

## 2021-10-02 LAB — KAPPA/LAMBDA LIGHT CHAINS
Kappa free light chain: 124.5 mg/L — ABNORMAL HIGH (ref 3.3–19.4)
Kappa, lambda light chain ratio: 6.48 — ABNORMAL HIGH (ref 0.26–1.65)
Lambda free light chains: 19.2 mg/L (ref 5.7–26.3)

## 2021-10-02 LAB — FOLATE RBC
Folate, Hemolysate: 323 ng/mL
Folate, RBC: 1019 ng/mL (ref >498)
Hematocrit: 31.7 % — ABNORMAL LOW (ref 37.5–51.0)

## 2021-10-03 ENCOUNTER — Inpatient Hospital Stay (HOSPITAL_BASED_OUTPATIENT_CLINIC_OR_DEPARTMENT_OTHER): Payer: Medicare HMO | Admitting: Hematology

## 2021-10-03 DIAGNOSIS — D696 Thrombocytopenia, unspecified: Secondary | ICD-10-CM

## 2021-10-03 DIAGNOSIS — C9000 Multiple myeloma not having achieved remission: Secondary | ICD-10-CM | POA: Diagnosis not present

## 2021-10-03 DIAGNOSIS — D709 Neutropenia, unspecified: Secondary | ICD-10-CM | POA: Diagnosis not present

## 2021-10-03 MED ORDER — B COMPLEX VITAMINS PO CAPS: 1.0000 | ORAL_CAPSULE | Freq: Every day | ORAL | 3 refills | Status: AC

## 2021-10-03 MED ORDER — POLYSACCHARIDE IRON COMPLEX 150 MG PO CAPS: 150.0000 mg | ORAL_CAPSULE | Freq: Every day | ORAL | 4 refills | Status: AC

## 2021-10-04 ENCOUNTER — Ambulatory Visit: Payer: Medicare HMO | Admitting: Cardiovascular Disease

## 2021-10-04 ENCOUNTER — Other Ambulatory Visit: Payer: Self-pay

## 2021-10-04 ENCOUNTER — Encounter: Payer: Self-pay | Admitting: Cardiovascular Disease

## 2021-10-04 DIAGNOSIS — R0609 Other forms of dyspnea: Secondary | ICD-10-CM | POA: Diagnosis not present

## 2021-10-04 DIAGNOSIS — I10 Essential (primary) hypertension: Secondary | ICD-10-CM

## 2021-10-04 DIAGNOSIS — E785 Hyperlipidemia, unspecified: Secondary | ICD-10-CM

## 2021-10-04 DIAGNOSIS — I482 Chronic atrial fibrillation, unspecified: Secondary | ICD-10-CM

## 2021-10-04 DIAGNOSIS — I2510 Atherosclerotic heart disease of native coronary artery without angina pectoris: Secondary | ICD-10-CM

## 2021-10-04 DIAGNOSIS — Z9861 Coronary angioplasty status: Secondary | ICD-10-CM

## 2021-10-04 NOTE — Assessment & Plan Note (Signed)
History of chronic A. fib rate controlled on Coumadin anticoagulation.  We discussed transitioning to Eliquis.

## 2021-10-04 NOTE — Progress Notes (Signed)
10/04/2021 Richard Vang   1946-06-24  [id number]  Primary Physician Richard Mariscal, MD Primary Cardiologist: Lorretta Harp MD [name], [place]  HPI:  Richard Vang is a 75 y.o.  moderate to severely overweight married Caucasian male father of 2 living children (1 deceased), grandfather of 5 grandchildren referred by Dr. Claudie Leach initially for cardiac catheterization. He is retired working part-time at [employer] .  I last saw him in the office 03/12/2021.  He is accompanied by his wife [name] today. His risk factors include treated hypertension, diabetes and hyperlipidemia. His father did die of a myocardial infarction at age 9. He has never had a heart attack or stroke.  He was having increasing dyspnea on exertion for the last 4-6 week prior to seeing me.  A Myoview stress test showed LV dysfunction with an EF of 40% and mild anterior ischemia.  I performed cardiac catheterization on him 07/31/2016 revealing a high-grade proximal LAD stenosis which had stented using a drug-eluting stent.  His symptoms ultimately improved although I never saw him back after that.  He also has chronic atrial fibrillation on Coumadin anticoagulation.   He was admitted in August 5366 with diastolic heart failure was diuresed.  He is now on daily Lasix and is watching his salt intake.    He saw Kerin Ransom in the office 11/08/2020 after he had a 2D echo that revealed preserved LV function and a Myoview stress test that showed no ischemia.  His Lasix was uptitrated and he was clinically improved.   Since I saw him in the office 6 months ago he has remained stable.  He continues to complain of chronic shortness of breath which I believe is related to diastolic dysfunction.  He denies chest pain.  His serum creatinine is in the 2 range on furosemide for diastolic dysfunction.   Current Meds  Medication Sig   acyclovir (ZOVIRAX) 400 MG tablet Take 1 tablet by mouth twice daily   amLODipine (NORVASC) 5 MG  tablet Take 5 mg by mouth daily.   atorvastatin (LIPITOR) 40 MG tablet Take 1 tablet (40 mg total) by mouth at bedtime.   b complex vitamins capsule Take 1 capsule by mouth daily.   carvedilol (COREG) 12.5 MG tablet Take 12.5 mg by mouth 2 (two) times daily with a meal.   clopidogrel (PLAVIX) 75 MG tablet Take 1 tablet (75 mg total) by mouth daily with breakfast.   cyclobenzaprine (FLEXERIL) 10 MG tablet Take 10 mg by mouth daily as needed for muscle spasms.    escitalopram (LEXAPRO) 10 MG tablet Take 10 mg by mouth daily.   furosemide (LASIX) 80 MG tablet Take 1 tablet (80 mg total) by mouth in the morning AND 0.5 tablets (40 mg total) every evening.   iron polysaccharides (NIFEREX) 150 MG capsule Take 1 capsule (150 mg total) by mouth daily.   isosorbide mononitrate (IMDUR) 30 MG 24 hr tablet Take 1 tablet (30 mg total) by mouth daily.   ixazomib citrate (NINLARO) 3 MG capsule TAKE 1 CAPSULE (3 MG TOTAL) BY MOUTH ONCE A WEEK. ON DAY 1,8,15 EVERY 28 DAYS. TAKE ON AN EMPTY STOMACH 1HR BEFORE OR 2HRS AFTER FOOD.   montelukast (SINGULAIR) 10 MG tablet Take 10 mg by mouth daily.   omeprazole (PRILOSEC) 40 MG capsule Take 40 mg by mouth daily with breakfast.    oxyCODONE-acetaminophen (PERCOCET) 10-325 MG tablet Take 1 tablet by mouth 2 (two) times daily.   Potassium Chloride  CR (MICRO-K) 8 MEQ CPCR capsule CR Take 8 mEq by mouth daily.   SYMBICORT 160-4.5 MCG/ACT inhaler Inhale 2 puffs into the lungs 2 (two) times daily as needed (shortness of breath).    Vitamin D, Ergocalciferol, (DRISDOL) 1.25 MG (50000 UNIT) CAPS capsule Take 1 capsule by mouth once a week   warfarin (COUMADIN) 5 MG tablet Take 5 mg by mouth daily.     No Known Allergies  Social History   Socioeconomic History   Marital status: Divorced    Spouse name: Not on file   Number of children: Not on file   Years of education: Not on file   Highest education level: Not on file  Occupational History   Not on file  Tobacco Use    Smoking status: Never   Smokeless tobacco: Never  Vaping Use   Vaping Use: Never used  Substance and Sexual Activity   Alcohol use: Not Currently    Comment: 07/31/2016 "nothing since 2002"   Drug use: No   Sexual activity: Not Currently  Other Topics Concern   Not on file  Social History Narrative   Not on file   Social Determinants of Health   Financial Resource Strain: Low Risk    Difficulty of Paying Living Expenses: Not hard at all  Food Insecurity: No Food Insecurity   Worried About Charity fundraiser in the Last Year: Never true   [place] in the Last Year: Never true  Transportation Needs: No Transportation Needs   Lack of Transportation (Medical): No   Lack of Transportation (Non-Medical): No  Physical Activity: Inactive   Days of Exercise per Week: 0 days   Minutes of Exercise per Session: 0 min  Stress: Not on file  Social Connections: Moderately Integrated   Frequency of Communication with Friends and Family: More than three times a week   Frequency of Social Gatherings with Friends and Family: More than three times a week   Attends Religious Services: Never   Marine scientist or Organizations: Yes   Attends Archivist Meetings: Never   Marital Status: Living with partner  Intimate Partner Violence: Not At Risk   Fear of Current or Ex-Partner: No   Emotionally Abused: No   Physically Abused: No   Sexually Abused: No     Review of Systems: General: negative for chills, fever, night sweats or weight changes.  Cardiovascular: negative for chest pain, dyspnea on exertion, edema, orthopnea, palpitations, paroxysmal nocturnal dyspnea or shortness of breath Dermatological: negative for rash Respiratory: negative for cough or wheezing Urologic: negative for hematuria Abdominal: negative for nausea, vomiting, diarrhea, bright red blood per rectum, melena, or hematemesis Neurologic: negative for visual changes, syncope, or dizziness All  other systems reviewed and are otherwise negative except as noted above.    Blood pressure 118/86, pulse 61, height 5\' 9"  (1.753 m), weight 216 lb 12.8 oz (98.3 kg), SpO2 99 %.  General appearance: alert and no distress Neck: no adenopathy, no carotid bruit, no JVD, supple, symmetrical, trachea midline, and thyroid not enlarged, symmetric, no tenderness/mass/nodules Lungs: clear to auscultation bilaterally Heart: irregularly irregular rhythm Extremities: extremities normal, atraumatic, no cyanosis or edema Pulses: 2+ and symmetric Skin: Skin color, texture, turgor normal. No rashes or lesions Neurologic: Grossly normal  EKG atrial fibrillation ventricular sponsor 61, anteroseptal Q waves.  I personally reviewed this EKG.  ASSESSMENT AND PLAN:   Essential hypertension History of essential hypertension a blood pressure measured today 118/86.  He is on amlodipine, and carvedilol.  Chronic atrial fibrillation History of chronic A. fib rate controlled on Coumadin anticoagulation.  We discussed transitioning to Eliquis.  Dyspnea on exertion Chronic dyspnea on exertion unchanged since last office visit.  CAD S/P percutaneous coronary angioplasty History of CAD status post cardiac catheterization performed 07/31/2016 by myself revealing a high-grade proximal LAD stenosis which I stented using drug-eluting stent.  His symptoms after that improved.  He currently denies chest pain.  Dyslipidemia, goal LDL below 70 History of dyslipidemia on statin therapy with lipid profile performed 08/23/2019 revealing total cholesterol 100, LDL 37 and HDL 46.     Lorretta Harp MD [facility], [facility] 10/04/2021 10:38 AM

## 2021-10-04 NOTE — Assessment & Plan Note (Signed)
History of dyslipidemia on statin therapy with lipid profile performed 08/23/2019 revealing total cholesterol 100, LDL 37 and HDL 46.

## 2021-10-04 NOTE — Assessment & Plan Note (Signed)
History of essential hypertension a blood pressure measured today 118/86.  He is on amlodipine, and carvedilol.

## 2021-10-04 NOTE — Assessment & Plan Note (Signed)
Chronic dyspnea on exertion unchanged since last office visit.

## 2021-10-04 NOTE — Assessment & Plan Note (Signed)
History of CAD status post cardiac catheterization performed 07/31/2016 by myself revealing a high-grade proximal LAD stenosis which I stented using drug-eluting stent.  His symptoms after that improved.  He currently denies chest pain.

## 2021-10-04 NOTE — Patient Instructions (Signed)
Medication Instructions:  Your physician recommends that you continue on your current medications as directed. Please refer to the Current Medication list given to you today.   *If you need a refill on your cardiac medications before your next appointment, please call your pharmacy*   Follow-Up: At [facility], you and your health needs are our priority.  As part of our continuing mission to provide you with exceptional heart care, we have created designated Provider Care Teams.  These Care Teams include your primary Cardiologist (physician) and Advanced Practice Providers (APPs -  Physician Assistants and Nurse Practitioners) who all work together to provide you with the care you need, when you need it.  We recommend signing up for the patient portal called "MyChart".  Sign up information is provided on this After Visit Summary.  MyChart is used to connect with patients for Virtual Visits (Telemedicine).  Patients are able to view lab/test results, encounter notes, upcoming appointments, etc.  Non-urgent messages can be sent to your provider as well.   To learn more about what you can do with MyChart, go to [email or web address].    Your next appointment:   6 month(s)  The format for your next appointment:   In Person  Provider:   Coletta Memos, FNP      Then, Quay Burow, MD will plan to see you again in 12 month(s).  Other Instructions We will set up an appointment for you to see a PharmD in regards to switch to [place].

## 2021-10-07 ENCOUNTER — Other Ambulatory Visit: Payer: Self-pay

## 2021-10-07 LAB — MULTIPLE MYELOMA PANEL, SERUM
Albumin SerPl Elph-Mcnc: 3.6 g/dL (ref 2.9–4.4)
Albumin/Glob SerPl: 1.4 (ref 0.7–1.7)
Alpha 1: 0.2 g/dL (ref 0.0–0.4)
Alpha2 Glob SerPl Elph-Mcnc: 0.6 g/dL (ref 0.4–1.0)
B-Globulin SerPl Elph-Mcnc: 0.9 g/dL (ref 0.7–1.3)
Gamma Glob SerPl Elph-Mcnc: 1 g/dL (ref 0.4–1.8)
Globulin, Total: 2.7 g/dL (ref 2.2–3.9)
IgA: 65 mg/dL (ref 61–437)
IgG (Immunoglobin G), Serum: 1098 mg/dL (ref 603–1613)
IgM (Immunoglobulin M), Srm: 20 mg/dL (ref 15–143)
Total Protein ELP: 6.3 g/dL (ref 6.0–8.5)

## 2021-10-08 ENCOUNTER — Other Ambulatory Visit: Payer: Self-pay

## 2021-10-08 MED ORDER — ISOSORBIDE MONONITRATE ER 30 MG PO TB24: 30.0000 mg | ORAL_TABLET | Freq: Every day | ORAL | 1 refills | Status: AC

## 2021-10-17 ENCOUNTER — Other Ambulatory Visit (HOSPITAL_COMMUNITY): Payer: Self-pay

## 2021-10-18 ENCOUNTER — Telehealth: Payer: Self-pay

## 2021-10-18 ENCOUNTER — Ambulatory Visit: Payer: Medicare HMO

## 2021-10-20 NOTE — Telephone Encounter (Signed)
I called and asked to r/s appt when I was informed that he died this morning. I will route to frances worley to close out the chart.

## 2021-10-20 DEATH — deceased

## 2021-10-21 ENCOUNTER — Other Ambulatory Visit (HOSPITAL_COMMUNITY): Payer: Self-pay

## 2021-10-21 ENCOUNTER — Encounter: Payer: Self-pay | Admitting: Hematology

## 2021-10-21 NOTE — Progress Notes (Signed)
HEMATOLOGY/ONCOLOGY CLINIC NOTE  Date of Service: .10/03/2021  Patient Care Team: Sandi Mariscal, MD as PCP - General (Internal Medicine) Lorretta Harp, MD as PCP - Cardiology (Cardiology)  CHIEF COMPLAINTS/PURPOSE OF CONSULTATION:   Phone visit for lab review for multiple myeloma  HISTORY OF PRESENTING ILLNESS:  plz see previous note for details on HPI  INTERVAL HISTORY:  .I connected with Juliette Alcide on 10/21/21 at 10:00 AM EST by telephone visit and verified that I am speaking with the correct person using two identifiers.   I discussed the limitations, risks, security and privacy concerns of performing an evaluation and management service by telemedicine and the availability of in-person appointments. I also discussed with the patient that there may be a patient responsible charge related to this service. The patient expressed understanding and agreed to proceed.   Other persons participating in the visit and their role in the encounter: None  Patients location:: Home Providers location: [place]  Chief Complaint: Lab review for multiple myeloma  Patient was called to discuss his labs done on 10/01/2021 since his previous labs showed worsening anemia and thrombocytopenia. CBC showed improvement in his platelet counts to 79k up from 50k hemoglobin stable at 9.8, WBC count improved from 2.7k to 3.4k.  CMP stable with chronic kidney disease with creatinine of 1.72 B12 within normal limits at 515 Ferritin 49 with iron saturation of 17% RBC folate within normal limits MEDICAL HISTORY:  Past Medical History:  Diagnosis Date   Arthritis    "left shoulder" (07/31/2016)   Coronary artery disease    GERD (gastroesophageal reflux disease)    Heart murmur    High cholesterol    Hypertension    Multiple myeloma ([place])    Sleep apnea    "probably; having test in November" (07/31/2016)   Type II diabetes mellitus ([place])     SURGICAL HISTORY: Past Surgical History:  Procedure Laterality Date   CARDIAC CATHETERIZATION N/A 07/31/2016   Procedure: Left Heart Cath and Coronary Angiography;  Surgeon: Lorretta Harp, MD;  Location: [facility];  Service: Cardiovascular;  Laterality: N/A;   CARDIAC CATHETERIZATION N/A 07/31/2016   Procedure: Coronary Stent Intervention;  Surgeon: Lorretta Harp, MD;  Location: [facility];  Service: Cardiovascular;  Laterality: N/A;   CORONARY ANGIOPLASTY     OPEN REDUCTION INTERNAL FIXATION (ORIF) METACARPAL Left 05/21/2018   Procedure: OPEN REDUCTION INTERNAL FIXATION (ORIF) 2ND METACARPAL;  Surgeon: Shona Needles, MD;  Location: [facility];  Service: Orthopedics;  Laterality: Left;   ORIF CLAVICULAR FRACTURE Right 05/21/2018   Procedure: OPEN REDUCTION INTERNAL FIXATION (ORIF) CLAVICULAR FRACTURE;  Surgeon: Shona Needles, MD;  Location: [facility];  Service: Orthopedics;  Laterality: Right;   SHOULDER SURGERY Left 1973   "put pin in it where it had separated"    TONSILLECTOMY  ~ 1956    SOCIAL HISTORY: Social History    Socioeconomic History   Marital status: Divorced    Spouse name: Not on file   Number of children: Not on file   Years of education: Not on file   Highest education level: Not on file  Occupational History   Not on file  Tobacco Use   Smoking status: Never   Smokeless tobacco: Never  Vaping Use   Vaping Use: Never used  Substance and Sexual Activity   Alcohol use: Not Currently    Comment: 07/31/2016 "nothing since 2002"   Drug use: No  Sexual activity: Not Currently  Other Topics Concern   Not on file  Social History Narrative   Not on file   Social Determinants of Health   Financial Resource Strain: Low Risk    Difficulty of Paying Living Expenses: Not hard at all  Food Insecurity: No Food Insecurity   Worried About [place] in the Last Year: Never true   [place] in the Last Year: Never true  Transportation Needs: No Transportation Needs   Lack of Transportation (Medical): No   Lack of Transportation (Non-Medical): No  Physical Activity: Inactive   Days of Exercise per Week: 0 days   Minutes of Exercise per Session: 0 min  Stress: Not on file  Social Connections: Moderately Integrated   Frequency of Communication with Friends and Family: More than three times a week   Frequency of Social Gatherings with Friends and Family: More than three times a week   Attends Religious Services: Never   Marine scientist or Organizations: Yes   Attends Archivist Meetings: Never   Marital Status: Living with partner  Intimate Partner Violence: Not At Risk   Fear of Current or Ex-Partner: No   Emotionally Abused: No   Physically Abused: No   Sexually Abused: No    FAMILY HISTORY: Family History  Problem Relation Age of Onset   Hypertension Other    Lung cancer Mother     ALLERGIES:  has No Known Allergies.  MEDICATIONS:  Current Outpatient Medications  Medication Sig Dispense Refill   b complex vitamins capsule Take 1 capsule by  mouth daily. 30 capsule 3   iron polysaccharides (NIFEREX) 150 MG capsule Take 1 capsule (150 mg total) by mouth daily. 30 capsule 4   acyclovir (ZOVIRAX) 400 MG tablet Take 1 tablet by mouth twice daily 60 tablet 0   amLODipine (NORVASC) 5 MG tablet Take 5 mg by mouth daily.     atorvastatin (LIPITOR) 40 MG tablet Take 1 tablet (40 mg total) by mouth at bedtime. 30 tablet 12   carvedilol (COREG) 12.5 MG tablet Take 12.5 mg by mouth 2 (two) times daily with a meal.     clopidogrel (PLAVIX) 75 MG tablet Take 1 tablet (75 mg total) by mouth daily with breakfast. 30 tablet 12   cyclobenzaprine (FLEXERIL) 10 MG tablet Take 10 mg by mouth daily as needed for muscle spasms.   2   escitalopram (LEXAPRO) 10 MG tablet Take 10 mg by mouth daily.     furosemide (LASIX) 80 MG tablet Take 1 tablet (80 mg total) by mouth in the morning AND 0.5 tablets (40 mg total) every evening. 135 tablet 3   isosorbide mononitrate (IMDUR) 30 MG 24 hr tablet Take 1 tablet (30 mg total) by mouth daily. 90 tablet 1   ixazomib citrate (NINLARO) 3 MG capsule TAKE 1 CAPSULE (3 MG TOTAL) BY MOUTH ONCE A WEEK. ON DAY 1,8,15 EVERY 28 DAYS. TAKE ON AN EMPTY STOMACH 1HR BEFORE OR 2HRS AFTER FOOD. 3 capsule 1   montelukast (SINGULAIR) 10 MG tablet Take 10 mg by mouth daily.     omeprazole (PRILOSEC) 40 MG capsule Take 40 mg by mouth daily with breakfast.      oxyCODONE-acetaminophen (PERCOCET) 10-325 MG tablet Take 1 tablet by mouth 2 (two) times daily. 10 tablet 0   Potassium Chloride CR (MICRO-K) 8 MEQ CPCR capsule CR Take 8 mEq by mouth daily.     SYMBICORT 160-4.5 MCG/ACT inhaler Inhale  2 puffs into the lungs 2 (two) times daily as needed (shortness of breath).      Vitamin D, Ergocalciferol, (DRISDOL) 1.25 MG (50000 UNIT) CAPS capsule Take 1 capsule by mouth once a week 12 capsule 0   warfarin (COUMADIN) 5 MG tablet Take 5 mg by mouth daily.     No current facility-administered medications for this visit.   Facility-Administered  Medications Ordered in Other Visits  Medication Dose Route Frequency Provider Last Rate Last Admin   0.9 %  sodium chloride infusion   Intravenous Continuous Brunetta Genera, MD 10 mL/hr at 07/09/17 1423 New Bag at 07/09/17 1423    REVIEW OF SYSTEMS:   No new bone pain, fevers, chills, night sweats. No new chest pain or shortness of breath.  PHYSICAL EXAMINATION:  Telemedicine visit   LABORATORY DATA:  I have reviewed the data as listed . CBC Latest Ref Rng & Units 10/01/2021 10/01/2021 09/16/2021  WBC 4.0 - 10.5 K/uL 3.4(L) - 2.7(L)  Hemoglobin 13.0 - 17.0 g/dL 9.8(L) - 9.4(L)  Hematocrit 37.5 - 51.0 % 31.1(L) 31.7(L) 31.1(L)  Platelets 150 - 400 K/uL 79(L) - 50(L)     . CMP Latest Ref Rng & Units 10/01/2021 09/16/2021 08/10/2021  Glucose 70 - 99 mg/dL 99 99 115(H)  BUN 8 - 23 mg/dL 35(H) 30(H) 35(H)  Creatinine 0.61 - 1.24 mg/dL 1.72(H) 1.77(H) 1.67(H)  Sodium 135 - 145 mmol/L 143 144 140  Potassium 3.5 - 5.1 mmol/L 4.4 3.7 4.0  Chloride 98 - 111 mmol/L 110 110 108  CO2 22 - 32 mmol/L '25 24 25  ' Calcium 8.9 - 10.3 mg/dL 9.0 9.1 9.0  Total Protein 6.5 - 8.1 g/dL 6.7 6.6 7.2  Total Bilirubin 0.3 - 1.2 mg/dL 0.9 1.1 0.9  Alkaline Phos 38 - 126 U/L 82 77 82  AST 15 - 41 U/L '20 21 24  ' ALT 0 - 44 U/L '12 13 14   ' . Lab Results  Component Value Date   IRON 70 10/01/2021   TIBC 409 10/01/2021   IRONPCTSAT 17 (L) 10/01/2021   (Iron and TIBC)  Lab Results  Component Value Date   FERRITIN 49 10/01/2021    03/18/2019 MMP: Component     Latest Ref Rng & Units 03/18/2019  IgG (Immunoglobin G), Serum     603 - 1,613 mg/dL 1,012  IgA     61 - 437 mg/dL 51 (L)  IgM (Immunoglobulin M), Srm     15 - 143 mg/dL 27  Total Protein ELP     6.0 - 8.5 g/dL 6.3  Albumin SerPl Elph-Mcnc     2.9 - 4.4 g/dL 3.8  Alpha 1     0.0 - 0.4 g/dL 0.2  Alpha2 Glob SerPl Elph-Mcnc     0.4 - 1.0 g/dL 0.7  B-Globulin SerPl Elph-Mcnc     0.7 - 1.3 g/dL 0.8  Gamma Glob SerPl  Elph-Mcnc     0.4 - 1.8 g/dL 0.8  M Protein SerPl Elph-Mcnc     Not Observed g/dL Not Observed  Globulin, Total     2.2 - 3.9 g/dL 2.5  Albumin/Glob SerPl     0.7 - 1.7 1.6  IFE 1      Comment  Please Note (HCV):      Comment    RADIOGRAPHIC STUDIES: I have personally reviewed the radiological images as listed and agreed with the findings in the report. No results found.  ASSESSMENT & PLAN:   76 y.o. 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   K/L 95, No M spike on SPEP -- suggests light chain MM Cytogenetics - normal male chromosomes FISH- +11 and 13q-/-13  Patient is s/p 1 cycle of Vd and Serum free kappa LC has decreased from 700 to 203.6 with improvement in his K/L ratio from 94.59 to 29. Completed 2nd cycle of treatment with Vd + Cytoxan (274m/m2) Completed 3rd cycle of treatment with Vd + Cytoxan (2082mm2) - cytoxan held D15 due to thrombocytopenia Completed 4th of VCd Discontinued VCd after C5D8 due to intolerance with diarrhea and increasing neuropathy and fatigue with borderline functional status. -Patient did not tolerate maintenance Revlimid 10 mg by mouth daily due to grade 2-3 diarrhea. This was discontinued and his diarrhea has resolved.  Currently on maintenance Ninlaro M spike absent   11/15/18 CT Pelvic revealed  3 mm stone in the right UVJ. 5 x 6 mm stone is identified in the left UVJ. No associated distal hydroureter. 2. Stable lucent lesion  in the right iliac bone. 3. Small right groin hernia contains only fat. 11/15/18 CT Lumbar revealed  No acute fracture or malalignment. 2. Severe osteopenia and multiple old compression fractures, severe at T11 and T12. 3. Severe canal stenosis L3-4. Multilevel neural foraminal narrowing: Moderate to severe at T12-L1. 4. Slowly enlarging solid LEFT renal mass. Recommend non emergent MRI renal mass protocol on non emergent basis -No obvious new myeloma involvement seen in most recent CT imaging.         2) CAD s/p prox LAD DES PCI on 07/31/2016 with ischemic cardiomyopathy ejection fraction of 25-35%. Rpt ECHO on 10/03/2016 shows improvement in EF to 55-60% with no RWMABN.   3) Macrocytic Anemia with moderate thrombocytopenia - due to MM and and Ninlaro. Stable.  4) Thrombocytopenia - due to MM and treatment -   5) B12 deficiency - Plan -cont SL B12 100035mpo daily   6) h/o Afib with RVR on coumadin Some bradycardia with BB -continue mx per PCP and cardiology  7) Back pain -- multiple chronic compression fractures with some worsening -On bisphosphonates already -Ergocalciferol 50k units weekly -Advised that the pt not lift anything heavy  -Discussed the 11/15/18 CT imaging, as noted above, did refer pt to orthopedist -Pt has been evaluated by orthopedist in December 2020  PLAN:  -Labs done on 10/01/2021 reviewed with the patient as noted above.  Improved platelets and WBC count.  Hemoglobin stable.   -Myeloma panel still shows elevated kappa light chains.  We will continue to monitor. -In the context of chronic kidney disease the patient's ferritin level would need to be targeted to be more than 100.  He was recommended to start taking oral iron polysaccharide 150 mg p.o. daily over-the-counter. -No indication for EPO at this time. -Continue current dose of Ninlaro with close monitoring to look for worsening cytopenias. -Continue weekly ergocalciferol  FOLLOW UP: Follow-up as per next  scheduled appointment on 11/11/2021   All of the patient's questions were answered with apparent satisfaction. The patient knows to call the clinic  with any problems, questions or concerns.  Sullivan Lone MD [place] AAHIVMS [facility] [place] Hematology/Oncology Physician [facility]

## 2021-10-23 NOTE — Progress Notes (Signed)
Pt's wife called to report that pt had recently died.

## 2021-10-28 ENCOUNTER — Ambulatory Visit (HOSPITAL_COMMUNITY): Payer: Medicare HMO

## 2021-11-11 ENCOUNTER — Ambulatory Visit: Payer: Medicare HMO | Admitting: Hematology

## 2021-11-11 ENCOUNTER — Ambulatory Visit: Payer: Medicare HMO

## 2021-11-11 ENCOUNTER — Other Ambulatory Visit: Payer: Medicare HMO

## 2021-11-12 ENCOUNTER — Other Ambulatory Visit (HOSPITAL_COMMUNITY): Payer: Medicare HMO

## 2022-03-19 ENCOUNTER — Ambulatory Visit: Payer: Medicare HMO | Admitting: Physician Assistant

## 2022-03-19 ENCOUNTER — Ambulatory Visit: Payer: Medicare HMO | Admitting: General Practice
